# Patient Record
Sex: Female | Born: 1950 | Race: White | Hispanic: No | Marital: Married | Smoking: Never smoker
Health system: Southern US, Community
[De-identification: ages and names within clinical notes are randomized; demographics above are authoritative.]

## PROBLEM LIST (undated history)

## (undated) DIAGNOSIS — Z9221 Personal history of antineoplastic chemotherapy: Secondary | ICD-10-CM

## (undated) DIAGNOSIS — Z923 Personal history of irradiation: Secondary | ICD-10-CM

## (undated) DIAGNOSIS — Z9289 Personal history of other medical treatment: Secondary | ICD-10-CM

## (undated) DIAGNOSIS — K611 Rectal abscess: Secondary | ICD-10-CM

## (undated) DIAGNOSIS — H269 Unspecified cataract: Secondary | ICD-10-CM

## (undated) DIAGNOSIS — E119 Type 2 diabetes mellitus without complications: Secondary | ICD-10-CM

## (undated) DIAGNOSIS — R2689 Other abnormalities of gait and mobility: Secondary | ICD-10-CM

## (undated) DIAGNOSIS — I219 Acute myocardial infarction, unspecified: Secondary | ICD-10-CM

## (undated) DIAGNOSIS — R55 Syncope and collapse: Secondary | ICD-10-CM

## (undated) DIAGNOSIS — J90 Pleural effusion, not elsewhere classified: Secondary | ICD-10-CM

## (undated) DIAGNOSIS — G250 Essential tremor: Secondary | ICD-10-CM

## (undated) DIAGNOSIS — M1990 Unspecified osteoarthritis, unspecified site: Secondary | ICD-10-CM

## (undated) DIAGNOSIS — N390 Urinary tract infection, site not specified: Secondary | ICD-10-CM

## (undated) DIAGNOSIS — E7800 Pure hypercholesterolemia, unspecified: Secondary | ICD-10-CM

## (undated) DIAGNOSIS — I10 Essential (primary) hypertension: Secondary | ICD-10-CM

## (undated) DIAGNOSIS — H53459 Other localized visual field defect, unspecified eye: Secondary | ICD-10-CM

## (undated) DIAGNOSIS — J189 Pneumonia, unspecified organism: Secondary | ICD-10-CM

## (undated) DIAGNOSIS — N2889 Other specified disorders of kidney and ureter: Secondary | ICD-10-CM

## (undated) DIAGNOSIS — C50919 Malignant neoplasm of unspecified site of unspecified female breast: Secondary | ICD-10-CM

## (undated) DIAGNOSIS — G4700 Insomnia, unspecified: Secondary | ICD-10-CM

## (undated) DIAGNOSIS — I519 Heart disease, unspecified: Secondary | ICD-10-CM

## (undated) DIAGNOSIS — I2510 Atherosclerotic heart disease of native coronary artery without angina pectoris: Secondary | ICD-10-CM

## (undated) DIAGNOSIS — I701 Atherosclerosis of renal artery: Secondary | ICD-10-CM

## (undated) DIAGNOSIS — N870 Mild cervical dysplasia: Secondary | ICD-10-CM

## (undated) DIAGNOSIS — K219 Gastro-esophageal reflux disease without esophagitis: Secondary | ICD-10-CM

## (undated) DIAGNOSIS — I639 Cerebral infarction, unspecified: Secondary | ICD-10-CM

## (undated) DIAGNOSIS — I509 Heart failure, unspecified: Secondary | ICD-10-CM

## (undated) HISTORY — DX: Other specified disorders of kidney and ureter: N28.89

## (undated) HISTORY — DX: Atherosclerosis of renal artery: I70.1

## (undated) HISTORY — PX: CERVICAL BIOPSY  W/ LOOP ELECTRODE EXCISION: SUR135

## (undated) HISTORY — DX: Essential tremor: G25.0

## (undated) HISTORY — PX: CATARACT EXTRACTION: SUR2

## (undated) HISTORY — PX: BACK SURGERY: SHX140

## (undated) HISTORY — DX: Heart disease, unspecified: I51.9

## (undated) HISTORY — DX: Malignant neoplasm of unspecified site of unspecified female breast: C50.919

## (undated) HISTORY — PX: EYE SURGERY: SHX253

## (undated) HISTORY — DX: Acute myocardial infarction, unspecified: I21.9

## (undated) HISTORY — DX: Pleural effusion, not elsewhere classified: J90

## (undated) HISTORY — DX: Syncope and collapse: R55

## (undated) HISTORY — DX: Urinary tract infection, site not specified: N39.0

## (undated) HISTORY — DX: Personal history of other medical treatment: Z92.89

## (undated) HISTORY — DX: Rectal abscess: K61.1

## (undated) HISTORY — DX: Unspecified cataract: H26.9

## (undated) HISTORY — DX: Pneumonia, unspecified organism: J18.9

## (undated) HISTORY — DX: Personal history of irradiation: Z92.3

## (undated) HISTORY — PX: CORONARY ANGIOPLASTY WITH STENT PLACEMENT: SHX49

## (undated) HISTORY — PX: THORACENTESIS: SHX235

## (undated) HISTORY — DX: Insomnia, unspecified: G47.00

## (undated) HISTORY — DX: Mild cervical dysplasia: N87.0

---

## 1998-10-11 ENCOUNTER — Ambulatory Visit (HOSPITAL_COMMUNITY): Admission: RE | Admit: 1998-10-11 | Discharge: 1998-10-11 | Payer: Self-pay | Admitting: Obstetrics and Gynecology

## 1999-03-09 ENCOUNTER — Encounter: Payer: Self-pay | Admitting: Orthopedic Surgery

## 1999-03-14 ENCOUNTER — Observation Stay (HOSPITAL_COMMUNITY): Admission: RE | Admit: 1999-03-14 | Discharge: 1999-03-15 | Payer: Self-pay | Admitting: Orthopedic Surgery

## 1999-03-14 ENCOUNTER — Encounter: Payer: Self-pay | Admitting: Orthopedic Surgery

## 2000-05-30 ENCOUNTER — Encounter: Payer: Self-pay | Admitting: Family Medicine

## 2000-05-30 ENCOUNTER — Encounter: Admission: RE | Admit: 2000-05-30 | Discharge: 2000-05-30 | Payer: Self-pay | Admitting: Family Medicine

## 2001-01-30 ENCOUNTER — Other Ambulatory Visit: Admission: RE | Admit: 2001-01-30 | Discharge: 2001-01-30 | Payer: Self-pay | Admitting: Obstetrics and Gynecology

## 2001-01-30 ENCOUNTER — Encounter (INDEPENDENT_AMBULATORY_CARE_PROVIDER_SITE_OTHER): Payer: Self-pay | Admitting: Specialist

## 2001-02-28 ENCOUNTER — Encounter: Payer: Self-pay | Admitting: Obstetrics and Gynecology

## 2001-03-07 ENCOUNTER — Ambulatory Visit (HOSPITAL_COMMUNITY): Admission: RE | Admit: 2001-03-07 | Discharge: 2001-03-07 | Payer: Self-pay | Admitting: Obstetrics and Gynecology

## 2001-03-07 ENCOUNTER — Encounter (INDEPENDENT_AMBULATORY_CARE_PROVIDER_SITE_OTHER): Payer: Self-pay | Admitting: Specialist

## 2001-07-18 ENCOUNTER — Emergency Department (HOSPITAL_COMMUNITY): Admission: EM | Admit: 2001-07-18 | Discharge: 2001-07-19 | Payer: Self-pay | Admitting: Emergency Medicine

## 2001-08-07 ENCOUNTER — Encounter: Payer: Self-pay | Admitting: Family Medicine

## 2001-08-07 ENCOUNTER — Encounter: Admission: RE | Admit: 2001-08-07 | Discharge: 2001-08-07 | Payer: Self-pay | Admitting: Family Medicine

## 2001-12-11 ENCOUNTER — Other Ambulatory Visit: Admission: RE | Admit: 2001-12-11 | Discharge: 2001-12-11 | Payer: Self-pay | Admitting: Family Medicine

## 2002-08-14 ENCOUNTER — Encounter: Admission: RE | Admit: 2002-08-14 | Discharge: 2002-08-14 | Payer: Self-pay | Admitting: Family Medicine

## 2002-08-14 ENCOUNTER — Encounter: Payer: Self-pay | Admitting: Family Medicine

## 2002-08-25 ENCOUNTER — Other Ambulatory Visit: Admission: RE | Admit: 2002-08-25 | Discharge: 2002-08-25 | Payer: Self-pay | Admitting: Obstetrics and Gynecology

## 2003-08-07 ENCOUNTER — Encounter: Admission: RE | Admit: 2003-08-07 | Discharge: 2003-08-20 | Payer: Self-pay | Admitting: Neurosurgery

## 2003-08-25 ENCOUNTER — Encounter: Admission: RE | Admit: 2003-08-25 | Discharge: 2003-08-25 | Payer: Self-pay | Admitting: Neurosurgery

## 2004-08-30 ENCOUNTER — Encounter: Admission: RE | Admit: 2004-08-30 | Discharge: 2004-08-30 | Payer: Self-pay | Admitting: Family Medicine

## 2004-09-20 ENCOUNTER — Other Ambulatory Visit: Admission: RE | Admit: 2004-09-20 | Discharge: 2004-09-20 | Payer: Self-pay | Admitting: Family Medicine

## 2007-04-03 ENCOUNTER — Encounter: Admission: RE | Admit: 2007-04-03 | Discharge: 2007-04-03 | Payer: Self-pay | Admitting: Family Medicine

## 2007-06-25 ENCOUNTER — Other Ambulatory Visit: Admission: RE | Admit: 2007-06-25 | Discharge: 2007-06-25 | Payer: Self-pay | Admitting: Family Medicine

## 2009-10-25 ENCOUNTER — Emergency Department (HOSPITAL_COMMUNITY): Admission: EM | Admit: 2009-10-25 | Discharge: 2009-10-25 | Payer: Self-pay | Admitting: Emergency Medicine

## 2011-01-30 LAB — CBC
HCT: 47.5 % — ABNORMAL HIGH (ref 36.0–46.0)
Hemoglobin: 16.4 g/dL — ABNORMAL HIGH (ref 12.0–15.0)
MCHC: 34.6 g/dL (ref 30.0–36.0)
MCV: 85.7 fL (ref 78.0–100.0)
Platelets: 188 10*3/uL (ref 150–400)
RBC: 5.54 MIL/uL — ABNORMAL HIGH (ref 3.87–5.11)
RDW: 12.1 % (ref 11.5–15.5)
WBC: 6 10*3/uL (ref 4.0–10.5)

## 2011-01-30 LAB — URINALYSIS, ROUTINE W REFLEX MICROSCOPIC
Bilirubin Urine: NEGATIVE
Glucose, UA: >1000 mg/dL — AB
Hgb urine dipstick: NEGATIVE
Ketones, ur: >80 mg/dL — AB
Leukocytes, UA: NEGATIVE
Nitrite: POSITIVE — AB
Protein, ur: 100 mg/dL — AB
Specific Gravity, Urine: 1.044 — ABNORMAL HIGH (ref 1.005–1.030)
Urobilinogen, UA: 0.2 mg/dL (ref 0.0–1.0)
pH: 6 (ref 5.0–8.0)

## 2011-01-30 LAB — BASIC METABOLIC PANEL
BUN: 9 mg/dL (ref 6–23)
CO2: 23 mEq/L (ref 19–32)
Calcium: 8.9 mg/dL (ref 8.4–10.5)
Chloride: 103 mEq/L (ref 96–112)
Creatinine, Ser: 0.5 mg/dL (ref 0.4–1.2)
GFR calc Af Amer: >60 mL/min (ref >60)
GFR calc non Af Amer: >60 mL/min (ref >60)
Glucose, Bld: 335 mg/dL — ABNORMAL HIGH (ref 70–99)
Potassium: 3.6 mEq/L (ref 3.5–5.1)
Sodium: 136 mEq/L (ref 135–145)

## 2011-01-30 LAB — URINE MICROSCOPIC-ADD ON

## 2011-03-17 NOTE — Op Note (Signed)
[facility]  Patient:    Marisa Forbes, Marisa Forbes                     MRN: [id number] Proc. Date: 03/07/01 Adm. Date:  [id number] Attending:  Sharon Mt                           Operative Report  PREOPERATIVE DIAGNOSES:  Cervical intraepithelial neoplasia and abdominal mass.  POSTOPERATIVE DIAGNOSES:  Cervical intraepithelial neoplasia and abdominal mass.  OPERATION:  Excision of abdominal mass, LEEP procedure.  SURGEON:  Daniel L. Eda Paschal, M.D.  ANESTHESIA:  General.  FINDINGS:  The patient had a 3 cm abdominal mass.  When it was encountered at the time of laparotomy, it appeared to be most consistent with a lipoma.  It was superfascial, and the fascia did not have to be entered in order to remove it.  It appeared to be nicely encapsulated.  At the time of LEEP, the cervix was visualized through the colposcope and looked like it did in the office. The squamocolumnar junction could be seen.  There was white epithelium both on the anterior and posterior lip.  It did not extend deep into the canal.  It did extend slightly into the canal, but the top of the lesion could be seen. The entire transformation center could be well visualized.  DESCRIPTION OF PROCEDURE:  After adequate general endotracheal anesthesia, the patient was placed in the dorsal lithotomy position and prepped and draped in the usual sterile manner.  A transverse incision was made over the abdominal mass for about 3-4 cm and by putting pressure on the mass and dissecting around the mass, the mass could be separated from the subcutaneous tissue and was removed intact without spilling any of the mass.  It appeared to be most consistent with a lipoma, although it was not opened or examined in the operating room.  Copious irrigation was done with Ringers Lactate.  Several bleeders were identified and coagulated with the Bovie.  The subcutaneous tissue was approximated with  interrupteds of 3-0 plain, and then the skin was closed with staples.  The surgeon regloved; the patient was repositioned, and then colposcopy was done with 4% acetic acid.  The area where the LEEP needed to be done could be easily identified.  Epinephrine 1:200,000 and 2% Xylocaine was injected in all four quadrants, total of 8 cc was utilized, and then a LEEP was done with a 20 x 12 loop.  The depth was 7 mm.  An excellent specimen was obtained.  It was felt that the lesion might extend a little bit more inferiorly, and therefore a second specimen was obtained at 6 oclock.  Both were identified, pinned, and sent to pathology for tissue diagnosis.  The base was treated with the ball-tip cautery.  Settings for the cautery, both for the cutting part of the procedure and cauterization prior to procedure was 60 coag, 60 cut, blend 1.  At the termination of the procedure, there was no bleeding noted.  Monsels was added just to be sure.  The patient left the operating room in satisfactory condition. DD:  03/07/01 TD:  03/07/01 Job: [id number]

## 2011-12-22 ENCOUNTER — Other Ambulatory Visit: Payer: Self-pay | Admitting: Family Medicine

## 2012-06-12 LAB — HM DIABETES EYE EXAM

## 2012-09-07 ENCOUNTER — Inpatient Hospital Stay (HOSPITAL_COMMUNITY)
Admission: EM | Admit: 2012-09-07 | Discharge: 2012-09-13 | DRG: 246 | Disposition: A | Discharge status: Home / Self Care | Payer: No Typology Code available for payment source | Source: Other Acute Inpatient Hospital | Attending: Cardiovascular Disease | Admitting: Cardiovascular Disease

## 2012-09-07 ENCOUNTER — Encounter (HOSPITAL_COMMUNITY)
Admission: EM | Disposition: A | Discharge status: Home / Self Care | Payer: Self-pay | Source: Other Acute Inpatient Hospital | Attending: Cardiovascular Disease

## 2012-09-07 ENCOUNTER — Encounter (HOSPITAL_COMMUNITY): Payer: Self-pay | Admitting: Physician Assistant

## 2012-09-07 ENCOUNTER — Other Ambulatory Visit: Payer: Self-pay

## 2012-09-07 DIAGNOSIS — G250 Essential tremor: Secondary | ICD-10-CM | POA: Diagnosis present

## 2012-09-07 DIAGNOSIS — Z79899 Other long term (current) drug therapy: Secondary | ICD-10-CM

## 2012-09-07 DIAGNOSIS — I472 Ventricular tachycardia, unspecified: Secondary | ICD-10-CM | POA: Diagnosis not present

## 2012-09-07 DIAGNOSIS — E876 Hypokalemia: Secondary | ICD-10-CM | POA: Diagnosis not present

## 2012-09-07 DIAGNOSIS — G252 Other specified forms of tremor: Secondary | ICD-10-CM

## 2012-09-07 DIAGNOSIS — E119 Type 2 diabetes mellitus without complications: Secondary | ICD-10-CM | POA: Diagnosis present

## 2012-09-07 DIAGNOSIS — I213 ST elevation (STEMI) myocardial infarction of unspecified site: Secondary | ICD-10-CM

## 2012-09-07 DIAGNOSIS — I639 Cerebral infarction, unspecified: Secondary | ICD-10-CM

## 2012-09-07 DIAGNOSIS — I110 Hypertensive heart disease with heart failure: Secondary | ICD-10-CM | POA: Diagnosis present

## 2012-09-07 DIAGNOSIS — IMO0001 Reserved for inherently not codable concepts without codable children: Secondary | ICD-10-CM | POA: Diagnosis present

## 2012-09-07 DIAGNOSIS — I2510 Atherosclerotic heart disease of native coronary artery without angina pectoris: Secondary | ICD-10-CM | POA: Diagnosis present

## 2012-09-07 DIAGNOSIS — I219 Acute myocardial infarction, unspecified: Secondary | ICD-10-CM

## 2012-09-07 DIAGNOSIS — E1165 Type 2 diabetes mellitus with hyperglycemia: Secondary | ICD-10-CM

## 2012-09-07 DIAGNOSIS — Z8673 Personal history of transient ischemic attack (TIA), and cerebral infarction without residual deficits: Secondary | ICD-10-CM

## 2012-09-07 DIAGNOSIS — I10 Essential (primary) hypertension: Secondary | ICD-10-CM | POA: Diagnosis present

## 2012-09-07 DIAGNOSIS — I200 Unstable angina: Secondary | ICD-10-CM | POA: Diagnosis not present

## 2012-09-07 DIAGNOSIS — Z9114 Patient's other noncompliance with medication regimen: Secondary | ICD-10-CM

## 2012-09-07 DIAGNOSIS — Z9119 Patient's noncompliance with other medical treatment and regimen: Secondary | ICD-10-CM

## 2012-09-07 DIAGNOSIS — I6340 Cerebral infarction due to embolism of unspecified cerebral artery: Secondary | ICD-10-CM | POA: Diagnosis not present

## 2012-09-07 DIAGNOSIS — I4729 Other ventricular tachycardia: Secondary | ICD-10-CM | POA: Diagnosis not present

## 2012-09-07 DIAGNOSIS — I2109 ST elevation (STEMI) myocardial infarction involving other coronary artery of anterior wall: Principal | ICD-10-CM | POA: Diagnosis present

## 2012-09-07 DIAGNOSIS — Z91199 Patient's noncompliance with other medical treatment and regimen due to unspecified reason: Secondary | ICD-10-CM

## 2012-09-07 DIAGNOSIS — E785 Hyperlipidemia, unspecified: Secondary | ICD-10-CM | POA: Diagnosis present

## 2012-09-07 HISTORY — DX: Cerebral infarction, unspecified: I63.9

## 2012-09-07 HISTORY — PX: PERCUTANEOUS CORONARY STENT INTERVENTION (PCI-S): SHX5485

## 2012-09-07 HISTORY — DX: Type 2 diabetes mellitus without complications: E11.9

## 2012-09-07 HISTORY — DX: Acute myocardial infarction, unspecified: I21.9

## 2012-09-07 HISTORY — DX: Essential (primary) hypertension: I10

## 2012-09-07 HISTORY — PX: LEFT HEART CATHETERIZATION WITH CORONARY ANGIOGRAM: SHX5451

## 2012-09-07 LAB — GLUCOSE, CAPILLARY: Glucose-Capillary: 277 mg/dL — ABNORMAL HIGH (ref 70–99)

## 2012-09-07 LAB — MAGNESIUM: Magnesium: 1.9 mg/dL (ref 1.5–2.5)

## 2012-09-07 LAB — MRSA PCR SCREENING: MRSA by PCR: NEGATIVE

## 2012-09-07 SURGERY — LEFT HEART CATHETERIZATION WITH CORONARY ANGIOGRAM
Anesthesia: LOCAL

## 2012-09-07 MED ORDER — NITROGLYCERIN 0.2 MG/ML ON CALL CATH LAB
INTRAVENOUS | Status: AC
  Filled 2012-09-07: qty 1

## 2012-09-07 MED ORDER — ATORVASTATIN CALCIUM 80 MG PO TABS
80.0000 mg | ORAL_TABLET | Freq: Every day | ORAL | Status: DC
  Administered 2012-09-07 – 2012-09-13 (×7): 80 mg via ORAL
  Filled 2012-09-07 (×7): qty 1

## 2012-09-07 MED ORDER — NITROGLYCERIN 0.4 MG SL SUBL: 0.4000 mg | SUBLINGUAL_TABLET | SUBLINGUAL | Status: DC | PRN

## 2012-09-07 MED ORDER — PRASUGREL HCL 10 MG PO TABS
10.0000 mg | ORAL_TABLET | Freq: Every day | ORAL | Status: DC
  Administered 2012-09-08 – 2012-09-13 (×6): 10 mg via ORAL
  Filled 2012-09-07 (×6): qty 1

## 2012-09-07 MED ORDER — ASPIRIN EC 81 MG PO TBEC
81.0000 mg | DELAYED_RELEASE_TABLET | Freq: Every day | ORAL | Status: DC
  Administered 2012-09-07 – 2012-09-13 (×7): 81 mg via ORAL
  Filled 2012-09-07 (×7): qty 1

## 2012-09-07 MED ORDER — METOPROLOL TARTRATE 25 MG PO TABS
25.0000 mg | ORAL_TABLET | Freq: Four times a day (QID) | ORAL | Status: DC
  Administered 2012-09-07 – 2012-09-10 (×11): 25 mg via ORAL
  Filled 2012-09-07 (×16): qty 1

## 2012-09-07 MED ORDER — PRASUGREL HCL 10 MG PO TABS
ORAL_TABLET | ORAL | Status: AC
  Filled 2012-09-07: qty 6

## 2012-09-07 MED ORDER — LIDOCAINE HCL (PF) 1 % IJ SOLN
INTRAMUSCULAR | Status: AC
  Filled 2012-09-07: qty 30

## 2012-09-07 MED ORDER — ONDANSETRON HCL 4 MG/2ML IJ SOLN: 4.0000 mg | Freq: Four times a day (QID) | INTRAMUSCULAR | Status: DC | PRN

## 2012-09-07 MED ORDER — INSULIN ASPART 100 UNIT/ML ~~LOC~~ SOLN
0.0000 [IU] | Freq: Three times a day (TID) | SUBCUTANEOUS | Status: DC
  Administered 2012-09-08: 9 [IU] via SUBCUTANEOUS
  Administered 2012-09-08: 7 [IU] via SUBCUTANEOUS
  Administered 2012-09-08: 9 [IU] via SUBCUTANEOUS
  Administered 2012-09-08: 5 [IU] via SUBCUTANEOUS
  Administered 2012-09-09: 3 [IU] via SUBCUTANEOUS
  Administered 2012-09-09 (×2): 5 [IU] via SUBCUTANEOUS
  Administered 2012-09-10: 3 [IU] via SUBCUTANEOUS
  Administered 2012-09-10: 5 [IU] via SUBCUTANEOUS
  Administered 2012-09-10 – 2012-09-11 (×2): 3 [IU] via SUBCUTANEOUS
  Administered 2012-09-11: 2 [IU] via SUBCUTANEOUS
  Administered 2012-09-12 – 2012-09-13 (×6): 3 [IU] via SUBCUTANEOUS
  Filled 2012-09-07: qty 0.09
  Filled 2012-09-07: qty 3

## 2012-09-07 MED ORDER — FENTANYL CITRATE 0.05 MG/ML IJ SOLN
INTRAMUSCULAR | Status: AC
  Filled 2012-09-07: qty 2

## 2012-09-07 MED ORDER — ACETAMINOPHEN 325 MG PO TABS: 650.0000 mg | ORAL_TABLET | ORAL | Status: DC | PRN

## 2012-09-07 MED ORDER — SODIUM CHLORIDE 0.9 % IV SOLN
INTRAVENOUS | Status: DC
  Administered 2012-09-07 – 2012-09-08 (×3): via INTRAVENOUS

## 2012-09-07 MED ORDER — INFLUENZA VIRUS VACC SPLIT PF IM SUSP
0.5000 mL | INTRAMUSCULAR | Status: AC
  Administered 2012-09-08: 0.5 mL via INTRAMUSCULAR
  Filled 2012-09-07: qty 0.5

## 2012-09-07 MED ORDER — BIOTENE DRY MOUTH MT LIQD
15.0000 mL | Freq: Two times a day (BID) | OROMUCOSAL | Status: DC
  Administered 2012-09-07 – 2012-09-13 (×9): 15 mL via OROMUCOSAL

## 2012-09-07 MED ORDER — BIVALIRUDIN 250 MG IV SOLR
INTRAVENOUS | Status: AC
  Filled 2012-09-07: qty 250

## 2012-09-07 MED ORDER — SODIUM CHLORIDE 0.9 % IV SOLN
0.2500 mg/kg/h | INTRAVENOUS | Status: AC
  Filled 2012-09-07: qty 250

## 2012-09-07 MED ORDER — MIDAZOLAM HCL 2 MG/2ML IJ SOLN
INTRAMUSCULAR | Status: AC
  Filled 2012-09-07: qty 2

## 2012-09-07 MED ORDER — METOPROLOL TARTRATE 12.5 MG HALF TABLET: 12.5000 mg | ORAL_TABLET | Freq: Two times a day (BID) | ORAL | Status: DC

## 2012-09-07 MED ORDER — MORPHINE SULFATE 2 MG/ML IJ SOLN
2.0000 mg | INTRAMUSCULAR | Status: DC | PRN
  Administered 2012-09-09 (×3): 2 mg via INTRAVENOUS
  Filled 2012-09-07 (×3): qty 1

## 2012-09-07 MED ORDER — PRASUGREL HCL 10 MG PO TABS: 10.0000 mg | ORAL_TABLET | Freq: Every day | ORAL | Status: DC

## 2012-09-07 MED ORDER — POTASSIUM CHLORIDE CRYS ER 20 MEQ PO TBCR
40.0000 meq | EXTENDED_RELEASE_TABLET | Freq: Once | ORAL | Status: AC
  Administered 2012-09-07: 40 meq via ORAL
  Filled 2012-09-07: qty 2

## 2012-09-07 MED ORDER — NITROGLYCERIN IN D5W 200-5 MCG/ML-% IV SOLN
INTRAVENOUS | Status: AC
  Filled 2012-09-07: qty 250

## 2012-09-07 MED ORDER — NITROGLYCERIN IN D5W 200-5 MCG/ML-% IV SOLN
2.0000 ug/min | INTRAVENOUS | Status: DC
  Administered 2012-09-08: 50 ug/min via INTRAVENOUS
  Administered 2012-09-09: 30 ug/min via INTRAVENOUS
  Filled 2012-09-07 (×2): qty 250

## 2012-09-07 NOTE — CV Procedure (Signed)
Emergent Cardiac Catheterization/PCI of LAD in setting of Anterior STEMI  Marisa Forbes, 61 y.o., female  DICTATION # [phone] , [id number]  Ao: 162/92 LV: 162/11  LM: nl LAD: 100% occlusion after Dx1 and Septal1 with Timi 0 flow; 80% ostial and Mid Dx; once LAD opened diffuse 80 - 90% mid LAD stenosis in a small vessel LCX:  nl RCA: 50% mid, 80% distal before PDA takeoff  PTCA/Stent LAD at occluded site with 2.25 x 15 Xience DES stent to 2.30 mm; PTCA to diffusey diseased mid LAD with long infations up to 2.14 maximally, not stented with TIMI 3 flow returned.  EF: 45 - 50% with mild anterolateral hypokinesis. 20% L renal narrowing  DTB: 27 minutes from arrival to [facility] from [facility] well; continue angiomax at reduced dose, effient 60 mg given, IV and IC NTG.  Will need staged PCI.  Lennette Bihari, MD, [facility] 09/07/2012 5:57 PM

## 2012-09-07 NOTE — H&P (Signed)
Marisa Forbes is an 61 y.o. female.   Chief Complaint:  Chest Pain HPI:   The patient is a 61 yo female with a history of HTN, DM. She presented to [facility] medical center with CP that began at 0900hrs today.   She reported nausea but no vomiting.  EKG showed 5mm of ST elevation in anterior leads.  The patient requested to come to [facility].      No past medical history on file.  No past surgical history on file.  No family history on file. Social History:  does not have a smoking history on file. She does not have any smokeless tobacco history on file. Her alcohol and drug histories not on file.  Allergies: Allergies not on file  Medications Prior to Admission  Medication Sig Dispense Refill  . lisinopril-hydrochlorothiazide (PRINZIDE,ZESTORETIC) 20-25 MG per tablet TAKE 1 TABLET BY MOUTH DAILY  30 tablet  0  . metFORMIN (GLUCOPHAGE) 500 MG tablet TAKE 1 TABLET BY MOUTH TWICE DAILY  60 tablet  0  . pravastatin (PRAVACHOL) 40 MG tablet TAKE 1 TABLET DAILY  30 tablet  0    No results found for this or any previous visit (from the past 48 hour(s)). No results found.  Review of Systems  Cardiovascular: Positive for chest pain.  Gastrointestinal: Positive for nausea. Negative for vomiting.    There were no vitals taken for this visit. Physical Exam  Cardiovascular: Normal rate and regular rhythm.   No murmur heard. Pulses:      Dorsalis pedis pulses are 2+ on the right side, and 2+ on the left side.     Assessment/Plan Patient Active Hospital Problem List: STEMI (ST elevation myocardial infarction) (09/07/2012) HTN (hypertension) (09/07/2012)  Plan:  The patient was taken emergently to the cath lab.   HAGER, BRYAN 09/07/2012, 3:32 PM    Patient seen and examined. Agree with assessment and plan. 61 yo WF diabetic with 20 yr history of HTN admitted in transfer from [facility] with Anterior STEMI with up to 5 mm ST elevation in V1 and V2 and Q waves V1-4  as well inferior Q waves 3, avF.  Plan emergent cath.   Lennette Bihari, MD, [facility] 09/07/2012 5:29 PM

## 2012-09-08 ENCOUNTER — Other Ambulatory Visit: Payer: Self-pay

## 2012-09-08 LAB — HEMOGLOBIN A1C
Hgb A1c MFr Bld: 13.6 % — ABNORMAL HIGH (ref <5.7)
Mean Plasma Glucose: 344 mg/dL — ABNORMAL HIGH (ref <117)

## 2012-09-08 LAB — CBC
HCT: 43.5 % (ref 36.0–46.0)
Hemoglobin: 15.3 g/dL — ABNORMAL HIGH (ref 12.0–15.0)
MCH: 29.3 pg (ref 26.0–34.0)
MCHC: 35.2 g/dL (ref 30.0–36.0)
MCV: 83.3 fL (ref 78.0–100.0)
Platelets: 253 10*3/uL (ref 150–400)
RBC: 5.22 MIL/uL — ABNORMAL HIGH (ref 3.87–5.11)
RDW: 12.6 % (ref 11.5–15.5)
WBC: 15.1 10*3/uL — ABNORMAL HIGH (ref 4.0–10.5)

## 2012-09-08 LAB — BASIC METABOLIC PANEL
BUN: 17 mg/dL (ref 6–23)
CO2: 14 mEq/L — ABNORMAL LOW (ref 19–32)
Calcium: 8.9 mg/dL (ref 8.4–10.5)
Chloride: 107 mEq/L (ref 96–112)
Creatinine, Ser: 0.44 mg/dL — ABNORMAL LOW (ref 0.50–1.10)
GFR calc Af Amer: >90 mL/min (ref >90)
GFR calc non Af Amer: >90 mL/min (ref >90)
Glucose, Bld: 309 mg/dL — ABNORMAL HIGH (ref 70–99)
Potassium: 3.7 mEq/L (ref 3.5–5.1)
Sodium: 139 mEq/L (ref 135–145)

## 2012-09-08 LAB — POCT ACTIVATED CLOTTING TIME: Activated Clotting Time: 434 seconds

## 2012-09-08 LAB — LIPID PANEL
Cholesterol: 216 mg/dL — ABNORMAL HIGH (ref 0–200)
HDL: 60 mg/dL (ref >39)
LDL Cholesterol: 142 mg/dL — ABNORMAL HIGH (ref 0–99)
Total CHOL/HDL Ratio: 3.6 RATIO
Triglycerides: 68 mg/dL (ref <150)
VLDL: 14 mg/dL (ref 0–40)

## 2012-09-08 LAB — HEPARIN LEVEL (UNFRACTIONATED): Heparin Unfractionated: 0.18 IU/mL — ABNORMAL LOW (ref 0.30–0.70)

## 2012-09-08 LAB — CK TOTAL AND CKMB (NOT AT ARMC)
CK, MB: 122.4 ng/mL (ref 0.3–4.0)
Relative Index: 17.1 — ABNORMAL HIGH (ref 0.0–2.5)
Total CK: 717 U/L — ABNORMAL HIGH (ref 7–177)

## 2012-09-08 LAB — GLUCOSE, CAPILLARY
Glucose-Capillary: 272 mg/dL — ABNORMAL HIGH (ref 70–99)
Glucose-Capillary: 253 mg/dL — ABNORMAL HIGH (ref 70–99)
Glucose-Capillary: 336 mg/dL — ABNORMAL HIGH (ref 70–99)
Glucose-Capillary: 357 mg/dL — ABNORMAL HIGH (ref 70–99)
Glucose-Capillary: 353 mg/dL — ABNORMAL HIGH (ref 70–99)

## 2012-09-08 LAB — TSH: TSH: 0.369 u[IU]/mL (ref 0.350–4.500)

## 2012-09-08 LAB — TROPONIN I
Troponin I: >20 ng/mL (ref <0.30)
Troponin I: >20 ng/mL (ref <0.30)
Troponin I: >20 ng/mL (ref <0.30)

## 2012-09-08 MED ORDER — HEPARIN (PORCINE) IN NACL 100-0.45 UNIT/ML-% IJ SOLN
950.0000 [IU]/h | INTRAMUSCULAR | Status: DC
  Filled 2012-09-08 (×2): qty 250

## 2012-09-08 MED ORDER — HEPARIN (PORCINE) IN NACL 100-0.45 UNIT/ML-% IJ SOLN
750.0000 [IU]/h | INTRAMUSCULAR | Status: DC
  Administered 2012-09-08: 750 [IU]/h via INTRAVENOUS
  Filled 2012-09-08: qty 250

## 2012-09-08 MED ORDER — ATROPINE SULFATE 1 MG/ML IJ SOLN
INTRAMUSCULAR | Status: AC
  Filled 2012-09-08: qty 1

## 2012-09-08 MED ORDER — ZOLPIDEM TARTRATE 5 MG PO TABS: 10.0000 mg | ORAL_TABLET | Freq: Every evening | ORAL | Status: DC | PRN

## 2012-09-08 MED ORDER — LISINOPRIL 2.5 MG PO TABS
2.5000 mg | ORAL_TABLET | Freq: Every day | ORAL | Status: DC
  Administered 2012-09-08 – 2012-09-09 (×2): 2.5 mg via ORAL
  Filled 2012-09-08 (×3): qty 1

## 2012-09-08 MED ORDER — ALPRAZOLAM 0.25 MG PO TABS
0.2500 mg | ORAL_TABLET | Freq: Three times a day (TID) | ORAL | Status: DC | PRN
  Administered 2012-09-09: 0.25 mg via ORAL
  Filled 2012-09-08: qty 1

## 2012-09-08 MED ORDER — ISOSORBIDE MONONITRATE ER 60 MG PO TB24
60.0000 mg | ORAL_TABLET | Freq: Every day | ORAL | Status: DC
  Administered 2012-09-08 – 2012-09-13 (×6): 60 mg via ORAL
  Filled 2012-09-08 (×6): qty 1

## 2012-09-08 MED ORDER — TRAMADOL HCL 50 MG PO TABS
50.0000 mg | ORAL_TABLET | Freq: Four times a day (QID) | ORAL | Status: DC | PRN
  Administered 2012-09-10 – 2012-09-11 (×2): 50 mg via ORAL
  Filled 2012-09-08 (×3): qty 1

## 2012-09-08 NOTE — Cardiovascular Report (Signed)
NAMECERRIA, RONCHETTI NO.:  [id number]  MEDICAL RECORD NO.:  [id number]  LOCATION:  2911                         FACILITY:  [facility]  PHYSICIAN:  Nicki Guadalajara, M.D.     DATE OF BIRTH:  Feb 07, 1951  DATE OF PROCEDURE:  09/07/2012 DATE OF DISCHARGE:                           CARDIAC CATHETERIZATION   This is an emergent cardiac catheterization and percutaneous coronary intervention note.  Ms. Marisa Forbes is a 61 year old female, who has a history of diabetes mellitus and at least a 20-year history of hypertension.  She apparently presented to [facility] this afternoon after she had experienced a lower sternal, upper epigastric pain earlier this morning, which progressed to chest pain.  Upon arrival to [facility] she was noted to have ST-segment elevation, anterior wall myocardial infarction with up to 5 mm ST-segment elevation in leads V2, V3.  Of note, EKG also did show Q-waves in V1 through V4, as well as in lead III and F.  The patient requested transfer to [facility] rather than [place], and immediately upon arrival and transfer, was taken to the catheterization laboratory.  While in [place], she did receive 4000 units of IV heparin, 5 mg IV Lopressor in addition to 4 baby aspirin, was started on heparin drip and given nitroglycerin.  She now presents for acute catheterization.  PROCEDURE:  Upon arrival to [facility], she immediately was brought up to the catheterization laboratory without going through the emergency room. Right femoral artery was punctured anteriorly and a 6-French sheath was inserted without difficulty.  Diagnostic catheterization was done utilizing 6-French Judkins 4 left and right coronary catheters.  With a demonstration of total occlusion of the LAD proximally after the first diagonal and septal perforating artery with TIMI 0 flow, the decision was made to attempt acute intervention.  The patient was  started on Angiomax bolus plus infusion.  ACT was therapeutic.  She received 60 mg of oral Effient particularly since she is diabetic.  There was no history of TIA or prior stroke.  A 6-French XB 3.5 guide was used.  A Prowater wire was able to cross the total occlusion.  A 2.0 x 12 mm TREK balloon was initially used.  Once the total occlusion was opened, the LAD beyond it was diffusely diseased and very small caliber and had a typical diabetic small vessel look.  Additional inflations were made beyond the initial total occlusion with the 2.0 x 12 mm balloon, extending into the midportion of the vessel.  The balloon was then removed and exchanged for a 2.25 x 20 mm balloon with dilatation up to 2.1 mm in diffusely narrowed mid segment.  Dilatations were made up to 2- 2-1/2 minutes at this site.  It was felt that with the re-establishment of TIMI-3 flow, the this site should not be stented with a very long non- DES stent.  Consequently, the decision was made to place a 2.25 x 15 mm Xience Xpedition DES stent at the site of total occlusion, where the vessel was larger and not severely diffusely diseased and very small caliber.  This was dilated x2 up to 14 atmospheres.  A 2.5 x 12 mm noncompliant Sprinter  balloon was used for post stent dilatation up to 2.30 mm at the site.  With the demonstration of brisk TIMI-3 flow distally, even though the mid distal LAD was diffusely diseased.  The decision was made not to perform intervention on this today since the patient was pain-free and had stable hemodynamics.  The patient does have concomitant coronary artery disease involving her RCA as well as involving her diagonal vessel and will require a relook procedure with intervention to the RCA and probable diagonal, at which time, the mid distal LAD can be re-looked at, allowing time for stabilization.  A 6- French pigtail catheter was then inserted for left ventriculography. With the patient's  longstanding hypertensive history, distal aortography was also performed to assess her renal arteries.  During the procedure, the patient received numerous doses of intracoronary nitroglycerin down her LAD system.  Her IV nitroglycerin was titrated up to 50 mcg.  She was hypertensive at the beginning of the procedure, and this did improve.  The arterial sheath was sutured in place.  The plan is to continue the Angiomax infusion at reduced dose for several hours following the procedure.  She left the catheterization laboratory with stable hemodynamics and was pain free, and was transported to the coronary care unit for further observation and treatment.  HEMODYNAMIC DATA:  Following IC nitroglycerin, the central aortic pressure was 162/92.  Left ventricle pressure 162/11.  During the procedure, her blood pressure had risen to 190/105.  ANGIOGRAPHIC DATA:  Left main coronary artery was angiographically normal and bifurcated into an LAD and left circumflex system.  The LAD gave rise to a proximal diagonal vessel that had 80% ostial proximal narrowing and then also had another area of 80% narrowing in its mid segment.  Just after the diagonal arose from the LAD, the LAD gave rise to a moderate-sized septal perforating artery and after the septal perforating artery, the LAD was totally occluded with TIMI 0 flow.  The circumflex vessel was angiographically normal and gave rise to marginal vessels.  The right coronary artery was moderate-sized vessel that had 10-20% proximal area of narrowing, 50% mid lesion, and then probably 70-80% distal stenosis before the PDA, and the vessel gave rise to several small inferolateral and posterolateral branches.  Se did not have collateralization to the LAD system.  Following successful intervention to the LAD, the 100% occlusion was ultimately reduced to 0% with ultimate PTCA and insertion of a 2.25 x 15 mm Xience Xpedition DES stent, post dilated to  2.3 mm.  Initially once the total occlusion was opened, the LAD was diffusely diseased with narrowings of 80-90%.  This was dilated with long inflations with a 2.25 x 20-mm balloon up to approximately 2.14 mm with improvement in the stenosis from 80-90% diffusely to approximately 50%.  This was not stented today.  There was TIMI-3 flow down the LAD which was small caliber distally.  There was no change in the diagonal stenosis which was not intervened upon today.  Door-to-balloon time 27 minutes from arrival to [facility].  RAO ventriculography revealed an ejection fraction of approximately 45- 50%.  There was mild hypocontractility in the mid anterolateral wall.  Distal aortography revealed mild, smooth 20% narrowing in the left renal artery, but otherwise was free of significant aortoiliac disease.  IMPRESSION: 1. Acute ST-segment elevation anterior wall myocardial infarction with     the patient having 5 mm of initial ST-segment elevation     precordially secondary to total occlusion of the left  anterior     descending after the septal and diagonal vessel proximally on the     proximal to mid segment. 2. Normal circumflex system. 3. Moderate size right coronary artery with 50% mid and 70-80% distal     stenosis. 4. Successful acute PCI/stenting at the site of acute occlusion with a     100% stenosis being reduced to 0% with ultimate insertion of a 2.25     x 15 mm Xience V DES stent, post dilated to 2.3 mm, and diffuse     percutaneous transluminal coronary angioplasty of severely diseased     small caliber mid left anterior descending with narrowings of 80-     90% being reduced to approximately 50%, with resultant TIMI-3 flow     and evidence for 80% ostial and mid diagonal stenoses, not     intervened upon today.  Door-to-balloon time 27 minutes from     arrival to     [facility]. 5. Bivalirudin/Effient 60 mg/IC IV nitroglycerin in this patient who     had been  pretreated in [place] with aspirin and 4000 units of     IV heparin in addition to IV beta-blocker and nitrate treatment.          ______________________________ Nicki Guadalajara, M.D.     TK/MEDQ  D:  09/07/2012  T:  09/08/2012  Job:  [id number]

## 2012-09-08 NOTE — Progress Notes (Signed)
ANTICOAGULATION CONSULT NOTE - Initial Consult  Pharmacy Consult for heparin Indication: CAD  No Known Allergies  Patient Measurements: Height: 5\' 5"  (165.1 cm) Weight: 135 lb 9.3 oz (61.5 kg) IBW/kg (Calculated) : 57   Vital Signs: Temp: 98.6 F (37 C) (11/10 0000) Temp src: Oral (11/10 0000) BP: 134/72 mmHg (11/10 0400) Pulse Rate: 81  (11/10 0400)  Labs:  Basename 09/08/12 0418  HGB 15.3*  HCT 43.5  PLT 253  APTT --  LABPROT --  INR --  HEPARINUNFRC --  CREATININE 0.44*  CKTOTAL --  CKMB --  TROPONINI --    Estimated Creatinine Clearance: 66.5 ml/min (by C-G formula based on Cr of 0.44).   Medical History: Past Medical History  Diagnosis Date  . HTN (hypertension)   . Diabetes mellitus without complication     Medications:  Prescriptions prior to admission  Medication Sig Dispense Refill  . ibuprofen (ADVIL,MOTRIN) 200 MG tablet Take 200 mg by mouth every 6 (six) hours as needed. For pain       Scheduled:    . antiseptic oral rinse  15 mL Mouth Rinse BID  . aspirin EC  81 mg Oral Daily  . atorvastatin  80 mg Oral q1800  . [COMPLETED] bivalirudin      . [COMPLETED] fentaNYL      . influenza  inactive virus vaccine  0.5 mL Intramuscular Tomorrow-1000  . insulin aspart  0-9 Units Subcutaneous TID WC  . [COMPLETED] lidocaine      . metoprolol tartrate  25 mg Oral QID  . [COMPLETED] midazolam      . [COMPLETED] nitroGLYCERIN      . [COMPLETED] nitroGLYCERIN      . [COMPLETED] potassium chloride  40 mEq Oral Once  . [COMPLETED] prasugrel      . prasugrel  10 mg Oral Daily  . [DISCONTINUED] metoprolol tartrate  12.5 mg Oral BID  . [DISCONTINUED] prasugrel  10 mg Oral Daily   Infusions:    . sodium chloride 10 mL/hr at 09/08/12 0300  . [EXPIRED] bivalirudin (ANGIOMAX) infusion 5 mg/mL (Cath Lab,ACS,PCI indication) Stopped (09/08/12 0200)  . nitroGLYCERIN 55 mcg/min (09/07/12 2300)    Assessment: 61yo female s/p cath for STEMI, now awaiting  staged PCI, to begin heparin 6hr after sheath pulled (~0500); Angiomax was stopped at 0200.  Goal of Therapy:  Heparin level 0.3-0.7 units/ml Monitor platelets by anticoagulation protocol: Yes   Plan:  Will begin heparin gtt at 750 units/hr at 1100 this am and monitor heparin levels and CBC.  Colleen Can PharmD BCPS 09/08/2012,5:00 AM

## 2012-09-08 NOTE — Progress Notes (Signed)
[facility]  Subjective: CP resolved  Objective: Vital signs in last 24 hours: Temp:  [97.5 F (36.4 C)-98.6 F (37 C)] 98.3 F (36.8 C) (11/10 0730) Pulse Rate:  [68-90] 74  (11/10 0730) Resp:  [15-21] 19  (11/10 0730) BP: (122-155)/(62-90) 128/70 mmHg (11/10 0730) SpO2:  [94 %-100 %] 97 % (11/10 0730) Arterial Line BP: (131-188)/(67-103) 188/103 mmHg (11/10 0505) Weight:  [61.5 kg (135 lb 9.3 oz)-68 kg (149 lb 14.6 oz)] 61.9 kg (136 lb 7.4 oz) (11/10 0500) Last BM Date: 09/06/12  Intake/Output from previous day: 11/09 0701 - 11/10 0700 In: 1729.3 [P.O.:60; I.V.:1669.3] Out: 2250 [Urine:2250] Intake/Output this shift:    Medications Current Facility-Administered Medications  Medication Dose Route Frequency Provider Last Rate Last Dose  . 0.9 %  sodium chloride infusion   Intravenous Continuous Lennette Bihari, MD 125 mL/hr at 09/08/12 [phone]    . acetaminophen (TYLENOL) tablet 650 mg  650 mg Oral Q4H PRN Lennette Bihari, MD      . antiseptic oral rinse (BIOTENE) solution 15 mL  15 mL Mouth Rinse BID Lennette Bihari, MD   15 mL at 09/08/12 0759  . aspirin EC tablet 81 mg  81 mg Oral Daily Lennette Bihari, MD   81 mg at 09/07/12 1941  . atorvastatin (LIPITOR) tablet 80 mg  80 mg Oral q1800 [name], PA   80 mg at 09/07/12 1941  . [COMPLETED] bivalirudin (ANGIOMAX) 250 MG injection           . [EXPIRED] bivalirudin (ANGIOMAX) 5 mg/mL in sodium chloride 0.9 % 50 mL infusion  0.25 mg/kg/hr Intravenous Continuous Severiano Gilbert, PHARMD   0.25 mg/kg/hr at 09/07/12 2000  . [COMPLETED] fentaNYL (SUBLIMAZE) 0.05 MG/ML injection           . heparin ADULT infusion 100 units/mL (25000 units/250 mL)  750 Units/hr Intravenous Continuous Colleen Can, PHARMD      . influenza  inactive virus vaccine (FLUZONE/FLUARIX) injection 0.5 mL  0.5 mL Intramuscular Tomorrow-1000 Lennette Bihari, MD      . insulin aspart (novoLOG) injection 0-9 Units  0-9 Units  Subcutaneous TID WC [name], PA   5 Units at 09/08/12 0759  . [COMPLETED] lidocaine (XYLOCAINE) 1 % injection           . metoprolol tartrate (LOPRESSOR) tablet 25 mg  25 mg Oral QID Lennette Bihari, MD   25 mg at 09/08/12 0603  . [COMPLETED] midazolam (VERSED) 2 MG/2ML injection           . morphine 2 MG/ML injection 2 mg  2 mg Intravenous Q2H PRN Lennette Bihari, MD      . nitroGLYCERIN (NITROSTAT) SL tablet 0.4 mg  0.4 mg Sublingual Q5 Min x 3 PRN [name], PA      . [COMPLETED] nitroGLYCERIN (NTG ON-CALL) 0.2 mg/mL injection           . [COMPLETED] nitroGLYCERIN 0.2 mg/mL in dextrose 5 % infusion           . nitroGLYCERIN 0.2 mg/mL in dextrose 5 % infusion  2-200 mcg/min Intravenous Continuous Lennette Bihari, MD 15 mL/hr at 09/08/12 0621 50 mcg/min at 09/08/12 0621  . ondansetron (ZOFRAN) injection 4 mg  4 mg Intravenous Q6H PRN Lennette Bihari, MD      . [address] potassium chloride SA (K-DUR,KLOR-CON) CR tablet 40 mEq  40 mEq Oral Once [name], PA   40 mEq  at 09/07/12 1942  . [COMPLETED] prasugrel (EFFIENT) 10 MG tablet           . prasugrel (EFFIENT) tablet 10 mg  10 mg Oral Daily Lennette Bihari, MD      . [DISCONTINUED] acetaminophen (TYLENOL) tablet 650 mg  650 mg Oral Q4H PRN [name], PA      . [DISCONTINUED] atropine 1 MG/ML injection           . [DISCONTINUED] metoprolol tartrate (LOPRESSOR) tablet 12.5 mg  12.5 mg Oral BID [name], PA      . [DISCONTINUED] ondansetron [facility]) injection 4 mg  4 mg Intravenous Q6H PRN [name], PA      . [DISCONTINUED] prasugrel (EFFIENT) tablet 10 mg  10 mg Oral Daily Lennette Bihari, MD        PE: General appearance: alert, cooperative and no distress Lungs: clear to auscultation bilaterally Heart: regular rate and rhythm and 1/6 sys MM Abdomen: +BS, sonft nontender Extremities: No LEE Pulses: 2+ and symmetric Skin: Right groin:  Nontender.  No hemaotoma Neurologic: Grossly normal  Lab Results:   Basename 09/08/12  0418  WBC 15.1*  HGB 15.3*  HCT 43.5  PLT 253   BMET  Basename 09/08/12 0418  NA 139  K 3.7  CL 107  CO2 14*  GLUCOSE 309*  BUN 17  CREATININE 0.44*  CALCIUM 8.9   PT/INR No results found for this basename: LABPROT:3,INR:3 in the last 72 hours Cholesterol  Basename 09/08/12 0418  CHOL 216*   Lipid Panel     Component Value Date/Time   CHOL 216* 09/08/2012 0418   TRIG 68 09/08/2012 0418   HDL 60 09/08/2012 0418   CHOLHDL 3.6 09/08/2012 0418   VLDL 14 09/08/2012 0418   LDLCALC 142* 09/08/2012 0418   Cardiac Panel (last 3 results) No results found for this basename: CKTOTAL:3,CKMB:3,TROPONINI:3,RELINDX:3 in the last 72 hours  Studies/Results: Emergent Cardiac Catheterization/PCI of LAD in setting of Anterior STEMI  Marisa Forbes, 61 y.o., female  DICTATION # [phone] , [id number]  Ao: 162/92  LV: 162/11  LM: nl  LAD: 100% occlusion after Dx1 and Septal1 with Timi 0 flow; 80% ostial and Mid Dx; once LAD opened diffuse 80 - 90% mid LAD stenosis in a small vessel  LCX: nl  RCA: 50% mid, 80% distal before PDA takeoff  PTCA/Stent LAD at occluded site with 2.25 x 15 Xience DES stent to 2.30 mm;  PTCA to diffusely diseased mid LAD with long inflations up to 2.14 maximally, not stented with TIMI 3 flow returned.  EF: 45 - 50% with mild anterolateral hypokinesis.  20% L renal narrowing  DTB: 27 minutes from arrival to [facility] from [facility] well; continue angiomax at reduced dose, effient 60 mg given, IV and IC NTG.  Will need staged PCI.  Lennette Bihari, MD, [facility]   Assessment/Plan  Principal Problem:  *STEMI (ST elevation myocardial infarction) Active Problems:  HTN (hypertension)  Plan:  S/P emergent LHC resulting in PTCA and DES stent to LAD.  ASA, lipitor, Effient lopressor.  Will consider adding low dose ACE soon.   BP and HR stable.   EF 45-50%.  Will add 2D echo.  BP and HR stable.  Cycling troponin.   Will need staged PCI.   LOS: 1  day    HAGER, BRYAN 09/08/2012 8:25 AM    Patient seen and examined. Agree with assessment and plan. Feels better; no recurrent chest  Pain.  Successful stent placed to site of total LAD occlusion but with diffuse disease in small LAD beyond treated with initial PTCA.. Will need PCI to RCA later with probable PCI to Diagonal and relook. of mid distal LAD at that time. Hb A1c 13.6 needs aggressive diabetic management. Getting sliding scale insulin.   Will add low dose ACE-I. Start Imdur and begin IV NTG wean.   Lennette Bihari, MD, [facility] 09/08/2012 9:27 AM

## 2012-09-08 NOTE — Progress Notes (Deleted)
  Patient: Marisa Forbes / Admit Date: 09/07/2012 / Date of Encounter: 09/08/2012, 7:54 AM   Subjective  The patient states her chest pain is resolved at this time and has not been present since catheterization. She was explained multiple staging PCI to open more vessels. No questions. No nausea or vomiting at this time. No SOB or coughing.    Objective   Telemetry: NSR Physical Exam: Filed Vitals:   09/08/12 0730  BP: 128/70  Pulse: 74  Temp: 98.3 F (36.8 C)  Resp: 19   General: Well developed, well nourished, in no acute distress. Head: Normocephalic, atraumatic, sclera non-icteric, no xanthomas, nares are without discharge. Neck: Negative for carotid bruits. JVD not elevated. Lungs: Clear bilaterally to auscultation without wheezes, rales, or rhonchi. Breathing is unlabored.   Heart: RRR S1 S2 without murmurs, rubs, or gallops.  Abdomen: Soft, non-tender, non-distended with normoactive bowel sounds. No hepatomegaly. No rebound/guarding. No obvious abdominal masses. Msk:  Strength and tone appear normal for age. Extremities: No clubbing or cyanosis. No edema.  Distal pedal pulses are 2+ and equal bilaterally. Neuro: Alert and oriented X 3. Moves all extremities spontaneously. Psych:  Responds to questions appropriately with a normal affect.    Intake/Output Summary (Last 24 hours) at 09/08/12 0754 Last data filed at 09/08/12 0700  Gross per 24 hour  Intake 1729.25 ml  Output   2250 ml  Net -520.75 ml    Inpatient Medications:    . antiseptic oral rinse  15 mL Mouth Rinse BID  . aspirin EC  81 mg Oral Daily  . atorvastatin  80 mg Oral q1800  . [COMPLETED] bivalirudin      . [COMPLETED] fentaNYL      . influenza  inactive virus vaccine  0.5 mL Intramuscular Tomorrow-1000  . insulin aspart  0-9 Units Subcutaneous TID WC  . [COMPLETED] lidocaine      . metoprolol tartrate  25 mg Oral QID  . [COMPLETED] midazolam      . [COMPLETED] nitroGLYCERIN      . [COMPLETED]  nitroGLYCERIN      . [COMPLETED] potassium chloride  40 mEq Oral Once  . [COMPLETED] prasugrel      . prasugrel  10 mg Oral Daily  . [DISCONTINUED] metoprolol tartrate  12.5 mg Oral BID  . [DISCONTINUED] prasugrel  10 mg Oral Daily    Labs:  [facility] 09/08/12 0418 09/07/12 1840  NA 139 --  K 3.7 --  CL 107 --  CO2 14* --  GLUCOSE 309* --  BUN 17 --  CREATININE 0.44* --  CALCIUM 8.9 --  MG -- 1.9  PHOS -- --   Basename 09/08/12 0418  WBC 15.1*  NEUTROABS --  HGB 15.3*  HCT 43.5  MCV 83.3  PLT 253    Basename 09/07/12 1840  HGBA1C 13.6*    Basename 09/08/12 0418  CHOL 216*  HDL 60  LDLCALC 142*  TRIG 68  CHOLHDL 3.6    Radiology/Studies:  No results found.   Assessment and Plan  STEMI - S/P cath 11/9, stent to LAD with plan for staging PCI to other vessels later this week. Getting effient and angiomax with IV NTG. Will need statin and beta blocker prior to discharge. LDL 142  HTN - BP here 128/70 although will have to monitor.   DM type unknown - HGA1c 13.7 and will need tight control. DM educator consult. On SSI at this time.   Thelma Comp, ELIZABETH 09/08/2012, 8:03 AM PGY-2

## 2012-09-08 NOTE — Progress Notes (Signed)
Sheath pull after verifying that creatinine clearance >30.  R. Femoral site level Zero at beginning of pull.  6 FR Sheath dc'd intact from R. Groin with 2 RNs present-atropine at bedside.  Direct manual pressure held at site for 20 minutes until hemostatis obtained.  Pressure dressing applied.  R. Groin level 0.  Patient instructed to keep R. Leg straight and not to lift head, to call if she feels in pain or bleeding R. Groin.  Teach back per patient.  NTG increased to keep SBP <140.  No c/o CP/Sob.

## 2012-09-08 NOTE — Progress Notes (Signed)
ANTICOAGULATION CONSULT NOTE - Initial Consult  Pharmacy Consult for heparin Indication: CAD  No Known Allergies  Patient Measurements: Height: 5\' 5"  (165.1 cm) Weight: 136 lb 7.4 oz (61.9 kg) IBW/kg (Calculated) : 57   Vital Signs: Temp: 98.4 F (36.9 C) (11/10 1600) Temp src: Oral (11/10 1600) BP: 130/72 mmHg (11/10 1600) Pulse Rate: 81  (11/10 1600)  Labs:  Basename 09/08/12 1936 09/08/12 1454 09/08/12 0950 09/08/12 0847 09/08/12 0418  HGB -- -- -- -- 15.3*  HCT -- -- -- -- 43.5  PLT -- -- -- -- 253  APTT -- -- -- -- --  LABPROT -- -- -- -- --  INR -- -- -- -- --  HEPARINUNFRC 0.18* -- -- -- --  CREATININE -- -- -- -- 0.44*  CKTOTAL -- -- 717* -- --  CKMB -- -- 122.4* -- --  TROPONINI -- >20.00* -- >20.00* --    Estimated Creatinine Clearance: 66.5 ml/min (by C-G formula based on Cr of 0.44).   Medical History: Past Medical History  Diagnosis Date  . HTN (hypertension)   . Diabetes mellitus without complication    Assessment: 61yo female s/p cath for STEMI, now awaiting staged PCI, to begin heparin 6hr after sheath pulled (~0500); Angiomax was stopped at 0200. Initial heparin level was low at 0.18, no bleeding issues noted, no iv issues noted. Will increase heparin rate.  Goal of Therapy:  Heparin level 0.3-0.7 units/ml Monitor platelets by anticoagulation protocol: Yes   Plan:  Increase heparin gtt to 950 units/hr  Daily CBC/HL  Severiano Gilbert PharmD BCPS 09/08/2012,9:11 PM

## 2012-09-09 ENCOUNTER — Other Ambulatory Visit: Payer: Self-pay

## 2012-09-09 ENCOUNTER — Encounter (HOSPITAL_COMMUNITY)
Admission: EM | Disposition: A | Discharge status: Home / Self Care | Payer: Self-pay | Source: Other Acute Inpatient Hospital | Attending: Cardiovascular Disease

## 2012-09-09 DIAGNOSIS — Z9114 Patient's other noncompliance with medication regimen: Secondary | ICD-10-CM

## 2012-09-09 DIAGNOSIS — I2510 Atherosclerotic heart disease of native coronary artery without angina pectoris: Secondary | ICD-10-CM | POA: Diagnosis present

## 2012-09-09 DIAGNOSIS — E119 Type 2 diabetes mellitus without complications: Secondary | ICD-10-CM | POA: Diagnosis present

## 2012-09-09 DIAGNOSIS — I200 Unstable angina: Secondary | ICD-10-CM | POA: Diagnosis present

## 2012-09-09 DIAGNOSIS — I472 Ventricular tachycardia: Secondary | ICD-10-CM | POA: Diagnosis not present

## 2012-09-09 DIAGNOSIS — E785 Hyperlipidemia, unspecified: Secondary | ICD-10-CM | POA: Diagnosis present

## 2012-09-09 HISTORY — PX: LEFT HEART CATHETERIZATION WITH CORONARY ANGIOGRAM: SHX5451

## 2012-09-09 LAB — CBC
HCT: 39.2 % (ref 36.0–46.0)
Hemoglobin: 13.5 g/dL (ref 12.0–15.0)
MCH: 28.9 pg (ref 26.0–34.0)
MCHC: 34.4 g/dL (ref 30.0–36.0)
MCV: 83.9 fL (ref 78.0–100.0)
Platelets: 203 10*3/uL (ref 150–400)
RBC: 4.67 MIL/uL (ref 3.87–5.11)
RDW: 13 % (ref 11.5–15.5)
WBC: 10.8 10*3/uL — ABNORMAL HIGH (ref 4.0–10.5)

## 2012-09-09 LAB — POCT I-STAT, CHEM 8
BUN: 16 mg/dL (ref 6–23)
Calcium, Ion: 1.16 mmol/L (ref 1.13–1.30)
Chloride: 92 mEq/L — ABNORMAL LOW (ref 96–112)
Creatinine, Ser: 0.4 mg/dL — ABNORMAL LOW (ref 0.50–1.10)
Glucose, Bld: 258 mg/dL — ABNORMAL HIGH (ref 70–99)
HCT: 44 % (ref 36.0–46.0)
Hemoglobin: 15 g/dL (ref 12.0–15.0)
Potassium: 3 mEq/L — ABNORMAL LOW (ref 3.5–5.1)
Sodium: 124 mEq/L — ABNORMAL LOW (ref 135–145)
TCO2: 17 mmol/L (ref 0–100)

## 2012-09-09 LAB — GLUCOSE, CAPILLARY
Glucose-Capillary: 253 mg/dL — ABNORMAL HIGH (ref 70–99)
Glucose-Capillary: 232 mg/dL — ABNORMAL HIGH (ref 70–99)
Glucose-Capillary: 207 mg/dL — ABNORMAL HIGH (ref 70–99)
Glucose-Capillary: 254 mg/dL — ABNORMAL HIGH (ref 70–99)
Glucose-Capillary: 267 mg/dL — ABNORMAL HIGH (ref 70–99)

## 2012-09-09 LAB — BASIC METABOLIC PANEL
BUN: 11 mg/dL (ref 6–23)
CO2: 17 mEq/L — ABNORMAL LOW (ref 19–32)
Calcium: 8.5 mg/dL (ref 8.4–10.5)
Chloride: 106 mEq/L (ref 96–112)
Creatinine, Ser: 0.36 mg/dL — ABNORMAL LOW (ref 0.50–1.10)
GFR calc Af Amer: >90 mL/min (ref >90)
GFR calc non Af Amer: >90 mL/min (ref >90)
Glucose, Bld: 272 mg/dL — ABNORMAL HIGH (ref 70–99)
Potassium: 3.4 mEq/L — ABNORMAL LOW (ref 3.5–5.1)
Sodium: 137 mEq/L (ref 135–145)

## 2012-09-09 LAB — TROPONIN I
Troponin I: 12.86 ng/mL (ref <0.30)
Troponin I: 8.49 ng/mL (ref <0.30)
Troponin I: 4.42 ng/mL (ref <0.30)

## 2012-09-09 LAB — PROTIME-INR
INR: 1.09 (ref 0.00–1.49)
Prothrombin Time: 14 seconds (ref 11.6–15.2)

## 2012-09-09 LAB — HEPARIN LEVEL (UNFRACTIONATED): Heparin Unfractionated: 0.22 IU/mL — ABNORMAL LOW (ref 0.30–0.70)

## 2012-09-09 SURGERY — LEFT HEART CATHETERIZATION WITH CORONARY ANGIOGRAM

## 2012-09-09 MED ORDER — POTASSIUM CHLORIDE CRYS ER 20 MEQ PO TBCR
40.0000 meq | EXTENDED_RELEASE_TABLET | Freq: Once | ORAL | Status: AC
  Administered 2012-09-09: 40 meq via ORAL
  Filled 2012-09-09: qty 2

## 2012-09-09 MED ORDER — SODIUM CHLORIDE 0.9 % IJ SOLN
3.0000 mL | Freq: Two times a day (BID) | INTRAMUSCULAR | Status: DC
  Administered 2012-09-09 – 2012-09-13 (×6): 3 mL via INTRAVENOUS

## 2012-09-09 MED ORDER — SODIUM CHLORIDE 0.9 % IV SOLN: 1.0000 mL/kg/h | INTRAVENOUS | Status: AC

## 2012-09-09 MED ORDER — HEPARIN (PORCINE) IN NACL 2-0.9 UNIT/ML-% IJ SOLN
INTRAMUSCULAR | Status: AC
  Filled 2012-09-09: qty 1000

## 2012-09-09 MED ORDER — SODIUM CHLORIDE 0.9 % IJ SOLN: 3.0000 mL | INTRAMUSCULAR | Status: DC | PRN

## 2012-09-09 MED ORDER — DIAZEPAM 5 MG PO TABS
5.0000 mg | ORAL_TABLET | ORAL | Status: AC
  Administered 2012-09-09: 5 mg via ORAL
  Filled 2012-09-09: qty 1

## 2012-09-09 MED ORDER — NITROGLYCERIN 0.2 MG/ML ON CALL CATH LAB
INTRAVENOUS | Status: AC
  Filled 2012-09-09: qty 1

## 2012-09-09 MED ORDER — SODIUM CHLORIDE 0.9 % IV SOLN: 250.0000 mL | INTRAVENOUS | Status: DC

## 2012-09-09 MED ORDER — VERAPAMIL HCL 2.5 MG/ML IV SOLN
INTRAVENOUS | Status: AC
  Filled 2012-09-09: qty 2

## 2012-09-09 MED ORDER — LIDOCAINE HCL (PF) 1 % IJ SOLN
INTRAMUSCULAR | Status: AC
  Filled 2012-09-09: qty 30

## 2012-09-09 MED ORDER — HEPARIN (PORCINE) IN NACL 100-0.45 UNIT/ML-% IJ SOLN
1400.0000 [IU]/h | INTRAMUSCULAR | Status: DC
  Administered 2012-09-09: 1050 [IU]/h via INTRAVENOUS
  Administered 2012-09-10: 1400 [IU]/h via INTRAVENOUS
  Filled 2012-09-09 (×3): qty 250

## 2012-09-09 MED ORDER — RANOLAZINE ER 500 MG PO TB12
500.0000 mg | ORAL_TABLET | Freq: Two times a day (BID) | ORAL | Status: DC
  Administered 2012-09-09 – 2012-09-13 (×9): 500 mg via ORAL
  Filled 2012-09-09 (×10): qty 1

## 2012-09-09 MED ORDER — SODIUM CHLORIDE 0.9 % IV SOLN
INTRAVENOUS | Status: DC
  Administered 2012-09-09: 09:00:00 via INTRAVENOUS

## 2012-09-09 MED ORDER — FENTANYL CITRATE 0.05 MG/ML IJ SOLN
INTRAMUSCULAR | Status: AC
  Filled 2012-09-09: qty 2

## 2012-09-09 MED FILL — Insulin Aspart Inj 100 Unit/ML: SUBCUTANEOUS | Qty: 0.09 | Status: AC

## 2012-09-09 MED FILL — Dextrose Inj 5%: INTRAVENOUS | Qty: 50 | Status: AC

## 2012-09-09 MED FILL — Insulin Aspart Inj 100 Unit/ML: SUBCUTANEOUS | Qty: 0.07 | Status: AC

## 2012-09-09 MED FILL — Insulin Aspart Inj 100 Unit/ML: SUBCUTANEOUS | Qty: 0.05 | Status: AC

## 2012-09-09 MED FILL — Insulin Aspart Inj 100 Unit/ML: SUBCUTANEOUS | Qty: 0.03 | Status: AC

## 2012-09-09 NOTE — Progress Notes (Signed)
ANTICOAGULATION CONSULT NOTE - Follow Up Consult  Pharmacy Consult for Heparin Indication: chest pain/ACS  No Known Allergies  Patient Measurements: Height: 5\' 5"  (165.1 cm) Weight: 136 lb 7.4 oz (61.9 kg) IBW/kg (Calculated) : 57   Vital Signs: Temp: 98.1 F (36.7 C) (11/11 1120) Temp src: Oral (11/11 1120) BP: 133/65 mmHg (11/11 1200) Pulse Rate: 80  (11/11 1220)  Labs:  Basename 09/09/12 0901 09/09/12 0627 09/09/12 0545 09/08/12 2025 09/08/12 1936 09/08/12 1454 09/08/12 0950 09/08/12 0418  HGB -- 13.5 -- -- -- -- -- 15.3*  HCT -- 39.2 -- -- -- -- -- 43.5  PLT -- 203 -- -- -- -- -- 253  APTT -- -- -- -- -- -- -- --  LABPROT 14.0 -- -- -- -- -- -- --  INR 1.09 -- -- -- -- -- -- --  HEPARINUNFRC -- 0.22* -- -- 0.18* -- -- --  CREATININE -- 0.36* -- -- -- -- -- 0.44*  CKTOTAL -- -- -- -- -- -- 717* --  CKMB -- -- -- -- -- -- 122.4* --  TROPONINI -- -- 12.86* >20.00* -- >20.00* -- --    Estimated Creatinine Clearance: 66.5 ml/min (by C-G formula based on Cr of 0.36).   Medications:  Scheduled:    . antiseptic oral rinse  15 mL Mouth Rinse BID  . aspirin EC  81 mg Oral Daily  . atorvastatin  80 mg Oral q1800  . [COMPLETED] diazepam  5 mg Oral On Call  . [COMPLETED] fentaNYL      . [COMPLETED] heparin      . insulin aspart  0-9 Units Subcutaneous TID WC  . isosorbide mononitrate  60 mg Oral Daily  . [COMPLETED] lidocaine      . lisinopril  2.5 mg Oral Daily  . metoprolol tartrate  25 mg Oral QID  . [COMPLETED] nitroGLYCERIN      . [COMPLETED] potassium chloride  40 mEq Oral Once  . prasugrel  10 mg Oral Daily  . ranolazine  500 mg Oral BID  . sodium chloride  3 mL Intravenous Q12H  . [COMPLETED] verapamil        Assessment: Pt is a 61 y/o F s/p PCI with DES to LAD on 11/9. This am she developed BL arm pain and underwent another cardiac catheterization which showed stable post PCI anatomy. Pt was transferred back to CCU to continue current therapy. Pharmacy to  start UFH drip back 8 hours post sheath pull which was 11 am per RN. H/H/Plts stable. No evidence of bleeding reported. Renal function stable.   Goal of Therapy:  Heparin level 0.3-0.7 units/ml Monitor platelets by anticoagulation protocol: Yes   Plan:  - Restart heparin drip at 1050 units/hr at 1900 this evening - 6 hour heparin level at 0100 on 11/12 - Daily HL/CBC - Monitor for bleeding from cath site  Abran Duke, PharmD Clinical Pharmacist Phone: [phone] Pager: [phone] 09/09/2012 1:12 PM

## 2012-09-09 NOTE — Progress Notes (Signed)
Patient c/o pain in both arms 8/10.  No change in physical assessment or VS. Stat EKG done.  NTG drip increased to 62mcg/min.  Placed on O2 at 2l/m.  Patient anxious, given PRN 0.25mg  Xanax PO prn anxiety.  No change in symptoms.  Hinda Glatter, PAC notified of all parameters.  Orders rec'd.

## 2012-09-09 NOTE — Care Management Note (Addendum)
    Page 1 of 1   09/13/2012     4:38:58 PM   CARE MANAGEMENT NOTE 09/13/2012  Patient:  Marisa Forbes, Marisa Forbes   Account Number:  [id number]  Date Initiated:  09/09/2012  Documentation initiated by:  Junius Creamer  Subjective/Objective Assessment:   adm w mi     Action/Plan:   lives w fam   Anticipated DC Date:  09/14/2012   Anticipated DC Plan:  HOME/SELF CARE      DC Planning Services  CM consult      Choice offered to / List presented to:             Status of service:  Completed, signed off Medicare Important Message given?   (If response is "NO", the following Medicare IM given date fields will be blank) Date Medicare IM given:   Date Additional Medicare IM given:    Discharge Disposition:    Per UR Regulation:  Reviewed for med. necessity/level of care/duration of stay  If discussed at Long Length of Stay Meetings, dates discussed:    Comments:  09/13/12 [name],RN,BSN [phone] PT INQUIRED ABOUT COPAYS FOR INSULINS AT DC.  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  PT GIVEN COMMUNITY RX DISCOUNT CARD.  WILL CHECK FOR COUPONS AVAILABLE.  PT NOT ELIGIBLE FOR MEDICATION ASSISTANCE PROGRAMS, AS THESE NORMALLY RESERVED FOR THOSE WITH NO INSURANCE.  I EXPLAINED THIS TO PT AND HUSBAND, AND THEY VERB UNDERSTANDING.  11/11 11:50a debbie dowell rn,bsn [phone] gave pt effient 30day free card and copay assist card. pt has [employer] ins.pt has 50.00copay for effient.

## 2012-09-09 NOTE — Progress Notes (Signed)
Subjective:  Bilat  forearm  pain started last night. Initially relieved with MSO4 but recurred this am "6/10". EKG shows no acute change compared with admission EKG.  Objective:  Vital Signs in the last 24 hours: Temp:  [98.1 F (36.7 C)-98.5 F (36.9 C)] 98.5 F (36.9 C) (11/11 0730) Pulse Rate:  [70-93] 84  (11/11 0730) Resp:  [14-22] 18  (11/11 0730) BP: (110-156)/(55-89) 134/74 mmHg (11/11 0730) SpO2:  [95 %-99 %] 98 % (11/11 0730)  Intake/Output from previous day:  Intake/Output Summary (Last 24 hours) at 09/09/12 0805 Last data filed at 09/09/12 0400  Gross per 24 hour  Intake 1049.13 ml  Output   1800 ml  Net -750.87 ml    Physical Exam: General appearance: alert, cooperative and mild distress Lungs: clear to auscultation bilaterally Heart: regular rate and rhythm Rt groin without hematoma   Rate: 78  Rhythm: normal sinus rhythm  Lab Results:  Basename 09/09/12 0627 09/08/12 0418  WBC 10.8* 15.1*  HGB 13.5 15.3*  PLT 203 253    Basename 09/09/12 0627 09/08/12 0418  NA 137 139  K 3.4* 3.7  CL 106 107  CO2 17* 14*  GLUCOSE 272* 309*  BUN 11 17  CREATININE 0.36* 0.44*    Basename 09/08/12 2025 09/08/12 1454  TROPONINI >20.00* >20.00*   Hepatic Function Panel No results found for this basename: PROT,ALBUMIN,AST,ALT,ALKPHOS,BILITOT,BILIDIR,IBILI in the last 72 hours  Basename 09/08/12 0418  CHOL 216*   No results found for this basename: INR in the last 72 hours  Imaging: No results found.  Cardiac Studies:  Assessment/Plan:   Principal Problem:  *Unstable angina, recurrant arm pain 09/09/12 am Active Problems:  STEMI (ST elevation myocardial infarction)  CAD, urgent LAD DES 09/07/12, residual RCA and Dx disease, EF 45-50%  HTN (hypertension)  Diabetes type 2, uncontrolled  Dyslipidemia  Noncompliance with medications, on no medications "for some time"  NSVT, 5 beats 11/10  Plan- Will keep her NPO and review with MD, she may need to  go back to the lab today. Check Troponin.  Corine Shelter PA-C 09/09/2012, 8:05 AM  I seen and evaluated the patient this morning along with the PA/NP. I agree with their findings, examination as well as impression recommendations.  She notes Bilateral Arm pain that is not exactly similar to her MI Angina, however due to the relatively unstable coronary anatomy, Dr. Tresa Endo felt as though we should return to the Cath Lab to re-study her & potentially proceed with PCI of at least the Diagonal disease if the LAD is stable.  The plan had bee for staged PCI.  The procedure with Risks/Benefits/Alternatives and Indications was reviewed with the patient.  All questions were answered.    Risks / Complications include, but not limited to: Death, MI, CVA/TIA, VF/VT (with defibrillation), Bradycardia (need for temporary pacer placement), contrast induced nephropathy, bleeding / bruising / hematoma / pseudoaneurysm, vascular or coronary injury (with possible emergent CT or Vascular Surgery), adverse medication reactions, infection.    The patient voiced understanding and agree to proceed.   I have signed the consent form and placed it on the chart for patient signature and RN witness.     Marykay Lex, M.D., M.S. [facility] [address] [place], [place]  [address]  [phone]  09/09/2012 9:06 AM

## 2012-09-09 NOTE — CV Procedure (Signed)
[facility] PERCUTANEOUS CORONARY INTERVENTION REPORT  NAME:  LINZEE Forbes   MRN: [id number] DOB:  1951/06/14   ADMIT DATE: 09/07/2012  INTERVENTIONAL CARDIOLOGIST: Marykay Lex, M.D., MS PRIMARY CARE PROVIDER: No primary provider on file. PRIMARY CARDIOLOGIST: Lennette Bihari., MD  PATIENT:  Marisa Forbes is a 61 y.o. female who presented with Anterior STEMI on 09/07/12 when she was found to have a 100% occluded mid LAD with diffuse disease downstream.  PCI with 2.91mm DES stent to occlusion site & PTCA to downstream LAD.  Also noted to have Diag1 & RCA lesions.   Had been stable until this AM, when she developed severe bilateral arm pain.  Due to her existing disease & unstable nature of the LAD, Dr. Tresa Endo felt that she should undergo re-look cardiac catheterization.  PRE-OPERATIVE DIAGNOSIS:    Bilateral Arm pain - concern for post Infarct Angina  S/p PCI to LAD for Anterior STEMI 09/07/12  PROCEDURES PERFORMED:    Left Heart Catheterization with Native Coronary Angiography  Intracoronary Nitroglycerin Injection 200 mcg  x 2   PROCEDURE: Consent:Risks of procedure as well as the alternatives and risks of each were explained to the (patient/caregiver).  Consent for procedure obtained. Consent for signed by MD and patient with RN witness -- placed on chart.  PROCEDURE: The patient was brought to the 2nd Floor [facility] in the fasting state and prepped and draped in the usual sterile fashion for Right groin access. Sterile technique was used including antiseptics, cap, gloves, gown, hand hygiene, mask and sheet.  Skin prep: Chlorhexidine.  Time Out: Verified patient identification, verified procedure, site/side was marked, verified correct patient position, special equipment/implants available, medications/allergies/relevent history reviewed, required imaging and test results available.  Performed  Access: Right Common Femoral  Artery; 5 Fr Sheath -- fluoroscopically guided, Modified Seldinger technique Diagnostic:  Wires advanced & exchanged over J wire.  Right Coronary Artery Angiography:  Left Coronary Artery Angiography:  LV Hemodynamics (LV Gram):  Hemodynamics:  Central Aortic / Mean Pressures: 120/70 mmHg; 92 mmHg  Left Ventricular Pressures / EDP: 118/5 mmHg; 13 mmHg  Left Ventriculography: Not done  Coronary Anatomy: See drawing by Dr. Tresa Endo post PCI, scanned.  Left Main: Moderate caliber -> bifrucates to LAD & Circumflex LAD: Moderate caliber vessel with proximal Diag followed by patent stent.  The LAD beyond the stent tapers to a <22mm vessel with diffuse lesions 40-70% throughout, most notable at tiny D2 takeoff where there appears to be a small "shelf-like" lesion with what may be a small dissection flap.  TIMI 3 flow.  D1: Small caliber vessel with proximal & mid ~70-80% lesions in a ~1.5-2.0 mm vessel  D2: tiny vessel with diffuse disease Left Circumflex: Moderate to large caliber vessel that terminates as a bifurcating lateral OM, but has several small OMBs in the mid vessel. Only minimal luminal irregularities  RCA: Similar in appearance to STEMI angiography.  ~50% mid & ~70% distal pre PDA.  PDA & extensive RPL system with minimal luminal irregularites   ANESTHESIA:   Local Lidocaine 18 ml SEDATION:  25 mcg IV fentanyl  MEDICATIONS: Omnipaque contrast 40ml  Intracoronary NTG injection 200 mcg x 2  EBL:   < 5 ml  PATIENT DISPOSITION:    The patient was transferred to the PACU holding area in a hemodynamicaly stable, chest pain free condition.  Sheath to be removed in PACU/CCU.  The patient tolerated the procedure well, and there were  no complications.  The patient was stable before, during, and after the procedure.  POST-OPERATIVE DIAGNOSIS:    Stable post PCI anatomy with diffuse distal LAD disease & a suggestion of small dissection distal to tiny D2. Patent LAD stent with  TIMI 3 flow in all vessels.  Stable D2 lesions - in a 1.5-~2.0 mm vessel, unlikely cause of resting angina -- would defer to Dr. Tresa Endo, but not very good PCI targets.  Stable RCA lesion - non-flow limiting.  PLAN OF CARE:  Back to CCU & continue current Rx - wean NTG o/n to PO nitrate  Add Ranexa  Would strongly consider Optimization of Med Rx as opposed to staged PCI as least for now.  Would hope that the distal LAD flow will improve, but both the distal LAD & D1 lesions are borderline PCI targets & the RCA lesion is moderate to severe & not flow limiting.    Only consider PCI if true Angina occurs on Med Rx.   Marykay Lex, M.D., M.S. [facility] [address] [place], [place]  [address]  [phone]  09/09/2012 10:31 AM

## 2012-09-09 NOTE — Progress Notes (Signed)
Inpatient Diabetes Program Recommendations  AACE/ADA: New Consensus Statement on Inpatient Glycemic Control (2013)  Target Ranges:  Prepandial:   less than 140 mg/dL      Peak postprandial:   less than 180 mg/dL (1-2 hours)      Critically ill patients:  140 - 180 mg/dL   Reason for Visit: Hyperglycemia, Elevated HgbA1C  61yo female with a history of HTN, DM. She presented to [facility] with CP and transferred to [facility]. Home diabetes meds:  Metformin 500 bid    Results for NAOMI, CASTROGIOVANNI (MRN [id number]) as of 09/09/2012 15:24  Ref. Range 09/08/2012 21:36 09/09/2012 07:30 09/09/2012 10:38 09/09/2012 11:45  Glucose-Capillary Latest Range: 70-99 mg/dL 086 (H) 578 (H) 469 (H) 207 (H)     Inpatient Diabetes Program Recommendations Insulin - Basal: Consider adding basal insulin - Lantus 10 units QHS. Oral Agents: Consider tradjenta 5 mg QD Outpatient Referral: OP Diabetes Education consult for uncontrolled DM - HgbA1C - 13.6%  Note: Will continue to follow.

## 2012-09-10 DIAGNOSIS — Z9119 Patient's noncompliance with other medical treatment and regimen: Secondary | ICD-10-CM

## 2012-09-10 LAB — CBC
HCT: 41 % (ref 36.0–46.0)
Hemoglobin: 14.1 g/dL (ref 12.0–15.0)
MCH: 28.6 pg (ref 26.0–34.0)
MCHC: 34.4 g/dL (ref 30.0–36.0)
MCV: 83.2 fL (ref 78.0–100.0)
Platelets: 190 10*3/uL (ref 150–400)
RBC: 4.93 MIL/uL (ref 3.87–5.11)
RDW: 12.7 % (ref 11.5–15.5)
WBC: 10.2 10*3/uL (ref 4.0–10.5)

## 2012-09-10 LAB — TROPONIN I: Troponin I: 8.07 ng/mL (ref <0.30)

## 2012-09-10 LAB — BASIC METABOLIC PANEL
BUN: 9 mg/dL (ref 6–23)
CO2: 21 mEq/L (ref 19–32)
Calcium: 9.1 mg/dL (ref 8.4–10.5)
Chloride: 103 mEq/L (ref 96–112)
Creatinine, Ser: 0.41 mg/dL — ABNORMAL LOW (ref 0.50–1.10)
GFR calc Af Amer: >90 mL/min (ref >90)
GFR calc non Af Amer: >90 mL/min (ref >90)
Glucose, Bld: 307 mg/dL — ABNORMAL HIGH (ref 70–99)
Potassium: 3.6 mEq/L (ref 3.5–5.1)
Sodium: 138 mEq/L (ref 135–145)

## 2012-09-10 LAB — GLUCOSE, CAPILLARY
Glucose-Capillary: 224 mg/dL — ABNORMAL HIGH (ref 70–99)
Glucose-Capillary: 265 mg/dL — ABNORMAL HIGH (ref 70–99)
Glucose-Capillary: 245 mg/dL — ABNORMAL HIGH (ref 70–99)
Glucose-Capillary: 285 mg/dL — ABNORMAL HIGH (ref 70–99)

## 2012-09-10 LAB — HEPARIN LEVEL (UNFRACTIONATED)
Heparin Unfractionated: 0.21 IU/mL — ABNORMAL LOW (ref 0.30–0.70)
Heparin Unfractionated: 0.19 IU/mL — ABNORMAL LOW (ref 0.30–0.70)
Heparin Unfractionated: 0.4 IU/mL (ref 0.30–0.70)

## 2012-09-10 MED ORDER — INSULIN GLARGINE 100 UNIT/ML ~~LOC~~ SOLN
10.0000 [IU] | Freq: Every day | SUBCUTANEOUS | Status: DC
  Administered 2012-09-10: 10 [IU] via SUBCUTANEOUS

## 2012-09-10 MED ORDER — INSULIN ASPART 100 UNIT/ML ~~LOC~~ SOLN
4.0000 [IU] | Freq: Three times a day (TID) | SUBCUTANEOUS | Status: DC
  Administered 2012-09-11 (×2): 4 [IU] via SUBCUTANEOUS

## 2012-09-10 MED ORDER — LISINOPRIL 5 MG PO TABS
5.0000 mg | ORAL_TABLET | Freq: Every day | ORAL | Status: DC
  Administered 2012-09-10: 5 mg via ORAL
  Filled 2012-09-10 (×2): qty 1

## 2012-09-10 MED ORDER — METOPROLOL TARTRATE 50 MG PO TABS
50.0000 mg | ORAL_TABLET | Freq: Once | ORAL | Status: AC
  Administered 2012-09-10: 50 mg via ORAL
  Filled 2012-09-10: qty 1

## 2012-09-10 MED ORDER — INSULIN GLARGINE 100 UNIT/ML ~~LOC~~ SOLN
15.0000 [IU] | Freq: Every day | SUBCUTANEOUS | Status: DC
  Administered 2012-09-10: 15 [IU] via SUBCUTANEOUS

## 2012-09-10 MED ORDER — METOPROLOL TARTRATE 50 MG PO TABS
75.0000 mg | ORAL_TABLET | Freq: Two times a day (BID) | ORAL | Status: DC
  Administered 2012-09-10: 75 mg via ORAL
  Filled 2012-09-10 (×3): qty 1

## 2012-09-10 NOTE — Progress Notes (Signed)
1055 Cardiac Rehab Noted that trop is more elevated today along with HR and BP. Please advise when to begin ambulation.

## 2012-09-10 NOTE — Progress Notes (Signed)
Inpatient Diabetes Program Recommendations  AACE/ADA: New Consensus Statement on Inpatient Glycemic Control (2013)  Target Ranges:  Prepandial:   less than 140 mg/dL      Peak postprandial:   less than 180 mg/dL (1-2 hours)      Critically ill patients:  140 - 180 mg/dL   Reason for Visit: Hyperglycemia  Lantus started this am.  Results for Marisa Forbes, Marisa Forbes (MRN [id number]) as of 09/10/2012 17:22  Ref. Range 09/10/2012 07:55 09/10/2012 12:13 09/10/2012 17:12  Glucose-Capillary Latest Range: 70-99 mg/dL 621 (H) 308 (H) 657 (H)   Recommendation:  Consider adding meal coverage insulin - Novolog 3 units tidwc if pt eats >50% meal. Will need adjustment in diabetes meds prior to discharge. Would benefit from Outpatient Diabetes Education consult with HgbA1C of 13.6%.  Will continue to follow.

## 2012-09-10 NOTE — Progress Notes (Signed)
ANTICOAGULATION CONSULT NOTE - Follow Up Consult  Pharmacy Consult for Heparin Indication: chest pain/ACS  No Known Allergies  Patient Measurements: Height: 5\' 5"  (165.1 cm) Weight: 136 lb 7.4 oz (61.9 kg) IBW/kg (Calculated) : 57   Vital Signs: Temp: 98.4 F (36.9 C) (11/12 0800) Temp src: Oral (11/12 0800) BP: 149/76 mmHg (11/12 0900) Pulse Rate: 104  (11/12 0900)  Labs:  Basename 09/10/12 0840 09/10/12 0035 09/10/12 0034 09/09/12 1738 09/09/12 1217 09/09/12 0901 09/09/12 0627 09/08/12 0950 09/08/12 0418 09/07/12 1605  HGB 14.1 -- -- -- -- -- 13.5 -- -- --  HCT 41.0 -- -- -- -- -- 39.2 -- 43.5 --  PLT 190 -- -- -- -- -- 203 -- 253 --  APTT -- -- -- -- -- -- -- -- -- --  LABPROT -- -- -- -- -- 14.0 -- -- -- --  INR -- -- -- -- -- 1.09 -- -- -- --  HEPARINUNFRC 0.19* -- 0.21* -- -- -- 0.22* -- -- --  CREATININE -- -- -- -- -- -- 0.36* -- 0.44* 0.40*  CKTOTAL -- -- -- -- -- -- -- 717* -- --  CKMB -- -- -- -- -- -- -- 122.4* -- --  TROPONINI -- 8.07* -- 4.42* 8.49* -- -- -- -- --    Estimated Creatinine Clearance: 66.5 ml/min (by C-G formula based on Cr of 0.36).   Medications:  Scheduled:     . antiseptic oral rinse  15 mL Mouth Rinse BID  . aspirin EC  81 mg Oral Daily  . atorvastatin  80 mg Oral q1800  . insulin aspart  0-9 Units Subcutaneous TID WC  . insulin glargine  10 Units Subcutaneous Daily  . isosorbide mononitrate  60 mg Oral Daily  . lisinopril  5 mg Oral Daily  . [COMPLETED] metoprolol tartrate  50 mg Oral Once  . metoprolol tartrate  75 mg Oral BID  . prasugrel  10 mg Oral Daily  . ranolazine  500 mg Oral BID  . sodium chloride  3 mL Intravenous Q12H  . [DISCONTINUED] lisinopril  2.5 mg Oral Daily  . [DISCONTINUED] metoprolol tartrate  25 mg Oral QID    Assessment: Pt is a 61 y/o F s/p PCI with DES to LAD on 11/9. On 11/11 developed BL arm pain and underwent another cardiac catheterization which showed stable post PCI anatomy. Pt was transferred  back to CCU to continue current therapy. Pharmacy  started UFH drip back 8 hours post sheath pull. H/H/Plts stable. No evidence of bleeding reported. Renal function stable. 8 hours HL this am was 0.19 on 1200 units/hr.   Goal of Therapy:  Heparin level 0.3-0.7 units/ml Monitor platelets by anticoagulation protocol: Yes   Plan:  - Increase heparin drip to 1400 units/hr - 6 hour heparin level at 1800 today - Daily HL/CBC - Monitor for bleeding from cath site  Abran Duke, PharmD Clinical Pharmacist Phone: [phone] Pager: [phone] 09/10/2012 11:15 AM

## 2012-09-10 NOTE — Progress Notes (Signed)
ANTICOAGULATION CONSULT NOTE - Follow Up Consult  Pharmacy Consult for Heparin Indication: chest pain/ACS  No Known Allergies  Patient Measurements: Height: 5\' 5"  (165.1 cm) Weight: 136 lb 7.4 oz (61.9 kg) IBW/kg (Calculated) : 57   Vital Signs: Temp: 99.1 F (37.3 C) (11/12 1933) Temp src: Oral (11/12 1933) BP: 143/79 mmHg (11/12 1933) Pulse Rate: 104  (11/12 0900)  Labs:  Basename 09/10/12 1906 09/10/12 0840 09/10/12 0035 09/10/12 0034 09/09/12 1738 09/09/12 1217 09/09/12 0901 09/09/12 0627 09/08/12 0950 09/08/12 0418  HGB -- 14.1 -- -- -- -- -- 13.5 -- --  HCT -- 41.0 -- -- -- -- -- 39.2 -- 43.5  PLT -- 190 -- -- -- -- -- 203 -- 253  APTT -- -- -- -- -- -- -- -- -- --  LABPROT -- -- -- -- -- -- 14.0 -- -- --  INR -- -- -- -- -- -- 1.09 -- -- --  HEPARINUNFRC 0.40 0.19* -- 0.21* -- -- -- -- -- --  CREATININE -- 0.41* -- -- -- -- -- 0.36* -- 0.44*  CKTOTAL -- -- -- -- -- -- -- -- 717* --  CKMB -- -- -- -- -- -- -- -- 122.4* --  TROPONINI -- -- 8.07* -- 4.42* 8.49* -- -- -- --    Estimated Creatinine Clearance: 66.5 ml/min (by C-G formula based on Cr of 0.41).   Medications:  Scheduled:     . antiseptic oral rinse  15 mL Mouth Rinse BID  . aspirin EC  81 mg Oral Daily  . atorvastatin  80 mg Oral q1800  . insulin aspart  0-9 Units Subcutaneous TID WC  . insulin aspart  4 Units Subcutaneous TID WC  . insulin glargine  15 Units Subcutaneous QHS  . isosorbide mononitrate  60 mg Oral Daily  . lisinopril  5 mg Oral Daily  . [COMPLETED] metoprolol tartrate  50 mg Oral Once  . metoprolol tartrate  75 mg Oral BID  . prasugrel  10 mg Oral Daily  . ranolazine  500 mg Oral BID  . sodium chloride  3 mL Intravenous Q12H  . [DISCONTINUED] insulin glargine  10 Units Subcutaneous Daily  . [DISCONTINUED] lisinopril  2.5 mg Oral Daily  . [DISCONTINUED] metoprolol tartrate  25 mg Oral QID    Assessment: Pt is a 61 y/o F s/p PCI with DES to LAD on 11/9. On 11/11 developed BL  arm pain and underwent another cardiac catheterization which showed stable post PCI anatomy. Pt was transferred back to CCU to continue current therapy. Pharmacy  started UFH drip back 8 hours post sheath pull. H/H/Plts stable. No evidence of bleeding reported. Renal function stable.  Heparin level now 0.4 after rate increase to 1400 units/hr.    Goal of Therapy:  Heparin level 0.3-0.7 units/ml Monitor platelets by anticoagulation protocol: Yes   Plan:  - Continue heparin drip at 1400 units/hr - Daily HL/CBC - Monitor for bleeding from cath site  Nadara Mustard, PharmD., MS Clinical Pharmacist Pager:  [phone] Thank you for allowing pharmacy to be part of this patients care team. 09/10/2012 8:06 PM

## 2012-09-10 NOTE — Progress Notes (Signed)
CARDIAC REHAB PHASE I   PRE:  Rate/Rhythm: 87 SR  BP:  Supine: 145/86  Sitting:   Standing:    SaO2:   MODE:  Ambulation: 350 ft   POST:  Rate/Rhythem: 96 SR  BP:  Supine:   Sitting: 156/88  Standing:    SaO2:  1435-1515 Assisted X 1 and used walker to ambulate. Pt c/o of weakness and legs feeling wobbly. Gait steady with walker, slow pace. VS stable. Pt able to walk 350 feet without /o of cp or SOB.Pt back to bed after walk with call light in reach.  Beatrix Fetters

## 2012-09-10 NOTE — H&P (Signed)
Requesting physician: [facility]  Primary Care Physician: No primary provider on file.  Reason for consultation: Uncontrolled DM   History of Present Illness: Marisa Forbes is a 61/F with PMH of DM, HTN was admitted 11/9 with STEMI, s/p DES to LAD, her blood sugars have been uncontrolled for a while and she admits being on diabetic pills in past, Glucophage/glucotrol, but poor complaince with meds and was getting CBG and medical care very infrequently. Her husband reports that she has lost over 60lbs and they have been trying to eat healthier. During hospitalization found to have Hbaic of 13.6 and CBGs in 250-350 range  Allergies:  No Known Allergies    Past Medical History  Diagnosis Date  . HTN (hypertension)   . Diabetes mellitus without complication     History reviewed. No pertinent past surgical history.  Scheduled Meds:   . antiseptic oral rinse  15 mL Mouth Rinse BID  . aspirin EC  81 mg Oral Daily  . atorvastatin  80 mg Oral q1800  . insulin aspart  0-9 Units Subcutaneous TID WC  . insulin aspart  4 Units Subcutaneous TID WC  . insulin glargine  15 Units Subcutaneous QHS  . isosorbide mononitrate  60 mg Oral Daily  . lisinopril  5 mg Oral Daily  . [COMPLETED] metoprolol tartrate  50 mg Oral Once  . metoprolol tartrate  75 mg Oral BID  . prasugrel  10 mg Oral Daily  . ranolazine  500 mg Oral BID  . sodium chloride  3 mL Intravenous Q12H  . [DISCONTINUED] insulin glargine  10 Units Subcutaneous Daily  . [DISCONTINUED] lisinopril  2.5 mg Oral Daily  . [DISCONTINUED] metoprolol tartrate  25 mg Oral QID   Continuous Infusions:   . sodium chloride    . heparin 1,400 Units/hr (09/10/12 1527)  . nitroGLYCERIN Stopped (09/10/12 1200)  . [DISCONTINUED] heparin 1,200 Units/hr (09/10/12 0211)   PRN Meds:.acetaminophen, ALPRAZolam, morphine injection, nitroGLYCERIN, ondansetron (ZOFRAN) IV, sodium chloride, traMADol, zolpidem  Social History:  reports that she has never  smoked. She does not have any smokeless tobacco history on file. Her alcohol and drug histories not on file.  History reviewed. No pertinent family history.  Review of Systems:  Constitutional: Denies fever, chills, diaphoresis, appetite change and fatigue.  HEENT: Denies photophobia, eye pain, redness, hearing loss, ear pain, congestion, sore throat, rhinorrhea, sneezing, mouth sores, trouble swallowing, neck pain, neck stiffness and tinnitus.   Respiratory: Denies SOB, DOE, cough, chest tightness,  and wheezing.   Cardiovascular: Denies chest pain, palpitations and leg swelling.  Gastrointestinal: Denies nausea, vomiting, abdominal pain, diarrhea, constipation, blood in stool and abdominal distention.  Genitourinary: Denies dysuria, urgency, frequency, hematuria, flank pain and difficulty urinating.  Musculoskeletal: Denies myalgias, back pain, joint swelling, arthralgias and gait problem.  Skin: Denies pallor, rash and wound.  Neurological: Denies dizziness, seizures, syncope, weakness, light-headedness, numbness and headaches.  Hematological: Denies adenopathy. Easy bruising, personal or family bleeding history  Psychiatric/Behavioral: Denies suicidal ideation, mood changes, confusion, nervousness, sleep disturbance and agitation   Physical Exam: Blood pressure 156/88, pulse 104, temperature 99.2 F (37.3 C), temperature source Oral, resp. rate 16, height 5\' 5"  (1.651 m), weight 61.9 kg (136 lb 7.4 oz), SpO2 94.00%. General appearance: alert, cooperative and no distress  Lungs: clear to auscultation bilaterally  Heart: tachycardic, regular rhythm. No murmur  Extremities: no edema c/c Pulses: 2+ and symmetric  Skin: warm and dry. Right groin, no ecchymosis, no erythema, no bruits, non tender  Neurologic:  Grossly normal, twitching of head noted  Labs on Admission:  Results for orders placed during the hospital encounter of 09/07/12 (from the past 48 hour(s))  GLUCOSE, CAPILLARY      Status: Abnormal   Collection Time   09/08/12  5:18 PM      Component Value Range Comment   Glucose-Capillary 357 (*) 70 - 99 mg/dL   HEPARIN LEVEL (UNFRACTIONATED)     Status: Abnormal   Collection Time   09/08/12  7:36 PM      Component Value Range Comment   Heparin Unfractionated 0.18 (*) 0.30 - 0.70 IU/mL   TROPONIN I     Status: Abnormal   Collection Time   09/08/12  8:25 PM      Component Value Range Comment   Troponin I >20.00 (*) <0.30 ng/mL   GLUCOSE, CAPILLARY     Status: Abnormal   Collection Time   09/08/12  9:36 PM      Component Value Range Comment   Glucose-Capillary 353 (*) 70 - 99 mg/dL   TROPONIN I     Status: Abnormal   Collection Time   09/09/12  5:45 AM      Component Value Range Comment   Troponin I 12.86 (*) <0.30 ng/mL   HEPARIN LEVEL (UNFRACTIONATED)     Status: Abnormal   Collection Time   09/09/12  6:27 AM      Component Value Range Comment   Heparin Unfractionated 0.22 (*) 0.30 - 0.70 IU/mL   CBC     Status: Abnormal   Collection Time   09/09/12  6:27 AM      Component Value Range Comment   WBC 10.8 (*) 4.0 - 10.5 K/uL    RBC 4.67  3.87 - 5.11 MIL/uL    Hemoglobin 13.5  12.0 - 15.0 g/dL    HCT 16.1  09.6 - 04.5 %    MCV 83.9  78.0 - 100.0 fL    MCH 28.9  26.0 - 34.0 pg    MCHC 34.4  30.0 - 36.0 g/dL    RDW 40.9  81.1 - 91.4 %    Platelets 203  150 - 400 K/uL   BASIC METABOLIC PANEL     Status: Abnormal   Collection Time   09/09/12  6:27 AM      Component Value Range Comment   Sodium 137  135 - 145 mEq/L    Potassium 3.4 (*) 3.5 - 5.1 mEq/L    Chloride 106  96 - 112 mEq/L    CO2 17 (*) 19 - 32 mEq/L    Glucose, Bld 272 (*) 70 - 99 mg/dL    BUN 11  6 - 23 mg/dL    Creatinine, Ser 7.82 (*) 0.50 - 1.10 mg/dL    Calcium 8.5  8.4 - 95.6 mg/dL    GFR calc non Af Amer >90  >90 mL/min    GFR calc Af Amer >90  >90 mL/min   GLUCOSE, CAPILLARY     Status: Abnormal   Collection Time   09/09/12  7:30 AM      Component Value Range Comment    Glucose-Capillary 253 (*) 70 - 99 mg/dL   PROTIME-INR     Status: Normal   Collection Time   09/09/12  9:01 AM      Component Value Range Comment   Prothrombin Time 14.0  11.6 - 15.2 seconds    INR 1.09  0.00 - 1.49   GLUCOSE, CAPILLARY  Status: Abnormal   Collection Time   09/09/12 10:38 AM      Component Value Range Comment   Glucose-Capillary 232 (*) 70 - 99 mg/dL   GLUCOSE, CAPILLARY     Status: Abnormal   Collection Time   09/09/12 11:45 AM      Component Value Range Comment   Glucose-Capillary 207 (*) 70 - 99 mg/dL   TROPONIN I     Status: Abnormal   Collection Time   09/09/12 12:17 PM      Component Value Range Comment   Troponin I 8.49 (*) <0.30 ng/mL   GLUCOSE, CAPILLARY     Status: Abnormal   Collection Time   09/09/12  4:17 PM      Component Value Range Comment   Glucose-Capillary 254 (*) 70 - 99 mg/dL   TROPONIN I     Status: Abnormal   Collection Time   09/09/12  5:38 PM      Component Value Range Comment   Troponin I 4.42 (*) <0.30 ng/mL   GLUCOSE, CAPILLARY     Status: Abnormal   Collection Time   09/09/12 10:53 PM      Component Value Range Comment   Glucose-Capillary 267 (*) 70 - 99 mg/dL   HEPARIN LEVEL (UNFRACTIONATED)     Status: Abnormal   Collection Time   09/10/12 12:34 AM      Component Value Range Comment   Heparin Unfractionated 0.21 (*) 0.30 - 0.70 IU/mL   TROPONIN I     Status: Abnormal   Collection Time   09/10/12 12:35 AM      Component Value Range Comment   Troponin I 8.07 (*) <0.30 ng/mL   GLUCOSE, CAPILLARY     Status: Abnormal   Collection Time   09/10/12  7:55 AM      Component Value Range Comment   Glucose-Capillary 224 (*) 70 - 99 mg/dL   HEPARIN LEVEL (UNFRACTIONATED)     Status: Abnormal   Collection Time   09/10/12  8:40 AM      Component Value Range Comment   Heparin Unfractionated 0.19 (*) 0.30 - 0.70 IU/mL   BASIC METABOLIC PANEL     Status: Abnormal   Collection Time   09/10/12  8:40 AM      Component Value  Range Comment   Sodium 138  135 - 145 mEq/L    Potassium 3.6  3.5 - 5.1 mEq/L    Chloride 103  96 - 112 mEq/L    CO2 21  19 - 32 mEq/L    Glucose, Bld 307 (*) 70 - 99 mg/dL    BUN 9  6 - 23 mg/dL    Creatinine, Ser 1.61 (*) 0.50 - 1.10 mg/dL    Calcium 9.1  8.4 - 09.6 mg/dL    GFR calc non Af Amer >90  >90 mL/min    GFR calc Af Amer >90  >90 mL/min   CBC     Status: Normal   Collection Time   09/10/12  8:40 AM      Component Value Range Comment   WBC 10.2  4.0 - 10.5 K/uL    RBC 4.93  3.87 - 5.11 MIL/uL    Hemoglobin 14.1  12.0 - 15.0 g/dL    HCT 04.5  40.9 - 81.1 %    MCV 83.2  78.0 - 100.0 fL    MCH 28.6  26.0 - 34.0 pg    MCHC 34.4  30.0 - 36.0 g/dL  RDW 12.7  11.5 - 15.5 %    Platelets 190  150 - 400 K/uL   GLUCOSE, CAPILLARY     Status: Abnormal   Collection Time   09/10/12 12:13 PM      Component Value Range Comment   Glucose-Capillary 265 (*) 70 - 99 mg/dL     Radiological Exams on Admission: No results found.  Assessment/Plan  1. Uncontrolled DM: Hbaic 13.6 received lantus this am, will add lantus 15units tonight and add novolog 4Units TID with meals, will need further titration Insulin teaching per nursing Consult to Dietician Pt agreeable to taking Insulin at DC Lifestyle modification Add metformin 500mg  BID at DC  2. STEMI/CAD per cards  Thank you for the consult, we will follow up   Time Spent on Consultation:  Alexsandria Kivett [facility]  Pager:[phone] 09/10/2012, 5:08 PM

## 2012-09-10 NOTE — Progress Notes (Signed)
ANTICOAGULATION CONSULT NOTE - Follow Up Consult  Pharmacy Consult for heparin Indication: CAD  Labs:  Basename 09/10/12 0035 09/10/12 0034 09/09/12 1738 09/09/12 1217 09/09/12 0901 09/09/12 1610 09/08/12 1936 09/08/12 0950 09/08/12 0418 09/07/12 1605  HGB -- -- -- -- -- 13.5 -- -- 15.3* --  HCT -- -- -- -- -- 39.2 -- -- 43.5 44.0  PLT -- -- -- -- -- 203 -- -- 253 --  APTT -- -- -- -- -- -- -- -- -- --  LABPROT -- -- -- -- 14.0 -- -- -- -- --  INR -- -- -- -- 1.09 -- -- -- -- --  HEPARINUNFRC -- 0.21* -- -- -- 0.22* 0.18* -- -- --  CREATININE -- -- -- -- -- 0.36* -- -- 0.44* 0.40*  CKTOTAL -- -- -- -- -- -- -- 717* -- --  CKMB -- -- -- -- -- -- -- 122.4* -- --  TROPONINI 8.07* -- 4.42* 8.49* -- -- -- -- -- --    Assessment: 61yo female subtherapeutic on heparin after resumed post-cath at higher rate; of note staged PCI not planned unless optimized med therapy fails.  Goal of Therapy:  Heparin level 0.3-0.7 units/ml   Plan:  Will increase heparin gtt by ~2 units/kg/hr to 1200 units/hr and check level in 6hr.  Colleen Can PharmD BCPS 09/10/2012,2:05 AM

## 2012-09-10 NOTE — Progress Notes (Signed)
[facility]  Subjective: No complaints. Pt. Denies chest pain, SOB, orthopnea. Pt. Noted concern about head twitching which began 2 months ago. Feels tired -- Husband concerned about fatigue & her going back to work.  Objective: Vital signs in last 24 hours: Temp:  [97.9 F (36.6 C)-99.2 F (37.3 C)] 98.4 F (36.9 C) (11/12 0800) Pulse Rate:  [76-103] 94  (11/12 0800) Resp:  [16-17] 16  (11/12 0800) BP: (126-165)/(65-93) 158/93 mmHg (11/12 0800) SpO2:  [95 %-99 %] 98 % (11/12 0800) Last BM Date: 09/06/12  Intake/Output from previous day: 11/11 0701 - 11/12 0700 In: 1452.1 [P.O.:720; I.V.:732.1] Out: 2825 [Urine:2825] Intake/Output this shift: Total I/O In: 25 [I.V.:25] Out: -   Medications Current Facility-Administered Medications  Medication Dose Route Frequency Provider Last Rate Last Dose  . [EXPIRED] 0.9 %  sodium chloride infusion  1 mL/kg/hr Intravenous Continuous Marykay Lex, MD 61.9 mL/hr at 09/09/12 1130 1 mL/kg/hr at 09/09/12 1130  . 0.9 %  sodium chloride infusion  250 mL Intravenous Continuous Marykay Lex, MD      . acetaminophen (TYLENOL) tablet 650 mg  650 mg Oral Q4H PRN Lennette Bihari, MD      . ALPRAZolam Prudy Feeler) tablet 0.25 mg  0.25 mg Oral TID PRN Abelino Derrick, PA   0.25 mg at 09/09/12 0640  . antiseptic oral rinse (BIOTENE) solution 15 mL  15 mL Mouth Rinse BID Lennette Bihari, MD   15 mL at 09/09/12 2000  . aspirin EC tablet 81 mg  81 mg Oral Daily Lennette Bihari, MD   81 mg at 09/09/12 0909  . atorvastatin (LIPITOR) tablet 80 mg  80 mg Oral q1800 [name], PA   80 mg at 09/09/12 1746  . [COMPLETED] diazepam (VALIUM) tablet 5 mg  5 mg Oral On Call Abelino Derrick, PA   5 mg at 09/09/12 0909  . [COMPLETED] fentaNYL (SUBLIMAZE) 0.05 MG/ML injection           . [COMPLETED] heparin 2-0.9 UNIT/ML-% infusion           . heparin ADULT infusion 100 units/mL (25000 units/250 mL)  1,200 Units/hr Intravenous Continuous  Colleen Can, PHARMD 12 mL/hr at 09/10/12 0600 1,200 Units/hr at 09/10/12 0600  . insulin aspart (novoLOG) injection 0-9 Units  0-9 Units Subcutaneous TID WC [name], PA   3 Units at 09/10/12 0802  . isosorbide mononitrate (IMDUR) 24 hr tablet 60 mg  60 mg Oral Daily Lennette Bihari, MD   60 mg at 09/09/12 0908  . [COMPLETED] lidocaine (XYLOCAINE) 1 % injection           . lisinopril (PRINIVIL,ZESTRIL) tablet 2.5 mg  2.5 mg Oral Daily Lennette Bihari, MD   2.5 mg at 09/09/12 0909  . metoprolol tartrate (LOPRESSOR) tablet 25 mg  25 mg Oral QID Lennette Bihari, MD   25 mg at 09/10/12 0534  . morphine 2 MG/ML injection 2 mg  2 mg Intravenous Q2H PRN Lennette Bihari, MD   2 mg at 09/09/12 2331  . nitroGLYCERIN (NITROSTAT) SL tablet 0.4 mg  0.4 mg Sublingual Q5 Min x 3 PRN [name], PA      . [COMPLETED] nitroGLYCERIN (NTG ON-CALL) 0.2 mg/mL injection           . nitroGLYCERIN 0.2 mg/mL in dextrose 5 % infusion  2-200 mcg/min Intravenous Continuous Lennette Bihari, MD 3 mL/hr at 09/09/12 1800 10 mcg/min  at 09/09/12 1800  . ondansetron (ZOFRAN) injection 4 mg  4 mg Intravenous Q6H PRN Lennette Bihari, MD      . [COMPLETED] potassium chloride SA (K-DUR,KLOR-CON) CR tablet 40 mEq  40 mEq Oral Once Abelino Derrick, [place]   40 mEq at 09/09/12 0909  . prasugrel (EFFIENT) tablet 10 mg  10 mg Oral Daily Lennette Bihari, MD   10 mg at 09/09/12 0908  . ranolazine (RANEXA) 12 hr tablet 500 mg  500 mg Oral BID Marykay Lex, MD   500 mg at 09/09/12 2259  . sodium chloride 0.9 % injection 3 mL  3 mL Intravenous Q12H Marykay Lex, MD   3 mL at 09/09/12 2300  . sodium chloride 0.9 % injection 3 mL  3 mL Intravenous PRN Marykay Lex, MD      . traMADol Janean Sark) tablet 50 mg  50 mg Oral Q6H PRN Abelino Derrick, PA      . [COMPLETED] verapamil (ISOPTIN) 2.5 MG/ML injection           . zolpidem (AMBIEN) tablet 10 mg  10 mg Oral QHS PRN Abelino Derrick, PA      . [DISCONTINUED] 0.9 %  sodium chloride infusion    Intravenous Continuous Lennette Bihari, MD 125 mL/hr at 09/08/12 2334    . [DISCONTINUED] 0.9 %  sodium chloride infusion   Intravenous Continuous Eda Paschal Louann, [place] 75 mL/hr at 09/09/12 0848    . [DISCONTINUED] heparin ADULT infusion 100 units/mL (25000 units/250 mL)  950 Units/hr Intravenous Continuous Severiano Gilbert, PHARMD 12 mL/hr at 09/10/12 0211 1,200 Units/hr at 09/10/12 0211    PE: General appearance: alert, cooperative and no distress Lungs: clear to auscultation bilaterally Heart: fast rate. regular rhythm. No murmur Extremities: no LEE Pulses: 2+ and symmetric Skin: warm and dry. Right groin, no ecchymosis, no erythema, no bruits, non tender Neurologic: Grossly normal. Notable rotational head twitch  Lab Results:   Basename 09/09/12 0627 09/08/12 0418 09/07/12 1605  WBC 10.8* 15.1* --  HGB 13.5 15.3* 15.0  HCT 39.2 43.5 44.0  PLT 203 253 --   BMET  Basename 09/09/12 0627 09/08/12 0418 09/07/12 1605  NA 137 139 124*  K 3.4* 3.7 3.0*  CL 106 107 92*  CO2 17* 14* --  GLUCOSE 272* 309* 258*  BUN 11 17 16   CREATININE 0.36* 0.44* 0.40*  CALCIUM 8.5 8.9 --   PT/INR  Basename 09/09/12 0901  LABPROT 14.0  INR 1.09   Cholesterol  Basename 09/08/12 0418  CHOL 216*   Cardiac Enzymes No components found with this basename: TROPONIN:3, CKMB:3  Studies/Results: PATIENT DISPOSITION:  The patient was transferred to the PACU holding area in a hemodynamicaly stable, chest pain free condition.  Sheath to be removed in PACU/CCU.  The patient tolerated the procedure well, and there were no complications.  The patient was stable before, during, and after the procedure. POST-OPERATIVE DIAGNOSIS:  Stable post PCI anatomy with diffuse distal LAD disease & a suggestion of small dissection distal to tiny D2. Patent LAD stent with TIMI 3 flow in all vessels.  Stable D2 lesions - in a 1.5-~2.0 mm vessel, unlikely cause of resting angina -- would defer to Dr. Tresa Endo, but not very  good PCI targets.  Stable RCA lesion - non-flow limiting. PLAN OF CARE:  Back to CCU & continue current Rx - wean NTG o/n to PO nitrate  Add Ranexa  Would strongly consider Optimization of Med  Rx as opposed to staged PCI as least for now. Would hope that the distal LAD flow will improve, but both the distal LAD & D1 lesions are borderline PCI targets & the RCA lesion is moderate to severe & not flow limiting.  Only consider PCI if true Angina occurs on Med Rx. Marykay Lex, M.D., M.S.  @RISRSLT2 @  Assessment/Plan  Principal Problem:  *Unstable angina, recurrant arm pain 09/09/12 am Active Problems:  STEMI 09/07/12  HTN (hypertension)  Diabetes type 2, uncontrolled  Dyslipidemia  CAD, urgent LAD DES 09/07/12, residual RCA and Dx disease, EF 45-50%  Noncompliance with medications, on no medications "for some time"  NSVT, 5 beats 11/10  Plan:S/P relook cath yesterday. Plan for optimized medical management. Troponin increased from 4.42 to 8.07 today. Will continue to cycle.  BP poorly controlled. HR remains elevated in the low 100's. Will increase metoprolol to 75 mg BID and increase lisinopril to 5 mg PO daily.  Blood glucose levels elevated w/ HgbA1C 13.6.  Will consult general med for diabetes management. Will start Lantus 10 units QHS per diabetes coordinator. CBC and BMET pending.     LOS: 3 days    HAGER, BRYAN 09/10/2012 8:43 AM  I seen and evaluated the patient this morning along with [name], PA. I agree with his findings, examination as well as impression recommendations.  No further SSx of CP/arm pain.  Has yet to ambulate.    Agree with converting to PO Nitrate.  DM counseling -- horrible control & coronary arteries are diffusely diseased DM arteries.   Continue to titrate up ACE-I & BB dose for severe CAD & ICM with BP still elevated. (converted from 25 qid to 75mg  bid Metoprolol)  Will try to ambulate today to determine if she has any further angina.  Will  need to base further attempts of revascularization on her symptoms.  Will review films with Dr. Allyson Sabal to determine if we consider PCI on RCA +/- small D1.  My inclination is to ambulate & optimize meds -- PCI if angina while in-pateint.  If no angina -- OP Myoview to evaluate for angina & guide PCI based on evidence of ischemia.  If no angina with ambulation -- transfer to TCU.  Marykay Lex, M.D., M.S. [facility] [address] [place], [place]  [address]  [phone] Pager # [phone] 09/10/2012 11:17 AM

## 2012-09-11 ENCOUNTER — Encounter (HOSPITAL_COMMUNITY): Payer: Self-pay

## 2012-09-11 ENCOUNTER — Inpatient Hospital Stay (HOSPITAL_COMMUNITY): Payer: No Typology Code available for payment source

## 2012-09-11 DIAGNOSIS — G252 Other specified forms of tremor: Secondary | ICD-10-CM | POA: Diagnosis present

## 2012-09-11 DIAGNOSIS — I10 Essential (primary) hypertension: Secondary | ICD-10-CM

## 2012-09-11 DIAGNOSIS — R259 Unspecified abnormal involuntary movements: Secondary | ICD-10-CM

## 2012-09-11 LAB — GLUCOSE, CAPILLARY
Glucose-Capillary: 177 mg/dL — ABNORMAL HIGH (ref 70–99)
Glucose-Capillary: 237 mg/dL — ABNORMAL HIGH (ref 70–99)
Glucose-Capillary: 271 mg/dL — ABNORMAL HIGH (ref 70–99)
Glucose-Capillary: 201 mg/dL — ABNORMAL HIGH (ref 70–99)

## 2012-09-11 LAB — BASIC METABOLIC PANEL
BUN: 9 mg/dL (ref 6–23)
CO2: 22 mEq/L (ref 19–32)
Calcium: 8.5 mg/dL (ref 8.4–10.5)
Chloride: 102 mEq/L (ref 96–112)
Creatinine, Ser: 0.32 mg/dL — ABNORMAL LOW (ref 0.50–1.10)
GFR calc Af Amer: >90 mL/min (ref >90)
GFR calc non Af Amer: >90 mL/min (ref >90)
Glucose, Bld: 207 mg/dL — ABNORMAL HIGH (ref 70–99)
Potassium: 3.2 mEq/L — ABNORMAL LOW (ref 3.5–5.1)
Sodium: 136 mEq/L (ref 135–145)

## 2012-09-11 LAB — CBC
HCT: 38.8 % (ref 36.0–46.0)
Hemoglobin: 13.3 g/dL (ref 12.0–15.0)
MCH: 28.5 pg (ref 26.0–34.0)
MCHC: 34.3 g/dL (ref 30.0–36.0)
MCV: 83.3 fL (ref 78.0–100.0)
Platelets: 181 10*3/uL (ref 150–400)
RBC: 4.66 MIL/uL (ref 3.87–5.11)
RDW: 12.7 % (ref 11.5–15.5)
WBC: 8.9 10*3/uL (ref 4.0–10.5)

## 2012-09-11 LAB — HEPARIN LEVEL (UNFRACTIONATED): Heparin Unfractionated: 0.42 IU/mL (ref 0.30–0.70)

## 2012-09-11 LAB — VITAMIN B12: Vitamin B-12: 819 pg/mL (ref 211–911)

## 2012-09-11 MED ORDER — INSULIN ASPART 100 UNIT/ML ~~LOC~~ SOLN
6.0000 [IU] | Freq: Three times a day (TID) | SUBCUTANEOUS | Status: DC
  Administered 2012-09-11 – 2012-09-12 (×3): 6 [IU] via SUBCUTANEOUS

## 2012-09-11 MED ORDER — POTASSIUM CHLORIDE CRYS ER 20 MEQ PO TBCR
40.0000 meq | EXTENDED_RELEASE_TABLET | Freq: Once | ORAL | Status: AC
  Administered 2012-09-11: 40 meq via ORAL
  Filled 2012-09-11: qty 2

## 2012-09-11 MED ORDER — LISINOPRIL 10 MG PO TABS
10.0000 mg | ORAL_TABLET | Freq: Every day | ORAL | Status: DC
  Administered 2012-09-11 – 2012-09-13 (×3): 10 mg via ORAL
  Filled 2012-09-11 (×3): qty 1

## 2012-09-11 MED ORDER — METOPROLOL TARTRATE 100 MG PO TABS
100.0000 mg | ORAL_TABLET | Freq: Two times a day (BID) | ORAL | Status: DC
  Administered 2012-09-11 – 2012-09-13 (×5): 100 mg via ORAL
  Filled 2012-09-11 (×6): qty 1

## 2012-09-11 MED ORDER — INSULIN GLARGINE 100 UNIT/ML ~~LOC~~ SOLN
20.0000 [IU] | Freq: Every day | SUBCUTANEOUS | Status: DC
  Administered 2012-09-11: 20 [IU] via SUBCUTANEOUS

## 2012-09-11 MED ORDER — LIVING WELL WITH DIABETES BOOK
Freq: Once | Status: AC
  Administered 2012-09-12: 16:00:00
  Filled 2012-09-11 (×2): qty 1

## 2012-09-11 MED ORDER — INSULIN PEN STARTER KIT
1.0000 | Freq: Once | Status: AC
  Administered 2012-09-13: 1
  Filled 2012-09-11 (×2): qty 1

## 2012-09-11 NOTE — Progress Notes (Signed)
Inpatient Diabetes Program Recommendations  AACE/ADA: New Consensus Statement on Inpatient Glycemic Control (2013)  Target Ranges:  Prepandial:   less than 140 mg/dL      Peak postprandial:   less than 180 mg/dL (1-2 hours)      Critically ill patients:  140 - 180 mg/dL   Reason for Visit: Hyperglycemia  Pt eating well.  States she recently lost approx 60 pounds by eliminating sweet tea and soda, and making healthier food choices.  Previously been on metformin, but hasn't taken it for awhile.  States she threw away meter because she didn't use it. Discussed HgbA1C results, insulin administration, diet and importance of maintaining good glycemic control to prevent long-term complications. Pt has not attended any diabetes classes in the past.  Results for Marisa Forbes, Marisa Forbes (MRN [id number]) as of 09/11/2012 13:18  Ref. Range 09/10/2012 07:55 09/10/2012 12:13 09/10/2012 17:12 09/10/2012 21:37 09/11/2012 08:31 09/11/2012 12:33  Glucose-Capillary Latest Range: 70-99 mg/dL 409 (H) 811 (H) 914 (H) 285 (H) 177 (H) 237 (H)  Results for Marisa Forbes, Marisa Forbes (MRN [id number]) as of 09/11/2012 13:18  Ref. Range 09/11/2012 04:20  Sodium Latest Range: 135-145 mEq/L 136  Potassium Latest Range: 3.5-5.1 mEq/L 3.2 (L)  Chloride Latest Range: 96-112 mEq/L 102  CO2 Latest Range: 19-32 mEq/L 22  BUN Latest Range: 6-23 mg/dL 9  Creatinine Latest Range: 0.50-1.10 mg/dL 0.86 (L)  Calcium Latest Range: 8.4-10.5 mg/dL 8.5  GFR calc non Af Amer Latest Range: >90 mL/min >90  GFR calc Af Amer Latest Range: >90 mL/min >90  Glucose Latest Range: 70-99 mg/dL 578 (H)   Living Well with Diabetes book ordered, along with Insulin Starter Kit for RN to begin teaching insulin adminstration.  Husband had questions and seems to be very supportive.  Recommendation:  Increase meal coverage insulin to Novolog 6 units tidwc. Increase basal insulin to Lantus 18 units QHS. Will need f/u with PCP to manage DM. Metformin 500 bid to  begin at discharge. Pt to view diabetes videos on pt ed channel.  Will followup tomorrow am for questions.

## 2012-09-11 NOTE — Progress Notes (Signed)
CARDIAC REHAB PHASE I   PRE:  Rate/Rhythm: 79 SR  BP:  Supine:   Sitting: 134/66  Standing:    SaO2:   MODE:  Ambulation: 620 ft   POST:  Rate/Rhythem: 87 SR  BP:  Supine:   Sitting:   Standing:    SaO2:  1415-1445 Assisted X 1 and used walker to ambulate. Pt c./o of legs feeling like  Rubber and weakness. Pt has trouble getting to standing position and needed help to get standing. Once up able to walk 620 feet, very slow pace and takes small steps. Pt states that she normally walks faster. Pt to side of bed after walk, for transfer to new room. Encouraged pt to stay OOB and in recliner.  Marisa Forbes

## 2012-09-11 NOTE — Consult Note (Addendum)
[facility] [place] TEAM 1 - Stepdown/ICU TEAM Consult F/U Note  Assessment/Plan  Uncontrolled DM: Hbaic 13.6 CBGs not at goal - will adjust lantus insulin and meal coverage - initiate Glucophage at time of d/c - will ask CSW to investigate cost of lantus vs/ 70/30 at home - insulin teaching per nursing - consult to Dietician - lifestyle modification - outpt DM education - ACE inhibitor - ASA - counseled on neuropathy and diligent foot care - immunizations to be given   STEMI/CAD Per primary service  HTN Per primary service  Tremor of head/neck ? benign essential tremor - will assess B12, folic acid, and TSH - given risk factor profile will obtain MRI of head to r/o cerebellar or midbrain CVA - pt has a family hx of Parkinson's (Marisa Forbes) - keep Wilson's disease in the differential, though unlikely ____________________________________________________________________________  Requesting physician: [facility]  Primary Care Physician: Does not have a PMD - has avoided MDs due to insurance issues  Reason for consultation: Uncontrolled DM   History of Present Illness: 61/F with PMH of DM, HTN admitted 11/9 with STEMI, s/p DES to LAD, Marisa Forbes blood sugars have been uncontrolled for a while and she admits being on diabetic pills in past (Glucophage/glucotrol) but poor complaince with meds and was getting CBG and medical care very infrequently.  Marisa Forbes husband reported that she has lost over 60lbs and they have been trying to eat healthier.  During hospitalization found to have A1c of 13.6 and CBGs in 250-350 range.  Allergies:  No Known Allergies    Past Medical History  Diagnosis Date  . HTN (hypertension)   . Diabetes mellitus without complication    Scheduled Meds: Reviewed by MD  Social History: Non-smoker  HPI: Resting comfortably.  No cp, n/v, f/c, or abdom pain. Is very concerned about Marisa Forbes tremor of the head.  Physical Exam: Blood pressure 126/70, pulse 84, temperature  97.8 F (36.6 C), temperature source Oral, resp. rate 18, height 5\' 5"  (1.651 m), weight 64.5 kg (142 lb 3.2 oz), SpO2 97.00%.  General appearance: alert, cooperative and no distress  Lungs: clear to auscultation bilaterally  Heart: RRR w/o gallup or rub  Extremities: no edema c/c Neurologic: Grossly normal, twitching of head noted  Labs:  Reviewed by MD  Radiological Exams: Dg Chest 2 View  09/11/2012  *RADIOLOGY REPORT*  Clinical Data: Recent Myocardial infarct  CHEST - 2 VIEW  Comparison: 10/25/2009  Findings: The heart and pulmonary vascularity are within normal limits.  The lungs are clear bilaterally.  No acute bony abnormality is seen.  IMPRESSION: No acute abnormality noted.   Original Report Authenticated By: Alcide Clever, M.D.    Lonia Blood, MD [facility] Office  [phone] Pager [phone]  On-Call/Text Page:      [email or web address]      password [facility]  09/11/2012, 3:59 PM

## 2012-09-11 NOTE — Progress Notes (Signed)
Subjective:  No further arm pain.  Objective:  Vital Signs in the last 24 hours: Temp:  [98.9 F (37.2 C)-99.6 F (37.6 C)] 99.6 F (37.6 C) (11/13 0322) Pulse Rate:  [82-104] 82  (11/13 0322) Resp:  [16-23] 21  (11/13 0325) BP: (143-166)/(76-89) 144/76 mmHg (11/13 0325) SpO2:  [94 %-96 %] 96 % (11/13 0322) Weight:  [64.5 kg (142 lb 3.2 oz)] 64.5 kg (142 lb 3.2 oz) (11/13 0500)  Intake/Output from previous day:  Intake/Output Summary (Last 24 hours) at 09/11/12 0829 Last data filed at 09/11/12 0800  Gross per 24 hour  Intake   1541 ml  Output   1250 ml  Net    291 ml    Physical Exam: General appearance: alert, cooperative and no distress Lungs: clear to auscultation bilaterally Heart: regular rate and rhythm   Rate: 88  Rhythm: normal sinus rhythm  Lab Results:  Basename 09/11/12 0420 09/10/12 0840  WBC 8.9 10.2  HGB 13.3 14.1  PLT 181 190    Basename 09/11/12 0420 09/10/12 0840  NA 136 138  K 3.2* 3.6  CL 102 103  CO2 22 21  GLUCOSE 207* 307*  BUN 9 9  CREATININE 0.32* 0.41*    Basename 09/10/12 0035 09/09/12 1738  TROPONINI 8.07* 4.42*   Hepatic Function Panel No results found for this basename: PROT,ALBUMIN,AST,ALT,ALKPHOS,BILITOT,BILIDIR,IBILI in the last 72 hours No results found for this basename: CHOL in the last 72 hours  Basename 09/09/12 0901  INR 1.09    Imaging: No results found.  Cardiac Studies:  Assessment/Plan:   Principal Problem:  *Unstable angina, recurrent arm pain 09/09/12 am, relook showed patent stents, distal disease Active Problems:  STEMI 09/07/12  CAD, urgent LAD DES 09/07/12, residual RCA and Dx disease, EF 45-50%  HTN (hypertension)  Diabetes type 2, uncontrolled  Dyslipidemia  Noncompliance with medications, on no medications "for some time"  NSVT, 5 beats 11/10  Plan- Consider stopping Heparin. She needs CXR. Possibly transfer to telemetry later if pain free. Increase ACE, replace K+.    Corine Shelter  PA-C 09/11/2012, 8:29 AM

## 2012-09-11 NOTE — Plan of Care (Signed)
Problem: Food- and Nutrition-Related Knowledge Deficit (NB-1.1) Goal: Nutrition education Formal process to instruct or train a patient/client in a skill or to impart knowledge to help patients/clients voluntarily manage or modify food choices and eating behavior to maintain or improve health.  Outcome: Completed/Met Date Met:  09/11/12  RD consulted for nutrition education regarding diabetes.     Lab Results  Component Value Date    HGBA1C 13.6* 09/07/2012    RD provided "Carbohydrate Counting for People with Diabetes" handout from the Academy of Nutrition and Dietetics. Discussed different food groups and their effects on blood sugar, emphasizing carbohydrate-containing foods. Provided list of carbohydrates and recommended serving sizes of common foods.  Discussed importance of controlled and consistent carbohydrate intake throughout the day. Provided examples of ways to balance meals/snacks and encouraged intake of high-fiber, whole grain complex carbohydrates.  At end of education pt was able to recall number of gram os carbohydrate per serving and number of servings to eat per meal. Pt agreeable to 3 meals per day. Pt was able to identify items to eat for breakfast that would fit into the meal plan. Rd encouraged pt to f/u at out patient DM classes for additional resources and support.   Expect fair to good compliance.  Body mass index is 23.66 kg/(m^2). Pt meets criteria for WNL based on current BMI.  Current diet order is Carb Mod Medium, 2 gm NA, patient is consuming approximately 75% of meals at this time. Labs and medications reviewed. No further nutrition interventions warranted at this time. RD contact information provided. If additional nutrition issues arise, please re-consult RD.  Clarene Duke RD, LDN Pager [phone] After Hours pager [phone]

## 2012-09-11 NOTE — Progress Notes (Signed)
Pt. Seen and examined. Agree with the NP/PA-C note as written. Will need uptitration of her medications, she remains tachycardic with recent NSTEMI, poorly controlled diabetes and hypertension. Increase ACE-I and b-blocker today. Add salt-restriction to diet (there was bacon on her breakfast tray). Stop heparin. Ok to transfer later today to telemetry if bp control is improved.  Probably home in 1-2 days.  Chrystie Nose, MD, [facility] Attending Cardiologist [facility]

## 2012-09-12 ENCOUNTER — Encounter (HOSPITAL_COMMUNITY): Payer: Self-pay | Admitting: Neurology

## 2012-09-12 ENCOUNTER — Inpatient Hospital Stay (HOSPITAL_COMMUNITY): Payer: No Typology Code available for payment source

## 2012-09-12 DIAGNOSIS — I6340 Cerebral infarction due to embolism of unspecified cerebral artery: Secondary | ICD-10-CM

## 2012-09-12 DIAGNOSIS — Z8673 Personal history of transient ischemic attack (TIA), and cerebral infarction without residual deficits: Secondary | ICD-10-CM

## 2012-09-12 LAB — CBC
HCT: 37.4 % (ref 36.0–46.0)
Hemoglobin: 12.7 g/dL (ref 12.0–15.0)
MCH: 28.4 pg (ref 26.0–34.0)
MCHC: 34 g/dL (ref 30.0–36.0)
MCV: 83.7 fL (ref 78.0–100.0)
Platelets: 188 10*3/uL (ref 150–400)
RBC: 4.47 MIL/uL (ref 3.87–5.11)
RDW: 12.9 % (ref 11.5–15.5)
WBC: 6.6 10*3/uL (ref 4.0–10.5)

## 2012-09-12 LAB — BASIC METABOLIC PANEL
BUN: 9 mg/dL (ref 6–23)
CO2: 25 mEq/L (ref 19–32)
Calcium: 9.2 mg/dL (ref 8.4–10.5)
Chloride: 101 mEq/L (ref 96–112)
Creatinine, Ser: 0.38 mg/dL — ABNORMAL LOW (ref 0.50–1.10)
GFR calc Af Amer: >90 mL/min (ref >90)
GFR calc non Af Amer: >90 mL/min (ref >90)
Glucose, Bld: 228 mg/dL — ABNORMAL HIGH (ref 70–99)
Potassium: 3.4 mEq/L — ABNORMAL LOW (ref 3.5–5.1)
Sodium: 139 mEq/L (ref 135–145)

## 2012-09-12 LAB — GLUCOSE, CAPILLARY
Glucose-Capillary: 208 mg/dL — ABNORMAL HIGH (ref 70–99)
Glucose-Capillary: 241 mg/dL — ABNORMAL HIGH (ref 70–99)
Glucose-Capillary: 207 mg/dL — ABNORMAL HIGH (ref 70–99)
Glucose-Capillary: 166 mg/dL — ABNORMAL HIGH (ref 70–99)

## 2012-09-12 MED ORDER — "BD GETTING STARTED TAKE HOME KIT: 1ML X 30 G SYRINGES, "
1.0000 | Freq: Once | Status: AC
  Administered 2012-09-12: 1
  Filled 2012-09-12: qty 1

## 2012-09-12 MED ORDER — INSULIN GLARGINE 100 UNIT/ML ~~LOC~~ SOLN
26.0000 [IU] | Freq: Every day | SUBCUTANEOUS | Status: DC
  Administered 2012-09-12: 26 [IU] via SUBCUTANEOUS

## 2012-09-12 MED ORDER — GADOBENATE DIMEGLUMINE 529 MG/ML IV SOLN
15.0000 mL | Freq: Once | INTRAVENOUS | Status: AC
  Administered 2012-09-12: 15 mL via INTRAVENOUS

## 2012-09-12 MED ORDER — INSULIN ASPART 100 UNIT/ML ~~LOC~~ SOLN
8.0000 [IU] | Freq: Three times a day (TID) | SUBCUTANEOUS | Status: DC
  Administered 2012-09-12 – 2012-09-13 (×2): 8 [IU] via SUBCUTANEOUS

## 2012-09-12 MED ORDER — POTASSIUM CHLORIDE CRYS ER 20 MEQ PO TBCR
40.0000 meq | EXTENDED_RELEASE_TABLET | Freq: Once | ORAL | Status: AC
  Administered 2012-09-12: 40 meq via ORAL
  Filled 2012-09-12: qty 2

## 2012-09-12 NOTE — Progress Notes (Signed)
Clinical Social Work  CSW received inappropriate referral to assist with medication questions. CSW made CM aware of medication needs. CSW is signing off but available if needed.  [place], [place] [phone] (Coverage for Frederico Hamman)

## 2012-09-12 NOTE — Progress Notes (Signed)
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  As for her diabetes, will increase Lantus to 26 U and mealtime insulin to 8 U as sugars are uncontrolled.  B12 is w/in normal range. F/u Folic acid and TSH levels which have been ordered by Dr Dolphus Jenny to assist in evaluation of head tremor.   Calvert Cantor, MD [phone]

## 2012-09-12 NOTE — Progress Notes (Signed)
[facility]  Subjective: Feels better today.  Objective: Vital signs in last 24 hours: Temp:  [97.3 F (36.3 C)-98.2 F (36.8 C)] 97.9 F (36.6 C) (11/14 0403) Pulse Rate:  [67-84] 79  (11/14 1100) Resp:  [16-19] 16  (11/14 0403) BP: (104-148)/(59-87) 148/87 mmHg (11/14 1100) SpO2:  [96 %-100 %] 100 % (11/14 0403) Last BM Date: 09/11/12  Intake/Output from previous day: 11/13 0701 - 11/14 0700 In: 384 [P.O.:360; I.V.:24] Out: -  Intake/Output this shift:    Medications Current Facility-Administered Medications  Medication Dose Route Frequency Provider Last Rate Last Dose  . 0.9 %  sodium chloride infusion  250 mL Intravenous Continuous Marykay Lex, MD 10 mL/hr at 09/10/12 2200 250 mL at 09/10/12 2200  . acetaminophen (TYLENOL) tablet 650 mg  650 mg Oral Q4H PRN Lennette Bihari, MD      . ALPRAZolam Prudy Feeler) tablet 0.25 mg  0.25 mg Oral TID PRN Abelino Derrick, PA   0.25 mg at 09/09/12 0640  . antiseptic oral rinse (BIOTENE) solution 15 mL  15 mL Mouth Rinse BID Lennette Bihari, MD   15 mL at 09/12/12 0837  . aspirin EC tablet 81 mg  81 mg Oral Daily Lennette Bihari, MD   81 mg at 09/12/12 1053  . atorvastatin (LIPITOR) tablet 80 mg  80 mg Oral q1800 [name], PA   80 mg at 09/11/12 1805  . Flexpen Starter Kit 1 kit  1 kit Other Once Provider Default, MD      . [address] gadobenate dimeglumine (MULTIHANCE) injection 15 mL  15 mL Intravenous Once Medication Radiologist, MD   15 mL at 09/12/12 0745  . insulin aspart (novoLOG) injection 0-9 Units  0-9 Units Subcutaneous TID WC [name], PA   3 Units at 09/12/12 0617  . insulin aspart (novoLOG) injection 6 Units  6 Units Subcutaneous TID WC Lonia Blood, MD   6 Units at 09/12/12 [id number]  . insulin glargine (LANTUS) injection 20 Units  20 Units Subcutaneous QHS Lonia Blood, MD   20 Units at 09/11/12 2137  . isosorbide mononitrate (IMDUR) 24 hr tablet 60 mg  60 mg Oral Daily Lennette Bihari,  MD   60 mg at 09/12/12 1053  . lisinopril (PRINIVIL,ZESTRIL) tablet 10 mg  10 mg Oral Daily Eda Paschal [place], [place]   10 mg at 09/12/12 1053  . living well with diabetes book MISC   Does not apply Once Provider Default, MD      . metoprolol (LOPRESSOR) tablet 100 mg  100 mg Oral BID Abelino Derrick, PA   100 mg at 09/12/12 1053  . morphine 2 MG/ML injection 2 mg  2 mg Intravenous Q2H PRN Lennette Bihari, MD   2 mg at 09/09/12 2331  . nitroGLYCERIN (NITROSTAT) SL tablet 0.4 mg  0.4 mg Sublingual Q5 Min x 3 PRN [name], PA      . ondansetron (ZOFRAN) injection 4 mg  4 mg Intravenous Q6H PRN Lennette Bihari, MD      . prasugrel (EFFIENT) tablet 10 mg  10 mg Oral Daily Lennette Bihari, MD   10 mg at 09/12/12 1053  . ranolazine (RANEXA) 12 hr tablet 500 mg  500 mg Oral BID Marykay Lex, MD   500 mg at 09/12/12 1053  . sodium chloride 0.9 % injection 3 mL  3 mL Intravenous Q12H Marykay Lex, MD   3 mL at 09/12/12  1102  . sodium chloride 0.9 % injection 3 mL  3 mL Intravenous PRN Marykay Lex, MD      . traMADol Janean Sark) tablet 50 mg  50 mg Oral Q6H PRN Abelino Derrick, PA   50 mg at 09/11/12 1010  . zolpidem (AMBIEN) tablet 10 mg  10 mg Oral QHS PRN Abelino Derrick, PA      . [DISCONTINUED] insulin aspart (novoLOG) injection 4 Units  4 Units Subcutaneous TID WC Zannie Cove, MD   4 Units at 09/11/12 1248  . [DISCONTINUED] insulin glargine (LANTUS) injection 15 Units  15 Units Subcutaneous QHS Zannie Cove, MD   15 Units at 09/10/12 2215    PE: General appearance: alert Lungs: clear to auscultation bilaterally Heart: regular rate and rhythm, S1, S2 normal, no murmur, click, rub or gallop Extremities: No LEE Pulses: 2+ and symmetric Neurologic: Grossly normal  Lab Results:   Basename 09/12/12 0426 09/11/12 0420 09/10/12 0840  WBC 6.6 8.9 10.2  HGB 12.7 13.3 14.1  HCT 37.4 38.8 41.0  PLT 188 181 190   BMET  Basename 09/12/12 0426 09/11/12 0420 09/10/12 0840  NA 139 136 138  K 3.4* 3.2*  3.6  CL 101 102 103  CO2 25 22 21   GLUCOSE 228* 207* 307*  BUN 9 9 9   CREATININE 0.38* 0.32* 0.41*  CALCIUM 9.2 8.5 9.1    Assessment/Plan   Principal Problem:  *Unstable angina, recurrent arm pain 09/09/12 am, relook showed patent stents, distal disease Active Problems:  STEMI 09/07/12  HTN (hypertension)  Diabetes type 2, uncontrolled  Dyslipidemia  CAD, urgent LAD DES 09/07/12, residual RCA and Dx disease, EF 45-50%  Noncompliance with medications, on no medications "for some time"  NSVT, 5 beats 11/10  Tremor, coarse  Plan:  S/P relook cath Monday. Plan for optimized medical management.  Possible PCI as OP.   Uncontrolled DM... IM consulted.  Thank You!  BP meds adjusted yesterday.  BP improved.  Hr stable.   Replace potassium today.   I had an extensive discussion about diet today with Pt, daughter and husband. BP and HR controlled.  Possible DC home tomorrow.  Out of work for one month.   LOS: 5 days    HAGER, BRYAN 09/12/2012 12:18 PM   Agree with note written by Jones Skene PAC  No CP. S/P acute intervention in the setting od a STEMI by DR. TK on Sat with re look on Monday by Dr. Dwaine Deter because of recurrent CP with unchanged anatomy. Plan for medical treatment of residual CAD. Prob D/C home tomorrow. ROV with TK 2-3 weeks. He will make disposition regarding staged intervention.   Runell Gess 09/12/2012 5:37 PM

## 2012-09-12 NOTE — Progress Notes (Signed)
Inpatient Diabetes Program Recommendations  AACE/ADA: New Consensus Statement on Inpatient Glycemic Control (2013)  Target Ranges:  Prepandial:   less than 140 mg/dL      Peak postprandial:   less than 180 mg/dL (1-2 hours)      Critically ill patients:  140 - 180 mg/dL   Reason for Visit: Hyperglycemia  Pt feeling much better.  Has not given herself insulin yet.  Has not watched diabetes videos on pt ed channel.  Explained importance of  Education with controlling blood sugars at home.    Results for Marisa, Forbes (MRN [id number]) as of 09/12/2012 11:31  Ref. Range 09/12/2012 04:26  Sodium Latest Range: 135-145 mEq/L 139  Potassium Latest Range: 3.5-5.1 mEq/L 3.4 (L)  Chloride Latest Range: 96-112 mEq/L 101  CO2 Latest Range: 19-32 mEq/L 25  BUN Latest Range: 6-23 mg/dL 9  Creatinine Latest Range: 0.50-1.10 mg/dL 4.09 (L)  Calcium Latest Range: 8.4-10.5 mg/dL 9.2  GFR calc non Af Amer Latest Range: >90 mL/min >90  GFR calc Af Amer Latest Range: >90 mL/min >90  Glucose Latest Range: 70-99 mg/dL 811 (H)   Results for Marisa, Forbes (MRN [id number]) as of 09/12/2012 11:31  Ref. Range 09/11/2012 18:02 09/11/2012 21:07 09/12/2012 06:12 09/12/2012 11:19  Glucose-Capillary Latest Range: 70-99 mg/dL 213 (H) 086 (H) 578 (H) 241 (H)   Discussed with RN to begin teaching insulin administration today.  Will reorder starter kit and Living Well with Diabetes book and Outpatient Education consult.  Will followup with pt in am with any further questions.  Again stressed importance of controlling blood sugars at home and f/u with PCP for insulin adjustments.

## 2012-09-12 NOTE — Consult Note (Signed)
Reason for Consult: Stroke Referring Physician: Dr. Lenord Forbes is an 61 y.o. female.  HPI:  Marisa Forbes is a 61 year old right-handed white female with a history of hypertension, diabetes, and dyslipidemia. The patient has been treated for these entities in the past, but more recently, she has been lost to followup, and she has not been on any medications. The patient presented to the hospital with onset of abdominal pain that was eventually found to be related to a myocardial infarction. The patient was admitted to the hospital on 09/07/2012. The patient underwent coronary artery catheterization and stenting. The patient has done quite well following the procedure. The patient reports a head and neck tremor that has been present for greater than one year. The tremor has gradually worsened over time, only involving the head and neck without focal tremor or tremor involving arms. Due to the tremor, a MRI of the brain was ordered, and surprisingly shows evidence of bihemispheric anterior and posterior circulation infarcts consistent with a cardioembolic events. The patient herself has not reported any numbness, weakness, ataxia, headache, vision change, speech change, swallowing change, or changes in memory. The patient has been completely asymptomatic. NIH stroke scale score is 0. The patient is not a TPA candidate secondary to duration of deficit. Neurology is called for an evaluation.    Past Medical History  Diagnosis Date  . HTN (hypertension)   . Diabetes mellitus without complication   . Stroke, embolic     History reviewed. No pertinent past surgical history.  History reviewed. No pertinent family history.  Social History:  reports that she has never smoked. She does not have any smokeless tobacco history on file. Her alcohol and drug histories not on file.  Allergies: No Known Allergies  Medications:  Prior to Admission:  Prescriptions prior to admission  Medication Sig  Dispense Refill  . ibuprofen (ADVIL,MOTRIN) 200 MG tablet Take 200 mg by mouth every 6 (six) hours as needed. For pain       Scheduled:   . antiseptic oral rinse  15 mL Mouth Rinse BID  . aspirin EC  81 mg Oral Daily  . atorvastatin  80 mg Oral q1800  . [COMPLETED] bd getting started take home kit  1 kit Other Once  . Flexpen Starter Kit  1 kit Other Once  . [COMPLETED] gadobenate dimeglumine  15 mL Intravenous Once  . insulin aspart  0-9 Units Subcutaneous TID WC  . insulin aspart  8 Units Subcutaneous TID WC  . insulin glargine  26 Units Subcutaneous QHS  . isosorbide mononitrate  60 mg Oral Daily  . lisinopril  10 mg Oral Daily  . [COMPLETED] living well with diabetes book   Does not apply Once  . metoprolol tartrate  100 mg Oral BID  . [COMPLETED] potassium chloride  40 mEq Oral Once  . prasugrel  10 mg Oral Daily  . ranolazine  500 mg Oral BID  . sodium chloride  3 mL Intravenous Q12H  . [DISCONTINUED] insulin aspart  6 Units Subcutaneous TID WC  . [DISCONTINUED] insulin glargine  20 Units Subcutaneous QHS   Continuous:   . sodium chloride 250 mL (09/10/12 2200)   GMW:NUUVOZDGUYQIH, ALPRAZolam, morphine injection, nitroGLYCERIN, ondansetron (ZOFRAN) IV, sodium chloride, traMADol, zolpidem  Results for orders placed during the hospital encounter of 09/07/12 (from the past 48 hour(s))  GLUCOSE, CAPILLARY     Status: Abnormal   Collection Time   09/10/12  9:37 PM  Component Value Range Comment   Glucose-Capillary 285 (*) 70 - 99 mg/dL   CBC     Status: Normal   Collection Time   09/11/12  4:20 AM      Component Value Range Comment   WBC 8.9  4.0 - 10.5 K/uL    RBC 4.66  3.87 - 5.11 MIL/uL    Hemoglobin 13.3  12.0 - 15.0 g/dL    HCT 16.1  09.6 - 04.5 %    MCV 83.3  78.0 - 100.0 fL    MCH 28.5  26.0 - 34.0 pg    MCHC 34.3  30.0 - 36.0 g/dL    RDW 40.9  81.1 - 91.4 %    Platelets 181  150 - 400 K/uL   BASIC METABOLIC PANEL     Status: Abnormal   Collection Time     09/11/12  4:20 AM      Component Value Range Comment   Sodium 136  135 - 145 mEq/L    Potassium 3.2 (*) 3.5 - 5.1 mEq/L    Chloride 102  96 - 112 mEq/L    CO2 22  19 - 32 mEq/L    Glucose, Bld 207 (*) 70 - 99 mg/dL    BUN 9  6 - 23 mg/dL    Creatinine, Ser 7.82 (*) 0.50 - 1.10 mg/dL    Calcium 8.5  8.4 - 95.6 mg/dL    GFR calc non Af Amer >90  >90 mL/min    GFR calc Af Amer >90  >90 mL/min   HEPARIN LEVEL (UNFRACTIONATED)     Status: Normal   Collection Time   09/11/12  4:20 AM      Component Value Range Comment   Heparin Unfractionated 0.42  0.30 - 0.70 IU/mL   GLUCOSE, CAPILLARY     Status: Abnormal   Collection Time   09/11/12  8:31 AM      Component Value Range Comment   Glucose-Capillary 177 (*) 70 - 99 mg/dL   GLUCOSE, CAPILLARY     Status: Abnormal   Collection Time   09/11/12 12:33 PM      Component Value Range Comment   Glucose-Capillary 237 (*) 70 - 99 mg/dL   VITAMIN O13     Status: Normal   Collection Time   09/11/12  4:19 PM      Component Value Range Comment   Vitamin B-12 819  211 - 911 pg/mL   GLUCOSE, CAPILLARY     Status: Abnormal   Collection Time   09/11/12  6:02 PM      Component Value Range Comment   Glucose-Capillary 271 (*) 70 - 99 mg/dL    Comment 1 Documented in Chart      Comment 2 Notify RN     GLUCOSE, CAPILLARY     Status: Abnormal   Collection Time   09/11/12  9:07 PM      Component Value Range Comment   Glucose-Capillary 201 (*) 70 - 99 mg/dL    Comment 1 Notify RN      Comment 2 Documented in Chart     CBC     Status: Normal   Collection Time   09/12/12  4:26 AM      Component Value Range Comment   WBC 6.6  4.0 - 10.5 K/uL    RBC 4.47  3.87 - 5.11 MIL/uL    Hemoglobin 12.7  12.0 - 15.0 g/dL    HCT 08.6  57.8 - 46.9 %  MCV 83.7  78.0 - 100.0 fL    MCH 28.4  26.0 - 34.0 pg    MCHC 34.0  30.0 - 36.0 g/dL    RDW 16.1  09.6 - 04.5 %    Platelets 188  150 - 400 K/uL   BASIC METABOLIC PANEL     Status: Abnormal   Collection  Time   09/12/12  4:26 AM      Component Value Range Comment   Sodium 139  135 - 145 mEq/L    Potassium 3.4 (*) 3.5 - 5.1 mEq/L    Chloride 101  96 - 112 mEq/L    CO2 25  19 - 32 mEq/L    Glucose, Bld 228 (*) 70 - 99 mg/dL    BUN 9  6 - 23 mg/dL    Creatinine, Ser 4.09 (*) 0.50 - 1.10 mg/dL    Calcium 9.2  8.4 - 81.1 mg/dL    GFR calc non Af Amer >90  >90 mL/min    GFR calc Af Amer >90  >90 mL/min   GLUCOSE, CAPILLARY     Status: Abnormal   Collection Time   09/12/12  6:12 AM      Component Value Range Comment   Glucose-Capillary 208 (*) 70 - 99 mg/dL   GLUCOSE, CAPILLARY     Status: Abnormal   Collection Time   09/12/12 11:19 AM      Component Value Range Comment   Glucose-Capillary 241 (*) 70 - 99 mg/dL    Comment 1 Notify RN      Comment 2 Documented in Chart     GLUCOSE, CAPILLARY     Status: Abnormal   Collection Time   09/12/12  4:10 PM      Component Value Range Comment   Glucose-Capillary 207 (*) 70 - 99 mg/dL    Comment 1 Documented in Chart      Comment 2 Notify RN       Dg Chest 2 View  09/11/2012  *RADIOLOGY REPORT*  Clinical Data: Recent Myocardial infarct  CHEST - 2 VIEW  Comparison: 10/25/2009  Findings: The heart and pulmonary vascularity are within normal limits.  The lungs are clear bilaterally.  No acute bony abnormality is seen.  IMPRESSION: No acute abnormality noted.   Original Report Authenticated By: Alcide Clever, M.D.    Mr Laqueta Jean BJ Contrast  09/12/2012  *RADIOLOGY REPORT*  Clinical Data: Tremors.  Rule out CVA.  MRI HEAD WITHOUT AND WITH CONTRAST  Technique:  Multiplanar, multiecho pulse sequences of the brain and surrounding structures were obtained according to standard protocol without and with intravenous contrast  Contrast: 15mL MULTIHANCE GADOBENATE DIMEGLUMINE 529 MG/ML IV SOLN  Comparison: none  Findings: Multiple areas of acute infarct are present in various vascular distributions compatible with cerebral emboli.  Small acute infarct in the  left   inferior cerebellum.  Small acute infarcts  in the right occipital lobe, left parietal white matter, left frontal pericallosal white matter, left frontal subcortical white matter, and right parietal cortex over the convexity.  In addition, there is a chronic infarct in the right inferior cerebellar white matter.  There is a chronic infarct in the right thalamus and right occipital lobe and chronic ischemic changes in the cerebral white matter bilaterally.  There is a small area of chronic hemorrhage in the right inferior cerebellum as well as in the right temporal parietal periventricular white matter and left medial temporal lobe.  Negative for mass or edema.  Ventricle size is normal.  No midline shift.  Postcontrast imaging reveals normal enhancement.  No intracranial mass lesion is identified.  Mild mucosal edema in the paranasal sinuses without air-fluid level.  IMPRESSION: Multiple small areas of acute infarct in the various vascular distributions.  The pattern suggests cerebral emboli.  There are multiple areas of chronic ischemia present in the white matter bilaterally in the right cerebellum.  Several areas of chronic micro hemorrhage are present.   Original Report Authenticated By: Janeece Riggers, M.D.     @ROS @  Patient denies any fevers, chills, headache.  The patient denies shortness of breath, chest pains.  The patient presents with abdominal discomfort, no nausea or vomiting.  The patient denies any problems controlling the bowels or the bladder  The patient denies any numbness, weakness, dizziness, loss of consciousness, speech disturbance.  The patient denies any problems with walking.  Blood pressure 137/82, pulse 85, temperature 97.6 F (36.4 C), temperature source Oral, resp. rate 18, height 5\' 5"  (1.651 m), weight 64.5 kg (142 lb 3.2 oz), SpO2 97.00%.  Physical Examination:  General:  The patient is alert and cooperative at the time of the examination. The patient is  oriented x3.  Respiratory:  Lungs fields are clear to auscultation bilaterally.  Cardiovascular:  Examination reveals a regular rate and rhythm, no obvious murmurs or rubs are noted.  Abdominal Exam:  Abdomen is soft and nontender, positive bowel sounds are noted. No organomegaly is noted.  Extremities:  Extremities are without significant edema.    Neurologic Examination  Cranial Nerves:  Facial symmetry is present. Pupils are equal, round, and reactive to light. Visual fields are full. Speech is normal, no aphasia or dysarthria is noted. Pin prick sensation on the face is symmetric and normal.  Motor Examination: Motor testing of all 4 extremities reveals normal strength of the arms and the legs.  Sensory Examination: Sensory examination of the arms and legs shows normal pinprick, soft touch, and vibration sensation throughout.  Cerebellar Examination: The patient has good finger-nose-finger and heel-to-shin bilaterally. Gait was not tested.  Deep Tendon Reflexes: Deep tendon reflexes are symmetric and normal throughout. Toes are downgoing bilaterally.  Interval: Baseline (11/12 0800) Level of Consciousness (1a. ): Alert, keenly responsive (11/12 0800) LOC Questions (1b. ): Answers both questions correctly (11/12 0800) LOC Commands (1c. ): Performs both tasks correctly (11/12 0800) Best Gaze (2. ): Normal (11/12 0800) Visual (3. ): No visual loss (11/12 0800) Facial Palsy (4. ): Normal symmetrical movements (11/12 0800) Motor Arm, Left (5a. ): No drift (11/12 0800) Motor Arm, Right (5b. ): No drift (11/12 0800) Motor Leg, Left (6a. ): No drift (11/12 0800) Motor Leg, Right (6b. ): No drift (11/12 0800) Limb Ataxia (7. ): Absent (11/12 0800) Sensory (8. ): Normal, no sensory loss (11/12 0800) Best Language (9. ): No aphasia (11/12 0800) Dysarthria (10. ): Normal (11/12 0800) Inattention/Extinction: No Abnormality (11/12 0800) Total: 0  (11/12  0800)     Assessment/Plan:  1. Bihemispheric stroke events, cardioembolic  2. Myocardial infarction  3. Untreated hypertension  4. Untreated diabetes  The patient was not on aspirin or any medications for diabetes, hypertension, or dyslipidemia prior to admission. The patient likely has suffered a cardioembolic stroke event secondary to the myocardial infarction. The patient has already undergone a coronary artery catheterization, and I am not sure that a TEE as much to offer over and above this. The patient will be set up for MRA evaluation of the head  and neck. The patient will remain on antiplatelet agents at this time. The patient does not require any rehabilitation. Stroke team will follow.   Marisa Forbes 09/12/2012, 8:39 PM

## 2012-09-12 NOTE — Progress Notes (Signed)
Pt demonstrated insulin administration with minimal cueing, needs followup and practice Marisa Forbes

## 2012-09-12 NOTE — Progress Notes (Signed)
CARDIAC REHAB PHASE I   PRE:  Rate/Rhythm: 72 SR  BP:  Supine:   Sitting: 110/64  Standing:    SaO2: 96 RA  MODE:  Ambulation: 550 ft   POST:  Rate/Rhythem: 79  BP:  Supine:   Sitting: 120/70  Standing:   SaO2: 98 RA 1405-1510 Assisted X 1 and used walker to ambulate. Gait steady with walker.Pt able to get to standing position alone today. She states that she feels much stronger today.Started MI  education with pt, husband and another family member.Pt voices understanding. She agrees to [employer]. CRP in [place], will send referral.Pt's pace fast than yesterday, still takes small steps.Will follow pt in am to complete education.  Marisa Forbes

## 2012-09-13 ENCOUNTER — Inpatient Hospital Stay (HOSPITAL_COMMUNITY): Payer: No Typology Code available for payment source

## 2012-09-13 LAB — BASIC METABOLIC PANEL
BUN: 9 mg/dL (ref 6–23)
CO2: 26 mEq/L (ref 19–32)
Calcium: 9.1 mg/dL (ref 8.4–10.5)
Chloride: 101 mEq/L (ref 96–112)
Creatinine, Ser: 0.44 mg/dL — ABNORMAL LOW (ref 0.50–1.10)
GFR calc Af Amer: >90 mL/min (ref >90)
GFR calc non Af Amer: >90 mL/min (ref >90)
Glucose, Bld: 274 mg/dL — ABNORMAL HIGH (ref 70–99)
Potassium: 3.7 mEq/L (ref 3.5–5.1)
Sodium: 135 mEq/L (ref 135–145)

## 2012-09-13 LAB — CBC
HCT: 36.7 % (ref 36.0–46.0)
Hemoglobin: 12.4 g/dL (ref 12.0–15.0)
MCH: 28.4 pg (ref 26.0–34.0)
MCHC: 33.8 g/dL (ref 30.0–36.0)
MCV: 84.2 fL (ref 78.0–100.0)
Platelets: 220 10*3/uL (ref 150–400)
RBC: 4.36 MIL/uL (ref 3.87–5.11)
RDW: 13 % (ref 11.5–15.5)
WBC: 6.6 10*3/uL (ref 4.0–10.5)

## 2012-09-13 LAB — GLUCOSE, CAPILLARY
Glucose-Capillary: 243 mg/dL — ABNORMAL HIGH (ref 70–99)
Glucose-Capillary: 228 mg/dL — ABNORMAL HIGH (ref 70–99)
Glucose-Capillary: 217 mg/dL — ABNORMAL HIGH (ref 70–99)

## 2012-09-13 MED ORDER — METFORMIN HCL ER 750 MG PO TB24
750.0000 mg | ORAL_TABLET | Freq: Every day | ORAL | Status: DC
  Filled 2012-09-13 (×2): qty 1

## 2012-09-13 MED ORDER — ASPIRIN 81 MG PO TBEC: 81.0000 mg | DELAYED_RELEASE_TABLET | Freq: Every day | ORAL | Status: DC

## 2012-09-13 MED ORDER — PRASUGREL HCL 10 MG PO TABS: 10.0000 mg | ORAL_TABLET | Freq: Every day | ORAL | Status: DC

## 2012-09-13 MED ORDER — METOPROLOL TARTRATE 100 MG PO TABS: 100.0000 mg | ORAL_TABLET | Freq: Two times a day (BID) | ORAL | Status: DC

## 2012-09-13 MED ORDER — METFORMIN HCL ER 750 MG PO TB24: 750.0000 mg | ORAL_TABLET | Freq: Every day | ORAL | Status: DC

## 2012-09-13 MED ORDER — IOHEXOL 350 MG/ML SOLN
80.0000 mL | Freq: Once | INTRAVENOUS | Status: AC | PRN
  Administered 2012-09-13: 80 mL via INTRAVENOUS

## 2012-09-13 MED ORDER — RANOLAZINE ER 500 MG PO TB12: 500.0000 mg | ORAL_TABLET | Freq: Two times a day (BID) | ORAL | Status: DC

## 2012-09-13 MED ORDER — ATORVASTATIN CALCIUM 80 MG PO TABS: 80.0000 mg | ORAL_TABLET | Freq: Every day | ORAL | Status: DC

## 2012-09-13 MED ORDER — INSULIN ASPART 100 UNIT/ML ~~LOC~~ SOLN: 10.0000 [IU] | Freq: Three times a day (TID) | SUBCUTANEOUS | Status: DC

## 2012-09-13 MED ORDER — INSULIN GLARGINE 100 UNIT/ML ~~LOC~~ SOLN: 31.0000 [IU] | Freq: Every day | SUBCUTANEOUS | Status: DC

## 2012-09-13 MED ORDER — LISINOPRIL 10 MG PO TABS: 10.0000 mg | ORAL_TABLET | Freq: Every day | ORAL | Status: DC

## 2012-09-13 MED ORDER — NITROGLYCERIN 0.4 MG SL SUBL: 0.4000 mg | SUBLINGUAL_TABLET | SUBLINGUAL | Status: DC | PRN

## 2012-09-13 MED ORDER — ISOSORBIDE MONONITRATE ER 60 MG PO TB24: 60.0000 mg | ORAL_TABLET | Freq: Every day | ORAL | Status: DC

## 2012-09-13 MED ORDER — INSULIN ASPART 100 UNIT/ML ~~LOC~~ SOLN
10.0000 [IU] | Freq: Three times a day (TID) | SUBCUTANEOUS | Status: DC
  Administered 2012-09-13 (×2): 10 [IU] via SUBCUTANEOUS

## 2012-09-13 NOTE — Progress Notes (Signed)
I seen and evaluated the patient this morning along with [name], PA. I agree with his findings, examination as well as impression recommendations.  Relatively stable from a cardiac standpoint -- no further Angina on DAPT, Nitrate, ACE-I & BB along with Ranexa (for small vessel CAD) & statin.  I am in favor of medical therapy of existing residual CAD with plan to consider OP Myoview to assess for additional areas of ischemia prior to considering PTCA/PCI of her small vessel CAD.  ROV with Dr. Wonda Cheng in ~2-3 weeks.  DM - Appreciate TRH input / assistance with glycemic control regimen.  DM counseling is vital. Still with CBGs from 200s to 160.  Given her A1c - I suspect that she would end up being on insul  Neuro - likely had shower of emboli, ? If associated with anterior STEMI & possible apical thrombus.  No residual thrombus noted on Echo.  She is on DAPT.  MRA head & neck pending.  Neurology consultation recommendations pending.    BP & HR stable - no arrythmia & no CHF.  From a Cardiology standpoint, she is essentially ready for d/c -- will wait for word from [facility] consultants for additional recommendations prior to discharge.  Marykay Lex, M.D., M.S. [facility] [address] [place], [place]  [address]  [phone] Pager # [phone] 09/13/2012 9:58 AM

## 2012-09-13 NOTE — Progress Notes (Signed)
[facility]  Notes reviewed- Discussed case personally c Mr Marisa Forbes for [facility]    A1c was 13.6 this admit  Recommendations for insulin:-  She needed 62 total units of insulin yestersday Recommend uptitrating to Lantus 31 units with 10 units tid ac-[this would be 50% long acting, 50% short acting Would recommend also reimplementation of metformin 750 XR if no other contraindication   These changes have been made in [facility]   Call me c questions, thanks for consult   Pleas Koch, MD [facility] [phone]

## 2012-09-13 NOTE — Progress Notes (Signed)
[id number] Cardiac Rehab Completed discharge education with pt. She voices understanding pt denies any questions related to education provided.

## 2012-09-13 NOTE — Progress Notes (Signed)
RN bedside swallow screen competed. Pt had no difficulty with swallowing water from cup or using straw. Pt swallowed cracker without difficulty. Breath sounds remained clear. Pt educated on reason for swallow screen and instructed to report any difficulty or changes in ability to swallow food.

## 2012-09-13 NOTE — Progress Notes (Signed)
Marisa Forbes [facility] made aware of CT scan resulst and stated ok to DC, pt reviewed discharge instruction and prescriptions reviewed with patient. Pt has had some diabetes education prior to discharge pt will discharge home with family. [name], Randall An rN

## 2012-09-13 NOTE — Progress Notes (Signed)
Chaplain made a follow-up visit at the request of patient. Patient was awake, alert and responsive during Chaplain's visit. Two family members were also present visiting with patient. Patient expressed strong faith and hope in God. Patient was looking forward to be discharged later in the day. Chaplain gave a prayer shawl as present from the spiritual care department to patient. Chaplain shared words of hope and encouragement with patient and family. Chaplain also provided ministry of presence and empathic listening to both patient and family members. Both patient and family members expressed their appreciation for the gift, Chaplain's visit and spiritual support.

## 2012-09-13 NOTE — Progress Notes (Signed)
Chaplain visited patient during his routine rounds. Patient was awake, alert and responsive. Patient expressed her excitement about progress made in her recovery and looked forward to be discharged soon. Patient expressed strong hope and faith in God. Chaplain provided ministry of presence and compassionate empathic listening. Chaplain also shared words of encouragement and hope with patient and will continue to provide spiritual care to patient as needed. Patient expressed her appreciation for Chaplain's support and visit.

## 2012-09-13 NOTE — Progress Notes (Signed)
Inpatient Diabetes Program Recommendations  AACE/ADA: New Consensus Statement on Inpatient Glycemic Control (2013)  Target Ranges:  Prepandial:   less than 140 mg/dL      Peak postprandial:   less than 180 mg/dL (1-2 hours)      Critically ill patients:  140 - 180 mg/dL   Reason for Visit: Hyperglycemia  LATE ENTRY  Pt gave herself her own insulin without any problems.  States she feels confident in giving insulin at home.  Discussed hypoglycemia risk factors and treatment and she verbalized understanding. Will purchase meter and supplies after discharge and willing to make dietary changes to improve glycemic control.  Continues to have hyperglycemia - metformin added and Lantus and Novolog has been increased.  OP Diabetes Education consult has been ordered and pt again reminded to view diabetes videos on pt ed channel.  CBGs 243 and 228 today.  Lab glucose 274 mg/dL.   Lantus increased to 31 units QHS. Added metformin 750 mg QD. Meal coverage insulin increased to 10 units tidwc. Stressed importance of f/u with PCP.

## 2012-09-13 NOTE — Progress Notes (Signed)
Stroke Team Progress Note  HISTORY   Marisa Forbes is a 61 year old right-handed white female with a history of hypertension, diabetes, and dyslipidemia. The patient has been treated for these entities in the past, but more recently, she has been lost to followup, and she has not been on any medications. The patient presented to the hospital with onset of abdominal pain that was eventually found to be related to a myocardial infarction. The patient was admitted to the hospital on 09/07/2012. The patient underwent coronary artery catheterization and stenting. The patient has done quite well following the procedure. The patient reports a head and neck tremor that has been present for greater than one year. The tremor has gradually worsened over time, only involving the head and neck without focal tremor or tremor involving arms. Due to the tremor, a MRI of the brain was ordered, and surprisingly shows evidence of bihemispheric anterior and posterior circulation infarcts consistent with a cardioembolic events. The patient herself has not reported any numbness, weakness, ataxia, headache, vision change, speech change, swallowing change, or changes in memory. The patient has been completely asymptomatic. NIH stroke scale score is 0. The patient is not a TPA candidate secondary to duration of deficit. Neurology is called for an evaluation.   SUBJECTIVE Her family is at the bedside.  Overall she feels her condition is unchanged. She has tremor with intentional movement.  OBJECTIVE Most recent Vital Signs: Filed Vitals:   09/12/12 1351 09/12/12 1954 09/13/12 0452 09/13/12 0822  BP: 116/63 137/82 136/79 128/76  Pulse: 71 85 73 78  Temp: 98.1 F (36.7 C) 97.6 F (36.4 C) 97.6 F (36.4 C) 97.7 F (36.5 C)  TempSrc: Oral Oral Oral Oral  Resp: 18 18 17 18   Height:      Weight:   63.8 kg (140 lb 10.5 oz)   SpO2: 100% 97% 98% 97%   CBG (last 3)   Basename 09/13/12 0642 09/12/12 2137 09/12/12 1610  GLUCAP  243* 166* 207*    IV Fluid Intake:     . sodium chloride 250 mL (09/10/12 2200)    MEDICATIONS    . antiseptic oral rinse  15 mL Mouth Rinse BID  . aspirin EC  81 mg Oral Daily  . atorvastatin  80 mg Oral q1800  . [COMPLETED] bd getting started take home kit  1 kit Other Once  . Flexpen Starter Kit  1 kit Other Once  . insulin aspart  0-9 Units Subcutaneous TID WC  . insulin aspart  8 Units Subcutaneous TID WC  . insulin glargine  26 Units Subcutaneous QHS  . isosorbide mononitrate  60 mg Oral Daily  . lisinopril  10 mg Oral Daily  . [COMPLETED] living well with diabetes book   Does not apply Once  . metoprolol tartrate  100 mg Oral BID  . [COMPLETED] potassium chloride  40 mEq Oral Once  . prasugrel  10 mg Oral Daily  . ranolazine  500 mg Oral BID  . sodium chloride  3 mL Intravenous Q12H  . [DISCONTINUED] insulin aspart  6 Units Subcutaneous TID WC  . [DISCONTINUED] insulin glargine  20 Units Subcutaneous QHS   PRN:  acetaminophen, ALPRAZolam, morphine injection, nitroGLYCERIN, ondansetron (ZOFRAN) IV, sodium chloride, traMADol, zolpidem  Diet:  Carb Control thin liquids Activity:  Progressive to ambulate in hall if groin stable from cath DVT Prophylaxis:  SCD  CLINICALLY SIGNIFICANT STUDIES Basic Metabolic Panel:  Lab 09/13/12 1914 09/12/12 0426 09/07/12 1840  NA 135 139 --  K 3.7 3.4* --  CL 101 101 --  CO2 26 25 --  GLUCOSE 274* 228* --  BUN 9 9 --  CREATININE 0.44* 0.38* --  CALCIUM 9.1 9.2 --  MG -- -- 1.9  PHOS -- -- --   Liver Function Tests: No results found for this basename: AST:2,ALT:2,ALKPHOS:2,BILITOT:2,PROT:2,ALBUMIN:2 in the last 168 hours CBC:  Lab 09/13/12 0505 09/12/12 0426  WBC 6.6 6.6  NEUTROABS -- --  HGB 12.4 12.7  HCT 36.7 37.4  MCV 84.2 83.7  PLT 220 188   Coagulation:  Lab 09/09/12 0901  LABPROT 14.0  INR 1.09   Cardiac Enzymes:  Lab 09/10/12 0035 09/09/12 1738 09/09/12 1217 09/08/12 0950  CKTOTAL -- -- -- 717*  CKMB -- --  -- 122.4*  CKMBINDEX -- -- -- --  TROPONINI 8.07* 4.42* 8.49* --    Lipid Panel    Component Value Date/Time   CHOL 216* 09/08/2012 0418   TRIG 68 09/08/2012 0418   HDL 60 09/08/2012 0418   CHOLHDL 3.6 09/08/2012 0418   VLDL 14 09/08/2012 0418   LDLCALC 142* 09/08/2012 0418   HgbA1C  Lab Results  Component Value Date   HGBA1C 13.6* 09/07/2012   Mr Brain W Wo Contrast 09/12/2012   Multiple small areas of acute infarct in the various vascular distributions.  The pattern suggests cerebral emboli.  There are multiple areas of chronic ischemia present in the white matter bilaterally in the right cerebellum.  Several areas of chronic micro hemorrhage are present.   2D Echocardiogram EF 40-45%, grade 1 diastolic dysfxns,  Moderate hypokinesis of the mid anteroseptal myocardium; mild hypokinesis of the apical anterior and apical myocardium; possible mild hypokinesis of the mid anterior and apical lateral myocardium.  CXR  No acute abnormality noted.  EKG  normal sinus rhythm.   Therapy Recommendations Working with cardiac rehab.  Physical Exam  Pleasant middle aged  Caucasian lady not in distress.Awake alert. Afebrile. Head is nontraumatic. Neck is supple without bruit. Hearing is normal. Cardiac exam no murmur or gallop. Lungs are clear to auscultation. Distal pulses are well felt.   Neurological Exam : Awake  Alert oriented x 3. Normal speech and language.eye movements full without nystagmus. Face symmetric. Tongue midline.mild resting head tremor. No hand lip or voice tremor noted. No cogwheel rigidity, bradykinesia. Gait is steady without festination or retropulsion. No diminished arm swing noted. Normal strength, tone, reflexes and coordination. Normal sensation. Gait deferred.    ASSESSMENT Marisa Forbes is a 61 y.o. female presenting with history of head and neck tremor for over 1 year. Imaging confirms multiple small areas of acute infarct in the various vascular  distributions. Infarct felt to be embolic secondary to cardiac catheterization but are clinically silent.. On no anticoagulants prior to admission. Now on aspirin 81 mg orally every day for secondary stroke prevention. Patient with longstanding head tremors, essential.   Hypertension  Diabetes mellitus, type 2   Coronary heart disease  Long term medication use  Hospital day # 6  TREATMENT/PLAN  Continue aspirin 81 mg orally every day for secondary stroke prevention.  Risk factor modification Stroke Service will sign off. Follow up with Dr. Pearlean Brownie in 2 months.   Guy Franco, [facility],  MBA, MHA [facility] Pager: [phone] 09/13/2012 10:56 AM  Scribe for Dr. Delia Heady, Stroke Center Medical Director. He has personally reviewed chart, pertinent data, examined the patient and developed the plan of care. Pager:  [phone]

## 2012-09-13 NOTE — Progress Notes (Signed)
[facility]  Subjective: No complaints.  Objective: Vital signs in last 24 hours: Temp:  [97.6 F (36.4 C)-98.1 F (36.7 C)] 97.6 F (36.4 C) (11/15 0452) Pulse Rate:  [71-85] 73  (11/15 0452) Resp:  [17-18] 17  (11/15 0452) BP: (116-148)/(63-87) 136/79 mmHg (11/15 0452) SpO2:  [97 %-100 %] 98 % (11/15 0452) Weight:  [63.8 kg (140 lb 10.5 oz)] 63.8 kg (140 lb 10.5 oz) (11/15 0452) Last BM Date: 09/13/12  Intake/Output from previous day:   Intake/Output this shift:    Medications Current Facility-Administered Medications  Medication Dose Route Frequency Provider Last Rate Last Dose  . 0.9 %  sodium chloride infusion  250 mL Intravenous Continuous Marykay Lex, MD 10 mL/hr at 09/10/12 2200 250 mL at 09/10/12 2200  . acetaminophen (TYLENOL) tablet 650 mg  650 mg Oral Q4H PRN Lennette Bihari, MD      . ALPRAZolam Prudy Feeler) tablet 0.25 mg  0.25 mg Oral TID PRN Abelino Derrick, PA   0.25 mg at 09/09/12 0640  . antiseptic oral rinse (BIOTENE) solution 15 mL  15 mL Mouth Rinse BID Lennette Bihari, MD   15 mL at 09/12/12 2233  . aspirin EC tablet 81 mg  81 mg Oral Daily Lennette Bihari, MD   81 mg at 09/12/12 1053  . atorvastatin (LIPITOR) tablet 80 mg  80 mg Oral q1800 [name], PA   80 mg at 09/12/12 1713  . [COMPLETED] bd getting started take home kit 1 ml X 30g syringes 1 kit  1 kit Other Once Lennette Bihari, MD   1 kit at 09/12/12 1629  . Flexpen Starter Kit 1 kit  1 kit Other Once Provider Default, MD      . insulin aspart (novoLOG) injection 0-9 Units  0-9 Units Subcutaneous TID WC [name], PA   3 Units at 09/13/12 [phone]  . insulin aspart (novoLOG) injection 8 Units  8 Units Subcutaneous TID WC Calvert Cantor, MD   8 Units at 09/13/12 0647  . insulin glargine (LANTUS) injection 26 Units  26 Units Subcutaneous QHS Calvert Cantor, MD   26 Units at 09/12/12 2233  . isosorbide mononitrate (IMDUR) 24 hr tablet 60 mg  60 mg Oral Daily Lennette Bihari, MD   60  mg at 09/12/12 1053  . lisinopril (PRINIVIL,ZESTRIL) tablet 10 mg  10 mg Oral Daily Eda Paschal [place], [place]   10 mg at 09/12/12 1053  . [COMPLETED] living well with diabetes book MISC   Does not apply Once Provider Default, MD      . metoprolol (LOPRESSOR) tablet 100 mg  100 mg Oral BID Eda Paschal [place], PA   100 mg at 09/12/12 2232  . morphine 2 MG/ML injection 2 mg  2 mg Intravenous Q2H PRN Lennette Bihari, MD   2 mg at 09/09/12 2331  . nitroGLYCERIN (NITROSTAT) SL tablet 0.4 mg  0.4 mg Sublingual Q5 Min x 3 PRN [name], PA      . ondansetron (ZOFRAN) injection 4 mg  4 mg Intravenous Q6H PRN Lennette Bihari, MD      . [address] potassium chloride SA (K-DUR,KLOR-CON) CR tablet 40 mEq  40 mEq Oral Once [name], PA   40 mEq at 09/12/12 1713  . prasugrel (EFFIENT) tablet 10 mg  10 mg Oral Daily Lennette Bihari, MD   10 mg at 09/12/12 1053  . ranolazine (RANEXA) 12 hr tablet 500 mg  500  mg Oral BID Marykay Lex, MD   500 mg at 09/12/12 2232  . sodium chloride 0.9 % injection 3 mL  3 mL Intravenous Q12H Marykay Lex, MD   3 mL at 09/12/12 2233  . sodium chloride 0.9 % injection 3 mL  3 mL Intravenous PRN Marykay Lex, MD      . traMADol Janean Sark) tablet 50 mg  50 mg Oral Q6H PRN Abelino Derrick, PA   50 mg at 09/11/12 1010  . zolpidem (AMBIEN) tablet 10 mg  10 mg Oral QHS PRN Abelino Derrick, PA      . [DISCONTINUED] insulin aspart (novoLOG) injection 6 Units  6 Units Subcutaneous TID WC Lonia Blood, MD   6 Units at 09/12/12 1254  . [DISCONTINUED] insulin glargine (LANTUS) injection 20 Units  20 Units Subcutaneous QHS Lonia Blood, MD   20 Units at 09/11/12 2137    PE: General appearance: alert, cooperative and no distress Lungs: clear to auscultation bilaterally Heart: regular rate and rhythm, S1, S2 normal, no murmur, click, rub or gallop Extremities: No LEE Pulses: 2+ and symmetric Neurologic: Grossly normal.  Strength symmetric-4/5  Lab Results:   Basename 09/13/12 0505  09/12/12 0426 09/11/12 0420  WBC 6.6 6.6 8.9  HGB 12.4 12.7 13.3  HCT 36.7 37.4 38.8  PLT 220 188 181   BMET  Basename 09/13/12 0505 09/12/12 0426 09/11/12 0420  NA 135 139 136  K 3.7 3.4* 3.2*  CL 101 101 102  CO2 26 25 22   GLUCOSE 274* 228* 207*  BUN 9 9 9   CREATININE 0.44* 0.38* 0.32*  CALCIUM 9.1 9.2 8.5   MRI HEAD WITHOUT AND WITH CONTRAST  Technique: Multiplanar, multiecho pulse sequences of the brain and  surrounding structures were obtained according to standard protocol  without and with intravenous contrast  Contrast: 15mL MULTIHANCE GADOBENATE DIMEGLUMINE 529 MG/ML IV SOLN  Comparison: none  Findings: Multiple areas of acute infarct are present in various  vascular distributions compatible with cerebral emboli. Small  acute infarct in the left inferior cerebellum. Small acute  infarcts in the right occipital lobe, left parietal white matter,  left frontal pericallosal white matter, left frontal subcortical  white matter, and right parietal cortex over the convexity. In  addition, there is a chronic infarct in the right inferior  cerebellar white matter. There is a chronic infarct in the right  thalamus and right occipital lobe and chronic ischemic changes in  the cerebral white matter bilaterally. There is a small area of  chronic hemorrhage in the right inferior cerebellum as well as in  the right temporal parietal periventricular white matter and left  medial temporal lobe.  Negative for mass or edema. Ventricle size is normal. No midline  shift.  Postcontrast imaging reveals normal enhancement. No intracranial  mass lesion is identified.  Mild mucosal edema in the paranasal sinuses without air-fluid  level.  IMPRESSION:  Multiple small areas of acute infarct in the various vascular  distributions. The pattern suggests cerebral emboli.  There are multiple areas of chronic ischemia present in the white  matter bilaterally in the right cerebellum. Several  areas of  chronic micro hemorrhage are present.  Assessment/Plan  Principal Problem:  *Unstable angina, recurrent arm pain 09/09/12 am, relook showed patent stents, distal disease Active Problems:  STEMI 09/07/12  HTN (hypertension)  Diabetes type 2, uncontrolled  Dyslipidemia  CAD, urgent LAD DES 09/07/12, residual RCA and Dx disease, EF 45-50%  Noncompliance with  medications, on no medications "for some time"  NSVT, 5 beats 11/10  Tremor, coarse  Stroke, embolic  Plan:   1.  S/P STEMI last Saturday, PTCA/Stent LAD at occluded site with 2.25 x 15 Xience DES stent to 2.30 mm.  PTCA to diffusey diseased mid LAD with long infations up to 2.14 maximally, not stented with TIMI 3 flow returned.  RCA: 50% mid, 80% distal before PDA takeoff.  EF 45-50%. Relook cath this past Monday.   Plan for optimized medical management.  ASA, Effient, Lipitor, Imdur, lopressor, lisinopril, ranexa.  FU with Dr. Tresa Endo.  BOP and HR stable and controlled.  2. Multiple small areas of acute infarct in the various vascular distributions.  MRA head and neck pending.  3. Diabetes therapy per [facility].  Thanks!  4. Disposition:  Will await neurologies opinion.  Thank You!     LOS: 6 days    Marisa Forbes 09/13/2012 8:10 AM

## 2012-09-14 LAB — FOLATE: Folate: 18.8 ng/mL (ref >5.4)

## 2012-09-23 NOTE — Discharge Summary (Signed)
Physician Discharge Summary  Patient ID: Marisa Forbes MRN: [id number] DOB/AGE: May 30, 1951 61 y.o.  Admit date: 09/07/2012 Discharge date: 09/23/2012  Admission Diagnoses:  Stemi  Discharge Diagnoses:  Principal Problem:  *Unstable angina, recurrent arm pain 09/09/12 am, relook showed patent stents, distal disease Active Problems:  STEMI 09/07/12  HTN (hypertension)  Diabetes type 2, uncontrolled  Dyslipidemia  CAD, urgent LAD DES 09/07/12, residual RCA and Dx disease, EF 45-50%  Noncompliance with medications, on no medications "for some time"  NSVT, 5 beats 11/10  Tremor, coarse  Stroke, embolic   Discharged Condition: stable  Hospital Course:   The patient is a 61 yo female with a history of HTN, DM. She presented to [facility] with CP that began at 0900hrs today. She reported nausea but no vomiting. EKG showed 5mm of ST elevation in anterior leads. The patient requested to be transported to [facility].  She was taken emergently to the Cath Lab.  Left heart cath revealed 100% occlusion in the LAD after Dx1 and Septal1 with Timi 0 flow; 80% ostial and Mid Dx; once LAD opened diffuse 80 - 90% mid LAD stenosis in a small vessel.   50% mid, 80% distal before PDA takeoff.  A Xience DES was inserted in the LAD and PTCA to the diffusely diseased MID LAD.  EF 45-50% by cath and 40-45% by Echo. with mid anterolateral hypokinesis.  20% left renal artery narrowing. She was started on ASA and Effient.  Internal medicine was consulted for diabetes management.  The patient's A1C is 13.6.  She was started on an ACE-I and Imdur.  She was taken for repeat LHC to assess stability and determine potential for addition PCI.  Recommendation from Dr. Herbie Baltimore was optimized medical management.  Ranexa was added.  Lopressor and Lisinopril were titrated up.  She was started on Lantus 10 units at bedtime then titrated by IM with the addition of Novolog 3x daily.  Cardiac rehab and the diabetes  coordinator met and worked with the patient.   Will consider OP myoview to evaluate for angina & guide PCI based on evidence of ischemia.  Salt restriction was discussed.  Neurology was consulted for CVA.   MRI heaad revealed multiple small areas of acute infarct in the various vascular distributions.  Neurology recommend ASA.  FU with Neuro.  She was discharged in stable condition after being seen by Dr. Herbie Baltimore.  Consults: Internal Medicine, Nutrition, Neurology  Significant Diagnostic Studies:  2D Echo: Study Conclusions  - Left ventricle: The cavity size was at the upper limits of normal. Wall thickness was normal. Systolic function was mildly to moderately reduced. The estimated ejection fraction was in the range of 40% to 45%. Doppler parameters are consistent with abnormal left ventricular relaxation (grade 1 diastolic dysfunction). - Regional wall motion abnormality: Moderate hypokinesis of the mid anteroseptal myocardium; mild hypokinesis of the apical anterior and apical myocardium; possible mild hypokinesis of the mid anterior and apical lateral myocardium. - Aortic valve: Mild focal calcification and sclerosis without stenosis involving the noncoronary cusp, consistent with sclerosis.  STEMI LHC DICTATION # [id number] , [id number]  Ao: 162/92  LV: 162/11  LM: nl  LAD: 100% occlusion after Dx1 and Septal1 with Timi 0 flow; 80% ostial and Mid Dx; once LAD opened diffuse 80 - 90% mid LAD stenosis in a small vessel  LCX: nl  RCA: 50% mid, 80% distal before PDA takeoff  PTCA/Stent LAD at occluded site with 2.25 x 15 Xience DES  stent to 2.30 mm;  PTCA to diffusey diseased mid LAD with long infations up to 2.14 maximally, not stented with TIMI 3 flow returned.  EF: 45 - 50% with mild anterolateral hypokinesis.  20% L renal narrowing  DTB: 27 minutes from arrival to [facility] from [facility]; continue angiomax at reduced dose, effient 60 mg given, IV and IC  NTG.  Will need staged PCI.  Lennette Bihari, MD, [facility]  09/07/2012  5:57 PM   Hemodynamics:  Central Aortic / Mean Pressures: 120/70 mmHg; 92 mmHg  Left Ventricular Pressures / EDP: 118/5 mmHg; 13 mmHg Left Ventriculography: Not done  Coronary Anatomy: See drawing by Dr. Tresa Endo post PCI, scanned.  Left Main: Moderate caliber -> bifrucates to LAD & Circumflex LAD: Moderate caliber vessel with proximal Diag followed by patent stent. The LAD beyond the stent tapers to a <46mm vessel with diffuse lesions 40-70% throughout, most notable at tiny D2 takeoff where there appears to be a small "shelf-like" lesion with what may be a small dissection flap. TIMI 3 flow.  D1: Small caliber vessel with proximal & mid ~70-80% lesions in a ~1.5-2.0 mm vessel  D2: tiny vessel with diffuse disease Left Circumflex: Moderate to large caliber vessel that terminates as a bifurcating lateral OM, but has several small OMBs in the mid vessel. Only minimal luminal irregularities  RCA: Similar in appearance to STEMI angiography. ~50% mid & ~70% distal pre PDA. PDA & extensive RPL system with minimal luminal irregularites  ANESTHESIA: Local Lidocaine 18 ml  SEDATION: 25 mcg IV fentanyl  MEDICATIONS: Omnipaque contrast 40ml  Intracoronary NTG injection 200 mcg x 2  EBL: < 5 ml  PATIENT DISPOSITION:  The patient was transferred to the PACU holding area in a hemodynamicaly stable, chest pain free condition.  Sheath to be removed in PACU/CCU.  The patient tolerated the procedure well, and there were no complications.  The patient was stable before, during, and after the procedure. POST-OPERATIVE DIAGNOSIS:  Stable post PCI anatomy with diffuse distal LAD disease & a suggestion of small dissection distal to tiny D2. Patent LAD stent with TIMI 3 flow in all vessels.  Stable D2 lesions - in a 1.5-~2.0 mm vessel, unlikely cause of resting angina -- would defer to Dr. Tresa Endo, but not very good PCI targets.  Stable RCA lesion -  non-flow limiting. PLAN OF CARE:  Back to CCU & continue current Rx - wean NTG o/n to PO nitrate  Add Ranexa  Would strongly consider Optimization of Med Rx as opposed to staged PCI as least for now. Would hope that the distal LAD flow will improve, but both the distal LAD & D1 lesions are borderline PCI targets & the RCA lesion is moderate to severe & not flow limiting.  Only consider PCI if true Angina occurs on Med Rx. Marykay Lex, M.D., M.S.  [facility]  [address]  [place], [place] [address]  [phone]  09/09/2012  CT ANGIOGRAPHY HEAD AND NECK  Technique: Multidetector CT imaging of the head and neck was  performed using the standard protocol during bolus administration  of intravenous contrast. Multiplanar CT image reconstructions  including MIPs were obtained to evaluate the vascular anatomy.  Carotid stenosis measurements (when applicable) are obtained  utilizing NASCET criteria, using the distal internal carotid  diameter as the denominator.  Contrast: 80mL OMNIPAQUE IOHEXOL 350 MG/ML SOLN  Comparison: 09/12/2012 MR.  CTA NECK  Findings: Heterogeneous thyroid gland with  1 cm of left lobe  nodule can be followed with thyroid ultrasound.  Visualized lung apices clear.  Normal configuration of the origin of the great vessels from the  aortic arch.  Prominent ectasia left common carotid artery. No hemodynamically  significant stenosis left carotid bifurcation. Prominent ectasia  left internal carotid artery proximal to the skull base without  findings of fibromuscular dysplasia.  Prominent ectasia proximal right common carotid artery. No  significant stenosis of the right carotid bifurcation.  No significant stenosis of either subclavian artery.  Right vertebral artery is dominant. No significant stenosis of the  right vertebral artery. Mild to slightly moderate narrowing with  ectasia proximal left vertebral artery.    Cervical spondylotic changes.  Review of the MIP images confirms the above findings.  IMPRESSION:  No evidence hemodynamically significant stenosis or evidence or  dissection involving either carotid artery. Prominent ectasia of  the common carotid artery bilaterally and distal left internal  carotid artery.  Mild to slightly moderate narrowing with ectasia proximal left  vertebral artery (which is the nondominant vertebral artery). No  evidence of vertebral artery dissection. No significant narrowing  of the right vertebral artery.  Heterogeneous thyroid gland with 1 cm of left lobe nodule can be  followed with thyroid ultrasound.  CTA HEAD  Findings: Acute and chronic infarcts as noted on the recent MR  without evidence of intracranial hemorrhage.  No intracranial mass lesion or abnormal enhancement.  Right vertebral artery is dominant. Mild to slightly moderate  irregularity of the right vertebral artery. Moderate irregularity  and narrowing involving portions of the left vertebral artery.  Moderate to slightly marked focal stenosis of the distal basilar  artery just proximal to the takeoff of the superior cerebellar  arteries. Mild to moderate narrowing of the basilar artery  proximal to this level.  Mild to moderate irregularity of the PICAs. Poor delineation of  the AICAs.  Mild irregularity of the superior cerebellar arteries bilaterally.  Mild to slightly moderate narrowing posterior cerebral arteries  more notable on the right.  Mild narrowing cavernous segment of the internal carotid artery  bilaterally. Mild to moderate narrowing supraclinoid segment left  internal carotid artery with mild narrowing supraclinoid segment  right internal carotid artery.  Moderate to marked narrowing proximal A1 segment left anterior  cerebral artery. Mild narrowing A1 segment right anterior cerebral  artery.  Mild narrowing M1 segment middle cerebral artery bilaterally.  Middle  cerebral artery mild to moderate branch vessel irregularity  bilaterally.  No aneurysm or vascular malformation noted.  Nonspecific scalp lesion.  Review of the MIP images confirms the above findings.  IMPRESSION:  Intracranial atherosclerotic type changes most notable involving  the basilar artery, left vertebral artery, supraclinoid segment of  the left internal carotid artery and A1 segment left anterior  cerebral artery as detailed above.  MRI HEAD WITHOUT AND WITH CONTRAST  Technique: Multiplanar, multiecho pulse sequences of the brain and  surrounding structures were obtained according to standard protocol  without and with intravenous contrast  Contrast: 15mL MULTIHANCE GADOBENATE DIMEGLUMINE 529 MG/ML IV SOLN  Comparison: none  Findings: Multiple areas of acute infarct are present in various  vascular distributions compatible with cerebral emboli. Small  acute infarct in the left inferior cerebellum. Small acute  infarcts in the right occipital lobe, left parietal white matter,  left frontal pericallosal white matter, left frontal subcortical  white matter, and right parietal cortex over the convexity. In  addition, there is a chronic infarct  in the right inferior  cerebellar white matter. There is a chronic infarct in the right  thalamus and right occipital lobe and chronic ischemic changes in  the cerebral white matter bilaterally. There is a small area of  chronic hemorrhage in the right inferior cerebellum as well as in  the right temporal parietal periventricular white matter and left  medial temporal lobe.  Negative for mass or edema. Ventricle size is normal. No midline  shift.  Postcontrast imaging reveals normal enhancement. No intracranial  mass lesion is identified.  Mild mucosal edema in the paranasal sinuses without air-fluid  level.  IMPRESSION:  Multiple small areas of acute infarct in the various vascular  distributions. The pattern suggests cerebral  emboli.  There are multiple areas of chronic ischemia present in the white  matter bilaterally in the right cerebellum. Several areas of  chronic micro hemorrhage are present.  CBC    Component Value Date/Time   WBC 6.6 09/13/2012 0505   RBC 4.36 09/13/2012 0505   HGB 12.4 09/13/2012 0505   HCT 36.7 09/13/2012 0505   PLT 220 09/13/2012 0505   MCV 84.2 09/13/2012 0505   MCH 28.4 09/13/2012 0505   MCHC 33.8 09/13/2012 0505   RDW 13.0 09/13/2012 0505    BMET    Component Value Date/Time   NA 135 09/13/2012 0505   K 3.7 09/13/2012 0505   CL 101 09/13/2012 0505   CO2 26 09/13/2012 0505   GLUCOSE 274* 09/13/2012 0505   BUN 9 09/13/2012 0505   CREATININE 0.44* 09/13/2012 0505   CALCIUM 9.1 09/13/2012 0505   GFRNONAA >90 09/13/2012 0505   GFRAA >90 09/13/2012 0505    Lipid Panel     Component Value Date/Time   CHOL 216* 09/08/2012 0418   TRIG 68 09/08/2012 0418   HDL 60 09/08/2012 0418   CHOLHDL 3.6 09/08/2012 0418   VLDL 14 09/08/2012 0418   LDLCALC 142* 09/08/2012 0418   A1C:  13.6  Treatments: See above and med list below.  Discharge Exam: Blood pressure 121/77, pulse 76, temperature 98.3 F (36.8 C), temperature source Oral, resp. rate 18, height 5\' 5"  (1.651 m), weight 63.8 kg (140 lb 10.5 oz), SpO2 97.00%.   Disposition: 01-Home or Self Care  Discharge Orders    Future Orders Please Complete By Expires   Amb Referral to Cardiac Rehabilitation      Diet - low sodium heart healthy      Increase activity slowly          Medication List     As of 09/23/2012  8:57 AM    TAKE these medications         aspirin 81 MG EC tablet   Take 1 tablet (81 mg total) by mouth daily.      atorvastatin 80 MG tablet   Commonly known as: LIPITOR   Take 1 tablet (80 mg total) by mouth daily at 6 PM.      ibuprofen 200 MG tablet   Commonly known as: ADVIL,MOTRIN   Take 200 mg by mouth every 6 (six) hours as needed. For pain      insulin aspart 100 UNIT/ML  injection   Commonly known as: novoLOG   Inject 10 Units into the skin 3 (three) times daily with meals.      insulin glargine 100 UNIT/ML injection   Commonly known as: LANTUS   Inject 31 Units into the skin at bedtime.      isosorbide mononitrate 60 MG  24 hr tablet   Commonly known as: IMDUR   Take 1 tablet (60 mg total) by mouth daily.      lisinopril 10 MG tablet   Commonly known as: PRINIVIL,ZESTRIL   Take 1 tablet (10 mg total) by mouth daily.      metFORMIN 750 MG 24 hr tablet   Commonly known as: GLUCOPHAGE-XR   Take 1 tablet (750 mg total) by mouth daily with breakfast.      metoprolol 100 MG tablet   Commonly known as: LOPRESSOR   Take 1 tablet (100 mg total) by mouth 2 (two) times daily.      nitroGLYCERIN 0.4 MG SL tablet   Commonly known as: NITROSTAT   Place 1 tablet (0.4 mg total) under the tongue every 5 (five) minutes x 3 doses as needed for chest pain.      prasugrel 10 MG Tabs   Commonly known as: EFFIENT   Take 1 tablet (10 mg total) by mouth daily.      ranolazine 500 MG 12 hr tablet   Commonly known as: RANEXA   Take 1 tablet (500 mg total) by mouth 2 (two) times daily.           Follow-up Information    Follow up with Lennette Bihari, MD. (Our office will call you with the appt. date and time)    Contact information:   [address] [place] [address] [phone]          Signed: [name] 09/23/2012, 8:57 AM  I saw and evaluated the patient this morning along with [name], PA.  I agree with his d/c summary as dictated above.  Relatively stable from a cardiac standpoint -- no further Angina on DAPT, Nitrate, ACE-I & BB along with Ranexa (for small vessel CAD) & statin. I am in favor of medical therapy of existing residual CAD with plan to consider OP Myoview to assess for additional areas of ischemia prior to considering PTCA/PCI of her small vessel CAD. ROV with Dr. Wonda Cheng in ~2-3 weeks.  DM - Appreciate TRH input /  assistance with glycemic control regimen. DM counseling is vital. Still with CBGs from 200s to 160. Given her A1c - I suspect that she would end up being on insul  Neuro - likely had shower of emboli, ? If associated with anterior STEMI & possible apical thrombus. No residual thrombus noted on Echo. She is on DAPT. MRA head & neck pending. Neurology consultation recommendations pending.  BP & HR stable - no arrythmia & no CHF.  From a Cardiology standpoint, she is essentially ready for d/c -- pending word from Neurology & [facility] consultants for additional recommendations prior to discharge.   Marykay Lex, M.D., M.S. [facility] [address] [place], [place]  [address]  [phone] Pager # [phone] 09/23/2012 1:20 PM

## 2012-10-01 ENCOUNTER — Other Ambulatory Visit (HOSPITAL_COMMUNITY): Payer: Self-pay | Admitting: Cardiovascular Disease

## 2012-10-01 DIAGNOSIS — I2510 Atherosclerotic heart disease of native coronary artery without angina pectoris: Secondary | ICD-10-CM

## 2012-10-03 ENCOUNTER — Encounter: Payer: Self-pay | Admitting: Internal Medicine

## 2012-10-03 ENCOUNTER — Ambulatory Visit (INDEPENDENT_AMBULATORY_CARE_PROVIDER_SITE_OTHER): Payer: No Typology Code available for payment source | Admitting: Internal Medicine

## 2012-10-03 VITALS — BP 130/82 | HR 79 | Temp 98.3°F | Resp 16 | Wt 137.8 lb

## 2012-10-03 DIAGNOSIS — Z1231 Encounter for screening mammogram for malignant neoplasm of breast: Secondary | ICD-10-CM | POA: Insufficient documentation

## 2012-10-03 DIAGNOSIS — IMO0001 Reserved for inherently not codable concepts without codable children: Secondary | ICD-10-CM

## 2012-10-03 DIAGNOSIS — Z0000 Encounter for general adult medical examination without abnormal findings: Secondary | ICD-10-CM | POA: Insufficient documentation

## 2012-10-03 DIAGNOSIS — Z23 Encounter for immunization: Secondary | ICD-10-CM

## 2012-10-03 DIAGNOSIS — I10 Essential (primary) hypertension: Secondary | ICD-10-CM

## 2012-10-03 DIAGNOSIS — G252 Other specified forms of tremor: Secondary | ICD-10-CM

## 2012-10-03 DIAGNOSIS — E1165 Type 2 diabetes mellitus with hyperglycemia: Secondary | ICD-10-CM

## 2012-10-03 DIAGNOSIS — G250 Essential tremor: Secondary | ICD-10-CM

## 2012-10-03 LAB — GLUCOSE, POCT (MANUAL RESULT ENTRY): POC Glucose: 197 mg/dl — AB (ref 70–99)

## 2012-10-03 LAB — HM DIABETES FOOT EXAM: HM Diabetic Foot Exam: NORMAL

## 2012-10-03 MED ORDER — GLUCOSE BLOOD VI STRP: ORAL_STRIP | Status: DC

## 2012-10-03 MED ORDER — INSULIN GLARGINE 100 UNIT/ML ~~LOC~~ SOLN: 31.0000 [IU] | Freq: Every day | SUBCUTANEOUS | Status: DC

## 2012-10-03 NOTE — Assessment & Plan Note (Signed)
Her BP is well controlled today 

## 2012-10-03 NOTE — Patient Instructions (Signed)

## 2012-10-03 NOTE — Assessment & Plan Note (Signed)
Neuro referral, she wants to know if there is a med that will help her

## 2012-10-03 NOTE — Assessment & Plan Note (Signed)
Her blood sugars have improved with the current treatment It is too early to recheck her A1C I have asked her to see diabetic education

## 2012-10-03 NOTE — Progress Notes (Signed)
Subjective:    Patient ID: Marisa Forbes, female    DOB: 03/29/1951, 61 y.o.   MRN: [id number]  Diabetes She presents for her initial diabetic visit. She has type 2 diabetes mellitus. The initial diagnosis of diabetes was made 1 month ago. Her disease course has been improving. Hypoglycemia symptoms include tremors. Pertinent negatives for hypoglycemia include no dizziness, headaches, pallor, seizures or speech difficulty. Associated symptoms include polydipsia, polyphagia and polyuria. Pertinent negatives for diabetes include no blurred vision, no chest pain, no fatigue, no foot paresthesias, no foot ulcerations, no visual change, no weakness and no weight loss. There are no hypoglycemic complications. Diabetic complications include a CVA and heart disease. Current diabetic treatment includes intensive insulin program, insulin injections and oral agent (dual therapy). She is compliant with treatment all of the time. Her weight is stable. She is following a generally healthy diet. Meal planning includes avoidance of concentrated sweets. She has not had a previous visit with a dietician. She never participates in exercise. Her home blood glucose trend is decreasing steadily. Her breakfast blood glucose range is generally 110-130 mg/dl. Her lunch blood glucose range is generally 130-140 mg/dl. Her dinner blood glucose range is generally 130-140 mg/dl. Her highest blood glucose is 140-180 mg/dl. Her overall blood glucose range is 130-140 mg/dl. An ACE inhibitor/angiotensin II receptor blocker is being taken. She does not see a podiatrist.Eye exam is current.      Review of Systems  Constitutional: Negative.  Negative for weight loss and fatigue.  HENT: Negative.   Eyes: Negative.  Negative for blurred vision.  Respiratory: Negative for cough, chest tightness, shortness of breath, wheezing and stridor.   Cardiovascular: Negative for chest pain, palpitations and leg swelling.  Gastrointestinal: Negative  for nausea, vomiting, abdominal pain, diarrhea, constipation and blood in stool.  Genitourinary: Positive for polyuria.  Musculoskeletal: Negative.  Negative for myalgias and arthralgias.  Skin: Negative for color change, pallor, rash and wound.  Neurological: Positive for tremors. Negative for dizziness, seizures, syncope, facial asymmetry, speech difficulty, weakness, light-headedness, numbness and headaches.  Hematological: Positive for polydipsia and polyphagia. Negative for adenopathy. Does not bruise/bleed easily.  Psychiatric/Behavioral: Negative.        Objective:   Physical Exam  Vitals reviewed. Constitutional: She is oriented to person, place, and time. She appears well-developed and well-nourished. No distress.  HENT:  Head: Normocephalic and atraumatic.  Mouth/Throat: Oropharynx is clear and moist. No oropharyngeal exudate.  Eyes: Conjunctivae normal are normal. Right eye exhibits no discharge. Left eye exhibits no discharge. No scleral icterus.  Neck: Normal range of motion. Neck supple. No JVD present. No tracheal deviation present. No thyromegaly present.  Cardiovascular: Normal rate, regular rhythm, normal heart sounds and intact distal pulses.  Exam reveals no gallop and no friction rub.   No murmur heard. Pulmonary/Chest: Effort normal and breath sounds normal. No stridor. No respiratory distress. She has no wheezes. She has no rales. She exhibits no tenderness.  Abdominal: Soft. Bowel sounds are normal. She exhibits no distension and no mass. There is no tenderness. There is no rebound and no guarding.  Musculoskeletal: Normal range of motion. She exhibits no edema and no tenderness.  Lymphadenopathy:    She has no cervical adenopathy.  Neurological: She is alert and oriented to person, place, and time. She has normal reflexes. She displays tremor (head tremor at rest). She displays no atrophy and normal reflexes. No cranial nerve deficit or sensory deficit. She exhibits  normal muscle tone. She displays a  negative Romberg sign. She displays no seizure activity. Coordination and gait normal.  Skin: Skin is warm and dry. No rash noted. She is not diaphoretic. No erythema. No pallor.  Psychiatric: She has a normal mood and affect. Her behavior is normal. Judgment and thought content normal.      Lab Results  Component Value Date   WBC 6.6 09/13/2012   HGB 12.4 09/13/2012   HCT 36.7 09/13/2012   PLT 220 09/13/2012   GLUCOSE 274* 09/13/2012   CHOL 216* 09/08/2012   TRIG 68 09/08/2012   HDL 60 09/08/2012   LDLCALC 142* 09/08/2012   NA 135 09/13/2012   K 3.7 09/13/2012   CL 101 09/13/2012   CREATININE 0.44* 09/13/2012   BUN 9 09/13/2012   CO2 26 09/13/2012   TSH 0.369 09/07/2012   INR 1.09 09/09/2012   HGBA1C 13.6* 09/07/2012      Assessment & Plan:

## 2012-10-04 ENCOUNTER — Telehealth: Payer: Self-pay | Admitting: Internal Medicine

## 2012-10-10 ENCOUNTER — Encounter: Payer: No Typology Code available for payment source | Attending: Internal Medicine | Admitting: *Deleted

## 2012-10-10 ENCOUNTER — Ambulatory Visit: Payer: No Typology Code available for payment source | Admitting: *Deleted

## 2012-10-10 ENCOUNTER — Encounter: Payer: Self-pay | Admitting: *Deleted

## 2012-10-10 VITALS — Ht 65.0 in | Wt 138.5 lb

## 2012-10-10 DIAGNOSIS — Z713 Dietary counseling and surveillance: Secondary | ICD-10-CM | POA: Insufficient documentation

## 2012-10-10 DIAGNOSIS — E119 Type 2 diabetes mellitus without complications: Secondary | ICD-10-CM | POA: Insufficient documentation

## 2012-10-10 DIAGNOSIS — E1165 Type 2 diabetes mellitus with hyperglycemia: Secondary | ICD-10-CM

## 2012-10-10 NOTE — Patient Instructions (Addendum)
Goals:  Follow Diabetes Meal Plan as instructed (yellow meal planning card)  Eat 3 meals and 2 snacks, every 3-5 hrs  Limit carbohydrate intake to 30 grams carbohydrate/meal and 15 grams carbohydrate/snack  Add lean protein foods to all meals/snacks  Aim for 30 mins of physical activity daily as approved by MD  Bring food record and glucose log to your next nutrition visit  Aim for (daily):   25-35 grams of fiber  200 mg or less of dietary cholesterol  2000 mg or less of salt/sodium  1200 calories  135 g of carbs  80 g protein   35-40 g fat (10 g or less as saturated fat)

## 2012-10-10 NOTE — Progress Notes (Addendum)
  Medical Nutrition Therapy:  Appt start time: 1145  End time:  1350.  Assessment:   T2DM, uncontrolled (250.02).  "Marisa Forbes" comes today with her husband for T2DM education and MNT.  She is 1 mo s/p heart attack w/ multiple strokes (09/07/12) and was subsequently dx with T2DM d/t A1c of 13.6%.  Reports losing ~80 lbs in the last 1.5 yrs by eating healthier (i.e. no fried foods, etc), watching portion sizes, and drinking mainly water.  Recent FBGs range from 100-105 mg and reports some recent hypoglycemic episodes. Verbalizes understanding of how to correct.  Advised to call PCP immediately to alert. Unable to obtain full dietary recall d/t husband's multiple questions regarding serving sizes and requests for meal suggestions. Overall diet WNL.   MEDICATIONS: See medication list; reconciled with patient   DIETARY INTAKE:  Usual eating pattern includes 3 meals and 0-2 snacks per day.  Partial 24-hr recall:  B ( AM): 1 cup oatmeal w/ 1 cup skim milk  Snk ( AM): nuts  L ( PM):  unknown Snk ( PM):  unknown D ( PM): Wheat sandwich thin w/ 3-4 oz broiled or sauteed fish  Snk ( PM): None or bowl of cereal Beverages: Mostly water  Usual physical activity: None; Starting Cardiac PT next week  Estimated energy needs: 1200 calories (to maintain weight - per pt request) 135 g carbohydrates (30 g per meal/15g per snack) 75-80 g protein 35-40 g fat (<10g as saturated fat) <2000 mg sodium 25-35 g fiber <200 mg dietary cholesterol  Progress Towards Goal(s):  In progress.   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  As evidenced by recent A1c of 13% and diagnosis of T2DM.    Intervention:  Nutrition education. Discussed CHO counting, CHO metabolism, and meal planning. Also discussed heart healthy diet, portion sizes, and cooking methods. See patient goals.   Handouts given during visit include:  Living Well with Diabetes - [employer]  Meal Planning  Card  Carb Counting & Meal Planning Guide - Novo Nordisk  Target Blood Glucose Levels  Low CHO Snack List  Samples given during visit include:   Boost Glucose Control: 2 bottles Lot # [id number]; Exp: 02/15/13  Premier Protein: 1 bottle Lot # [id number]; Exp: 08/17/12  Carnation Breakfast Essentials: 3 pkts Lot # [id number]; Exp: 03/09/13  Monitoring/Evaluation:  Dietary intake, exercise, A1c, BG trends, and body weight prn per pt. Cannot afford to come back d/t high insurance deductible.  Verbalizes understanding to call or email me or her PCP with ANY questions.

## 2012-10-12 ENCOUNTER — Telehealth: Payer: Self-pay | Admitting: *Deleted

## 2012-10-12 NOTE — Telephone Encounter (Signed)
Pt called stating solostar pens were given to her at last OV but she was not told to refrigerate them and was concerned that the pen was no longer good. I advised Pt that pens do not have to be refrigerated and as long as kept at room temp they are good for about 28 days.

## 2012-10-14 ENCOUNTER — Encounter: Payer: Self-pay | Admitting: *Deleted

## 2012-10-17 ENCOUNTER — Ambulatory Visit (HOSPITAL_COMMUNITY): Payer: No Typology Code available for payment source

## 2012-10-18 ENCOUNTER — Encounter: Payer: Self-pay | Admitting: Neurology

## 2012-10-18 ENCOUNTER — Ambulatory Visit (INDEPENDENT_AMBULATORY_CARE_PROVIDER_SITE_OTHER): Payer: No Typology Code available for payment source | Admitting: Neurology

## 2012-10-18 ENCOUNTER — Ambulatory Visit (HOSPITAL_COMMUNITY)
Admission: RE | Admit: 2012-10-18 | Discharge: 2012-10-18 | Disposition: A | Discharge status: Home / Self Care | Payer: No Typology Code available for payment source | Source: Ambulatory Visit | Attending: Cardiovascular Disease | Admitting: Cardiovascular Disease

## 2012-10-18 VITALS — BP 166/90 | HR 80 | Temp 97.8°F | Resp 16 | Ht 65.0 in | Wt 137.0 lb

## 2012-10-18 DIAGNOSIS — E119 Type 2 diabetes mellitus without complications: Secondary | ICD-10-CM | POA: Insufficient documentation

## 2012-10-18 DIAGNOSIS — I669 Occlusion and stenosis of unspecified cerebral artery: Secondary | ICD-10-CM

## 2012-10-18 DIAGNOSIS — G629 Polyneuropathy, unspecified: Secondary | ICD-10-CM | POA: Insufficient documentation

## 2012-10-18 DIAGNOSIS — G243 Spasmodic torticollis: Secondary | ICD-10-CM

## 2012-10-18 DIAGNOSIS — E1165 Type 2 diabetes mellitus with hyperglycemia: Secondary | ICD-10-CM

## 2012-10-18 DIAGNOSIS — I10 Essential (primary) hypertension: Secondary | ICD-10-CM | POA: Insufficient documentation

## 2012-10-18 DIAGNOSIS — I2510 Atherosclerotic heart disease of native coronary artery without angina pectoris: Secondary | ICD-10-CM | POA: Insufficient documentation

## 2012-10-18 DIAGNOSIS — G609 Hereditary and idiopathic neuropathy, unspecified: Secondary | ICD-10-CM

## 2012-10-18 MED ORDER — TECHNETIUM TC 99M SESTAMIBI GENERIC - CARDIOLITE
10.2000 | Freq: Once | INTRAVENOUS | Status: AC | PRN
  Administered 2012-10-18: 10 via INTRAVENOUS

## 2012-10-18 MED ORDER — TECHNETIUM TC 99M SESTAMIBI GENERIC - CARDIOLITE
30.7000 | Freq: Once | INTRAVENOUS | Status: AC | PRN
  Administered 2012-10-18: 30.7 via INTRAVENOUS

## 2012-10-18 MED ORDER — REGADENOSON 0.4 MG/5ML IV SOLN
0.4000 mg | Freq: Once | INTRAVENOUS | Status: AC
  Administered 2012-10-18: 0.4 mg via INTRAVENOUS

## 2012-10-18 NOTE — Procedures (Addendum)
[facility] [address] [place] [address] [phone]  Cardiology Nuclear Med Study  [name] is a 61 y.o. female     MRN : [id number]     DOB: [birth year]  Procedure Date: 10/18/2012  Nuclear Med Background Indication for Stress Test:  Evaluation for Ischemia,PTCA/Stent Patency, and Abnormal EKG History:  09-07-12 Anterior STEMI> PTCA/Stent LAD Cardiac Risk Factors: Hypertension and NIDDM  Symptoms: No symptoms   Nuclear Pre-Procedure Caffeine/Decaff Intake:  10:00pm NPO After: 8:00am   IV Site: R Antecubital x 1, tolerated well IV 0.9% NS with Angio Cath:  22g  Chest Size (in):  38 IV Started by: Irean Hong, RN  Height: 5\' 5"  (1.651 m)  Cup Size: D  BMI:  Body mass index is 22.96 kg/(m^2). Weight:  138 lb (62.596 kg)   Tech Comments:  Held Lopressor and Isosorbide    Nuclear Med Study 1 or 2 day study: 1 day  Stress Test Type:  Pension scheme manager  Order Authorizing Provider:  Nicki Guadalajara, MD   Resting Radionuclide: Technetium 14m Sestamibi  Resting Radionuclide Dose: 10.2 mCi   Stress Radionuclide:  Technetium 84m Sestamibi  Stress Radionuclide Dose: 30.7 mCi           Stress Protocol Rest HR: 87 Stress HR: 123  Rest BP: 196/113 Stress BP: 209/110  Exercise Time (min): n/a METS: n/a   Predicted Max HR: 159 bpm % Max HR: 77.36 bpm Rate Pressure Product: 32440   Dose of Adenosine (mg):  n/a Dose of Lexiscan: 0.4 mg  Dose of Atropine (mg): n/a Dose of Dobutamine: n/a mcg/kg/min (at max HR)  Stress Test Technologist: Esperanza Sheets, CCT Nuclear Technologist: Gonzella Lex, CNMT   Rest Procedure:  Myocardial perfusion imaging was performed at rest 45 minutes following the intravenous administration of Technetium 67m Sestamibi. Stress Procedure:  The patient received IV Lexiscan 0.4 mg over 15-seconds.  Technetium 64m Sestamibi injected at 30-seconds.  There were no significant changes with Lexiscan.   Quantitative spect images were obtained after a 45 minute delay.  Transient Ischemic Dilatation (Normal <1.22):  1.01 Lung/Heart Ratio (Normal <0.45):  0.31 QGS EDV:  96 ml QGS ESV:  56 ml LV Ejection Fraction: 42%    Rest ECG: NSR with QS V1-4 c/w anterior MI  Stress ECG: No significant change from baseline ECG and There are scattered PVCs.  QPS Raw Data Images:  Normal; no motion artifact; normal heart/lung ratio. Stress Images:  Moderate is size severe in intensity defect at apex and mid-distal anterior and distal septal wall; Extent 26% Rest Images:  Comparison with the stress images reveals no significant change. Subtraction (SDS):  No evidence of ischemia.  Impression Exercise Capacity:  Lexiscan with no exercise. BP Response:  Hypertensive blood pressure response. Clinical Symptoms:  Mild shortness of breath. ECG Impression:  No significant ST segment change suggestive of ischemia. Comparison with Prior Nuclear Study: No previous nuclear study performed  Overall Impression:  Intermediate stress nuclear study demonstrating scar in the mid distal and apical LD territory without significant ischemia.  LV Wall Motion:  Severe hypokinesis of apical and distal septal walls, EF 42%   KELLY,THOMAS A, MD  10/18/2012 3:27 PM

## 2012-10-18 NOTE — Progress Notes (Signed)
Subjective:   Marisa Forbes was seen in consultation in the movement disorder clinic at the request of [name], MD.  The evaluation is for tremor.  This patient is accompanied in the office by her spouse who supplements the history.  The tremor is present in the head and has been going on for about 2 years.  It does not bother the patient.  She has no neck pain with it.  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  Stress may make tremor worse.  It tends to shake in the "no" direction.  Her father had PD and had tremor of the hands.  She has no hand tremor.  She has never been on any medication for head tremor.    I did have the opportunity to review the patients recent hospital records.  The patient presented to the emergency room on November 9 with chest pain.  She was diagnosed with acute myocardial infarction, and subsequent catheterization.  She also had multiple cerebral infarctions involving various vascular territories that were determined to be embolic.  She saw both Dr. Anne Hahn and Dr. Lance Muss from [facility] and was placed on aspirin.  She is to followup with them in January.  Prior to this hospitalization, she was noncompliant with her medications.  She was off of her blood pressure and cholesterol medication.  Upon admission to the hospital her hemoglobin A1c was 13.6.  This was a new diagnosis of diabetes for her.  She was told years ago that she had borderline diabetes but if she lost weight it would go away.  She did lose 80 pounds over the last year or year and half.  Current/Previously tried tremor medications: None  Current medications that may exacerbate tremor:  None  Outside reports reviewed: hospital records.  No Known Allergies  Current Outpatient Prescriptions on File Prior to Visit  Medication Sig Dispense Refill  . aspirin EC 81 MG EC tablet Take 1 tablet (81 mg total) by mouth daily.      Marland Kitchen atorvastatin  (LIPITOR) 80 MG tablet Take 1 tablet (80 mg total) by mouth daily at 6 PM.  30 tablet  5  . glucose blood (ONETOUCH VERIO) test strip Use TID  100 each  12  . ibuprofen (ADVIL,MOTRIN) 200 MG tablet Take 200 mg by mouth every 6 (six) hours as needed. For pain      . insulin aspart (NOVOLOG) 100 UNIT/ML injection Inject 10 Units into the skin 3 (three) times daily with meals.  1 vial  11  . insulin glargine (LANTUS SOLOSTAR) 100 UNIT/ML injection Inject 31 Units into the skin at bedtime.  9 mL  12  . isosorbide mononitrate (IMDUR) 60 MG 24 hr tablet Take 1 tablet (60 mg total) by mouth daily.  30 tablet  5  . lisinopril (PRINIVIL,ZESTRIL) 10 MG tablet Take 20 mg by mouth daily.      . metFORMIN (GLUCOPHAGE-XR) 750 MG 24 hr tablet Take 1 tablet (750 mg total) by mouth daily with breakfast.  30 tablet  5  . metoprolol (LOPRESSOR) 100 MG tablet Take 1 tablet (100 mg total) by mouth 2 (two) times daily.  60 tablet  5  . nitroGLYCERIN (NITROSTAT) 0.4 MG SL tablet Place 1 tablet (0.4 mg total) under the tongue every 5 (five) minutes x 3 doses as needed for chest pain.  25 tablet  5  . ranolazine (RANEXA)  500 MG 12 hr tablet Take 1 tablet (500 mg total) by mouth 2 (two) times daily.  60 tablet  5  . Ticagrelor (BRILINTA) 90 MG TABS tablet Take 1 tablet (90 mg total) by mouth 2 (two) times daily.  60 tablet  11    Past Medical History  Diagnosis Date  . HTN (hypertension)   . Diabetes mellitus without complication   . Stroke, embolic   . Myocardial infarction 09/07/12    History reviewed. No pertinent past surgical history.  History   Social History  . Marital Status: Divorced    Spouse Name: N/A    Number of Children: N/A  . Years of Education: N/A   Occupational History  . Not on file.   Social History Main Topics  . Smoking status: Never Smoker   . Smokeless tobacco: Never Used  . Alcohol Use: No  . Drug Use: No  . Sexually Active: Yes   Other Topics Concern  . Not on file    Social History Narrative  . No narrative on file    Family Status  Relation Status Death Age  . Mother Deceased     Emphysema  . Father Deceased     Brain aneurysm  . Brother Alive   . Brother Alive     Mental retardation    Review of Systems Lost 80 lbs over the last year in attempt to change fact that she was dx with borderline DM, but still with significant DM.  A complete 10 system ROS was obtained and was negative apart from what is mentioned.   Objective:   VITALS:   Filed Vitals:   10/18/12 0858  BP: 166/90  Pulse: 80  Temp: 97.8 F (36.6 C)  Resp: 16  Height: 5\' 5"  (1.651 m)  Weight: 137 lb (62.143 kg)   Gen:  Appears stated age and in NAD. HEENT:  Normocephalic, atraumatic. The mucous membranes are moist. The superficial temporal arteries are without ropiness or tenderness. Cardiovascular: Regular rate and rhythm. Lungs: Clear to auscultation bilaterally. Neck: There are no carotid bruits noted bilaterally.  NEUROLOGICAL:  Orientation:  The patient is alert and oriented x 3.  Recent and remote memory are intact.  Attention span and concentration are normal.  Able to name objects and repeat without trouble.  Fund of knowledge is appropriate Cranial nerves: There is good facial symmetry. The pupils are equal round and reactive to light bilaterally. Fundoscopic exam reveals clear disc margins bilaterally. Extraocular muscles are intact.  She has a left homonymous hemianopsia.  Interestingly, the patient reports that she has had this since long before her stroke and related to her cataract? Speech is fluent and clear. Soft palate rises symmetrically and there is no tongue deviation. Hearing is intact to conversational tone. Tone: Tone is good throughout. Sensation: Sensation is intact to light touch and pinprick throughout (facial, trunk, extremities).  However, pinprick is decreased in a stocking distribution.  Vibration is decreased in the distal fashion.  There  is no extinction with double simultaneous stimulation.  There is no sensory dermatomal level identified.  Coordination:  The patient has no dysdiadichokinesia or dysmetria. Motor: Strength is 5/5 in the bilateral upper and lower extremities.  Shoulder shrug is equal bilaterally.  There is no pronator drift.  There are no fasciculations noted. DTR's: Deep tendon reflexes are 2/4 at the bilateral biceps, triceps, brachioradialis, patella and 1/4 at the bilateral achilles.  Plantar responses are downgoing bilaterally. Gait and Station: The patient  is able to ambulate without difficulty.  However, she does have difficulty in relating a tandem fashion.  She is able to stand in the Romberg position.   MOVEMENT EXAM: Tremor:  There is no tremor in the UE.  She has a nonrhythmic head tremor in the "no" direction.  The head is turned slightly to the left.   IMP/PLAN:  1.  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  -I talked to the patient about the diagnosis.  We discussed the diagnosis as well as pathophysiology of the disease.  We discussed treatment options as well as prognostic indicators.  Patient education was provided.  -I discussed with her that we could use Botox for the cervical dystonia and resultant tremor.  Risks, benefits, side effects and alternative therapies were discussed.  The opportunity to ask questions was given and they were answered to the best of my ability.  The patient stated that she would like to think about this.  I think this is reasonable, especially since this is not particularly bothersome for her.  I told her it may slowly get worse over the next few years, and she may want to be reevaluated. 2.  peripheral neuropathy, most likely from uncontrolled diabetes mellitus.  -We talked about safety and etiologies.  We talked about the fact that this may contribute to gait instability.  I talked about the importance of strict glucose control.  I dont think  that we need to do a further workup right now. 3.  Recent  cerebral embolism, resulting in multi-vascular territory infarctions.  Fortunately, she was asymptomatic.  Nonetheless, I talked to her about her vascular risk factors.  I asked her to followup with Dr. Lance Muss.  -I saw a L homonymous hemianopsia and I am concerned that this is from the occipital lobe infarct and not from a cataract.  I worry about her driving with this and she is to followup in the eye doctor soon. 4.  she will follow up with me on an as-needed basis. 5.  Time in room with pt was 65 min with high complexity.

## 2012-10-24 ENCOUNTER — Ambulatory Visit: Payer: No Typology Code available for payment source | Admitting: *Deleted

## 2012-11-12 ENCOUNTER — Ambulatory Visit (HOSPITAL_COMMUNITY)
Admission: RE | Admit: 2012-11-12 | Discharge: 2012-11-12 | Disposition: A | Discharge status: Home / Self Care | Payer: BC Managed Care – PPO | Source: Ambulatory Visit | Attending: Internal Medicine | Admitting: Internal Medicine

## 2012-11-12 DIAGNOSIS — Z1231 Encounter for screening mammogram for malignant neoplasm of breast: Secondary | ICD-10-CM | POA: Insufficient documentation

## 2012-11-13 LAB — HM MAMMOGRAPHY

## 2012-11-15 ENCOUNTER — Other Ambulatory Visit: Payer: Self-pay | Admitting: Internal Medicine

## 2012-11-15 DIAGNOSIS — R928 Other abnormal and inconclusive findings on diagnostic imaging of breast: Secondary | ICD-10-CM

## 2012-11-26 ENCOUNTER — Other Ambulatory Visit: Payer: Self-pay | Admitting: Internal Medicine

## 2012-11-26 ENCOUNTER — Ambulatory Visit
Admission: RE | Admit: 2012-11-26 | Discharge: 2012-11-26 | Disposition: A | Discharge status: Home / Self Care | Payer: BC Managed Care – PPO | Source: Ambulatory Visit | Attending: Internal Medicine | Admitting: Internal Medicine

## 2012-11-26 DIAGNOSIS — N6320 Unspecified lump in the left breast, unspecified quadrant: Secondary | ICD-10-CM

## 2012-11-26 DIAGNOSIS — R928 Other abnormal and inconclusive findings on diagnostic imaging of breast: Secondary | ICD-10-CM

## 2012-11-26 LAB — HM MAMMOGRAPHY: HM Mammogram: ABNORMAL

## 2012-12-03 ENCOUNTER — Ambulatory Visit
Admission: RE | Admit: 2012-12-03 | Discharge: 2012-12-03 | Disposition: A | Discharge status: Home / Self Care | Payer: BC Managed Care – PPO | Source: Ambulatory Visit | Attending: Internal Medicine | Admitting: Internal Medicine

## 2012-12-03 ENCOUNTER — Other Ambulatory Visit: Payer: Self-pay | Admitting: Internal Medicine

## 2012-12-03 DIAGNOSIS — N6320 Unspecified lump in the left breast, unspecified quadrant: Secondary | ICD-10-CM

## 2012-12-03 DIAGNOSIS — C50919 Malignant neoplasm of unspecified site of unspecified female breast: Secondary | ICD-10-CM | POA: Insufficient documentation

## 2012-12-03 HISTORY — DX: Malignant neoplasm of unspecified site of unspecified female breast: C50.919

## 2012-12-03 HISTORY — PX: BREAST BIOPSY: SHX20

## 2012-12-05 ENCOUNTER — Ambulatory Visit (INDEPENDENT_AMBULATORY_CARE_PROVIDER_SITE_OTHER): Payer: BC Managed Care – PPO | Admitting: Internal Medicine

## 2012-12-05 ENCOUNTER — Encounter: Payer: Self-pay | Admitting: Internal Medicine

## 2012-12-05 ENCOUNTER — Ambulatory Visit
Admission: RE | Admit: 2012-12-05 | Discharge: 2012-12-05 | Disposition: A | Discharge status: Home / Self Care | Payer: BC Managed Care – PPO | Source: Ambulatory Visit | Attending: Internal Medicine | Admitting: Internal Medicine

## 2012-12-05 ENCOUNTER — Other Ambulatory Visit: Payer: Self-pay | Admitting: Internal Medicine

## 2012-12-05 VITALS — BP 122/80 | HR 76 | Temp 97.9°F | Resp 16 | Wt 143.0 lb

## 2012-12-05 DIAGNOSIS — C50912 Malignant neoplasm of unspecified site of left female breast: Secondary | ICD-10-CM

## 2012-12-05 DIAGNOSIS — I10 Essential (primary) hypertension: Secondary | ICD-10-CM

## 2012-12-05 DIAGNOSIS — N6320 Unspecified lump in the left breast, unspecified quadrant: Secondary | ICD-10-CM

## 2012-12-05 DIAGNOSIS — F4321 Adjustment disorder with depressed mood: Secondary | ICD-10-CM

## 2012-12-05 MED ORDER — DIAZEPAM 5 MG PO TABS: 5.0000 mg | ORAL_TABLET | Freq: Four times a day (QID) | ORAL | Status: DC | PRN

## 2012-12-05 NOTE — Progress Notes (Signed)
Subjective:    Patient ID: Marisa Forbes, female    DOB: 11/30/1950, 62 y.o.   MRN: [id number]  Hypertension This is a chronic problem. The current episode started more than 1 year ago. The problem has been gradually worsening since onset. The problem is controlled. Associated symptoms include anxiety. Pertinent negatives include no blurred vision, chest pain, headaches, malaise/fatigue, neck pain, orthopnea, palpitations, peripheral edema, PND, shortness of breath or sweats. Past treatments include beta blockers and ACE inhibitors. The current treatment provides moderate improvement. Compliance problems include exercise and diet.  Hypertensive end-organ damage includes CAD/MI.      Review of Systems  Constitutional: Positive for fatigue. Negative for fever, chills, malaise/fatigue, diaphoresis, activity change, appetite change and unexpected weight change.  HENT: Negative.  Negative for neck pain.   Eyes: Negative.  Negative for blurred vision.  Respiratory: Negative for cough, chest tightness, shortness of breath, wheezing and stridor.   Cardiovascular: Negative for chest pain, palpitations, orthopnea, leg swelling and PND.  Gastrointestinal: Negative for nausea, vomiting, abdominal pain, diarrhea and constipation.  Genitourinary: Negative.   Musculoskeletal: Negative for myalgias, back pain, joint swelling, arthralgias and gait problem.  Skin: Negative.   Neurological: Negative for dizziness, tremors, seizures, syncope, facial asymmetry, speech difficulty, weakness, light-headedness, numbness and headaches.  Hematological: Negative for adenopathy. Does not bruise/bleed easily.  Psychiatric/Behavioral: Negative for suicidal ideas, hallucinations, behavioral problems, confusion, sleep disturbance, self-injury, dysphoric mood, decreased concentration and agitation. The patient is nervous/anxious (she has been diagnosed with breast cancer). The patient is not hyperactive.        Objective:    Physical Exam  Vitals reviewed. Constitutional: She is oriented to person, place, and time. She appears well-developed and well-nourished. No distress.  HENT:  Head: Normocephalic and atraumatic.  Mouth/Throat: Oropharynx is clear and moist. No oropharyngeal exudate.  Eyes: Conjunctivae normal are normal. Right eye exhibits no discharge. Left eye exhibits no discharge. No scleral icterus.  Neck: Normal range of motion. Neck supple. No JVD present. No tracheal deviation present. No thyromegaly present.  Cardiovascular: Normal rate, regular rhythm, normal heart sounds and intact distal pulses.  Exam reveals no gallop and no friction rub.   No murmur heard. Pulmonary/Chest: Effort normal and breath sounds normal. No stridor. No respiratory distress. She has no wheezes. She has no rales. She exhibits no tenderness.  Abdominal: Soft. Bowel sounds are normal. She exhibits no distension and no mass. There is no tenderness. There is no rebound and no guarding.  Musculoskeletal: Normal range of motion. She exhibits no edema and no tenderness.  Lymphadenopathy:    She has no cervical adenopathy.  Neurological: She is oriented to person, place, and time.  Skin: Skin is warm and dry. No rash noted. She is not diaphoretic. No erythema. No pallor.  Psychiatric: Her behavior is normal. Judgment and thought content normal. Her mood appears anxious. Her affect is not angry, not blunt, not labile and not inappropriate. Her speech is not rapid and/or pressured and not slurred. Cognition and memory are normal. She exhibits a depressed mood (tearful).     Lab Results  Component Value Date   WBC 6.6 09/13/2012   HGB 12.4 09/13/2012   HCT 36.7 09/13/2012   PLT 220 09/13/2012   GLUCOSE 274* 09/13/2012   CHOL 216* 09/08/2012   TRIG 68 09/08/2012   HDL 60 09/08/2012   LDLCALC 142* 09/08/2012   NA 135 09/13/2012   K 3.7 09/13/2012   CL 101 09/13/2012   CREATININE 0.44* 09/13/2012  BUN 9 09/13/2012    CO2 26 09/13/2012   TSH 0.369 09/07/2012   INR 1.09 09/09/2012   HGBA1C 13.6* 09/07/2012       Assessment & Plan:

## 2012-12-05 NOTE — Patient Instructions (Signed)

## 2012-12-06 ENCOUNTER — Telehealth: Payer: Self-pay | Admitting: *Deleted

## 2012-12-06 ENCOUNTER — Encounter: Payer: Self-pay | Admitting: Internal Medicine

## 2012-12-06 DIAGNOSIS — C50219 Malignant neoplasm of upper-inner quadrant of unspecified female breast: Secondary | ICD-10-CM | POA: Insufficient documentation

## 2012-12-06 NOTE — Assessment & Plan Note (Signed)
She has been diagnosed with breast cancer and she wants something to take the edge off so I gave her an Rx for valium She does not to start an antidepressant

## 2012-12-06 NOTE — Telephone Encounter (Signed)
Confirmed [facility] for 12/11/12 at 1230 .  Instructions and contact information given.

## 2012-12-06 NOTE — Assessment & Plan Note (Signed)
Her BP is well controlled 

## 2012-12-09 ENCOUNTER — Other Ambulatory Visit: Payer: BC Managed Care – PPO

## 2012-12-09 ENCOUNTER — Ambulatory Visit
Admission: RE | Admit: 2012-12-09 | Discharge: 2012-12-09 | Disposition: A | Discharge status: Home / Self Care | Payer: BC Managed Care – PPO | Source: Ambulatory Visit | Attending: Internal Medicine | Admitting: Internal Medicine

## 2012-12-09 DIAGNOSIS — C50912 Malignant neoplasm of unspecified site of left female breast: Secondary | ICD-10-CM

## 2012-12-09 MED ORDER — GADOBENATE DIMEGLUMINE 529 MG/ML IV SOLN
12.0000 mL | Freq: Once | INTRAVENOUS | Status: AC | PRN
  Administered 2012-12-09: 12 mL via INTRAVENOUS

## 2012-12-11 ENCOUNTER — Encounter: Payer: Self-pay | Admitting: Radiation Oncology

## 2012-12-11 ENCOUNTER — Ambulatory Visit: Payer: BC Managed Care – PPO

## 2012-12-11 ENCOUNTER — Ambulatory Visit (HOSPITAL_BASED_OUTPATIENT_CLINIC_OR_DEPARTMENT_OTHER): Payer: BC Managed Care – PPO | Admitting: Oncology

## 2012-12-11 ENCOUNTER — Ambulatory Visit (HOSPITAL_BASED_OUTPATIENT_CLINIC_OR_DEPARTMENT_OTHER): Payer: BC Managed Care – PPO | Admitting: General Surgery

## 2012-12-11 ENCOUNTER — Encounter: Payer: Self-pay | Admitting: *Deleted

## 2012-12-11 ENCOUNTER — Ambulatory Visit: Payer: BC Managed Care – PPO | Attending: General Surgery | Admitting: Physical Therapy

## 2012-12-11 ENCOUNTER — Encounter (INDEPENDENT_AMBULATORY_CARE_PROVIDER_SITE_OTHER): Payer: Self-pay | Admitting: General Surgery

## 2012-12-11 ENCOUNTER — Ambulatory Visit
Admission: RE | Admit: 2012-12-11 | Discharge: 2012-12-11 | Disposition: A | Discharge status: Home / Self Care | Payer: BC Managed Care – PPO | Source: Ambulatory Visit | Attending: Radiation Oncology | Admitting: Radiation Oncology

## 2012-12-11 ENCOUNTER — Other Ambulatory Visit (HOSPITAL_BASED_OUTPATIENT_CLINIC_OR_DEPARTMENT_OTHER): Payer: BC Managed Care – PPO | Admitting: Lab

## 2012-12-11 VITALS — BP 163/93 | HR 71 | Temp 98.7°F | Resp 20 | Ht 65.0 in | Wt 146.0 lb

## 2012-12-11 DIAGNOSIS — IMO0001 Reserved for inherently not codable concepts without codable children: Secondary | ICD-10-CM | POA: Insufficient documentation

## 2012-12-11 DIAGNOSIS — C50219 Malignant neoplasm of upper-inner quadrant of unspecified female breast: Secondary | ICD-10-CM

## 2012-12-11 DIAGNOSIS — C50419 Malignant neoplasm of upper-outer quadrant of unspecified female breast: Secondary | ICD-10-CM

## 2012-12-11 DIAGNOSIS — R293 Abnormal posture: Secondary | ICD-10-CM | POA: Insufficient documentation

## 2012-12-11 DIAGNOSIS — C50212 Malignant neoplasm of upper-inner quadrant of left female breast: Secondary | ICD-10-CM

## 2012-12-11 DIAGNOSIS — Z171 Estrogen receptor negative status [ER-]: Secondary | ICD-10-CM

## 2012-12-11 DIAGNOSIS — M25619 Stiffness of unspecified shoulder, not elsewhere classified: Secondary | ICD-10-CM | POA: Insufficient documentation

## 2012-12-11 DIAGNOSIS — C50919 Malignant neoplasm of unspecified site of unspecified female breast: Secondary | ICD-10-CM | POA: Insufficient documentation

## 2012-12-11 LAB — CBC WITH DIFFERENTIAL/PLATELET
BASO%: 0.3 % (ref 0.0–2.0)
Basophils Absolute: 0 10*3/uL (ref 0.0–0.1)
EOS%: 0.5 % (ref 0.0–7.0)
Eosinophils Absolute: 0 10*3/uL (ref 0.0–0.5)
HCT: 44.2 % (ref 34.8–46.6)
HGB: 14.9 g/dL (ref 11.6–15.9)
LYMPH%: 19.4 % (ref 14.0–49.7)
MCH: 29.6 pg (ref 25.1–34.0)
MCHC: 33.8 g/dL (ref 31.5–36.0)
MCV: 87.6 fL (ref 79.5–101.0)
MONO#: 0.6 10*3/uL (ref 0.1–0.9)
MONO%: 6.4 % (ref 0.0–14.0)
NEUT#: 6.5 10*3/uL (ref 1.5–6.5)
NEUT%: 73.4 % (ref 38.4–76.8)
Platelets: 231 10*3/uL (ref 145–400)
RBC: 5.05 10*6/uL (ref 3.70–5.45)
RDW: 14.5 % (ref 11.2–14.5)
WBC: 8.8 10*3/uL (ref 3.9–10.3)
lymph#: 1.7 10*3/uL (ref 0.9–3.3)

## 2012-12-11 LAB — COMPREHENSIVE METABOLIC PANEL (CC13)
ALT: 26 U/L (ref 0–55)
AST: 17 U/L (ref 5–34)
Albumin: 3.7 g/dL (ref 3.5–5.0)
Alkaline Phosphatase: 65 U/L (ref 40–150)
BUN: 12.7 mg/dL (ref 7.0–26.0)
CO2: 28 mEq/L (ref 22–29)
Calcium: 9.9 mg/dL (ref 8.4–10.4)
Chloride: 106 mEq/L (ref 98–107)
Creatinine: 0.7 mg/dL (ref 0.6–1.1)
Glucose: 176 mg/dl — ABNORMAL HIGH (ref 70–99)
Potassium: 4.3 mEq/L (ref 3.5–5.1)
Sodium: 142 mEq/L (ref 136–145)
Total Bilirubin: 0.56 mg/dL (ref 0.20–1.20)
Total Protein: 6.8 g/dL (ref 6.4–8.3)

## 2012-12-11 NOTE — Patient Instructions (Addendum)
Proceed with lumpectomy  I will see you back in 1 month 

## 2012-12-11 NOTE — Progress Notes (Signed)
Patient ID: Marisa Forbes, female   DOB: 1951/10/30, 62 y.o.   MRN: [id number]  Chief Complaint  Patient presents with  . Other    breast cancer    HPI Marisa Forbes is a 62 y.o. female.  Referred by Dr. Jean Rosenthal HPI 56 yof with no breast complaints who presented for screening mammogram showing possible mass in both breasts.  She underwent further mmg and u/s that showed a suspicious about 1.8 cm mass in outer left breast with no left axillary lad.  The right side was benign.  She also underwent mri that showed a 1.6 cm mass in left breast with no additional findings. 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  This is triple negative and Ki67 is 40%.  She comes in to discuss her options today.  Past Medical History  Diagnosis Date  . HTN (hypertension)   . Diabetes mellitus without complication   . Stroke, embolic   . Myocardial infarction 09/07/12  . Breast cancer     Past Surgical History  Procedure Laterality Date  . Back surgery    . Coronary angioplasty with stent placement      Family History  Problem Relation Age of Onset  . Cancer Neg Hx   . Diabetes Neg Hx   . Hyperlipidemia Neg Hx   . Hypertension Neg Hx   . Kidney disease Neg Hx   . Stroke Neg Hx     Social History History  Substance Use Topics  . Smoking status: Never Smoker   . Smokeless tobacco: Never Used  . Alcohol Use: No     Comment: none    No Known Allergies  Current Outpatient Prescriptions  Medication Sig Dispense Refill  . aspirin EC 81 MG EC tablet Take 1 tablet (81 mg total) by mouth daily.      Marland Kitchen atorvastatin (LIPITOR) 80 MG tablet Take 1 tablet (80 mg total) by mouth daily at 6 PM.  30 tablet  5  . diazepam (VALIUM) 5 MG tablet Take 1 tablet (5 mg total) by mouth every 6 (six) hours as needed for anxiety.  60 tablet  1  . glucose blood (ONETOUCH VERIO) test strip Use TID  100 each  12  . ibuprofen (ADVIL,MOTRIN) 200  MG tablet Take 200 mg by mouth every 6 (six) hours as needed. For pain      . insulin aspart (NOVOLOG) 100 UNIT/ML injection Inject 10 Units into the skin 3 (three) times daily with meals.  1 vial  11  . insulin glargine (LANTUS SOLOSTAR) 100 UNIT/ML injection Inject 31 Units into the skin at bedtime.  9 mL  12  . isosorbide mononitrate (IMDUR) 60 MG 24 hr tablet Take 1 tablet (60 mg total) by mouth daily.  30 tablet  5  . lisinopril (PRINIVIL,ZESTRIL) 10 MG tablet Take 20 mg by mouth daily.      . metFORMIN (GLUCOPHAGE-XR) 750 MG 24 hr tablet Take 1 tablet (750 mg total) by mouth daily with breakfast.  30 tablet  5  . metoprolol (LOPRESSOR) 100 MG tablet Take 1 tablet (100 mg total) by mouth 2 (two) times daily.  60 tablet  5  . nitroGLYCERIN (NITROSTAT) 0.4 MG SL tablet Place 1 tablet (0.4 mg total) under the tongue every 5 (five) minutes x 3 doses as needed for chest pain.  25 tablet  5  . ranolazine (RANEXA) 500 MG 12 hr tablet Take 1 tablet (  500 mg total) by mouth 2 (two) times daily.  60 tablet  5  . Ticagrelor (BRILINTA) 90 MG TABS tablet Take 1 tablet (90 mg total) by mouth 2 (two) times daily.  60 tablet  11   No current facility-administered medications for this visit.    Review of Systems Review of Systems  Constitutional: Negative for fever, chills and unexpected weight change.  HENT: Negative for hearing loss, congestion, sore throat, trouble swallowing and voice change.   Eyes: Negative for visual disturbance.  Respiratory: Negative for cough and wheezing.   Cardiovascular: Negative for chest pain, palpitations and leg swelling.  Gastrointestinal: Negative for nausea, vomiting, abdominal pain, diarrhea, constipation, blood in stool, abdominal distention and anal bleeding.  Genitourinary: Negative for hematuria, vaginal bleeding and difficulty urinating.  Musculoskeletal: Negative for arthralgias.  Skin: Negative for rash and wound.  Neurological: Negative for seizures, syncope  and headaches.  Hematological: Negative for adenopathy. Does not bruise/bleed easily.  Psychiatric/Behavioral: Negative for confusion.    There were no vitals taken for this visit.  Physical Exam Physical Exam  Vitals reviewed. Constitutional: She appears well-developed and well-nourished.  Eyes: No scleral icterus.  Neck: Neck supple.  Cardiovascular: Normal rate, regular rhythm and normal heart sounds.   Pulmonary/Chest: Effort normal and breath sounds normal. She has no wheezes. She has no rales. Right breast exhibits no inverted nipple, no mass, no nipple discharge, no skin change and no tenderness. Left breast exhibits no inverted nipple, no mass, no nipple discharge, no skin change and no tenderness. Breasts are symmetrical.  Lymphadenopathy:    She has no cervical adenopathy.    She has no axillary adenopathy.       Right: No supraclavicular adenopathy present.       Left: No supraclavicular adenopathy present.    Data Reviewed BILATERAL BREAST MRI WITH AND WITHOUT CONTRAST  Technique: Multiplanar, multisequence MR images of both breasts  were obtained prior to and following the intravenous administration  of 12 ml of Multihance. Three dimensional images were evaluated at  the independent DynaCad workstation.  Comparison: 11/12/2012 mammogram and 12/03/2012 breast ultrasound.  Findings: There is minimal bilateral parenchymal background  enhancement. There is an irregular enhancing mass with central  clip artifact located laterally within the left breast (posterior  one third) at the 9 o'clock position (left upper-outer quadrant).  This is associated with a mixture of plateau and wash in/washout  enhancement kinetics. The mass measures 1.6 x 1.5 x 1.5 cm in size  and there is mild adjacent post biopsy change present laterally  located. There are no other worrisome enhancing foci within either  breast. There is no evidence for axillary or internal mammary  adenopathy and  there are no additional findings.  IMPRESSION:  1.6 cm irregular enhancing mass located within the left breast at  the 9 o'clock position corresponding to the recently diagnosed left  breast invasive ductal carcinoma. No additional findings.   Assessment    Clinical stage I left breast cancer     Plan    Cardiology perioperative recs, left breast wire guided lumpectomy/snbx We discussed the staging and pathophysiology of breast cancer. We discussed all of the different options for treatment for breast cancer including surgery, chemotherapy, radiation therapy, Herceptin, and antiestrogen therapy.   We discussed a sentinel lymph node biopsy as she does not appear to having lymph node involvement right now. We discussed the performance of that with injection of radioactive tracer and blue dye. We discussed that she  would have an incision underneath her axillary hairline. We discussed that there is a bout a 10-20% chance of having a positive node with a sentinel lymph node biopsy and we will await the permanent pathology to make any other first further decisions in terms of her treatment. One of these options might be to return to the operating room to perform an axillary lymph node dissection. We discussed about a 1-2% risk lifetime of chronic shoulder pain as well as lymphedema associated with a sentinel lymph node biopsy.  We discussed the options for treatment of the breast cancer which included lumpectomy versus a mastectomy. We discussed the performance of the lumpectomy with a wire placement. We discussed a 10-20% chance of a positive margin requiring reexcision in the operating room. We also discussed that she may need radiation therapy or antiestrogen therapy or both if she undergoes lumpectomy. We discussed the mastectomy and the postoperative care for that as well. We discussed that there is no difference in her survival whether she undergoes lumpectomy with radiation therapy or antiestrogen  therapy versus a mastectomy. There is a slight difference in the local recurrence rate being 3-5% with lumpectomy and about 1% with a mastectomy. We discussed the risks of operation including bleeding, infection, possible reoperation. She understands her further therapy will be based on what her stages at the time of her operation.         Zoiee Wimmer 12/11/2012, 3:26 PM

## 2012-12-11 NOTE — Progress Notes (Signed)
Radiation Oncology         ([phone]) [phone] ________________________________  Initial outpatient Consultation  Name: Marisa Forbes MRN: [id number]  Date: 12/11/2012  DOB: October 10, 1951  [id number] [name], MD  Emelia Loron, MD   REFERRING PHYSICIAN: Emelia Loron, MD  DIAGNOSIS: The encounter diagnosis was Cancer of upper-inner quadrant of female breast.  HISTORY OF PRESENT ILLNESS::Marisa Forbes is a 62 y.o. female who is seen as part of the multidisciplinary breast clinicout of  the courtesy of Dr. Emelia Loron. Last month patient was found to have a suspicious area on screening mammography in the upper outer quadrant of the left breast. She ultimately underwent biopsy of this area which revealed invasive ductal carcinoma.   the tumor was estrogen and progesterone receptor negative as well as HER-2/neu negative MRI revealed a solitary mass measuring 1.6 x 1.5 x 1.5 cm in size. With this information the patient is seen as part of the breast clinic.  PREVIOUS RADIATION THERAPY: No  PAST MEDICAL HISTORY:  has a past medical history of HTN (hypertension); Diabetes mellitus without complication; Stroke, embolic; Myocardial infarction (09/07/12); and Breast cancer.    PAST SURGICAL HISTORY: Past Surgical History  Procedure Laterality Date  . Back surgery    . Coronary angioplasty with stent placement      FAMILY HISTORY: family history is negative for Cancer, and Diabetes, and Hyperlipidemia, and Hypertension, and Kidney disease, and Stroke, .  SOCIAL HISTORY:  reports that she has never smoked. She has never used smokeless tobacco. She reports that she does not drink alcohol or use illicit drugs.  ALLERGIES: Review of patient's allergies indicates no known allergies.  MEDICATIONS:  Current Outpatient Prescriptions  Medication Sig Dispense Refill  . aspirin EC 81 MG EC tablet Take 1 tablet (81 mg total) by mouth daily.      Marland Kitchen atorvastatin (LIPITOR) 80 MG tablet Take 1  tablet (80 mg total) by mouth daily at 6 PM.  30 tablet  5  . diazepam (VALIUM) 5 MG tablet Take 1 tablet (5 mg total) by mouth every 6 (six) hours as needed for anxiety.  60 tablet  1  . glucose blood (ONETOUCH VERIO) test strip Use TID  100 each  12  . ibuprofen (ADVIL,MOTRIN) 200 MG tablet Take 200 mg by mouth every 6 (six) hours as needed. For pain      . insulin aspart (NOVOLOG) 100 UNIT/ML injection Inject 10 Units into the skin 3 (three) times daily with meals.  1 vial  11  . insulin glargine (LANTUS SOLOSTAR) 100 UNIT/ML injection Inject 31 Units into the skin at bedtime.  9 mL  12  . isosorbide mononitrate (IMDUR) 60 MG 24 hr tablet Take 1 tablet (60 mg total) by mouth daily.  30 tablet  5  . lisinopril (PRINIVIL,ZESTRIL) 10 MG tablet Take 20 mg by mouth daily.      . metFORMIN (GLUCOPHAGE-XR) 750 MG 24 hr tablet Take 1 tablet (750 mg total) by mouth daily with breakfast.  30 tablet  5  . metoprolol (LOPRESSOR) 100 MG tablet Take 1 tablet (100 mg total) by mouth 2 (two) times daily.  60 tablet  5  . nitroGLYCERIN (NITROSTAT) 0.4 MG SL tablet Place 1 tablet (0.4 mg total) under the tongue every 5 (five) minutes x 3 doses as needed for chest pain.  25 tablet  5  . ranolazine (RANEXA) 500 MG 12 hr tablet Take 1 tablet (500 mg total) by mouth 2 (two) times daily.  60 tablet  5  . Ticagrelor (BRILINTA) 90 MG TABS tablet Take 1 tablet (90 mg total) by mouth 2 (two) times daily.  60 tablet  11   No current facility-administered medications for this encounter.    REVIEW OF SYSTEMS:  A 15 point review of systems is documented in the electronic medical record. This was obtained by the nursing staff. However, I reviewed this with the patient to discuss relevant findings and make appropriate changes.  Patient denies any pain in the breast area nipple discharge or bleeding prior to biopsy. After the biopsies she has some discomfort in the upper-outer aspect of the left breast.   PHYSICAL EXAM: This is  a very pleasant 62 year old female in no acute distress. She is accompanied by her husband and daughter on evaluation today. She does have a resting tremor present. Examination of the neck and supraclavicular region reveals no evidence of adenopathy. The axillary areas are free of adenopathy. Examination of the lungs reveals them to be clear. The heart has a regular rhythm and rate.  Examination of the right breast reveals it to be pendulous without mass or nipple discharge. Examination of the left breast reveals some bruising in the upper-outer aspect of the breast. There is no dominant mass appreciated in the breast nipple discharge or bleeding.   LABORATORY DATA:  Lab Results  Component Value Date   WBC 8.8 12/11/2012   HGB 14.9 12/11/2012   HCT 44.2 12/11/2012   MCV 87.6 12/11/2012   PLT 231 12/11/2012   Lab Results  Component Value Date   NA 142 12/11/2012   K 4.3 12/11/2012   CL 106 12/11/2012   CO2 28 12/11/2012   Lab Results  Component Value Date   ALT 26 12/11/2012   AST 17 12/11/2012   ALKPHOS 65 12/11/2012   BILITOT 0.56 12/11/2012     RADIOGRAPHY: US Breast Bilateral  11/26/2012  *RADIOLOGY REPORT*  Clinical Data:  Called back from screening mammography, possible bilateral breast masses.  DIGITAL DIAGNOSTIC BILATERAL MAMMOGRAM WITH CAD AND BILATERAL BREAST ULTRASOUND:  Comparison:  Mammography 11/12/2012, 04/03/2007.  No prior ultrasound.  Findings:  ACR Breast Density Category 2: There is a scattered fibroglandular pattern.  Spot compression CC view and a true lateral view of the right breast were obtained.  These confirm an oval shaped circumscribed density in the far inner right breast, the edge of which is just barely imaged on the true lateral view.  According to the mammographer, there are prominent superficial veins in this region, and this finding could certainly represent a vein.  Several benign calcified oil cysts are present in the upper inner right breast.  Spot compression CC  and MLO views of the area of concern in the outer left breast, middle to posterior third, were obtained. These confirm a vague mass without associated architectural distortion or suspicious calcifications.  The true lateral view of the right breast was processed with CAD.  On physical exam, there is a firm approximate 2 cm mass in the outer left breast.  No mass is palpated in the inner right breast.  Ultrasound is performed, showing an irregular hypoechoic mass at the 9 o'clock position of the left breast, approximately 12 cm from the nipple, measuring approximately 1.8 x 1.0 x 1.2 cm.  There is no associated acoustic shadowing.  Sonographic evaluation of the left axilla showed no lymphadenopathy.  Adjacent calcified oil cysts are identified in the right o'clock position of the right breast, eight and 12  cm from the nipple, corresponding to the mammographic abnormality.  In the far inner left breast there is a superficial vein which may account for the mammographic abnormality.  No suspicious solid mass or abnormal acoustic shadowing was identified in the inner right breast.  IMPRESSION:  1.  Suspicious approximate 1.8 cm mass in the outer left breast. No left axillary lymphadenopathy. 2.  No mammographic or sonographic evidence of malignancy, right breast.  Benign calcified oil cysts in the upper outer right breast.  Superficial vein in the far inner right breast likely accounts for the area of concern on screening mammography.  RECOMMENDATION: Ultrasound-guided core needle biopsy of the suspicious mass in the outer left breast was discussed with the patient.  She agrees to proceed and this has been scheduled for Tuesday, February 4, at 4 o'clock p.m.  I have discussed the findings and recommendations with the patient. Results were also provided in writing at the conclusion of the visit.  BI-RADS CATEGORY 4:  Suspicious abnormality - biopsy should be considered.   Original Report Authenticated By: Hulan Saas,  M.D.    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  Preoperative evaluation.  BILATERAL BREAST MRI WITH AND WITHOUT CONTRAST  Technique: Multiplanar, multisequence MR images of both breasts were obtained prior to and following the intravenous administration of 12 ml of Multihance.  Three dimensional images were evaluated at the independent DynaCad workstation.  Comparison:  11/12/2012 mammogram and 12/03/2012 breast ultrasound.  Findings: There is minimal bilateral parenchymal background enhancement.  There is an irregular enhancing mass with central clip artifact located laterally within the left breast (posterior one third) at the 9 o'clock position (left upper-outer quadrant). This is associated with a mixture of plateau and wash in/washout enhancement kinetics.  The mass measures 1.6 x 1.5 x 1.5 cm in size and there is mild adjacent post biopsy change present laterally located.  There are no other worrisome enhancing foci within either breast.  There is no evidence for axillary or internal mammary adenopathy and there are no additional findings.  IMPRESSION: 1.6 cm irregular enhancing mass located within the left breast at the 9 o'clock position corresponding to the recently diagnosed left breast invasive ductal carcinoma.  No additional findings.  RECOMMENDATION: Treatment plan  THREE-DIMENSIONAL MR IMAGE RENDERING ON INDEPENDENT WORKSTATION:  Three-dimensional MR images were rendered by post-processing of the original MR data on an independent workstation.  The three- dimensional MR images were interpreted, and findings were reported in the accompanying complete MRI report for this study.   BI-RADS CATEGORY 6:  Known biopsy-proven malignancy - appropriate action should be taken.   Original Report Authenticated By: Rolla Plate, M.D.    [facility]  11/26/2012   *RADIOLOGY REPORT*  Clinical Data:  Called back from screening mammography, possible bilateral breast masses.  DIGITAL DIAGNOSTIC BILATERAL MAMMOGRAM WITH CAD AND BILATERAL BREAST ULTRASOUND:  Comparison:  Mammography 11/12/2012, 04/03/2007.  No prior ultrasound.  Findings:  ACR Breast Density Category 2: There is a scattered fibroglandular pattern.  Spot compression CC view and a true lateral view of the right breast were obtained.  These confirm an oval shaped circumscribed density in the far inner right breast, the edge of which is just barely imaged on the true lateral view.  According to the mammographer, there are prominent superficial veins in this region, and this finding could certainly represent a vein.  Several benign calcified oil cysts are present in the upper inner right breast.  Spot compression CC and MLO views of the area of concern in the outer left breast, middle to posterior third, were obtained. These confirm a vague mass without associated architectural distortion or suspicious calcifications.  The true lateral view of the right breast was processed with CAD.  On physical exam, there is a firm approximate 2 cm mass in the outer left breast.  No mass is palpated in the inner right breast.  Ultrasound is performed, showing an irregular hypoechoic mass at the 9 o'clock position of the left breast, approximately 12 cm from the nipple, measuring approximately 1.8 x 1.0 x 1.2 cm.  There is no associated acoustic shadowing.  Sonographic evaluation of the left axilla showed no lymphadenopathy.  Adjacent calcified oil cysts are identified in the right o'clock position of the right breast, eight and 12 cm from the nipple, corresponding to the mammographic abnormality.  In the far inner left breast there is a superficial vein which may account for the mammographic abnormality.  No suspicious solid mass or abnormal acoustic shadowing was identified in the inner right breast.  IMPRESSION:  1.  Suspicious  approximate 1.8 cm mass in the outer left breast. No left axillary lymphadenopathy. 2.  No mammographic or sonographic evidence of malignancy, right breast.  Benign calcified oil cysts in the upper outer right breast.  Superficial vein in the far inner right breast likely accounts for the area of concern on screening mammography.  RECOMMENDATION: Ultrasound-guided core needle biopsy of the suspicious mass in the outer left breast was discussed with the patient.  She agrees to proceed and this has been scheduled for Tuesday, February 4, at 4 o'clock p.m.  I have discussed the findings and recommendations with the patient. Results were also provided in writing at the conclusion of the visit.  BI-RADS CATEGORY 4:  Suspicious abnormality - biopsy should be considered.   Original Report Authenticated By: Hulan Saas, M.D.    [facility]  12/03/2012  *RADIOLOGY REPORT*  Clinical Data:  Post left breast ultrasound guided core biopsy.  DIGITAL DIAGNOSTIC LEFT BREAST MAMMOGRAM  Comparison:  Previous exams.  Findings:  Films are performed following ultrasound guided biopsy of the mass located laterally within the left breast at the 9 o'clock position.  The ribbon shaped clip is in appropriate position.  IMPRESSION: Appropriate positioning of clip following left breast ultrasound guided core biopsy.   Original Report Authenticated By: Rolla Plate, M.D.    [facility] Scre[facility]  11/13/2012  *RADIOLOGY REPORT*  Clinical Data: Screening.  DIGITAL BILATERAL SCREENING MAMMOGRAM WITH CAD  Comparison:   Previous exams.  FINDINGS:  ACR Breast Density Category 2: There is a scattered fibroglandular pattern.  In both breasts, a possible mass warrants further imaging evaluation with spot compression views and possibly ultrasound.  Images were processed with CAD.  IMPRESSION:  Further imaging evaluation is suggested for a possible mass in each breast.  RECOMMENDATION: Diagnostic mammogram and possibly  ultrasoundof both breasts. (Code:FI-B-2M)  The patient will be contacted regarding the findings, and additional imaging will be scheduled.  BI-RADS CATEGORY 0:  Incomplete.  Need additional imaging evaluation and/or prior mammograms for comparison.   Original Report Authenticated By: Harmon Pier, M.D.    [facility]  12/05/2012  *RADIOLOGY REPORT*  ESTABLISHED PATIENT OFFICE VISIT - LEVEL II [phone])  Chief Complaint:  The patient presents with her husband to discuss pathology  results after biopsy.  History:  The patient underwent biopsy for a left breast mass in the 9 o'clock location after evaluation for a questioned palpable finding.  Exam:  At physical exam, there is mild ecchymosis at the biopsy site.  The bandage is clean and dry.  Pathology: Pathology demonstrates invasive ductal carcinoma with high-grade DCIS, which is concordant with the imaging appearance.  Assessment and Plan:  Multidisciplinary cancer clinic appointment has been arranged 12/01/12.  Breast MRI has been arranged 12/09/2012 at 8:30 a.m.  The patient reports no problems at the biopsy site. All questions were answered.   Original Report Authenticated By: Christiana Pellant, M.D.    [place] Lt Breast Bx W Loc Dev 1st Lesion Img Bx Spec US Guide  12/03/2012  *RADIOLOGY REPORT*  Clinical Data:  Left breast mass.  ULTRASOUND GUIDED CORE BIOPSY OF THE left BREAST  Comparison: Previous exams.  I met with the patient and we discussed the procedure of ultrasound- guided biopsy, including benefits and alternatives.  We discussed the high likelihood of a successful procedure. We discussed the risks of the procedure, including infection, bleeding, tissue injury, clip migration, and inadequate sampling.  Informed written consent was given.  Using sterile technique 2% lidocaine, ultrasound guidance and a 14 gauge automated biopsy device, biopsy was performed of the mass located laterally within the left breast at the 9 o'clock position using a  lateral approach.  At the conclusion of the procedure a ribbon shaped tissue marker clip was deployed into the biopsy cavity.  Follow up 2 view mammogram was performed and dictated separately.  IMPRESSION: Ultrasound guided biopsy of the left breast mass located at the 9 o'clock position as discussed above.  No apparent complications.   Original Report Authenticated By: Rolla Plate, M.D.       IMPRESSION: Clinical stage I "triple negative" invasive ductal carcinoma of the left breast. Patient would be a good candidate for breast conserving therapy with partial mastectomy and sentinel node procedure. Depending on the extent of invasive tumor present in the lumpectomy specimen she may be a candidate for adjuvant chemotherapy per Dr. Milta Deiters evaluation.  She will proceed with surgery under the direction of Dr. Dwain Sarna. Final treatment recommendations will depend on results of her surgery.                                                                                                                                   ------------------------------------------------  Billie Lade, PhD, MD

## 2012-12-12 ENCOUNTER — Telehealth: Payer: Self-pay | Admitting: Oncology

## 2012-12-12 LAB — CANCER ANTIGEN 27.29: CA 27.29: 22 U/mL (ref 0–39)

## 2012-12-12 NOTE — Telephone Encounter (Signed)
lmonvm for pt re appt for 3/11. Mailed schedule.

## 2012-12-16 ENCOUNTER — Telehealth: Payer: Self-pay | Admitting: Internal Medicine

## 2012-12-16 ENCOUNTER — Encounter (INDEPENDENT_AMBULATORY_CARE_PROVIDER_SITE_OTHER): Payer: Self-pay

## 2012-12-16 DIAGNOSIS — I2510 Atherosclerotic heart disease of native coronary artery without angina pectoris: Secondary | ICD-10-CM

## 2012-12-16 DIAGNOSIS — I472 Ventricular tachycardia: Secondary | ICD-10-CM

## 2012-12-16 NOTE — Telephone Encounter (Signed)
PT wants to switch cardiologists and be referred to one in [place].  Aware that Dr. [name] is out until next week.  Needs an appt by April.

## 2012-12-18 ENCOUNTER — Other Ambulatory Visit (INDEPENDENT_AMBULATORY_CARE_PROVIDER_SITE_OTHER): Payer: Self-pay | Admitting: General Surgery

## 2012-12-18 ENCOUNTER — Telehealth (INDEPENDENT_AMBULATORY_CARE_PROVIDER_SITE_OTHER): Payer: Self-pay

## 2012-12-18 DIAGNOSIS — C50219 Malignant neoplasm of upper-inner quadrant of unspecified female breast: Secondary | ICD-10-CM

## 2012-12-18 NOTE — Telephone Encounter (Signed)
Called pt to let her know that Dr Dwain Sarna has spoke with Dr Tresa Endo about the cardiac clearance. I advised pt that Dr Landry Dyke office is going to be calling her to set up an appt for her to come in the next week. The pt has her appt already with Dr Tresa Endo for 12/23/12. I advised pt that Dr Dwain Sarna will go ahead and work on getting pt scheduled for surgery for the first week in March. I advised pt that she will be admitted to the hospital 2 days before her surgery date to get some IV medication for the antiplaletes b/c of her medication that she takes Dr Tresa Endo said she could not stop it for 71yr from the heart attack she had in November 2013. I advised pt that we would work on getting her scheduled and call her back.

## 2012-12-20 ENCOUNTER — Telehealth: Payer: Self-pay | Admitting: *Deleted

## 2012-12-20 NOTE — Telephone Encounter (Signed)
Left vm for pt to return call regarding [facility] from 12/11/12. 

## 2012-12-23 NOTE — Telephone Encounter (Signed)
done

## 2012-12-23 NOTE — Telephone Encounter (Signed)
Burna Mortimer called from Dr Landry Dyke office and wanted to know the patient's date of surgery.  I told her March 12th.  She said she may need to get admitted the day before due to the blood thinners but they will work it out.

## 2012-12-24 ENCOUNTER — Encounter: Payer: Self-pay | Admitting: *Deleted

## 2012-12-24 NOTE — Telephone Encounter (Signed)
LMOM for pt to call me b/c I need to ask her a question about being admitted to the hospital before her surgery date and give her sx date info.

## 2012-12-24 NOTE — Telephone Encounter (Signed)
Pt is aware [facility] will call when the appt is set up.

## 2012-12-25 ENCOUNTER — Telehealth (INDEPENDENT_AMBULATORY_CARE_PROVIDER_SITE_OTHER): Payer: Self-pay

## 2012-12-25 ENCOUNTER — Telehealth: Payer: Self-pay | Admitting: Oncology

## 2012-12-25 NOTE — Telephone Encounter (Signed)
s.w. pt and advised on d/t change of apt...pt ok and aware

## 2012-12-25 NOTE — Telephone Encounter (Signed)
Called Marisa Forbes and we spoke about the surgery date. I asked the Marisa Forbes about her being ok with her husband taking her to the [facility] the day of surgery to get the needle loc placed and her husband drive her back to the hospital at [facility]. The Marisa Forbes is fine with this plan. I advised Marisa Forbes that we would be back in touch with her to give her more details about being admitted two days before her surgery date on 01/08/13. I told her that Dr Dwain Sarna would have to be the one to admit her two days before surgery but Dr Landry Dyke office would be the one to take of the anticoagulation part. The Marisa Forbes understands.

## 2012-12-26 NOTE — Progress Notes (Signed)
Marisa Forbes [id number] 08/02/1951 62 y.o. 12/26/2012 11:48 PM  CC  [name], MD [address] [facility] [address] [place], [address] [place] [address] Dr. Emelia Loron Dr. 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 Patient was seen in the Multidisciplinary Breast Clinic for discussion of her treatment options.  STAGE:   Cancer of upper-inner quadrant of female breast   Primary site: Breast (Left)   Staging method: AJCC 7th Edition   Clinical: Stage IA (T1c, N0, cM0)   Summary: Stage IA (T1c, N0, cM0)  REFERRING PHYSICIAN: Dr. Emelia Loron  HISTORY OF PRESENT ILLNESS:  Marisa Forbes is a 62 y.o. female.  Who Last month patient was found to have a suspicious area on screening mammography in the upper outer quadrant of the left breast. She ultimately underwent biopsy of this area which revealed invasive ductal carcinoma. the tumor was estrogen and progesterone receptor negative as well as HER-2/neu negative MRI revealed a solitary mass measuring 1.6 x 1.5 x 1.5 cm in size.visions case was discussed at the multidisciplinary breast conference. Her pathology and radiology were discussed. She is without any complaints.   Past Medical History: Past Medical History  Diagnosis Date  . HTN (hypertension)   . Diabetes mellitus without complication   . Stroke, embolic   . Myocardial infarction 09/07/12  . Breast cancer     Past Surgical History: Past Surgical History  Procedure Laterality Date  . Back surgery    . Coronary angioplasty with stent placement      Family History: Family History  Problem Relation Age of Onset  . Cancer Neg Hx   . Diabetes Neg Hx   . Hyperlipidemia Neg Hx   . Hypertension Neg Hx   . Kidney disease Neg Hx   . Stroke Neg Hx     Social History History  Substance Use Topics  . Smoking status: Never  Smoker   . Smokeless tobacco: Never Used  . Alcohol Use: No     Comment: none    Allergies: No Known Allergies  Current Medications: Current Outpatient Prescriptions  Medication Sig Dispense Refill  . aspirin EC 81 MG EC tablet Take 1 tablet (81 mg total) by mouth daily.      Marland Kitchen atorvastatin (LIPITOR) 80 MG tablet Take 1 tablet (80 mg total) by mouth daily at 6 PM.  30 tablet  5  . diazepam (VALIUM) 5 MG tablet Take 1 tablet (5 mg total) by mouth every 6 (six) hours as needed for anxiety.  60 tablet  1  . glucose blood (ONETOUCH VERIO) test strip Use TID  100 each  12  . ibuprofen (ADVIL,MOTRIN) 200 MG tablet Take 200 mg by mouth every 6 (six) hours as needed. For pain      . insulin aspart (NOVOLOG) 100 UNIT/ML injection Inject 10 Units into the skin 3 (three) times daily with meals.  1 vial  11  . insulin glargine (LANTUS SOLOSTAR) 100 UNIT/ML injection Inject 31 Units into the skin at bedtime.  9 mL  12  . isosorbide mononitrate (IMDUR) 60 MG 24 hr tablet Take 1 tablet (60 mg total) by mouth daily.  30 tablet  5  . lisinopril (PRINIVIL,ZESTRIL) 10 MG tablet Take 20 mg by mouth daily.      . metFORMIN (GLUCOPHAGE-XR) 750 MG 24 hr tablet Take 1 tablet (750 mg total)  by mouth daily with breakfast.  30 tablet  5  . metoprolol (LOPRESSOR) 100 MG tablet Take 1 tablet (100 mg total) by mouth 2 (two) times daily.  60 tablet  5  . ranolazine (RANEXA) 500 MG 12 hr tablet Take 1 tablet (500 mg total) by mouth 2 (two) times daily.  60 tablet  5  . Ticagrelor (BRILINTA) 90 MG TABS tablet Take 1 tablet (90 mg total) by mouth 2 (two) times daily.  60 tablet  11  . nitroGLYCERIN (NITROSTAT) 0.4 MG SL tablet Place 1 tablet (0.4 mg total) under the tongue every 5 (five) minutes x 3 doses as needed for chest pain.  25 tablet  5   No current facility-administered medications for this visit.    OB/GYN History:menarche at age 70 she is postmenopausal she has never used hormone replacement therapy. She has  carried to term births first live birth was at 15.  Fertility Discussion:not applicable Prior History of Cancer:no prior history of malignancy  Health Maintenance:  Colonoscopyno Bone Densityno Last PAP smear2011  ECOG PERFORMANCE STATUS: 0 - Asymptomatic  Genetic Counseling/testing:deferred  REVIEW OF SYSTEMS:  A comprehensive review of systems was negative.  PHYSICAL EXAMINATION: Blood pressure 163/93, pulse 71, temperature 98.7 F (37.1 C), temperature source Oral, resp. rate 20, height 5\' 5"  (1.651 m), weight 146 lb (66.225 kg).  ZOX:WRUEA, healthy, no distress, well nourished and well developed SKIN: skin color, texture, turgor are normal HEAD: Normocephalic EYES: PERRLA, EOMI EARS: External ears normal OROPHARYNX:no exudate and no erythema  NECK: supple, no adenopathy LYMPH:  no palpable lymphadenopathy, no hepatosplenomegaly BREAST:right breast normal without mass, skin or nipple changes or axillary nodes, abnormal mass palpable in the left breast likely hematoma no nipple discharge. LUNGS: clear to auscultation  HEART: regular rate & rhythm ABDOMEN:abdomen soft, non-tender, normal bowel sounds and no masses or organomegaly BACK: No CVA tenderness EXTREMITIES:no edema, no clubbing, no cyanosis  NEURO: alert & oriented x 3 with fluent speech, no focal motor/sensory deficits, gait normal     STUDIES/RESULTS: Mr Breast Bilateral W Wo Contrast  12/09/2012  *RADIOLOGY REPORT*  Clinical Data: Recently diagnosed left breast invasive ductal carcinoma with high-grade DCIS.  Preoperative evaluation.  BILATERAL BREAST MRI WITH AND WITHOUT CONTRAST  Technique: Multiplanar, multisequence MR images of both breasts were obtained prior to and following the intravenous administration of 12 ml of Multihance.  Three dimensional images were evaluated at the independent DynaCad workstation.  Comparison:  11/12/2012 mammogram and 12/03/2012 breast ultrasound.  Findings: There is minimal  bilateral parenchymal background enhancement.  There is an irregular enhancing mass with central clip artifact located laterally within the left breast (posterior one third) at the 9 o'clock position (left upper-outer quadrant). This is associated with a mixture of plateau and wash in/washout enhancement kinetics.  The mass measures 1.6 x 1.5 x 1.5 cm in size and there is mild adjacent post biopsy change present laterally located.  There are no other worrisome enhancing foci within either breast.  There is no evidence for axillary or internal mammary adenopathy and there are no additional findings.  IMPRESSION: 1.6 cm irregular enhancing mass located within the left breast at the 9 o'clock position corresponding to the recently diagnosed left breast invasive ductal carcinoma.  No additional findings.  RECOMMENDATION: Treatment plan  THREE-DIMENSIONAL MR IMAGE RENDERING ON INDEPENDENT WORKSTATION:  Three-dimensional MR images were rendered by post-processing of the original MR data on an independent workstation.  The three- dimensional MR images were interpreted, and  findings were reported in the accompanying complete MRI report for this study.   BI-RADS CATEGORY 6:  Known biopsy-proven malignancy - appropriate action should be taken.   Original Report Authenticated By: Rolla Plate, M.D.    Mm Digital Diagnostic Unilat L  12/03/2012  *RADIOLOGY REPORT*  Clinical Data:  Post left breast ultrasound guided core biopsy.  DIGITAL DIAGNOSTIC LEFT BREAST MAMMOGRAM  Comparison:  Previous exams.  Findings:  Films are performed following ultrasound guided biopsy of the mass located laterally within the left breast at the 9 o'clock position.  The ribbon shaped clip is in appropriate position.  IMPRESSION: Appropriate positioning of clip following left breast ultrasound guided core biopsy.   Original Report Authenticated By: Rolla Plate, M.D.    Mm Radiologist Eval And Mgmt  12/05/2012  *RADIOLOGY REPORT*   ESTABLISHED PATIENT OFFICE VISIT - LEVEL II [phone])  Chief Complaint:  The patient presents with her husband to discuss pathology results after biopsy.  History:  The patient underwent biopsy for a left breast mass in the 9 o'clock location after evaluation for a questioned palpable finding.  Exam:  At physical exam, there is mild ecchymosis at the biopsy site.  The bandage is clean and dry.  Pathology: Pathology demonstrates invasive ductal carcinoma with high-grade DCIS, which is concordant with the imaging appearance.  Assessment and Plan:  Multidisciplinary cancer clinic appointment has been arranged 12/01/12.  Breast MRI has been arranged 12/09/2012 at 8:30 a.m.  The patient reports no problems at the biopsy site. All questions were answered.   Original Report Authenticated By: Christiana Pellant, M.D.    [place] Lt Breast Bx W Loc Dev 1st Lesion Img Bx Spec US Guide  12/03/2012  *RADIOLOGY REPORT*  Clinical Data:  Left breast mass.  ULTRASOUND GUIDED CORE BIOPSY OF THE left BREAST  Comparison: Previous exams.  I met with the patient and we discussed the procedure of ultrasound- guided biopsy, including benefits and alternatives.  We discussed the high likelihood of a successful procedure. We discussed the risks of the procedure, including infection, bleeding, tissue injury, clip migration, and inadequate sampling.  Informed written consent was given.  Using sterile technique 2% lidocaine, ultrasound guidance and a 14 gauge automated biopsy device, biopsy was performed of the mass located laterally within the left breast at the 9 o'clock position using a lateral approach.  At the conclusion of the procedure a ribbon shaped tissue marker clip was deployed into the biopsy cavity.  Follow up 2 view mammogram was performed and dictated separately.  IMPRESSION: Ultrasound guided biopsy of the left breast mass located at the 9 o'clock position as discussed above.  No apparent complications.   Original Report Authenticated By:  Rolla Plate, M.D.      LABS:    Chemistry      Component Value Date/Time   NA 142 12/11/2012 1229   NA 135 09/13/2012 0505   K 4.3 12/11/2012 1229   K 3.7 09/13/2012 0505   CL 106 12/11/2012 1229   CL 101 09/13/2012 0505   CO2 28 12/11/2012 1229   CO2 26 09/13/2012 0505   BUN 12.7 12/11/2012 1229   BUN 9 09/13/2012 0505   CREATININE 0.7 12/11/2012 1229   CREATININE 0.44* 09/13/2012 0505      Component Value Date/Time   CALCIUM 9.9 12/11/2012 1229   CALCIUM 9.1 09/13/2012 0505   ALKPHOS 65 12/11/2012 1229   AST 17 12/11/2012 1229   ALT 26 12/11/2012 1229   BILITOT 0.56 12/11/2012 1229  Lab Results  Component Value Date   WBC 8.8 12/11/2012   HGB 14.9 12/11/2012   HCT 44.2 12/11/2012   MCV 87.6 12/11/2012   PLT 231 12/11/2012   PATHOLOGY: ADDITIONAL INFORMATION: PROGNOSTIC INDICATORS - ACIS Results IMMUNOHISTOCHEMICAL AND MORPHOMETRIC ANALYSIS BY THE AUTOMATED CELLULAR IMAGING SYSTEM (ACIS) Estrogen Receptor (Negative, <1%): 0%, NEGATIVE Progesterone Receptor (Negative, <1%): 0%, NEGATIVE Proliferation Marker Ki67 by M IB-1 (Low<20%): 54% COMMENT: The negative hormone receptor study(ies) in this case have an internal positive control. All controls stained appropriately Pecola Leisure MD Pathologist, Electronic Signature ( Signed 12/11/2012) CHROMOGENIC IN-SITU HYBRIDIZATION Interpretation HER-2/NEU BY CISH - NO AMPLIFICATION OF HER-2 DETECTED. THE RATIO OF HER-2: CEP 17 SIGNALS WAS 1.44. Reference range: Ratio: HER2:CEP17 < 1.8 - gene amplification not observed Ratio: HER2:CEP 17 1.8-2.2 - equivocal result Ratio: HER2:CEP17 > 2.2 - gene amplification observed Pecola Leisure MD Pathologist, Electronic Signature ( Signed 12/11/2012) 1 of 3 FINAL for Forse, Cathryn N ([id number]) FINAL DIAGNOSIS Diagnosis Breast, left, needle core biopsy, 9 o'clock - INVASIVE DUCTAL CARCINOMA. - ASSOCIATED HIGH GRADE DUCTAL CARCINOMA IN SITU WITH NECROSIS. - SEE  COMMENT. Microscopic Comment Immunohistochemical stains are performed to confirm the presence of invasive ductal carcinoma. An E-cadherin stain is positive in the invasive tumor. The tumor shows loss of p63. Smooth muscle myosin and calponin show staining in the surrounding stroma but are lost in the invasive tumor nests. The immunohistochemical stains coupled with the morphology is consistent with the above diagnosis although definitive grading of breast carcinoma is best done on excision, the features of the tumor from the left 9 o'clock breast biopsy are compatible with a grade II to III breast carcinoma. Breast prognostic markers will be performed and reported in an addendum. The findings are called to [facility] on 12/05/12. Dr. Dierdre Searles has seen this case in consultation with agreement. (RAH:gt, 12/05/12) Zandra Abts MD Pathologist, Electronic Signature (Case signed 12/05/2012)  ASSESSMENT    62 year old female with  #1 new diagnosis of invasive ductal carcinoma of the left breast at the 9:00 position. Patient is status post needle core biopsy that showed invasive ductal carcinoma grade 2-3 ER negative PR negative HER-2/neu negative with a Ki-67 54%. Patient is seen in the multidisciplinary clinic for discussion of treatment options. She is clinical stage I.  #2 patient is a good candidate for breast conservation surgery so therefore she will get a lumpectomy with a sentinel lymph node biopsy followed by radiation.  #3 because patient's tumor is triple negative she would most likely received chemotherapy. But we will make a final decision on systemic treatment based on the final pathology at the time of her lumpectomy.  Clinical Trial Eligibility:no Multidisciplinary conference discussionyes     PLAN:    #1 patient will proceed with lumpectomy with sentinel lymph node biopsy with Dr. Emelia Loron.  #2 she will receive adjuvant chemotherapy most likely since her  tumor is triple negative but I would like to see the final size of the tumor and other tumor characteristics. We will hold getting a port on her up front. The final decision about her port will be made later.  #3 patient will need radiation therapy post lumpectomy.        Discussion: Patient is being treated per NCCN breast cancer care guidelines appropriate for stage.stage I   Thank you so much for allowing me to participate in the care of [name]. I will continue to follow up the patient with you and  assist in her care.  All questions were answered. The patient knows to call the clinic with any problems, questions or concerns. We can certainly see the patient much sooner if necessary.  I spent 60 minutes counseling the patient face to face. The total time spent in the appointment was 60 minutes.  Drue Second, MD Medical/Oncology [facility] [phone] (beeper) [phone] (Office)

## 2012-12-27 NOTE — Telephone Encounter (Signed)
error 

## 2012-12-30 ENCOUNTER — Ambulatory Visit: Payer: BC Managed Care – PPO | Admitting: Internal Medicine

## 2012-12-31 ENCOUNTER — Encounter (HOSPITAL_COMMUNITY): Payer: Self-pay | Admitting: Respiratory Therapy

## 2013-01-06 ENCOUNTER — Inpatient Hospital Stay (HOSPITAL_COMMUNITY)
Admission: RE | Admit: 2013-01-06 | Discharge: 2013-01-09 | DRG: 260 | Disposition: A | Discharge status: Home / Self Care | Payer: BC Managed Care – PPO | Source: Ambulatory Visit | Attending: General Surgery | Admitting: General Surgery

## 2013-01-06 ENCOUNTER — Encounter (HOSPITAL_COMMUNITY): Payer: Self-pay | Admitting: *Deleted

## 2013-01-06 DIAGNOSIS — I739 Peripheral vascular disease, unspecified: Secondary | ICD-10-CM | POA: Diagnosis present

## 2013-01-06 DIAGNOSIS — Z7982 Long term (current) use of aspirin: Secondary | ICD-10-CM

## 2013-01-06 DIAGNOSIS — I252 Old myocardial infarction: Secondary | ICD-10-CM

## 2013-01-06 DIAGNOSIS — E785 Hyperlipidemia, unspecified: Secondary | ICD-10-CM | POA: Diagnosis present

## 2013-01-06 DIAGNOSIS — C50219 Malignant neoplasm of upper-inner quadrant of unspecified female breast: Principal | ICD-10-CM | POA: Diagnosis present

## 2013-01-06 DIAGNOSIS — I10 Essential (primary) hypertension: Secondary | ICD-10-CM | POA: Diagnosis present

## 2013-01-06 DIAGNOSIS — Z794 Long term (current) use of insulin: Secondary | ICD-10-CM

## 2013-01-06 DIAGNOSIS — I110 Hypertensive heart disease with heart failure: Secondary | ICD-10-CM | POA: Diagnosis present

## 2013-01-06 DIAGNOSIS — I2510 Atherosclerotic heart disease of native coronary artery without angina pectoris: Secondary | ICD-10-CM | POA: Diagnosis present

## 2013-01-06 DIAGNOSIS — Z8673 Personal history of transient ischemic attack (TIA), and cerebral infarction without residual deficits: Secondary | ICD-10-CM

## 2013-01-06 DIAGNOSIS — E119 Type 2 diabetes mellitus without complications: Secondary | ICD-10-CM | POA: Diagnosis present

## 2013-01-06 DIAGNOSIS — Z79899 Other long term (current) drug therapy: Secondary | ICD-10-CM

## 2013-01-06 DIAGNOSIS — Z9861 Coronary angioplasty status: Secondary | ICD-10-CM

## 2013-01-06 LAB — CBC WITH DIFFERENTIAL/PLATELET
Basophils Absolute: 0 10*3/uL (ref 0.0–0.1)
Basophils Absolute: 0 10*3/uL (ref 0.0–0.1)
Basophils Relative: 0 % (ref 0–1)
Basophils Relative: 0 % (ref 0–1)
Eosinophils Absolute: 0.1 10*3/uL (ref 0.0–0.7)
Eosinophils Absolute: 0.2 10*3/uL (ref 0.0–0.7)
Eosinophils Relative: 2 % (ref 0–5)
Eosinophils Relative: 3 % (ref 0–5)
HCT: 38.8 % (ref 36.0–46.0)
HCT: 39.6 % (ref 36.0–46.0)
Hemoglobin: 13.5 g/dL (ref 12.0–15.0)
Hemoglobin: 13.8 g/dL (ref 12.0–15.0)
Lymphocytes Relative: 39 % (ref 12–46)
Lymphocytes Relative: 46 % (ref 12–46)
Lymphs Abs: 2.6 10*3/uL (ref 0.7–4.0)
Lymphs Abs: 2.8 10*3/uL (ref 0.7–4.0)
MCH: 29.9 pg (ref 26.0–34.0)
MCH: 30 pg (ref 26.0–34.0)
MCHC: 34.8 g/dL (ref 30.0–36.0)
MCHC: 34.8 g/dL (ref 30.0–36.0)
MCV: 85.8 fL (ref 78.0–100.0)
MCV: 86.1 fL (ref 78.0–100.0)
Monocytes Absolute: 0.4 10*3/uL (ref 0.1–1.0)
Monocytes Absolute: 0.4 10*3/uL (ref 0.1–1.0)
Monocytes Relative: 7 % (ref 3–12)
Monocytes Relative: 7 % (ref 3–12)
Neutro Abs: 3.5 10*3/uL (ref 1.7–7.7)
Neutro Abs: 2.7 10*3/uL (ref 1.7–7.7)
Neutrophils Relative %: 52 % (ref 43–77)
Neutrophils Relative %: 44 % (ref 43–77)
Platelets: 227 10*3/uL (ref 150–400)
Platelets: 216 10*3/uL (ref 150–400)
RBC: 4.52 MIL/uL (ref 3.87–5.11)
RBC: 4.6 MIL/uL (ref 3.87–5.11)
RDW: 13 % (ref 11.5–15.5)
RDW: 13 % (ref 11.5–15.5)
WBC: 6.6 10*3/uL (ref 4.0–10.5)
WBC: 6.2 10*3/uL (ref 4.0–10.5)

## 2013-01-06 LAB — BASIC METABOLIC PANEL
BUN: 15 mg/dL (ref 6–23)
CO2: 25 mEq/L (ref 19–32)
Calcium: 9.8 mg/dL (ref 8.4–10.5)
Chloride: 108 mEq/L (ref 96–112)
Creatinine, Ser: 0.55 mg/dL (ref 0.50–1.10)
GFR calc Af Amer: >90 mL/min (ref >90)
GFR calc non Af Amer: >90 mL/min (ref >90)
Glucose, Bld: 149 mg/dL — ABNORMAL HIGH (ref 70–99)
Potassium: 3.6 mEq/L (ref 3.5–5.1)
Sodium: 141 mEq/L (ref 135–145)

## 2013-01-06 LAB — GLUCOSE, CAPILLARY
Glucose-Capillary: 143 mg/dL — ABNORMAL HIGH (ref 70–99)
Glucose-Capillary: 159 mg/dL — ABNORMAL HIGH (ref 70–99)

## 2013-01-06 LAB — COMPREHENSIVE METABOLIC PANEL
ALT: 17 U/L (ref 0–35)
AST: 16 U/L (ref 0–37)
Albumin: 3.6 g/dL (ref 3.5–5.2)
Alkaline Phosphatase: 67 U/L (ref 39–117)
BUN: 14 mg/dL (ref 6–23)
CO2: 24 mEq/L (ref 19–32)
Calcium: 9.8 mg/dL (ref 8.4–10.5)
Chloride: 105 mEq/L (ref 96–112)
Creatinine, Ser: 0.6 mg/dL (ref 0.50–1.10)
GFR calc Af Amer: >90 mL/min (ref >90)
GFR calc non Af Amer: >90 mL/min (ref >90)
Glucose, Bld: 184 mg/dL — ABNORMAL HIGH (ref 70–99)
Potassium: 3.4 mEq/L — ABNORMAL LOW (ref 3.5–5.1)
Sodium: 139 mEq/L (ref 135–145)
Total Bilirubin: 0.2 mg/dL — ABNORMAL LOW (ref 0.3–1.2)
Total Protein: 6.7 g/dL (ref 6.0–8.3)

## 2013-01-06 LAB — PROTIME-INR
INR: 1.01 (ref 0.00–1.49)
Prothrombin Time: 13.2 seconds (ref 11.6–15.2)

## 2013-01-06 MED ORDER — ZOLPIDEM TARTRATE 5 MG PO TABS: 5.0000 mg | ORAL_TABLET | Freq: Every evening | ORAL | Status: DC | PRN

## 2013-01-06 MED ORDER — INSULIN ASPART 100 UNIT/ML ~~LOC~~ SOLN
10.0000 [IU] | Freq: Three times a day (TID) | SUBCUTANEOUS | Status: DC
  Administered 2013-01-07 – 2013-01-09 (×2): 10 [IU] via SUBCUTANEOUS

## 2013-01-06 MED ORDER — INSULIN GLARGINE 100 UNIT/ML ~~LOC~~ SOLN: 15.0000 [IU] | Freq: Every day | SUBCUTANEOUS | Status: DC

## 2013-01-06 MED ORDER — SODIUM CHLORIDE 0.9 % IV SOLN
INTRAVENOUS | Status: DC
  Administered 2013-01-06: 21:00:00 via INTRAVENOUS

## 2013-01-06 MED ORDER — RANOLAZINE ER 500 MG PO TB12
500.0000 mg | ORAL_TABLET | Freq: Two times a day (BID) | ORAL | Status: DC
  Administered 2013-01-06 – 2013-01-09 (×6): 500 mg via ORAL
  Filled 2013-01-06 (×7): qty 1

## 2013-01-06 MED ORDER — ASPIRIN 81 MG PO CHEW
81.0000 mg | CHEWABLE_TABLET | Freq: Every day | ORAL | Status: DC
  Administered 2013-01-07 – 2013-01-09 (×3): 81 mg via ORAL
  Filled 2013-01-06 (×3): qty 1

## 2013-01-06 MED ORDER — ALPRAZOLAM 0.5 MG PO TABS
0.5000 mg | ORAL_TABLET | Freq: Two times a day (BID) | ORAL | Status: DC | PRN
  Administered 2013-01-07 (×2): 0.5 mg via ORAL
  Filled 2013-01-06 (×2): qty 1

## 2013-01-06 MED ORDER — INSULIN GLARGINE 100 UNIT/ML ~~LOC~~ SOLN
31.0000 [IU] | Freq: Every day | SUBCUTANEOUS | Status: DC
  Administered 2013-01-06 – 2013-01-08 (×3): 31 [IU] via SUBCUTANEOUS

## 2013-01-06 MED ORDER — ATORVASTATIN CALCIUM 80 MG PO TABS
80.0000 mg | ORAL_TABLET | Freq: Every day | ORAL | Status: DC
  Administered 2013-01-07 – 2013-01-08 (×2): 80 mg via ORAL
  Filled 2013-01-06 (×3): qty 1

## 2013-01-06 MED ORDER — EPTIFIBATIDE BOLUS VIA INFUSION
180.0000 ug/kg | Freq: Once | INTRAVENOUS | Status: AC
  Administered 2013-01-06: 11900 ug via INTRAVENOUS
  Filled 2013-01-06: qty 16

## 2013-01-06 MED ORDER — LISINOPRIL 40 MG PO TABS
40.0000 mg | ORAL_TABLET | Freq: Every day | ORAL | Status: DC
  Administered 2013-01-07 – 2013-01-09 (×3): 40 mg via ORAL
  Filled 2013-01-06 (×4): qty 1

## 2013-01-06 MED ORDER — ISOSORBIDE MONONITRATE ER 60 MG PO TB24
60.0000 mg | ORAL_TABLET | Freq: Every day | ORAL | Status: DC
  Administered 2013-01-07 – 2013-01-09 (×3): 60 mg via ORAL
  Filled 2013-01-06 (×4): qty 1

## 2013-01-06 MED ORDER — INSULIN ASPART 100 UNIT/ML ~~LOC~~ SOLN: 0.0000 [IU] | Freq: Three times a day (TID) | SUBCUTANEOUS | Status: DC

## 2013-01-06 MED ORDER — CEFAZOLIN SODIUM-DEXTROSE 2-3 GM-% IV SOLR
2.0000 g | INTRAVENOUS | Status: DC
Start: 2013-01-07 — End: 2013-01-08
  Filled 2013-01-06 (×2): qty 50

## 2013-01-06 MED ORDER — EPTIFIBATIDE 75 MG/100ML IV SOLN
2.0000 ug/kg/min | INTRAVENOUS | Status: AC
  Administered 2013-01-06 – 2013-01-07 (×3): 2 ug/kg/min via INTRAVENOUS
  Filled 2013-01-06 (×6): qty 100

## 2013-01-06 MED ORDER — METOPROLOL TARTRATE 100 MG PO TABS
100.0000 mg | ORAL_TABLET | Freq: Two times a day (BID) | ORAL | Status: DC
  Administered 2013-01-06 – 2013-01-09 (×5): 100 mg via ORAL
  Filled 2013-01-06 (×7): qty 1

## 2013-01-06 NOTE — Progress Notes (Signed)
ANTICOAGULATION CONSULT NOTE - Initial Consult  Pharmacy Consult for Integrilin Indication: Bridge for Brilinta  No Known Allergies  Patient Measurements: Height: 5\' 2"  (157.5 cm) Weight: 146 lb (66.225 kg) IBW/kg (Calculated) : 50.1   Vital Signs: Temp: 97.9 F (36.6 C) (03/10 1553) Temp src: Oral (03/10 1553) BP: 162/87 mmHg (03/10 1553) Pulse Rate: 85 (03/10 1553)  Labs:  Recent Labs  01/06/13 1658  HGB 13.5  HCT 38.8  PLT 227  CREATININE 0.55    Estimated Creatinine Clearance: 65 ml/min (by C-G formula based on Cr of 0.55).   Medical History: Past Medical History  Diagnosis Date  . HTN (hypertension)   . Diabetes mellitus without complication   . Stroke, embolic   . Myocardial infarction 09/07/12  . Breast cancer      Assessment: 27 YOF with hx of STEMI (11/13), s/p DES placement, and has been on Brilinta. She is scheduled for Lumpectomy (3/12) and Brilinta was stopped on 3/7. Pharmacy is consulted to start Integrilin for antiplatelet bridge therapy.  H/H = 13.5/38.8, plts 227K, scr 0.55, est. crcl 65 ml/min. Wt = 66kg  Goal of Therapy:  Appropriate Integrilin dosing Monitor platelets by anticoagulation protocol: Yes   Plan:  - Integrilin Bolus 11.9mg  IV x 1 - Integrilin Infusion 63mcg/kg/min = 132.76mcg/min = 10.54ml/hr - Will f/u AM CBC and signs and symptoms of bleeding   Bayard Hugger, PharmD, BCPS  Clinical Pharmacist  Pager: [phone]   01/06/2013,6:55 PM

## 2013-01-06 NOTE — Progress Notes (Signed)
Pt admitted from home, VSS, pt resting in bed with husband at bedside. PA called for orders. Will continue to monitor.

## 2013-01-06 NOTE — Progress Notes (Signed)
[facility]  Subjective: No N, V, fever, CP, SOB, orthopnea, PND, ABD pain, cough, congestion.  Objective: Vital signs in last 24 hours: Temp:  [97.9 F (36.6 C)] 97.9 F (36.6 C) (03/10 1553) Pulse Rate:  [85] 85 (03/10 1553) Resp:  [20] 20 (03/10 1553) BP: (162)/(87) 162/87 mmHg (03/10 1553) SpO2:  [99 %] 99 % (03/10 1553) Last BM Date: 01/05/13  Intake/Output from previous day:   Intake/Output this shift:    Medications Current Facility-Administered Medications  Medication Dose Route Frequency Provider Last Rate Last Dose  . 0.9 %  sodium chloride infusion   Intravenous Continuous Nada Boozer, NP      . ALPRAZolam Prudy Feeler) tablet 0.5 mg  0.5 mg Oral BID PRN Nada Boozer, NP      . aspirin chewable tablet 81 mg  81 mg Oral Daily Nada Boozer, NP      . Melene Muller ON 01/07/2013] atorvastatin (LIPITOR) tablet 80 mg  80 mg Oral q1800 Nada Boozer, NP      . Melene Muller ON 01/07/2013] ceFAZolin (ANCEF) IVPB 2 g/50 mL premix  2 g Intravenous On Call to OR Emelia Loron, MD      . Melene Muller ON 01/07/2013] insulin aspart (novoLOG) injection 0-9 Units  0-9 Units Subcutaneous TID WC Nada Boozer, NP      . Melene Muller ON 01/07/2013] insulin aspart (novoLOG) injection 10 Units  10 Units Subcutaneous TID WC Nada Boozer, NP      . insulin glargine (LANTUS) injection 15 Units  15 Units Subcutaneous QHS Nada Boozer, NP      . isosorbide mononitrate (IMDUR) 24 hr tablet 60 mg  60 mg Oral Daily Nada Boozer, NP      . lisinopril (PRINIVIL,ZESTRIL) tablet 40 mg  40 mg Oral Daily Nada Boozer, NP      . metoprolol (LOPRESSOR) tablet 100 mg  100 mg Oral BID Nada Boozer, NP      . ranolazine (RANEXA) 12 hr tablet 500 mg  500 mg Oral BID Nada Boozer, NP      . zolpidem (AMBIEN) tablet 5 mg  5 mg Oral QHS PRN Nada Boozer, NP        PE: General appearance: alert, cooperative and no distress Lungs: clear to auscultation bilaterally Heart: regular rate and rhythm, S1, S2  normal, no murmur, click, rub or gallop Abdomen: Nontender, +BS all quads, no distention Extremities: No LEE Pulses: 2+ and symmetric Skin: Warm and dry Neurologic: Grossly normal  Lab Results:   Recent Labs  01/06/13 1658  WBC 6.6  HGB 13.5  HCT 38.8  PLT 227   BMET  Recent Labs  01/06/13 1658  NA 141  K 3.6  CL 108  CO2 25  GLUCOSE 149*  BUN 15  CREATININE 0.55  CALCIUM 9.8     Assessment/Plan    Active Problems:   Essential hypertension, benign   Diabetes type 2, uncontrolled   Dyslipidemia   CAD, urgent LAD DES 09/07/12, residual RCA and Dx disease, EF 45-50%   Cancer of upper-inner quadrant of female breast  Plan:   The patient is a 62 yo female with a history of anterior wall STEMI in November of last year, breast cancer, HTN, dyslipidemia.  She has been taking Brilinta since November and stopped it last Friday per Dr. Landry Dyke instructions.  She will be started on integrilin tonight and it will be stopped 12 hours prior to the scheduled breast surgery on 01/08/13.  Labs pending.  See  Dr. Landry Dyke note from 11/2412 for full H&P.   LOS: 0 days    Marisa Forbes 01/06/2013 6:37 PM

## 2013-01-07 ENCOUNTER — Ambulatory Visit: Payer: BC Managed Care – PPO | Admitting: Oncology

## 2013-01-07 LAB — CBC
HCT: 41.3 % (ref 36.0–46.0)
Hemoglobin: 14.3 g/dL (ref 12.0–15.0)
MCH: 29.7 pg (ref 26.0–34.0)
MCHC: 34.6 g/dL (ref 30.0–36.0)
MCV: 85.9 fL (ref 78.0–100.0)
Platelets: 230 10*3/uL (ref 150–400)
RBC: 4.81 MIL/uL (ref 3.87–5.11)
RDW: 12.9 % (ref 11.5–15.5)
WBC: 4.9 10*3/uL (ref 4.0–10.5)

## 2013-01-07 LAB — GLUCOSE, CAPILLARY
Glucose-Capillary: 102 mg/dL — ABNORMAL HIGH (ref 70–99)
Glucose-Capillary: 112 mg/dL — ABNORMAL HIGH (ref 70–99)
Glucose-Capillary: 66 mg/dL — ABNORMAL LOW (ref 70–99)

## 2013-01-07 LAB — TSH: TSH: 1.315 u[IU]/mL (ref 0.350–4.500)

## 2013-01-07 MED ORDER — CEFAZOLIN SODIUM-DEXTROSE 2-3 GM-% IV SOLR: 2.0000 g | Freq: Once | INTRAVENOUS | Status: DC

## 2013-01-07 NOTE — Progress Notes (Signed)
Patient ID: Marisa Forbes, female   DOB: 1951-10-17, 62 y.o.   MRN: [id number] Patient doing well, will have wire placed at 10 am tomorrow for surgery.  Will stop integrilin per cards note at 10 pm 12 hours prior to procedure.  Discussed surgery again with patient and husband.

## 2013-01-07 NOTE — Progress Notes (Signed)
[facility]  Subjective: No chest pain.   Objective: Vital signs in last 24 hours: Temp:  [97.5 F (36.4 C)-98 F (36.7 C)] 98 F (36.7 C) (03/11 0716) Pulse Rate:  [76-90] 76 (03/11 0716) Resp:  [18-20] 18 (03/11 0716) BP: (162-166)/(86-88) 162/86 mmHg (03/11 0716) SpO2:  [97 %-99 %] 99 % (03/11 0716) Weight:  [146 lb (66.225 kg)] 146 lb (66.225 kg) (03/10 1553) Last BM Date: 01/05/13  Intake/Output from previous day:   Intake/Output this shift: Total I/O In: 360 [P.O.:360] Out: -   Medications Current Facility-Administered Medications  Medication Dose Route Frequency [place] Last Rate Last Dose  . 0.9 %  sodium chloride infusion   Intravenous Continuous Nada Boozer, NP 20 mL/hr at 01/06/13 2121    . ALPRAZolam Prudy Feeler) tablet 0.5 mg  0.5 mg Oral BID PRN Nada Boozer, NP      . aspirin chewable tablet 81 mg  81 mg Oral Daily Nada Boozer, NP      . atorvastatin (LIPITOR) tablet 80 mg  80 mg Oral q1800 Nada Boozer, NP      . ceFAZolin (ANCEF) IVPB 2 g/50 mL premix  2 g Intravenous On Call to OR Emelia Loron, MD      . eptifibatide (INTEGRILIN) 75 mg / 100 mL infusion  2 mcg/kg/min Intravenous Continuous Emelia Loron, MD 10.6 mL/hr at 01/06/13 2123 2 mcg/kg/min at 01/06/13 2123  . insulin aspart (novoLOG) injection 0-9 Units  0-9 Units Subcutaneous TID WC Nada Boozer, NP      . insulin aspart (novoLOG) injection 10 Units  10 Units Subcutaneous TID WC Nada Boozer, NP      . insulin glargine (LANTUS) injection 31 Units  31 Units Subcutaneous QHS [name], PA-C   31 Units at 01/06/13 2152  . isosorbide mononitrate (IMDUR) 24 hr tablet 60 mg  60 mg Oral Daily Nada Boozer, NP      . lisinopril (PRINIVIL,ZESTRIL) tablet 40 mg  40 mg Oral Daily Nada Boozer, NP      . metoprolol (LOPRESSOR) tablet 100 mg  100 mg Oral BID Nada Boozer, NP   100 mg at 01/06/13 2108  . ranolazine (RANEXA) 12 hr tablet 500 mg  500 mg Oral BID Nada Boozer,  NP   500 mg at 01/06/13 2108  . zolpidem (AMBIEN) tablet 5 mg  5 mg Oral QHS PRN Nada Boozer, NP        PE: General appearance: alert, cooperative and no distress Lungs: clear to auscultation bilaterally Heart: regular rate and rhythm Extremities: no LEE Pulses: 2+ and symmetric Skin: warm and dry Neurologic: Grossly normal  Lab Results:   Recent Labs  01/06/13 1658 01/06/13 2205 01/07/13 0555  WBC 6.6 6.2 4.9  HGB 13.5 13.8 14.3  HCT 38.8 39.6 41.3  PLT 227 216 230   BMET  Recent Labs  01/06/13 1658 01/06/13 2205  NA 141 139  K 3.6 3.4*  CL 108 105  CO2 25 24  GLUCOSE 149* 184*  BUN 15 14  CREATININE 0.55 0.60  CALCIUM 9.8 9.8   PT/INR  Recent Labs  01/06/13 2205  LABPROT 13.2  INR 1.01    Assessment/Plan  Active Problems:   Essential hypertension, benign   Diabetes type 2, uncontrolled   Dyslipidemia   CAD, urgent LAD DES 09/07/12, residual RCA and Dx disease, EF 45-50%   Cancer of upper-inner quadrant of female breast  Plan: Pt is S/P PCI + stenting to LAD w/ DES  on 09/07/12. On dual antiplatelet therapy w/ ASA + Brilinta. She has breast cancer and is scheduled to undergo left breast wire guided lumpectomy and left axillary sentinel node bx on 3/12. Brilinta is temporarily being held and she is now on integrilin. She denies CP. NSR on telemetry. HR is stable. Keep on integrilin and stop 12 hours prior to scheduled procedure.    LOS: 1 day    Brittainy M. Sharol Harness, PA-C 01/07/2013 11:12 AM

## 2013-01-07 NOTE — Progress Notes (Signed)
ANTICOAGULATION CONSULT NOTE - F/U Consult  Pharmacy Consult for Integrilin Indication: Bridge for Brilinta  No Known Allergies  Patient Measurements: Height: 5\' 2"  (157.5 cm) Weight: 146 lb (66.225 kg) IBW/kg (Calculated) : 50.1   Vital Signs: Temp: 98 F (36.7 C) (03/11 0716) Temp src: Oral (03/11 0716) BP: 162/86 mmHg (03/11 0716) Pulse Rate: 76 (03/11 0716)  Labs:  Recent Labs  01/06/13 1658 01/06/13 2205 01/07/13 0555  HGB 13.5 13.8 14.3  HCT 38.8 39.6 41.3  PLT 227 216 230  LABPROT  --  13.2  --   INR  --  1.01  --   CREATININE 0.55 0.60  --     Estimated Creatinine Clearance: 65 ml/min (by C-G formula based on Cr of 0.6).   Medical History: Past Medical History  Diagnosis Date  . HTN (hypertension)   . Diabetes mellitus without complication   . Stroke, embolic   . Myocardial infarction 09/07/12  . Breast cancer      Assessment: 35 YOF with hx of STEMI (11/13), s/p DES placement on Brilinta. She is scheduled for Lumpectomy (3/12) and Brilinta was stopped on 3/7. Pharmacy is consulted to start Integrilin for antiplatelet bridge therapy.   Anticoag: CBC and platelets stable today on Integrelin at 2mg /kg/min. CrCl stable at 65. No bleeding noted.  Cards: HTN, MI: 162/86, HR 76 on ASA 81mg , Lipitor, Imdur, lisinopril, metoprolol, Ranexa  Endo: DM: 102-159 on SSI and lantus  Neuro: h/o CVA  Heme/Onc: New high grade ductal carcinoma in situ with necrosis  Goal of Therapy:  Appropriate Integrilin dosing adjusted for renal function Monitor platelets by anticoagulation protocol: Yes   Plan:  - Integrilin Infusion 88mcg/kg/min = 132.23mcg/min = 10.41ml/hr - Will f/u AM CBC and signs and symptoms of bleeding - MD advised Integrelin off 3/12 at 1000 for surgery at 1200   Crystal S. Merilynn Finland, PharmD, BCPS Clinical Staff Pharmacist Pager [phone]   01/07/2013,8:50 AM

## 2013-01-07 NOTE — Progress Notes (Addendum)
Patient ID: Marisa Forbes, female   DOB: 1951-01-31, 62 y.o.   MRN: [id number] Attempted to see patient this am but she and husband both asleep .  Will try to stop by later again. Appreciate Dr Tresa Endo and his team's assistance.  integrilin will just need to be off for wire placement tomorrow at 10 and surgery at noon.

## 2013-01-07 NOTE — Progress Notes (Signed)
Pt. Seen and examined. Agree with the NP/PA-C note as written.  No active cardiac issues - continue integrillin with interruption around the time of biopsy as per Dr. Doreen Salvage directions.  We are available as needed.  Chrystie Nose, MD, [facility] Attending Cardiologist [facility]

## 2013-01-07 NOTE — H&P (Signed)
31 yof with no breast complaints who presented for screening mammogram showing possible mass in both breasts. She underwent further mmg and u/s that showed a suspicious about 1.8 cm mass in outer left breast with no left axillary lad. The right side was benign. She also underwent mri that showed a 1.6 cm mass in left breast with no additional findings. 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 This is triple negative and Ki67 is 40%. We planned on lump/sn and hold on port for now.  I have discussed her with Dr. Tresa Endo who will manage antiplatelet therapy around surgery.   Past Medical History   Diagnosis  Date   .  HTN (hypertension)    .  Diabetes mellitus without complication    .  Stroke, embolic    .  Myocardial infarction  09/07/12   .  Breast cancer     Past Surgical History   Procedure  Laterality  Date   .  Back surgery     .  Coronary angioplasty with stent placement      Family History   Problem  Relation  Age of Onset   .  Cancer  Neg Hx    .  Diabetes  Neg Hx    .  Hyperlipidemia  Neg Hx    .  Hypertension  Neg Hx    .  Kidney disease  Neg Hx    .  Stroke  Neg Hx    Social History  History   Substance Use Topics   .  Smoking status:  Never Smoker   .  Smokeless tobacco:  Never Used   .  Alcohol Use:  No      Comment: none   No Known Allergies  Current Outpatient Prescriptions   Medication  Sig  Dispense  Refill   .  aspirin EC 81 MG EC tablet  Take 1 tablet (81 mg total) by mouth daily.     Marland Kitchen  atorvastatin (LIPITOR) 80 MG tablet  Take 1 tablet (80 mg total) by mouth daily at 6 PM.  30 tablet  5   .  diazepam (VALIUM) 5 MG tablet  Take 1 tablet (5 mg total) by mouth every 6 (six) hours as needed for anxiety.  60 tablet  1   .  glucose blood (ONETOUCH VERIO) test strip  Use TID  100 each  12   .  ibuprofen (ADVIL,MOTRIN) 200 MG tablet  Take 200 mg by mouth every 6 (six) hours as needed. For pain      .  insulin aspart (NOVOLOG) 100 UNIT/ML injection  Inject 10 Units into the skin 3 (three) times daily with meals.  1 vial  11   .  insulin glargine (LANTUS SOLOSTAR) 100 UNIT/ML injection  Inject 31 Units into the skin at bedtime.  9 mL  12   .  isosorbide mononitrate (IMDUR) 60 MG 24 hr tablet  Take 1 tablet (60 mg total) by mouth daily.  30 tablet  5   .  lisinopril (PRINIVIL,ZESTRIL) 10 MG tablet  Take 20 mg by mouth daily.     .  metFORMIN (GLUCOPHAGE-XR) 750 MG 24 hr tablet  Take 1 tablet (750 mg total) by mouth daily with breakfast.  30 tablet  5   .  metoprolol (LOPRESSOR) 100 MG tablet  Take 1 tablet (100 mg total) by mouth 2 (two) times daily.  60 tablet  5   .  nitroGLYCERIN (NITROSTAT) 0.4 MG SL tablet  Place 1 tablet (0.4 mg total) under the tongue every 5 (five) minutes x 3 doses as needed for chest pain.  25 tablet  5   .  ranolazine (RANEXA) 500 MG 12 hr tablet  Take 1 tablet (500 mg total) by mouth 2 (two) times daily.  60 tablet  5   .  Ticagrelor (BRILINTA) 90 MG TABS tablet  Take 1 tablet (90 mg total) by mouth 2 (two) times daily.  60 tablet  11    No current facility-administered medications for this visit.   Review of Systems  Review of Systems  Constitutional: Negative for fever, chills and unexpected weight change.  HENT: Negative for hearing loss, congestion, sore throat, trouble swallowing and voice change.  Eyes: Negative for visual disturbance.  Respiratory: Negative for cough and wheezing.  Cardiovascular: Negative for chest pain, palpitations and leg swelling.  Gastrointestinal: Negative for nausea, vomiting, abdominal pain, diarrhea, constipation, blood in stool, abdominal distention and anal bleeding.  Genitourinary: Negative for hematuria, vaginal bleeding and difficulty urinating.  Musculoskeletal: Negative for arthralgias.  Skin: Negative for rash and wound.  Neurological: Negative for seizures, syncope and headaches.  Hematological: Negative for  adenopathy. Does not bruise/bleed easily.  Psychiatric/Behavioral: Negative for confusion.  There were no vitals taken for this visit.  Physical Exam  Physical Exam  Vitals reviewed.  Constitutional: She appears well-developed and well-nourished.  Eyes: No scleral icterus.  Neck: Neck supple.  Cardiovascular: Normal rate, regular rhythm and normal heart sounds.  Pulmonary/Chest: Effort normal and breath sounds normal. She has no wheezes. She has no rales. Right breast exhibits no inverted nipple, no mass, no nipple discharge, no skin change and no tenderness. Left breast exhibits no inverted nipple, no mass, no nipple discharge, no skin change and no tenderness. Breasts are symmetrical.  Lymphadenopathy:  She has no cervical adenopathy.  She has no axillary adenopathy.  Right: No supraclavicular adenopathy present.  Left: No supraclavicular adenopathy present.  Data Reviewed  BILATERAL BREAST MRI WITH AND WITHOUT CONTRAST  Technique: Multiplanar, multisequence MR images of both breasts  were obtained prior to and following the intravenous administration  of 12 ml of Multihance. Three dimensional images were evaluated at  the independent DynaCad workstation.  Comparison: 11/12/2012 mammogram and 12/03/2012 breast ultrasound.  Findings: There is minimal bilateral parenchymal background  enhancement. There is an irregular enhancing mass with central  clip artifact located laterally within the left breast (posterior  one third) at the 9 o'clock position (left upper-outer quadrant).  This is associated with a mixture of plateau and wash in/washout  enhancement kinetics. The mass measures 1.6 x 1.5 x 1.5 cm in size  and there is mild adjacent post biopsy change present laterally  located. There are no other worrisome enhancing foci within either  breast. There is no evidence for axillary or internal mammary  adenopathy and there are no additional findings.  IMPRESSION:  1.6 cm irregular  enhancing mass located within the left breast at  the 9 o'clock position corresponding to the recently diagnosed left  breast invasive ductal carcinoma. No additional findings.  Assessment  Clinical stage I left breast cancer  Plan   left breast wire guided lumpectomy/snbx  We discussed the staging and pathophysiology of breast cancer. We discussed all of the different options for treatment for breast cancer including surgery, chemotherapy, radiation therapy, Herceptin, and antiestrogen therapy.  We discussed a sentinel lymph node biopsy as she does not appear to  having lymph node involvement right now. We discussed the performance of that with injection of radioactive tracer and blue dye. We discussed that she would have an incision underneath her axillary hairline. We discussed that there is a bout a 10-20% chance of having a positive node with a sentinel lymph node biopsy and we will await the permanent pathology to make any other first further decisions in terms of her treatment. One of these options might be to return to the operating room to perform an axillary lymph node dissection. We discussed about a 1-2% risk lifetime of chronic shoulder pain as well as lymphedema associated with a sentinel lymph node biopsy.  We discussed the options for treatment of the breast cancer which included lumpectomy versus a mastectomy. We discussed the performance of the lumpectomy with a wire placement. We discussed a 10-20% chance of a positive margin requiring reexcision in the operating room. We also discussed that she may need radiation therapy or antiestrogen therapy or both if she undergoes lumpectomy. We discussed the mastectomy and the postoperative care for that as well. We discussed that there is no difference in her survival whether she undergoes lumpectomy with radiation therapy or antiestrogen therapy versus a mastectomy. There is a slight difference in the local recurrence rate being 3-5% with  lumpectomy and about 1% with a mastectomy.  We discussed the risks of operation including bleeding, infection, possible reoperation. She understands her further therapy will be based on what her stages at the time of her operation.

## 2013-01-08 ENCOUNTER — Inpatient Hospital Stay (HOSPITAL_COMMUNITY)
Admission: RE | Admit: 2013-01-08 | Discharge: 2013-01-08 | Disposition: A | Discharge status: Home / Self Care | Payer: BC Managed Care – PPO | Source: Ambulatory Visit | Attending: General Surgery | Admitting: General Surgery

## 2013-01-08 ENCOUNTER — Encounter (HOSPITAL_COMMUNITY): Payer: Self-pay | Admitting: Anesthesiology

## 2013-01-08 ENCOUNTER — Inpatient Hospital Stay (HOSPITAL_COMMUNITY): Payer: BC Managed Care – PPO | Admitting: Anesthesiology

## 2013-01-08 ENCOUNTER — Encounter (HOSPITAL_COMMUNITY)
Admission: RE | Disposition: A | Discharge status: Home / Self Care | Payer: Self-pay | Source: Ambulatory Visit | Attending: General Surgery

## 2013-01-08 ENCOUNTER — Inpatient Hospital Stay
Admission: RE | Admit: 2013-01-08 | Discharge: 2013-01-08 | Disposition: A | Discharge status: Home / Self Care | Payer: BC Managed Care – PPO | Source: Ambulatory Visit | Attending: General Surgery | Admitting: General Surgery

## 2013-01-08 ENCOUNTER — Inpatient Hospital Stay
Admit: 2013-01-08 | Discharge: 2013-01-08 | Disposition: A | Discharge status: Home / Self Care | Payer: BC Managed Care – PPO | Attending: General Surgery | Admitting: General Surgery

## 2013-01-08 DIAGNOSIS — C50219 Malignant neoplasm of upper-inner quadrant of unspecified female breast: Secondary | ICD-10-CM

## 2013-01-08 DIAGNOSIS — D0590 Unspecified type of carcinoma in situ of unspecified breast: Secondary | ICD-10-CM

## 2013-01-08 HISTORY — PX: BREAST LUMPECTOMY: SHX2

## 2013-01-08 HISTORY — PX: BREAST LUMPECTOMY WITH NEEDLE LOCALIZATION AND AXILLARY SENTINEL LYMPH NODE BX: SHX5760

## 2013-01-08 LAB — GLUCOSE, CAPILLARY
Glucose-Capillary: 170 mg/dL — ABNORMAL HIGH (ref 70–99)
Glucose-Capillary: 91 mg/dL (ref 70–99)
Glucose-Capillary: 113 mg/dL — ABNORMAL HIGH (ref 70–99)
Glucose-Capillary: 48 mg/dL — ABNORMAL LOW (ref 70–99)
Glucose-Capillary: 51 mg/dL — ABNORMAL LOW (ref 70–99)
Glucose-Capillary: 62 mg/dL — ABNORMAL LOW (ref 70–99)
Glucose-Capillary: 138 mg/dL — ABNORMAL HIGH (ref 70–99)

## 2013-01-08 SURGERY — BREAST LUMPECTOMY WITH NEEDLE LOCALIZATION AND AXILLARY SENTINEL LYMPH NODE BX
Anesthesia: General | Site: Breast | Laterality: Left | Wound class: Clean

## 2013-01-08 MED ORDER — FENTANYL CITRATE 0.05 MG/ML IJ SOLN: 50.0000 ug | INTRAMUSCULAR | Status: DC | PRN

## 2013-01-08 MED ORDER — LACTATED RINGERS IV SOLN
INTRAVENOUS | Status: DC | PRN
  Administered 2013-01-08 (×2): via INTRAVENOUS

## 2013-01-08 MED ORDER — MIDAZOLAM HCL 2 MG/2ML IJ SOLN
INTRAMUSCULAR | Status: AC
  Administered 2013-01-08: 2 mg
  Filled 2013-01-08: qty 2

## 2013-01-08 MED ORDER — TECHNETIUM TC 99M SULFUR COLLOID FILTERED
1.0000 | Freq: Once | INTRAVENOUS | Status: AC | PRN
  Administered 2013-01-08: 1 via INTRADERMAL

## 2013-01-08 MED ORDER — MIDAZOLAM HCL 5 MG/ML IJ SOLN: 2.0000 mg | Freq: Once | INTRAMUSCULAR | Status: DC

## 2013-01-08 MED ORDER — HYDROMORPHONE HCL PF 1 MG/ML IJ SOLN
INTRAMUSCULAR | Status: AC
  Filled 2013-01-08: qty 1

## 2013-01-08 MED ORDER — LIDOCAINE HCL (CARDIAC) 20 MG/ML IV SOLN
INTRAVENOUS | Status: DC | PRN
  Administered 2013-01-08: 50 mg via INTRAVENOUS

## 2013-01-08 MED ORDER — ARTIFICIAL TEARS OP OINT
TOPICAL_OINTMENT | OPHTHALMIC | Status: DC | PRN
  Administered 2013-01-08: 1 via OPHTHALMIC

## 2013-01-08 MED ORDER — MIDAZOLAM HCL 2 MG/2ML IJ SOLN: 1.0000 mg | INTRAMUSCULAR | Status: DC | PRN

## 2013-01-08 MED ORDER — ACETAMINOPHEN 650 MG RE SUPP: 650.0000 mg | Freq: Four times a day (QID) | RECTAL | Status: DC | PRN

## 2013-01-08 MED ORDER — BUPIVACAINE-EPINEPHRINE 0.25% -1:200000 IJ SOLN
INTRAMUSCULAR | Status: DC | PRN
  Administered 2013-01-08: 20 mL

## 2013-01-08 MED ORDER — OXYCODONE HCL 5 MG PO TABS: 5.0000 mg | ORAL_TABLET | Freq: Once | ORAL | Status: DC | PRN

## 2013-01-08 MED ORDER — DEXTROSE 50 % IV SOLN
INTRAVENOUS | Status: AC
  Administered 2013-01-08: 12.5 mL
  Filled 2013-01-08: qty 50

## 2013-01-08 MED ORDER — FENTANYL CITRATE 0.05 MG/ML IJ SOLN
INTRAMUSCULAR | Status: DC | PRN
  Administered 2013-01-08 (×2): 50 ug via INTRAVENOUS

## 2013-01-08 MED ORDER — HYDROMORPHONE HCL PF 1 MG/ML IJ SOLN
0.2500 mg | INTRAMUSCULAR | Status: DC | PRN
  Administered 2013-01-08: 0.5 mg via INTRAVENOUS

## 2013-01-08 MED ORDER — LACTATED RINGERS IV SOLN
INTRAVENOUS | Status: DC
  Administered 2013-01-08: 12:00:00 via INTRAVENOUS

## 2013-01-08 MED ORDER — ONDANSETRON HCL 4 MG/2ML IJ SOLN: 4.0000 mg | Freq: Four times a day (QID) | INTRAMUSCULAR | Status: DC | PRN

## 2013-01-08 MED ORDER — FENTANYL CITRATE 0.05 MG/ML IJ SOLN: 100.0000 ug | Freq: Once | INTRAMUSCULAR | Status: AC

## 2013-01-08 MED ORDER — OXYCODONE HCL 5 MG/5ML PO SOLN: 5.0000 mg | Freq: Once | ORAL | Status: DC | PRN

## 2013-01-08 MED ORDER — EPHEDRINE SULFATE 50 MG/ML IJ SOLN
INTRAMUSCULAR | Status: DC | PRN
  Administered 2013-01-08 (×2): 5 mg via INTRAVENOUS

## 2013-01-08 MED ORDER — SODIUM CHLORIDE 0.9 % IV SOLN
INTRAVENOUS | Status: DC
  Administered 2013-01-08 (×2): via INTRAVENOUS

## 2013-01-08 MED ORDER — ONDANSETRON HCL 4 MG/2ML IJ SOLN
INTRAMUSCULAR | Status: DC | PRN
  Administered 2013-01-08: 4 mg via INTRAVENOUS

## 2013-01-08 MED ORDER — PROPOFOL 10 MG/ML IV BOLUS
INTRAVENOUS | Status: DC | PRN
  Administered 2013-01-08: 150 mg via INTRAVENOUS

## 2013-01-08 MED ORDER — EPTIFIBATIDE 75 MG/100ML IV SOLN
2.0000 ug/kg/min | INTRAVENOUS | Status: DC
  Administered 2013-01-08: 2 ug/kg/min via INTRAVENOUS
  Filled 2013-01-08 (×4): qty 100

## 2013-01-08 MED ORDER — MORPHINE SULFATE 2 MG/ML IJ SOLN: 2.0000 mg | INTRAMUSCULAR | Status: DC | PRN

## 2013-01-08 MED ORDER — CEFAZOLIN SODIUM-DEXTROSE 2-3 GM-% IV SOLR
INTRAVENOUS | Status: DC | PRN
  Administered 2013-01-08: 2 g via INTRAVENOUS

## 2013-01-08 MED ORDER — 0.9 % SODIUM CHLORIDE (POUR BTL) OPTIME
TOPICAL | Status: DC | PRN
  Administered 2013-01-08: 1000 mL

## 2013-01-08 MED ORDER — ACETAMINOPHEN 325 MG PO TABS: 650.0000 mg | ORAL_TABLET | Freq: Four times a day (QID) | ORAL | Status: DC | PRN

## 2013-01-08 MED ORDER — METHYLENE BLUE 1 % INJ SOLN
INTRAMUSCULAR | Status: AC
  Filled 2013-01-08: qty 10

## 2013-01-08 MED ORDER — TICAGRELOR 90 MG PO TABS
90.0000 mg | ORAL_TABLET | Freq: Two times a day (BID) | ORAL | Status: DC
  Administered 2013-01-09: 90 mg via ORAL
  Filled 2013-01-08 (×2): qty 1

## 2013-01-08 MED ORDER — FENTANYL CITRATE 0.05 MG/ML IJ SOLN
INTRAMUSCULAR | Status: AC
  Administered 2013-01-08: 100 ug via INTRAVENOUS
  Filled 2013-01-08: qty 2

## 2013-01-08 MED ORDER — EPTIFIBATIDE 75 MG/100ML IV SOLN
2.0000 ug/kg/min | INTRAVENOUS | Status: DC
  Filled 2013-01-08 (×2): qty 100

## 2013-01-08 MED ORDER — OXYCODONE HCL 5 MG PO TABS: 5.0000 mg | ORAL_TABLET | ORAL | Status: DC | PRN

## 2013-01-08 MED ORDER — BUPIVACAINE-EPINEPHRINE 0.25% -1:200000 IJ SOLN
INTRAMUSCULAR | Status: AC
  Filled 2013-01-08: qty 1

## 2013-01-08 SURGICAL SUPPLY — 59 items
ADH SKN CLS APL DERMABOND .7 (GAUZE/BANDAGES/DRESSINGS) ×1
APL SKNCLS STERI-STRIP NONHPOA (GAUZE/BANDAGES/DRESSINGS)
APPLIER CLIP 9.375 MED OPEN (MISCELLANEOUS) ×2
APR CLP MED 9.3 20 MLT OPN (MISCELLANEOUS) ×1
BENZOIN TINCTURE PRP APPL 2/3 (GAUZE/BANDAGES/DRESSINGS) IMPLANT
BINDER BREAST LRG (GAUZE/BANDAGES/DRESSINGS) ×1 IMPLANT
BINDER BREAST XLRG (GAUZE/BANDAGES/DRESSINGS) IMPLANT
BLADE SURG 10 STRL SS (BLADE) ×2 IMPLANT
BLADE SURG 15 STRL LF DISP TIS (BLADE) ×1 IMPLANT
BLADE SURG 15 STRL SS (BLADE) ×2
CANISTER SUCTION 2500CC (MISCELLANEOUS) ×2 IMPLANT
CHLORAPREP W/TINT 26ML (MISCELLANEOUS) ×2 IMPLANT
CLIP APPLIE 9.375 MED OPEN (MISCELLANEOUS) ×1 IMPLANT
CLOTH BEACON ORANGE TIMEOUT ST (SAFETY) ×2 IMPLANT
CONT SPEC 4OZ CLIKSEAL STRL BL (MISCELLANEOUS) ×3 IMPLANT
CONT SPEC STER OR (MISCELLANEOUS) ×2 IMPLANT
COVER PROBE W GEL 5X96 (DRAPES) ×2 IMPLANT
COVER SURGICAL LIGHT HANDLE (MISCELLANEOUS) ×2 IMPLANT
DECANTER SPIKE VIAL GLASS SM (MISCELLANEOUS) ×2 IMPLANT
DERMABOND ADVANCED (GAUZE/BANDAGES/DRESSINGS) ×1
DERMABOND ADVANCED .7 DNX12 (GAUZE/BANDAGES/DRESSINGS) IMPLANT
DEVICE DUBIN SPECIMEN MAMMOGRA (MISCELLANEOUS) ×2 IMPLANT
DRAPE CHEST BREAST 15X10 FENES (DRAPES) ×2 IMPLANT
ELECT CAUTERY BLADE 6.4 (BLADE) ×2 IMPLANT
ELECT REM PT RETURN 9FT ADLT (ELECTROSURGICAL) ×2
ELECTRODE REM PT RTRN 9FT ADLT (ELECTROSURGICAL) ×1 IMPLANT
GLOVE BIO SURGEON STRL SZ7 (GLOVE) ×2 IMPLANT
GLOVE BIOGEL PI IND STRL 7.5 (GLOVE) ×1 IMPLANT
GLOVE BIOGEL PI INDICATOR 7.5 (GLOVE) ×1
GOWN STRL NON-REIN LRG LVL3 (GOWN DISPOSABLE) ×4 IMPLANT
KIT BASIN OR (CUSTOM PROCEDURE TRAY) ×2 IMPLANT
KIT MARKER MARGIN INK (KITS) ×1 IMPLANT
KIT ROOM TURNOVER OR (KITS) ×2 IMPLANT
NDL 18GX1X1/2 (RX/OR ONLY) (NEEDLE) ×1 IMPLANT
NDL HYPO 25GX1X1/2 BEV (NEEDLE) ×2 IMPLANT
NEEDLE 18GX1X1/2 (RX/OR ONLY) (NEEDLE) IMPLANT
NEEDLE HYPO 25GX1X1/2 BEV (NEEDLE) ×2 IMPLANT
NS IRRIG 1000ML POUR BTL (IV SOLUTION) ×2 IMPLANT
PACK SURGICAL SETUP 50X90 (CUSTOM PROCEDURE TRAY) ×2 IMPLANT
PAD ARMBOARD 7.5X6 YLW CONV (MISCELLANEOUS) ×2 IMPLANT
PENCIL BUTTON HOLSTER BLD 10FT (ELECTRODE) ×2 IMPLANT
SPONGE GAUZE 4X4 12PLY (GAUZE/BANDAGES/DRESSINGS) IMPLANT
SPONGE LAP 18X18 X RAY DECT (DISPOSABLE) ×2 IMPLANT
STAPLER VISISTAT 35W (STAPLE) ×2 IMPLANT
STOCKINETTE IMPERVIOUS 9X36 MD (GAUZE/BANDAGES/DRESSINGS) IMPLANT
STRIP CLOSURE SKIN 1/2X4 (GAUZE/BANDAGES/DRESSINGS) ×1 IMPLANT
SUT MNCRL AB 4-0 PS2 18 (SUTURE) ×3 IMPLANT
SUT SILK 2 0 SH (SUTURE) ×1 IMPLANT
SUT VIC AB 2-0 SH 27 (SUTURE) ×6
SUT VIC AB 2-0 SH 27XBRD (SUTURE) ×2 IMPLANT
SUT VIC AB 3-0 SH 27 (SUTURE) ×8
SUT VIC AB 3-0 SH 27X BRD (SUTURE) IMPLANT
SUT VIC AB 3-0 SH 27XBRD (SUTURE) ×2 IMPLANT
SYR BULB IRRIGATION 50ML (SYRINGE) ×1 IMPLANT
SYR CONTROL 10ML LL (SYRINGE) ×3 IMPLANT
TOWEL OR 17X24 6PK STRL BLUE (TOWEL DISPOSABLE) ×2 IMPLANT
TOWEL OR 17X26 10 PK STRL BLUE (TOWEL DISPOSABLE) ×2 IMPLANT
TUBE CONNECTING 12X1/4 (SUCTIONS) ×2 IMPLANT
YANKAUER SUCT BULB TIP NO VENT (SUCTIONS) ×2 IMPLANT

## 2013-01-08 NOTE — Progress Notes (Signed)
Pt assisted to BR for first time post op; pt c/o generalized weakness; no dizziness or pain complaints at this time; pt assisted back to bed to sit on side of bed and eat at this time; will cont. To monitor; family in room.

## 2013-01-08 NOTE — Care Management Note (Unsigned)
    Page 1 of 1   01/08/2013     3:41:06 PM   CARE MANAGEMENT NOTE 01/08/2013  Patient:  Marisa Forbes, Marisa Forbes   Account Number:  [id number]  Date Initiated:  01/08/2013  Documentation initiated by:  AMERSON,JULIE  Subjective/Objective Assessment:   PT WITH BREAST CA ADM ON 01/06/13 FOR PLAVIX WASHOUT/INTEGRELLIN DRIP PRIOR TO BREAST SURGERY.  PTA, PT INDEPENDENT, LIVES WITH SPOUSE.     Action/Plan:   WILL FOLLOW FOR HOME NEEDS AS PT PROGRESSES.   Anticipated DC Date:  01/09/2013   Anticipated DC Plan:  HOME/SELF CARE      DC Planning Services  CM consult      Choice offered to / List presented to:             Status of service:  In process, will continue to follow Medicare Important Message given?   (If response is "NO", the following Medicare IM given date fields will be blank) Date Medicare IM given:   Date Additional Medicare IM given:    Discharge Disposition:    Per UR Regulation:  Reviewed for med. necessity/level of care/duration of stay  If discussed at Long Length of Stay Meetings, dates discussed:    Comments:

## 2013-01-08 NOTE — Progress Notes (Signed)
Pt to be transported by husband to cancer center for wire lumpectomy; per conversation with pt, pt husband, and night shift RN; RN CNO approved pt to be transported by husband with IV SL in place and to send IV ABX with pt; tele monitor d/c prior to pt's leaving unit; pt escorted to personal vehicle in wheelchair; will cont. To monitor.

## 2013-01-08 NOTE — Anesthesia Preprocedure Evaluation (Addendum)
Anesthesia Evaluation  Patient identified by MRN, date of birth, ID band Patient awake    Reviewed: Allergy & Precautions, H&P , NPO status , Patient's Chart, lab work & pertinent test results  Airway Mallampati: II  Neck ROM: full    Dental  (+) Dental Advisory Given   Pulmonary          Cardiovascular hypertension, Pt. on medications and Pt. on home beta blockers + CAD, + Past MI, + Cardiac Stents and + Peripheral Vascular Disease     Neuro/Psych CVA    GI/Hepatic   Endo/Other  diabetes, Type 2  Renal/GU      Musculoskeletal   Abdominal   Peds  Hematology   Anesthesia Other Findings   Reproductive/Obstetrics                          Anesthesia Physical Anesthesia Plan  ASA: III  Anesthesia Plan: General   Post-op Pain Management:    Induction: Intravenous  Airway Management Planned: LMA  Additional Equipment:   Intra-op Plan:   Post-operative Plan:   Informed Consent: I have reviewed the patients History and Physical, chart, labs and discussed the procedure including the risks, benefits and alternatives for the proposed anesthesia with the patient or authorized representative who has indicated his/her understanding and acceptance.     Plan Discussed with: CRNA and Surgeon  Anesthesia Plan Comments:         Anesthesia Quick Evaluation

## 2013-01-08 NOTE — Progress Notes (Signed)
ANTICOAGULATION CONSULT NOTE - Follow Up Consult  Pharmacy Consult for Integrelin Indication: antiplatelet while off Brilinta  No Known Allergies  Patient Measurements: Height: 5\' 2"  (157.5 cm) Weight: 146 lb (66.225 kg) IBW/kg (Calculated) : 50.1 Heparin Dosing Weight:    Vital Signs: Temp: 96 F (35.6 C) (03/12 1450) Temp src: Oral (03/12 0434) BP: 178/91 mmHg (03/12 1535) Pulse Rate: 63 (03/12 1545)  Labs:  Recent Labs  01/06/13 1658 01/06/13 2205 01/07/13 0555  HGB 13.5 13.8 14.3  HCT 38.8 39.6 Marisa.3  PLT 227 216 230  LABPROT  --  13.2  --   INR  --  1.01  --   CREATININE 0.55 0.60  --     Estimated Creatinine Clearance: 65 ml/min (by C-G formula based on Cr of 0.6).   Assessment: Marisa Forbes with hx of STEMI (11/13), s/p DES placement on Brilinta. Brilinta was stopped on 3/7 for lumpectomy 3/12. Pharmacy is consulted for Integrilin for antiplatelet bridge therapy.   Anticoag: CBC and platelets stable today. Integrelin off at [facility] last night to be resumed 6 hrs post-procedure 3/12.  Cards: HTN, MI: ASA 81mg , Lipitor, Imdur, lisinopril, metoprolol, Ranexa  Endo: DM: 66-170 on SSI and lantus  Neuro: h/o CVA  Heme/Onc: New high grade ductal carcinoma in situ with necrosis  Goal of Therapy:  Appropriate Integrilin dosing adjusted for renal function Monitor platelets by anticoagulation protocol: Yes   Goal of Therapy:  Antiplatelet effect Monitor platelets by anticoagulation protocol: Yes   Plan:  - Integrilin Infusion 24mcg/kg/min = 132.57mcg/min = 10.81ml/hr - Resume Integrelin 6 hrs after procedure approx 2130   Crystal S. Merilynn Finland, PharmD, BCPS Clinical Staff Pharmacist Pager [phone]  Misty Stanley Stillinger 01/08/2013,3:47 PM

## 2013-01-08 NOTE — Progress Notes (Signed)
Pt to OR at this time.

## 2013-01-08 NOTE — Anesthesia Postprocedure Evaluation (Signed)
  Anesthesia Post-op Note  Patient: Marisa Forbes  Procedure(s) Performed: Procedure(s): LEFT BREAST WIRE GUIDED  LUMPECTOMY AND LEFT AXILLARY SENTINEL  NODE BX (Left)  Patient Location: PACU  Anesthesia Type:General  Level of Consciousness: awake, oriented, sedated and patient cooperative  Airway and Oxygen Therapy: Patient Spontanous Breathing  Post-op Pain: mild  Post-op Assessment: Post-op Vital signs reviewed, Patient's Cardiovascular Status Stable, Respiratory Function Stable, Patent Airway, No signs of Nausea or vomiting and Pain level controlled  Post-op Vital Signs: stable  Complications: No apparent anesthesia complications

## 2013-01-08 NOTE — Progress Notes (Signed)
Pt back from OR; BS 62 at this time; pt awake; pt given orange juice to drink; family at bedside; pt denies pain; will cont. To monitor.

## 2013-01-08 NOTE — Anesthesia Procedure Notes (Signed)
Procedure Name: LMA Insertion Date/Time: 01/08/2013 1:31 PM Performed by: Elizbeth Squires R Pre-anesthesia Checklist: Patient identified, Emergency Drugs available, Patient being monitored and Suction available Patient Re-evaluated:Patient Re-evaluated prior to inductionOxygen Delivery Method: Circle system utilized Preoxygenation: Pre-oxygenation with 100% oxygen Ventilation: Mask ventilation without difficulty LMA: LMA inserted LMA Size: 4.0 Number of attempts: 1 Placement Confirmation: positive ETCO2 and breath sounds checked- equal and bilateral Tube secured with: Tape Dental Injury: Teeth and Oropharynx as per pre-operative assessment

## 2013-01-08 NOTE — Discharge Instructions (Signed)
[facility] °Office Phone Number [phone] ° °BREAST BIOPSY/ PARTIAL MASTECTOMY: POST OP INSTRUCTIONS ° °Always review your discharge instruction sheet given to you by the facility where your surgery was performed. ° °IF YOU HAVE DISABILITY OR FAMILY LEAVE FORMS, YOU MUST BRING THEM TO THE OFFICE FOR PROCESSING.  DO NOT GIVE THEM TO YOUR DOCTOR. ° °1. A prescription for pain medication may be given to you upon discharge.  Take your pain medication as prescribed, if needed.  If narcotic pain medicine is not needed, then you may take acetaminophen (Tylenol), naprosyn (Alleve) or ibuprofen (Advil) as needed. °2. Take your usually prescribed medications unless otherwise directed °3. If you need a refill on your pain medication, please contact your pharmacy.  They will contact our office to request authorization.  Prescriptions will not be filled after 5pm or on week-ends. °4. You should eat very light the first 24 hours after surgery, such as soup, crackers, pudding, etc.  Resume your normal diet the day after surgery. °5. Most patients will experience some swelling and bruising in the breast.  Ice packs and a good support bra will help.  Wear the breast binder provided or a sports bra for 72 hours day and night.  After that wear a sports bra during the day until you return to the office. Swelling and bruising can take several days to resolve.  °6. It is common to experience some constipation if taking pain medication after surgery.  Increasing fluid intake and taking a stool softener will usually help or prevent this problem from occurring.  A mild laxative (Milk of Magnesia or Miralax) should be taken according to package directions if there are no bowel movements after 48 hours. °7. Unless discharge instructions indicate otherwise, you may remove your bandages 48 hours after surgery and you may shower at that time.  You may have steri-strips (small skin tapes) in place directly over the incision.   These strips should be left on the skin for 7-10 days and will come off on their own.  If your surgeon used skin glue on the incision, you may shower in 24 hours.  The glue will flake off over the next 2-3 weeks.  Any sutures or staples will be removed at the office during your follow-up visit. °8. ACTIVITIES:  You may resume regular daily activities (gradually increasing) beginning the next day.  Wearing a good support bra or sports bra minimizes pain and swelling.  You may have sexual intercourse when it is comfortable. °a. You may drive when you no longer are taking prescription pain medication, you can comfortably wear a seatbelt, and you can safely maneuver your car and apply brakes. °b. RETURN TO WORK:  ______________________________________________________________________________________ °9. You should see your doctor in the office for a follow-up appointment approximately two weeks after your surgery.  Your doctor’s nurse will typically make your follow-up appointment when she calls you with your pathology report.  Expect your pathology report 3-4 business days after your surgery.  You may call to check if you do not hear from us after three days. °10. OTHER INSTRUCTIONS: _______________________________________________________________________________________________ _____________________________________________________________________________________________________________________________________ °_____________________________________________________________________________________________________________________________________ °_____________________________________________________________________________________________________________________________________ ° °WHEN TO CALL DR Martinez Boxx: °1. Fever over 101.0 °2. Nausea and/or vomiting. °3. Extreme swelling or bruising. °4. Continued bleeding from incision. °5. Increased pain, redness, or drainage from the incision. ° °The clinic staff is available to  answer your questions during regular business hours.  Please don’t hesitate to call and ask to speak to one of the nurses for   clinical concerns.  If you have a medical emergency, go to the nearest emergency room or call 911.  A surgeon from [facility] is always on call at the hospital. ° °For further questions, please visit [email or web address] ° °

## 2013-01-08 NOTE — Progress Notes (Signed)
Oral airway removed. Pt tolerated well.

## 2013-01-08 NOTE — Progress Notes (Signed)
Pt back from cancer center at this time; OR called; pt to be transported to OR for wire lumpectomy at this time.

## 2013-01-08 NOTE — Op Note (Signed)
Preoperative diagnosis: Clinical stage I left breast cancer Postoperative diagnsosis: saa Procedure: Left breast wire guided lumpectomy, left axillary sentinel node biopsy Surgeon: Dr Harden Mo Anesthesia: General Complications: none EBL: minimal Drains: none Specimens: 1. Left breast tissue marked with paint 2. Additional left breast medial margin, short superior, long lateral, double deep 3. Left axillary sentinel node x2, count 5175 and 169 Sponge and needle count correct at end Disposition to recovery stable  Indications: This is a 62 year old female with a clinical stage I left breast cancer. She was seen in the multidisciplinary clinic. Following this we decided to attempt breast conservation therapy with a sentinel lymph node biopsy. She had a recent myocardial infarction in the last several months and has been on antiplatelet therapy. She had a drug-eluting stent placed. In conjunction with cardiology we have decided to discontinue her antiplatelet therapy but we brought her into the hospital prior to surgery and placed her on Integrilin. Surgery then could be done through a window. We discussed the risks and benefits of surgery specifically the need to return to the operating room and the decision for any other adjuvant therapy based on the results of this operation.  Procedure: After informed consent was obtained the patient was first taken at the breast center where she had a wire placed. I had these mammograms available. Her Integrilin had been turned off the night prior to the procedure. She was administered 2 g of intravenous cefazolin. She had sequential compression devices placed on her legs. She was placed under general endotracheal anesthesia without complication. Her left breast and axilla were then prepped and draped in the standard sterile surgical fashion. Surgical timeout was then performed.  I made a curvilinear incision overlying the wire and the lesion in the left  lower outer quadrant. I then used cautery to remove the wire and the surrounding tissue which included the cancer. I passed this off the table as a specimen. Faxitron mammogram confirmed removal of the clip lesion and wire. I did take a little bit of an additional medial margin in an effort not to have to bring her back to the operating room as this  appeared to be the closest on mammography. I then obtained hemostasis. Irrigation was performed. I placed 2 clips deep and one clip in each position in the cavity. I closed the cavity with 2-0 Vicryl. I then closed the dermis with 3-0 Vicryl and skin with 4-0 Monocryl.  I then made a 2 cm incision her left axilla. I carried this through the axillary fascia. I used the neoprobe to identify 2 hot sentinel nodes with the counts as listed as above. These were both excised and the background radioactivity was essentially 0. Hemostasis was then obtained. I then closed the axillary fashion with 2-0 Vicryl. The dermis was closed with 3-0 Vicryl and the skin for Monocryl. I infiltrated Marcaine into both incisions. I then placed Dermabond and Steri-Strips. A breast binder was placed. She tolerated this well was extubated and transferred to recovery stable. I will plan on beginning her Integrilin in 6 hours.

## 2013-01-08 NOTE — Interval H&P Note (Signed)
History and Physical Interval Note:  01/08/2013 1:20 PM  Marisa Forbes  has presented today for surgery, with the diagnosis of left breast cancer  The various methods of treatment have been discussed with the patient and family. After consideration of risks, benefits and other options for treatment, the patient has consented to  Procedure(s): LEFT BREAST WIRE GUIDED  LUMPECTOMY AND LEFT AXILLARY SENTINEL  NODE BX (Left) as a surgical intervention .  The patient's history has been reviewed, patient examined, no change in status, stable for surgery.  I have reviewed the patient's chart and labs.  Questions were answered to the patient's satisfaction.     WAKEFIELD,MATTHEW

## 2013-01-08 NOTE — Transfer of Care (Signed)
Immediate Anesthesia Transfer of Care Note  Patient: Marisa Forbes  Procedure(s) Performed: Procedure(s): LEFT BREAST WIRE GUIDED  LUMPECTOMY AND LEFT AXILLARY SENTINEL  NODE BX (Left)  Patient Location: PACU  Anesthesia Type:General  Level of Consciousness: sedated  Airway & Oxygen Therapy: Patient Spontanous Breathing and Patient connected to nasal cannula oxygen  Post-op Assessment: Report given to PACU RN and Post -op Vital signs reviewed and stable  Post vital signs: Reviewed and stable  Complications: No apparent anesthesia complications

## 2013-01-09 ENCOUNTER — Telehealth (INDEPENDENT_AMBULATORY_CARE_PROVIDER_SITE_OTHER): Payer: Self-pay | Admitting: General Surgery

## 2013-01-09 LAB — BASIC METABOLIC PANEL
BUN: 11 mg/dL (ref 6–23)
CO2: 24 mEq/L (ref 19–32)
Calcium: 9.7 mg/dL (ref 8.4–10.5)
Chloride: 106 mEq/L (ref 96–112)
Creatinine, Ser: 0.55 mg/dL (ref 0.50–1.10)
GFR calc Af Amer: >90 mL/min (ref >90)
GFR calc non Af Amer: >90 mL/min (ref >90)
Glucose, Bld: 56 mg/dL — ABNORMAL LOW (ref 70–99)
Potassium: 3.7 mEq/L (ref 3.5–5.1)
Sodium: 140 mEq/L (ref 135–145)

## 2013-01-09 LAB — CBC
HCT: 42 % (ref 36.0–46.0)
Hemoglobin: 14.5 g/dL (ref 12.0–15.0)
MCH: 29.5 pg (ref 26.0–34.0)
MCHC: 34.5 g/dL (ref 30.0–36.0)
MCV: 85.5 fL (ref 78.0–100.0)
Platelets: 258 10*3/uL (ref 150–400)
RBC: 4.91 MIL/uL (ref 3.87–5.11)
RDW: 12.9 % (ref 11.5–15.5)
WBC: 10.2 10*3/uL (ref 4.0–10.5)

## 2013-01-09 LAB — GLUCOSE, CAPILLARY: Glucose-Capillary: 81 mg/dL (ref 70–99)

## 2013-01-09 MED ORDER — OXYCODONE-ACETAMINOPHEN 5-325 MG PO TABS: 1.0000 | ORAL_TABLET | ORAL | Status: DC | PRN

## 2013-01-09 NOTE — Progress Notes (Signed)
Subjective: No complaints  Objective: Vital signs in last 24 hours: Temp:  [96 F (35.6 C)-98.3 F (36.8 C)] 97.8 F (36.6 C) (03/13 0615) Pulse Rate:  [59-97] 66 (03/13 0615) Resp:  [12-18] 18 (03/13 0615) BP: (120-181)/(78-102) 143/87 mmHg (03/13 0615) SpO2:  [95 %-100 %] 97 % (03/13 0615) Weight change:  Last BM Date: 01/05/13 Intake/Output from previous day: 03/12 0701 - 03/13 0700 In: 1845 [P.O.:410; I.V.:1435] Out: 2 [Urine:2] Intake/Output this shift: Total I/O In: 240 [P.O.:240] Out: -   PE: General:alert and oriented Heart:S1S2 RRR Lungs:clear Abd:+ BS, soft, non tender Ext:no edeam    Lab Results:  Recent Labs  01/07/13 0555 01/09/13 0950  WBC 4.9 10.2  HGB 14.3 14.5  HCT 41.3 42.0  PLT 230 258   BMET  Recent Labs  01/06/13 2205 01/09/13 0950  NA 139 140  K 3.4* 3.7  CL 105 106  CO2 24 24  GLUCOSE 184* 56*  BUN 14 11  CREATININE 0.60 0.55  CALCIUM 9.8 9.7   No results found for this basename: TROPONINI, CK, MB,  in the last 72 hours  Lab Results  Component Value Date   CHOL 216* 09/08/2012   HDL 60 09/08/2012   LDLCALC 142* 09/08/2012   TRIG 68 09/08/2012   CHOLHDL 3.6 09/08/2012   Lab Results  Component Value Date   HGBA1C 13.6* 09/07/2012     Lab Results  Component Value Date   TSH 1.315 01/06/2013    Hepatic Function Panel  Recent Labs  01/06/13 2205  PROT 6.7  ALBUMIN 3.6  AST 16  ALT 17  ALKPHOS 67  BILITOT 0.2*  Studies/Results: Nm Sentinel Node Inj-no Rpt (breast)  01/08/2013  CLINICAL DATA: left axillary sentinel node biopsy   Sulfur colloid was injected intradermally by the nuclear medicine  technologist for breast cancer sentinel node localization.     [employer] [employer] Breast Loc Dev   1st Lesion  Inc US Guide  01/08/2013  *RADIOLOGY REPORT*  Clinical Data:  Known left breast carcinoma.  Preoperative localization.  NEEDLE LOCALIZATION USING ULTRASOUND GUIDANCE AND SPECIMEN RADIOGRAPH  Comparison: Previous exams.   Patient presents for needle localization prior to needle localization of the left breast mass.  I met with the patient and we discussed the procedure of needle localization including benefits and alternatives. We discussed the high likelihood of a successful procedure. We discussed the risks of the procedure, including infection, bleeding, tissue injury, and further surgery. Informed, written consent was given.  Using ultrasound guidance, sterile technique, 2% lidocaine and a 7 cm modified Kopans needle, the mass with clip was localized using a lateral approach.  The films are marked for Dr. Dwain Sarna.  Specimen radiograph is performed at main OR, and confirms the mass, intact wire, and clip to be present in the tissue sample.  The specimen is marked for pathology.  IMPRESSION: Needle localization of the left breast.  No apparent complications.   Original Report Authenticated By: Rolla Plate, M.D.     Medications: I have reviewed her medications. Marland Kitchen aspirin  81 mg Oral Daily  . atorvastatin  80 mg Oral q1800  . insulin aspart  0-9 Units Subcutaneous TID WC  . insulin aspart  10 Units Subcutaneous TID WC  . insulin glargine  31 Units Subcutaneous QHS  . isosorbide mononitrate  60 mg Oral Daily  . lisinopril  40 mg Oral Daily  . metoprolol tartrate  100 mg Oral BID  . midazolam  2 mg Intravenous  Once  . ranolazine  500 mg Oral BID  . Ticagrelor  90 mg Oral BID   Assessment/Plan: Active Problems:   Essential hypertension, benign   Diabetes type 2, uncontrolled   Dyslipidemia   CAD, urgent LAD DES 09/07/12, residual RCA and Dx disease, EF 45-50%   Cancer of upper-inner quadrant of female breast  PLAN: Brilinta resumed.  90 mg BID.  Integrillin d/c'd.  OK for discharge will make appt with Dr. Tresa Endo     LOS: 3 days   Time spent with pt. 15 minutes. INGOLD,LAURA R 01/09/2013, 11:29 AM    I have seen and examined the patient along with [facility] R, NP.  I have reviewed the chart,  notes and new data.  I agree with NP's note.  Key new complaints: no bleeding at surgical sites. No angina. Key examination changes: healthy surgical wounds  PLAN: Ready for DC today. Will arrange f/u with Dr. Tresa Endo.  Thurmon Fair, MD, [facility] [phone] 01/09/2013, 11:52 AM

## 2013-01-09 NOTE — Telephone Encounter (Signed)
Spoke with patient she has appt for 01/23/13 @11 :45

## 2013-01-09 NOTE — Progress Notes (Signed)
1 Day Post-Op  Subjective: Doing well, up and to bathroom, pain controlled  Objective: Vital signs in last 24 hours: Temp:  [96 F (35.6 C)-98.3 F (36.8 C)] 97.8 F (36.6 C) (03/13 0615) Pulse Rate:  [59-97] 66 (03/13 0615) Resp:  [12-18] 18 (03/13 0615) BP: (120-181)/(78-102) 131/90 mmHg (03/12 2100) SpO2:  [95 %-100 %] 97 % (03/13 0615) Last BM Date: 01/05/13  Intake/Output from previous day: 03/12 0701 - 03/13 0700 In: 1845 [P.O.:410; I.V.:1435] Out: 2 [Urine:2] Intake/Output this shift:    No distress Left breast incision clean without infection, no hematoma Left axillary incision without hematoma  Lab Results:   Recent Labs  01/06/13 2205 01/07/13 0555  WBC 6.2 4.9  HGB 13.8 14.3  HCT 39.6 41.3  PLT 216 230   BMET  Recent Labs  01/06/13 1658 01/06/13 2205  NA 141 139  K 3.6 3.4*  CL 108 105  CO2 25 24  GLUCOSE 149* 184*  BUN 15 14  CREATININE 0.55 0.60  CALCIUM 9.8 9.8   PT/INR  Recent Labs  01/06/13 2205  LABPROT 13.2  INR 1.01   ABG No results found for this basename: PHART, PCO2, PO2, HCO3,  in the last 72 hours  Studies/Results: Nm Sentinel Node Inj-no Rpt (breast)  01/08/2013  CLINICAL DATA: left axillary sentinel node biopsy   Sulfur colloid was injected intradermally by the nuclear medicine  technologist for breast cancer sentinel node localization.     [place] [employer] Breast Loc Dev   1st Lesion  Inc US Guide  01/08/2013  *RADIOLOGY REPORT*  Clinical Data:  Known left breast carcinoma.  Preoperative localization.  NEEDLE LOCALIZATION USING ULTRASOUND GUIDANCE AND SPECIMEN RADIOGRAPH  Comparison: Previous exams.  Patient presents for needle localization prior to needle localization of the left breast mass.  I met with the patient and we discussed the procedure of needle localization including benefits and alternatives. We discussed the high likelihood of a successful procedure. We discussed the risks of the procedure, including infection,  bleeding, tissue injury, and further surgery. Informed, written consent was given.  Using ultrasound guidance, sterile technique, 2% lidocaine and a 7 cm modified Kopans needle, the mass with clip was localized using a lateral approach.  The films are marked for Dr. Dwain Sarna.  Specimen radiograph is performed at main OR, and confirms the mass, intact wire, and clip to be present in the tissue sample.  The specimen is marked for pathology.  IMPRESSION: Needle localization of the left breast.  No apparent complications.   Original Report Authenticated By: Rolla Plate, M.D.     Anti-infectives: Anti-infectives   Start     Dose/Rate Route Frequency Ordered Stop   01/08/13 0000  ceFAZolin (ANCEF) IVPB 2 g/50 mL premix  Status:  Discontinued    Comments:  This is preop medication and should be given tomorrow prior to surgery   2 g 100 mL/hr over 30 Minutes Intravenous  Once 01/07/13 0723 01/07/13 0724   01/07/13 0600  ceFAZolin (ANCEF) IVPB 2 g/50 mL premix  Status:  Discontinued     2 g 100 mL/hr over 30 Minutes Intravenous On call to O.R. 01/06/13 1552 01/08/13 1601      Assessment/Plan: Pod 1 lump/sn, left Will change back to po dual antiplatelet therapy this am, dc integrilin, can dc home later this am if ok with cardiology also   [facility] 01/09/2013

## 2013-01-11 ENCOUNTER — Encounter (HOSPITAL_COMMUNITY): Payer: Self-pay | Admitting: General Surgery

## 2013-01-11 ENCOUNTER — Telehealth: Payer: Self-pay | Admitting: Oncology

## 2013-01-11 NOTE — Telephone Encounter (Signed)
3/19 appt moved to AM due to KK PM [facility]. S/w pt she is aware.  °

## 2013-01-13 NOTE — Discharge Summary (Signed)
Physician Discharge Summary  Patient ID: Marisa Forbes MRN: [id number] DOB/AGE: 62-Nov-1952 62 y.o.  Admit date: 01/06/2013 Discharge date: 01/13/2013  Admission Diagnoses: CAD Diabetes mellitus Left breast cancer  Discharge Diagnoses:  Active Problems:   Essential hypertension, benign   Diabetes type 2, uncontrolled   Dyslipidemia   CAD, urgent LAD DES 09/07/12, residual RCA and Dx disease, EF 45-50%   Cancer of upper-inner quadrant of female breast   Discharged Condition: good  Hospital Course: 33 yof who has newly diagnosed left breast cancer that is triple negative.  We discussed left breast lumpectomy, left axillary sentinel node biopsy.  She recently had an MI with drug eluting stent and needs to be on dual antiplatelet therapy.  In discussion with Dr. Tresa Endo, her cardiologist, we decided to come off brilinta and admit two days prior to place on integrilin.  This was done. The day of surgery she presented to breast center and had her wire placed.  She underwent left breast wire guided lumpectomy with sentinel node biopsy without complication.  She was placed back on intergrilin and did well overnight without hematoma. She was discharged home the following day back on antiplatelet therapy.  Consults: cardiology  Significant Diagnostic Studies: none  Treatments:left breast wire guided lumpectomy , left axillary sentinel node biopsy    Disposition: 01-Home or Self Care   Future Appointments Provider Department Dept Phone   01/15/2013 11:30 AM Victorino December, MD [facility] MEDICAL ONCOLOGY [phone]   01/23/2013 11:45 AM Emelia Loron, MD [facility], [place] [phone]   02/21/2013 11:00 AM Pricilla Riffle, MD [facility] Main Office [place]) [phone]       Medication List    TAKE these medications       aspirin 81 MG EC tablet  Take 1 tablet (81 mg total) by mouth daily.     atorvastatin 80 MG tablet  Commonly known as:  LIPITOR   Take 1 tablet (80 mg total) by mouth daily at 6 PM.     BRILINTA 90 MG Tabs tablet  Generic drug:  Ticagrelor  Take 1 tablet (90 mg total) by mouth 2 (two) times daily.     diazepam 5 MG tablet  Commonly known as:  VALIUM  Take 1 tablet (5 mg total) by mouth every 6 (six) hours as needed for anxiety.     glucose blood test strip  Commonly known as:  ONETOUCH VERIO  Use TID     ibuprofen 200 MG tablet  Commonly known as:  ADVIL,MOTRIN  Take 200 mg by mouth every 6 (six) hours as needed. For pain     insulin aspart 100 UNIT/ML injection  Commonly known as:  novoLOG  Inject 10 Units into the skin 3 (three) times daily with meals.     insulin glargine 100 UNIT/ML injection  Commonly known as:  LANTUS SOLOSTAR  Inject 31 Units into the skin at bedtime.     isosorbide mononitrate 60 MG 24 hr tablet  Commonly known as:  IMDUR  Take 1 tablet (60 mg total) by mouth daily.     lisinopril 40 MG tablet  Commonly known as:  PRINIVIL,ZESTRIL  Take 40 mg by mouth daily.     metFORMIN 750 MG 24 hr tablet  Commonly known as:  GLUCOPHAGE-XR  Take 1 tablet (750 mg total) by mouth daily with breakfast.     metoprolol 50 MG tablet  Commonly known as:  LOPRESSOR  Take 125 mg by mouth 2 (  two) times daily.     nitroGLYCERIN 0.4 MG SL tablet  Commonly known as:  NITROSTAT  Place 1 tablet (0.4 mg total) under the tongue every 5 (five) minutes x 3 doses as needed for chest pain.     oxyCODONE-acetaminophen 5-325 MG per tablet  Commonly known as:  ROXICET  Take 1 tablet by mouth every 4 (four) hours as needed for pain.     ranolazine 500 MG 12 hr tablet  Commonly known as:  RANEXA  Take 1 tablet (500 mg total) by mouth 2 (two) times daily.           Follow-up Information   Follow up with [facility], MD In 2 weeks.   Contact information:   [address] [place] [address] [phone]       Follow up with Lennette Bihari, MD On 02/06/2013. (at 0900 am)     Contact information:   [address] [place] [address] [phone]       Signed: Emelia Loron 01/13/2013, 11:32 AM

## 2013-01-15 ENCOUNTER — Ambulatory Visit (HOSPITAL_BASED_OUTPATIENT_CLINIC_OR_DEPARTMENT_OTHER): Payer: BC Managed Care – PPO | Admitting: Oncology

## 2013-01-15 ENCOUNTER — Telehealth: Payer: Self-pay | Admitting: *Deleted

## 2013-01-15 VITALS — BP 173/94 | HR 66 | Temp 97.3°F | Resp 20 | Ht 62.0 in | Wt 143.3 lb

## 2013-01-15 DIAGNOSIS — C50912 Malignant neoplasm of unspecified site of left female breast: Secondary | ICD-10-CM

## 2013-01-15 DIAGNOSIS — Z171 Estrogen receptor negative status [ER-]: Secondary | ICD-10-CM

## 2013-01-15 DIAGNOSIS — C50919 Malignant neoplasm of unspecified site of unspecified female breast: Secondary | ICD-10-CM

## 2013-01-15 NOTE — Telephone Encounter (Signed)
appts made and printed 

## 2013-01-15 NOTE — Patient Instructions (Addendum)
We discussed your pathology today  Discussed role of adjuvant chemotherapy in triple negative breast cancer  We discussed [facility] chemotherapy every 21 days for total of 6 cycles  We need a port in order to give chemo safely intravenously

## 2013-01-16 ENCOUNTER — Telehealth: Payer: Self-pay | Admitting: Oncology

## 2013-01-16 NOTE — Telephone Encounter (Signed)
Added f/u w/KK for 4/8 and adjusted lb time. S/w pt and confirmed appts for 3/25 and 4/8.

## 2013-01-19 NOTE — Progress Notes (Signed)
OFFICE PROGRESS NOTE  CC  Marisa Linger, MD [address] [facility] [address] [place], [address] [place] [address] Dr. Emelia Loron  Dr. 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   STAGE:  Cancer of upper-inner quadrant of female breast  Primary site: Breast (Left)  Staging method: AJCC 7th Edition  Clinical: Stage IA (T1c, N0, cM0)  Summary: Stage IA (T1c, N0, cM0)    PRIOR THERAPY: #1in January 2014  patient was found to have a suspicious area on screening mammography in the upper outer quadrant of the left breast. She ultimately underwent biopsy of this area which revealed invasive ductal carcinoma. the tumor was estrogen and progesterone receptor negative as well as HER-2/neu negative MRI revealed a solitary mass measuring 1.6 x 1.5 x 1.5 cm in size. Patient was seen in the multidisciplinary breast clinic.  #2patient is status post left lumpectomy with sentinel lymph node biopsies. Her final pathology reveals a 1.4 cm invasive ductal carcinoma grade 3, ER negative PR negative HER-2/neu negative with Ki-67 54%. Sentinel lymph nodes were negative for metastatic disease.  #3 patient is recommended adjuvant chemotherapy consisting of CMFto be given every 3 weeks for a total of 6 cycles adjuvantly.I have recommended CMF since patient does have significant cardiac disease and recently had a myocardial infarction. She also has other neurologic disease as well.  CURRENT THERAPY: CMF every 3 weeks once patient has a Port-A-Cath placed.  INTERVAL HISTORY: Marisa Forbes 62 y.o. female returns for followup visit after her lumpectomy. Clinically she seems to be doing well. She still has some JP drains in. She is denying any nausea vomiting fevers chills night sweats headaches shortness of breath chest pains or palpitations. No myalgias and arthralgias.  MEDICAL  HISTORY: Past Medical History  Diagnosis Date  . HTN (hypertension)   . Diabetes mellitus without complication   . Stroke, embolic   . Myocardial infarction 09/07/12  . Breast cancer     ALLERGIES:  has No Known Allergies.  MEDICATIONS:  Current Outpatient Prescriptions  Medication Sig Dispense Refill  . aspirin EC 81 MG EC tablet Take 1 tablet (81 mg total) by mouth daily.      Marland Kitchen atorvastatin (LIPITOR) 80 MG tablet Take 1 tablet (80 mg total) by mouth daily at 6 PM.  30 tablet  5  . diazepam (VALIUM) 5 MG tablet Take 1 tablet (5 mg total) by mouth every 6 (six) hours as needed for anxiety.  60 tablet  1  . glucose blood (ONETOUCH VERIO) test strip Use TID  100 each  12  . ibuprofen (ADVIL,MOTRIN) 200 MG tablet Take 200 mg by mouth every 6 (six) hours as needed. For pain      . insulin aspart (NOVOLOG) 100 UNIT/ML injection Inject 10 Units into the skin 3 (three) times daily with meals.  1 vial  11  . insulin glargine (LANTUS SOLOSTAR) 100 UNIT/ML injection Inject 31 Units into the skin at bedtime.  9 mL  12  . isosorbide mononitrate (IMDUR) 60 MG 24 hr tablet Take 1 tablet (60 mg total) by mouth daily.  30 tablet  5  . lisinopril (PRINIVIL,ZESTRIL) 40 MG tablet Take 40 mg by mouth daily.      . metFORMIN (GLUCOPHAGE-XR) 750 MG 24 hr tablet Take 1 tablet (750 mg total) by mouth daily with breakfast.  30 tablet  5  . oxyCODONE-acetaminophen (  ROXICET) 5-325 MG per tablet Take 1 tablet by mouth every 4 (four) hours as needed for pain.  30 tablet  0  . ranolazine (RANEXA) 500 MG 12 hr tablet Take 1 tablet (500 mg total) by mouth 2 (two) times daily.  60 tablet  5  . Ticagrelor (BRILINTA) 90 MG TABS tablet Take 1 tablet (90 mg total) by mouth 2 (two) times daily.  60 tablet  11  . metoprolol (LOPRESSOR) 50 MG tablet Take 125 mg by mouth 2 (two) times daily.      . nitroGLYCERIN (NITROSTAT) 0.4 MG SL tablet Place 1 tablet (0.4 mg total) under the tongue every 5 (five) minutes x 3 doses as  needed for chest pain.  25 tablet  5   No current facility-administered medications for this visit.    SURGICAL HISTORY:  Past Surgical History  Procedure Laterality Date  . Back surgery    . Coronary angioplasty with stent placement    . Breast lumpectomy with needle localization and axillary sentinel lymph node bx Left 01/08/2013    Procedure: LEFT BREAST WIRE GUIDED  LUMPECTOMY AND LEFT AXILLARY SENTINEL  NODE BX;  Surgeon: Emelia Loron, MD;  Location: [facility] OR;  Service: General;  Laterality: Left;    REVIEW OF SYSTEMS:  Pertinent items are noted in HPI.   HEALTH MAINTENANCE:   PHYSICAL EXAMINATION: Blood pressure 173/94, pulse 66, temperature 97.3 F (36.3 C), temperature source Oral, resp. rate 20, height 5\' 2"  (1.575 m), weight 143 lb 4.8 oz (65 kg). Body mass index is 26.2 kg/(m^2). ECOG PERFORMANCE STATUS: 0 - Asymptomatic   General appearance: alert, cooperative and appears stated age Lymph nodes: Cervical, supraclavicular, and axillary nodes normal. Resp: clear to auscultation bilaterally Cardio: regular rate and rhythm GI: soft, non-tender; bowel sounds normal; no masses,  no organomegaly Extremities: extremities normal, atraumatic, no cyanosis or edema Neurologic: Grossly normal   LABORATORY DATA: Lab Results  Component Value Date   WBC 10.2 01/09/2013   HGB 14.5 01/09/2013   HCT 42.0 01/09/2013   MCV 85.5 01/09/2013   PLT 258 01/09/2013      Chemistry      Component Value Date/Time   NA 140 01/09/2013 0950   NA 142 12/11/2012 1229   K 3.7 01/09/2013 0950   K 4.3 12/11/2012 1229   CL 106 01/09/2013 0950   CL 106 12/11/2012 1229   CO2 24 01/09/2013 0950   CO2 28 12/11/2012 1229   BUN 11 01/09/2013 0950   BUN 12.7 12/11/2012 1229   CREATININE 0.55 01/09/2013 0950   CREATININE 0.7 12/11/2012 1229      Component Value Date/Time   CALCIUM 9.7 01/09/2013 0950   CALCIUM 9.9 12/11/2012 1229   ALKPHOS 67 01/06/2013 2205   ALKPHOS 65 12/11/2012 1229   AST 16 01/06/2013  2205   AST 17 12/11/2012 1229   ALT 17 01/06/2013 2205   ALT 26 12/11/2012 1229   BILITOT 0.2* 01/06/2013 2205   BILITOT 0.56 12/11/2012 1229    REASON FOR ADDENDUM, AMENDMENT OR CORRECTION: [id number]: Add TNM into synoptic template (part 1). ADDITIONAL INFORMATION: 1. PROGNOSTIC INDICATORS - ACIS Results: IMMUNOHISTOCHEMICAL AND MORPHOMETRIC ANALYSIS BY THE AUTOMATED CELLULAR IMAGING SYSTEM (ACIS) Estrogen Receptor: 0%, NEGATIVE Progesterone Receptor: 0%, NEGATIVE COMMENT: The negative hormone receptor study(ies) in this case have an internal positive control. REFERENCE RANGE ESTROGEN RECEPTOR NEGATIVE <1% POSITIVE =>1% PROGESTERONE RECEPTOR NEGATIVE <1% POSITIVE =>1% All controls stained appropriately Pecola Leisure MD Pathologist, Electronic Signature ( Signed 01/15/2013) 1.  CHROMOGENIC IN-SITU HYBRIDIZATION Results: HER-2/NEU BY CISH - NO AMPLIFICATION OF HER-2 DETECTED. 1 of 4 Amended copy Addendum FINAL for Marisa Forbes, Marisa Forbes [phone]) ADDITIONAL INFORMATION:(continued) RESULT RATIO OF HER2: CEP 17 SIGNALS 1.27 AVERAGE HER2 COPY NUMBER PER CELL 1.40 REFERENCE RANGE NEGATIVE HER2/Chr17 Ratio <2.0 and Average HER2 copy number <4.0 EQUIVOCAL HER2/Chr17 Ratio <2.0 and Average HER2 copy number 4.0 and <6.0 POSITIVE HER2/Chr17 Ratio >=2.0 and/or Average HER2 copy number >=6.0 Pecola Leisure MD Pathologist, Electronic Signature ( Signed 01/14/2013) FINAL DIAGNOSIS Diagnosis 1. Breast, lumpectomy, Left - INVASIVE DUCTAL CARCINOMA (1.4 CM), SEE COMMENT. - NO LYMPHOVASCULAR INVASION IDENTIFIED. - INVASIVE TUMOR IS 0.6 CM FROM THE NEAREST MARGIN (INFERIOR). - DUCTAL CARCINOMA IN SITU - SEE TUMOR SYNOPTIC TEMPLATE. 2. Lymph node, sentinel, biopsy, Left #1 - ONE LYMPH NODE, NEGATIVE FOR TUMOR (0/1). 3. Breast, excision, Left - BENIGN BREAST TISSUE, SEE COMMENT. - NEGATIVE FOR ATYPIA OR MALIGNANCY. - MICROCALCIFICATIONS IDENTIFIED. - SURGICAL MARGIN, NEGATIVE FOR  ATYPIA OR MALIGNANCY 4. Lymph node, sentinel, biopsy, Left #2 - ONE LYMPH NODE, NEGATIVE FOR TUMOR (0/1) Microscopic Comment 1. BREAST, INVASIVE TUMOR, WITH LYMPH NODE SAMPLING Specimen, including laterality: Left breast Procedure: Lumpectomy Grade: III of III Tubule formation: 3 Nuclear pleomorphism: 3 Mitotic: 2 Tumor size (gross measurement) 1.4 cm Margins: Invasive, distance to closest margin: 0.6 cm In-situ, distance to closest margin: 0.6 cm (inferior) If margin positive, focally or broadly: Forbes/A Lymphovascular invasion: Absent Ductal carcinoma in situ: Present Grade: II-III of III Extensive intraductal component: Absent 2 of 4 Amended copy Addendum FINAL for Marisa Forbes, Marisa Forbes [id number]) Microscopic Comment(continued) Lobular neoplasia: Absent Tumor focality: Unifocal Treatment effect: None If present, treatment effect in breast tissue, lymph nodes or both: Forbes/A Extent of tumor: Skin: Forbes/A Nipple: Forbes/A Skeletal muscle: Forbes/A Lymph nodes: # examined: 2 (parts 2 and 4) Lymph nodes with metastasis: 0 Breast prognostic profile: Estrogen receptor: Repeated, previous study demonstrated 0% positivity ([id number]) Progesterone receptor: Repeated, previous study demonstrated 0% positivity ([id number]) Her 2 neu: Repeated, previous case demonstrated no amplification ([id number]). Ki-67: Not repeated, previous study demonstrated 54% proliferation rate ([id number]) Non-neoplastic breast: Previous biopsy site change, fibrocystic change, sclerosing adenosis and microcalcifications in benign ducts and lobules. (CR:kh 01-10-13) ADDENDUM: Pathologic Stage: pT1c, pN0, pMX. (CRR:eps 01/15/13) 3. Within the additional tissue, there are benign findings to include usual ductal hyperplasia, sclerosing adenosis and microcalcifications in benign ducts and lobules. Preservation of the myoepithelial layer was demonstrated with smooth muscle myosin heavy chain, calponin and p63 immunostains. There  are no atypical or malignant epithelial or stroma findings identified. The surgical resection margin(s) of the specimen were inked and microscopically evaluated. [place] RUND DO   RADIOGRAPHIC STUDIES:  Nm Sentinel Node Inj-no Rpt (breast)  01/08/2013  CLINICAL DATA: left axillary sentinel node biopsy   Sulfur colloid was injected intradermally by the nuclear medicine  technologist for breast cancer sentinel node localization.     [employer] [employer] Breast Loc Dev   1st Lesion  Inc US Guide  01/08/2013  *RADIOLOGY REPORT*  Clinical Data:  Known left breast carcinoma.  Preoperative localization.  NEEDLE LOCALIZATION USING ULTRASOUND GUIDANCE AND SPECIMEN RADIOGRAPH  Comparison: Previous exams.  Patient presents for needle localization prior to needle localization of the left breast mass.  I met with the patient and we discussed the procedure of needle localization including benefits and alternatives. We discussed the high likelihood of a successful procedure. We discussed the risks of the procedure, including infection, bleeding, tissue injury, and further surgery. Informed, written consent was  given.  Using ultrasound guidance, sterile technique, 2% lidocaine and a 7 cm modified Kopans needle, the mass with clip was localized using a lateral approach.  The films are marked for Dr. Dwain Sarna.  Specimen radiograph is performed at main OR, and confirms the mass, intact wire, and clip to be present in the tissue sample.  The specimen is marked for pathology.  IMPRESSION: Needle localization of the left breast.  No apparent complications.   Original Report Authenticated By: Rolla Plate, M.D.     ASSESSMENT: 62 year old female with  #1 new diagnosis of invasive ductal carcinoma that is triple negative measuring 1.4 cm node-negative. Patient is status post lumpectomy. Postoperatively she is doing well.  #2 patient and I discussed adjuvant therapy. She would be a candidate for adjuvant chemotherapy consisting of  CMF given every 3 weeks for a total of 6 cycles. Risks and benefits of treatment were discussed with her.   PLAN:   #1 patient will need a Port-A-Cath placed.  #2 she will need chemotherapy teaching class.  #3 my plan will be to get her started on chemotherapy in about 3-4 weeks time.   All questions were answered. The patient knows to call the clinic with any problems, questions or concerns. We can certainly see the patient much sooner if necessary.  I spent 25 minutes counseling the patient face to face. The total time spent in the appointment was 30 minutes.    Drue Second, MD Medical/Oncology [facility] [phone] (beeper) [phone] (Office)

## 2013-01-21 ENCOUNTER — Other Ambulatory Visit: Payer: BC Managed Care – PPO

## 2013-01-21 ENCOUNTER — Encounter: Payer: Self-pay | Admitting: *Deleted

## 2013-01-23 ENCOUNTER — Encounter (INDEPENDENT_AMBULATORY_CARE_PROVIDER_SITE_OTHER): Payer: Self-pay | Admitting: General Surgery

## 2013-01-23 ENCOUNTER — Ambulatory Visit (INDEPENDENT_AMBULATORY_CARE_PROVIDER_SITE_OTHER): Payer: BC Managed Care – PPO | Admitting: General Surgery

## 2013-01-23 VITALS — BP 150/82 | HR 64 | Temp 97.2°F | Resp 16 | Ht 65.0 in | Wt 143.0 lb

## 2013-01-23 DIAGNOSIS — Z09 Encounter for follow-up examination after completed treatment for conditions other than malignant neoplasm: Secondary | ICD-10-CM

## 2013-01-23 NOTE — Progress Notes (Signed)
Subjective:     Patient ID: Marisa Forbes, female   DOB: 12/27/1950, 62 y.o.   MRN: [id number]  HPI 62 yof with DES s/p mi in November with newly diagnosed left breast cancer. She was admitted prior to surgery after being off her brilinta. She was on integrilin and then had lumpectomy/snbx through integrilin window.  She was put back on brilinta the next day and sent home.  She has hematoma on the left side but is doing well.  Her path is stage I node negative tnbc.    Review of Systems     Objective:   Physical Exam Left breast hematoma, incisions healing without infection    Assessment:     S/p left breast lumpectomy/snbx     Plan:     She is doing well postop.  Her hematoma will resolve.  We discussed port placement as she will need chemotherapy.  We discussed risks including bleeding, infection, ptx.  I will have to decide what to do with antiplatelet therapy.  I will call and let her know.       

## 2013-01-28 ENCOUNTER — Telehealth: Payer: Self-pay | Admitting: Medical Oncology

## 2013-01-28 NOTE — Telephone Encounter (Signed)
Alisha from Dr Doreen Salvage office called regarding pt needing PAC placement and Dr Doreen Salvage first available isn't till after 04/21 and port may need to be placed by IR. Dr Doreen Salvage office wished to inform Dr Welton Flakes of this situation.  Per MD notes 03/19 plan to get patient started on chemotherapy in about 3-4 weeks time. Mssg forwarded to MD.  Clovis Cao appts lab/MD 04/08

## 2013-01-29 ENCOUNTER — Other Ambulatory Visit: Payer: Self-pay | Admitting: Oncology

## 2013-01-29 ENCOUNTER — Telehealth: Payer: Self-pay | Admitting: Medical Oncology

## 2013-01-29 ENCOUNTER — Telehealth: Payer: Self-pay | Admitting: Internal Medicine

## 2013-01-29 ENCOUNTER — Telehealth (INDEPENDENT_AMBULATORY_CARE_PROVIDER_SITE_OTHER): Payer: Self-pay

## 2013-01-29 DIAGNOSIS — C50212 Malignant neoplasm of upper-inner quadrant of left female breast: Secondary | ICD-10-CM

## 2013-01-29 NOTE — Telephone Encounter (Signed)
She will need to ask her cardiologist about this

## 2013-01-29 NOTE — Telephone Encounter (Signed)
I have put an order in for the IR port palcement.  I sent in a POF to the schdulers  Please let Dr Kerry Dory office know that we will take care of it  Thanks

## 2013-01-29 NOTE — Telephone Encounter (Signed)
As Dr Welton Flakes requested, informed Elease Hashimoto at Dr Kerry Dory office that Dr Welton Flakes sched pt's port placement through IR.

## 2013-01-29 NOTE — Telephone Encounter (Signed)
[facility] notified per MD

## 2013-01-29 NOTE — Telephone Encounter (Signed)
Hale Drone calling to let us know that Dr Welton Flakes put an order into IR for the pt to get her PAC put in through the hospital. I will notify the pt.

## 2013-01-29 NOTE — Telephone Encounter (Signed)
Pt is on Brilinta.  She is going to have a port a cath on April 10.  They need her to be off of Brilinta starting on April 4.  They need an OK for this.

## 2013-01-30 ENCOUNTER — Telehealth (INDEPENDENT_AMBULATORY_CARE_PROVIDER_SITE_OTHER): Payer: Self-pay

## 2013-01-30 ENCOUNTER — Telehealth: Payer: Self-pay | Admitting: Medical Oncology

## 2013-01-30 ENCOUNTER — Encounter: Payer: Self-pay | Admitting: Internal Medicine

## 2013-01-30 NOTE — Telephone Encounter (Signed)
Patient called, sounded very upset and expressed concern with the directions she was given re Brilinta and sched PAC placement. States she was told to hold Brilinta for surgery and according to pt this is conflicting information she had received when she had a lumpectomy back in March.  This nurse called Dr Llana Aliment office to inform them of patient's concerns, triage nurse stated she will inform Elease Hashimoto, Dr Doreen Salvage nurse.

## 2013-01-30 NOTE — Telephone Encounter (Signed)
Marisa Forbes called to report the pt called her close to tears not sure what to do about stopping her Brilinta.  Please call the pt to clarify instructions.

## 2013-01-31 ENCOUNTER — Telehealth (INDEPENDENT_AMBULATORY_CARE_PROVIDER_SITE_OTHER): Payer: Self-pay

## 2013-01-31 ENCOUNTER — Telehealth: Payer: Self-pay | Admitting: Oncology

## 2013-01-31 ENCOUNTER — Other Ambulatory Visit: Payer: Self-pay | Admitting: Radiology

## 2013-01-31 NOTE — Telephone Encounter (Signed)
I called pt to let her know that Dr Milta Deiters office is working on the instructions for stopping the Brilinta and they will call pt with details. The pt understands.

## 2013-01-31 NOTE — Telephone Encounter (Signed)
Message copied by Victorino December on Fri Jan 31, 2013  5:19 PM ------      Message from: Dwain Sarna, MATTHEW      Created: Wed Jan 29, 2013 12:31 PM      Regarding: RE: port a cath       I heard you sent to ir that's fine as I cannot do it until 21st. Out of town a week.  I assume that was too late. Ir needs to know that she is on brilinta and Asa for a stent. She cannot come off both of them. I brought her in and did her surgery through integrilin window. I was just going to do it on dual antiplatelet therapy.  Cards will do pacers etc on them. A little higher incidence of pocket hematoma.  If ir needs to talk to cards it is Daphene Jaeger.       Matt      ----- Message -----         From: Victorino December, MD         Sent: 01/15/2013  12:19 PM           To: Emelia Loron, MD      Subject: port a cath                                              [facility]            I need a port on Ms Runk. At your convenience             Plan to give CMF q 21 days x 6 cycles.            Thank you             KK ------

## 2013-01-31 NOTE — Telephone Encounter (Signed)
Marisa Forbes returned my call. I explained to her the pt was upset yesterday when she received a call from the hospital about her PAC being done 4/10 and she was instructed to stop her Brilenta medication 5 days before surgery. The pt has been told by Dr Tresa Endo not to stop the medication for that long of a period b/c her risk for heart attack would be extremely high. I advised Marisa Forbes that Dr Dwain Sarna sent Dr Welton Flakes a message about the pt's cardiac issues and the cardiologist name which is Dr Tresa Endo. I advised Marisa Forbes that they would need to call Dr Landry Dyke office to see when the pt can stop her medication and then instruct pt along with IR at the hospital. Marisa Forbes understands and I gave Marisa Forbes Dr [facility] office#.

## 2013-01-31 NOTE — Telephone Encounter (Signed)
LMOM for Marisa Forbes to call me to discuss this pt's cardiac medicine.

## 2013-02-03 ENCOUNTER — Other Ambulatory Visit (INDEPENDENT_AMBULATORY_CARE_PROVIDER_SITE_OTHER): Payer: Self-pay | Admitting: General Surgery

## 2013-02-03 ENCOUNTER — Other Ambulatory Visit: Payer: Self-pay | Admitting: Radiology

## 2013-02-04 ENCOUNTER — Other Ambulatory Visit: Payer: BC Managed Care – PPO | Admitting: Lab

## 2013-02-04 ENCOUNTER — Telehealth (INDEPENDENT_AMBULATORY_CARE_PROVIDER_SITE_OTHER): Payer: Self-pay | Admitting: *Deleted

## 2013-02-04 ENCOUNTER — Ambulatory Visit: Payer: BC Managed Care – PPO | Admitting: Oncology

## 2013-02-04 NOTE — Telephone Encounter (Signed)
Patient called and was scheduled for her portacath.  When she was sent to this RN patient states she is on Brilinta due to a previous MI with stent placement.  Patient is seen by [facility], Dr. Tresa Endo.  Patient is concerned because she knows in the past she has had to come off this medication prior to surgery.

## 2013-02-04 NOTE — Telephone Encounter (Signed)
Called Marisa Forbes to let her know that she does not have to stop the Brilinta per Dr Dwain Sarna for the pac insertion. The Marisa Forbes will stay on the Brilinta for the [facility] insertion per Dr Dwain Sarna. The Marisa Forbes understands.

## 2013-02-05 ENCOUNTER — Other Ambulatory Visit: Payer: Self-pay | Admitting: Oncology

## 2013-02-05 ENCOUNTER — Encounter (HOSPITAL_COMMUNITY): Payer: Self-pay | Admitting: Pharmacy Technician

## 2013-02-05 ENCOUNTER — Other Ambulatory Visit: Payer: Self-pay | Admitting: Emergency Medicine

## 2013-02-05 DIAGNOSIS — C50212 Malignant neoplasm of upper-inner quadrant of left female breast: Secondary | ICD-10-CM

## 2013-02-05 MED ORDER — LORAZEPAM 0.5 MG PO TABS: 0.5000 mg | ORAL_TABLET | Freq: Four times a day (QID) | ORAL | Status: DC | PRN

## 2013-02-05 MED ORDER — PROCHLORPERAZINE MALEATE 10 MG PO TABS: 10.0000 mg | ORAL_TABLET | Freq: Four times a day (QID) | ORAL | Status: DC | PRN

## 2013-02-05 MED ORDER — LIDOCAINE-PRILOCAINE 2.5-2.5 % EX CREA: TOPICAL_CREAM | CUTANEOUS | Status: DC | PRN

## 2013-02-05 MED ORDER — ONDANSETRON HCL 8 MG PO TABS: 8.0000 mg | ORAL_TABLET | Freq: Two times a day (BID) | ORAL | Status: DC

## 2013-02-05 MED ORDER — DEXAMETHASONE 4 MG PO TABS: 8.0000 mg | ORAL_TABLET | Freq: Two times a day (BID) | ORAL | Status: DC

## 2013-02-05 MED ORDER — PROCHLORPERAZINE 25 MG RE SUPP: 25.0000 mg | Freq: Two times a day (BID) | RECTAL | Status: DC | PRN

## 2013-02-06 ENCOUNTER — Inpatient Hospital Stay (HOSPITAL_COMMUNITY): Admission: RE | Admit: 2013-02-06 | Payer: BC Managed Care – PPO | Source: Ambulatory Visit

## 2013-02-06 ENCOUNTER — Other Ambulatory Visit: Payer: Self-pay | Admitting: *Deleted

## 2013-02-06 ENCOUNTER — Ambulatory Visit (HOSPITAL_COMMUNITY): Admission: RE | Admit: 2013-02-06 | Payer: BC Managed Care – PPO | Source: Ambulatory Visit

## 2013-02-06 ENCOUNTER — Other Ambulatory Visit: Payer: Self-pay | Admitting: Emergency Medicine

## 2013-02-06 DIAGNOSIS — C50919 Malignant neoplasm of unspecified site of unspecified female breast: Secondary | ICD-10-CM

## 2013-02-06 MED ORDER — PROMETHAZINE HCL 25 MG RE SUPP: 25.0000 mg | Freq: Four times a day (QID) | RECTAL | Status: DC | PRN

## 2013-02-06 MED ORDER — LIDOCAINE-PRILOCAINE 2.5-2.5 % EX CREA: TOPICAL_CREAM | CUTANEOUS | Status: DC | PRN

## 2013-02-06 MED ORDER — DEXAMETHASONE 4 MG PO TABS: ORAL_TABLET | ORAL | Status: DC

## 2013-02-06 MED ORDER — ONDANSETRON HCL 8 MG PO TABS: ORAL_TABLET | ORAL | Status: DC

## 2013-02-06 MED ORDER — PROCHLORPERAZINE MALEATE 10 MG PO TABS: 10.0000 mg | ORAL_TABLET | Freq: Four times a day (QID) | ORAL | Status: DC | PRN

## 2013-02-07 ENCOUNTER — Telehealth: Payer: Self-pay | Admitting: *Deleted

## 2013-02-07 NOTE — Telephone Encounter (Signed)
Per staff message and POF I have scheduled appts.  JMW  

## 2013-02-07 NOTE — Pre-Procedure Instructions (Signed)
Marisa Forbes  02/07/2013   Your procedure is scheduled on:  02-17-2013  Monday   Report to [facility] at 10:30 AM. Take [name] to the 3rd floor   Call this number if you have problems the morning of surgery: [phone]   Remember:   Do not eat food or drink liquids after midnight.   Take these medicines the morning of surgery with A SIP OF WATER: valium as needed,imdur,metoprolol,              No Insulin the morning of surgery   Do not wear jewelry, make-up or nail polish.  Do not wear lotions, powders, or perfumes.   Do not shave 48 hours prior to surgery.   Do not bring valuables to the hospital.  Contacts, dentures or bridgework may not be worn into surgery.       Patients discharged the day of surgery will not be allowed to drive home.   Name and phone number of your driver: ____________________   Special Instructions: Shower using CHG 2 nights before surgery and the night before surgery.  If you shower the day of surgery use CHG.  Use special wash - you have one bottle of CHG for all showers.  You should use approximately 1/3 of the bottle for each shower.    Please read over the following fact sheets that you were given: Pain Booklet and Surgical Site Infection Prevention

## 2013-02-10 ENCOUNTER — Encounter (HOSPITAL_COMMUNITY): Payer: Self-pay

## 2013-02-10 ENCOUNTER — Encounter (HOSPITAL_COMMUNITY)
Admission: RE | Admit: 2013-02-10 | Discharge: 2013-02-10 | Disposition: A | Discharge status: Home / Self Care | Payer: BC Managed Care – PPO | Source: Ambulatory Visit | Attending: General Surgery | Admitting: General Surgery

## 2013-02-10 LAB — PROTIME-INR
INR: 0.98 (ref 0.00–1.49)
Prothrombin Time: 12.9 seconds (ref 11.6–15.2)

## 2013-02-10 LAB — CBC WITH DIFFERENTIAL/PLATELET
Basophils Absolute: 0 10*3/uL (ref 0.0–0.1)
Basophils Relative: 0 % (ref 0–1)
Eosinophils Absolute: 0.2 10*3/uL (ref 0.0–0.7)
Eosinophils Relative: 3 % (ref 0–5)
HCT: 43.5 % (ref 36.0–46.0)
Hemoglobin: 14.9 g/dL (ref 12.0–15.0)
Lymphocytes Relative: 35 % (ref 12–46)
Lymphs Abs: 2.2 10*3/uL (ref 0.7–4.0)
MCH: 28.6 pg (ref 26.0–34.0)
MCHC: 34.3 g/dL (ref 30.0–36.0)
MCV: 83.5 fL (ref 78.0–100.0)
Monocytes Absolute: 0.5 10*3/uL (ref 0.1–1.0)
Monocytes Relative: 9 % (ref 3–12)
Neutro Abs: 3.4 10*3/uL (ref 1.7–7.7)
Neutrophils Relative %: 54 % (ref 43–77)
Platelets: 195 10*3/uL (ref 150–400)
RBC: 5.21 MIL/uL — ABNORMAL HIGH (ref 3.87–5.11)
RDW: 13 % (ref 11.5–15.5)
WBC: 6.3 10*3/uL (ref 4.0–10.5)

## 2013-02-10 LAB — BASIC METABOLIC PANEL
BUN: 10 mg/dL (ref 6–23)
CO2: 27 mEq/L (ref 19–32)
Calcium: 9.7 mg/dL (ref 8.4–10.5)
Chloride: 108 mEq/L (ref 96–112)
Creatinine, Ser: 0.51 mg/dL (ref 0.50–1.10)
GFR calc Af Amer: >90 mL/min (ref >90)
GFR calc non Af Amer: >90 mL/min (ref >90)
Glucose, Bld: 69 mg/dL — ABNORMAL LOW (ref 70–99)
Potassium: 4 mEq/L (ref 3.5–5.1)
Sodium: 144 mEq/L (ref 135–145)

## 2013-02-10 LAB — APTT: aPTT: 26 seconds (ref 24–37)

## 2013-02-10 LAB — SURGICAL PCR SCREEN
MRSA, PCR: NEGATIVE
Staphylococcus aureus: NEGATIVE

## 2013-02-16 MED ORDER — CEFAZOLIN SODIUM-DEXTROSE 2-3 GM-% IV SOLR
2.0000 g | INTRAVENOUS | Status: AC
  Administered 2013-02-17: 2 g via INTRAVENOUS
  Filled 2013-02-16: qty 50

## 2013-02-17 ENCOUNTER — Ambulatory Visit (HOSPITAL_COMMUNITY): Payer: BC Managed Care – PPO | Admitting: Anesthesiology

## 2013-02-17 ENCOUNTER — Encounter (HOSPITAL_COMMUNITY)
Admission: RE | Disposition: A | Discharge status: Home / Self Care | Payer: Self-pay | Source: Ambulatory Visit | Attending: General Surgery

## 2013-02-17 ENCOUNTER — Ambulatory Visit (HOSPITAL_COMMUNITY): Payer: BC Managed Care – PPO

## 2013-02-17 ENCOUNTER — Ambulatory Visit (HOSPITAL_COMMUNITY)
Admission: RE | Admit: 2013-02-17 | Discharge: 2013-02-17 | Disposition: A | Discharge status: Home / Self Care | Payer: BC Managed Care – PPO | Source: Ambulatory Visit | Attending: General Surgery | Admitting: General Surgery

## 2013-02-17 ENCOUNTER — Encounter: Payer: Self-pay | Admitting: *Deleted

## 2013-02-17 ENCOUNTER — Encounter (INDEPENDENT_AMBULATORY_CARE_PROVIDER_SITE_OTHER): Payer: Self-pay | Admitting: General Surgery

## 2013-02-17 ENCOUNTER — Encounter (HOSPITAL_COMMUNITY): Payer: Self-pay | Admitting: *Deleted

## 2013-02-17 ENCOUNTER — Encounter (HOSPITAL_COMMUNITY): Payer: Self-pay | Admitting: Anesthesiology

## 2013-02-17 DIAGNOSIS — I10 Essential (primary) hypertension: Secondary | ICD-10-CM | POA: Insufficient documentation

## 2013-02-17 DIAGNOSIS — I2510 Atherosclerotic heart disease of native coronary artery without angina pectoris: Secondary | ICD-10-CM | POA: Insufficient documentation

## 2013-02-17 DIAGNOSIS — C50919 Malignant neoplasm of unspecified site of unspecified female breast: Secondary | ICD-10-CM | POA: Insufficient documentation

## 2013-02-17 DIAGNOSIS — E119 Type 2 diabetes mellitus without complications: Secondary | ICD-10-CM | POA: Insufficient documentation

## 2013-02-17 DIAGNOSIS — I252 Old myocardial infarction: Secondary | ICD-10-CM | POA: Insufficient documentation

## 2013-02-17 DIAGNOSIS — G709 Myoneural disorder, unspecified: Secondary | ICD-10-CM | POA: Insufficient documentation

## 2013-02-17 DIAGNOSIS — Z9861 Coronary angioplasty status: Secondary | ICD-10-CM | POA: Insufficient documentation

## 2013-02-17 DIAGNOSIS — Z171 Estrogen receptor negative status [ER-]: Secondary | ICD-10-CM | POA: Insufficient documentation

## 2013-02-17 DIAGNOSIS — Z7902 Long term (current) use of antithrombotics/antiplatelets: Secondary | ICD-10-CM | POA: Insufficient documentation

## 2013-02-17 DIAGNOSIS — Z8673 Personal history of transient ischemic attack (TIA), and cerebral infarction without residual deficits: Secondary | ICD-10-CM | POA: Insufficient documentation

## 2013-02-17 DIAGNOSIS — I739 Peripheral vascular disease, unspecified: Secondary | ICD-10-CM | POA: Insufficient documentation

## 2013-02-17 HISTORY — PX: PORTACATH PLACEMENT: SHX2246

## 2013-02-17 LAB — GLUCOSE, CAPILLARY
Glucose-Capillary: 195 mg/dL — ABNORMAL HIGH (ref 70–99)
Glucose-Capillary: 170 mg/dL — ABNORMAL HIGH (ref 70–99)

## 2013-02-17 SURGERY — INSERTION, TUNNELED CENTRAL VENOUS DEVICE, WITH PORT
Anesthesia: General | Site: Chest | Laterality: Right | Wound class: Clean

## 2013-02-17 MED ORDER — OXYCODONE HCL 5 MG/5ML PO SOLN: 5.0000 mg | Freq: Once | ORAL | Status: DC | PRN

## 2013-02-17 MED ORDER — OXYCODONE-ACETAMINOPHEN 5-325 MG PO TABS: 1.0000 | ORAL_TABLET | ORAL | Status: DC | PRN

## 2013-02-17 MED ORDER — PHENYLEPHRINE HCL 10 MG/ML IJ SOLN
INTRAMUSCULAR | Status: DC | PRN
  Administered 2013-02-17: 80 ug via INTRAVENOUS

## 2013-02-17 MED ORDER — MIDAZOLAM HCL 5 MG/5ML IJ SOLN
INTRAMUSCULAR | Status: DC | PRN
  Administered 2013-02-17: 2 mg via INTRAVENOUS

## 2013-02-17 MED ORDER — PROPOFOL 10 MG/ML IV BOLUS
INTRAVENOUS | Status: DC | PRN
  Administered 2013-02-17: 170 mg via INTRAVENOUS

## 2013-02-17 MED ORDER — HEPARIN SOD (PORK) LOCK FLUSH 100 UNIT/ML IV SOLN
INTRAVENOUS | Status: AC
  Filled 2013-02-17: qty 5

## 2013-02-17 MED ORDER — LACTATED RINGERS IV SOLN
INTRAVENOUS | Status: DC | PRN
  Administered 2013-02-17: 11:00:00 via INTRAVENOUS

## 2013-02-17 MED ORDER — ONDANSETRON HCL 4 MG/2ML IJ SOLN
INTRAMUSCULAR | Status: DC | PRN
  Administered 2013-02-17: 4 mg via INTRAVENOUS

## 2013-02-17 MED ORDER — BUPIVACAINE HCL (PF) 0.25 % IJ SOLN
INTRAMUSCULAR | Status: AC
  Filled 2013-02-17: qty 30

## 2013-02-17 MED ORDER — BUPIVACAINE HCL (PF) 0.25 % IJ SOLN
INTRAMUSCULAR | Status: DC | PRN
  Administered 2013-02-17: 10 mL

## 2013-02-17 MED ORDER — FENTANYL CITRATE 0.05 MG/ML IJ SOLN
INTRAMUSCULAR | Status: DC | PRN
  Administered 2013-02-17 (×2): 50 ug via INTRAVENOUS

## 2013-02-17 MED ORDER — FENTANYL CITRATE 0.05 MG/ML IJ SOLN: 25.0000 ug | INTRAMUSCULAR | Status: DC | PRN

## 2013-02-17 MED ORDER — LACTATED RINGERS IV SOLN
INTRAVENOUS | Status: DC
  Administered 2013-02-17: 10:00:00 via INTRAVENOUS

## 2013-02-17 MED ORDER — LIDOCAINE HCL (CARDIAC) 20 MG/ML IV SOLN
INTRAVENOUS | Status: DC | PRN
  Administered 2013-02-17: 75 mg via INTRAVENOUS

## 2013-02-17 MED ORDER — SODIUM CHLORIDE 0.9 % IR SOLN
Status: DC | PRN
  Administered 2013-02-17: 11:00:00

## 2013-02-17 MED ORDER — OXYCODONE HCL 5 MG PO TABS
5.0000 mg | ORAL_TABLET | Freq: Once | ORAL | Status: DC | PRN
Start: 2013-02-17 — End: 2013-02-17

## 2013-02-17 MED ORDER — ONDANSETRON HCL 4 MG/2ML IJ SOLN: 4.0000 mg | Freq: Four times a day (QID) | INTRAMUSCULAR | Status: DC | PRN

## 2013-02-17 SURGICAL SUPPLY — 50 items
ADH SKN CLS APL DERMABOND .7 (GAUZE/BANDAGES/DRESSINGS) ×1
BAG DECANTER FOR FLEXI CONT (MISCELLANEOUS) ×2 IMPLANT
BLADE SURG 11 STRL SS (BLADE) ×2 IMPLANT
BLADE SURG 15 STRL LF DISP TIS (BLADE) ×1 IMPLANT
BLADE SURG 15 STRL SS (BLADE) ×2
CHLORAPREP W/TINT 26ML (MISCELLANEOUS) ×2 IMPLANT
CLOTH BEACON ORANGE TIMEOUT ST (SAFETY) ×2 IMPLANT
COVER SURGICAL LIGHT HANDLE (MISCELLANEOUS) ×2 IMPLANT
CRADLE DONUT ADULT HEAD (MISCELLANEOUS) ×2 IMPLANT
DECANTER SPIKE VIAL GLASS SM (MISCELLANEOUS) ×2 IMPLANT
DERMABOND ADVANCED (GAUZE/BANDAGES/DRESSINGS) ×1
DERMABOND ADVANCED .7 DNX12 (GAUZE/BANDAGES/DRESSINGS) ×1 IMPLANT
DRAPE C-ARM 42X72 X-RAY (DRAPES) ×2 IMPLANT
DRAPE LAPAROSCOPIC ABDOMINAL (DRAPES) ×2 IMPLANT
ELECT CAUTERY BLADE 6.4 (BLADE) ×2 IMPLANT
ELECT REM PT RETURN 9FT ADLT (ELECTROSURGICAL) ×2
ELECTRODE REM PT RTRN 9FT ADLT (ELECTROSURGICAL) ×1 IMPLANT
GAUZE SPONGE 4X4 16PLY XRAY LF (GAUZE/BANDAGES/DRESSINGS) ×2 IMPLANT
GLOVE BIO SURGEON STRL SZ7 (GLOVE) ×2 IMPLANT
GLOVE BIOGEL PI IND STRL 7.5 (GLOVE) ×1 IMPLANT
GLOVE BIOGEL PI INDICATOR 7.5 (GLOVE) ×1
GOWN STRL NON-REIN LRG LVL3 (GOWN DISPOSABLE) ×4 IMPLANT
INTRODUCER COOK 11FR (CATHETERS) IMPLANT
KIT BASIN OR (CUSTOM PROCEDURE TRAY) ×2 IMPLANT
KIT PORT POWER 8FR ISP CVUE (Catheter) ×1 IMPLANT
KIT PORT POWER 9.6FR MRI PREA (Catheter) IMPLANT
KIT PORT POWER ISP 8FR (Catheter) IMPLANT
KIT POWER CATH 8FR (Catheter) IMPLANT
KIT ROOM TURNOVER OR (KITS) ×2 IMPLANT
NDL HYPO 25GX1X1/2 BEV (NEEDLE) ×1 IMPLANT
NEEDLE HYPO 25GX1X1/2 BEV (NEEDLE) ×2 IMPLANT
NS IRRIG 1000ML POUR BTL (IV SOLUTION) ×2 IMPLANT
PACK SURGICAL SETUP 50X90 (CUSTOM PROCEDURE TRAY) ×2 IMPLANT
PAD ARMBOARD 7.5X6 YLW CONV (MISCELLANEOUS) ×4 IMPLANT
PENCIL BUTTON HOLSTER BLD 10FT (ELECTRODE) ×2 IMPLANT
SET INTRODUCER 12FR PACEMAKER (SHEATH) IMPLANT
SET SHEATH INTRODUCER 10FR (MISCELLANEOUS) IMPLANT
SHEATH COOK PEEL AWAY SET 9F (SHEATH) IMPLANT
SUT MNCRL AB 4-0 PS2 18 (SUTURE) ×2 IMPLANT
SUT PROLENE 2 0 SH 30 (SUTURE) ×2 IMPLANT
SUT SILK 2 0 (SUTURE)
SUT SILK 2-0 18XBRD TIE 12 (SUTURE) IMPLANT
SUT VIC AB 3-0 SH 27 (SUTURE) ×2
SUT VIC AB 3-0 SH 27XBRD (SUTURE) ×1 IMPLANT
SYR 20ML ECCENTRIC (SYRINGE) ×4 IMPLANT
SYR 5ML LUER SLIP (SYRINGE) ×2 IMPLANT
SYR CONTROL 10ML LL (SYRINGE) ×1 IMPLANT
TOWEL OR 17X24 6PK STRL BLUE (TOWEL DISPOSABLE) ×2 IMPLANT
TOWEL OR 17X26 10 PK STRL BLUE (TOWEL DISPOSABLE) ×2 IMPLANT
WATER STERILE IRR 1000ML POUR (IV SOLUTION) IMPLANT

## 2013-02-17 NOTE — Interval H&P Note (Signed)
History and Physical Interval Note:  02/17/2013 11:05 AM  Marisa Forbes  has presented today for surgery, with the diagnosis of breast cancer  The various methods of treatment have been discussed with the patient and family. After consideration of risks, benefits and other options for treatment, the patient has consented to  Procedure(s): INSERTION PORT-A-CATH (N/A) as a surgical intervention .  The patient's history has been reviewed, patient examined, no change in status, stable for surgery.  I have reviewed the patient's chart and labs.  Questions were answered to the patient's satisfaction.     [name]

## 2013-02-17 NOTE — Anesthesia Postprocedure Evaluation (Signed)
Anesthesia Post Note  Patient: Marisa Forbes  Procedure(s) Performed: Procedure(s) (LRB): INSERTION PORT-A-CATH (Right)  Anesthesia type: General  Patient location: PACU  Post pain: Pain level controlled and Adequate analgesia  Post assessment: Post-op Vital signs reviewed, Patient's Cardiovascular Status Stable, Respiratory Function Stable, Patent Airway and Pain level controlled  Last Vitals:  Filed Vitals:   02/17/13 1245  BP: 159/82  Pulse:   Temp: 36.3 C  Resp:     Post vital signs: Reviewed and stable  Level of consciousness: awake, alert  and oriented  Complications: No apparent anesthesia complications

## 2013-02-17 NOTE — Op Note (Signed)
Preoperative diagnosis: Left breast cancer Postoperative diagnosis: Same as above Procedure: Right subclavian power port insertion Surgeon: Dr. Harden Mo Anesthesia: Gen. With LMA Estimated blood loss: Minimal Complications: None Specimens: None Drains: None Sponge needle count correct x2 at end of operation Disposition to recovery in stable condition  Indications: This is a 62 year old female who I did a lumpectomy and sentinel lymph node biopsy for triple negative breast cancer. She has been seen by medical oncology and will begin chemotherapy. She is also on dual antiplatelet therapy for a drug-eluting stent after a myocardial infarction in November. We discussed placing a port while on dual antiplatelet therapy with a higher incidence of pocket hematoma. She understands the risks and benefits of proceeding.  Procedure: After informed consent was obtained the patient was taken to the operating room. She was administered 2 g of intravenous cefazolin. Sequential compression devices were placed on her legs. She was then placed under general anesthesia with an LMA. Her arms were tucked and appropriate padded. An axillary roll was placed. She was then prepped and draped in the standard sterile surgical fashion. A surgical timeout was performed.  I infiltrated quarter percent Marcaine throughout her right chest as well as on her clavicle. I then accessed her subclavian vein on the first pass. A wire was placed. This was confirmed to be in the correct position by fluoroscopy. I then made an incision below this and developed a pocket with cautery. I took great care to avoid any bleeding in this area. Once I made a pocket I tunneled the line between both of these areas. I then enlarged my insertion site. I placed the dilator and sheath over the wire. This was all done under fluoroscopy. I then removed the wire assembly. I then began passing the line. This did have some resistance at the bend of the  sheath. I pulled the sheath back and eventually  placed a wire back in the line and passed this over and into the correct position. I then removed the wire. The peel-away sheath was then removed also. The line appeared to be in good position I pulled it back to be in the distal cava. I then attached this to the port. I then secured this with 2-0 Prolene sutures in 2 positions. This flushed easily and aspirated blood. I placed heparin inside the port. I then closed this with 3-0 Vicryl and 4-0 Monocryl. Dermabond was placed over this. There was very little bleeding during the procedure. She was then extubated and transferred to recovery stable.

## 2013-02-17 NOTE — Preoperative (Signed)
Beta Blockers   Reason not to administer Beta Blockers:Not Applicable 

## 2013-02-17 NOTE — Anesthesia Preprocedure Evaluation (Signed)
Anesthesia Evaluation  Patient identified by MRN, date of birth, ID band Patient awake    Reviewed: Allergy & Precautions, H&P , NPO status , Patient's Chart, lab work & pertinent test results  Airway Mallampati: II  Neck ROM: full    Dental   Pulmonary          Cardiovascular hypertension, + CAD, + Past MI, + Cardiac Stents and + Peripheral Vascular Disease     Neuro/Psych  Neuromuscular disease CVA    GI/Hepatic   Endo/Other  diabetes, Type 2  Renal/GU      Musculoskeletal   Abdominal   Peds  Hematology   Anesthesia Other Findings   Reproductive/Obstetrics                           Anesthesia Physical Anesthesia Plan  ASA: III  Anesthesia Plan: General   Post-op Pain Management:    Induction: Intravenous  Airway Management Planned: LMA  Additional Equipment:   Intra-op Plan:   Post-operative Plan:   Informed Consent: I have reviewed the patients History and Physical, chart, labs and discussed the procedure including the risks, benefits and alternatives for the proposed anesthesia with the patient or authorized representative who has indicated his/her understanding and acceptance.     Plan Discussed with: CRNA and Surgeon  Anesthesia Plan Comments:         Anesthesia Quick Evaluation

## 2013-02-17 NOTE — H&P (View-Only) (Signed)
Subjective:     Patient ID: Marisa Forbes, female   DOB: [birth year], 62 y.o.   MRN: [id number]  HPI 68 yof with DES s/p mi in November with newly diagnosed left breast cancer. She was admitted prior to surgery after being off her brilinta. She was on integrilin and then had lumpectomy/snbx through integrilin window.  She was put back on brilinta the next day and sent home.  She has hematoma on the left side but is doing well.  Her path is stage I node negative tnbc.    Review of Systems     Objective:   Physical Exam Left breast hematoma, incisions healing without infection    Assessment:     S/p left breast lumpectomy/snbx     Plan:     She is doing well postop.  Her hematoma will resolve.  We discussed port placement as she will need chemotherapy.  We discussed risks including bleeding, infection, ptx.  I will have to decide what to do with antiplatelet therapy.  I will call and let her know.

## 2013-02-17 NOTE — Transfer of Care (Signed)
Immediate Anesthesia Transfer of Care Note  Patient: Marisa Forbes  Procedure(s) Performed: Procedure(s): INSERTION PORT-A-CATH (Right)  Patient Location: PACU  Anesthesia Type:General  Level of Consciousness: awake, alert , oriented and sedated  Airway & Oxygen Therapy: Patient Spontanous Breathing and Patient connected to nasal cannula oxygen  Post-op Assessment: Report given to PACU RN, Post -op Vital signs reviewed and stable and Patient moving all extremities  Post vital signs: Reviewed and stable  Complications: No apparent anesthesia complications

## 2013-02-17 NOTE — Progress Notes (Signed)
Mailed after appt letter to pt. 

## 2013-02-18 ENCOUNTER — Encounter (HOSPITAL_COMMUNITY): Payer: Self-pay | Admitting: General Surgery

## 2013-02-18 ENCOUNTER — Telehealth (INDEPENDENT_AMBULATORY_CARE_PROVIDER_SITE_OTHER): Payer: Self-pay | Admitting: General Surgery

## 2013-02-18 NOTE — Telephone Encounter (Signed)
Spoke with patient she states that she was not advised to come in for po f/u  . I told her please call if she has any issues  With port

## 2013-02-20 ENCOUNTER — Telehealth: Payer: Self-pay | Admitting: *Deleted

## 2013-02-20 ENCOUNTER — Encounter: Payer: Self-pay | Admitting: Oncology

## 2013-02-20 ENCOUNTER — Ambulatory Visit (HOSPITAL_BASED_OUTPATIENT_CLINIC_OR_DEPARTMENT_OTHER): Payer: BC Managed Care – PPO | Admitting: Adult Health

## 2013-02-20 ENCOUNTER — Ambulatory Visit (HOSPITAL_BASED_OUTPATIENT_CLINIC_OR_DEPARTMENT_OTHER): Payer: BC Managed Care – PPO

## 2013-02-20 ENCOUNTER — Encounter: Payer: Self-pay | Admitting: Adult Health

## 2013-02-20 ENCOUNTER — Other Ambulatory Visit: Payer: Self-pay | Admitting: Emergency Medicine

## 2013-02-20 ENCOUNTER — Other Ambulatory Visit: Payer: Self-pay | Admitting: Medical Oncology

## 2013-02-20 ENCOUNTER — Other Ambulatory Visit (HOSPITAL_BASED_OUTPATIENT_CLINIC_OR_DEPARTMENT_OTHER): Payer: BC Managed Care – PPO | Admitting: Lab

## 2013-02-20 VITALS — BP 171/100 | HR 60 | Temp 97.7°F | Resp 20 | Wt 145.6 lb

## 2013-02-20 DIAGNOSIS — Z171 Estrogen receptor negative status [ER-]: Secondary | ICD-10-CM

## 2013-02-20 DIAGNOSIS — C50212 Malignant neoplasm of upper-inner quadrant of left female breast: Secondary | ICD-10-CM

## 2013-02-20 DIAGNOSIS — C50919 Malignant neoplasm of unspecified site of unspecified female breast: Secondary | ICD-10-CM

## 2013-02-20 DIAGNOSIS — Z5111 Encounter for antineoplastic chemotherapy: Secondary | ICD-10-CM

## 2013-02-20 LAB — COMPREHENSIVE METABOLIC PANEL (CC13)
ALT: 19 U/L (ref 0–55)
AST: 14 U/L (ref 5–34)
Albumin: 3.8 g/dL (ref 3.5–5.0)
Alkaline Phosphatase: 96 U/L (ref 40–150)
BUN: 10 mg/dL (ref 7.0–26.0)
CO2: 26 mEq/L (ref 22–29)
Calcium: 9.4 mg/dL (ref 8.4–10.4)
Chloride: 106 mEq/L (ref 98–107)
Creatinine: 0.7 mg/dL (ref 0.6–1.1)
Glucose: 157 mg/dl — ABNORMAL HIGH (ref 70–99)
Potassium: 4.3 mEq/L (ref 3.5–5.1)
Sodium: 140 mEq/L (ref 136–145)
Total Bilirubin: 0.71 mg/dL (ref 0.20–1.20)
Total Protein: 6.9 g/dL (ref 6.4–8.3)

## 2013-02-20 LAB — CBC WITH DIFFERENTIAL/PLATELET
BASO%: 0.2 % (ref 0.0–2.0)
Basophils Absolute: 0 10*3/uL (ref 0.0–0.1)
EOS%: 2.1 % (ref 0.0–7.0)
Eosinophils Absolute: 0.2 10*3/uL (ref 0.0–0.5)
HCT: 48.4 % — ABNORMAL HIGH (ref 34.8–46.6)
HGB: 16.3 g/dL — ABNORMAL HIGH (ref 11.6–15.9)
LYMPH%: 27.3 % (ref 14.0–49.7)
MCH: 28.7 pg (ref 25.1–34.0)
MCHC: 33.7 g/dL (ref 31.5–36.0)
MCV: 85.2 fL (ref 79.5–101.0)
MONO#: 0.8 10*3/uL (ref 0.1–0.9)
MONO%: 9.8 % (ref 0.0–14.0)
NEUT#: 5.2 10*3/uL (ref 1.5–6.5)
NEUT%: 60.6 % (ref 38.4–76.8)
Platelets: 230 10*3/uL (ref 145–400)
RBC: 5.68 10*6/uL — ABNORMAL HIGH (ref 3.70–5.45)
RDW: 12.9 % (ref 11.2–14.5)
WBC: 8.5 10*3/uL (ref 3.9–10.3)
lymph#: 2.3 10*3/uL (ref 0.9–3.3)
nRBC: 0 % (ref 0–0)

## 2013-02-20 MED ORDER — FLUOROURACIL CHEMO INJECTION 2.5 GM/50ML
600.0000 mg/m2 | Freq: Once | INTRAVENOUS | Status: AC
  Administered 2013-02-20: 1050 mg via INTRAVENOUS
  Filled 2013-02-20: qty 21

## 2013-02-20 MED ORDER — HEPARIN SOD (PORK) LOCK FLUSH 100 UNIT/ML IV SOLN
500.0000 [IU] | Freq: Once | INTRAVENOUS | Status: AC | PRN
  Administered 2013-02-20: 500 [IU]
  Filled 2013-02-20: qty 5

## 2013-02-20 MED ORDER — METHOTREXATE SODIUM CHEMO INJECTION 25 MG/ML
40.0000 mg/m2 | Freq: Once | INTRAMUSCULAR | Status: AC
  Administered 2013-02-20: 70 mg via INTRAVENOUS
  Filled 2013-02-20: qty 2.8

## 2013-02-20 MED ORDER — SODIUM CHLORIDE 0.9 % IV SOLN
Freq: Once | INTRAVENOUS | Status: AC
  Administered 2013-02-20: 14:00:00 via INTRAVENOUS

## 2013-02-20 MED ORDER — ONDANSETRON 8 MG/50ML IVPB (CHCC)
8.0000 mg | Freq: Once | INTRAVENOUS | Status: AC
  Administered 2013-02-20: 8 mg via INTRAVENOUS

## 2013-02-20 MED ORDER — CYCLOPHOSPHAMIDE CHEMO INJECTION 1 GM
600.0000 mg/m2 | Freq: Once | INTRAMUSCULAR | Status: AC
  Administered 2013-02-20: 1040 mg via INTRAVENOUS
  Filled 2013-02-20: qty 52

## 2013-02-20 MED ORDER — DEXAMETHASONE SODIUM PHOSPHATE 10 MG/ML IJ SOLN
10.0000 mg | Freq: Once | INTRAMUSCULAR | Status: AC
  Administered 2013-02-20: 10 mg via INTRAVENOUS

## 2013-02-20 MED ORDER — SODIUM CHLORIDE 0.9 % IJ SOLN
10.0000 mL | INTRAMUSCULAR | Status: DC | PRN
  Administered 2013-02-20: 10 mL
  Filled 2013-02-20: qty 10

## 2013-02-20 MED ORDER — LORAZEPAM 2 MG/ML IJ SOLN
0.5000 mg | Freq: Once | INTRAMUSCULAR | Status: AC
  Administered 2013-02-20: 0.5 mg via INTRAVENOUS

## 2013-02-20 NOTE — Telephone Encounter (Signed)
sw pt gv lab appt for 10:45am today. Pt is aware.Marland Kitchentd

## 2013-02-20 NOTE — Progress Notes (Signed)
Put [employer] disability form on nurse's desk. °

## 2013-02-20 NOTE — Patient Instructions (Addendum)
[facility] Discharge Instructions for Patients Receiving Chemotherapy  Today you received the following chemotherapy agents Cytoxan, Adrucil, and Methotrexate.  To help prevent nausea and vomiting after your treatment, we encourage you to take your nausea medication.   If you develop nausea and vomiting that is not controlled by your nausea medication, call the clinic. If it is after clinic hours your family physician or the after hours number for the clinic or go to the Emergency Department.   BELOW ARE SYMPTOMS THAT SHOULD BE REPORTED IMMEDIATELY:  *FEVER GREATER THAN 100.5 F  *CHILLS WITH OR WITHOUT FEVER  NAUSEA AND VOMITING THAT IS NOT CONTROLLED WITH YOUR NAUSEA MEDICATION  *UNUSUAL SHORTNESS OF BREATH  *UNUSUAL BRUISING OR BLEEDING  TENDERNESS IN MOUTH AND THROAT WITH OR WITHOUT PRESENCE OF ULCERS  *URINARY PROBLEMS  *BOWEL PROBLEMS  UNUSUAL RASH Items with * indicate a potential emergency and should be followed up as soon as possible.  One of the nurses will contact you 24 hours after your treatment. Please let the nurse know about any problems that you may have experienced. Feel free to call the clinic you have any questions or concerns. The clinic phone number is [phone].   I have been informed and understand all the instructions given to me. I know to contact the clinic, my physician, or go to the Emergency Department if any problems should occur. I do not have any questions at this time, but understand that I may call the clinic during office hours   should I have any questions or need assistance in obtaining follow up care.    __________________________________________  _____________  __________ Signature of Patient or Authorized Representative            Date                   Time    __________________________________________ Nurse's Signature

## 2013-02-20 NOTE — Patient Instructions (Signed)
Doing well.  Proceed with chemotherapy.  Please call us if you have any questions or concerns.    

## 2013-02-20 NOTE — Progress Notes (Signed)
OFFICE PROGRESS NOTE  CC**  [name], MD [address] 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   PRIOR THERAPY: #1in January 2014 patient was found to have a suspicious area on screening mammography in the upper outer quadrant of the left breast. She ultimately underwent biopsy of this area which revealed invasive ductal carcinoma. the tumor was estrogen and progesterone receptor negative as well as HER-2/neu negative MRI revealed a solitary mass measuring 1.6 x 1.5 x 1.5 cm in size. Patient was seen in the multidisciplinary breast clinic.  #2patient is status post left lumpectomy with sentinel lymph node biopsies. Her final pathology reveals a 1.4 cm invasive ductal carcinoma grade 3, ER negative PR negative HER-2/neu negative with Ki-67 54%. Sentinel lymph nodes were negative for metastatic disease.  #3 patient is recommended adjuvant chemotherapy consisting of CMFto be given every 3 weeks for a total of 6 cycles adjuvantly.I have recommended CMF since patient does have significant cardiac disease and recently had a myocardial infarction. She also has other neurologic disease as well.   CURRENT THERAPY: CMF cycle 1 day 1   INTERVAL HISTORY: Marisa Forbes 62 y.o. female returns for evaluation of her left breast triple negative invasive ductal carcinoma.  She is doing well today and anxious to start her chemotherapy.  She denies fevers, chills, nausea, vomiting, pain, constipation, diarrhea, or any further concerns.  She does have requests for paper work regarding her treatment plan and diagnosis for disability.    MEDICAL HISTORY: Past Medical History  Diagnosis Date  . HTN (hypertension)   . Diabetes mellitus without complication   . Stroke, embolic   . Myocardial infarction 09/07/12  . Breast cancer     ALLERGIES:  has  No Known Allergies.  MEDICATIONS:  Current Outpatient Prescriptions  Medication Sig Dispense Refill  . aspirin EC 81 MG EC tablet Take 1 tablet (81 mg total) by mouth daily.      Marland Kitchen atorvastatin (LIPITOR) 80 MG tablet Take 1 tablet (80 mg total) by mouth daily at 6 PM.  30 tablet  5  . diazepam (VALIUM) 5 MG tablet Take 1 tablet (5 mg total) by mouth every 6 (six) hours as needed for anxiety.  60 tablet  1  . glucose blood (ONETOUCH VERIO) test strip Use TID  100 each  12  . ibuprofen (ADVIL,MOTRIN) 200 MG tablet Take 200 mg by mouth every 6 (six) hours as needed. For pain      . insulin aspart (NOVOLOG) 100 UNIT/ML injection Inject 10 Units into the skin 3 (three) times daily with meals.  1 vial  11  . insulin glargine (LANTUS SOLOSTAR) 100 UNIT/ML injection Inject 31 Units into the skin at bedtime.  9 mL  12  . isosorbide mononitrate (IMDUR) 60 MG 24 hr tablet Take 1 tablet (60 mg total) by mouth daily.  30 tablet  5  . lidocaine-prilocaine (EMLA) cream Apply topically as needed. Apply to port 1-2 hours before procedure  30 g  1  . lisinopril (PRINIVIL,ZESTRIL) 40 MG tablet Take 40 mg by mouth daily.      . metFORMIN (GLUCOPHAGE-XR) 750 MG 24 hr tablet Take 1 tablet (750 mg total) by mouth daily with breakfast.  30 tablet  5  . metoprolol (LOPRESSOR) 50 MG tablet Take 125 mg by mouth 2 (two) times  daily.      . nitroGLYCERIN (NITROSTAT) 0.4 MG SL tablet Place 0.4 mg under the tongue every 5 (five) minutes as needed for chest pain.      Marland Kitchen ondansetron (ZOFRAN) 8 MG tablet Take 1 tablet by mouth 2 times daily starting the day after chemo for 2 days, then take 2 times a day as needed for N/V  30 tablet  1  . ranolazine (RANEXA) 500 MG 12 hr tablet Take 1 tablet (500 mg total) by mouth 2 (two) times daily.  60 tablet  5  . Ticagrelor (BRILINTA) 90 MG TABS tablet Take 1 tablet (90 mg total) by mouth 2 (two) times daily.  60 tablet  11  . oxyCODONE-acetaminophen (ROXICET) 5-325 MG per tablet Take 1  tablet by mouth every 4 (four) hours as needed for pain.  20 tablet  0   No current facility-administered medications for this visit.    SURGICAL HISTORY:  Past Surgical History  Procedure Laterality Date  . Coronary angioplasty with stent placement    . Breast lumpectomy with needle localization and axillary sentinel lymph node bx Left 01/08/2013    Procedure: LEFT BREAST WIRE GUIDED  LUMPECTOMY AND LEFT AXILLARY SENTINEL  NODE BX;  Surgeon: Emelia Loron, MD;  Location: [facility] OR;  Service: General;  Laterality: Left;  . Back surgery    . Portacath placement Right 02/17/2013    Procedure: INSERTION PORT-A-CATH;  Surgeon: Emelia Loron, MD;  Location: [facility] OR;  Service: General;  Laterality: Right;    REVIEW OF SYSTEMS:  General: fatigue (-), night sweats (-), fever (-), pain (-) Lymph: palpable nodes (-) HEENT: vision changes (-), mucositis (-), gum bleeding (-), epistaxis (-) Cardiovascular: chest pain (-), palpitations (-) Pulmonary: shortness of breath (-), dyspnea on exertion (-), cough (-), hemoptysis (-) GI:  Early satiety (-), melena (-), dysphagia (-), nausea/vomiting (-), diarrhea (-) GU: dysuria (-), hematuria (-), incontinence (-) Musculoskeletal: joint swelling (-), joint pain (-), back pain (-) Neuro: weakness (-), numbness (-), headache (-), confusion (-) Skin: Rash (-), lesions (-), dryness (-) Psych: depression (-), suicidal/homicidal ideation (-), feeling of hopelessness (-)   PHYSICAL EXAMINATION: Blood pressure 184/101, pulse 60, temperature 97.7 F (36.5 C), temperature source Oral, resp. rate 20, weight 145 lb 9.6 oz (66.044 kg). Body mass index is 24.23 kg/(m^2). General: Patient is a well appearing female in no acute distress HEENT: PERRLA, sclerae anicteric no conjunctival pallor, MMM Neck: supple, no palpable adenopathy Lungs: clear to auscultation bilaterally, no wheezes, rhonchi, or rales Cardiovascular: regular rate rhythm, S1, S2, no murmurs, rubs or  gallops Abdomen: Soft, non-tender, non-distended, normoactive bowel sounds, no HSM Extremities: warm and well perfused, no clubbing, cyanosis, or edema Skin: No rashes or lesions Neuro: Non-focal Breasts: left lumpectomy site healing well, does have bruising to the left breast, also, right breast without masses or nodularity.   ECOG PERFORMANCE STATUS: 0 - Asymptomatic  LABORATORY DATA: Lab Results  Component Value Date   WBC 8.5 02/20/2013   HGB 16.3* 02/20/2013   HCT 48.4* 02/20/2013   MCV 85.2 02/20/2013   PLT 230 02/20/2013      Chemistry      Component Value Date/Time   NA 144 02/10/2013 1245   NA 142 12/11/2012 1229   K 4.0 02/10/2013 1245   K 4.3 12/11/2012 1229   CL 108 02/10/2013 1245   CL 106 12/11/2012 1229   CO2 27 02/10/2013 1245   CO2 28 12/11/2012 1229   BUN 10 02/10/2013  1245   BUN 12.7 12/11/2012 1229   CREATININE 0.51 02/10/2013 1245   CREATININE 0.7 12/11/2012 1229      Component Value Date/Time   CALCIUM 9.7 02/10/2013 1245   CALCIUM 9.9 12/11/2012 1229   ALKPHOS 67 01/06/2013 2205   ALKPHOS 65 12/11/2012 1229   AST 16 01/06/2013 2205   AST 17 12/11/2012 1229   ALT 17 01/06/2013 2205   ALT 26 12/11/2012 1229   BILITOT 0.2* 01/06/2013 2205   BILITOT 0.56 12/11/2012 1229       RADIOGRAPHIC STUDIES:  Dg Chest Port 1 View  02/17/2013  *RADIOLOGY REPORT*  Clinical Data: Port placement  PORTABLE CHEST - 1 VIEW  Comparison: 09/11/2012  Findings: Port has been placed on the right, introduced from the subclavian vein.  The tip is in the SVC 4 cm above the right atrium.  No pneumothorax.  Lungs are clear.  IMPRESSION: Port well positioned with its tip in the SVC 4 cm above the right atrium.  No complication evident.   Original Report Authenticated By: Paulina Fusi, M.D.    Dg Fluoro Guide Cv Line-no Report  02/17/2013  CLINICAL DATA: portacath placement   FLOURO GUIDE CV LINE  Fluoroscopy was utilized by the requesting physician.  No radiographic  interpretation.       ASSESSMENT: 62 year old female with   #1 new diagnosis of invasive ductal carcinoma that is triple negative measuring 1.4 cm node-negative. Patient is status post lumpectomy. Postoperatively she is doing well. Diagnosis code 174.2.  #2 patient and I discussed adjuvant therapy. She would be a candidate for adjuvant chemotherapy consisting of CMF given every 3 weeks for a total of 6 cycles. Risks and benefits of treatment were discussed with her. The dates of her chemotherapy include 4/24, 5/15, 6/5, 6/26, 7/17, 8/7.  She will also have appointments 7 days after her chemotherapy for labs and an MD/NP appointment for evaluation of chemotoxicities.    PLAN:   1. Marisa Forbes is doing well today.  She doesn't have any questions about how to take her nausea medication, and will proceed with CMF today. I did discuss common side effects to expect and requested she call us with any question or concern.    2.  I will see her back next week for labs and an appointment for evaluation of chemotoxicities.    All questions were answered. The patient knows to call the clinic with any problems, questions or concerns. We can certainly see the patient much sooner if necessary.  I spent 25 minutes counseling the patient face to face. The total time spent in the appointment was 30 minutes.    Cherie Ouch Lyn Hollingshead, NP Medical Oncology [facility] Phone: [phone] 02/20/2013, 12:38 PM

## 2013-02-21 ENCOUNTER — Telehealth: Payer: Self-pay | Admitting: *Deleted

## 2013-02-21 ENCOUNTER — Ambulatory Visit: Payer: BC Managed Care – PPO | Admitting: Internal Medicine

## 2013-02-21 NOTE — Telephone Encounter (Signed)
No adverse event or questions

## 2013-02-24 ENCOUNTER — Telehealth: Payer: Self-pay | Admitting: Oncology

## 2013-02-24 ENCOUNTER — Encounter: Payer: Self-pay | Admitting: Cardiovascular Disease

## 2013-02-24 NOTE — Telephone Encounter (Signed)
, °

## 2013-02-27 ENCOUNTER — Encounter: Payer: Self-pay | Admitting: Adult Health

## 2013-02-27 ENCOUNTER — Ambulatory Visit (HOSPITAL_BASED_OUTPATIENT_CLINIC_OR_DEPARTMENT_OTHER): Payer: BC Managed Care – PPO | Admitting: Adult Health

## 2013-02-27 ENCOUNTER — Other Ambulatory Visit (HOSPITAL_BASED_OUTPATIENT_CLINIC_OR_DEPARTMENT_OTHER): Payer: BC Managed Care – PPO

## 2013-02-27 ENCOUNTER — Ambulatory Visit (HOSPITAL_COMMUNITY)
Admission: RE | Admit: 2013-02-27 | Discharge: 2013-02-27 | Disposition: A | Discharge status: Home / Self Care | Payer: BC Managed Care – PPO | Source: Ambulatory Visit | Attending: Oncology | Admitting: Oncology

## 2013-02-27 VITALS — BP 159/94 | HR 72 | Temp 98.2°F | Resp 20 | Ht 65.0 in | Wt 145.6 lb

## 2013-02-27 DIAGNOSIS — M25421 Effusion, right elbow: Secondary | ICD-10-CM

## 2013-02-27 DIAGNOSIS — C50219 Malignant neoplasm of upper-inner quadrant of unspecified female breast: Secondary | ICD-10-CM

## 2013-02-27 DIAGNOSIS — R609 Edema, unspecified: Secondary | ICD-10-CM

## 2013-02-27 DIAGNOSIS — M7989 Other specified soft tissue disorders: Secondary | ICD-10-CM

## 2013-02-27 DIAGNOSIS — M79601 Pain in right arm: Secondary | ICD-10-CM

## 2013-02-27 DIAGNOSIS — C50919 Malignant neoplasm of unspecified site of unspecified female breast: Secondary | ICD-10-CM

## 2013-02-27 DIAGNOSIS — C50212 Malignant neoplasm of upper-inner quadrant of left female breast: Secondary | ICD-10-CM

## 2013-02-27 DIAGNOSIS — M79609 Pain in unspecified limb: Secondary | ICD-10-CM | POA: Insufficient documentation

## 2013-02-27 DIAGNOSIS — C50912 Malignant neoplasm of unspecified site of left female breast: Secondary | ICD-10-CM

## 2013-02-27 DIAGNOSIS — M25429 Effusion, unspecified elbow: Secondary | ICD-10-CM

## 2013-02-27 LAB — CBC WITH DIFFERENTIAL/PLATELET
BASO%: 0.8 % (ref 0.0–2.0)
Basophils Absolute: 0 10*3/uL (ref 0.0–0.1)
EOS%: 3.9 % (ref 0.0–7.0)
Eosinophils Absolute: 0.2 10*3/uL (ref 0.0–0.5)
HCT: 44.7 % (ref 34.8–46.6)
HGB: 15 g/dL (ref 11.6–15.9)
LYMPH%: 29.5 % (ref 14.0–49.7)
MCH: 28.7 pg (ref 25.1–34.0)
MCHC: 33.7 g/dL (ref 31.5–36.0)
MCV: 85.1 fL (ref 79.5–101.0)
MONO#: 0.1 10*3/uL (ref 0.1–0.9)
MONO%: 1.8 % (ref 0.0–14.0)
NEUT#: 2.6 10*3/uL (ref 1.5–6.5)
NEUT%: 64 % (ref 38.4–76.8)
Platelets: 183 10*3/uL (ref 145–400)
RBC: 5.24 10*6/uL (ref 3.70–5.45)
RDW: 13.1 % (ref 11.2–14.5)
WBC: 4 10*3/uL (ref 3.9–10.3)
lymph#: 1.2 10*3/uL (ref 0.9–3.3)

## 2013-02-27 LAB — COMPREHENSIVE METABOLIC PANEL (CC13)
ALT: 30 U/L (ref 0–55)
AST: 11 U/L (ref 5–34)
Albumin: 3.5 g/dL (ref 3.5–5.0)
Alkaline Phosphatase: 101 U/L (ref 40–150)
BUN: 11.6 mg/dL (ref 7.0–26.0)
CO2: 24 mEq/L (ref 22–29)
Calcium: 9.4 mg/dL (ref 8.4–10.4)
Chloride: 109 mEq/L — ABNORMAL HIGH (ref 98–107)
Creatinine: 0.7 mg/dL (ref 0.6–1.1)
Glucose: 159 mg/dl — ABNORMAL HIGH (ref 70–99)
Potassium: 3.9 mEq/L (ref 3.5–5.1)
Sodium: 141 mEq/L (ref 136–145)
Total Bilirubin: 0.74 mg/dL (ref 0.20–1.20)
Total Protein: 6.5 g/dL (ref 6.4–8.3)

## 2013-02-27 NOTE — Progress Notes (Signed)
OFFICE PROGRESS NOTE  CC**  [name], MD [address] 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   PRIOR THERAPY: #1in January 2014 patient was found to have a suspicious area on screening mammography in the upper outer quadrant of the left breast. She ultimately underwent biopsy of this area which revealed invasive ductal carcinoma. the tumor was estrogen and progesterone receptor negative as well as HER-2/neu negative MRI revealed a solitary mass measuring 1.6 x 1.5 x 1.5 cm in size. Patient was seen in the multidisciplinary breast clinic.  #2patient is status post left lumpectomy with sentinel lymph node biopsies. Her final pathology reveals a 1.4 cm invasive ductal carcinoma grade 3, ER negative PR negative HER-2/neu negative with Ki-67 54%. Sentinel lymph nodes were negative for metastatic disease.  #3 patient is recommended adjuvant chemotherapy consisting of CMFto be given every 3 weeks for a total of 6 cycles adjuvantly.I have recommended CMF since patient does have significant cardiac disease and recently had a myocardial infarction. She also has other neurologic disease as well.   CURRENT THERAPY: CMF cycle 1 day 8   INTERVAL HISTORY: NORITA Forbes 62 y.o. female returns for evaluation of her left breast triple negative invasive ductal carcinoma.  She received her first cycle of CMF last week.  She is fatigued, and weak, but otherwise denies fevers, chills, nausea, vomiting, constipation, diarrhea, numbness, mucositis, change in appetite.  She has pain in her right arm that started since port placement, she denies swelling to the arm, or any other changes.  She drinking 1/2 gallon of drinks, estimated, the beverages are decaffeinated.  Her blood pressure is elevated.  She says its consistently 150s systolic.  She has an  appointment with a cardiologist, Dr. Tenny Craw at [facility] cardiology.    MEDICAL HISTORY: Past Medical History  Diagnosis Date  . HTN (hypertension)   . Diabetes mellitus without complication   . Stroke, embolic   . Myocardial infarction 09/07/12  . Breast cancer     ALLERGIES:  has No Known Allergies.  MEDICATIONS:  Current Outpatient Prescriptions  Medication Sig Dispense Refill  . aspirin EC 81 MG EC tablet Take 1 tablet (81 mg total) by mouth daily.      Marland Kitchen atorvastatin (LIPITOR) 80 MG tablet Take 1 tablet (80 mg total) by mouth daily at 6 PM.  30 tablet  5  . diazepam (VALIUM) 5 MG tablet Take 1 tablet (5 mg total) by mouth every 6 (six) hours as needed for anxiety.  60 tablet  1  . glucose blood (ONETOUCH VERIO) test strip Use TID  100 each  12  . ibuprofen (ADVIL,MOTRIN) 200 MG tablet Take 200 mg by mouth every 6 (six) hours as needed. For pain      . insulin aspart (NOVOLOG) 100 UNIT/ML injection Inject 10 Units into the skin 3 (three) times daily with meals.  1 vial  11  . insulin glargine (LANTUS SOLOSTAR) 100 UNIT/ML injection Inject 31 Units into the skin at bedtime.  9 mL  12  . isosorbide mononitrate (IMDUR) 60 MG 24 hr tablet Take 1 tablet (60 mg total) by mouth daily.  30 tablet  5  . lidocaine-prilocaine (EMLA) cream Apply topically as needed. Apply to port 1-2 hours before procedure  30 g  1  . lisinopril (PRINIVIL,ZESTRIL) 40 MG tablet Take  40 mg by mouth daily.      . metFORMIN (GLUCOPHAGE-XR) 750 MG 24 hr tablet Take 1 tablet (750 mg total) by mouth daily with breakfast.  30 tablet  5  . metoprolol (LOPRESSOR) 50 MG tablet Take 125 mg by mouth 2 (two) times daily.      . nitroGLYCERIN (NITROSTAT) 0.4 MG SL tablet Place 0.4 mg under the tongue every 5 (five) minutes as needed for chest pain.      Marland Kitchen ondansetron (ZOFRAN) 8 MG tablet Take 1 tablet by mouth 2 times daily starting the day after chemo for 2 days, then take 2 times a day as needed for N/V  30 tablet  1  .  ranolazine (RANEXA) 500 MG 12 hr tablet Take 1 tablet (500 mg total) by mouth 2 (two) times daily.  60 tablet  5  . Ticagrelor (BRILINTA) 90 MG TABS tablet Take 1 tablet (90 mg total) by mouth 2 (two) times daily.  60 tablet  11  . oxyCODONE-acetaminophen (ROXICET) 5-325 MG per tablet Take 1 tablet by mouth every 4 (four) hours as needed for pain.  20 tablet  0   No current facility-administered medications for this visit.    SURGICAL HISTORY:  Past Surgical History  Procedure Laterality Date  . Coronary angioplasty with stent placement    . Breast lumpectomy with needle localization and axillary sentinel lymph node bx Left 01/08/2013    Procedure: LEFT BREAST WIRE GUIDED  LUMPECTOMY AND LEFT AXILLARY SENTINEL  NODE BX;  Surgeon: Emelia Loron, MD;  Location: [facility] OR;  Service: General;  Laterality: Left;  . Back surgery    . Portacath placement Right 02/17/2013    Procedure: INSERTION PORT-A-CATH;  Surgeon: Emelia Loron, MD;  Location: [facility] OR;  Service: General;  Laterality: Right;    REVIEW OF SYSTEMS:  General: fatigue (+), night sweats (-), fever (-), pain (-) Lymph: palpable nodes (-) HEENT: vision changes (-), mucositis (-), gum bleeding (-), epistaxis (-) Cardiovascular: chest pain (-), palpitations (-) Pulmonary: shortness of breath (-), dyspnea on exertion (-), cough (-), hemoptysis (-) GI:  Early satiety (-), melena (-), dysphagia (-), nausea/vomiting (-), diarrhea (-) GU: dysuria (-), hematuria (-), incontinence (-) Musculoskeletal: joint swelling (-), joint pain (-), back pain (-) Neuro: weakness (+), numbness (-), headache (-), confusion (-) Skin: Rash (-), lesions (-), dryness (-) Psych: depression (-), suicidal/homicidal ideation (-), feeling of hopelessness (-)   PHYSICAL EXAMINATION: Blood pressure 159/94, pulse 72, temperature 98.2 F (36.8 C), temperature source Oral, resp. rate 20, height 5\' 5"  (1.651 m), weight 145 lb 9.6 oz (66.044 kg). Body mass index is  24.23 kg/(m^2). General: Patient is a well appearing female in no acute distress HEENT: PERRLA, sclerae anicteric no conjunctival pallor, MMM Neck: supple, no palpable adenopathy Lungs: clear to auscultation bilaterally, no wheezes, rhonchi, or rales Cardiovascular: regular rate rhythm, S1, S2, no murmurs, rubs or gallops Abdomen: Soft, non-tender, non-distended, normoactive bowel sounds, no HSM Extremities: warm and well perfused, no clubbing, cyanosis, mild right arm swelling Skin: No rashes or lesions Neuro: Non-focal Breasts: left lumpectomy site healing well, does have bruising resolved to the left breast, also, right breast without masses or nodularity.  Right port with ecchymosis.   ECOG PERFORMANCE STATUS: 0 - Asymptomatic  LABORATORY DATA: Lab Results  Component Value Date   WBC 4.0 02/27/2013   HGB 15.0 02/27/2013   HCT 44.7 02/27/2013   MCV 85.1 02/27/2013   PLT 183 02/27/2013  Chemistry      Component Value Date/Time   NA 141 02/27/2013 0945   NA 144 02/10/2013 1245   K 3.9 02/27/2013 0945   K 4.0 02/10/2013 1245   CL 109* 02/27/2013 0945   CL 108 02/10/2013 1245   CO2 24 02/27/2013 0945   CO2 27 02/10/2013 1245   BUN 11.6 02/27/2013 0945   BUN 10 02/10/2013 1245   CREATININE 0.7 02/27/2013 0945   CREATININE 0.51 02/10/2013 1245      Component Value Date/Time   CALCIUM 9.4 02/27/2013 0945   CALCIUM 9.7 02/10/2013 1245   ALKPHOS 101 02/27/2013 0945   ALKPHOS 67 01/06/2013 2205   AST 11 02/27/2013 0945   AST 16 01/06/2013 2205   ALT 30 02/27/2013 0945   ALT 17 01/06/2013 2205   BILITOT 0.74 02/27/2013 0945   BILITOT 0.2* 01/06/2013 2205       RADIOGRAPHIC STUDIES:  Dg Chest Port 1 View  02/17/2013  *RADIOLOGY REPORT*  Clinical Data: Port placement  PORTABLE CHEST - 1 VIEW  Comparison: 09/11/2012  Findings: Port has been placed on the right, introduced from the subclavian vein.  The tip is in the SVC 4 cm above the right atrium.  No pneumothorax.  Lungs are clear.  IMPRESSION: Port well  positioned with its tip in the SVC 4 cm above the right atrium.  No complication evident.   Original Report Authenticated By: Paulina Fusi, M.D.    Dg Fluoro Guide Cv Line-no Report  02/17/2013  CLINICAL DATA: portacath placement   FLOURO GUIDE CV LINE  Fluoroscopy was utilized by the requesting physician.  No radiographic  interpretation.      ASSESSMENT: 62 year old female with   #1 new diagnosis of invasive ductal carcinoma that is triple negative measuring 1.4 cm node-negative. Patient is status post lumpectomy. Postoperatively she is doing well. Diagnosis code 174.2.  #2 patient and I discussed adjuvant therapy. She would be a candidate for adjuvant chemotherapy consisting of CMF given every 3 weeks for a total of 6 cycles. Risks and benefits of treatment were discussed with her. The dates of her chemotherapy include 4/24, 5/15, 6/5, 6/26, 7/17, 8/7.  She will also have appointments 7 days after her chemotherapy for labs and an MD/NP appointment for evaluation of chemotoxicities.   #3 right arm pain/swelling  PLAN:   1. Ms. Rys is doing well today.  She is doing well, her labs are stable.  I recommended sunscreen/hat use when outdoors, increased water intake, and short periods of exercise.    2.  I will see her back next week for labs and an appointment for evaluation of chemotoxicities.    #3 Ms. Mckiernan had a doppler of her right arm to r/o dvt.  It was negative.    All questions were answered. The patient knows to call the clinic with any problems, questions or concerns. We can certainly see the patient much sooner if necessary.  I spent 25 minutes counseling the patient face to face. The total time spent in the appointment was 30 minutes.  Cherie Ouch Lyn Hollingshead, NP Medical Oncology [facility] Phone: [phone] 02/27/2013, 10:58 AM

## 2013-02-27 NOTE — Progress Notes (Signed)
*  PRELIMINARY RESULTS* Vascular Ultrasound Right upper extremity venous duplex has been completed.  Preliminary findings: No evidence of deep or superficial thrombosis in the right upper extremity.   Attempted call report to Dr. Milta Deiters RN at [phone]. No answer. Left message with results.   Farrel Demark, RDMS, RVT  02/27/2013, 12:00 PM

## 2013-02-27 NOTE — Patient Instructions (Addendum)
Doing well.  Labs look good.  We will evaluate the right arm pain and swelling with a doppler.  Please call us if you have any questions or concerns.

## 2013-03-03 ENCOUNTER — Ambulatory Visit (INDEPENDENT_AMBULATORY_CARE_PROVIDER_SITE_OTHER): Payer: BC Managed Care – PPO | Admitting: Internal Medicine

## 2013-03-03 VITALS — BP 154/89 | HR 67 | Wt 148.0 lb

## 2013-03-03 DIAGNOSIS — I2510 Atherosclerotic heart disease of native coronary artery without angina pectoris: Secondary | ICD-10-CM

## 2013-03-03 MED ORDER — AMLODIPINE BESYLATE 2.5 MG PO TABS: 2.5000 mg | ORAL_TABLET | Freq: Every day | ORAL | Status: DC

## 2013-03-03 NOTE — Patient Instructions (Addendum)
Your physician has requested that you have an echocardiogram. Echocardiography is a painless test that uses sound waves to create images of your heart. It provides your doctor with information about the size and shape of your heart and how well your heart's chambers and valves are working. This procedure takes approximately one hour. There are no restrictions for this procedure.  Start Amlodipine 2.5mg  daily.  Have additional labs drawn at the cancer center.  Lipid Panel - see order  Your physician wants you to follow-up in: August 2014 with Dr. Tenny Craw.  You will receive a reminder letter in the mail two months in advance. If you don't receive a letter, please call our office to schedule the follow-up appointment.

## 2013-03-03 NOTE — Progress Notes (Signed)
HPI Patient is a 62 yo who presents for continued cardiac care  She was previously followed by T. Tresa Endo ([facility])  The patient has a hsitory of CAD  She presented for the first time to the hospital in November 2013 with RUQ pain.  No CP  No SOB  No sweats.  Was diagnosed with diabetes at that time. Patient with history of CAD RUQ  paind NO SOB  No sweats  No relation to food Went to [facility].  Tx to [facility] with STEMI  Cath showed: .LM: nl  LAD: 100% occlusion after Dx1 and Septal1 with Timi 0 flow; 80% ostial and Mid Dx; once LAD opened diffuse 80 - 90% mid LAD stenosis in a small vessel  LCX: nl  RCA: 50% mid, 80% distal before PDA takeoff  PTCA/Stent LAD at occluded site with 2.25 x 15 Xience DES stent to 2.30 mm;  PTCA to diffusey diseased mid LAD with long infations up to 2.14 maximally, not stented with TIMI 3 flow returned.  EF: 45 - 50% with mild anterolateral hypokinesis.  20% L renal narrowing  Post procedure the patient hand recurrent pain  Went back for second cardiac cath  This showed: Stable post PCI anatomy with diffuse distal LAD disease & a suggestion of small dissection distal to tiny D2. Patent LAD stent with TIMI 3 flow in all vessels.  Stable D2 lesions - in a 1.5-~2.0 mm vessel, unlikely cause of resting angina -- would defer to Dr. Tresa Endo, but not very good PCI targets. Stable RCA lesion - non-flow limiting.  The patient was discharged home on Ranexa  She has never filled due to cost.  Since d/c she has been seen in clinic No further intervention was done [facility] denies CP  No RUQ pain.  Breathing is stable  She is now starting therapy for breast CA  Plan for Chemo and XRT to localized L breast lesion.     No Known Allergies  Current Outpatient Prescriptions  Medication Sig Dispense Refill  . aspirin EC 81 MG EC tablet Take 1 tablet (81 mg total) by mouth daily.      Marland Kitchen atorvastatin (LIPITOR) 80 MG tablet Take 1 tablet (80 mg total) by mouth  daily at 6 PM.  30 tablet  5  . diazepam (VALIUM) 5 MG tablet Take 1 tablet (5 mg total) by mouth every 6 (six) hours as needed for anxiety.  60 tablet  1  . glucose blood (ONETOUCH VERIO) test strip Use TID  100 each  12  . ibuprofen (ADVIL,MOTRIN) 200 MG tablet Take 200 mg by mouth every 6 (six) hours as needed. For pain      . insulin aspart (NOVOLOG) 100 UNIT/ML injection Inject 10 Units into the skin 3 (three) times daily with meals.  1 vial  11  . insulin glargine (LANTUS SOLOSTAR) 100 UNIT/ML injection Inject 31 Units into the skin at bedtime.  9 mL  12  . isosorbide mononitrate (IMDUR) 60 MG 24 hr tablet Take 1 tablet (60 mg total) by mouth daily.  30 tablet  5  . lidocaine-prilocaine (EMLA) cream Apply topically as needed. Apply to port 1-2 hours before procedure  30 g  1  . lisinopril (PRINIVIL,ZESTRIL) 40 MG tablet Take 40 mg by mouth daily.      . metFORMIN (GLUCOPHAGE-XR) 750 MG 24 hr tablet Take 1 tablet (750 mg total) by mouth daily with breakfast.  30 tablet  5  . metoprolol (  LOPRESSOR) 50 MG tablet Take 125 mg by mouth 2 (two) times daily.      . nitroGLYCERIN (NITROSTAT) 0.4 MG SL tablet Place 0.4 mg under the tongue every 5 (five) minutes as needed for chest pain.      Marland Kitchen ondansetron (ZOFRAN) 8 MG tablet Take 1 tablet by mouth 2 times daily starting the day after chemo for 2 days, then take 2 times a day as needed for N/V  30 tablet  1  . oxyCODONE-acetaminophen (ROXICET) 5-325 MG per tablet Take 1 tablet by mouth every 4 (four) hours as needed for pain.  20 tablet  0  . ranolazine (RANEXA) 500 MG 12 hr tablet Take 1 tablet (500 mg total) by mouth 2 (two) times daily.  60 tablet  5  . Ticagrelor (BRILINTA) 90 MG TABS tablet Take 1 tablet (90 mg total) by mouth 2 (two) times daily.  60 tablet  11   No current facility-administered medications for this visit.    Past Medical History  Diagnosis Date  . HTN (hypertension)   . Diabetes mellitus without complication   . Stroke,  embolic   . Myocardial infarction 09/07/12  . Breast cancer     Past Surgical History  Procedure Laterality Date  . Coronary angioplasty with stent placement    . Breast lumpectomy with needle localization and axillary sentinel lymph node bx Left 01/08/2013    Procedure: LEFT BREAST WIRE GUIDED  LUMPECTOMY AND LEFT AXILLARY SENTINEL  NODE BX;  Surgeon: Emelia Loron, MD;  Location: [facility] OR;  Service: General;  Laterality: Left;  . Back surgery    . Portacath placement Right 02/17/2013    Procedure: INSERTION PORT-A-CATH;  Surgeon: Emelia Loron, MD;  Location: [facility];  Service: General;  Laterality: Right;    Family History  Problem Relation Age of Onset  . Cancer Neg Hx   . Diabetes Neg Hx   . Hyperlipidemia Neg Hx   . Hypertension Neg Hx   . Kidney disease Neg Hx   . Stroke Neg Hx     History   Social History  . Marital Status: Married    Spouse Name: N/A    Number of Children: N/A  . Years of Education: N/A   Occupational History  . Not on file.   Social History Main Topics  . Smoking status: Never Smoker   . Smokeless tobacco: Never Used  . Alcohol Use: No     Comment: none  . Drug Use: No  . Sexually Active: Yes   Other Topics Concern  . Not on file   Social History Narrative  . No narrative on file    Review of Systems:  All systems reviewed.  They are negative to the above problem except as previously stated.  Vital Signs: BP 154/89  Pulse 67  Wt 148 lb (67.132 kg)  BMI 24.63 kg/m2  Physical Exam Patient isin NAD HEENT:  Normocephalic, atraumatic. EOMI, PERRLA.  Neck: JVP is normal.  No bruits.  Lungs: clear to auscultation. No rales no wheezes.  Heart: Regular rate and rhythm. Normal S1, S2. No S3.   No significant murmurs. PMI not displaced.  Abdomen:  Supple, nontender. Normal bowel sounds. No masses. No hepatomegaly.  Extremities:   Good distal pulses throughout. No lower extremity edema.  Musculoskeletal :moving all extremities.   Neuro:   alert and oriented x3.  CN II-XII grossly intact.  EKG  SR 67  Anterolateral MI  Inferior MI  Assessment and Plan:  1.  CAD  Patient with first presentation last year.  Diffuse disease  Since intervention things have been quiet I would keep on same regimen  Without angina I would not start Ranexa.  I will review films  I would not plan any invasive Rx now since she is rel asymptomatic Would set up for echo  2.  Breast CA  Patient to undergo Rx   Will need to watch as counts drop to see if she develops angina.  Hopeflully if rescued that should not be for long periods  3.  HTN  BP has been running high  I would add low dose amlodipine. 2.5 mg  She will have this followed  4.  HL  Get fasting lipid when she goes to cancer center next  F/u in clinic in August.

## 2013-03-04 ENCOUNTER — Ambulatory Visit (HOSPITAL_COMMUNITY): Payer: BC Managed Care – PPO | Attending: Internal Medicine

## 2013-03-04 DIAGNOSIS — I2510 Atherosclerotic heart disease of native coronary artery without angina pectoris: Secondary | ICD-10-CM

## 2013-03-04 DIAGNOSIS — I10 Essential (primary) hypertension: Secondary | ICD-10-CM | POA: Insufficient documentation

## 2013-03-04 DIAGNOSIS — Z8673 Personal history of transient ischemic attack (TIA), and cerebral infarction without residual deficits: Secondary | ICD-10-CM | POA: Insufficient documentation

## 2013-03-04 DIAGNOSIS — Z853 Personal history of malignant neoplasm of breast: Secondary | ICD-10-CM | POA: Insufficient documentation

## 2013-03-04 DIAGNOSIS — R072 Precordial pain: Secondary | ICD-10-CM

## 2013-03-04 DIAGNOSIS — R079 Chest pain, unspecified: Secondary | ICD-10-CM | POA: Insufficient documentation

## 2013-03-04 DIAGNOSIS — E119 Type 2 diabetes mellitus without complications: Secondary | ICD-10-CM | POA: Insufficient documentation

## 2013-03-04 DIAGNOSIS — I252 Old myocardial infarction: Secondary | ICD-10-CM | POA: Insufficient documentation

## 2013-03-04 NOTE — Progress Notes (Signed)
Echocardiogram performed.  

## 2013-03-05 ENCOUNTER — Other Ambulatory Visit: Payer: Self-pay | Admitting: *Deleted

## 2013-03-12 ENCOUNTER — Other Ambulatory Visit: Payer: Self-pay | Admitting: Adult Health

## 2013-03-12 DIAGNOSIS — C50219 Malignant neoplasm of upper-inner quadrant of unspecified female breast: Secondary | ICD-10-CM

## 2013-03-13 ENCOUNTER — Other Ambulatory Visit (HOSPITAL_BASED_OUTPATIENT_CLINIC_OR_DEPARTMENT_OTHER): Payer: BC Managed Care – PPO | Admitting: Lab

## 2013-03-13 ENCOUNTER — Ambulatory Visit (HOSPITAL_BASED_OUTPATIENT_CLINIC_OR_DEPARTMENT_OTHER): Payer: BC Managed Care – PPO | Admitting: Adult Health

## 2013-03-13 ENCOUNTER — Telehealth: Payer: Self-pay | Admitting: Oncology

## 2013-03-13 ENCOUNTER — Encounter: Payer: Self-pay | Admitting: Adult Health

## 2013-03-13 ENCOUNTER — Ambulatory Visit (HOSPITAL_BASED_OUTPATIENT_CLINIC_OR_DEPARTMENT_OTHER): Payer: BC Managed Care – PPO

## 2013-03-13 VITALS — BP 166/85 | HR 60 | Temp 98.3°F | Resp 20 | Ht 65.0 in | Wt 146.1 lb

## 2013-03-13 DIAGNOSIS — C50919 Malignant neoplasm of unspecified site of unspecified female breast: Secondary | ICD-10-CM

## 2013-03-13 DIAGNOSIS — Z171 Estrogen receptor negative status [ER-]: Secondary | ICD-10-CM

## 2013-03-13 DIAGNOSIS — C50219 Malignant neoplasm of upper-inner quadrant of unspecified female breast: Secondary | ICD-10-CM

## 2013-03-13 DIAGNOSIS — Z5111 Encounter for antineoplastic chemotherapy: Secondary | ICD-10-CM

## 2013-03-13 DIAGNOSIS — C50212 Malignant neoplasm of upper-inner quadrant of left female breast: Secondary | ICD-10-CM

## 2013-03-13 LAB — COMPREHENSIVE METABOLIC PANEL (CC13)
ALT: 22 U/L (ref 0–55)
AST: 16 U/L (ref 5–34)
Albumin: 3.6 g/dL (ref 3.5–5.0)
Alkaline Phosphatase: 101 U/L (ref 40–150)
BUN: 9.9 mg/dL (ref 7.0–26.0)
CO2: 26 mEq/L (ref 22–29)
Calcium: 9.5 mg/dL (ref 8.4–10.4)
Chloride: 106 mEq/L (ref 98–107)
Creatinine: 0.8 mg/dL (ref 0.6–1.1)
Glucose: 110 mg/dl — ABNORMAL HIGH (ref 70–99)
Potassium: 4.3 mEq/L (ref 3.5–5.1)
Sodium: 141 mEq/L (ref 136–145)
Total Bilirubin: 0.61 mg/dL (ref 0.20–1.20)
Total Protein: 6.9 g/dL (ref 6.4–8.3)

## 2013-03-13 LAB — CBC WITH DIFFERENTIAL/PLATELET
BASO%: 0.6 % (ref 0.0–2.0)
Basophils Absolute: 0 10*3/uL (ref 0.0–0.1)
EOS%: 2.4 % (ref 0.0–7.0)
Eosinophils Absolute: 0.1 10*3/uL (ref 0.0–0.5)
HCT: 44.6 % (ref 34.8–46.6)
HGB: 15.3 g/dL (ref 11.6–15.9)
LYMPH%: 44.9 % (ref 14.0–49.7)
MCH: 29 pg (ref 25.1–34.0)
MCHC: 34.3 g/dL (ref 31.5–36.0)
MCV: 84.6 fL (ref 79.5–101.0)
MONO#: 0.6 10*3/uL (ref 0.1–0.9)
MONO%: 18.4 % — ABNORMAL HIGH (ref 0.0–14.0)
NEUT#: 1.1 10*3/uL — ABNORMAL LOW (ref 1.5–6.5)
NEUT%: 33.7 % — ABNORMAL LOW (ref 38.4–76.8)
Platelets: 265 10*3/uL (ref 145–400)
RBC: 5.27 10*6/uL (ref 3.70–5.45)
RDW: 13.6 % (ref 11.2–14.5)
WBC: 3.3 10*3/uL — ABNORMAL LOW (ref 3.9–10.3)
lymph#: 1.5 10*3/uL (ref 0.9–3.3)
nRBC: 0 % (ref 0–0)

## 2013-03-13 MED ORDER — FLUOROURACIL CHEMO INJECTION 2.5 GM/50ML
600.0000 mg/m2 | Freq: Once | INTRAVENOUS | Status: AC
  Administered 2013-03-13: 1050 mg via INTRAVENOUS
  Filled 2013-03-13: qty 21

## 2013-03-13 MED ORDER — METHOTREXATE SODIUM CHEMO INJECTION 25 MG/ML
40.0000 mg/m2 | Freq: Once | INTRAMUSCULAR | Status: AC
  Administered 2013-03-13: 70 mg via INTRAVENOUS
  Filled 2013-03-13: qty 2.8

## 2013-03-13 MED ORDER — HEPARIN SOD (PORK) LOCK FLUSH 100 UNIT/ML IV SOLN
500.0000 [IU] | Freq: Once | INTRAVENOUS | Status: AC | PRN
  Administered 2013-03-13: 500 [IU]
  Filled 2013-03-13: qty 5

## 2013-03-13 MED ORDER — SODIUM CHLORIDE 0.9 % IV SOLN
600.0000 mg/m2 | Freq: Once | INTRAVENOUS | Status: AC
  Administered 2013-03-13: 1040 mg via INTRAVENOUS
  Filled 2013-03-13: qty 52

## 2013-03-13 MED ORDER — DEXAMETHASONE SODIUM PHOSPHATE 10 MG/ML IJ SOLN
10.0000 mg | Freq: Once | INTRAMUSCULAR | Status: AC
  Administered 2013-03-13: 10 mg via INTRAVENOUS

## 2013-03-13 MED ORDER — LORAZEPAM 2 MG/ML IJ SOLN
0.5000 mg | Freq: Once | INTRAMUSCULAR | Status: AC
  Administered 2013-03-13: 0.5 mg via INTRAVENOUS

## 2013-03-13 MED ORDER — ONDANSETRON 8 MG/50ML IVPB (CHCC)
8.0000 mg | Freq: Once | INTRAVENOUS | Status: AC
  Administered 2013-03-13: 8 mg via INTRAVENOUS

## 2013-03-13 MED ORDER — SODIUM CHLORIDE 0.9 % IJ SOLN
10.0000 mL | INTRAMUSCULAR | Status: DC | PRN
  Administered 2013-03-13: 10 mL
  Filled 2013-03-13: qty 10

## 2013-03-13 MED ORDER — SODIUM CHLORIDE 0.9 % IV SOLN
Freq: Once | INTRAVENOUS | Status: AC
  Administered 2013-03-13: 11:00:00 via INTRAVENOUS

## 2013-03-13 NOTE — Telephone Encounter (Signed)
, °

## 2013-03-13 NOTE — Progress Notes (Signed)
OFFICE PROGRESS NOTE  CC**  [name], MD [address] 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   PRIOR THERAPY: #1in January 2014 patient was found to have a suspicious area on screening mammography in the upper outer quadrant of the left breast. She ultimately underwent biopsy of this area which revealed invasive ductal carcinoma. the tumor was estrogen and progesterone receptor negative as well as HER-2/neu negative MRI revealed a solitary mass measuring 1.6 x 1.5 x 1.5 cm in size. Patient was seen in the multidisciplinary breast clinic.  #2patient is status post left lumpectomy with sentinel lymph node biopsies. Her final pathology reveals a 1.4 cm invasive ductal carcinoma grade 3, ER negative PR negative HER-2/neu negative with Ki-67 54%. Sentinel lymph nodes were negative for metastatic disease.  #3 patient is recommended adjuvant chemotherapy consisting of CMFto be given every 3 weeks for a total of 6 cycles adjuvantly.I have recommended CMF since patient does have significant cardiac disease and recently had a myocardial infarction. She also has other neurologic disease as well.   CURRENT THERAPY: CMF cycle 2 day 1  INTERVAL HISTORY: Marisa Forbes 62 y.o. female returns for evaluation of her left breast triple negative invasive ductal carcinoma.  She is feeling well today.  She denies fevers, chills, nausea, vomiting, constipation, diarrhea, numbness.  She does have fatigue.  Otherwise, a 10 point ROs is neg.   MEDICAL HISTORY: Past Medical History  Diagnosis Date  . HTN (hypertension)   . Diabetes mellitus without complication   . Stroke, embolic   . Myocardial infarction 09/07/12  . Breast cancer     ALLERGIES:  has No Known Allergies.  MEDICATIONS:  Current Outpatient Prescriptions  Medication Sig Dispense  Refill  . amLODipine (NORVASC) 2.5 MG tablet Take 1 tablet (2.5 mg total) by mouth daily.  90 tablet  3  . aspirin EC 81 MG EC tablet Take 1 tablet (81 mg total) by mouth daily.      Marland Kitchen atorvastatin (LIPITOR) 80 MG tablet Take 1 tablet (80 mg total) by mouth daily at 6 PM.  30 tablet  5  . diazepam (VALIUM) 5 MG tablet Take 1 tablet (5 mg total) by mouth every 6 (six) hours as needed for anxiety.  60 tablet  1  . glucose blood (ONETOUCH VERIO) test strip Use TID  100 each  12  . ibuprofen (ADVIL,MOTRIN) 200 MG tablet Take 200 mg by mouth every 6 (six) hours as needed. For pain      . insulin aspart (NOVOLOG) 100 UNIT/ML injection Inject 10 Units into the skin 3 (three) times daily with meals.  1 vial  11  . insulin glargine (LANTUS SOLOSTAR) 100 UNIT/ML injection Inject 31 Units into the skin at bedtime.  9 mL  12  . isosorbide mononitrate (IMDUR) 60 MG 24 hr tablet Take 1 tablet (60 mg total) by mouth daily.  30 tablet  5  . lidocaine-prilocaine (EMLA) cream Apply topically as needed. Apply to port 1-2 hours before procedure  30 g  1  . lisinopril (PRINIVIL,ZESTRIL) 40 MG tablet Take 40 mg by mouth daily.      . metFORMIN (GLUCOPHAGE-XR) 750 MG 24 hr tablet Take 1 tablet (750 mg total) by mouth daily with breakfast.  30 tablet  5  . metoprolol (LOPRESSOR) 50 MG tablet Take 125  mg by mouth 2 (two) times daily.      . nitroGLYCERIN (NITROSTAT) 0.4 MG SL tablet Place 0.4 mg under the tongue every 5 (five) minutes as needed for chest pain.      Marland Kitchen ondansetron (ZOFRAN) 8 MG tablet Take 1 tablet by mouth 2 times daily starting the day after chemo for 2 days, then take 2 times a day as needed for N/V  30 tablet  1  . oxyCODONE-acetaminophen (ROXICET) 5-325 MG per tablet Take 1 tablet by mouth every 4 (four) hours as needed for pain.  20 tablet  0  . Ticagrelor (BRILINTA) 90 MG TABS tablet Take 1 tablet (90 mg total) by mouth 2 (two) times daily.  60 tablet  11   No current facility-administered  medications for this visit.    SURGICAL HISTORY:  Past Surgical History  Procedure Laterality Date  . Coronary angioplasty with stent placement    . Breast lumpectomy with needle localization and axillary sentinel lymph node bx Left 01/08/2013    Procedure: LEFT BREAST WIRE GUIDED  LUMPECTOMY AND LEFT AXILLARY SENTINEL  NODE BX;  Surgeon: Emelia Loron, MD;  Location: [facility] OR;  Service: General;  Laterality: Left;  . Back surgery    . Portacath placement Right 02/17/2013    Procedure: INSERTION PORT-A-CATH;  Surgeon: Emelia Loron, MD;  Location: [facility] OR;  Service: General;  Laterality: Right;    REVIEW OF SYSTEMS:  General: fatigue (+), night sweats (-), fever (-), pain (-) Lymph: palpable nodes (-) HEENT: vision changes (-), mucositis (-), gum bleeding (-), epistaxis (-) Cardiovascular: chest pain (-), palpitations (-) Pulmonary: shortness of breath (-), dyspnea on exertion (-), cough (-), hemoptysis (-) GI:  Early satiety (-), melena (-), dysphagia (-), nausea/vomiting (-), diarrhea (-) GU: dysuria (-), hematuria (-), incontinence (-) Musculoskeletal: joint swelling (-), joint pain (-), back pain (-) Neuro: weakness (+), numbness (-), headache (-), confusion (-) Skin: Rash (-), lesions (-), dryness (-) Psych: depression (-), suicidal/homicidal ideation (-), feeling of hopelessness (-)   PHYSICAL EXAMINATION: Blood pressure 166/85, pulse 60, temperature 98.3 F (36.8 C), temperature source Oral, resp. rate 20, height 5\' 5"  (1.651 m), weight 146 lb 1.6 oz (66.271 kg). Body mass index is 24.31 kg/(m^2). General: Patient is a well appearing female in no acute distress HEENT: PERRLA, sclerae anicteric no conjunctival pallor, MMM Neck: supple, no palpable adenopathy Lungs: clear to auscultation bilaterally, no wheezes, rhonchi, or rales Cardiovascular: regular rate rhythm, S1, S2, no murmurs, rubs or gallops Abdomen: Soft, non-tender, non-distended, normoactive bowel sounds, no  HSM Extremities: warm and well perfused, no clubbing, cyanosis, mild right arm swelling Skin: No rashes or lesions Neuro: Non-focal Breasts: left lumpectomy site healing well, does have mild swelling to the left breast--which has improved, also, right breast without masses or nodularity.  Right port ecchymosis has resolved.   ECOG PERFORMANCE STATUS: 0 - Asymptomatic  LABORATORY DATA: Lab Results  Component Value Date   WBC 3.3* 03/13/2013   HGB 15.3 03/13/2013   HCT 44.6 03/13/2013   MCV 84.6 03/13/2013   PLT 265 03/13/2013      Chemistry      Component Value Date/Time   NA 141 03/13/2013 0959   NA 144 02/10/2013 1245   K 4.3 03/13/2013 0959   K 4.0 02/10/2013 1245   CL 106 03/13/2013 0959   CL 108 02/10/2013 1245   CO2 26 03/13/2013 0959   CO2 27 02/10/2013 1245   BUN 9.9 03/13/2013 0959   BUN  10 02/10/2013 1245   CREATININE 0.8 03/13/2013 0959   CREATININE 0.51 02/10/2013 1245      Component Value Date/Time   CALCIUM 9.5 03/13/2013 0959   CALCIUM 9.7 02/10/2013 1245   ALKPHOS 101 03/13/2013 0959   ALKPHOS 67 01/06/2013 2205   AST 16 03/13/2013 0959   AST 16 01/06/2013 2205   ALT 22 03/13/2013 0959   ALT 17 01/06/2013 2205   BILITOT 0.61 03/13/2013 0959   BILITOT 0.2* 01/06/2013 2205       RADIOGRAPHIC STUDIES:  Dg Chest Port 1 View  02/17/2013  *RADIOLOGY REPORT*  Clinical Data: Port placement  PORTABLE CHEST - 1 VIEW  Comparison: 09/11/2012  Findings: Port has been placed on the right, introduced from the subclavian vein.  The tip is in the SVC 4 cm above the right atrium.  No pneumothorax.  Lungs are clear.  IMPRESSION: Port well positioned with its tip in the SVC 4 cm above the right atrium.  No complication evident.   Original Report Authenticated By: Paulina Fusi, M.D.    Dg Fluoro Guide Cv Line-no Report  02/17/2013  CLINICAL DATA: portacath placement   FLOURO GUIDE CV LINE  Fluoroscopy was utilized by the requesting physician.  No radiographic  interpretation.      ASSESSMENT:  62 year old female with   #1 new diagnosis of invasive ductal carcinoma that is triple negative measuring 1.4 cm node-negative. Patient is status post lumpectomy. Postoperatively she is doing well. Diagnosis code 174.2.  #2 patient and I discussed adjuvant therapy. She would be a candidate for adjuvant chemotherapy consisting of CMF given every 3 weeks for a total of 6 cycles. Risks and benefits of treatment were discussed with her. The dates of her chemotherapy include 4/24, 5/15, 6/5, 6/26, 7/17, 8/7.  She will also have appointments 7 days after her chemotherapy for labs and an MD/NP appointment for evaluation of chemotoxicities.    PLAN:   1. Ms. Jahn is doing well today.  Her labs are stable.  Her WBC is slightly decreased.  She will need to receive Neulasta tomorrow. I counseled her on Neulasta, its indications and adverse effects.     2.  I will see her back next week for labs and an appointment for evaluation of chemotoxicities.    All questions were answered. The patient knows to call the clinic with any problems, questions or concerns. We can certainly see the patient much sooner if necessary.  I spent 25 minutes counseling the patient face to face. The total time spent in the appointment was 30 minutes.  Cherie Ouch Lyn Hollingshead, NP Medical Oncology [facility] Phone: [phone] 03/13/2013, 5:32 PM

## 2013-03-13 NOTE — Patient Instructions (Signed)
Doing well.  Proceed with chemotherapy.  We will give you Neulasta tomorrow to help prevent your white blood cells from getting to low and putting you at risk for infection.    Please take Claritin 10mg  daily, and tylenol or ibuprofen if needed.    Pegfilgrastim injection What is this medicine? PEGFILGRASTIM (peg fil GRA stim) helps the body make more white blood cells. It is used to prevent infection in people with low amounts of white blood cells following cancer treatment. This medicine may be used for other purposes; ask your health care provider or pharmacist if you have questions. What should I tell my health care provider before I take this medicine? They need to know if you have any of these conditions: -sickle cell disease -an unusual or allergic reaction to pegfilgrastim, filgrastim, E.coli protein, other medicines, foods, dyes, or preservatives -pregnant or trying to get pregnant -breast-feeding How should I use this medicine? This medicine is for injection under the skin. It is usually given by a health care professional in a hospital or clinic setting. If you get this medicine at home, you will be taught how to prepare and give this medicine. Do not shake this medicine. Use exactly as directed. Take your medicine at regular intervals. Do not take your medicine more often than directed. It is important that you put your used needles and syringes in a special sharps container. Do not put them in a trash can. If you do not have a sharps container, call your pharmacist or healthcare provider to get one. Talk to your pediatrician regarding the use of this medicine in children. While this drug may be prescribed for children who weigh more than 45 kg for selected conditions, precautions do apply Overdosage: If you think you have taken too much of this medicine contact a poison control center or emergency room at once. NOTE: This medicine is only for you. Do not share this medicine with  others. What if I miss a dose? If you miss a dose, take it as soon as you can. If it is almost time for your next dose, take only that dose. Do not take double or extra doses. What may interact with this medicine? -lithium -medicines for growth therapy This list may not describe all possible interactions. Give your health care provider a list of all the medicines, herbs, non-prescription drugs, or dietary supplements you use. Also tell them if you smoke, drink alcohol, or use illegal drugs. Some items may interact with your medicine. What should I watch for while using this medicine? Visit your doctor for regular check ups. You will need important blood work done while you are taking this medicine. What side effects may I notice from receiving this medicine? Side effects that you should report to your doctor or health care professional as soon as possible: -allergic reactions like skin rash, itching or hives, swelling of the face, lips, or tongue -breathing problems -fever -pain, redness, or swelling where injected -shoulder pain -stomach or side pain Side effects that usually do not require medical attention (report to your doctor or health care professional if they continue or are bothersome): -aches, pains -headache -loss of appetite -nausea, vomiting -unusually tired This list may not describe all possible side effects. Call your doctor for medical advice about side effects. You may report side effects to FDA at 1-800-FDA-1088. Where should I keep my medicine? Keep out of the reach of children. Store in a refrigerator between 2 and 8 degrees C (36  and 46 degrees F). Do not freeze. Keep in carton to protect from light. Throw away this medicine if it is left out of the refrigerator for more than 48 hours. Throw away any unused medicine after the expiration date. NOTE: This sheet is a summary. It may not cover all possible information. If you have questions about this medicine, talk to  your doctor, pharmacist, or health care provider.  2013, [employer]. (05/18/2008 3:41:44 PM)

## 2013-03-13 NOTE — Patient Instructions (Signed)
[facility] Discharge Instructions for Patients Receiving Chemotherapy  Today you received the following chemotherapy agents Cytoxan/Methotrexate/5 FU To help prevent nausea and vomiting after your treatment, we encourage you to take your nausea medication as prescribed.If you develop nausea and vomiting that is not controlled by your nausea medication, call the clinic. If it is after clinic hours your family physician or the after hours number for the clinic or go to the Emergency Department.   BELOW ARE SYMPTOMS THAT SHOULD BE REPORTED IMMEDIATELY:  *FEVER GREATER THAN 100.5 F  *CHILLS WITH OR WITHOUT FEVER  NAUSEA AND VOMITING THAT IS NOT CONTROLLED WITH YOUR NAUSEA MEDICATION  *UNUSUAL SHORTNESS OF BREATH  *UNUSUAL BRUISING OR BLEEDING  TENDERNESS IN MOUTH AND THROAT WITH OR WITHOUT PRESENCE OF ULCERS  *URINARY PROBLEMS  *BOWEL PROBLEMS  UNUSUAL RASH Items with * indicate a potential emergency and should be followed up as soon as possible.  Feel free to call the clinic you have any questions or concerns. The clinic phone number is [phone].   I have been informed and understand all the instructions given to me. I know to contact the clinic, my physician, or go to the Emergency Department if any problems should occur. I do not have any questions at this time, but understand that I may call the clinic during office hours   should I have any questions or need assistance in obtaining follow up care.    __________________________________________  _____________  __________ Signature of Patient or Authorized Representative            Date                   Time    __________________________________________ Nurse's Signature

## 2013-03-13 NOTE — Progress Notes (Signed)
Ok to treat with ANC 1.1 per Augustin Schooling, NP

## 2013-03-14 ENCOUNTER — Ambulatory Visit: Payer: BC Managed Care – PPO

## 2013-03-14 ENCOUNTER — Ambulatory Visit (HOSPITAL_BASED_OUTPATIENT_CLINIC_OR_DEPARTMENT_OTHER): Payer: BC Managed Care – PPO

## 2013-03-14 VITALS — BP 161/73 | HR 57 | Temp 98.1°F

## 2013-03-14 DIAGNOSIS — C50919 Malignant neoplasm of unspecified site of unspecified female breast: Secondary | ICD-10-CM

## 2013-03-14 DIAGNOSIS — C50219 Malignant neoplasm of upper-inner quadrant of unspecified female breast: Secondary | ICD-10-CM

## 2013-03-14 DIAGNOSIS — Z5189 Encounter for other specified aftercare: Secondary | ICD-10-CM

## 2013-03-14 MED ORDER — PEGFILGRASTIM INJECTION 6 MG/0.6ML
6.0000 mg | Freq: Once | SUBCUTANEOUS | Status: AC
  Administered 2013-03-14: 6 mg via SUBCUTANEOUS
  Filled 2013-03-14: qty 0.6

## 2013-03-19 ENCOUNTER — Encounter: Payer: Self-pay | Admitting: Adult Health

## 2013-03-19 ENCOUNTER — Ambulatory Visit (HOSPITAL_BASED_OUTPATIENT_CLINIC_OR_DEPARTMENT_OTHER): Payer: BC Managed Care – PPO | Admitting: Adult Health

## 2013-03-19 ENCOUNTER — Other Ambulatory Visit (HOSPITAL_BASED_OUTPATIENT_CLINIC_OR_DEPARTMENT_OTHER): Payer: BC Managed Care – PPO

## 2013-03-19 VITALS — BP 170/90 | HR 64 | Temp 98.0°F | Resp 20 | Ht 65.0 in | Wt 145.8 lb

## 2013-03-19 DIAGNOSIS — C50219 Malignant neoplasm of upper-inner quadrant of unspecified female breast: Secondary | ICD-10-CM

## 2013-03-19 DIAGNOSIS — Z171 Estrogen receptor negative status [ER-]: Secondary | ICD-10-CM

## 2013-03-19 DIAGNOSIS — C50919 Malignant neoplasm of unspecified site of unspecified female breast: Secondary | ICD-10-CM

## 2013-03-19 DIAGNOSIS — R109 Unspecified abdominal pain: Secondary | ICD-10-CM

## 2013-03-19 DIAGNOSIS — C50212 Malignant neoplasm of upper-inner quadrant of left female breast: Secondary | ICD-10-CM

## 2013-03-19 DIAGNOSIS — K1370 Unspecified lesions of oral mucosa: Secondary | ICD-10-CM

## 2013-03-19 LAB — COMPREHENSIVE METABOLIC PANEL (CC13)
ALT: 13 U/L (ref 0–55)
AST: 10 U/L (ref 5–34)
Albumin: 3.6 g/dL (ref 3.5–5.0)
Alkaline Phosphatase: 139 U/L (ref 40–150)
BUN: 9.6 mg/dL (ref 7.0–26.0)
CO2: 24 mEq/L (ref 22–29)
Calcium: 9.1 mg/dL (ref 8.4–10.4)
Chloride: 108 mEq/L — ABNORMAL HIGH (ref 98–107)
Creatinine: 0.6 mg/dL (ref 0.6–1.1)
Glucose: 147 mg/dl — ABNORMAL HIGH (ref 70–99)
Potassium: 3.8 mEq/L (ref 3.5–5.1)
Sodium: 140 mEq/L (ref 136–145)
Total Bilirubin: 1.04 mg/dL (ref 0.20–1.20)
Total Protein: 6.4 g/dL (ref 6.4–8.3)

## 2013-03-19 LAB — CBC WITH DIFFERENTIAL/PLATELET
BASO%: 0.6 % (ref 0.0–2.0)
Basophils Absolute: 0 10*3/uL (ref 0.0–0.1)
EOS%: 2 % (ref 0.0–7.0)
Eosinophils Absolute: 0.1 10*3/uL (ref 0.0–0.5)
HCT: 43.8 % (ref 34.8–46.6)
HGB: 14.7 g/dL (ref 11.6–15.9)
LYMPH%: 19.1 % (ref 14.0–49.7)
MCH: 28.9 pg (ref 25.1–34.0)
MCHC: 33.7 g/dL (ref 31.5–36.0)
MCV: 85.7 fL (ref 79.5–101.0)
MONO#: 0.3 10*3/uL (ref 0.1–0.9)
MONO%: 4.9 % (ref 0.0–14.0)
NEUT#: 3.8 10*3/uL (ref 1.5–6.5)
NEUT%: 73.4 % (ref 38.4–76.8)
Platelets: 103 10*3/uL — ABNORMAL LOW (ref 145–400)
RBC: 5.11 10*6/uL (ref 3.70–5.45)
RDW: 14 % (ref 11.2–14.5)
WBC: 5.2 10*3/uL (ref 3.9–10.3)
lymph#: 1 10*3/uL (ref 0.9–3.3)

## 2013-03-19 NOTE — Patient Instructions (Addendum)
Doing well.  Labs are stable.  Use biotene rinses for your mouth.  Please call us if you have any questions or concerns.

## 2013-03-19 NOTE — Progress Notes (Signed)
OFFICE PROGRESS NOTE  CC**  [name], MD [address] [facility] [address] 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   PRIOR THERAPY:  #1in January 2014 patient was found to have a suspicious area on screening mammography in the upper outer quadrant of the left breast. She ultimately underwent biopsy of this area which revealed invasive ductal carcinoma. the tumor was estrogen and progesterone receptor negative as well as HER-2/neu negative MRI revealed a solitary mass measuring 1.6 x 1.5 x 1.5 cm in size. Patient was seen in the multidisciplinary breast clinic.   #2patient is status post left lumpectomy with sentinel lymph node biopsies. Her final pathology reveals a 1.4 cm invasive ductal carcinoma grade 3, ER negative PR negative HER-2/neu negative with Ki-67 54%. Sentinel lymph nodes were negative for metastatic disease.   #3 patient is recommended adjuvant chemotherapy consisting of CMFto be given every 3 weeks for a total of 6 cycles adjuvantly.I have recommended CMF since patient does have significant cardiac disease and recently had a myocardial infarction. She also has other neurologic disease as well.   CURRENT THERAPY: CMF cycle 2 day 8  INTERVAL HISTORY: Marisa Forbes 62 y.o. female returns for evaluation of her left breast triple negative invasive ductal carcinoma.  She is here for eval after cycle two of her adjuvant chemotherapy.  She and her husband are very argumentative.  I began to tell the patient about her labs and he commented that she was very irritable and she got up and walked out of the appointment.  About 10 minutes later she returned and apologized.  Marisa Forbes is feeling physically well today. She has some mild abdominal cramping that is improved, it was worse over the weekend.  She also has some soreness in her mouth and  doesn't know if she should try some Biotene.  Otherwise a 10 point ROS is neg.  She is visibly upset.       MEDICAL HISTORY: Past Medical History  Diagnosis Date  . HTN (hypertension)   . Diabetes mellitus without complication   . Stroke, embolic   . Myocardial infarction 09/07/12  . Breast cancer     ALLERGIES:  has No Known Allergies.  MEDICATIONS:  Current Outpatient Prescriptions  Medication Sig Dispense Refill  . amLODipine (NORVASC) 2.5 MG tablet Take 1 tablet (2.5 mg total) by mouth daily.  90 tablet  3  . aspirin EC 81 MG EC tablet Take 1 tablet (81 mg total) by mouth daily.      Marland Kitchen atorvastatin (LIPITOR) 80 MG tablet Take 1 tablet (80 mg total) by mouth daily at 6 PM.  30 tablet  5  . diazepam (VALIUM) 5 MG tablet Take 1 tablet (5 mg total) by mouth every 6 (six) hours as needed for anxiety.  60 tablet  1  . glucose blood (ONETOUCH VERIO) test strip Use TID  100 each  12  . ibuprofen (ADVIL,MOTRIN) 200 MG tablet Take 200 mg by mouth every 6 (six) hours as needed. For pain      . insulin aspart (NOVOLOG) 100 UNIT/ML injection Inject 10 Units into the skin 3 (three) times daily with meals.  1 vial  11  . insulin glargine (LANTUS SOLOSTAR) 100 UNIT/ML injection Inject 31 Units into the skin at bedtime.  9 mL  12  . isosorbide mononitrate (IMDUR) 60  MG 24 hr tablet Take 1 tablet (60 mg total) by mouth daily.  30 tablet  5  . lidocaine-prilocaine (EMLA) cream Apply topically as needed. Apply to port 1-2 hours before procedure  30 g  1  . lisinopril (PRINIVIL,ZESTRIL) 40 MG tablet Take 40 mg by mouth daily.      . metFORMIN (GLUCOPHAGE-XR) 750 MG 24 hr tablet Take 1 tablet (750 mg total) by mouth daily with breakfast.  30 tablet  5  . metoprolol (LOPRESSOR) 50 MG tablet Take 125 mg by mouth 2 (two) times daily.      . nitroGLYCERIN (NITROSTAT) 0.4 MG SL tablet Place 0.4 mg under the tongue every 5 (five) minutes as needed for chest pain.      Marland Kitchen ondansetron (ZOFRAN) 8 MG tablet Take 1  tablet by mouth 2 times daily starting the day after chemo for 2 days, then take 2 times a day as needed for N/V  30 tablet  1  . oxyCODONE-acetaminophen (ROXICET) 5-325 MG per tablet Take 1 tablet by mouth every 4 (four) hours as needed for pain.  20 tablet  0  . Ticagrelor (BRILINTA) 90 MG TABS tablet Take 1 tablet (90 mg total) by mouth 2 (two) times daily.  60 tablet  11   No current facility-administered medications for this visit.    SURGICAL HISTORY:  Past Surgical History  Procedure Laterality Date  . Coronary angioplasty with stent placement    . Breast lumpectomy with needle localization and axillary sentinel lymph node bx Left 01/08/2013    Procedure: LEFT BREAST WIRE GUIDED  LUMPECTOMY AND LEFT AXILLARY SENTINEL  NODE BX;  Surgeon: Emelia Loron, MD;  Location: [facility] OR;  Service: General;  Laterality: Left;  . Back surgery    . Portacath placement Right 02/17/2013    Procedure: INSERTION PORT-A-CATH;  Surgeon: Emelia Loron, MD;  Location: [facility] OR;  Service: General;  Laterality: Right;    REVIEW OF SYSTEMS:  General: fatigue (+), night sweats (-), fever (-), pain (-) Lymph: palpable nodes (-) HEENT: vision changes (-), mucositis (-), gum bleeding (-), epistaxis (-) Cardiovascular: chest pain (-), palpitations (-) Pulmonary: shortness of breath (-), dyspnea on exertion (-), cough (-), hemoptysis (-) GI:  Early satiety (-), melena (-), dysphagia (-), nausea/vomiting (-), diarrhea (-) GU: dysuria (-), hematuria (-), incontinence (-) Musculoskeletal: joint swelling (-), joint pain (-), back pain (-) Neuro: weakness (+), numbness (-), headache (-), confusion (-) Skin: Rash (-), lesions (-), dryness (-) Psych: depression (-), suicidal/homicidal ideation (-), feeling of hopelessness (-)   PHYSICAL EXAMINATION: Blood pressure 170/90, pulse 64, temperature 98 F (36.7 C), temperature source Oral, resp. rate 20, height 5\' 5"  (1.651 m), weight 145 lb 12.8 oz (66.134 kg). Body  mass index is 24.26 kg/(m^2). General: Patient is a well appearing female in no acute distress HEENT: PERRLA, sclerae anicteric no conjunctival pallor, MMM Neck: supple, no palpable adenopathy Lungs: clear to auscultation bilaterally, no wheezes, rhonchi, or rales Cardiovascular: regular rate rhythm, S1, S2, no murmurs, rubs or gallops Abdomen: Soft, non-tender, non-distended, normoactive bowel sounds, no HSM Extremities: warm and well perfused, no clubbing, cyanosis, mild right arm swelling Skin: No rashes or lesions Neuro: Non-focal Breasts: left lumpectomy site healing well, does have mild swelling to the left breast--which has improved, also, right breast without masses or nodularity.  Right port ecchymosis has resolved.   ECOG PERFORMANCE STATUS: 0 - Asymptomatic  LABORATORY DATA: Lab Results  Component Value Date   WBC 5.2 03/19/2013  HGB 14.7 03/19/2013   HCT 43.8 03/19/2013   MCV 85.7 03/19/2013   PLT 103* 03/19/2013      Chemistry      Component Value Date/Time   NA 140 03/19/2013 1421   NA 144 02/10/2013 1245   K 3.8 03/19/2013 1421   K 4.0 02/10/2013 1245   CL 108* 03/19/2013 1421   CL 108 02/10/2013 1245   CO2 24 03/19/2013 1421   CO2 27 02/10/2013 1245   BUN 9.6 03/19/2013 1421   BUN 10 02/10/2013 1245   CREATININE 0.6 03/19/2013 1421   CREATININE 0.51 02/10/2013 1245      Component Value Date/Time   CALCIUM 9.1 03/19/2013 1421   CALCIUM 9.7 02/10/2013 1245   ALKPHOS 139 03/19/2013 1421   ALKPHOS 67 01/06/2013 2205   AST 10 03/19/2013 1421   AST 16 01/06/2013 2205   ALT 13 03/19/2013 1421   ALT 17 01/06/2013 2205   BILITOT 1.04 03/19/2013 1421   BILITOT 0.2* 01/06/2013 2205       RADIOGRAPHIC STUDIES:  Dg Chest Port 1 View  02/17/2013  *RADIOLOGY REPORT*  Clinical Data: Port placement  PORTABLE CHEST - 1 VIEW  Comparison: 09/11/2012  Findings: Port has been placed on the right, introduced from the subclavian vein.  The tip is in the SVC 4 cm above the right atrium.  No  pneumothorax.  Lungs are clear.  IMPRESSION: Port well positioned with its tip in the SVC 4 cm above the right atrium.  No complication evident.   Original Report Authenticated By: Paulina Fusi, M.D.    Dg Fluoro Guide Cv Line-no Report  02/17/2013  CLINICAL DATA: portacath placement   FLOURO GUIDE CV LINE  Fluoroscopy was utilized by the requesting physician.  No radiographic  interpretation.      ASSESSMENT: 61 year old female with   #1 new diagnosis of invasive ductal carcinoma that is triple negative measuring 1.4 cm node-negative. Patient is status post lumpectomy. Postoperatively she is doing well. Diagnosis code 174.2.  #2 patient and I discussed adjuvant therapy. She would be a candidate for adjuvant chemotherapy consisting of CMF given every 3 weeks for a total of 6 cycles. Risks and benefits of treatment were discussed with her. The dates of her chemotherapy include 4/24, 5/15, 6/5, 6/26, 7/17, 8/7.  She will also have appointments 7 days after her chemotherapy for labs and an MD/NP appointment for evaluation of chemotoxicities.    PLAN:   1. Ms. Glendening is doing well today.  Her labs are stable.She will use biotene for the mouth ulcerations and gas x for the abdominal cramps.    2.  I will see her back in two weeks prior to cycle 3 of CMF.    All questions were answered. The patient knows to call the clinic with any problems, questions or concerns. We can certainly see the patient much sooner if necessary.  I spent 25 minutes counseling the patient face to face. The total time spent in the appointment was 30 minutes.  Cherie Ouch Lyn Hollingshead, NP Medical Oncology [facility] Phone: [phone] 03/19/2013, 3:21 PM

## 2013-04-02 ENCOUNTER — Other Ambulatory Visit (HOSPITAL_BASED_OUTPATIENT_CLINIC_OR_DEPARTMENT_OTHER): Payer: BC Managed Care – PPO | Admitting: Lab

## 2013-04-02 ENCOUNTER — Ambulatory Visit (HOSPITAL_BASED_OUTPATIENT_CLINIC_OR_DEPARTMENT_OTHER): Payer: BC Managed Care – PPO | Admitting: Adult Health

## 2013-04-02 ENCOUNTER — Other Ambulatory Visit (HOSPITAL_COMMUNITY): Payer: Self-pay | Admitting: Physician Assistant

## 2013-04-02 ENCOUNTER — Encounter: Payer: Self-pay | Admitting: Adult Health

## 2013-04-02 VITALS — BP 155/80 | HR 66 | Temp 98.5°F | Resp 20 | Ht 65.0 in | Wt 147.4 lb

## 2013-04-02 DIAGNOSIS — C50219 Malignant neoplasm of upper-inner quadrant of unspecified female breast: Secondary | ICD-10-CM

## 2013-04-02 DIAGNOSIS — Z171 Estrogen receptor negative status [ER-]: Secondary | ICD-10-CM

## 2013-04-02 DIAGNOSIS — I252 Old myocardial infarction: Secondary | ICD-10-CM

## 2013-04-02 DIAGNOSIS — C50919 Malignant neoplasm of unspecified site of unspecified female breast: Secondary | ICD-10-CM

## 2013-04-02 DIAGNOSIS — I10 Essential (primary) hypertension: Secondary | ICD-10-CM

## 2013-04-02 DIAGNOSIS — C50212 Malignant neoplasm of upper-inner quadrant of left female breast: Secondary | ICD-10-CM

## 2013-04-02 LAB — COMPREHENSIVE METABOLIC PANEL (CC13)
ALT: 26 U/L (ref 0–55)
AST: 19 U/L (ref 5–34)
Albumin: 3.6 g/dL (ref 3.5–5.0)
Alkaline Phosphatase: 102 U/L (ref 40–150)
BUN: 11.3 mg/dL (ref 7.0–26.0)
CO2: 26 mEq/L (ref 22–29)
Calcium: 9.6 mg/dL (ref 8.4–10.4)
Chloride: 109 mEq/L — ABNORMAL HIGH (ref 98–107)
Creatinine: 0.7 mg/dL (ref 0.6–1.1)
Glucose: 91 mg/dl (ref 70–99)
Potassium: 4 mEq/L (ref 3.5–5.1)
Sodium: 143 mEq/L (ref 136–145)
Total Bilirubin: 0.91 mg/dL (ref 0.20–1.20)
Total Protein: 6.3 g/dL — ABNORMAL LOW (ref 6.4–8.3)

## 2013-04-02 LAB — CBC WITH DIFFERENTIAL/PLATELET
BASO%: 0.2 % (ref 0.0–2.0)
Basophils Absolute: 0 10*3/uL (ref 0.0–0.1)
EOS%: 0.3 % (ref 0.0–7.0)
Eosinophils Absolute: 0 10*3/uL (ref 0.0–0.5)
HCT: 41.6 % (ref 34.8–46.6)
HGB: 14.1 g/dL (ref 11.6–15.9)
LYMPH%: 6.1 % — ABNORMAL LOW (ref 14.0–49.7)
MCH: 28.7 pg (ref 25.1–34.0)
MCHC: 33.8 g/dL (ref 31.5–36.0)
MCV: 85.1 fL (ref 79.5–101.0)
MONO#: 0.7 10*3/uL (ref 0.1–0.9)
MONO%: 4.2 % (ref 0.0–14.0)
NEUT#: 15 10*3/uL — ABNORMAL HIGH (ref 1.5–6.5)
NEUT%: 89.2 % — ABNORMAL HIGH (ref 38.4–76.8)
Platelets: 197 10*3/uL (ref 145–400)
RBC: 4.89 10*6/uL (ref 3.70–5.45)
RDW: 15.3 % — ABNORMAL HIGH (ref 11.2–14.5)
WBC: 16.8 10*3/uL — ABNORMAL HIGH (ref 3.9–10.3)
lymph#: 1 10*3/uL (ref 0.9–3.3)

## 2013-04-02 MED ORDER — OXYCODONE-ACETAMINOPHEN 5-325 MG PO TABS: 1.0000 | ORAL_TABLET | ORAL | Status: DC | PRN

## 2013-04-02 NOTE — Patient Instructions (Signed)
Doing well.  Proceed with chemotherapy tomorrow.  Please call us if you have any questions or concerns.

## 2013-04-02 NOTE — Progress Notes (Signed)
OFFICE PROGRESS NOTE  CC**  [name], MD [address] [facility] [address] 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   PRIOR THERAPY:  #1in January 2014 patient was found to have a suspicious area on screening mammography in the upper outer quadrant of the left breast. She ultimately underwent biopsy of this area which revealed invasive ductal carcinoma. the tumor was estrogen and progesterone receptor negative as well as HER-2/neu negative MRI revealed a solitary mass measuring 1.6 x 1.5 x 1.5 cm in size. Patient was seen in the multidisciplinary breast clinic.   #2patient is status post left lumpectomy with sentinel lymph node biopsies. Her final pathology reveals a 1.4 cm invasive ductal carcinoma grade 3, ER negative PR negative HER-2/neu negative with Ki-67 54%. Sentinel lymph nodes were negative for metastatic disease.   #3 patient is recommended adjuvant chemotherapy consisting of CMFto be given every 3 weeks for a total of 6 cycles adjuvantly.I have recommended CMF since patient does have significant cardiac disease and recently had a myocardial infarction. She also has other neurologic disease as well.  She started CMF of 02/20/13.    CURRENT THERAPY: CMF cycle 3 day 1  INTERVAL HISTORY: Marisa Forbes 62 y.o. female returns for evaluation of her left breast triple negative invasive ductal carcinoma.  She is here for eval prior to cycle 3 of her adjuvant chemotherapy.  She is doing well today.  She is fatigued. She denies fevers, chills, nausea, vomiting, constipation, diarrhea, numbness.  She did have an episode of chest pain 2 weeks ago at [employer] while camping.  She went to the local ER and was found to have elevated blood pressure that the physician said was caused by chemotherapy.  Today, her BP is 155/80.  She denies headaches, vision  changes, dizziness.    MEDICAL HISTORY: Past Medical History  Diagnosis Date  . HTN (hypertension)   . Diabetes mellitus without complication   . Stroke, embolic   . Myocardial infarction 09/07/12  . Breast cancer     ALLERGIES:  has No Known Allergies.  MEDICATIONS:  Current Outpatient Prescriptions  Medication Sig Dispense Refill  . amLODipine (NORVASC) 2.5 MG tablet Take 1 tablet (2.5 mg total) by mouth daily.  90 tablet  3  . aspirin EC 81 MG EC tablet Take 1 tablet (81 mg total) by mouth daily.      Marland Kitchen atorvastatin (LIPITOR) 80 MG tablet Take 1 tablet (80 mg total) by mouth daily at 6 PM.  30 tablet  5  . diazepam (VALIUM) 5 MG tablet Take 1 tablet (5 mg total) by mouth every 6 (six) hours as needed for anxiety.  60 tablet  1  . glucose blood (ONETOUCH VERIO) test strip Use TID  100 each  12  . ibuprofen (ADVIL,MOTRIN) 200 MG tablet Take 200 mg by mouth every 6 (six) hours as needed. For pain      . insulin aspart (NOVOLOG) 100 UNIT/ML injection Inject 10 Units into the skin 3 (three) times daily with meals.  1 vial  11  . insulin glargine (LANTUS SOLOSTAR) 100 UNIT/ML injection Inject 31 Units into the skin at bedtime.  9 mL  12  . isosorbide mononitrate (IMDUR) 60 MG 24 hr tablet Take 1 tablet (60 mg total) by mouth daily.  30 tablet  5  . lidocaine-prilocaine (EMLA)  cream Apply topically as needed. Apply to port 1-2 hours before procedure  30 g  1  . lisinopril (PRINIVIL,ZESTRIL) 40 MG tablet Take 40 mg by mouth daily.      . metFORMIN (GLUCOPHAGE-XR) 750 MG 24 hr tablet Take 1 tablet (750 mg total) by mouth daily with breakfast.  30 tablet  5  . metoprolol (LOPRESSOR) 50 MG tablet Take 125 mg by mouth 2 (two) times daily.      . nitroGLYCERIN (NITROSTAT) 0.4 MG SL tablet Place 0.4 mg under the tongue every 5 (five) minutes as needed for chest pain.      Marland Kitchen ondansetron (ZOFRAN) 8 MG tablet Take 1 tablet by mouth 2 times daily starting the day after chemo for 2 days, then take 2  times a day as needed for N/V  30 tablet  1  . oxyCODONE-acetaminophen (ROXICET) 5-325 MG per tablet Take 1 tablet by mouth every 4 (four) hours as needed for pain.  20 tablet  0  . Ticagrelor (BRILINTA) 90 MG TABS tablet Take 1 tablet (90 mg total) by mouth 2 (two) times daily.  60 tablet  11   No current facility-administered medications for this visit.    SURGICAL HISTORY:  Past Surgical History  Procedure Laterality Date  . Coronary angioplasty with stent placement    . Breast lumpectomy with needle localization and axillary sentinel lymph node bx Left 01/08/2013    Procedure: LEFT BREAST WIRE GUIDED  LUMPECTOMY AND LEFT AXILLARY SENTINEL  NODE BX;  Surgeon: Emelia Loron, MD;  Location: [facility] OR;  Service: General;  Laterality: Left;  . Back surgery    . Portacath placement Right 02/17/2013    Procedure: INSERTION PORT-A-CATH;  Surgeon: Emelia Loron, MD;  Location: [facility] OR;  Service: General;  Laterality: Right;    REVIEW OF SYSTEMS:  General: fatigue (+), night sweats (-), fever (-), pain (-) Lymph: palpable nodes (-) HEENT: vision changes (-), mucositis (-), gum bleeding (-), epistaxis (-) Cardiovascular: chest pain (-), palpitations (-) Pulmonary: shortness of breath (-), dyspnea on exertion (-), cough (-), hemoptysis (-) GI:  Early satiety (-), melena (-), dysphagia (-), nausea/vomiting (-), diarrhea (-) GU: dysuria (-), hematuria (-), incontinence (-) Musculoskeletal: joint swelling (-), joint pain (-), back pain (-) Neuro: weakness (+), numbness (-), headache (-), confusion (-) Skin: Rash (-), lesions (-), dryness (-) Psych: depression (-), suicidal/homicidal ideation (-), feeling of hopelessness (-)   PHYSICAL EXAMINATION: Blood pressure 155/80, pulse 66, temperature 98.5 F (36.9 C), temperature source Oral, resp. rate 20, height 5\' 5"  (1.651 m), weight 147 lb 6 oz (66.849 kg). Body mass index is 24.52 kg/(m^2). General: Patient is a well appearing female in no acute  distress HEENT: PERRLA, sclerae anicteric no conjunctival pallor, MMM Neck: supple, no palpable adenopathy Lungs: clear to auscultation bilaterally, no wheezes, rhonchi, or rales Cardiovascular: regular rate rhythm, S1, S2, no murmurs, rubs or gallops Abdomen: Soft, non-tender, non-distended, normoactive bowel sounds, no HSM Extremities: warm and well perfused, no clubbing, cyanosis, mild right arm swelling Skin: No rashes or lesions Neuro: Non-focal Breasts: left lumpectomy site healing well, does have mild swelling to the left breast--which has improved, also, right breast without masses or nodularity.  Right port ecchymosis has resolved.   ECOG PERFORMANCE STATUS: 0 - Asymptomatic  LABORATORY DATA: Lab Results  Component Value Date   WBC 16.8* 04/02/2013   HGB 14.1 04/02/2013   HCT 41.6 04/02/2013   MCV 85.1 04/02/2013   PLT 197 04/02/2013  Chemistry      Component Value Date/Time   NA 143 04/02/2013 1349   NA 144 02/10/2013 1245   K 4.0 04/02/2013 1349   K 4.0 02/10/2013 1245   CL 109* 04/02/2013 1349   CL 108 02/10/2013 1245   CO2 26 04/02/2013 1349   CO2 27 02/10/2013 1245   BUN 11.3 04/02/2013 1349   BUN 10 02/10/2013 1245   CREATININE 0.7 04/02/2013 1349   CREATININE 0.51 02/10/2013 1245      Component Value Date/Time   CALCIUM 9.6 04/02/2013 1349   CALCIUM 9.7 02/10/2013 1245   ALKPHOS 102 04/02/2013 1349   ALKPHOS 67 01/06/2013 2205   AST 19 04/02/2013 1349   AST 16 01/06/2013 2205   ALT 26 04/02/2013 1349   ALT 17 01/06/2013 2205   BILITOT 0.91 04/02/2013 1349   BILITOT 0.2* 01/06/2013 2205       RADIOGRAPHIC STUDIES:  Dg Chest Port 1 View  02/17/2013  *RADIOLOGY REPORT*  Clinical Data: Port placement  PORTABLE CHEST - 1 VIEW  Comparison: 09/11/2012  Findings: Port has been placed on the right, introduced from the subclavian vein.  The tip is in the SVC 4 cm above the right atrium.  No pneumothorax.  Lungs are clear.  IMPRESSION: Port well positioned with its tip in the SVC 4 cm above the  right atrium.  No complication evident.   Original Report Authenticated By: Paulina Fusi, M.D.    Dg Fluoro Guide Cv Line-no Report  02/17/2013  CLINICAL DATA: portacath placement   FLOURO GUIDE CV LINE  Fluoroscopy was utilized by the requesting physician.  No radiographic  interpretation.      ASSESSMENT: 62 year old female with   #1 new diagnosis of invasive ductal carcinoma that is triple negative measuring 1.4 cm node-negative. Patient is status post lumpectomy. Postoperatively she is doing well. Diagnosis code 174.2.  #2 patient and I discussed adjuvant therapy. She would be a candidate for adjuvant chemotherapy consisting of CMF given every 3 weeks for a total of 6 cycles. Risks and benefits of treatment were discussed with her. The dates of her chemotherapy include 4/24, 5/15, 6/5, 6/26, 7/17, 8/7.  She will also have appointments 7 days after her chemotherapy for labs and an MD/NP appointment for evaluation of chemotoxicities.    PLAN:   1. Ms. Bridwell is doing well today.  Her labs are stable.  She will proceed with chemotherapy tomorrow.   2. I gave her #10 Percocet for any possible pain related to the previous Neulasta, and told her that in the future should she have any more pain it would need to be evaluated, and we would give her medication for cancer related pain, or refer her to the pain clinic.    3.  I will see her backnext week for labs and evaluation of chemotoxicities.  4. I recommended she f/u with her cardiologist regarding her blood pressure.   All questions were answered. The patient knows to call the clinic with any problems, questions or concerns. We can certainly see the patient much sooner if necessary.  I spent 25 minutes counseling the patient face to face. The total time spent in the appointment was 30 minutes.  Cherie Ouch Lyn Hollingshead, NP Medical Oncology [facility] Phone: [phone] 04/02/2013, 3:23 PM

## 2013-04-03 ENCOUNTER — Ambulatory Visit (HOSPITAL_BASED_OUTPATIENT_CLINIC_OR_DEPARTMENT_OTHER): Payer: BC Managed Care – PPO

## 2013-04-03 VITALS — BP 130/64 | HR 61 | Temp 98.0°F | Resp 20

## 2013-04-03 DIAGNOSIS — C50212 Malignant neoplasm of upper-inner quadrant of left female breast: Secondary | ICD-10-CM

## 2013-04-03 DIAGNOSIS — Z5111 Encounter for antineoplastic chemotherapy: Secondary | ICD-10-CM

## 2013-04-03 DIAGNOSIS — C50919 Malignant neoplasm of unspecified site of unspecified female breast: Secondary | ICD-10-CM

## 2013-04-03 MED ORDER — LORAZEPAM 2 MG/ML IJ SOLN
0.5000 mg | Freq: Once | INTRAMUSCULAR | Status: AC
Start: 2013-04-03 — End: 2013-04-03
  Administered 2013-04-03: 0.5 mg via INTRAVENOUS

## 2013-04-03 MED ORDER — SODIUM CHLORIDE 0.9 % IJ SOLN
10.0000 mL | INTRAMUSCULAR | Status: DC | PRN
  Administered 2013-04-03: 10 mL
  Filled 2013-04-03: qty 10

## 2013-04-03 MED ORDER — SODIUM CHLORIDE 0.9 % IV SOLN
Freq: Once | INTRAVENOUS | Status: AC
  Administered 2013-04-03: 11:00:00 via INTRAVENOUS

## 2013-04-03 MED ORDER — DEXAMETHASONE SODIUM PHOSPHATE 10 MG/ML IJ SOLN
10.0000 mg | Freq: Once | INTRAMUSCULAR | Status: AC
  Administered 2013-04-03: 10 mg via INTRAVENOUS

## 2013-04-03 MED ORDER — ONDANSETRON 8 MG/50ML IVPB (CHCC)
8.0000 mg | Freq: Once | INTRAVENOUS | Status: AC
  Administered 2013-04-03: 8 mg via INTRAVENOUS

## 2013-04-03 MED ORDER — SODIUM CHLORIDE 0.9 % IV SOLN
600.0000 mg/m2 | Freq: Once | INTRAVENOUS | Status: AC
  Administered 2013-04-03: 1040 mg via INTRAVENOUS
  Filled 2013-04-03: qty 52

## 2013-04-03 MED ORDER — FLUOROURACIL CHEMO INJECTION 2.5 GM/50ML
600.0000 mg/m2 | Freq: Once | INTRAVENOUS | Status: AC
  Administered 2013-04-03: 1050 mg via INTRAVENOUS
  Filled 2013-04-03: qty 21

## 2013-04-03 MED ORDER — METHOTREXATE SODIUM CHEMO INJECTION 25 MG/ML
40.0000 mg/m2 | Freq: Once | INTRAMUSCULAR | Status: AC
  Administered 2013-04-03: 70 mg via INTRAVENOUS
  Filled 2013-04-03: qty 2.8

## 2013-04-03 MED ORDER — HEPARIN SOD (PORK) LOCK FLUSH 100 UNIT/ML IV SOLN
500.0000 [IU] | Freq: Once | INTRAVENOUS | Status: AC | PRN
  Administered 2013-04-03: 500 [IU]
  Filled 2013-04-03: qty 5

## 2013-04-10 ENCOUNTER — Ambulatory Visit (HOSPITAL_BASED_OUTPATIENT_CLINIC_OR_DEPARTMENT_OTHER): Payer: BC Managed Care – PPO | Admitting: Adult Health

## 2013-04-10 ENCOUNTER — Encounter: Payer: Self-pay | Admitting: Adult Health

## 2013-04-10 ENCOUNTER — Other Ambulatory Visit (HOSPITAL_BASED_OUTPATIENT_CLINIC_OR_DEPARTMENT_OTHER): Payer: BC Managed Care – PPO | Admitting: Lab

## 2013-04-10 VITALS — BP 156/70 | HR 64 | Temp 98.6°F | Resp 20 | Ht 65.0 in | Wt 145.8 lb

## 2013-04-10 DIAGNOSIS — C50219 Malignant neoplasm of upper-inner quadrant of unspecified female breast: Secondary | ICD-10-CM

## 2013-04-10 DIAGNOSIS — C50212 Malignant neoplasm of upper-inner quadrant of left female breast: Secondary | ICD-10-CM

## 2013-04-10 LAB — CBC WITH DIFFERENTIAL/PLATELET
BASO%: 1.3 % (ref 0.0–2.0)
Basophils Absolute: 0 10*3/uL (ref 0.0–0.1)
EOS%: 1.5 % (ref 0.0–7.0)
Eosinophils Absolute: 0 10*3/uL (ref 0.0–0.5)
HCT: 38.7 % (ref 34.8–46.6)
HGB: 13.1 g/dL (ref 11.6–15.9)
LYMPH%: 19.4 % (ref 14.0–49.7)
MCH: 29 pg (ref 25.1–34.0)
MCHC: 34 g/dL (ref 31.5–36.0)
MCV: 85.4 fL (ref 79.5–101.0)
MONO#: 0.1 10*3/uL (ref 0.1–0.9)
MONO%: 3.6 % (ref 0.0–14.0)
NEUT#: 2.2 10*3/uL (ref 1.5–6.5)
NEUT%: 74.2 % (ref 38.4–76.8)
Platelets: 193 10*3/uL (ref 145–400)
RBC: 4.53 10*6/uL (ref 3.70–5.45)
RDW: 16 % — ABNORMAL HIGH (ref 11.2–14.5)
WBC: 3 10*3/uL — ABNORMAL LOW (ref 3.9–10.3)
lymph#: 0.6 10*3/uL — ABNORMAL LOW (ref 0.9–3.3)

## 2013-04-10 LAB — COMPREHENSIVE METABOLIC PANEL (CC13)
ALT: 15 U/L (ref 0–55)
AST: 12 U/L (ref 5–34)
Albumin: 3.6 g/dL (ref 3.5–5.0)
Alkaline Phosphatase: 81 U/L (ref 40–150)
BUN: 11.1 mg/dL (ref 7.0–26.0)
CO2: 25 mEq/L (ref 22–29)
Calcium: 9.7 mg/dL (ref 8.4–10.4)
Chloride: 110 mEq/L — ABNORMAL HIGH (ref 98–107)
Creatinine: 0.6 mg/dL (ref 0.6–1.1)
Glucose: 109 mg/dl — ABNORMAL HIGH (ref 70–99)
Potassium: 3.5 mEq/L (ref 3.5–5.1)
Sodium: 143 mEq/L (ref 136–145)
Total Bilirubin: 0.66 mg/dL (ref 0.20–1.20)
Total Protein: 6.3 g/dL — ABNORMAL LOW (ref 6.4–8.3)

## 2013-04-10 NOTE — Progress Notes (Signed)
OFFICE PROGRESS NOTE  CC**  [name], MD [address] [facility] [address] 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   PRIOR THERAPY:  #1in January 2014 patient was found to have a suspicious area on screening mammography in the upper outer quadrant of the left breast. She ultimately underwent biopsy of this area which revealed invasive ductal carcinoma. the tumor was estrogen and progesterone receptor negative as well as HER-2/neu negative MRI revealed a solitary mass measuring 1.6 x 1.5 x 1.5 cm in size. Patient was seen in the multidisciplinary breast clinic.   #2patient is status post left lumpectomy with sentinel lymph node biopsies. Her final pathology reveals a 1.4 cm invasive ductal carcinoma grade 3, ER negative PR negative HER-2/neu negative with Ki-67 54%. Sentinel lymph nodes were negative for metastatic disease.   #3 patient is recommended adjuvant chemotherapy consisting of CMFto be given every 3 weeks for a total of 6 cycles adjuvantly.I have recommended CMF since patient does have significant cardiac disease and recently had a myocardial infarction. She also has other neurologic disease as well.  She started CMF of 02/20/13.    CURRENT THERAPY: CMF cycle 3 day 8  INTERVAL HISTORY: Marisa Forbes 62 y.o. female returns for evaluation of her left breast triple negative invasive ductal carcinoma.  She is here for eval one week following receiving cycle 3 of her adjuvant chemotherapy.  She is doing well today.  She denies fevers, chills, nausea, vomiting, constipation, diarrhea, numbness, tingling, pain or any further concern.  Last week she reported that she had previously been seen in the hospital while camping for hypertensive crisis.  She has yet to f/u or call her cardiologist.  When asked why she states that she has f/u in August and has  been monitoring her BP closely and it has been lower than it is today. They are asking for clearance for motorcycle riding. Otherwise, a 10 point ROS is negative.    MEDICAL HISTORY: Past Medical History  Diagnosis Date  . HTN (hypertension)   . Diabetes mellitus without complication   . Stroke, embolic   . Myocardial infarction 09/07/12  . Breast cancer     ALLERGIES:  has No Known Allergies.  MEDICATIONS:  Current Outpatient Prescriptions  Medication Sig Dispense Refill  . amLODipine (NORVASC) 2.5 MG tablet Take 1 tablet (2.5 mg total) by mouth daily.  90 tablet  3  . aspirin EC 81 MG EC tablet Take 1 tablet (81 mg total) by mouth daily.      Marland Kitchen atorvastatin (LIPITOR) 80 MG tablet Take 1 tablet (80 mg total) by mouth daily at 6 PM.  30 tablet  5  . diazepam (VALIUM) 5 MG tablet Take 1 tablet (5 mg total) by mouth every 6 (six) hours as needed for anxiety.  60 tablet  1  . glucose blood (ONETOUCH VERIO) test strip Use TID  100 each  12  . ibuprofen (ADVIL,MOTRIN) 200 MG tablet Take 200 mg by mouth every 6 (six) hours as needed. For pain      . insulin aspart (NOVOLOG) 100 UNIT/ML injection Inject 10 Units into the skin 3 (three) times daily with meals.  1 vial  11  . insulin glargine (LANTUS SOLOSTAR) 100 UNIT/ML injection Inject 31 Units into the skin at bedtime.  9 mL  12  . isosorbide mononitrate (  IMDUR) 60 MG 24 hr tablet TAKE 1 TABLET BY MOUTH DAILY  30 tablet  6  . lidocaine-prilocaine (EMLA) cream Apply topically as needed. Apply to port 1-2 hours before procedure  30 g  1  . lisinopril (PRINIVIL,ZESTRIL) 40 MG tablet Take 40 mg by mouth daily.      . metFORMIN (GLUCOPHAGE-XR) 750 MG 24 hr tablet Take 1 tablet (750 mg total) by mouth daily with breakfast.  30 tablet  5  . metoprolol (LOPRESSOR) 50 MG tablet Take 125 mg by mouth 2 (two) times daily.      . nitroGLYCERIN (NITROSTAT) 0.4 MG SL tablet Place 0.4 mg under the tongue every 5 (five) minutes as needed for chest pain.       Marland Kitchen ondansetron (ZOFRAN) 8 MG tablet Take 1 tablet by mouth 2 times daily starting the day after chemo for 2 days, then take 2 times a day as needed for N/V  30 tablet  1  . oxyCODONE-acetaminophen (ROXICET) 5-325 MG per tablet Take 1 tablet by mouth every 4 (four) hours as needed for pain.  10 tablet  0  . Ticagrelor (BRILINTA) 90 MG TABS tablet Take 1 tablet (90 mg total) by mouth 2 (two) times daily.  60 tablet  11   No current facility-administered medications for this visit.    SURGICAL HISTORY:  Past Surgical History  Procedure Laterality Date  . Coronary angioplasty with stent placement    . Breast lumpectomy with needle localization and axillary sentinel lymph node bx Left 01/08/2013    Procedure: LEFT BREAST WIRE GUIDED  LUMPECTOMY AND LEFT AXILLARY SENTINEL  NODE BX;  Surgeon: Emelia Loron, MD;  Location: [facility] OR;  Service: General;  Laterality: Left;  . Back surgery    . Portacath placement Right 02/17/2013    Procedure: INSERTION PORT-A-CATH;  Surgeon: Emelia Loron, MD;  Location: [facility] OR;  Service: General;  Laterality: Right;    REVIEW OF SYSTEMS:  General: fatigue (+), night sweats (-), fever (-), pain (-) Lymph: palpable nodes (-) HEENT: vision changes (-), mucositis (-), gum bleeding (-), epistaxis (-) Cardiovascular: chest pain (-), palpitations (-) Pulmonary: shortness of breath (-), dyspnea on exertion (-), cough (-), hemoptysis (-) GI:  Early satiety (-), melena (-), dysphagia (-), nausea/vomiting (-), diarrhea (-) GU: dysuria (-), hematuria (-), incontinence (-) Musculoskeletal: joint swelling (-), joint pain (-), back pain (-) Neuro: weakness (-), numbness (-), headache (-), confusion (-) Skin: Rash (-), lesions (-), dryness (-) Psych: depression (-), suicidal/homicidal ideation (-), feeling of hopelessness (-)   PHYSICAL EXAMINATION: Blood pressure 156/70, pulse 64, temperature 98.6 F (37 C), temperature source Oral, resp. rate 20, height 5\' 5"  (1.651 m),  weight 145 lb 12.8 oz (66.134 kg). Body mass index is 24.26 kg/(m^2). General: Patient is a well appearing female in no acute distress HEENT: PERRLA, sclerae anicteric no conjunctival pallor, MMM Neck: supple, no palpable adenopathy Lungs: clear to auscultation bilaterally, no wheezes, rhonchi, or rales Cardiovascular: regular rate rhythm, S1, S2, no murmurs, rubs or gallops Abdomen: Soft, non-tender, non-distended, normoactive bowel sounds, no HSM Extremities: warm and well perfused, no clubbing, cyanosis, mild right arm swelling Skin: No rashes or lesions Neuro: Non-focal Breasts: left lumpectomy site healing well, does have mild swelling to the left breast--which has improved, also, right breast without masses or nodularity.  Right port ecchymosis has resolved.   ECOG PERFORMANCE STATUS: 0 - Asymptomatic  LABORATORY DATA: Lab Results  Component Value Date   WBC 3.0* 04/10/2013  HGB 13.1 04/10/2013   HCT 38.7 04/10/2013   MCV 85.4 04/10/2013   PLT 193 04/10/2013      Chemistry      Component Value Date/Time   NA 143 04/02/2013 1349   NA 144 02/10/2013 1245   K 4.0 04/02/2013 1349   K 4.0 02/10/2013 1245   CL 109* 04/02/2013 1349   CL 108 02/10/2013 1245   CO2 26 04/02/2013 1349   CO2 27 02/10/2013 1245   BUN 11.3 04/02/2013 1349   BUN 10 02/10/2013 1245   CREATININE 0.7 04/02/2013 1349   CREATININE 0.51 02/10/2013 1245      Component Value Date/Time   CALCIUM 9.6 04/02/2013 1349   CALCIUM 9.7 02/10/2013 1245   ALKPHOS 102 04/02/2013 1349   ALKPHOS 67 01/06/2013 2205   AST 19 04/02/2013 1349   AST 16 01/06/2013 2205   ALT 26 04/02/2013 1349   ALT 17 01/06/2013 2205   BILITOT 0.91 04/02/2013 1349   BILITOT 0.2* 01/06/2013 2205       RADIOGRAPHIC STUDIES:  Dg Chest Port 1 View  02/17/2013  *RADIOLOGY REPORT*  Clinical Data: Port placement  PORTABLE CHEST - 1 VIEW  Comparison: 09/11/2012  Findings: Port has been placed on the right, introduced from the subclavian vein.  The tip is in the SVC 4 cm  above the right atrium.  No pneumothorax.  Lungs are clear.  IMPRESSION: Port well positioned with its tip in the SVC 4 cm above the right atrium.  No complication evident.   Original Report Authenticated By: Paulina Fusi, M.D.    Dg Fluoro Guide Cv Line-no Report  02/17/2013  CLINICAL DATA: portacath placement   FLOURO GUIDE CV LINE  Fluoroscopy was utilized by the requesting physician.  No radiographic  interpretation.      ASSESSMENT: 62 year old female with   #1 new diagnosis of invasive ductal carcinoma that is triple negative measuring 1.4 cm node-negative. Patient is status post lumpectomy. Postoperatively she is doing well. Diagnosis code 174.2.  #2 patient and I discussed adjuvant therapy. She would be a candidate for adjuvant chemotherapy consisting of CMF given every 3 weeks for a total of 6 cycles. Risks and benefits of treatment were discussed with her. The dates of her chemotherapy include 4/24, 5/15, 6/5, 6/26, 7/17, 8/7.  She will also have appointments 7 days after her chemotherapy for labs and an MD/NP appointment for evaluation of chemotoxicities.    PLAN:   1. Ms. Tortora is doing well today.  Her labs are stable.  She is not at increased risk for infection.  2. We talked about riding a motorcycle.  Her labs are stable today.  I reminded her that she will be riding at her own risk, however her labs right now do not put her at increased risk of bleeding if she were to get critically injured in an accident.  I recommended avoiding riding the week after chemo.    3.  I will see her back on 6/26 for cycle 4 of CMF.    4. I again recommended she f/u with her cardiologist regarding her blood pressure.   All questions were answered. The patient knows to call the clinic with any problems, questions or concerns. We can certainly see the patient much sooner if necessary.  I spent 25 minutes counseling the patient face to face. The total time spent in the appointment was 30  minutes.  Cherie Ouch Lyn Hollingshead, NP Medical Oncology [facility] Phone: [phone]  04/10/2013, 10:42 AM

## 2013-04-10 NOTE — Patient Instructions (Addendum)
Doing well.  Labs are stable.  Please call us if you have any questions or concerns.    We will see you back on June 26 for cycle four of chemotherapy.

## 2013-04-24 ENCOUNTER — Telehealth: Payer: Self-pay | Admitting: *Deleted

## 2013-04-24 ENCOUNTER — Encounter: Payer: Self-pay | Admitting: Adult Health

## 2013-04-24 ENCOUNTER — Ambulatory Visit (HOSPITAL_BASED_OUTPATIENT_CLINIC_OR_DEPARTMENT_OTHER): Payer: BC Managed Care – PPO

## 2013-04-24 ENCOUNTER — Other Ambulatory Visit (HOSPITAL_BASED_OUTPATIENT_CLINIC_OR_DEPARTMENT_OTHER): Payer: BC Managed Care – PPO | Admitting: Lab

## 2013-04-24 ENCOUNTER — Ambulatory Visit (HOSPITAL_BASED_OUTPATIENT_CLINIC_OR_DEPARTMENT_OTHER): Payer: BC Managed Care – PPO | Admitting: Adult Health

## 2013-04-24 VITALS — BP 166/78 | HR 62 | Temp 97.8°F | Resp 20 | Ht 65.0 in | Wt 147.8 lb

## 2013-04-24 DIAGNOSIS — C50919 Malignant neoplasm of unspecified site of unspecified female breast: Secondary | ICD-10-CM

## 2013-04-24 DIAGNOSIS — Z5111 Encounter for antineoplastic chemotherapy: Secondary | ICD-10-CM

## 2013-04-24 DIAGNOSIS — C50212 Malignant neoplasm of upper-inner quadrant of left female breast: Secondary | ICD-10-CM

## 2013-04-24 DIAGNOSIS — Z171 Estrogen receptor negative status [ER-]: Secondary | ICD-10-CM

## 2013-04-24 DIAGNOSIS — I10 Essential (primary) hypertension: Secondary | ICD-10-CM

## 2013-04-24 DIAGNOSIS — D709 Neutropenia, unspecified: Secondary | ICD-10-CM

## 2013-04-24 DIAGNOSIS — C50219 Malignant neoplasm of upper-inner quadrant of unspecified female breast: Secondary | ICD-10-CM

## 2013-04-24 LAB — CBC WITH DIFFERENTIAL/PLATELET
BASO%: 0.3 % (ref 0.0–2.0)
Basophils Absolute: 0 10*3/uL (ref 0.0–0.1)
EOS%: 4.6 % (ref 0.0–7.0)
Eosinophils Absolute: 0.1 10*3/uL (ref 0.0–0.5)
HCT: 43.4 % (ref 34.8–46.6)
HGB: 14.5 g/dL (ref 11.6–15.9)
LYMPH%: 42.8 % (ref 14.0–49.7)
MCH: 29 pg (ref 25.1–34.0)
MCHC: 33.4 g/dL (ref 31.5–36.0)
MCV: 86.8 fL (ref 79.5–101.0)
MONO#: 0.5 10*3/uL (ref 0.1–0.9)
MONO%: 16.1 % — ABNORMAL HIGH (ref 0.0–14.0)
NEUT#: 1.1 10*3/uL — ABNORMAL LOW (ref 1.5–6.5)
NEUT%: 36.2 % — ABNORMAL LOW (ref 38.4–76.8)
Platelets: 186 10*3/uL (ref 145–400)
RBC: 5 10*6/uL (ref 3.70–5.45)
RDW: 15.6 % — ABNORMAL HIGH (ref 11.2–14.5)
WBC: 3 10*3/uL — ABNORMAL LOW (ref 3.9–10.3)
lymph#: 1.3 10*3/uL (ref 0.9–3.3)

## 2013-04-24 LAB — COMPREHENSIVE METABOLIC PANEL (CC13)
ALT: 21 U/L (ref 0–55)
AST: 15 U/L (ref 5–34)
Albumin: 3.9 g/dL (ref 3.5–5.0)
Alkaline Phosphatase: 97 U/L (ref 40–150)
BUN: 10 mg/dL (ref 7.0–26.0)
CO2: 25 mEq/L (ref 22–29)
Calcium: 10 mg/dL (ref 8.4–10.4)
Chloride: 109 mEq/L (ref 98–109)
Creatinine: 0.7 mg/dL (ref 0.6–1.1)
Glucose: 55 mg/dl — ABNORMAL LOW (ref 70–140)
Potassium: 4 mEq/L (ref 3.5–5.1)
Sodium: 142 mEq/L (ref 136–145)
Total Bilirubin: 0.92 mg/dL (ref 0.20–1.20)
Total Protein: 6.9 g/dL (ref 6.4–8.3)

## 2013-04-24 MED ORDER — ONDANSETRON 8 MG/50ML IVPB (CHCC)
8.0000 mg | Freq: Once | INTRAVENOUS | Status: AC
  Administered 2013-04-24: 8 mg via INTRAVENOUS

## 2013-04-24 MED ORDER — HEPARIN SOD (PORK) LOCK FLUSH 100 UNIT/ML IV SOLN
500.0000 [IU] | Freq: Once | INTRAVENOUS | Status: AC | PRN
  Administered 2013-04-24: 500 [IU]
  Filled 2013-04-24: qty 5

## 2013-04-24 MED ORDER — LORAZEPAM 2 MG/ML IJ SOLN
0.5000 mg | Freq: Once | INTRAMUSCULAR | Status: AC
  Administered 2013-04-24: 0.5 mg via INTRAVENOUS

## 2013-04-24 MED ORDER — SODIUM CHLORIDE 0.9 % IV SOLN
Freq: Once | INTRAVENOUS | Status: AC
  Administered 2013-04-24: 12:00:00 via INTRAVENOUS

## 2013-04-24 MED ORDER — DEXAMETHASONE SODIUM PHOSPHATE 10 MG/ML IJ SOLN
10.0000 mg | Freq: Once | INTRAMUSCULAR | Status: AC
  Administered 2013-04-24: 10 mg via INTRAVENOUS

## 2013-04-24 MED ORDER — METHOTREXATE SODIUM CHEMO INJECTION 25 MG/ML
40.0000 mg/m2 | Freq: Once | INTRAMUSCULAR | Status: AC
  Administered 2013-04-24: 70 mg via INTRAVENOUS
  Filled 2013-04-24: qty 2.8

## 2013-04-24 MED ORDER — FLUOROURACIL CHEMO INJECTION 2.5 GM/50ML
600.0000 mg/m2 | Freq: Once | INTRAVENOUS | Status: AC
  Administered 2013-04-24: 1050 mg via INTRAVENOUS
  Filled 2013-04-24: qty 21

## 2013-04-24 MED ORDER — SODIUM CHLORIDE 0.9 % IJ SOLN
10.0000 mL | INTRAMUSCULAR | Status: DC | PRN
  Administered 2013-04-24: 10 mL
  Filled 2013-04-24: qty 10

## 2013-04-24 MED ORDER — SODIUM CHLORIDE 0.9 % IV SOLN
600.0000 mg/m2 | Freq: Once | INTRAVENOUS | Status: AC
  Administered 2013-04-24: 1040 mg via INTRAVENOUS
  Filled 2013-04-24: qty 52

## 2013-04-24 NOTE — Patient Instructions (Addendum)
Doing well.  Proceed with chemotherapy.  You need to receive Neulasta tomorrow.  Please call us if you have any questions or concerns.

## 2013-04-24 NOTE — Telephone Encounter (Signed)
appts made and printed...td 

## 2013-04-24 NOTE — Patient Instructions (Addendum)
[facility] Discharge Instructions for Patients Receiving Chemotherapy  Today you received the following chemotherapy agents Cytoxan/Methotrexate/5 FU To help prevent nausea and vomiting after your treatment, we encourage you to take your nausea medication as prescribed.  If you develop nausea and vomiting that is not controlled by your nausea medication, call the clinic.   BELOW ARE SYMPTOMS THAT SHOULD BE REPORTED IMMEDIATELY:  *FEVER GREATER THAN 100.5 F  *CHILLS WITH OR WITHOUT FEVER  NAUSEA AND VOMITING THAT IS NOT CONTROLLED WITH YOUR NAUSEA MEDICATION  *UNUSUAL SHORTNESS OF BREATH  *UNUSUAL BRUISING OR BLEEDING  TENDERNESS IN MOUTH AND THROAT WITH OR WITHOUT PRESENCE OF ULCERS  *URINARY PROBLEMS  *BOWEL PROBLEMS  UNUSUAL RASH Items with * indicate a potential emergency and should be followed up as soon as possible.  Feel free to call the clinic you have any questions or concerns. The clinic phone number is [phone].    

## 2013-04-24 NOTE — Progress Notes (Signed)
OFFICE PROGRESS NOTE  CC**  [name], MD [address] [facility] [address] 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   PRIOR THERAPY:  #1in January 2014 patient was found to have a suspicious area on screening mammography in the upper outer quadrant of the left breast. She ultimately underwent biopsy of this area which revealed invasive ductal carcinoma. the tumor was estrogen and progesterone receptor negative as well as HER-2/neu negative MRI revealed a solitary mass measuring 1.6 x 1.5 x 1.5 cm in size. Patient was seen in the multidisciplinary breast clinic.   #2patient is status post left lumpectomy with sentinel lymph node biopsies. Her final pathology reveals a 1.4 cm invasive ductal carcinoma grade 3, ER negative PR negative HER-2/neu negative with Ki-67 54%. Sentinel lymph nodes were negative for metastatic disease.   #3 patient is recommended adjuvant chemotherapy consisting of CMFto be given every 3 weeks for a total of 6 cycles adjuvantly.I have recommended CMF since patient does have significant cardiac disease and recently had a myocardial infarction. She also has other neurologic disease as well.  She started CMF of 02/20/13.    CURRENT THERAPY: CMF cycle 4 day 1  INTERVAL HISTORY: Marisa Forbes 62 y.o. female returns for evaluation of her left breast triple negative invasive ductal carcinoma. She is fatigued, but otherwise feeling well.  She denies fevers, chills, nausea, vomiting, constipation, diarrhea, numbness or any further concerns.  Her blood sugar was 55 this morning, but she did fast for her labs today.  She has since eaten a pack of crackers.  Otherwise, a 10 point ROS is negative.     MEDICAL HISTORY: Past Medical History  Diagnosis Date  . HTN (hypertension)   . Diabetes mellitus without complication   . Stroke, embolic    . Myocardial infarction 09/07/12  . Breast cancer     ALLERGIES:  has No Known Allergies.  MEDICATIONS:  Current Outpatient Prescriptions  Medication Sig Dispense Refill  . amLODipine (NORVASC) 2.5 MG tablet Take 1 tablet (2.5 mg total) by mouth daily.  90 tablet  3  . aspirin EC 81 MG EC tablet Take 1 tablet (81 mg total) by mouth daily.      Marland Kitchen atorvastatin (LIPITOR) 80 MG tablet Take 1 tablet (80 mg total) by mouth daily at 6 PM.  30 tablet  5  . diazepam (VALIUM) 5 MG tablet Take 1 tablet (5 mg total) by mouth every 6 (six) hours as needed for anxiety.  60 tablet  1  . glucose blood (ONETOUCH VERIO) test strip Use TID  100 each  12  . ibuprofen (ADVIL,MOTRIN) 200 MG tablet Take 200 mg by mouth every 6 (six) hours as needed. For pain      . insulin aspart (NOVOLOG) 100 UNIT/ML injection Inject 10 Units into the skin 3 (three) times daily with meals.  1 vial  11  . insulin glargine (LANTUS SOLOSTAR) 100 UNIT/ML injection Inject 31 Units into the skin at bedtime.  9 mL  12  . isosorbide mononitrate (IMDUR) 60 MG 24 hr tablet TAKE 1 TABLET BY MOUTH DAILY  30 tablet  6  . lidocaine-prilocaine (EMLA) cream Apply topically as needed. Apply to port 1-2 hours before procedure  30 g  1  . lisinopril (PRINIVIL,ZESTRIL) 40 MG tablet Take 40 mg by mouth daily.      Marland Kitchen  metFORMIN (GLUCOPHAGE-XR) 750 MG 24 hr tablet Take 1 tablet (750 mg total) by mouth daily with breakfast.  30 tablet  5  . metoprolol (LOPRESSOR) 50 MG tablet Take 125 mg by mouth 2 (two) times daily.      . nitroGLYCERIN (NITROSTAT) 0.4 MG SL tablet Place 0.4 mg under the tongue every 5 (five) minutes as needed for chest pain.      Marland Kitchen ondansetron (ZOFRAN) 8 MG tablet Take 1 tablet by mouth 2 times daily starting the day after chemo for 2 days, then take 2 times a day as needed for N/V  30 tablet  1  . oxyCODONE-acetaminophen (ROXICET) 5-325 MG per tablet Take 1 tablet by mouth every 4 (four) hours as needed for pain.  10 tablet  0  .  Ticagrelor (BRILINTA) 90 MG TABS tablet Take 1 tablet (90 mg total) by mouth 2 (two) times daily.  60 tablet  11   No current facility-administered medications for this visit.    SURGICAL HISTORY:  Past Surgical History  Procedure Laterality Date  . Coronary angioplasty with stent placement    . Breast lumpectomy with needle localization and axillary sentinel lymph node bx Left 01/08/2013    Procedure: LEFT BREAST WIRE GUIDED  LUMPECTOMY AND LEFT AXILLARY SENTINEL  NODE BX;  Surgeon: Emelia Loron, MD;  Location: [facility] OR;  Service: General;  Laterality: Left;  . Back surgery    . Portacath placement Right 02/17/2013    Procedure: INSERTION PORT-A-CATH;  Surgeon: Emelia Loron, MD;  Location: [facility] OR;  Service: General;  Laterality: Right;    REVIEW OF SYSTEMS:  General: fatigue (+), night sweats (-), fever (-), pain (-) Lymph: palpable nodes (-) HEENT: vision changes (-), mucositis (-), gum bleeding (-), epistaxis (-) Cardiovascular: chest pain (-), palpitations (-) Pulmonary: shortness of breath (-), dyspnea on exertion (-), cough (-), hemoptysis (-) GI:  Early satiety (-), melena (-), dysphagia (-), nausea/vomiting (-), diarrhea (-) GU: dysuria (-), hematuria (-), incontinence (-) Musculoskeletal: joint swelling (-), joint pain (-), back pain (-) Neuro: weakness (-), numbness (-), headache (-), confusion (-) Skin: Rash (-), lesions (-), dryness (-) Psych: depression (-), suicidal/homicidal ideation (-), feeling of hopelessness (-)   PHYSICAL EXAMINATION: There were no vitals taken for this visit. There is no weight on file to calculate BMI. General: Patient is a well appearing female in no acute distress HEENT: PERRLA, sclerae anicteric no conjunctival pallor, MMM Neck: supple, no palpable adenopathy Lungs: clear to auscultation bilaterally, no wheezes, rhonchi, or rales Cardiovascular: regular rate rhythm, S1, S2, no murmurs, rubs or gallops Abdomen: Soft, non-tender,  non-distended, normoactive bowel sounds, no HSM Extremities: warm and well perfused, no clubbing, cyanosis, mild right arm swelling Skin: No rashes or lesions Neuro: Non-focal Breasts: left lumpectomy site healing well, does have mild swelling to the left breast--which has improved, also, right breast without masses or nodularity.  Right port ecchymosis has resolved.   ECOG PERFORMANCE STATUS: 0 - Asymptomatic  LABORATORY DATA: Lab Results  Component Value Date   WBC 3.0* 04/24/2013   HGB 14.5 04/24/2013   HCT 43.4 04/24/2013   MCV 86.8 04/24/2013   PLT 186 04/24/2013      Chemistry      Component Value Date/Time   NA 142 04/24/2013 1007   NA 144 02/10/2013 1245   K 4.0 04/24/2013 1007   K 4.0 02/10/2013 1245   CL 110* 04/10/2013 1020   CL 108 02/10/2013 1245   CO2 25 04/24/2013 1007  CO2 27 02/10/2013 1245   BUN 10.0 04/24/2013 1007   BUN 10 02/10/2013 1245   CREATININE 0.7 04/24/2013 1007   CREATININE 0.51 02/10/2013 1245      Component Value Date/Time   CALCIUM 10.0 04/24/2013 1007   CALCIUM 9.7 02/10/2013 1245   ALKPHOS 97 04/24/2013 1007   ALKPHOS 67 01/06/2013 2205   AST 15 04/24/2013 1007   AST 16 01/06/2013 2205   ALT 21 04/24/2013 1007   ALT 17 01/06/2013 2205   BILITOT 0.92 04/24/2013 1007   BILITOT 0.2* 01/06/2013 2205       RADIOGRAPHIC STUDIES:  Dg Chest Port 1 View  02/17/2013  *RADIOLOGY REPORT*  Clinical Data: Port placement  PORTABLE CHEST - 1 VIEW  Comparison: 09/11/2012  Findings: Port has been placed on the right, introduced from the subclavian vein.  The tip is in the SVC 4 cm above the right atrium.  No pneumothorax.  Lungs are clear.  IMPRESSION: Port well positioned with its tip in the SVC 4 cm above the right atrium.  No complication evident.   Original Report Authenticated By: Paulina Fusi, M.D.    Dg Fluoro Guide Cv Line-no Report  02/17/2013  CLINICAL DATA: portacath placement   FLOURO GUIDE CV LINE  Fluoroscopy was utilized by the requesting physician.  No  radiographic  interpretation.      ASSESSMENT: 62 year old female with   #1 new diagnosis of invasive ductal carcinoma that is triple negative measuring 1.4 cm node-negative. Patient is status post lumpectomy. Postoperatively she is doing well. Diagnosis code 174.2.  #2 patient and I discussed adjuvant therapy. She would be a candidate for adjuvant chemotherapy consisting of CMF given every 3 weeks for a total of 6 cycles. Risks and benefits of treatment were discussed with her. The dates of her chemotherapy include 4/24, 5/15, 6/5, 6/26, 7/17, 8/7.  She will also have appointments 7 days after her chemotherapy for labs and an MD/NP appointment for evaluation of chemotoxicities. She also occasionally requires Neulasta injections the day following chemotherapy due to mild neutropenia.     PLAN:   1. Ms. Buehner is doing well today.  She will proceed with chemotherapy.  Her ANC is 1100, she will receive Neulasta tomorrow.    2.  She will return tomorrow for Neulasta, and next week for labs and evaluation for chemotoxicities next week.    3.She is planning on following up with her cardiologist about her hypertension on 06/09/13.    All questions were answered. The patient knows to call the clinic with any problems, questions or concerns. We can certainly see the patient much sooner if necessary.  I spent 25 minutes counseling the patient face to face. The total time spent in the appointment was 30 minutes.  Cherie Ouch Lyn Hollingshead, NP Medical Oncology [facility] Phone: [phone] 04/24/2013, 11:00 AM

## 2013-04-25 ENCOUNTER — Ambulatory Visit (HOSPITAL_BASED_OUTPATIENT_CLINIC_OR_DEPARTMENT_OTHER): Payer: BC Managed Care – PPO

## 2013-04-25 VITALS — BP 145/86 | HR 67 | Temp 97.0°F | Resp 20

## 2013-04-25 DIAGNOSIS — C50212 Malignant neoplasm of upper-inner quadrant of left female breast: Secondary | ICD-10-CM

## 2013-04-25 DIAGNOSIS — Z5189 Encounter for other specified aftercare: Secondary | ICD-10-CM

## 2013-04-25 DIAGNOSIS — C50919 Malignant neoplasm of unspecified site of unspecified female breast: Secondary | ICD-10-CM

## 2013-04-25 MED ORDER — PEGFILGRASTIM INJECTION 6 MG/0.6ML
6.0000 mg | Freq: Once | SUBCUTANEOUS | Status: AC
  Administered 2013-04-25: 6 mg via SUBCUTANEOUS
  Filled 2013-04-25: qty 0.6

## 2013-05-01 ENCOUNTER — Other Ambulatory Visit (HOSPITAL_COMMUNITY): Payer: Self-pay | Admitting: Physician Assistant

## 2013-05-01 ENCOUNTER — Ambulatory Visit (HOSPITAL_BASED_OUTPATIENT_CLINIC_OR_DEPARTMENT_OTHER): Payer: BC Managed Care – PPO | Admitting: Adult Health

## 2013-05-01 ENCOUNTER — Other Ambulatory Visit (HOSPITAL_BASED_OUTPATIENT_CLINIC_OR_DEPARTMENT_OTHER): Payer: BC Managed Care – PPO | Admitting: Lab

## 2013-05-01 ENCOUNTER — Encounter: Payer: Self-pay | Admitting: Adult Health

## 2013-05-01 VITALS — BP 129/70 | HR 63 | Temp 99.1°F | Resp 20 | Ht 65.0 in | Wt 145.5 lb

## 2013-05-01 DIAGNOSIS — C50219 Malignant neoplasm of upper-inner quadrant of unspecified female breast: Secondary | ICD-10-CM

## 2013-05-01 DIAGNOSIS — C50919 Malignant neoplasm of unspecified site of unspecified female breast: Secondary | ICD-10-CM

## 2013-05-01 DIAGNOSIS — C50212 Malignant neoplasm of upper-inner quadrant of left female breast: Secondary | ICD-10-CM

## 2013-05-01 LAB — CBC WITH DIFFERENTIAL/PLATELET
BASO%: 0.7 % (ref 0.0–2.0)
Basophils Absolute: 0 10*3/uL (ref 0.0–0.1)
EOS%: 2.8 % (ref 0.0–7.0)
Eosinophils Absolute: 0.1 10*3/uL (ref 0.0–0.5)
HCT: 39.6 % (ref 34.8–46.6)
HGB: 13.3 g/dL (ref 11.6–15.9)
LYMPH%: 13 % — ABNORMAL LOW (ref 14.0–49.7)
MCH: 29.2 pg (ref 25.1–34.0)
MCHC: 33.6 g/dL (ref 31.5–36.0)
MCV: 86.9 fL (ref 79.5–101.0)
MONO#: 0.2 10*3/uL (ref 0.1–0.9)
MONO%: 3.7 % (ref 0.0–14.0)
NEUT#: 3.5 10*3/uL (ref 1.5–6.5)
NEUT%: 79.8 % — ABNORMAL HIGH (ref 38.4–76.8)
Platelets: 100 10*3/uL — ABNORMAL LOW (ref 145–400)
RBC: 4.56 10*6/uL (ref 3.70–5.45)
RDW: 15.9 % — ABNORMAL HIGH (ref 11.2–14.5)
WBC: 4.4 10*3/uL (ref 3.9–10.3)
lymph#: 0.6 10*3/uL — ABNORMAL LOW (ref 0.9–3.3)

## 2013-05-01 LAB — COMPREHENSIVE METABOLIC PANEL (CC13)
ALT: 21 U/L (ref 0–55)
AST: 14 U/L (ref 5–34)
Albumin: 3.7 g/dL (ref 3.5–5.0)
Alkaline Phosphatase: 126 U/L (ref 40–150)
BUN: 9.4 mg/dL (ref 7.0–26.0)
CO2: 26 mEq/L (ref 22–29)
Calcium: 10 mg/dL (ref 8.4–10.4)
Chloride: 109 mEq/L (ref 98–109)
Creatinine: 0.6 mg/dL (ref 0.6–1.1)
Glucose: 79 mg/dl (ref 70–140)
Potassium: 3.9 mEq/L (ref 3.5–5.1)
Sodium: 140 mEq/L (ref 136–145)
Total Bilirubin: 0.99 mg/dL (ref 0.20–1.20)
Total Protein: 6.3 g/dL — ABNORMAL LOW (ref 6.4–8.3)

## 2013-05-01 NOTE — Telephone Encounter (Signed)
Rx was sent to pharmacy electronically. 
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
## 2013-05-01 NOTE — Patient Instructions (Signed)
Doing well.  Labs are stable.  We will see you back in 2 weeks. Please call us if you have any questions or concerns.

## 2013-05-13 ENCOUNTER — Telehealth (HOSPITAL_COMMUNITY): Payer: Self-pay | Admitting: Cardiovascular Disease

## 2013-05-15 ENCOUNTER — Other Ambulatory Visit (HOSPITAL_BASED_OUTPATIENT_CLINIC_OR_DEPARTMENT_OTHER): Payer: BC Managed Care – PPO | Admitting: Lab

## 2013-05-15 ENCOUNTER — Telehealth: Payer: Self-pay | Admitting: Oncology

## 2013-05-15 ENCOUNTER — Ambulatory Visit (HOSPITAL_BASED_OUTPATIENT_CLINIC_OR_DEPARTMENT_OTHER): Payer: BC Managed Care – PPO

## 2013-05-15 ENCOUNTER — Encounter: Payer: Self-pay | Admitting: Adult Health

## 2013-05-15 ENCOUNTER — Ambulatory Visit (HOSPITAL_BASED_OUTPATIENT_CLINIC_OR_DEPARTMENT_OTHER): Payer: BC Managed Care – PPO | Admitting: Adult Health

## 2013-05-15 VITALS — BP 159/77 | HR 64 | Temp 97.7°F | Resp 20 | Ht 65.0 in | Wt 149.0 lb

## 2013-05-15 DIAGNOSIS — C50919 Malignant neoplasm of unspecified site of unspecified female breast: Secondary | ICD-10-CM

## 2013-05-15 DIAGNOSIS — C50212 Malignant neoplasm of upper-inner quadrant of left female breast: Secondary | ICD-10-CM

## 2013-05-15 DIAGNOSIS — Z5111 Encounter for antineoplastic chemotherapy: Secondary | ICD-10-CM

## 2013-05-15 DIAGNOSIS — C50219 Malignant neoplasm of upper-inner quadrant of unspecified female breast: Secondary | ICD-10-CM

## 2013-05-15 DIAGNOSIS — I10 Essential (primary) hypertension: Secondary | ICD-10-CM

## 2013-05-15 DIAGNOSIS — E119 Type 2 diabetes mellitus without complications: Secondary | ICD-10-CM

## 2013-05-15 DIAGNOSIS — Z171 Estrogen receptor negative status [ER-]: Secondary | ICD-10-CM

## 2013-05-15 LAB — COMPREHENSIVE METABOLIC PANEL (CC13)
ALT: 33 U/L (ref 0–55)
AST: 23 U/L (ref 5–34)
Albumin: 3.7 g/dL (ref 3.5–5.0)
Alkaline Phosphatase: 104 U/L (ref 40–150)
BUN: 10.2 mg/dL (ref 7.0–26.0)
CO2: 24 mEq/L (ref 22–29)
Calcium: 9.5 mg/dL (ref 8.4–10.4)
Chloride: 108 mEq/L (ref 98–109)
Creatinine: 0.6 mg/dL (ref 0.6–1.1)
Glucose: 133 mg/dl (ref 70–140)
Potassium: 4 mEq/L (ref 3.5–5.1)
Sodium: 140 mEq/L (ref 136–145)
Total Bilirubin: 0.71 mg/dL (ref 0.20–1.20)
Total Protein: 6.5 g/dL (ref 6.4–8.3)

## 2013-05-15 LAB — CBC WITH DIFFERENTIAL/PLATELET
BASO%: 0.3 % (ref 0.0–2.0)
Basophils Absolute: 0 10*3/uL (ref 0.0–0.1)
EOS%: 1.2 % (ref 0.0–7.0)
Eosinophils Absolute: 0.1 10*3/uL (ref 0.0–0.5)
HCT: 38.3 % (ref 34.8–46.6)
HGB: 13 g/dL (ref 11.6–15.9)
LYMPH%: 20.7 % (ref 14.0–49.7)
MCH: 29.2 pg (ref 25.1–34.0)
MCHC: 33.9 g/dL (ref 31.5–36.0)
MCV: 86.1 fL (ref 79.5–101.0)
MONO#: 0.6 10*3/uL (ref 0.1–0.9)
MONO%: 7.9 % (ref 0.0–14.0)
NEUT#: 5.3 10*3/uL (ref 1.5–6.5)
NEUT%: 69.9 % (ref 38.4–76.8)
Platelets: 217 10*3/uL (ref 145–400)
RBC: 4.45 10*6/uL (ref 3.70–5.45)
RDW: 15.3 % — ABNORMAL HIGH (ref 11.2–14.5)
WBC: 7.6 10*3/uL (ref 3.9–10.3)
lymph#: 1.6 10*3/uL (ref 0.9–3.3)
nRBC: 0 % (ref 0–0)

## 2013-05-15 MED ORDER — LORAZEPAM 2 MG/ML IJ SOLN
0.5000 mg | Freq: Once | INTRAMUSCULAR | Status: AC
  Administered 2013-05-15: 0.5 mg via INTRAVENOUS

## 2013-05-15 MED ORDER — ONDANSETRON 8 MG/50ML IVPB (CHCC)
8.0000 mg | Freq: Once | INTRAVENOUS | Status: AC
  Administered 2013-05-15: 8 mg via INTRAVENOUS

## 2013-05-15 MED ORDER — SODIUM CHLORIDE 0.9 % IV SOLN
600.0000 mg/m2 | Freq: Once | INTRAVENOUS | Status: AC
  Administered 2013-05-15: 1040 mg via INTRAVENOUS
  Filled 2013-05-15: qty 52

## 2013-05-15 MED ORDER — DEXAMETHASONE SODIUM PHOSPHATE 10 MG/ML IJ SOLN
10.0000 mg | Freq: Once | INTRAMUSCULAR | Status: AC
  Administered 2013-05-15: 10 mg via INTRAVENOUS

## 2013-05-15 MED ORDER — SODIUM CHLORIDE 0.9 % IV SOLN
Freq: Once | INTRAVENOUS | Status: AC
  Administered 2013-05-15: 12:00:00 via INTRAVENOUS

## 2013-05-15 MED ORDER — METHOTREXATE SODIUM CHEMO INJECTION 25 MG/ML
40.0000 mg/m2 | Freq: Once | INTRAMUSCULAR | Status: AC
  Administered 2013-05-15: 70 mg via INTRAVENOUS
  Filled 2013-05-15: qty 2.8

## 2013-05-15 MED ORDER — HEPARIN SOD (PORK) LOCK FLUSH 100 UNIT/ML IV SOLN
500.0000 [IU] | Freq: Once | INTRAVENOUS | Status: AC | PRN
  Administered 2013-05-15: 500 [IU]
  Filled 2013-05-15: qty 5

## 2013-05-15 MED ORDER — SODIUM CHLORIDE 0.9 % IJ SOLN
10.0000 mL | INTRAMUSCULAR | Status: DC | PRN
  Administered 2013-05-15: 10 mL
  Filled 2013-05-15: qty 10

## 2013-05-15 MED ORDER — FLUOROURACIL CHEMO INJECTION 2.5 GM/50ML
600.0000 mg/m2 | Freq: Once | INTRAVENOUS | Status: AC
  Administered 2013-05-15: 1050 mg via INTRAVENOUS
  Filled 2013-05-15: qty 21

## 2013-05-15 NOTE — Patient Instructions (Addendum)
[facility] Discharge Instructions for Patients Receiving Chemotherapy  Today you received the following chemotherapy agents :  Methotrexate, Fluorouracil, Cytoxan.  To help prevent nausea and vomiting after your treatment, we encourage you to take your nausea medication as instructed by your physician.   If you develop nausea and vomiting that is not controlled by your nausea medication, call the clinic.   BELOW ARE SYMPTOMS THAT SHOULD BE REPORTED IMMEDIATELY:  *FEVER GREATER THAN 100.5 F  *CHILLS WITH OR WITHOUT FEVER  NAUSEA AND VOMITING THAT IS NOT CONTROLLED WITH YOUR NAUSEA MEDICATION  *UNUSUAL SHORTNESS OF BREATH  *UNUSUAL BRUISING OR BLEEDING  TENDERNESS IN MOUTH AND THROAT WITH OR WITHOUT PRESENCE OF ULCERS  *URINARY PROBLEMS  *BOWEL PROBLEMS  UNUSUAL RASH Items with * indicate a potential emergency and should be followed up as soon as possible.  Feel free to call the clinic you have any questions or concerns. The clinic phone number is [phone].

## 2013-05-15 NOTE — Progress Notes (Signed)
OFFICE PROGRESS NOTE  CC**  Marisa Linger, MD [address] 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   PRIOR THERAPY:  #1in January 2014 patient was found to have a suspicious area on screening mammography in the upper outer quadrant of the left breast. She ultimately underwent biopsy of this area which revealed invasive ductal carcinoma. the tumor was estrogen and progesterone receptor negative as well as HER-2/neu negative MRI revealed a solitary mass measuring 1.6 x 1.5 x 1.5 cm in size. Patient was seen in the multidisciplinary breast clinic.   #2patient is status post left lumpectomy with sentinel lymph node biopsies. Her final pathology reveals a 1.4 cm invasive ductal carcinoma grade 3, ER negative PR negative HER-2/neu negative with Ki-67 54%. Sentinel lymph nodes were negative for metastatic disease.   #3 patient is recommended adjuvant chemotherapy consisting of CMFto be given every 3 weeks for a total of 6 cycles adjuvantly.I have recommended CMF since patient does have significant cardiac disease and recently had a myocardial infarction. She also has other neurologic disease as well.  She started CMF of 02/20/13.    CURRENT THERAPY: CMF cycle 5 day 1  INTERVAL HISTORY: Marisa Forbes 62 y.o. female returns for evaluation of her left breast triple negative invasive ductal carcinoma.  She is here before her fifth cycle of chemotherapy and is feeling well.  She denies fevers, chills, nausea, vomiting, constipation, diarrhea, numbness, or any other concerns.  Her left breast continues to improve.  Otherwise, a 10 point ROS is negative.    MEDICAL HISTORY: Past Medical History  Diagnosis Date  . HTN (hypertension)   . Diabetes mellitus without complication   . Stroke, embolic   . Myocardial infarction 09/07/12  . Breast  cancer     ALLERGIES:  has No Known Allergies.  MEDICATIONS:  Current Outpatient Prescriptions  Medication Sig Dispense Refill  . amLODipine (NORVASC) 2.5 MG tablet Take 1 tablet (2.5 mg total) by mouth daily.  90 tablet  3  . aspirin EC 81 MG EC tablet Take 1 tablet (81 mg total) by mouth daily.      Marland Kitchen atorvastatin (LIPITOR) 80 MG tablet TAKE 1 TABLET BY MOUTH DAILY AT 6:00 PM  30 tablet  7  . diazepam (VALIUM) 5 MG tablet Take 1 tablet (5 mg total) by mouth every 6 (six) hours as needed for anxiety.  60 tablet  1  . glucose blood (ONETOUCH VERIO) test strip Use TID  100 each  12  . ibuprofen (ADVIL,MOTRIN) 200 MG tablet Take 200 mg by mouth every 6 (six) hours as needed. For pain      . insulin aspart (NOVOLOG) 100 UNIT/ML injection Inject 10 Units into the skin 3 (three) times daily with meals.  1 vial  11  . insulin glargine (LANTUS SOLOSTAR) 100 UNIT/ML injection Inject 31 Units into the skin at bedtime.  9 mL  12  . isosorbide mononitrate (IMDUR) 60 MG 24 hr tablet TAKE 1 TABLET BY MOUTH DAILY  30 tablet  6  . lidocaine-prilocaine (EMLA) cream Apply topically as needed. Apply to port 1-2 hours before procedure  30 g  1  . lisinopril (PRINIVIL,ZESTRIL) 40 MG tablet Take 40 mg by mouth daily.      . metFORMIN (GLUCOPHAGE-XR) 750 MG 24 hr tablet Take 1 tablet (750 mg total)  by mouth daily with breakfast.  30 tablet  5  . metoprolol (LOPRESSOR) 50 MG tablet Take 125 mg by mouth 2 (two) times daily.      . nitroGLYCERIN (NITROSTAT) 0.4 MG SL tablet Place 0.4 mg under the tongue every 5 (five) minutes as needed for chest pain.      Marland Kitchen ondansetron (ZOFRAN) 8 MG tablet Take 1 tablet by mouth 2 times daily starting the day after chemo for 2 days, then take 2 times a day as needed for N/V  30 tablet  1  . oxyCODONE-acetaminophen (ROXICET) 5-325 MG per tablet Take 1 tablet by mouth every 4 (four) hours as needed for pain.  10 tablet  0  . Ticagrelor (BRILINTA) 90 MG TABS tablet Take 1 tablet (90 mg  total) by mouth 2 (two) times daily.  60 tablet  11   No current facility-administered medications for this visit.    SURGICAL HISTORY:  Past Surgical History  Procedure Laterality Date  . Coronary angioplasty with stent placement    . Breast lumpectomy with needle localization and axillary sentinel lymph node bx Left 01/08/2013    Procedure: LEFT BREAST WIRE GUIDED  LUMPECTOMY AND LEFT AXILLARY SENTINEL  NODE BX;  Surgeon: Emelia Loron, MD;  Location: [facility] OR;  Service: General;  Laterality: Left;  . Back surgery    . Portacath placement Right 02/17/2013    Procedure: INSERTION PORT-A-CATH;  Surgeon: Emelia Loron, MD;  Location: [facility] OR;  Service: General;  Laterality: Right;    REVIEW OF SYSTEMS:  General: fatigue (+), night sweats (-), fever (-), pain (-) Lymph: palpable nodes (-) HEENT: vision changes (-), mucositis (-), gum bleeding (-), epistaxis (-) Cardiovascular: chest pain (-), palpitations (-) Pulmonary: shortness of breath (-), dyspnea on exertion (-), cough (-), hemoptysis (-) GI:  Early satiety (-), melena (-), dysphagia (-), nausea/vomiting (-), diarrhea (-) GU: dysuria (-), hematuria (-), incontinence (-) Musculoskeletal: joint swelling (-), joint pain (-), back pain (-) Neuro: weakness (-), numbness (-), headache (-), confusion (-) Skin: Rash (-), lesions (-), dryness (-) Psych: depression (-), suicidal/homicidal ideation (-), feeling of hopelessness (-)   PHYSICAL EXAMINATION: Blood pressure 159/77, pulse 64, temperature 97.7 F (36.5 C), temperature source Oral, resp. rate 20, height 5\' 5"  (1.651 m), weight 149 lb (67.586 kg). Body mass index is 24.79 kg/(m^2). General: Patient is a well appearing female in no acute distress HEENT: PERRLA, sclerae anicteric no conjunctival pallor, MMM Neck: supple, no palpable adenopathy Lungs: clear to auscultation bilaterally, no wheezes, rhonchi, or rales Cardiovascular: regular rate rhythm, S1, S2, no murmurs, rubs or  gallops Abdomen: Soft, non-tender, non-distended, normoactive bowel sounds, no HSM Extremities: warm and well perfused, no clubbing, cyanosis, mild right arm swelling Skin: No rashes or lesions Neuro: Non-focal Breasts: left lumpectomy site healing well, does have minimal swelling to the left breast--which has much improved, also, right breast without masses or nodularity.  Right port ecchymosis has resolved.   ECOG PERFORMANCE STATUS: 0 - Asymptomatic  LABORATORY DATA: Lab Results  Component Value Date   WBC 7.6 05/15/2013   HGB 13.0 05/15/2013   HCT 38.3 05/15/2013   MCV 86.1 05/15/2013   PLT 217 05/15/2013      Chemistry      Component Value Date/Time   NA 140 05/01/2013 1253   NA 144 02/10/2013 1245   K 3.9 05/01/2013 1253   K 4.0 02/10/2013 1245   CL 110* 04/10/2013 1020   CL 108 02/10/2013 1245   CO2  26 05/01/2013 1253   CO2 27 02/10/2013 1245   BUN 9.4 05/01/2013 1253   BUN 10 02/10/2013 1245   CREATININE 0.6 05/01/2013 1253   CREATININE 0.51 02/10/2013 1245      Component Value Date/Time   CALCIUM 10.0 05/01/2013 1253   CALCIUM 9.7 02/10/2013 1245   ALKPHOS 126 05/01/2013 1253   ALKPHOS 67 01/06/2013 2205   AST 14 05/01/2013 1253   AST 16 01/06/2013 2205   ALT 21 05/01/2013 1253   ALT 17 01/06/2013 2205   BILITOT 0.99 05/01/2013 1253   BILITOT 0.2* 01/06/2013 2205       RADIOGRAPHIC STUDIES:  Dg Chest Port 1 View  02/17/2013  *RADIOLOGY REPORT*  Clinical Data: Port placement  PORTABLE CHEST - 1 VIEW  Comparison: 09/11/2012  Findings: Port has been placed on the right, introduced from the subclavian vein.  The tip is in the SVC 4 cm above the right atrium.  No pneumothorax.  Lungs are clear.  IMPRESSION: Port well positioned with its tip in the SVC 4 cm above the right atrium.  No complication evident.   Original Report Authenticated By: Paulina Fusi, M.D.    Dg Fluoro Guide Cv Line-no Report  02/17/2013  CLINICAL DATA: portacath placement   FLOURO GUIDE CV LINE  Fluoroscopy was utilized by  the requesting physician.  No radiographic  interpretation.      ASSESSMENT: 62 year old female with   #1 new diagnosis of invasive ductal carcinoma that is triple negative measuring 1.4 cm node-negative. Patient is status post lumpectomy. Postoperatively she is doing well. Diagnosis code 174.2.  #2 patient and I discussed adjuvant therapy. She would be a candidate for adjuvant chemotherapy consisting of CMF given every 3 weeks for a total of 6 cycles. Risks and benefits of treatment were discussed with her. The dates of her chemotherapy include 4/24, 5/15, 6/5, 6/26, 7/17, 8/7.  She will also have appointments 7 days after her chemotherapy for labs and an MD/NP appointment for evaluation of chemotoxicities. She also occasionally requires Neulasta injections the day following chemotherapy due to mild neutropenia.     PLAN:   1. Ms. Brackney is doing well today.  Her labs are stable.  She will proceed with chemotherapy.  I requested a f/u appointment with Dr. Roselind Messier about adjuvant radiation therapy as she is nearing the end of chemotherapy.    2.  She will return in 1 week for labs and an evaluation for chemotoxicities.    3.She is planning on following up with her cardiologist about her hypertension on 06/09/13.    All questions were answered. The patient knows to call the clinic with any problems, questions or concerns. We can certainly see the patient much sooner if necessary.  I spent 25 minutes counseling the patient face to face. The total time spent in the appointment was 30 minutes.  Cherie Ouch Lyn Hollingshead, NP Medical Oncology [facility] Phone: [phone] 05/15/2013, 11:13 AM

## 2013-05-15 NOTE — Patient Instructions (Addendum)
Doing well.  Proceed with chemo.  Please call us if you have any questions or concerns.

## 2013-05-15 NOTE — Telephone Encounter (Signed)
gv pt appt schedule for July including appt w/Dr. Roselind Messier for 7/24.

## 2013-05-17 ENCOUNTER — Encounter (HOSPITAL_COMMUNITY): Payer: Self-pay

## 2013-05-17 ENCOUNTER — Observation Stay (HOSPITAL_COMMUNITY)
Admission: EM | Admit: 2013-05-17 | Discharge: 2013-05-19 | Disposition: A | Discharge status: Home / Self Care | Payer: BC Managed Care – PPO | Attending: Internal Medicine | Admitting: Internal Medicine

## 2013-05-17 ENCOUNTER — Emergency Department (HOSPITAL_COMMUNITY): Payer: BC Managed Care – PPO

## 2013-05-17 ENCOUNTER — Telehealth: Payer: Self-pay | Admitting: Hematology & Oncology

## 2013-05-17 DIAGNOSIS — C50219 Malignant neoplasm of upper-inner quadrant of unspecified female breast: Secondary | ICD-10-CM

## 2013-05-17 DIAGNOSIS — E785 Hyperlipidemia, unspecified: Secondary | ICD-10-CM

## 2013-05-17 DIAGNOSIS — H8110 Benign paroxysmal vertigo, unspecified ear: Secondary | ICD-10-CM

## 2013-05-17 DIAGNOSIS — Z1231 Encounter for screening mammogram for malignant neoplasm of breast: Secondary | ICD-10-CM

## 2013-05-17 DIAGNOSIS — R270 Ataxia, unspecified: Secondary | ICD-10-CM | POA: Diagnosis present

## 2013-05-17 DIAGNOSIS — Z8673 Personal history of transient ischemic attack (TIA), and cerebral infarction without residual deficits: Secondary | ICD-10-CM | POA: Insufficient documentation

## 2013-05-17 DIAGNOSIS — F4321 Adjustment disorder with depressed mood: Secondary | ICD-10-CM

## 2013-05-17 DIAGNOSIS — I110 Hypertensive heart disease with heart failure: Secondary | ICD-10-CM | POA: Diagnosis present

## 2013-05-17 DIAGNOSIS — F4320 Adjustment disorder, unspecified: Secondary | ICD-10-CM

## 2013-05-17 DIAGNOSIS — R55 Syncope and collapse: Principal | ICD-10-CM

## 2013-05-17 DIAGNOSIS — E119 Type 2 diabetes mellitus without complications: Secondary | ICD-10-CM

## 2013-05-17 DIAGNOSIS — Z7982 Long term (current) use of aspirin: Secondary | ICD-10-CM | POA: Insufficient documentation

## 2013-05-17 DIAGNOSIS — G243 Spasmodic torticollis: Secondary | ICD-10-CM

## 2013-05-17 DIAGNOSIS — Z79899 Other long term (current) drug therapy: Secondary | ICD-10-CM | POA: Insufficient documentation

## 2013-05-17 DIAGNOSIS — I252 Old myocardial infarction: Secondary | ICD-10-CM | POA: Insufficient documentation

## 2013-05-17 DIAGNOSIS — R110 Nausea: Secondary | ICD-10-CM | POA: Insufficient documentation

## 2013-05-17 DIAGNOSIS — G629 Polyneuropathy, unspecified: Secondary | ICD-10-CM

## 2013-05-17 DIAGNOSIS — I4729 Other ventricular tachycardia: Secondary | ICD-10-CM

## 2013-05-17 DIAGNOSIS — Z0000 Encounter for general adult medical examination without abnormal findings: Secondary | ICD-10-CM

## 2013-05-17 DIAGNOSIS — C50212 Malignant neoplasm of upper-inner quadrant of left female breast: Secondary | ICD-10-CM

## 2013-05-17 DIAGNOSIS — I639 Cerebral infarction, unspecified: Secondary | ICD-10-CM

## 2013-05-17 DIAGNOSIS — G9389 Other specified disorders of brain: Secondary | ICD-10-CM | POA: Insufficient documentation

## 2013-05-17 DIAGNOSIS — I6529 Occlusion and stenosis of unspecified carotid artery: Secondary | ICD-10-CM | POA: Insufficient documentation

## 2013-05-17 DIAGNOSIS — I472 Ventricular tachycardia: Secondary | ICD-10-CM

## 2013-05-17 DIAGNOSIS — I517 Cardiomegaly: Secondary | ICD-10-CM | POA: Insufficient documentation

## 2013-05-17 DIAGNOSIS — I2510 Atherosclerotic heart disease of native coronary artery without angina pectoris: Secondary | ICD-10-CM

## 2013-05-17 DIAGNOSIS — I10 Essential (primary) hypertension: Secondary | ICD-10-CM

## 2013-05-17 LAB — CBC WITH DIFFERENTIAL/PLATELET
Basophils Absolute: 0 10*3/uL (ref 0.0–0.1)
Basophils Relative: 0 % (ref 0–1)
Eosinophils Absolute: 0 10*3/uL (ref 0.0–0.7)
Eosinophils Relative: 0 % (ref 0–5)
HCT: 39.3 % (ref 36.0–46.0)
Hemoglobin: 13.4 g/dL (ref 12.0–15.0)
Lymphocytes Relative: 10 % — ABNORMAL LOW (ref 12–46)
Lymphs Abs: 1 10*3/uL (ref 0.7–4.0)
MCH: 29.8 pg (ref 26.0–34.0)
MCHC: 34.1 g/dL (ref 30.0–36.0)
MCV: 87.3 fL (ref 78.0–100.0)
Monocytes Absolute: 0.3 10*3/uL (ref 0.1–1.0)
Monocytes Relative: 3 % (ref 3–12)
Neutro Abs: 8.4 10*3/uL — ABNORMAL HIGH (ref 1.7–7.7)
Neutrophils Relative %: 87 % — ABNORMAL HIGH (ref 43–77)
Platelets: 270 10*3/uL (ref 150–400)
RBC: 4.5 MIL/uL (ref 3.87–5.11)
RDW: 15.7 % — ABNORMAL HIGH (ref 11.5–15.5)
WBC: 9.7 10*3/uL (ref 4.0–10.5)

## 2013-05-17 LAB — POCT I-STAT, CHEM 8
BUN: 14 mg/dL (ref 6–23)
Calcium, Ion: 1.25 mmol/L (ref 1.13–1.30)
Chloride: 106 mEq/L (ref 96–112)
Creatinine, Ser: 0.6 mg/dL (ref 0.50–1.10)
Glucose, Bld: 169 mg/dL — ABNORMAL HIGH (ref 70–99)
HCT: 39 % (ref 36.0–46.0)
Hemoglobin: 13.3 g/dL (ref 12.0–15.0)
Potassium: 4.2 mEq/L (ref 3.5–5.1)
Sodium: 141 mEq/L (ref 135–145)
TCO2: 24 mmol/L (ref 0–100)

## 2013-05-17 LAB — POCT I-STAT TROPONIN I
Troponin i, poc: 0.01 ng/mL (ref 0.00–0.08)
Troponin i, poc: 0.01 ng/mL (ref 0.00–0.08)

## 2013-05-17 LAB — GLUCOSE, CAPILLARY
Glucose-Capillary: 171 mg/dL — ABNORMAL HIGH (ref 70–99)
Glucose-Capillary: 161 mg/dL — ABNORMAL HIGH (ref 70–99)

## 2013-05-17 LAB — CREATININE, SERUM
Creatinine, Ser: 0.43 mg/dL — ABNORMAL LOW (ref 0.50–1.10)
GFR calc Af Amer: >90 mL/min (ref >90)
GFR calc non Af Amer: >90 mL/min (ref >90)

## 2013-05-17 LAB — CK TOTAL AND CKMB (NOT AT ARMC)
CK, MB: 3 ng/mL (ref 0.3–4.0)
Relative Index: INVALID (ref 0.0–2.5)
Total CK: 47 U/L (ref 7–177)

## 2013-05-17 LAB — TROPONIN I: Troponin I: <0.3 ng/mL (ref <0.30)

## 2013-05-17 MED ORDER — ACETAMINOPHEN 325 MG PO TABS: 650.0000 mg | ORAL_TABLET | Freq: Four times a day (QID) | ORAL | Status: DC | PRN

## 2013-05-17 MED ORDER — ISOSORBIDE MONONITRATE ER 60 MG PO TB24
60.0000 mg | ORAL_TABLET | Freq: Every day | ORAL | Status: DC
  Administered 2013-05-18 – 2013-05-19 (×2): 60 mg via ORAL
  Filled 2013-05-17 (×2): qty 1

## 2013-05-17 MED ORDER — DIAZEPAM 5 MG PO TABS: 5.0000 mg | ORAL_TABLET | Freq: Four times a day (QID) | ORAL | Status: DC | PRN

## 2013-05-17 MED ORDER — LISINOPRIL 20 MG PO TABS
40.0000 mg | ORAL_TABLET | Freq: Every day | ORAL | Status: DC
  Administered 2013-05-18 – 2013-05-19 (×2): 40 mg via ORAL
  Filled 2013-05-17 (×2): qty 2

## 2013-05-17 MED ORDER — ATORVASTATIN CALCIUM 80 MG PO TABS
80.0000 mg | ORAL_TABLET | Freq: Every day | ORAL | Status: DC
  Administered 2013-05-17 – 2013-05-19 (×3): 80 mg via ORAL
  Filled 2013-05-17 (×3): qty 1

## 2013-05-17 MED ORDER — INSULIN GLARGINE 100 UNIT/ML ~~LOC~~ SOLN
31.0000 [IU] | Freq: Every day | SUBCUTANEOUS | Status: DC
  Administered 2013-05-17 – 2013-05-18 (×2): 31 [IU] via SUBCUTANEOUS
  Filled 2013-05-17 (×3): qty 0.31

## 2013-05-17 MED ORDER — METFORMIN HCL ER 750 MG PO TB24
750.0000 mg | ORAL_TABLET | Freq: Every day | ORAL | Status: DC
  Administered 2013-05-18 – 2013-05-19 (×2): 750 mg via ORAL
  Filled 2013-05-17 (×3): qty 1

## 2013-05-17 MED ORDER — TICAGRELOR 90 MG PO TABS
90.0000 mg | ORAL_TABLET | Freq: Two times a day (BID) | ORAL | Status: DC
  Administered 2013-05-17 – 2013-05-19 (×4): 90 mg via ORAL
  Filled 2013-05-17 (×5): qty 1

## 2013-05-17 MED ORDER — OXYCODONE-ACETAMINOPHEN 5-325 MG PO TABS: 1.0000 | ORAL_TABLET | ORAL | Status: DC | PRN

## 2013-05-17 MED ORDER — INSULIN ASPART 100 UNIT/ML ~~LOC~~ SOLN
10.0000 [IU] | Freq: Three times a day (TID) | SUBCUTANEOUS | Status: DC
  Administered 2013-05-18 (×3): 10 [IU] via SUBCUTANEOUS

## 2013-05-17 MED ORDER — AMLODIPINE BESYLATE 2.5 MG PO TABS
2.5000 mg | ORAL_TABLET | Freq: Every day | ORAL | Status: DC
  Administered 2013-05-18 – 2013-05-19 (×2): 2.5 mg via ORAL
  Filled 2013-05-17 (×2): qty 1

## 2013-05-17 MED ORDER — ASPIRIN EC 81 MG PO TBEC
81.0000 mg | DELAYED_RELEASE_TABLET | Freq: Every day | ORAL | Status: DC
  Administered 2013-05-17 – 2013-05-19 (×3): 81 mg via ORAL
  Filled 2013-05-17 (×3): qty 1

## 2013-05-17 MED ORDER — ONDANSETRON HCL 4 MG PO TABS: 4.0000 mg | ORAL_TABLET | Freq: Three times a day (TID) | ORAL | Status: DC | PRN

## 2013-05-17 MED ORDER — METOPROLOL TARTRATE 50 MG PO TABS: 50.0000 mg | ORAL_TABLET | Freq: Two times a day (BID) | ORAL | Status: DC

## 2013-05-17 MED ORDER — ENOXAPARIN SODIUM 40 MG/0.4ML ~~LOC~~ SOLN
40.0000 mg | Freq: Every day | SUBCUTANEOUS | Status: DC
  Administered 2013-05-17 – 2013-05-18 (×2): 40 mg via SUBCUTANEOUS
  Filled 2013-05-17 (×3): qty 0.4

## 2013-05-17 MED ORDER — METOPROLOL TARTRATE 50 MG PO TABS
50.0000 mg | ORAL_TABLET | Freq: Every day | ORAL | Status: DC
  Administered 2013-05-17 – 2013-05-18 (×2): 50 mg via ORAL
  Filled 2013-05-17 (×3): qty 1

## 2013-05-17 MED ORDER — ACETAMINOPHEN 650 MG RE SUPP: 650.0000 mg | Freq: Four times a day (QID) | RECTAL | Status: DC | PRN

## 2013-05-17 MED ORDER — METOPROLOL TARTRATE 50 MG PO TABS
75.0000 mg | ORAL_TABLET | Freq: Every day | ORAL | Status: DC
  Administered 2013-05-18 – 2013-05-19 (×2): 75 mg via ORAL
  Filled 2013-05-17 (×2): qty 1

## 2013-05-17 MED ORDER — SODIUM CHLORIDE 0.9 % IJ SOLN
3.0000 mL | Freq: Two times a day (BID) | INTRAMUSCULAR | Status: DC
Start: 2013-05-17 — End: 2013-05-19
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
## 2013-05-18 ENCOUNTER — Other Ambulatory Visit: Payer: Self-pay

## 2013-05-18 ENCOUNTER — Encounter (HOSPITAL_COMMUNITY): Payer: Self-pay

## 2013-05-18 ENCOUNTER — Observation Stay (HOSPITAL_COMMUNITY): Payer: BC Managed Care – PPO

## 2013-05-18 DIAGNOSIS — I10 Essential (primary) hypertension: Secondary | ICD-10-CM

## 2013-05-18 DIAGNOSIS — I517 Cardiomegaly: Secondary | ICD-10-CM

## 2013-05-18 DIAGNOSIS — R55 Syncope and collapse: Secondary | ICD-10-CM

## 2013-05-18 LAB — GLUCOSE, CAPILLARY
Glucose-Capillary: 140 mg/dL — ABNORMAL HIGH (ref 70–99)
Glucose-Capillary: 95 mg/dL (ref 70–99)
Glucose-Capillary: 102 mg/dL — ABNORMAL HIGH (ref 70–99)
Glucose-Capillary: 93 mg/dL (ref 70–99)

## 2013-05-18 LAB — TROPONIN I
Troponin I: <0.3 ng/mL (ref <0.30)
Troponin I: <0.3 ng/mL (ref <0.30)

## 2013-05-18 MED ORDER — SODIUM CHLORIDE 0.9 % IJ SOLN
10.0000 mL | INTRAMUSCULAR | Status: DC | PRN
  Administered 2013-05-19: 10 mL

## 2013-05-18 MED ORDER — MECLIZINE HCL 12.5 MG PO TABS
12.5000 mg | ORAL_TABLET | Freq: Three times a day (TID) | ORAL | Status: DC | PRN
  Administered 2013-05-18 – 2013-05-19 (×3): 12.5 mg via ORAL
  Filled 2013-05-18 (×4): qty 1
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
## 2013-05-18 NOTE — Progress Notes (Signed)
  Echocardiogram 2D Echocardiogram has been performed.  Marisa Forbes M 05/18/2013, 11:56 AM

## 2013-05-18 NOTE — Progress Notes (Signed)
*  PRELIMINARY RESULTS* Vascular Ultrasound Carotid Duplex (Doppler) has been completed.   There is no obvious evidence of hemodynamically significant internal carotid artery stenosis >40%. Vertebral arteries are patent with antegrade flow.  05/18/2013 9:34 AM Gertie Fey, RVT, RDCS, RDMS
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
## 2013-05-18 NOTE — Progress Notes (Signed)
0500- Pt got up to BR, voided 600 ml without problems.  After back to bed, when laid back flat in bed, pt became dizzy.  Denied nausea and dizziness passed in about a minute.  BP 186/80 right after dizziness passed, HR 62.  No rhythm abnormalities noted.  Berton Bon RN-BC

## 2013-05-19 DIAGNOSIS — H8110 Benign paroxysmal vertigo, unspecified ear: Secondary | ICD-10-CM

## 2013-05-19 LAB — BASIC METABOLIC PANEL
BUN: 12 mg/dL (ref 6–23)
CO2: 26 mEq/L (ref 19–32)
Calcium: 9 mg/dL (ref 8.4–10.5)
Chloride: 109 mEq/L (ref 96–112)
Creatinine, Ser: 0.45 mg/dL — ABNORMAL LOW (ref 0.50–1.10)
GFR calc Af Amer: >90 mL/min (ref >90)
GFR calc non Af Amer: >90 mL/min (ref >90)
Glucose, Bld: 86 mg/dL (ref 70–99)
Potassium: 3.4 mEq/L — ABNORMAL LOW (ref 3.5–5.1)
Sodium: 141 mEq/L (ref 135–145)

## 2013-05-19 LAB — CBC
HCT: 34.8 % — ABNORMAL LOW (ref 36.0–46.0)
Hemoglobin: 12 g/dL (ref 12.0–15.0)
MCH: 29.8 pg (ref 26.0–34.0)
MCHC: 34.5 g/dL (ref 30.0–36.0)
MCV: 86.4 fL (ref 78.0–100.0)
Platelets: 208 10*3/uL (ref 150–400)
RBC: 4.03 MIL/uL (ref 3.87–5.11)
RDW: 15.4 % (ref 11.5–15.5)
WBC: 3.2 10*3/uL — ABNORMAL LOW (ref 4.0–10.5)

## 2013-05-19 LAB — GLUCOSE, CAPILLARY: Glucose-Capillary: 83 mg/dL (ref 70–99)

## 2013-05-19 MED ORDER — POTASSIUM CHLORIDE CRYS ER 20 MEQ PO TBCR
40.0000 meq | EXTENDED_RELEASE_TABLET | Freq: Once | ORAL | Status: AC
  Administered 2013-05-19: 40 meq via ORAL
  Filled 2013-05-19: qty 2

## 2013-05-19 MED ORDER — MECLIZINE HCL 12.5 MG PO TABS
12.5000 mg | ORAL_TABLET | Freq: Three times a day (TID) | ORAL | Status: DC
  Administered 2013-05-19: 12.5 mg via ORAL
  Filled 2013-05-19 (×2): qty 1

## 2013-05-19 MED ORDER — HEPARIN SOD (PORK) LOCK FLUSH 100 UNIT/ML IV SOLN
500.0000 [IU] | INTRAVENOUS | Status: AC | PRN
  Administered 2013-05-19: 500 [IU]

## 2013-05-19 MED ORDER — MECLIZINE HCL 12.5 MG PO TABS: 12.5000 mg | ORAL_TABLET | Freq: Three times a day (TID) | ORAL | Status: DC
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
## 2013-05-19 NOTE — Progress Notes (Signed)
Call to [facility] to inquire on results of Echo, spoke with Melissa, unable to find results, and she will page Dr. Ailene Ards as he is the reader today.
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
## 2013-05-19 NOTE — Care Management Note (Addendum)
    Page 1 of 1   05/19/2013     3:05:40 PM   CARE MANAGEMENT NOTE 05/19/2013  Patient:  Marisa Forbes, Marisa Forbes   Account Number:  [id number]  Date Initiated:  05/19/2013  Documentation initiated by:  Lanier Clam  Subjective/Objective Assessment:   ADMITTED W/SYNCOPE.     Action/Plan:   FROM HOME.HAS PCP.PHARMACY.   Anticipated DC Date:  05/19/2013   Anticipated DC Plan:  HOME/SELF CARE      DC Planning Services  CM consult  OP Neuro Rehab      Choice offered to / List presented to:             Status of service:  Completed, signed off Medicare Important Message given?   (If response is "NO", the following Medicare IM given date fields will be blank) Date Medicare IM given:   Date Additional Medicare IM given:    Discharge Disposition:  HOME/SELF CARE  Per UR Regulation:  Reviewed for med. necessity/level of care/duration of stay  If discussed at Long Length of Stay Meetings, dates discussed:    Comments:  05/19/13 Inger Wiest RN,BSN NCM [phone] PT-OTPT VESTBIULAR REHAB.FAXED W/CONFIRMATION OTPT VESTIBULAR REHAB SIGNED FORM TO [phone] [id number].THEY WILL CONTACT PATIENT TO SET UP APPT.NO FURTHER D/C NEEDS.
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
## 2013-05-20 LAB — GLUCOSE, CAPILLARY: Glucose-Capillary: 114 mg/dL — ABNORMAL HIGH (ref 70–99)

## 2013-05-21 ENCOUNTER — Encounter: Payer: Self-pay | Admitting: Radiation Oncology
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
## 2013-05-22 ENCOUNTER — Ambulatory Visit (HOSPITAL_BASED_OUTPATIENT_CLINIC_OR_DEPARTMENT_OTHER): Payer: BC Managed Care – PPO | Admitting: Adult Health

## 2013-05-22 ENCOUNTER — Other Ambulatory Visit (HOSPITAL_BASED_OUTPATIENT_CLINIC_OR_DEPARTMENT_OTHER): Payer: BC Managed Care – PPO | Admitting: Lab

## 2013-05-22 ENCOUNTER — Ambulatory Visit
Admission: RE | Admit: 2013-05-22 | Discharge: 2013-05-22 | Disposition: A | Discharge status: Home / Self Care | Payer: BC Managed Care – PPO | Source: Ambulatory Visit | Attending: Radiation Oncology | Admitting: Radiation Oncology

## 2013-05-22 ENCOUNTER — Encounter: Payer: Self-pay | Admitting: Adult Health

## 2013-05-22 ENCOUNTER — Ambulatory Visit: Payer: BC Managed Care – PPO | Attending: Internal Medicine | Admitting: Physical Therapy

## 2013-05-22 ENCOUNTER — Encounter: Payer: Self-pay | Admitting: Radiation Oncology

## 2013-05-22 ENCOUNTER — Telehealth: Payer: Self-pay | Admitting: *Deleted

## 2013-05-22 VITALS — BP 173/91 | HR 67 | Temp 97.9°F | Resp 20 | Ht 65.0 in | Wt 148.0 lb

## 2013-05-22 VITALS — BP 183/87 | HR 61 | Temp 98.2°F | Resp 20 | Wt 149.6 lb

## 2013-05-22 DIAGNOSIS — C50919 Malignant neoplasm of unspecified site of unspecified female breast: Secondary | ICD-10-CM | POA: Insufficient documentation

## 2013-05-22 DIAGNOSIS — IMO0001 Reserved for inherently not codable concepts without codable children: Secondary | ICD-10-CM | POA: Insufficient documentation

## 2013-05-22 DIAGNOSIS — C50212 Malignant neoplasm of upper-inner quadrant of left female breast: Secondary | ICD-10-CM

## 2013-05-22 DIAGNOSIS — C50219 Malignant neoplasm of upper-inner quadrant of unspecified female breast: Secondary | ICD-10-CM

## 2013-05-22 DIAGNOSIS — C50912 Malignant neoplasm of unspecified site of left female breast: Secondary | ICD-10-CM

## 2013-05-22 DIAGNOSIS — H8110 Benign paroxysmal vertigo, unspecified ear: Secondary | ICD-10-CM | POA: Insufficient documentation

## 2013-05-22 LAB — CBC WITH DIFFERENTIAL/PLATELET
BASO%: 0.9 % (ref 0.0–2.0)
Basophils Absolute: 0 10*3/uL (ref 0.0–0.1)
EOS%: 1.6 % (ref 0.0–7.0)
Eosinophils Absolute: 0 10*3/uL (ref 0.0–0.5)
HCT: 37.1 % (ref 34.8–46.6)
HGB: 12.7 g/dL (ref 11.6–15.9)
LYMPH%: 12.8 % — ABNORMAL LOW (ref 14.0–49.7)
MCH: 30 pg (ref 25.1–34.0)
MCHC: 34.1 g/dL (ref 31.5–36.0)
MCV: 87.9 fL (ref 79.5–101.0)
MONO#: 0.1 10*3/uL (ref 0.1–0.9)
MONO%: 3.4 % (ref 0.0–14.0)
NEUT#: 2.3 10*3/uL (ref 1.5–6.5)
NEUT%: 81.3 % — ABNORMAL HIGH (ref 38.4–76.8)
Platelets: 187 10*3/uL (ref 145–400)
RBC: 4.23 10*6/uL (ref 3.70–5.45)
RDW: 16.3 % — ABNORMAL HIGH (ref 11.2–14.5)
WBC: 2.8 10*3/uL — ABNORMAL LOW (ref 3.9–10.3)
lymph#: 0.4 10*3/uL — ABNORMAL LOW (ref 0.9–3.3)

## 2013-05-22 LAB — COMPREHENSIVE METABOLIC PANEL (CC13)
ALT: 30 U/L (ref 0–55)
AST: 15 U/L (ref 5–34)
Albumin: 3.5 g/dL (ref 3.5–5.0)
Alkaline Phosphatase: 82 U/L (ref 40–150)
BUN: 9.5 mg/dL (ref 7.0–26.0)
CO2: 26 mEq/L (ref 22–29)
Calcium: 9.5 mg/dL (ref 8.4–10.4)
Chloride: 104 mEq/L (ref 98–109)
Creatinine: 0.7 mg/dL (ref 0.6–1.1)
Glucose: 250 mg/dl — ABNORMAL HIGH (ref 70–140)
Potassium: 4 mEq/L (ref 3.5–5.1)
Sodium: 140 mEq/L (ref 136–145)
Total Bilirubin: 0.71 mg/dL (ref 0.20–1.20)
Total Protein: 6.2 g/dL — ABNORMAL LOW (ref 6.4–8.3)
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
## 2013-05-22 NOTE — Addendum Note (Signed)
Encounter addended by: Glennie Hawk, RN on: 05/22/2013  2:13 PM<BR>     Documentation filed: Charges VN

## 2013-05-22 NOTE — Telephone Encounter (Signed)
appts made and printed...td 

## 2013-05-22 NOTE — Progress Notes (Signed)
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 Patient was initially seen in the multidisciplinary breast clinic on 12/11/2012. Since that time the patient has undergone her definitive surgery and is currently near completion of her adjuvant chemotherapy. In light ofThe patient's coronary artery disease,  she is being treated with CMF.  the patient will complete her last cycle of chemotherapy on August 7. She denies any pain in the left breast area nipple discharge or bleeding. She denies any problems with swelling in her left arm or hand.                              ALLERGIES:  has No Known Allergies.  Meds: Current Outpatient Prescriptions  Medication Sig Dispense Refill  . amLODipine (NORVASC) 2.5 MG tablet Take 1 tablet (2.5 mg total) by mouth daily.  90 tablet  3  . aspirin EC 81 MG EC tablet Take 1 tablet (81 mg total) by mouth daily.      Marland Kitchen atorvastatin (LIPITOR) 80 MG tablet TAKE 1 TABLET BY MOUTH DAILY AT 6:00 PM  30 tablet  7  . diazepam (VALIUM) 5 MG tablet Take 1 tablet (5 mg total) by mouth every 6 (six) hours as needed for anxiety.  60 tablet  1  . glucose blood (ONETOUCH VERIO) test strip Use TID  100 each  12  . ibuprofen (ADVIL,MOTRIN) 200 MG tablet Take 200 mg by mouth every 6 (six) hours as needed. For pain      . insulin aspart (NOVOLOG) 100 UNIT/ML injection Inject 10 Units into the skin 3 (three) times daily with meals.  1 vial  11  . insulin glargine (LANTUS SOLOSTAR) 100 UNIT/ML injection Inject 31 Units into the skin at bedtime.  9 mL  12  . isosorbide mononitrate (IMDUR) 60 MG 24 hr tablet TAKE 1 TABLET BY MOUTH DAILY  30 tablet  6  .  lidocaine-prilocaine (EMLA) cream Apply topically as needed. Apply to port 1-2 hours before procedure  30 g  1  . lisinopril (PRINIVIL,ZESTRIL) 40 MG tablet Take 40 mg by mouth daily.      . meclizine (ANTIVERT) 12.5 MG tablet Take 1 tablet (12.5 mg total) by mouth 3 (three) times daily.  21 tablet  0  . metFORMIN (GLUCOPHAGE-XR) 750 MG 24 hr tablet Take 1 tablet (750 mg total) by mouth daily with breakfast.  30 tablet  5  . metoprolol (LOPRESSOR) 50 MG tablet Take 50-75 mg by mouth 2 (two) times daily. TAKE 3 TABLETS IN THE MORNING AND 2 TABLETS IN THE EVENING      . nitroGLYCERIN (NITROSTAT) 0.4 MG SL tablet Place 0.4 mg under the tongue every 5 (five) minutes as needed for chest pain.      Marland Kitchen ondansetron (ZOFRAN) 8 MG tablet Take 1 tablet by mouth 2 times daily starting the day after chemo for 2 days, then take 2 times a day as needed for N/V  30 tablet  1  . oxyCODONE-acetaminophen (ROXICET) 5-325 MG per tablet Take 1 tablet by mouth every 4 (four) hours as needed for pain.  10  tablet  0  . PRESCRIPTION MEDICATION every 21 ( twenty-one) days. CHEMOTHERAPY [facility]      . Ticagrelor (BRILINTA) 90 MG TABS tablet Take 1 tablet (90 mg total) by mouth 2 (two) times daily.  60 tablet  11   No current facility-administered medications for this encounter.    Physical Findings: The patient is in no acute distress. Patient is alert and oriented.  weight is 149 lb 9.6 oz (67.858 kg). Her oral temperature is 98.2 F (36.8 C). Her blood pressure is 183/87 and her pulse is 61. Her respiration is 20. Marland Kitchen  No palpable supraclavicular or axillary adenopathy. The lungs are clear to auscultation. The heart has a regular rhythm and rate. Summation of the right breast reveals a to be pendulous without mass or nipple discharge. Examination of the left breast reveals it to also be pendulous without mass or nipple discharge. There is some induration at the lumpectomy site in the upper outer quadrant but no dominant masses  appreciated in the breast.   Lab Findings: Lab Results  Component Value Date   WBC 2.8* 05/22/2013   HGB 12.7 05/22/2013   HCT 37.1 05/22/2013   MCV 87.9 05/22/2013   PLT 187 05/22/2013      Radiographic Findings: Ct Head Wo Contrast  05/17/2013   *RADIOLOGY REPORT*  Clinical Data: Syncope  CT HEAD WITHOUT CONTRAST  Technique:  Contiguous axial images were obtained from the base of the skull through the vertex without contrast.  Comparison: Prior CT from 09/13/2012  Findings: Encephalomalacia within the right PCA territory is again seen, stable as compared to the prior examination. Probable remote lacunar infarct within the right centrum [place] is also similar. No acute intracranial hemorrhage or infarct identified. There is no midline shift or mass lesion.  No extra-axial fluid collection.  Calvarium is intact.  Scattered calcified atheromatous disease is seen within the intracranial circulation, similar to prior.  The visualized mastoid air cells and paranasal sinuses are clear. Orbits are intact. Subcutaneous subcentimeter soft tissue nodules within the parietal scalp are unchanged.  IMPRESSION: 1.  No acute intracranial process.  2.  Encephalomalacia within the right PCA territory, consistent with remote right PCA infarct, unchanged.   Original Report Authenticated By: Rise Mu, M.D.   Dg Chest Port 1 View  05/18/2013   *RADIOLOGY REPORT*  Clinical Data: Syncope, history breast cancer  PORTABLE CHEST - 1 VIEW  Comparison: 02/17/2013  Findings: Lungs are essentially clear.  No focal consolidation.  No pleural effusion or pneumothorax.  Mild cardiomegaly.  Stable right subclavian chest port.  Postsurgical changes in the left breast.  IMPRESSION: No evidence of acute cardiopulmonary disease.   Original Report Authenticated By: Charline Bills, M.D.    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 Patient would be a good candidate for breast  conservation with radiation therapy directed the left breast area. I discussed the treatment course side effects and potential toxicities of radiation therapy in this situation with the patient and her husband. She appears to understand and wishes to proceed with planned course of treatment.  Plan:  Simulation and planning on August 14 at 10 AM. Patient will begin her radiation therapy approximately 3 weeks after her last cycle of chemotherapy.  _____________________________________  -----------------------------------  Billie Lade, PhD, MD

## 2013-05-22 NOTE — Patient Instructions (Signed)
Doing well.  Labs are stable.  We will see you in 2 weeks for your next cycle of chemotherapy.

## 2013-05-22 NOTE — Progress Notes (Signed)
Please see the Nurse Progress Note in the MD Initial Consult Encounter for this patient. 

## 2013-05-23 ENCOUNTER — Ambulatory Visit: Payer: BC Managed Care – PPO | Admitting: Rehabilitative and Restorative Service Providers"

## 2013-05-27 ENCOUNTER — Ambulatory Visit: Payer: BC Managed Care – PPO | Admitting: Rehabilitative and Restorative Service Providers"

## 2013-05-29 ENCOUNTER — Other Ambulatory Visit: Payer: Self-pay | Admitting: *Deleted

## 2013-05-29 ENCOUNTER — Ambulatory Visit: Payer: BC Managed Care – PPO | Admitting: Rehabilitative and Restorative Service Providers"

## 2013-05-29 ENCOUNTER — Encounter: Payer: Self-pay | Admitting: Internal Medicine

## 2013-05-29 MED ORDER — TICAGRELOR 90 MG PO TABS: 90.0000 mg | ORAL_TABLET | Freq: Two times a day (BID) | ORAL | Status: DC

## 2013-05-29 NOTE — Telephone Encounter (Signed)
Rx was sent to pharmacy electronically. 

## 2013-06-02 ENCOUNTER — Ambulatory Visit: Payer: BC Managed Care – PPO | Admitting: Physical Therapy

## 2013-06-02 ENCOUNTER — Ambulatory Visit: Payer: BC Managed Care – PPO | Attending: Internal Medicine | Admitting: Physical Therapy

## 2013-06-02 DIAGNOSIS — H8110 Benign paroxysmal vertigo, unspecified ear: Secondary | ICD-10-CM | POA: Insufficient documentation

## 2013-06-02 DIAGNOSIS — IMO0001 Reserved for inherently not codable concepts without codable children: Secondary | ICD-10-CM | POA: Insufficient documentation

## 2013-06-03 ENCOUNTER — Encounter: Payer: BC Managed Care – PPO | Admitting: Physical Therapy

## 2013-06-05 ENCOUNTER — Encounter: Payer: Self-pay | Admitting: Adult Health

## 2013-06-05 ENCOUNTER — Other Ambulatory Visit: Payer: Self-pay | Admitting: Internal Medicine

## 2013-06-05 ENCOUNTER — Other Ambulatory Visit (HOSPITAL_BASED_OUTPATIENT_CLINIC_OR_DEPARTMENT_OTHER): Payer: BC Managed Care – PPO | Admitting: Lab

## 2013-06-05 ENCOUNTER — Ambulatory Visit (HOSPITAL_BASED_OUTPATIENT_CLINIC_OR_DEPARTMENT_OTHER): Payer: BC Managed Care – PPO

## 2013-06-05 ENCOUNTER — Ambulatory Visit (HOSPITAL_BASED_OUTPATIENT_CLINIC_OR_DEPARTMENT_OTHER): Payer: BC Managed Care – PPO | Admitting: Adult Health

## 2013-06-05 ENCOUNTER — Telehealth: Payer: Self-pay | Admitting: Oncology

## 2013-06-05 VITALS — BP 145/80 | HR 58 | Temp 97.9°F | Resp 18 | Ht 65.0 in | Wt 150.8 lb

## 2013-06-05 DIAGNOSIS — C50919 Malignant neoplasm of unspecified site of unspecified female breast: Secondary | ICD-10-CM

## 2013-06-05 DIAGNOSIS — Z5111 Encounter for antineoplastic chemotherapy: Secondary | ICD-10-CM

## 2013-06-05 DIAGNOSIS — D709 Neutropenia, unspecified: Secondary | ICD-10-CM

## 2013-06-05 DIAGNOSIS — C50212 Malignant neoplasm of upper-inner quadrant of left female breast: Secondary | ICD-10-CM

## 2013-06-05 DIAGNOSIS — R42 Dizziness and giddiness: Secondary | ICD-10-CM

## 2013-06-05 DIAGNOSIS — Z171 Estrogen receptor negative status [ER-]: Secondary | ICD-10-CM

## 2013-06-05 DIAGNOSIS — C50219 Malignant neoplasm of upper-inner quadrant of unspecified female breast: Secondary | ICD-10-CM

## 2013-06-05 LAB — CBC WITH DIFFERENTIAL/PLATELET
BASO%: 0.4 % (ref 0.0–2.0)
Basophils Absolute: 0 10*3/uL (ref 0.0–0.1)
EOS%: 4.6 % (ref 0.0–7.0)
Eosinophils Absolute: 0.1 10*3/uL (ref 0.0–0.5)
HCT: 39.5 % (ref 34.8–46.6)
HGB: 13.4 g/dL (ref 11.6–15.9)
LYMPH%: 34.7 % (ref 14.0–49.7)
MCH: 29.8 pg (ref 25.1–34.0)
MCHC: 33.9 g/dL (ref 31.5–36.0)
MCV: 88 fL (ref 79.5–101.0)
MONO#: 0.5 10*3/uL (ref 0.1–0.9)
MONO%: 16.8 % — ABNORMAL HIGH (ref 0.0–14.0)
NEUT#: 1.2 10*3/uL — ABNORMAL LOW (ref 1.5–6.5)
NEUT%: 43.5 % (ref 38.4–76.8)
Platelets: 196 10*3/uL (ref 145–400)
RBC: 4.49 10*6/uL (ref 3.70–5.45)
RDW: 15 % — ABNORMAL HIGH (ref 11.2–14.5)
WBC: 2.9 10*3/uL — ABNORMAL LOW (ref 3.9–10.3)
lymph#: 1 10*3/uL (ref 0.9–3.3)
nRBC: 0 % (ref 0–0)

## 2013-06-05 LAB — COMPREHENSIVE METABOLIC PANEL (CC13)
ALT: 31 U/L (ref 0–55)
AST: 18 U/L (ref 5–34)
Albumin: 3.5 g/dL (ref 3.5–5.0)
Alkaline Phosphatase: 88 U/L (ref 40–150)
BUN: 13.1 mg/dL (ref 7.0–26.0)
CO2: 25 mEq/L (ref 22–29)
Calcium: 9.1 mg/dL (ref 8.4–10.4)
Chloride: 109 mEq/L (ref 98–109)
Creatinine: 0.7 mg/dL (ref 0.6–1.1)
Glucose: 250 mg/dl — ABNORMAL HIGH (ref 70–140)
Potassium: 4.1 mEq/L (ref 3.5–5.1)
Sodium: 141 mEq/L (ref 136–145)
Total Bilirubin: 0.62 mg/dL (ref 0.20–1.20)
Total Protein: 6.4 g/dL (ref 6.4–8.3)

## 2013-06-05 MED ORDER — DEXAMETHASONE SODIUM PHOSPHATE 10 MG/ML IJ SOLN
10.0000 mg | Freq: Once | INTRAMUSCULAR | Status: AC
  Administered 2013-06-05: 10 mg via INTRAVENOUS

## 2013-06-05 MED ORDER — FLUOROURACIL CHEMO INJECTION 2.5 GM/50ML
600.0000 mg/m2 | Freq: Once | INTRAVENOUS | Status: AC
  Administered 2013-06-05: 1050 mg via INTRAVENOUS
  Filled 2013-06-05: qty 21

## 2013-06-05 MED ORDER — LORAZEPAM 2 MG/ML IJ SOLN
0.5000 mg | Freq: Once | INTRAMUSCULAR | Status: AC
  Administered 2013-06-05: 0.5 mg via INTRAVENOUS

## 2013-06-05 MED ORDER — SODIUM CHLORIDE 0.9 % IV SOLN
600.0000 mg/m2 | Freq: Once | INTRAVENOUS | Status: AC
  Administered 2013-06-05: 1040 mg via INTRAVENOUS
  Filled 2013-06-05: qty 52

## 2013-06-05 MED ORDER — SODIUM CHLORIDE 0.9 % IV SOLN
Freq: Once | INTRAVENOUS | Status: AC
  Administered 2013-06-05: 12:00:00 via INTRAVENOUS

## 2013-06-05 MED ORDER — ONDANSETRON 8 MG/50ML IVPB (CHCC)
8.0000 mg | Freq: Once | INTRAVENOUS | Status: AC
Start: 2013-06-05 — End: 2013-06-05
  Administered 2013-06-05: 8 mg via INTRAVENOUS

## 2013-06-05 MED ORDER — HEPARIN SOD (PORK) LOCK FLUSH 100 UNIT/ML IV SOLN
500.0000 [IU] | Freq: Once | INTRAVENOUS | Status: AC | PRN
  Administered 2013-06-05: 500 [IU]
  Filled 2013-06-05: qty 5

## 2013-06-05 MED ORDER — SODIUM CHLORIDE 0.9 % IJ SOLN
10.0000 mL | INTRAMUSCULAR | Status: DC | PRN
  Administered 2013-06-05: 10 mL
  Filled 2013-06-05: qty 10

## 2013-06-05 MED ORDER — METHOTREXATE SODIUM CHEMO INJECTION 25 MG/ML
40.0000 mg/m2 | Freq: Once | INTRAMUSCULAR | Status: AC
  Administered 2013-06-05: 70 mg via INTRAVENOUS
  Filled 2013-06-05: qty 2.8

## 2013-06-05 NOTE — Patient Instructions (Addendum)
[facility] Discharge Instructions for Patients Receiving Chemotherapy  Today you received the following chemotherapy agents :  Methotrexate, 5FU, Cytoxan.  To help prevent nausea and vomiting after your treatment, we encourage you to take your nausea medication as instructed by your physician.    If you develop nausea and vomiting that is not controlled by your nausea medication, call the clinic.   BELOW ARE SYMPTOMS THAT SHOULD BE REPORTED IMMEDIATELY:  *FEVER GREATER THAN 100.5 F  *CHILLS WITH OR WITHOUT FEVER  NAUSEA AND VOMITING THAT IS NOT CONTROLLED WITH YOUR NAUSEA MEDICATION  *UNUSUAL SHORTNESS OF BREATH  *UNUSUAL BRUISING OR BLEEDING  TENDERNESS IN MOUTH AND THROAT WITH OR WITHOUT PRESENCE OF ULCERS  *URINARY PROBLEMS  *BOWEL PROBLEMS  UNUSUAL RASH Items with * indicate a potential emergency and should be followed up as soon as possible.  Feel free to call the clinic you have any questions or concerns. The clinic phone number is [phone].

## 2013-06-05 NOTE — Progress Notes (Signed)
OFFICE PROGRESS NOTE  CC**  Marisa Linger, MD [address] [facility] [address] 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   PRIOR THERAPY:  #1in January 2014 patient was found to have a suspicious area on screening mammography in the upper outer quadrant of the left breast. She ultimately underwent biopsy of this area which revealed invasive ductal carcinoma. the tumor was estrogen and progesterone receptor negative as well as HER-2/neu negative MRI revealed a solitary mass measuring 1.6 x 1.5 x 1.5 cm in size. Patient was seen in the multidisciplinary breast clinic.   #2patient is status post left lumpectomy with sentinel lymph node biopsies. Her final pathology reveals a 1.4 cm invasive ductal carcinoma grade 3, ER negative PR negative HER-2/neu negative with Ki-67 54%. Sentinel lymph nodes were negative for metastatic disease.   #3 patient is recommended adjuvant chemotherapy consisting of CMFto be given every 3 weeks for a total of 6 cycles adjuvantly.I have recommended CMF since patient does have significant cardiac disease and recently had a myocardial infarction. She also has other neurologic disease as well.  She started CMF on 02/20/13.    CURRENT THERAPY: CMF cycle 6 day 1  INTERVAL HISTORY: Marisa Forbes 62 y.o. female returns for evaluation of her left breast triple negative invasive ductal carcinoma.  She is here for her final cycle of chemotherapy.  She denies fevers, chills, nausea, vomiting, constipation, numbness or any other concerns.  She continues to undergo vesitbular treatment with physical therapy.  Otherwise, a 10 point ROS is negative.   MEDICAL HISTORY: Past Medical History  Diagnosis Date  . HTN (hypertension)   . Diabetes mellitus without complication   . Stroke, embolic   . Myocardial infarction 09/07/12  . Breast cancer  12/03/12    left    ALLERGIES:  has No Known Allergies.  MEDICATIONS:  Current Outpatient Prescriptions  Medication Sig Dispense Refill  . amLODipine (NORVASC) 2.5 MG tablet Take 1 tablet (2.5 mg total) by mouth daily.  90 tablet  3  . aspirin EC 81 MG EC tablet Take 1 tablet (81 mg total) by mouth daily.      Marland Kitchen atorvastatin (LIPITOR) 80 MG tablet TAKE 1 TABLET BY MOUTH DAILY AT 6:00 PM  30 tablet  7  . diazepam (VALIUM) 5 MG tablet Take 1 tablet (5 mg total) by mouth every 6 (six) hours as needed for anxiety.  60 tablet  1  . glucose blood (ONETOUCH VERIO) test strip Use TID  100 each  12  . ibuprofen (ADVIL,MOTRIN) 200 MG tablet Take 200 mg by mouth every 6 (six) hours as needed. For pain      . insulin aspart (NOVOLOG) 100 UNIT/ML injection Inject 10 Units into the skin 3 (three) times daily with meals.  1 vial  11  . insulin glargine (LANTUS SOLOSTAR) 100 UNIT/ML injection Inject 31 Units into the skin at bedtime.  9 mL  12  . isosorbide mononitrate (IMDUR) 60 MG 24 hr tablet TAKE 1 TABLET BY MOUTH DAILY  30 tablet  6  . lidocaine-prilocaine (EMLA) cream Apply topically as needed. Apply to port 1-2 hours before procedure  30 g  1  . lisinopril (PRINIVIL,ZESTRIL) 40 MG tablet Take 40 mg by mouth daily.      . meclizine (ANTIVERT) 12.5 MG tablet Take 1 tablet (12.5 mg total) by  mouth 3 (three) times daily.  21 tablet  0  . metFORMIN (GLUCOPHAGE-XR) 750 MG 24 hr tablet Take 1 tablet (750 mg total) by mouth daily with breakfast.  30 tablet  5  . metoprolol (LOPRESSOR) 50 MG tablet Take 50-75 mg by mouth 2 (two) times daily. TAKE 3 TABLETS IN THE MORNING AND 2 TABLETS IN THE EVENING      . nitroGLYCERIN (NITROSTAT) 0.4 MG SL tablet Place 0.4 mg under the tongue every 5 (five) minutes as needed for chest pain.      Marland Kitchen ondansetron (ZOFRAN) 8 MG tablet Take 1 tablet by mouth 2 times daily starting the day after chemo for 2 days, then take 2 times a day as needed for N/V  30 tablet  1  .  oxyCODONE-acetaminophen (ROXICET) 5-325 MG per tablet Take 1 tablet by mouth every 4 (four) hours as needed for pain.  10 tablet  0  . PRESCRIPTION MEDICATION every 21 ( twenty-one) days. CHEMOTHERAPY [facility]      . Ticagrelor (BRILINTA) 90 MG TABS tablet Take 1 tablet (90 mg total) by mouth 2 (two) times daily.  60 tablet  6   No current facility-administered medications for this visit.    SURGICAL HISTORY:  Past Surgical History  Procedure Laterality Date  . Coronary angioplasty with stent placement    . Breast lumpectomy with needle localization and axillary sentinel lymph node bx Left 01/08/2013    Procedure: LEFT BREAST WIRE GUIDED  LUMPECTOMY AND LEFT AXILLARY SENTINEL  NODE BX;  Surgeon: Emelia Loron, MD;  Location: [facility] OR;  Service: General;  Laterality: Left;  . Back surgery    . Portacath placement Right 02/17/2013    Procedure: INSERTION PORT-A-CATH;  Surgeon: Emelia Loron, MD;  Location: [facility] OR;  Service: General;  Laterality: Right;    REVIEW OF SYSTEMS:  General: fatigue (+), night sweats (-), fever (-), pain (-) Lymph: palpable nodes (-) HEENT: vision changes (-), mucositis (-), gum bleeding (-), epistaxis (-) Cardiovascular: chest pain (-), palpitations (-) Pulmonary: shortness of breath (-), dyspnea on exertion (-), cough (-), hemoptysis (-) GI:  Early satiety (-), melena (-), dysphagia (-), nausea/vomiting (-), diarrhea (-) GU: dysuria (-), hematuria (-), incontinence (-) Musculoskeletal: joint swelling (-), joint pain (-), back pain (-) Neuro: weakness (-), numbness (-), headache (-), confusion (-) Skin: Rash (-), lesions (-), dryness (-) Psych: depression (-), suicidal/homicidal ideation (-), feeling of hopelessness (-)   PHYSICAL EXAMINATION: Blood pressure 145/80, pulse 58, temperature 97.9 F (36.6 C), temperature source Oral, resp. rate 18, height 5\' 5"  (1.651 m), weight 150 lb 12.8 oz (68.402 kg). Body mass index is 25.09 kg/(m^2). General: Patient is a  well appearing female in no acute distress HEENT: PERRLA, sclerae anicteric no conjunctival pallor, MMM Neck: supple, no palpable adenopathy Lungs: clear to auscultation bilaterally, no wheezes, rhonchi, or rales Cardiovascular: regular rate rhythm, S1, S2, no murmurs, rubs or gallops Abdomen: Soft, non-tender, non-distended, normoactive bowel sounds, no HSM Extremities: warm and well perfused, no clubbing, cyanosis, mild right arm swelling Skin: No rashes or lesions, ecchymosis to right deltoid Neuro: Non-focal Breasts: left lumpectomy site healing well, does have minimal swelling to the left breast--which has much improved, also, right breast without masses or nodularity.  Right port ecchymosis has resolved.   ECOG PERFORMANCE STATUS: 0 - Asymptomatic  LABORATORY DATA: Lab Results  Component Value Date   WBC 2.9* 06/05/2013   HGB 13.4 06/05/2013   HCT 39.5 06/05/2013   MCV 88.0 06/05/2013  PLT 196 06/05/2013      Chemistry      Component Value Date/Time   NA 140 05/22/2013 0822   NA 141 05/19/2013 0537   K 4.0 05/22/2013 0822   K 3.4* 05/19/2013 0537   CL 109 05/19/2013 0537   CL 110* 04/10/2013 1020   CO2 26 05/22/2013 0822   CO2 26 05/19/2013 0537   BUN 9.5 05/22/2013 0822   BUN 12 05/19/2013 0537   CREATININE 0.7 05/22/2013 0822   CREATININE 0.45* 05/19/2013 0537      Component Value Date/Time   CALCIUM 9.5 05/22/2013 0822   CALCIUM 9.0 05/19/2013 0537   ALKPHOS 82 05/22/2013 0822   ALKPHOS 67 01/06/2013 2205   AST 15 05/22/2013 0822   AST 16 01/06/2013 2205   ALT 30 05/22/2013 0822   ALT 17 01/06/2013 2205   BILITOT 0.71 05/22/2013 0822   BILITOT 0.2* 01/06/2013 2205       RADIOGRAPHIC STUDIES:  Dg Chest Port 1 View  02/17/2013  *RADIOLOGY REPORT*  Clinical Data: Port placement  PORTABLE CHEST - 1 VIEW  Comparison: 09/11/2012  Findings: Port has been placed on the right, introduced from the subclavian vein.  The tip is in the SVC 4 cm above the right atrium.  No pneumothorax.  Lungs  are clear.  IMPRESSION: Port well positioned with its tip in the SVC 4 cm above the right atrium.  No complication evident.   Original Report Authenticated By: Paulina Fusi, M.D.    Dg Fluoro Guide Cv Line-no Report  02/17/2013  CLINICAL DATA: portacath placement   FLOURO GUIDE CV LINE  Fluoroscopy was utilized by the requesting physician.  No radiographic  interpretation.      ASSESSMENT: 62 year old female with   #1 new diagnosis of invasive ductal carcinoma that is triple negative measuring 1.4 cm node-negative. Patient is status post lumpectomy. Postoperatively she is doing well. Diagnosis code 174.2.  #2 patient and I discussed adjuvant therapy. She would be a candidate for adjuvant chemotherapy consisting of CMF given every 3 weeks for a total of 6 cycles. Risks and benefits of treatment were discussed with her. The dates of her chemotherapy include 4/24, 5/15, 6/5, 6/26, 7/17, 8/7.  She will also have appointments 7 days after her chemotherapy for labs and an MD/NP appointment for evaluation of chemotoxicities. She also occasionally requires Neulasta injections the day following chemotherapy due to mild neutropenia.     PLAN:   1. Ms. Foresta is doing well today.  Her labs are stable. She is slightly neutropenic with anc of 1200.  She will proceed with chemotherapy today.    2.  She will return tomorrow for Neulasta and next week for labs and evaluation.  She will proceed with radiation under the care of Dr. Roselind Messier.    3.She is planning on following up with her cardiologist about her hypertension on 06/09/13.    4. She will continue to go to physical therapy for her dizziness.    All questions were answered. The patient knows to call the clinic with any problems, questions or concerns. We can certainly see the patient much sooner if necessary.  I spent 15 minutes counseling the patient face to face. The total time spent in the appointment was 30 minutes.  Cherie Ouch Lyn Hollingshead,  NP Medical Oncology [facility] Phone: [phone] 06/05/2013, 10:40 AM

## 2013-06-05 NOTE — Progress Notes (Signed)
5FU mixed with Cytoxan for infusion over 30 min.

## 2013-06-05 NOTE — Progress Notes (Signed)
Treat despite counts today per Lillia Abed, NP.

## 2013-06-05 NOTE — Patient Instructions (Signed)
Doing well.  Proceed with chemotherapy.  Return tomorrow for Neulasta.  Please call us if you have any questions or concerns.

## 2013-06-06 ENCOUNTER — Ambulatory Visit (HOSPITAL_BASED_OUTPATIENT_CLINIC_OR_DEPARTMENT_OTHER): Payer: BC Managed Care – PPO

## 2013-06-06 VITALS — BP 175/93 | HR 68 | Temp 98.3°F | Resp 16

## 2013-06-06 DIAGNOSIS — Z5189 Encounter for other specified aftercare: Secondary | ICD-10-CM

## 2013-06-06 DIAGNOSIS — C50212 Malignant neoplasm of upper-inner quadrant of left female breast: Secondary | ICD-10-CM

## 2013-06-06 DIAGNOSIS — C50919 Malignant neoplasm of unspecified site of unspecified female breast: Secondary | ICD-10-CM

## 2013-06-06 MED ORDER — PEGFILGRASTIM INJECTION 6 MG/0.6ML
6.0000 mg | Freq: Once | SUBCUTANEOUS | Status: AC
  Administered 2013-06-06: 6 mg via SUBCUTANEOUS
  Filled 2013-06-06: qty 0.6

## 2013-06-06 NOTE — Progress Notes (Signed)
Seeing cardiologist for BP control.

## 2013-06-06 NOTE — Addendum Note (Signed)
Encounter addended by: Bernell List on: 06/06/2013 12:25 PM<BR>     Documentation filed: Charges VN

## 2013-06-09 ENCOUNTER — Ambulatory Visit: Payer: BC Managed Care – PPO | Admitting: Internal Medicine

## 2013-06-09 ENCOUNTER — Telehealth (HOSPITAL_COMMUNITY): Payer: Self-pay | Admitting: Cardiovascular Disease

## 2013-06-09 ENCOUNTER — Encounter: Payer: BC Managed Care – PPO | Admitting: Physical Therapy

## 2013-06-09 NOTE — Telephone Encounter (Signed)
Looked under the "order review tab" there was not a Stress test there for me to cancel.

## 2013-06-09 NOTE — Telephone Encounter (Signed)
  [facility] AT [facility] [address] [place], [place] [address] [phone]   Date:06/09/2013  Dear Dr. Tresa Endo  Our office has attempted to contact your patient Marisa Forbes (04-12-1951)  twice by telephone and we have also sent an appointment letter to schedule the Nuclear Stress test you ordered. The patient has not responded. We will not make any further attempts to contact the patient. If any further assistance is needed for this referral, please contact our office at [phone]  Sincerely, [facility]ovascular [facility] Scheduling Team

## 2013-06-11 ENCOUNTER — Encounter: Payer: Self-pay | Admitting: *Deleted

## 2013-06-11 ENCOUNTER — Encounter: Payer: BC Managed Care – PPO | Admitting: Physical Therapy

## 2013-06-11 NOTE — Progress Notes (Signed)
[facility] Psychosocial Distress Screening Clinical Social Work  Clinical Social Work was referred by distress screening protocol.  The patient scored a 5 on the Psychosocial Distress Thermometer which indicates moderate distress. Clinical Social Worker called patient to assess for distress and other psychosocial needs. CSW unable to reach patient, requested patient return call when convenient.   Clinical Social Worker follow up needed: yes  If yes, follow up plan: csw waiting patient call back   Kathrin Penner, MSW, LCSW Clinical Social Worker [facility] [phone]

## 2013-06-12 ENCOUNTER — Encounter: Payer: Self-pay | Admitting: Adult Health

## 2013-06-12 ENCOUNTER — Ambulatory Visit
Admission: RE | Admit: 2013-06-12 | Discharge: 2013-06-12 | Disposition: A | Discharge status: Home / Self Care | Payer: BC Managed Care – PPO | Source: Ambulatory Visit | Attending: Radiation Oncology | Admitting: Radiation Oncology

## 2013-06-12 ENCOUNTER — Ambulatory Visit (HOSPITAL_BASED_OUTPATIENT_CLINIC_OR_DEPARTMENT_OTHER): Payer: BC Managed Care – PPO | Admitting: Adult Health

## 2013-06-12 ENCOUNTER — Ambulatory Visit: Payer: BC Managed Care – PPO | Admitting: Physical Therapy

## 2013-06-12 ENCOUNTER — Other Ambulatory Visit (HOSPITAL_BASED_OUTPATIENT_CLINIC_OR_DEPARTMENT_OTHER): Payer: BC Managed Care – PPO | Admitting: Lab

## 2013-06-12 ENCOUNTER — Telehealth: Payer: Self-pay | Admitting: *Deleted

## 2013-06-12 VITALS — BP 138/78 | HR 60 | Temp 97.7°F | Resp 20 | Ht 65.0 in | Wt 149.7 lb

## 2013-06-12 DIAGNOSIS — C50212 Malignant neoplasm of upper-inner quadrant of left female breast: Secondary | ICD-10-CM

## 2013-06-12 DIAGNOSIS — C50219 Malignant neoplasm of upper-inner quadrant of unspecified female breast: Secondary | ICD-10-CM | POA: Insufficient documentation

## 2013-06-12 DIAGNOSIS — R42 Dizziness and giddiness: Secondary | ICD-10-CM | POA: Insufficient documentation

## 2013-06-12 DIAGNOSIS — C50912 Malignant neoplasm of unspecified site of left female breast: Secondary | ICD-10-CM

## 2013-06-12 DIAGNOSIS — Z510 Encounter for antineoplastic radiation therapy: Secondary | ICD-10-CM | POA: Insufficient documentation

## 2013-06-12 DIAGNOSIS — Z79899 Other long term (current) drug therapy: Secondary | ICD-10-CM | POA: Insufficient documentation

## 2013-06-12 LAB — COMPREHENSIVE METABOLIC PANEL (CC13)
ALT: 14 U/L (ref 0–55)
AST: 9 U/L (ref 5–34)
Albumin: 3.6 g/dL (ref 3.5–5.0)
Alkaline Phosphatase: 125 U/L (ref 40–150)
BUN: 9.2 mg/dL (ref 7.0–26.0)
CO2: 24 mEq/L (ref 22–29)
Calcium: 9.5 mg/dL (ref 8.4–10.4)
Chloride: 110 mEq/L — ABNORMAL HIGH (ref 98–109)
Creatinine: 0.6 mg/dL (ref 0.6–1.1)
Glucose: 95 mg/dl (ref 70–140)
Potassium: 3.9 mEq/L (ref 3.5–5.1)
Sodium: 144 mEq/L (ref 136–145)
Total Bilirubin: 0.73 mg/dL (ref 0.20–1.20)
Total Protein: 6.3 g/dL — ABNORMAL LOW (ref 6.4–8.3)

## 2013-06-12 LAB — CBC WITH DIFFERENTIAL/PLATELET
BASO%: 0.3 % (ref 0.0–2.0)
Basophils Absolute: 0 10*3/uL (ref 0.0–0.1)
EOS%: 3.1 % (ref 0.0–7.0)
Eosinophils Absolute: 0.2 10*3/uL (ref 0.0–0.5)
HCT: 38.6 % (ref 34.8–46.6)
HGB: 13.1 g/dL (ref 11.6–15.9)
LYMPH%: 10.9 % — ABNORMAL LOW (ref 14.0–49.7)
MCH: 30.3 pg (ref 25.1–34.0)
MCHC: 34 g/dL (ref 31.5–36.0)
MCV: 89 fL (ref 79.5–101.0)
MONO#: 0.3 10*3/uL (ref 0.1–0.9)
MONO%: 4.3 % (ref 0.0–14.0)
NEUT#: 5.2 10*3/uL (ref 1.5–6.5)
NEUT%: 81.4 % — ABNORMAL HIGH (ref 38.4–76.8)
Platelets: 114 10*3/uL — ABNORMAL LOW (ref 145–400)
RBC: 4.33 10*6/uL (ref 3.70–5.45)
RDW: 15 % — ABNORMAL HIGH (ref 11.2–14.5)
WBC: 6.4 10*3/uL (ref 3.9–10.3)
lymph#: 0.7 10*3/uL — ABNORMAL LOW (ref 0.9–3.3)

## 2013-06-12 NOTE — Telephone Encounter (Signed)
appts made and printed...td 

## 2013-06-12 NOTE — Progress Notes (Signed)
OFFICE PROGRESS NOTE  CC**  [name], MD [address] [facility] [address] 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   PRIOR THERAPY:  #1in January 2014 patient was found to have a suspicious area on screening mammography in the upper outer quadrant of the left breast. She ultimately underwent biopsy of this area which revealed invasive ductal carcinoma. the tumor was estrogen and progesterone receptor negative as well as HER-2/neu negative MRI revealed a solitary mass measuring 1.6 x 1.5 x 1.5 cm in size. Patient was seen in the multidisciplinary breast clinic.   #2patient is status post left lumpectomy with sentinel lymph node biopsies. Her final pathology reveals a 1.4 cm invasive ductal carcinoma grade 3, ER negative PR negative HER-2/neu negative with Ki-67 54%. Sentinel lymph nodes were negative for metastatic disease.   #3 patient is recommended adjuvant chemotherapy consisting of CMFto be given every 3 weeks for a total of 6 cycles adjuvantly.I have recommended CMF since patient does have significant cardiac disease and recently had a myocardial infarction. She also has other neurologic disease as well.  She started CMF on 02/20/13. She underwent 6 cycles of this and completed therapy on 06/05/13.    CURRENT THERAPY: CMF cycle 6 day 8, radiation therapy  INTERVAL HISTORY: Marisa Forbes 62 y.o. female returns for evaluation of her left breast triple negative invasive ductal carcinoma.  She is here following her final cycle of chemotherapy.  She denies fevers, chills, nausea, vomiting, constipation, numbness or any other concerns.  She continues to undergo vesitbular treatment with physical therapy.  She is scheduled for CT simulation for radiation therapy today.  She would like her port removed by Dr. Dwain Sarna.  Otherwise, a 10 point ROS is  negative.   MEDICAL HISTORY: Past Medical History  Diagnosis Date  . HTN (hypertension)   . Diabetes mellitus without complication   . Stroke, embolic   . Myocardial infarction 09/07/12  . Breast cancer 12/03/12    left    ALLERGIES:  has No Known Allergies.  MEDICATIONS:  Current Outpatient Prescriptions  Medication Sig Dispense Refill  . amLODipine (NORVASC) 2.5 MG tablet Take 1 tablet (2.5 mg total) by mouth daily.  90 tablet  3  . aspirin EC 81 MG EC tablet Take 1 tablet (81 mg total) by mouth daily.      Marland Kitchen atorvastatin (LIPITOR) 80 MG tablet TAKE 1 TABLET BY MOUTH DAILY AT 6:00 PM  30 tablet  7  . diazepam (VALIUM) 5 MG tablet Take 1 tablet (5 mg total) by mouth every 6 (six) hours as needed for anxiety.  60 tablet  1  . glucose blood (ONETOUCH VERIO) test strip Use TID  100 each  12  . ibuprofen (ADVIL,MOTRIN) 200 MG tablet Take 200 mg by mouth every 6 (six) hours as needed. For pain      . insulin aspart (NOVOLOG) 100 UNIT/ML injection Inject 10 Units into the skin 3 (three) times daily with meals.  1 vial  11  . insulin glargine (LANTUS SOLOSTAR) 100 UNIT/ML injection Inject 31 Units into the skin at bedtime.  9 mL  12  . isosorbide mononitrate (IMDUR) 60 MG 24 hr tablet TAKE 1 TABLET BY MOUTH DAILY  30 tablet  6  . lidocaine-prilocaine (EMLA) cream Apply topically as needed. Apply to port 1-2 hours before procedure  30  g  1  . lisinopril (PRINIVIL,ZESTRIL) 40 MG tablet Take 40 mg by mouth daily.      . meclizine (ANTIVERT) 12.5 MG tablet Take 1 tablet (12.5 mg total) by mouth 3 (three) times daily.  21 tablet  0  . metFORMIN (GLUCOPHAGE-XR) 750 MG 24 hr tablet Take 1 tablet (750 mg total) by mouth daily with breakfast.  30 tablet  5  . metoprolol (LOPRESSOR) 50 MG tablet Take 50-75 mg by mouth 2 (two) times daily. TAKE 3 TABLETS IN THE MORNING AND 2 TABLETS IN THE EVENING      . nitroGLYCERIN (NITROSTAT) 0.4 MG SL tablet Place 0.4 mg under the tongue every 5 (five) minutes as  needed for chest pain.      Marland Kitchen ondansetron (ZOFRAN) 8 MG tablet Take 1 tablet by mouth 2 times daily starting the day after chemo for 2 days, then take 2 times a day as needed for N/V  30 tablet  1  . oxyCODONE-acetaminophen (ROXICET) 5-325 MG per tablet Take 1 tablet by mouth every 4 (four) hours as needed for pain.  10 tablet  0  . PRESCRIPTION MEDICATION every 21 ( twenty-one) days. CHEMOTHERAPY [facility]      . Ticagrelor (BRILINTA) 90 MG TABS tablet Take 1 tablet (90 mg total) by mouth 2 (two) times daily.  60 tablet  6   No current facility-administered medications for this visit.    SURGICAL HISTORY:  Past Surgical History  Procedure Laterality Date  . Coronary angioplasty with stent placement    . Breast lumpectomy with needle localization and axillary sentinel lymph node bx Left 01/08/2013    Procedure: LEFT BREAST WIRE GUIDED  LUMPECTOMY AND LEFT AXILLARY SENTINEL  NODE BX;  Surgeon: Emelia Loron, MD;  Location: [facility] OR;  Service: General;  Laterality: Left;  . Back surgery    . Portacath placement Right 02/17/2013    Procedure: INSERTION PORT-A-CATH;  Surgeon: Emelia Loron, MD;  Location: [facility] OR;  Service: General;  Laterality: Right;    REVIEW OF SYSTEMS:  General: fatigue (+), night sweats (-), fever (-), pain (-) Lymph: palpable nodes (-) HEENT: vision changes (-), mucositis (-), gum bleeding (-), epistaxis (-) Cardiovascular: chest pain (-), palpitations (-) Pulmonary: shortness of breath (-), dyspnea on exertion (-), cough (-), hemoptysis (-) GI:  Early satiety (-), melena (-), dysphagia (-), nausea/vomiting (-), diarrhea (-) GU: dysuria (-), hematuria (-), incontinence (-) Musculoskeletal: joint swelling (-), joint pain (-), back pain (-) Neuro: weakness (-), numbness (-), headache (-), confusion (-) Skin: Rash (-), lesions (-), dryness (-) Psych: depression (-), suicidal/homicidal ideation (-), feeling of hopelessness (-)   PHYSICAL EXAMINATION: Blood pressure  138/78, pulse 60, temperature 97.7 F (36.5 C), temperature source Oral, resp. rate 20, height 5\' 5"  (1.651 m), weight 149 lb 11.2 oz (67.903 kg). Body mass index is 24.91 kg/(m^2). General: Patient is a well appearing female in no acute distress HEENT: PERRLA, sclerae anicteric no conjunctival pallor, MMM Neck: supple, no palpable adenopathy Lungs: clear to auscultation bilaterally, no wheezes, rhonchi, or rales Cardiovascular: regular rate rhythm, S1, S2, no murmurs, rubs or gallops Abdomen: Soft, non-tender, non-distended, normoactive bowel sounds, no HSM Extremities: warm and well perfused, no clubbing, cyanosis, mild right arm swelling Skin: No rashes or lesions, ecchymosis to right deltoid Neuro: Non-focal Breasts: left lumpectomy site healing well, does have minimal swelling to the left breast--which has much improved, also, right breast without masses or nodularity.  Right port ecchymosis has resolved.   ECOG  PERFORMANCE STATUS: 0 - Asymptomatic  LABORATORY DATA: Lab Results  Component Value Date   WBC 6.4 06/12/2013   HGB 13.1 06/12/2013   HCT 38.6 06/12/2013   MCV 89.0 06/12/2013   PLT 114* 06/12/2013      Chemistry      Component Value Date/Time   NA 141 06/05/2013 1016   NA 141 05/19/2013 0537   K 4.1 06/05/2013 1016   K 3.4* 05/19/2013 0537   CL 109 05/19/2013 0537   CL 110* 04/10/2013 1020   CO2 25 06/05/2013 1016   CO2 26 05/19/2013 0537   BUN 13.1 06/05/2013 1016   BUN 12 05/19/2013 0537   CREATININE 0.7 06/05/2013 1016   CREATININE 0.45* 05/19/2013 0537      Component Value Date/Time   CALCIUM 9.1 06/05/2013 1016   CALCIUM 9.0 05/19/2013 0537   ALKPHOS 88 06/05/2013 1016   ALKPHOS 67 01/06/2013 2205   AST 18 06/05/2013 1016   AST 16 01/06/2013 2205   ALT 31 06/05/2013 1016   ALT 17 01/06/2013 2205   BILITOT 0.62 06/05/2013 1016   BILITOT 0.2* 01/06/2013 2205       RADIOGRAPHIC STUDIES:  Dg Chest Port 1 View  02/17/2013  *RADIOLOGY REPORT*  Clinical Data: Port placement  PORTABLE  CHEST - 1 VIEW  Comparison: 09/11/2012  Findings: Port has been placed on the right, introduced from the subclavian vein.  The tip is in the SVC 4 cm above the right atrium.  No pneumothorax.  Lungs are clear.  IMPRESSION: Port well positioned with its tip in the SVC 4 cm above the right atrium.  No complication evident.   Original Report Authenticated By: Paulina Fusi, M.D.    Dg Fluoro Guide Cv Line-no Report  02/17/2013  CLINICAL DATA: portacath placement   FLOURO GUIDE CV LINE  Fluoroscopy was utilized by the requesting physician.  No radiographic  interpretation.      ASSESSMENT: 62 year old female with   #1 new diagnosis of invasive ductal carcinoma that is triple negative measuring 1.4 cm node-negative. Patient is status post lumpectomy. Postoperatively she is doing well. Diagnosis code 174.2.  #2 patient and I discussed adjuvant therapy. She would be a candidate for adjuvant chemotherapy consisting of CMF given every 3 weeks for a total of 6 cycles. Risks and benefits of treatment were discussed with her. The dates of her chemotherapy include 4/24, 5/15, 6/5, 6/26, 7/17, 8/7.  She will also have appointments 7 days after her chemotherapy for labs and an MD/NP appointment for evaluation of chemotoxicities. She also occasionally requires Neulasta injections the day following chemotherapy due to mild neutropenia.  She completed CMF chemotherapy on 06/05/13 and will proceed with radiation therapy.     PLAN:   1. Marisa Forbes is doing well today.  Her labs are stable. She has completed chemotherapy.  She will proceed with radiation under the care of Dr. Roselind Messier and will go to CT simulation today.    2. She will continue to go to physical therapy for her dizziness.    3.  I referred her back to Dr. Dwain Sarna for port removal.    4. She will return in 2 months after radiation therapy.   All questions were answered. The patient knows to call the clinic with any problems, questions or concerns. We  can certainly see the patient much sooner if necessary.  I spent 25 minutes counseling the patient face to face. The total time spent in the appointment was 30 minutes.  Cherie Ouch Lyn Hollingshead, NP Medical Oncology [facility] Phone: [phone] 06/12/2013, 9:03 AM

## 2013-06-12 NOTE — Patient Instructions (Addendum)
Doing well.  Proceed with radiation therapy under Dr. Roselind Messier.  Proceed with port removal.  We will see you back in 2 months.  Please call us if you have any questions or concerns.

## 2013-06-16 ENCOUNTER — Other Ambulatory Visit: Payer: Self-pay | Admitting: Certified Registered Nurse Anesthetist

## 2013-06-16 ENCOUNTER — Encounter (INDEPENDENT_AMBULATORY_CARE_PROVIDER_SITE_OTHER): Payer: BC Managed Care – PPO | Admitting: General Surgery

## 2013-06-17 ENCOUNTER — Ambulatory Visit: Payer: BC Managed Care – PPO | Admitting: Physical Therapy

## 2013-06-17 NOTE — Progress Notes (Signed)
  Radiation Oncology         [phone] ________________________________  Name: Marisa Forbes MRN: [id number]  Date: 06/12/2013  DOB: 1951-10-20  SIMULATION AND TREATMENT PLANNING NOTE  DIAGNOSIS:  Stage I high-grade triple negative invasive ductal carcinoma of the left breast   NARRATIVE:  The patient was brought to the CT Simulation planning suite.  Identity was confirmed.  All relevant records and images related to the planned course of therapy were reviewed.  The patient freely provided informed written consent to proceed with treatment after reviewing the details related to the planned course of therapy. The consent form was witnessed and verified by the simulation staff.  Then, the patient was set-up in a stable reproducible  supine position for radiation therapy.  CT images were obtained.  Surface markings were placed.  The CT images were loaded into the planning software.  Then the target and avoidance structures were contoured.  Treatment planning then occurred.  The radiation prescription was entered and confirmed.  Then, I designed and supervised the construction of a total of 3 medically necessary complex treatment devices.  I have requested : 3D Simulation  I have requested a DVH of the following structures: heart, lungs, Lumpectomy cavity.  I have ordered:dose calc.  PLAN:  The patient will receive 50.4 Gy in 28 fractions followed by a boost to the lumpectomy cavity to 62.4 Gy.  ________________________________  -----------------------------------  Billie Lade, PhD, MD

## 2013-06-17 NOTE — Progress Notes (Signed)
  Radiation Oncology         [phone] ________________________________  Name: Marisa Forbes MRN: [id number]  Date: 06/12/2013  DOB: 05/18/1951  RESPIRATORY MOTION MANAGEMENT SIMULATION  NARRATIVE:  In order to account for effect of respiratory motion on target structures and other organs in the planning and delivery of radiotherapy, this patient underwent respiratory motion management simulation.  To accomplish this, when the patient was brought to the CT simulation planning suite, 4D respiratoy motion management CT images were obtained.  The CT images were loaded into the planning software.  Then, using a variety of tools including Cine, MIP, and standard views, the target volume and planning target volumes (PTV) were delineated.  Avoidance structures were contoured.  Treatment planning then occurred.  Dose volume histograms were generated and reviewed for each of the requested structure.  The resulting plan was carefully reviewed and approved today.          -----------------------------------  Billie Lade, PhD, MD

## 2013-06-19 ENCOUNTER — Ambulatory Visit
Admission: RE | Admit: 2013-06-19 | Discharge: 2013-06-19 | Disposition: A | Discharge status: Home / Self Care | Payer: BC Managed Care – PPO | Source: Ambulatory Visit | Attending: Radiation Oncology | Admitting: Radiation Oncology

## 2013-06-19 DIAGNOSIS — C50919 Malignant neoplasm of unspecified site of unspecified female breast: Secondary | ICD-10-CM

## 2013-06-19 NOTE — Progress Notes (Signed)
  Radiation Oncology         [phone] ________________________________  Name: SHAMBHAVI SALLEY MRN: [id number]  Date: 06/19/2013  DOB: May 24, 1951  Simulation Verification Note  Status: outpatient  NARRATIVE: The patient was brought to the treatment unit and placed in the planned treatment position. The clinical setup was verified. Then port films were obtained and uploaded to the radiation oncology medical record software.  The treatment beams were carefully compared against the planned radiation fields. The position location and shape of the radiation fields was reviewed. They targeted volume of tissue appears to be appropriately covered by the radiation beams. Organs at risk appear to be excluded as planned.  Based on my personal review, I approved the simulation verification. The patient's treatment will proceed as planned.  -----------------------------------  Billie Lade, PhD, MD

## 2013-06-20 ENCOUNTER — Ambulatory Visit (INDEPENDENT_AMBULATORY_CARE_PROVIDER_SITE_OTHER): Payer: BC Managed Care – PPO | Admitting: Internal Medicine

## 2013-06-20 ENCOUNTER — Encounter: Payer: Self-pay | Admitting: Internal Medicine

## 2013-06-20 ENCOUNTER — Other Ambulatory Visit: Payer: Self-pay | Admitting: Cardiovascular Disease

## 2013-06-20 VITALS — BP 143/83 | HR 61 | Ht 65.0 in | Wt 146.1 lb

## 2013-06-20 DIAGNOSIS — E785 Hyperlipidemia, unspecified: Secondary | ICD-10-CM

## 2013-06-20 DIAGNOSIS — I25810 Atherosclerosis of coronary artery bypass graft(s) without angina pectoris: Secondary | ICD-10-CM

## 2013-06-20 DIAGNOSIS — I10 Essential (primary) hypertension: Secondary | ICD-10-CM

## 2013-06-20 NOTE — Telephone Encounter (Signed)
Patient is now seen at [facility] by Dietrich Pates, MD. I gave the patient #15 w/ 0 RF. Additional refills need to come from Dr. Neomia Dear.

## 2013-06-20 NOTE — Patient Instructions (Addendum)
Your physician recommends that you continue on your current medications as directed. Please refer to the Current Medication list given to you today.  Your physician wants you to follow-up in: February. You will receive a reminder letter in the mail two months in advance. If you don't receive a letter, please call our office to schedule the follow-up appointment.

## 2013-06-20 NOTE — Progress Notes (Signed)
HPI Patient is a 62 yo who I saw in May  She was previously followed by [facility]  She suffered NSTEMI in November 2013  Cath showed .LM: nl  LAD: 100% occlusion after Dx1 and Septal1 with Timi 0 flow; 80% ostial and Mid Dx; once LAD opened diffuse 80 - 90% mid LAD stenosis in a small vessel  LCX: nl  RCA: 50% mid, 80% distal before PDA takeoff  PTCA/Stent LAD at occluded site with 2.25 x 15 Xience DES stent to 2.30 mm;  PTCA to diffusey diseased mid LAD with long infations up to 2.14 maximally, not stented with TIMI 3 flow returned.  EF: 45 - 50% with mild anterolateral hypokinesis.  20% L renal narrowing  Post procedure the patient hand recurrent pain Went back for second cardiac cath This showed: Stable post PCI anatomy with diffuse distal LAD disease & a suggestion of small dissection distal to tiny D2. Patent LAD stent with TIMI 3 flow in all vessels. Stable D2 lesions - in a 1.5-~2.0 mm vessel, unlikely cause of resting angina -- would defer to Dr. Tresa Endo, but not very good PCI targets. Stable RCA lesion - non-flow limiting.  The patient was discharged home on Ranexa She has never filled due to cost.   Since the spring the patient has undergone chemoRx for breast CA  (CMF)  She is now about to start radiation therapy.   SHe denies CP No RUQ pain. Breathing is stable  She did have one syncopal spell since seen  The patient says she was sitting on a motorcycle  Became warm, nauseated then passed out  No other symptoms.   No Known Allergies  Current Outpatient Prescriptions  Medication Sig Dispense Refill  . amLODipine (NORVASC) 2.5 MG tablet Take 1 tablet (2.5 mg total) by mouth daily.  90 tablet  3  . aspirin EC 81 MG EC tablet Take 1 tablet (81 mg total) by mouth daily.      Marland Kitchen atorvastatin (LIPITOR) 80 MG tablet TAKE 1 TABLET BY MOUTH DAILY AT 6:00 PM  30 tablet  7  . diazepam (VALIUM) 5 MG tablet Take 1 tablet (5 mg total) by mouth every 6 (six) hours as needed for anxiety.  60 tablet  1   . glucose blood (ONETOUCH VERIO) test strip Use TID  100 each  12  . ibuprofen (ADVIL,MOTRIN) 200 MG tablet Take 200 mg by mouth every 6 (six) hours as needed. For pain      . insulin aspart (NOVOLOG) 100 UNIT/ML injection Inject 10 Units into the skin 3 (three) times daily with meals.  1 vial  11  . insulin glargine (LANTUS SOLOSTAR) 100 UNIT/ML injection Inject 31 Units into the skin at bedtime.  9 mL  12  . isosorbide mononitrate (IMDUR) 60 MG 24 hr tablet TAKE 1 TABLET BY MOUTH DAILY  30 tablet  6  . lisinopril (PRINIVIL,ZESTRIL) 40 MG tablet Take 40 mg by mouth daily.      . metFORMIN (GLUCOPHAGE-XR) 750 MG 24 hr tablet Take 1 tablet (750 mg total) by mouth daily with breakfast.  30 tablet  5  . metoprolol (LOPRESSOR) 50 MG tablet Take 50-75 mg by mouth 2 (two) times daily. TAKE 3 TABLETS IN THE MORNING AND 2 TABLETS IN THE EVENING      . nitroGLYCERIN (NITROSTAT) 0.4 MG SL tablet Place 0.4 mg under the tongue every 5 (five) minutes as needed for chest pain.      . Ticagrelor (BRILINTA)  90 MG TABS tablet Take 1 tablet (90 mg total) by mouth 2 (two) times daily.  60 tablet  6   No current facility-administered medications for this visit.    Past Medical History  Diagnosis Date  . HTN (hypertension)   . Diabetes mellitus without complication   . Stroke, embolic   . Myocardial infarction 09/07/12  . Breast cancer 12/03/12    left    Past Surgical History  Procedure Laterality Date  . Coronary angioplasty with stent placement    . Breast lumpectomy with needle localization and axillary sentinel lymph node bx Left 01/08/2013    Procedure: LEFT BREAST WIRE GUIDED  LUMPECTOMY AND LEFT AXILLARY SENTINEL  NODE BX;  Surgeon: Emelia Loron, MD;  Location: [facility] OR;  Service: General;  Laterality: Left;  . Back surgery    . Portacath placement Right 02/17/2013    Procedure: INSERTION PORT-A-CATH;  Surgeon: Emelia Loron, MD;  Location: [facility] OR;  Service: General;  Laterality: Right;     Family History  Problem Relation Age of Onset  . Cancer Neg Hx   . Diabetes Neg Hx   . Hyperlipidemia Neg Hx   . Hypertension Neg Hx   . Kidney disease Neg Hx   . Stroke Neg Hx     History   Social History  . Marital Status: Married    Spouse Name: N/A    Number of Children: N/A  . Years of Education: N/A   Occupational History  . Not on file.   Social History Main Topics  . Smoking status: Never Smoker   . Smokeless tobacco: Never Used  . Alcohol Use: No     Comment: none  . Drug Use: No  . Sexual Activity: Yes   Other Topics Concern  . Not on file   Social History Narrative  . No narrative on file    Review of Systems:  All systems reviewed.  They are negative to the above problem except as previously stated.  Vital Signs: BP 143/83  Pulse 61  Ht 5\' 5"  (1.651 m)  Wt 146 lb 1.9 oz (66.28 kg)  BMI 24.32 kg/m2  SpO2 98%  Physical Exam Patient is in NAD HEENT:  Normocephalic, atraumatic. EOMI, PERRLA.  Neck: JVP is normal.  No bruits.  Lungs: clear to auscultation. No rales no wheezes.  Heart: Regular rate and rhythm. Normal S1, S2. No S3.   No significant murmurs. PMI not displaced.  Abdomen:  Supple, nontender. Normal bowel sounds. No masses. No hepatomegaly.  Extremities:   Good distal pulses throughout. No lower extremity edema.  Musculoskeletal :moving all extremities.  Neuro:   alert and oriented x3.  CN II-XII grossly intact.   Assessment and Plan:  1.  CAD  Doing fairly well  Keep on same regimen  Will check on dual antiplatelet regimen  2.  Breast CA  To begin XRT  3.  HTN  Adequate control  4.  HL  Keep on statin.

## 2013-06-23 ENCOUNTER — Encounter: Payer: Self-pay | Admitting: Internal Medicine

## 2013-06-23 ENCOUNTER — Ambulatory Visit (INDEPENDENT_AMBULATORY_CARE_PROVIDER_SITE_OTHER): Payer: BC Managed Care – PPO | Admitting: Internal Medicine

## 2013-06-23 ENCOUNTER — Ambulatory Visit
Admission: RE | Admit: 2013-06-23 | Discharge: 2013-06-23 | Disposition: A | Discharge status: Home / Self Care | Payer: BC Managed Care – PPO | Source: Ambulatory Visit | Attending: Radiation Oncology | Admitting: Radiation Oncology

## 2013-06-23 VITALS — BP 152/82 | HR 62 | Temp 97.7°F | Resp 16 | Wt 150.0 lb

## 2013-06-23 DIAGNOSIS — R270 Ataxia, unspecified: Secondary | ICD-10-CM

## 2013-06-23 DIAGNOSIS — I639 Cerebral infarction, unspecified: Secondary | ICD-10-CM

## 2013-06-23 DIAGNOSIS — E119 Type 2 diabetes mellitus without complications: Secondary | ICD-10-CM

## 2013-06-23 DIAGNOSIS — R279 Unspecified lack of coordination: Secondary | ICD-10-CM

## 2013-06-23 DIAGNOSIS — F4321 Adjustment disorder with depressed mood: Secondary | ICD-10-CM

## 2013-06-23 DIAGNOSIS — E785 Hyperlipidemia, unspecified: Secondary | ICD-10-CM

## 2013-06-23 DIAGNOSIS — H8110 Benign paroxysmal vertigo, unspecified ear: Secondary | ICD-10-CM

## 2013-06-23 DIAGNOSIS — I10 Essential (primary) hypertension: Secondary | ICD-10-CM

## 2013-06-23 MED ORDER — DIAZEPAM 5 MG PO TABS: 5.0000 mg | ORAL_TABLET | Freq: Four times a day (QID) | ORAL | Status: DC | PRN

## 2013-06-23 NOTE — Patient Instructions (Signed)
Ataxia You have an unsteady walk called ataxia. Your condition may require further tests. Ataxia can be caused by:  Neurological conditions.  Infections.  Physical exhaustion.  Internal bleeding.  Alcohol intoxication, or medication side effects.  Problems with circulation, blood pressure, and heart disease can also make you unsteady. Laboratory and X-ray tests may be needed.  Treatment for now:  Get plenty of rest and eat a nutritious diet over the next weeks.  Avoid alcohol.  If you become very unsteady, dizzy, nauseated, or feel like you are going to faint, lie down flat right away.  Wait until all your symptoms pass before you get up again. SEEK IMMEDIATE MEDICAL CARE IF:  You develop severe unsteadiness, headache, chest pain, or abdominal pain.  You have weakness or numbness on one side of your body.  You have problems with your vision.  You develop confusion or difficulty speaking.  You have a fever, chills, or an irregular heartbeat or a very fast pulse. MAKE SURE YOU:   Understand these instructions.  Will watch your condition.  Will get help right away if you are not doing well or get worse. Document Released: 10/16/2005 Document Revised: 01/08/2012 Document Reviewed: 04/04/2007 [employer] Patient Information 2014 [employer].  

## 2013-06-23 NOTE — Assessment & Plan Note (Signed)
Her BP is adequately well controlled I will check her lytes and renal function today

## 2013-06-23 NOTE — Assessment & Plan Note (Signed)
This appears to have improved She will continue valium and PT I have ordered an MRI to see if she has a tumor

## 2013-06-23 NOTE — Assessment & Plan Note (Signed)
FLP today 

## 2013-06-23 NOTE — Assessment & Plan Note (Signed)
She is due for her A1C check I will address if needed

## 2013-06-23 NOTE — Assessment & Plan Note (Signed)
She has worsening s/s so I have ordered an MRI

## 2013-06-23 NOTE — Progress Notes (Signed)
  Subjective:    Patient ID: Marisa Forbes, female    DOB: 06/01/1951, 62 y.o.   MRN: [id number]  HPI  She returns c/o a one month history of worsening ataxia with dizziness and lightheadedness. When sx's started one month ago she had vertigo, CT scan was done and was unremarkable. She has tried anti-vert (no help) and valium/PT (lots of help wrt the vertigo but not the ataxia).  Review of Systems  Constitutional: Negative.   HENT: Negative.  Negative for sore throat, drooling, trouble swallowing and voice change.   Eyes: Negative.   Respiratory: Negative.  Negative for cough, chest tightness, shortness of breath, wheezing and stridor.   Cardiovascular: Negative.  Negative for chest pain, palpitations and leg swelling.  Gastrointestinal: Negative.  Negative for nausea, vomiting, abdominal pain, diarrhea and constipation.  Endocrine: Negative.  Negative for polydipsia, polyphagia and polyuria.  Genitourinary: Negative.   Musculoskeletal: Positive for gait problem. Negative for myalgias, back pain and joint swelling.       She feels off balance, she has to brace against the wall to stand up.  Skin: Negative.   Allergic/Immunologic: Negative.   Neurological: Positive for dizziness, tremors (head tremor only) and light-headedness. Negative for seizures, syncope, facial asymmetry, speech difficulty, weakness, numbness and headaches.  Hematological: Negative.  Negative for adenopathy. Does not bruise/bleed easily.  Psychiatric/Behavioral: Negative for suicidal ideas, hallucinations, behavioral problems, confusion, sleep disturbance, decreased concentration and agitation. The patient is nervous/anxious. The patient is not hyperactive.        Objective:   Physical Exam   Lab Results  Component Value Date   WBC 6.4 06/12/2013   HGB 13.1 06/12/2013   HCT 38.6 06/12/2013   PLT 114* 06/12/2013   GLUCOSE 95 06/12/2013   CHOL 216* 09/08/2012   TRIG 68 09/08/2012   HDL 60 09/08/2012   LDLCALC  142* 09/08/2012   ALT 14 06/12/2013   AST 9 06/12/2013   NA 144 06/12/2013   K 3.9 06/12/2013   CL 109 05/19/2013   CREATININE 0.6 06/12/2013   BUN 9.2 06/12/2013   CO2 24 06/12/2013   TSH 1.315 01/06/2013   INR 0.98 02/10/2013   HGBA1C 13.6* 09/07/2012       Assessment & Plan:

## 2013-06-23 NOTE — Assessment & Plan Note (Signed)
I have ordered an MRI to see if she has had a new CVA or has had something develop in her cerebellum or inner ear to cause so many neuro symptoms and now ataxia I will also check her for Vit B12 and Vit D defic  Her neuro issues are complicated so I have recommended that she f/up with neurology as well

## 2013-06-24 ENCOUNTER — Encounter: Payer: Self-pay | Admitting: Radiation Oncology

## 2013-06-24 ENCOUNTER — Ambulatory Visit
Admission: RE | Admit: 2013-06-24 | Discharge: 2013-06-24 | Disposition: A | Discharge status: Home / Self Care | Payer: BC Managed Care – PPO | Source: Ambulatory Visit | Attending: Radiation Oncology | Admitting: Radiation Oncology

## 2013-06-24 ENCOUNTER — Ambulatory Visit (INDEPENDENT_AMBULATORY_CARE_PROVIDER_SITE_OTHER): Payer: BC Managed Care – PPO | Admitting: General Surgery

## 2013-06-24 ENCOUNTER — Other Ambulatory Visit: Payer: Self-pay | Admitting: Internal Medicine

## 2013-06-24 ENCOUNTER — Encounter (INDEPENDENT_AMBULATORY_CARE_PROVIDER_SITE_OTHER): Payer: Self-pay | Admitting: General Surgery

## 2013-06-24 VITALS — BP 144/92 | HR 53 | Temp 96.2°F | Ht 65.0 in | Wt 150.3 lb

## 2013-06-24 VITALS — BP 125/71 | HR 62 | Temp 98.0°F | Resp 20 | Wt 150.3 lb

## 2013-06-24 DIAGNOSIS — C50919 Malignant neoplasm of unspecified site of unspecified female breast: Secondary | ICD-10-CM

## 2013-06-24 DIAGNOSIS — C50912 Malignant neoplasm of unspecified site of left female breast: Secondary | ICD-10-CM

## 2013-06-24 DIAGNOSIS — C50212 Malignant neoplasm of upper-inner quadrant of left female breast: Secondary | ICD-10-CM

## 2013-06-24 MED ORDER — RADIAPLEXRX EX GEL
Freq: Once | CUTANEOUS | Status: AC
  Administered 2013-06-24: 09:00:00 via TOPICAL

## 2013-06-24 MED ORDER — ALRA NON-METALLIC DEODORANT (RAD-ONC)
1.0000 "application " | Freq: Once | TOPICAL | Status: AC
  Administered 2013-06-24: 1 via TOPICAL

## 2013-06-24 NOTE — Progress Notes (Signed)
[facility]    Radiation Oncology [address] [place]     Marisa Forbes, M.D. [place], [place] [address]               Billie Lade, M.D., Ph.D. Phone: [phone]      Molli Hazard A. Kathrynn Running, M.D. Fax: [phone]      Radene Gunning, M.D., Ph.D.         Lurline Hare, M.D.         Grayland Jack, M.D Weekly Treatment Management Note  Name: Marisa Forbes     MRN: [id number]        CSN: [id number] Date: 06/24/2013      DOB: 05-11-1951  CC: Marisa Linger, MD         Marisa Forbes    Status: Outpatient  Diagnosis: Left breast cancer  Current Dose: 3.6 Gy  Current Fraction: 2  Planned Dose: 62.4 Gy  Narrative: Marisa Forbes was seen today for weekly treatment management. The chart was checked and port films  were reviewed. She is tolerating the radiation therapy well thus far. She continues to have problems with dizziness. She is unsteady with walking and is relying on a wheelchair for transportation to and from radiation therapy. She is scheduled for a MRI the brain later this week and will be seen by neurology in early September.  Review of patient's allergies indicates no known allergies. Current Outpatient Prescriptions  Medication Sig Dispense Refill  . amLODipine (NORVASC) 2.5 MG tablet Take 1 tablet (2.5 mg total) by mouth daily.  90 tablet  3  . aspirin EC 81 MG EC tablet Take 1 tablet (81 mg total) by mouth daily.      Marland Kitchen atorvastatin (LIPITOR) 80 MG tablet TAKE 1 TABLET BY MOUTH DAILY AT 6:00 PM  30 tablet  7  . diazepam (VALIUM) 5 MG tablet Take 1 tablet (5 mg total) by mouth every 6 (six) hours as needed for anxiety.  60 tablet  2  . glucose blood (ONETOUCH VERIO) test strip Use TID  100 each  12  . hyaluronate sodium (RADIAPLEXRX) GEL Apply topically 2 (two) times daily.      Marland Kitchen ibuprofen (ADVIL,MOTRIN) 200 MG tablet Take 200 mg by mouth every 6 (six) hours as needed. For pain      . insulin aspart (NOVOLOG) 100 UNIT/ML injection Inject 10 Units into the skin  3 (three) times daily with meals.  1 vial  11  . insulin glargine (LANTUS SOLOSTAR) 100 UNIT/ML injection Inject 31 Units into the skin at bedtime.  9 mL  12  . isosorbide mononitrate (IMDUR) 60 MG 24 hr tablet TAKE 1 TABLET BY MOUTH DAILY  30 tablet  6  . lisinopril (PRINIVIL,ZESTRIL) 40 MG tablet TAKE 1 TABLET BY MOUTH DAILY  15 tablet  0  . metFORMIN (GLUCOPHAGE-XR) 750 MG 24 hr tablet Take 1 tablet (750 mg total) by mouth daily with breakfast.  30 tablet  5  . metoprolol (LOPRESSOR) 50 MG tablet Take 50-75 mg by mouth 2 (two) times daily. TAKE 3 TABLETS IN THE MORNING AND 2 TABLETS IN THE EVENING      . nitroGLYCERIN (NITROSTAT) 0.4 MG SL tablet Place 0.4 mg under the tongue every 5 (five) minutes as needed for chest pain.      . non-metallic deodorant Thornton Papas) MISC Apply 1 application topically daily as needed.      . Ticagrelor (BRILINTA) 90 MG TABS tablet Take 1 tablet (90 mg  total) by mouth 2 (two) times daily.  60 tablet  6   No current facility-administered medications for this encounter.   Labs:  Lab Results  Component Value Date   WBC 6.4 06/12/2013   HGB 13.1 06/12/2013   HCT 38.6 06/12/2013   MCV 89.0 06/12/2013   PLT 114* 06/12/2013   Lab Results  Component Value Date   CREATININE 0.6 06/12/2013   BUN 9.2 06/12/2013   NA 144 06/12/2013   K 3.9 06/12/2013   CL 109 05/19/2013   CO2 24 06/12/2013   Lab Results  Component Value Date   ALT 14 06/12/2013   AST 9 06/12/2013   BILITOT 0.73 06/12/2013    Physical Examination:  weight is 150 lb 4.8 oz (68.176 kg). Her oral temperature is 98 F (36.7 C). Her blood pressure is 125/71 and her pulse is 62. Her respiration is 20.    Wt Readings from Last 3 Encounters:  06/24/13 150 lb 4.8 oz (68.176 kg)  06/23/13 150 lb (68.04 kg)  06/20/13 146 lb 1.9 oz (66.28 kg)    The left breast area shows no significant radiation reaction at this time.  The lumpectomy scar is well-healed. Lungs - Normal respiratory effort, chest expands  symmetrically. Lungs are clear to auscultation, no crackles or wheezes.  Heart has regular rhythm and rate  Abdomen is soft and non tender with normal bowel sounds  Assessment:  Patient tolerating treatments well  Plan: Continue treatment per original radiation prescription

## 2013-06-24 NOTE — Progress Notes (Addendum)
Pt denies pain, fatigue, loss of appetite. She states she has had dizziness x 3-4 weeks. She has fallen x 2, denies nausea, other problems associated w/dizziness. She is scheduled for MRI brain 06/27/13, appointment w/neurologist 07/10/13. Pt also questioning if she can have left cataract removed; she has been released by her cardiologist for this surgery. She will discuss w/Dr Roselind Messier today. Post sim ed completed w/pt and spouse.  Gave pt "Radiation and You" booklet w/all pertinent information marked and discussed, re: fatigue, skin irritation/care, nutrition, pain. All questions answered. Gave pt Radiaplex, Alra w/instructions for proper use. Pt verbalized understanding.

## 2013-06-25 ENCOUNTER — Encounter (INDEPENDENT_AMBULATORY_CARE_PROVIDER_SITE_OTHER): Payer: Self-pay

## 2013-06-25 ENCOUNTER — Ambulatory Visit
Admission: RE | Admit: 2013-06-25 | Discharge: 2013-06-25 | Disposition: A | Discharge status: Home / Self Care | Payer: BC Managed Care – PPO | Source: Ambulatory Visit | Attending: Radiation Oncology | Admitting: Radiation Oncology

## 2013-06-25 ENCOUNTER — Ambulatory Visit: Payer: BC Managed Care – PPO | Admitting: Rehabilitative and Restorative Service Providers"

## 2013-06-25 NOTE — Progress Notes (Signed)
Subjective:     Patient ID: Marisa Forbes, female   DOB: Dec 04, 1950, 62 y.o.   MRN: [id number]  HPI  62 yof who had DES placed s/p MI in November 2013 underwent left lumpectomy/snbx through integrilin window.   She then had a port placed.  She has undergone chemotherapy and xrt now.  She is doing well from this standpoint but is undergoing evaluation for possible ataxia with mr and neurology appt soon.  She reports no complatints with either breast.  She would like to have port removedd Review of Systems     Objective:   Physical Exam    left breast without mass, incisions healed,  Right breast without mass No cervical/Willard/axillary lad cv RRR Lungs clear Assessment:     Stage I left breast cancer, tnbc     Plan:     I think awaiting the results of neurology information as well as asking her cardiologist when she could come off brilinta.  I think if it is a year then we should wait until end of year to remove the port.  I will see her back once we know these pieces of information.

## 2013-06-26 ENCOUNTER — Ambulatory Visit
Admission: RE | Admit: 2013-06-26 | Discharge: 2013-06-26 | Disposition: A | Discharge status: Home / Self Care | Payer: BC Managed Care – PPO | Source: Ambulatory Visit | Attending: Radiation Oncology | Admitting: Radiation Oncology

## 2013-06-26 ENCOUNTER — Encounter: Payer: Self-pay | Admitting: Internal Medicine

## 2013-06-26 ENCOUNTER — Other Ambulatory Visit (INDEPENDENT_AMBULATORY_CARE_PROVIDER_SITE_OTHER): Payer: BC Managed Care – PPO

## 2013-06-26 DIAGNOSIS — I10 Essential (primary) hypertension: Secondary | ICD-10-CM

## 2013-06-26 DIAGNOSIS — R270 Ataxia, unspecified: Secondary | ICD-10-CM

## 2013-06-26 DIAGNOSIS — E119 Type 2 diabetes mellitus without complications: Secondary | ICD-10-CM

## 2013-06-26 DIAGNOSIS — R279 Unspecified lack of coordination: Secondary | ICD-10-CM

## 2013-06-26 DIAGNOSIS — E785 Hyperlipidemia, unspecified: Secondary | ICD-10-CM

## 2013-06-26 DIAGNOSIS — H8110 Benign paroxysmal vertigo, unspecified ear: Secondary | ICD-10-CM

## 2013-06-26 LAB — VITAMIN B12: Vitamin B-12: >1500 pg/mL — ABNORMAL HIGH (ref 211–911)

## 2013-06-26 LAB — LIPID PANEL
Cholesterol: 119 mg/dL (ref 0–200)
HDL: 45 mg/dL (ref >39.00)
LDL Cholesterol: 45 mg/dL (ref 0–99)
Total CHOL/HDL Ratio: 3
Triglycerides: 144 mg/dL (ref 0.0–149.0)
VLDL: 28.8 mg/dL (ref 0.0–40.0)

## 2013-06-26 LAB — HEMOGLOBIN A1C: Hgb A1c MFr Bld: 7.1 % — ABNORMAL HIGH (ref 4.6–6.5)

## 2013-06-26 LAB — COMPREHENSIVE METABOLIC PANEL
ALT: 28 U/L (ref 0–35)
AST: 22 U/L (ref 0–37)
Albumin: 4.1 g/dL (ref 3.5–5.2)
Alkaline Phosphatase: 91 U/L (ref 39–117)
BUN: 11 mg/dL (ref 6–23)
CO2: 28 mEq/L (ref 19–32)
Calcium: 9.7 mg/dL (ref 8.4–10.5)
Chloride: 104 mEq/L (ref 96–112)
Creatinine, Ser: 0.6 mg/dL (ref 0.4–1.2)
GFR: 101.59 mL/min (ref >60.00)
Glucose, Bld: 165 mg/dL — ABNORMAL HIGH (ref 70–99)
Potassium: 4 mEq/L (ref 3.5–5.1)
Sodium: 138 mEq/L (ref 135–145)
Total Bilirubin: 0.7 mg/dL (ref 0.3–1.2)
Total Protein: 6.9 g/dL (ref 6.0–8.3)

## 2013-06-26 LAB — TSH: TSH: 1.01 u[IU]/mL (ref 0.35–5.50)

## 2013-06-26 LAB — CBC WITH DIFFERENTIAL/PLATELET
Basophils Absolute: 0 10*3/uL (ref 0.0–0.1)
Basophils Relative: 0.2 % (ref 0.0–3.0)
Eosinophils Absolute: 0.1 10*3/uL (ref 0.0–0.7)
Eosinophils Relative: 0.8 % (ref 0.0–5.0)
HCT: 41.7 % (ref 36.0–46.0)
Hemoglobin: 14.1 g/dL (ref 12.0–15.0)
Lymphocytes Relative: 12.6 % (ref 12.0–46.0)
Lymphs Abs: 1 10*3/uL (ref 0.7–4.0)
MCHC: 33.9 g/dL (ref 30.0–36.0)
MCV: 89.6 fl (ref 78.0–100.0)
Monocytes Absolute: 0.5 10*3/uL (ref 0.1–1.0)
Monocytes Relative: 6.9 % (ref 3.0–12.0)
Neutro Abs: 6.1 10*3/uL (ref 1.4–7.7)
Neutrophils Relative %: 79.5 % — ABNORMAL HIGH (ref 43.0–77.0)
Platelets: 205 10*3/uL (ref 150.0–400.0)
RBC: 4.66 Mil/uL (ref 3.87–5.11)
RDW: 15.3 % — ABNORMAL HIGH (ref 11.5–14.6)
WBC: 7.6 10*3/uL (ref 4.5–10.5)

## 2013-06-27 ENCOUNTER — Encounter: Payer: Self-pay | Admitting: Internal Medicine

## 2013-06-27 ENCOUNTER — Ambulatory Visit
Admission: RE | Admit: 2013-06-27 | Discharge: 2013-06-27 | Disposition: A | Discharge status: Home / Self Care | Payer: BC Managed Care – PPO | Source: Ambulatory Visit | Attending: Radiation Oncology | Admitting: Radiation Oncology

## 2013-06-27 ENCOUNTER — Ambulatory Visit
Admission: RE | Admit: 2013-06-27 | Discharge: 2013-06-27 | Disposition: A | Discharge status: Home / Self Care | Payer: BC Managed Care – PPO | Source: Ambulatory Visit | Attending: Internal Medicine | Admitting: Internal Medicine

## 2013-06-27 DIAGNOSIS — R270 Ataxia, unspecified: Secondary | ICD-10-CM

## 2013-06-27 LAB — VITAMIN D 25 HYDROXY (VIT D DEFICIENCY, FRACTURES): Vit D, 25-Hydroxy: 30 ng/mL (ref 30–89)

## 2013-07-01 ENCOUNTER — Ambulatory Visit
Admission: RE | Admit: 2013-07-01 | Discharge: 2013-07-01 | Disposition: A | Discharge status: Home / Self Care | Payer: BC Managed Care – PPO | Source: Ambulatory Visit | Attending: Radiation Oncology | Admitting: Radiation Oncology

## 2013-07-02 ENCOUNTER — Encounter: Payer: Self-pay | Admitting: Radiation Oncology

## 2013-07-02 ENCOUNTER — Ambulatory Visit
Admission: RE | Admit: 2013-07-02 | Discharge: 2013-07-02 | Disposition: A | Discharge status: Home / Self Care | Payer: BC Managed Care – PPO | Source: Ambulatory Visit | Attending: Radiation Oncology | Admitting: Radiation Oncology

## 2013-07-02 VITALS — BP 160/105 | HR 61 | Temp 97.7°F | Resp 20 | Wt 151.0 lb

## 2013-07-02 DIAGNOSIS — C50912 Malignant neoplasm of unspecified site of left female breast: Secondary | ICD-10-CM

## 2013-07-02 NOTE — Progress Notes (Signed)
[facility]    Radiation Oncology [address] [place]     Maryln Gottron, M.D. [place], [place] [address]               Billie Lade, M.D., Ph.D. Phone: [phone]      Molli Hazard A. Kathrynn Running, M.D. Fax: [phone]      Radene Gunning, M.D., Ph.D.         Lurline Hare, M.D.         Grayland Jack, M.D Weekly Treatment Management Note  Name: CLOA BUSHONG     MRN: [id number]        CSN: [id number] Date: 07/02/2013      DOB: [birth year]  CC: Marisa Linger, MD         [name]    Status: Outpatient  Diagnosis: The encounter diagnosis was Breast cancer, left.  Current Dose: 12.6 Gy  Current Fraction: 7  Planned Dose: 62.4 GY  Narrative: [name] was seen today for weekly treatment management. The chart was checked and port films  were reviewed. She is tolerating the treatments well without any side effects this time.  Review of patient's allergies indicates no known allergies. Current Outpatient Prescriptions  Medication Sig Dispense Refill  . amLODipine (NORVASC) 2.5 MG tablet Take 1 tablet (2.5 mg total) by mouth daily.  90 tablet  3  . aspirin EC 81 MG EC tablet Take 1 tablet (81 mg total) by mouth daily.      Marland Kitchen atorvastatin (LIPITOR) 80 MG tablet TAKE 1 TABLET BY MOUTH DAILY AT 6:00 PM  30 tablet  7  . diazepam (VALIUM) 5 MG tablet Take 1 tablet (5 mg total) by mouth every 6 (six) hours as needed for anxiety.  60 tablet  2  . glucose blood (ONETOUCH VERIO) test strip Use TID  100 each  12  . hyaluronate sodium (RADIAPLEXRX) GEL Apply topically 2 (two) times daily.      Marland Kitchen ibuprofen (ADVIL,MOTRIN) 200 MG tablet Take 200 mg by mouth every 6 (six) hours as needed. For pain      . insulin aspart (NOVOLOG) 100 UNIT/ML injection Inject 10 Units into the skin 3 (three) times daily with meals.  1 vial  11  . insulin glargine (LANTUS SOLOSTAR) 100 UNIT/ML injection Inject 31 Units into the skin at bedtime.  9 mL  12  . isosorbide mononitrate (IMDUR) 60 MG 24 hr  tablet TAKE 1 TABLET BY MOUTH DAILY  30 tablet  6  . lisinopril (PRINIVIL,ZESTRIL) 40 MG tablet TAKE 1 TABLET BY MOUTH DAILY  30 tablet  0  . metFORMIN (GLUCOPHAGE-XR) 750 MG 24 hr tablet Take 1 tablet (750 mg total) by mouth daily with breakfast.  30 tablet  5  . metoprolol (LOPRESSOR) 50 MG tablet Take 50-75 mg by mouth 2 (two) times daily. TAKE 3 TABLETS IN THE MORNING AND 2 TABLETS IN THE EVENING      . nitroGLYCERIN (NITROSTAT) 0.4 MG SL tablet Place 0.4 mg under the tongue every 5 (five) minutes as needed for chest pain.      . non-metallic deodorant Thornton Papas) MISC Apply 1 application topically daily as needed.      . Ticagrelor (BRILINTA) 90 MG TABS tablet Take 1 tablet (90 mg total) by mouth 2 (two) times daily.  60 tablet  6   No current facility-administered medications for this encounter.   Labs:  Lab Results  Component Value Date   WBC 7.6 06/26/2013  HGB 14.1 06/26/2013   HCT 41.7 06/26/2013   MCV 89.6 06/26/2013   PLT 205.0 06/26/2013   Lab Results  Component Value Date   CREATININE 0.6 06/26/2013   BUN 11 06/26/2013   NA 138 06/26/2013   K 4.0 06/26/2013   CL 104 06/26/2013   CO2 28 06/26/2013   Lab Results  Component Value Date   ALT 28 06/26/2013   AST 22 06/26/2013   BILITOT 0.7 06/26/2013    Physical Examination:  weight is 151 lb (68.493 kg). Her temperature is 97.7 F (36.5 C). Her blood pressure is 160/105 and her pulse is 61. Her respiration is 20.    Wt Readings from Last 3 Encounters:  07/02/13 151 lb (68.493 kg)  06/24/13 150 lb 4.8 oz (68.176 kg)  06/24/13 150 lb 4.8 oz (68.176 kg)    The left breast area shows some slight erythema. Lungs - Normal respiratory effort, chest expands symmetrically. Lungs are clear to auscultation, no crackles or wheezes.  Heart has regular rhythm and rate  Abdomen is soft and non tender with normal bowel sounds  Assessment:  Patient tolerating treatments well  Plan: Continue treatment per original radiation prescription

## 2013-07-02 NOTE — Progress Notes (Signed)
Pt denies pain, fatigue, loss of appetite. She is applying Radiaplex to left breast treatment area; she denies skin changes at this time.

## 2013-07-03 ENCOUNTER — Ambulatory Visit
Admission: RE | Admit: 2013-07-03 | Discharge: 2013-07-03 | Disposition: A | Discharge status: Home / Self Care | Payer: BC Managed Care – PPO | Source: Ambulatory Visit | Attending: Radiation Oncology | Admitting: Radiation Oncology

## 2013-07-04 ENCOUNTER — Ambulatory Visit
Admission: RE | Admit: 2013-07-04 | Discharge: 2013-07-04 | Disposition: A | Discharge status: Home / Self Care | Payer: BC Managed Care – PPO | Source: Ambulatory Visit | Attending: Radiation Oncology | Admitting: Radiation Oncology

## 2013-07-07 ENCOUNTER — Ambulatory Visit
Admission: RE | Admit: 2013-07-07 | Discharge: 2013-07-07 | Disposition: A | Discharge status: Home / Self Care | Payer: BC Managed Care – PPO | Source: Ambulatory Visit | Attending: Radiation Oncology | Admitting: Radiation Oncology

## 2013-07-08 ENCOUNTER — Ambulatory Visit
Admission: RE | Admit: 2013-07-08 | Discharge: 2013-07-08 | Disposition: A | Discharge status: Home / Self Care | Payer: BC Managed Care – PPO | Source: Ambulatory Visit | Attending: Radiation Oncology | Admitting: Radiation Oncology

## 2013-07-08 VITALS — BP 160/91 | HR 56 | Temp 98.1°F | Ht 66.0 in | Wt 151.1 lb

## 2013-07-08 DIAGNOSIS — C50212 Malignant neoplasm of upper-inner quadrant of left female breast: Secondary | ICD-10-CM

## 2013-07-08 NOTE — Progress Notes (Signed)
Marisa Forbes here for weekly under treat visit.  She has had 11 fractions to her left breast.  She denies pain and fatigue.  The skin on her left breast is intact.  She has some redness underneath her left breast.  She has been using radiaplex gel.

## 2013-07-08 NOTE — Progress Notes (Signed)
  Radiation Oncology         [phone] ________________________________  Name: Marisa Forbes MRN: [id number]  Date: 07/08/2013  DOB: [birth year]  Weekly Radiation Therapy Management  Current Dose: 19.8 Gy     Planned Dose:  62.4 G Gy  Narrative . . . . . . . . The patient presents for routine under treatment assessment.                                                     The patient is without complaint.                                 Set-up films were reviewed.                                 The chart was checked. Physical Findings. . .  height is 5\' 6"  (1.676 m) and weight is 151 lb 1.6 oz (68.539 kg). Her temperature is 98.1 F (36.7 C). Her blood pressure is 160/91 and her pulse is 56. . Weight essentially stable.  The left breast area shows some mild erythema in the inframammary fold. Impression . . . . . . . The patient is  tolerating radiation. Plan . . . . . . . . . . . . Continue treatment as planned.  ________________________________  -----------------------------------  Billie Lade, PhD, MD

## 2013-07-09 ENCOUNTER — Ambulatory Visit
Admission: RE | Admit: 2013-07-09 | Discharge: 2013-07-09 | Disposition: A | Discharge status: Home / Self Care | Payer: BC Managed Care – PPO | Source: Ambulatory Visit | Attending: Radiation Oncology | Admitting: Radiation Oncology

## 2013-07-10 ENCOUNTER — Encounter: Payer: Self-pay | Admitting: Neurology

## 2013-07-10 ENCOUNTER — Telehealth: Payer: Self-pay | Admitting: Internal Medicine

## 2013-07-10 ENCOUNTER — Telehealth (INDEPENDENT_AMBULATORY_CARE_PROVIDER_SITE_OTHER): Payer: Self-pay

## 2013-07-10 ENCOUNTER — Ambulatory Visit (INDEPENDENT_AMBULATORY_CARE_PROVIDER_SITE_OTHER): Payer: BC Managed Care – PPO | Admitting: Neurology

## 2013-07-10 ENCOUNTER — Ambulatory Visit
Admission: RE | Admit: 2013-07-10 | Discharge: 2013-07-10 | Disposition: A | Discharge status: Home / Self Care | Payer: BC Managed Care – PPO | Source: Ambulatory Visit | Attending: Radiation Oncology | Admitting: Radiation Oncology

## 2013-07-10 VITALS — BP 175/93 | HR 66 | Ht 66.0 in | Wt 153.0 lb

## 2013-07-10 DIAGNOSIS — I6350 Cerebral infarction due to unspecified occlusion or stenosis of unspecified cerebral artery: Secondary | ICD-10-CM

## 2013-07-10 DIAGNOSIS — I639 Cerebral infarction, unspecified: Secondary | ICD-10-CM
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
## 2013-07-10 NOTE — Telephone Encounter (Signed)
New problem     Please advise on brilinta  . Status of surgery in Dec.

## 2013-07-10 NOTE — Patient Instructions (Signed)
Overall you are doing fairly well but I do want to suggest a few things today:   Remember to drink plenty of fluid, eat healthy meals and do not skip any meals. Try to eat protein with a every meal and eat a healthy snack such as fruit or nuts in between meals. Try to keep a regular sleep-wake schedule and try to exercise daily, particularly in the form of walking, 20-30 minutes a day, if you can.   As far as your medications are concerned, I would like to suggest following up with cardiology to determine if you can increase your aspirin to 325mg  or switch to Plavix.  As far as diagnostic testing: I would like to order a TCD to check your vessels. You will be contacted for this.  I would like to see you back once the study is completed, sooner if we need to. Please call us with any interim questions, concerns, problems, updates or refill requests.   Please also call us for any test results so we can go over those with you on the phone.  My clinical assistant and will answer any of your questions and relay your messages to me and also relay most of my messages to you.   Our phone number is [phone]. We also have an after hours call service for urgent matters and there is a physician on-call for urgent questions. For any emergencies you know to call 911 or go to the nearest emergency room

## 2013-07-10 NOTE — Telephone Encounter (Signed)
LM with the nurse checking on the status of the cardiac clearance. I wanted to check on the directions of the pt stopping her Brilinta in November and we would schedule the pt for December. I am just waiting to hear back from Dr Charlott Rakes office.

## 2013-07-10 NOTE — Telephone Encounter (Signed)
Routed to Dr. Ross 

## 2013-07-11 ENCOUNTER — Other Ambulatory Visit: Payer: Self-pay | Admitting: Neurology

## 2013-07-11 ENCOUNTER — Ambulatory Visit
Admission: RE | Admit: 2013-07-11 | Discharge: 2013-07-11 | Disposition: A | Discharge status: Home / Self Care | Payer: BC Managed Care – PPO | Source: Ambulatory Visit | Attending: Radiation Oncology | Admitting: Radiation Oncology

## 2013-07-11 DIAGNOSIS — I6340 Cerebral infarction due to embolism of unspecified cerebral artery: Secondary | ICD-10-CM

## 2013-07-14 ENCOUNTER — Ambulatory Visit
Admission: RE | Admit: 2013-07-14 | Discharge: 2013-07-14 | Disposition: A | Discharge status: Home / Self Care | Payer: BC Managed Care – PPO | Source: Ambulatory Visit | Attending: Radiation Oncology | Admitting: Radiation Oncology

## 2013-07-15 ENCOUNTER — Ambulatory Visit
Admission: RE | Admit: 2013-07-15 | Discharge: 2013-07-15 | Disposition: A | Discharge status: Home / Self Care | Payer: BC Managed Care – PPO | Source: Ambulatory Visit | Attending: Radiation Oncology | Admitting: Radiation Oncology

## 2013-07-15 ENCOUNTER — Encounter: Payer: Self-pay | Admitting: Radiation Oncology

## 2013-07-15 VITALS — BP 147/89 | HR 64 | Resp 18 | Wt 149.9 lb

## 2013-07-15 DIAGNOSIS — C50212 Malignant neoplasm of upper-inner quadrant of left female breast: Secondary | ICD-10-CM

## 2013-07-15 NOTE — Progress Notes (Signed)
Denies skin changes to left/treated breast. Reports using radiaplex and alra as directed. Denies fatigue. No complaints at this time. No edema of left arm noted.

## 2013-07-15 NOTE — Progress Notes (Signed)
Weekly Management Note Current Dose:  28.8 Gy  Projected Dose:50.4  Gy   Narrative:  The patient presents for routine under treatment assessment.  CBCT/MVCT images/Port film x-rays were reviewed.  The chart was checked. No complaints.   Physical Findings: Weight: 149 lb 14.4 oz (67.994 kg). Unchanged  Impression:  The patient is tolerating radiation.  Plan:  Continue treatment as planned. Continue radiaplex.

## 2013-07-16 ENCOUNTER — Ambulatory Visit
Admission: RE | Admit: 2013-07-16 | Discharge: 2013-07-16 | Disposition: A | Discharge status: Home / Self Care | Payer: BC Managed Care – PPO | Source: Ambulatory Visit | Attending: Radiation Oncology | Admitting: Radiation Oncology

## 2013-07-17 ENCOUNTER — Ambulatory Visit
Admission: RE | Admit: 2013-07-17 | Discharge: 2013-07-17 | Disposition: A | Discharge status: Home / Self Care | Payer: BC Managed Care – PPO | Source: Ambulatory Visit | Attending: Radiation Oncology | Admitting: Radiation Oncology

## 2013-07-18 ENCOUNTER — Ambulatory Visit
Admission: RE | Admit: 2013-07-18 | Discharge: 2013-07-18 | Disposition: A | Discharge status: Home / Self Care | Payer: BC Managed Care – PPO | Source: Ambulatory Visit | Attending: Radiation Oncology | Admitting: Radiation Oncology

## 2013-07-21 ENCOUNTER — Ambulatory Visit
Admission: RE | Admit: 2013-07-21 | Discharge: 2013-07-21 | Disposition: A | Discharge status: Home / Self Care | Payer: BC Managed Care – PPO | Source: Ambulatory Visit | Attending: Radiation Oncology | Admitting: Radiation Oncology

## 2013-07-22 ENCOUNTER — Ambulatory Visit
Admission: RE | Admit: 2013-07-22 | Discharge: 2013-07-22 | Disposition: A | Discharge status: Home / Self Care | Payer: BC Managed Care – PPO | Source: Ambulatory Visit | Attending: Radiation Oncology | Admitting: Radiation Oncology

## 2013-07-22 ENCOUNTER — Encounter: Payer: Self-pay | Admitting: Radiation Oncology

## 2013-07-22 VITALS — BP 191/98 | HR 63 | Temp 98.4°F | Ht 66.0 in | Wt 151.4 lb

## 2013-07-22 DIAGNOSIS — C50212 Malignant neoplasm of upper-inner quadrant of left female breast: Secondary | ICD-10-CM

## 2013-07-22 NOTE — Progress Notes (Signed)
[facility]    Radiation Oncology [address] [place]     Maryln Gottron, M.D. [place], [place] [address]               Billie Lade, M.D., Ph.D. Phone: [phone]      Molli Hazard A. Kathrynn Running, M.D. Fax: [phone]      Radene Gunning, M.D., Ph.D.         Lurline Hare, M.D.         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  Current Dose: 37.8 Gy  Current Fraction: 21  Planned Dose: 62.4 Gy  Narrative: Marisa Forbes was seen today for weekly treatment management. The chart was checked and port films  were reviewed. She continues to tolerate her treatments quite well without any significant fatigue itching or discomfort within the breast area.  Review of patient's allergies indicates no known allergies.  Current Outpatient Prescriptions  Medication Sig Dispense Refill  . amLODipine (NORVASC) 2.5 MG tablet Take 1 tablet (2.5 mg total) by mouth daily.  90 tablet  3  . aspirin EC 81 MG EC tablet Take 1 tablet (81 mg total) by mouth daily.      Marland Kitchen atorvastatin (LIPITOR) 80 MG tablet TAKE 1 TABLET BY MOUTH DAILY AT 6:00 PM  30 tablet  7  . diazepam (VALIUM) 5 MG tablet Take 1 tablet (5 mg total) by mouth every 6 (six) hours as needed for anxiety.  60 tablet  2  . glucose blood (ONETOUCH VERIO) test strip Use TID  100 each  12  . hyaluronate sodium (RADIAPLEXRX) GEL Apply topically 2 (two) times daily.      Marland Kitchen ibuprofen (ADVIL,MOTRIN) 200 MG tablet Take 200 mg by mouth every 6 (six) hours as needed. For pain      . insulin aspart (NOVOLOG) 100 UNIT/ML injection Inject 10 Units into the skin 3 (three) times daily with meals.  1 vial  11  . insulin glargine (LANTUS SOLOSTAR) 100 UNIT/ML injection Inject 31  Units into the skin at bedtime.  9 mL  12  . isosorbide mononitrate (IMDUR) 60 MG 24 hr tablet TAKE 1 TABLET BY MOUTH DAILY  30 tablet  6  . lisinopril (PRINIVIL,ZESTRIL) 40 MG tablet TAKE 1 TABLET BY MOUTH DAILY  30 tablet  0  . metFORMIN (GLUCOPHAGE-XR) 750 MG 24 hr tablet Take 1 tablet (750 mg total) by mouth daily with breakfast.  30 tablet  5  . metoprolol (LOPRESSOR) 50 MG tablet Take 50-75 mg by mouth 2 (two) times daily. TAKE 3 TABLETS IN THE MORNING AND 2 TABLETS IN THE EVENING      . non-metallic deodorant (ALRA) MISC Apply 1 application topically daily as needed.      . Ticagrelor (BRILINTA) 90 MG TABS tablet Take 1 tablet (90 mg total) by mouth 2 (two) times daily.  60 tablet  6  . nitroGLYCERIN (NITROSTAT) 0.4 MG SL tablet Place 0.4 mg under the tongue every 5 (five) minutes as needed for chest pain.       No current facility-administered medications for this encounter.  Physical Examination:  height is 5\' 6"  (1.676 m) and weight is 151 lb 6.4 oz (68.675 kg). Her temperature is 98.4 F (36.9 C). Her blood pressure is 191/98 and her pulse is 63.    Wt Readings from Last 3 Encounters:  07/22/13 151 lb 6.4 oz (68.675 kg)  07/15/13 149 lb 14.4 oz (67.994 kg)  07/10/13 153 lb (69.4 kg)    The inframammary fold area shows hyperpigmentation changes and erythema without moist desquamation. Patient will start using her cream in this area. Lungs - Normal respiratory effort, chest expands symmetrically. Lungs are clear to auscultation, no crackles or wheezes.  Heart has regular rhythm and rate  Abdomen is soft and non tender with normal bowel sounds  Assessment:  Patient tolerating treatments well  Plan: Continue treatment per original radiation prescription

## 2013-07-22 NOTE — Progress Notes (Signed)
Marisa Forbes here for weekly under treat visit.  She has had 21 fractions to her left breast.  She denies pain and fatigue.  The skin underneath her left breast is red and peeling.  She reports that it is not painful and she had not noticed it until today.  She reports she was using the radiaplex gel once a day.  She says that she will start using the radiaplex al least twice a day.

## 2013-07-22 NOTE — Progress Notes (Signed)
   Department of Radiation Oncology  Phone:  [phone] Fax:        [phone]  Simulation note  Today the patient underwent additional planning for radiation therapy directed at the left breast area. The patient's treatment planning CT scan was reviewed and she had set up of a computerized isodose plan covering the lumpectomy cavity. Given the depth within the breast area photons will be used for her boost field. Patient will be treated with 4 separate multileaf collimator settings. The patient received 6 additional treatments at 2 gray per day for boost dose of 12 gray.  -----------------------------------  Billie Lade, PhD, MD

## 2013-07-23 ENCOUNTER — Ambulatory Visit
Admission: RE | Admit: 2013-07-23 | Discharge: 2013-07-23 | Disposition: A | Discharge status: Home / Self Care | Payer: BC Managed Care – PPO | Source: Ambulatory Visit | Attending: Radiation Oncology | Admitting: Radiation Oncology

## 2013-07-23 NOTE — Telephone Encounter (Signed)
See note below regarding phone call to [place], at [facility].

## 2013-07-23 NOTE — Telephone Encounter (Signed)
Dr Charlott Rakes office notified us that the pt will be able to stop her Brilinta in December. They are making a note in epic under encounter for the chart.

## 2013-07-23 NOTE — Telephone Encounter (Signed)
OK to stop Brilinta in December. With history of stent to CAD and diffuse to

## 2013-07-23 NOTE — Telephone Encounter (Signed)
Spoke to [name], at [facility], to inform them that patient can stop Brilinta in December for upcoming surgery. Dr. Tenny Craw reply - "OK to stop Brilinta in December. With history of stent to CAD and diffuse to".

## 2013-07-24 ENCOUNTER — Ambulatory Visit
Admission: RE | Admit: 2013-07-24 | Discharge: 2013-07-24 | Disposition: A | Discharge status: Home / Self Care | Payer: BC Managed Care – PPO | Source: Ambulatory Visit | Attending: Radiation Oncology | Admitting: Radiation Oncology

## 2013-07-25 ENCOUNTER — Ambulatory Visit
Admission: RE | Admit: 2013-07-25 | Discharge: 2013-07-25 | Disposition: A | Discharge status: Home / Self Care | Payer: BC Managed Care – PPO | Source: Ambulatory Visit | Attending: Radiation Oncology | Admitting: Radiation Oncology

## 2013-07-28 ENCOUNTER — Ambulatory Visit
Admission: RE | Admit: 2013-07-28 | Discharge: 2013-07-28 | Disposition: A | Discharge status: Home / Self Care | Payer: BC Managed Care – PPO | Source: Ambulatory Visit | Attending: Radiation Oncology | Admitting: Radiation Oncology

## 2013-07-28 ENCOUNTER — Ambulatory Visit (HOSPITAL_BASED_OUTPATIENT_CLINIC_OR_DEPARTMENT_OTHER): Payer: BC Managed Care – PPO

## 2013-07-28 ENCOUNTER — Telehealth: Payer: Self-pay | Admitting: Oncology

## 2013-07-28 VITALS — BP 140/76 | HR 66 | Temp 98.1°F | Resp 16

## 2013-07-28 DIAGNOSIS — Z452 Encounter for adjustment and management of vascular access device: Secondary | ICD-10-CM

## 2013-07-28 DIAGNOSIS — C50219 Malignant neoplasm of upper-inner quadrant of unspecified female breast: Secondary | ICD-10-CM

## 2013-07-28 MED ORDER — SODIUM CHLORIDE 0.9 % IJ SOLN
10.0000 mL | INTRAMUSCULAR | Status: DC | PRN
  Administered 2013-07-28: 10 mL via INTRAVENOUS
  Filled 2013-07-28: qty 10

## 2013-07-28 MED ORDER — HEPARIN SOD (PORK) LOCK FLUSH 100 UNIT/ML IV SOLN
500.0000 [IU] | Freq: Once | INTRAVENOUS | Status: AC
  Administered 2013-07-28: 500 [IU] via INTRAVENOUS
  Filled 2013-07-28: qty 5

## 2013-07-28 NOTE — Telephone Encounter (Signed)
pt came in and needed flush appts...done

## 2013-07-29 ENCOUNTER — Ambulatory Visit
Admission: RE | Admit: 2013-07-29 | Discharge: 2013-07-29 | Disposition: A | Discharge status: Home / Self Care | Payer: BC Managed Care – PPO | Source: Ambulatory Visit | Attending: Radiation Oncology | Admitting: Radiation Oncology

## 2013-07-29 DIAGNOSIS — C50212 Malignant neoplasm of upper-inner quadrant of left female breast: Secondary | ICD-10-CM

## 2013-07-29 NOTE — Progress Notes (Signed)
  Weekly rad tx , not treated as yet, has had 25 txs so far, erythema, dry desquamation under inframmary fold a, and on top of breast, using radiaplex gel bid, suggested to stop wearing underwire bra, , no c/o pain or itching, good appetite, slight fatigue 9:15 AM

## 2013-07-29 NOTE — Progress Notes (Signed)
[facility]    Radiation Oncology [address] [place]     Maryln Gottron, M.D. [place], [place] [address]               Billie Lade, M.D., Ph.D. Phone: [phone]      Molli Hazard A. Kathrynn Running, M.D. Fax: [phone]      Radene Gunning, M.D., Ph.D.         Lurline Hare, M.D.         Grayland Jack, M.D Weekly Treatment Management Note  Name: Marisa Forbes     MRN: [id number]        CSN: [id number] Date: 07/29/2013      DOB: [birth year]  CC: Marisa Linger, MD         [name]    Status: Outpatient  Diagnosis: The encounter diagnosis was Cancer of upper-inner quadrant of female breast, left.  Current Dose: 46.8 Gy  Current Fraction: 26  Planned Dose: 62.4 Gy  Narrative: Marisa Forbes was seen today for weekly treatment management. The chart was checked and port films  were reviewed. She continues to tolerate her treatments well at this time. She has minimal itching and discomfort within the breast area.  Review of patient's allergies indicates no known allergies. Current Outpatient Prescriptions  Medication Sig Dispense Refill  . amLODipine (NORVASC) 2.5 MG tablet Take 1 tablet (2.5 mg total) by mouth daily.  90 tablet  3  . aspirin EC 81 MG EC tablet Take 1 tablet (81 mg total) by mouth daily.      Marland Kitchen atorvastatin (LIPITOR) 80 MG tablet TAKE 1 TABLET BY MOUTH DAILY AT 6:00 PM  30 tablet  7  . diazepam (VALIUM) 5 MG tablet Take 1 tablet (5 mg total) by mouth every 6 (six) hours as needed for anxiety.  60 tablet  2  . glucose blood (ONETOUCH VERIO) test strip Use TID  100 each  12  . hyaluronate sodium (RADIAPLEXRX) GEL Apply topically 2 (two) times daily.      Marland Kitchen ibuprofen (ADVIL,MOTRIN) 200 MG tablet Take 200 mg by mouth every 6 (six) hours as needed. For pain      . insulin aspart (NOVOLOG) 100 UNIT/ML injection Inject 10 Units into the skin 3 (three) times daily with meals.  1 vial  11  . insulin glargine (LANTUS SOLOSTAR) 100 UNIT/ML injection Inject 31 Units  into the skin at bedtime.  9 mL  12  . isosorbide mononitrate (IMDUR) 60 MG 24 hr tablet TAKE 1 TABLET BY MOUTH DAILY  30 tablet  6  . lisinopril (PRINIVIL,ZESTRIL) 40 MG tablet TAKE 1 TABLET BY MOUTH DAILY  30 tablet  0  . metFORMIN (GLUCOPHAGE-XR) 750 MG 24 hr tablet Take 1 tablet (750 mg total) by mouth daily with breakfast.  30 tablet  5  . metoprolol (LOPRESSOR) 50 MG tablet Take 50-75 mg by mouth 2 (two) times daily. TAKE 3 TABLETS IN THE MORNING AND 2 TABLETS IN THE EVENING      . nitroGLYCERIN (NITROSTAT) 0.4 MG SL tablet Place 0.4 mg under the tongue every 5 (five) minutes as needed for chest pain.      . non-metallic deodorant Thornton Papas) MISC Apply 1 application topically daily as needed.      . Ticagrelor (BRILINTA) 90 MG TABS tablet Take 1 tablet (90 mg total) by mouth 2 (two) times daily.  60 tablet  6   No current facility-administered medications for this encounter.  Labs: Physical Examination:  vitals were not taken for this visit.   Wt Readings from Last 3 Encounters:  07/22/13 151 lb 6.4 oz (68.675 kg)  07/15/13 149 lb 14.4 oz (67.994 kg)  07/10/13 153 lb (69.4 kg)    The left breast area shows hyperpigmentation changes. Erythema and dry desquamation is noted in the inframammary fold but no moist desquamation. Lungs - Normal respiratory effort, chest expands symmetrically. Lungs are clear to auscultation, no crackles or wheezes.  Heart has regular rhythm and rate  Abdomen is soft and non tender with normal bowel sounds  Assessment:  Patient tolerating treatments well  Plan: Continue treatment per original radiation prescription

## 2013-07-30 ENCOUNTER — Ambulatory Visit
Admission: RE | Admit: 2013-07-30 | Discharge: 2013-07-30 | Disposition: A | Discharge status: Home / Self Care | Payer: BC Managed Care – PPO | Source: Ambulatory Visit | Attending: Radiation Oncology | Admitting: Radiation Oncology

## 2013-07-31 ENCOUNTER — Ambulatory Visit
Admission: RE | Admit: 2013-07-31 | Discharge: 2013-07-31 | Disposition: A | Discharge status: Home / Self Care | Payer: BC Managed Care – PPO | Source: Ambulatory Visit | Attending: Radiation Oncology | Admitting: Radiation Oncology

## 2013-08-01 ENCOUNTER — Telehealth: Payer: Self-pay | Admitting: Internal Medicine

## 2013-08-01 ENCOUNTER — Ambulatory Visit
Admission: RE | Admit: 2013-08-01 | Discharge: 2013-08-01 | Disposition: A | Discharge status: Home / Self Care | Payer: BC Managed Care – PPO | Source: Ambulatory Visit | Attending: Radiation Oncology | Admitting: Radiation Oncology

## 2013-08-01 ENCOUNTER — Other Ambulatory Visit: Payer: Self-pay | Admitting: Neurology

## 2013-08-01 ENCOUNTER — Ambulatory Visit: Payer: BC Managed Care – PPO

## 2013-08-01 ENCOUNTER — Encounter: Payer: Self-pay | Admitting: Neurology

## 2013-08-01 ENCOUNTER — Ambulatory Visit: Payer: BC Managed Care – PPO | Admitting: Neurology

## 2013-08-01 DIAGNOSIS — I6340 Cerebral infarction due to embolism of unspecified cerebral artery: Secondary | ICD-10-CM

## 2013-08-01 NOTE — Patient Instructions (Addendum)
Patient was here for test but incorrect test was ordered.  Vitals were taken and BP was 177/101, 180/90 and manuel pressure of 160/100.  Patient was told to contact her heart doctor or PCP in regards to the high Blood pressures.  She agreed.  Patient to check out in NAD.

## 2013-08-01 NOTE — Telephone Encounter (Signed)
Follow Up:  Pt states she is returning Debra's call

## 2013-08-01 NOTE — Telephone Encounter (Signed)
Pt is scheduled for cataract surgery 08-04-13 and also 08-12-13. Discussed with dr Tenny Craw, the pt will not be able to come off the brilinta until after November. Left message for pt to call

## 2013-08-01 NOTE — Progress Notes (Signed)
Subjective:    Patient ID: Marisa Forbes is a 62 y.o. female.  HPI see patient instructions  Review of Systems  See patient instructions  Objective:  Neurologic Exam  See patient instructions   Physical Exam  See patient instructions   Assessment:    See patient instructions  Plan:   See patient instructions with notes.

## 2013-08-01 NOTE — Telephone Encounter (Signed)
New problem    Form was drop office on Tuesday 10/2 . Checking on status .

## 2013-08-01 NOTE — Telephone Encounter (Signed)
Spoke with pt, she does not need to stop the brilinta. Will find out from dr Tenny Craw if she is clear for the procedure.

## 2013-08-01 NOTE — Telephone Encounter (Signed)
Discussed with dr Tenny Craw, pt is clear for cataract surgery as long as brintila is not stopped. Pt made aware.

## 2013-08-03 ENCOUNTER — Other Ambulatory Visit: Payer: Self-pay | Admitting: Cardiovascular Disease

## 2013-08-04 ENCOUNTER — Ambulatory Visit
Admission: RE | Admit: 2013-08-04 | Discharge: 2013-08-04 | Disposition: A | Discharge status: Home / Self Care | Payer: BC Managed Care – PPO | Source: Ambulatory Visit | Attending: Radiation Oncology | Admitting: Radiation Oncology

## 2013-08-05 ENCOUNTER — Ambulatory Visit
Admission: RE | Admit: 2013-08-05 | Discharge: 2013-08-05 | Disposition: A | Discharge status: Home / Self Care | Payer: BC Managed Care – PPO | Source: Ambulatory Visit | Attending: Radiation Oncology | Admitting: Radiation Oncology

## 2013-08-05 DIAGNOSIS — C50212 Malignant neoplasm of upper-inner quadrant of left female breast: Secondary | ICD-10-CM

## 2013-08-05 MED ORDER — LISINOPRIL 40 MG PO TABS: 40.0000 mg | ORAL_TABLET | Freq: Every day | ORAL | Status: DC

## 2013-08-05 NOTE — Progress Notes (Signed)
  Radiation Oncology         [phone] ________________________________  Name: Marisa Forbes MRN: [id number]  Date: 08/05/2013  DOB: 1951-10-01  Simulation Verification Note- performed on 08/01/13  Status: outpatient  NARRATIVE: The patient was brought to the treatment unit and placed in the planned treatment position. The clinical setup was verified. Then port films were obtained and uploaded to the radiation oncology medical record software.  The treatment beams were carefully compared against the planned radiation fields. The position location and shape of the radiation fields was reviewed. They targeted volume of tissue appears to be appropriately covered by the radiation beams. Organs at risk appear to be excluded as planned.  Based on my personal review, I approved the simulation verification. The patient's treatment will proceed as planned.  -----------------------------------  Billie Lade, PhD, MD

## 2013-08-05 NOTE — Telephone Encounter (Signed)
Called patient to inquire about Metoprolol dosage. She is, in fact, taking Metoprolol Tartrate 50mg  tablets - 2.5 tablets twice daily. She asked about Lisinopril 40mg , she has no refills left.  Rx for both prescriptions were sent to pharmacy electronically.

## 2013-08-06 ENCOUNTER — Ambulatory Visit: Payer: BC Managed Care – PPO

## 2013-08-06 ENCOUNTER — Ambulatory Visit
Admission: RE | Admit: 2013-08-06 | Discharge: 2013-08-06 | Disposition: A | Discharge status: Home / Self Care | Payer: BC Managed Care – PPO | Source: Ambulatory Visit | Attending: Radiation Oncology | Admitting: Radiation Oncology

## 2013-08-06 ENCOUNTER — Telehealth: Payer: Self-pay | Admitting: *Deleted

## 2013-08-06 ENCOUNTER — Telehealth: Payer: Self-pay | Admitting: Oncology

## 2013-08-06 VITALS — BP 208/98 | HR 61 | Temp 98.2°F | Ht 66.0 in | Wt 151.2 lb

## 2013-08-06 DIAGNOSIS — C50912 Malignant neoplasm of unspecified site of left female breast: Secondary | ICD-10-CM

## 2013-08-06 NOTE — Telephone Encounter (Signed)
Received a call from karen, the pt is there and her bp is 214/95 and repeat is 208/98. The pt has taken all her meds this am. They will send the pt home and I will call her to discuss.

## 2013-08-06 NOTE — Progress Notes (Signed)
[facility]    Radiation Oncology [address] [place]     Maryln Gottron, M.D. [place], [place] [address]               Billie Lade, M.D., Ph.D. Phone: [phone]      Molli Hazard A. Kathrynn Running, M.D. Fax: [phone]      Radene Gunning, M.D., Ph.D.         Lurline Hare, M.D.         Grayland Jack, M.D Weekly Treatment Management Note  Name: Marisa Forbes     MRN: [id number]        CSN: [id number] Date: 08/06/2013      DOB: 1951/01/21  CC: [name], MD         [name]    Status: Outpatient  Diagnosis: The encounter diagnosis was Breast cancer, left.  Current Dose: 58.4 Gy  Current Fraction: 32  Planned Dose: 62.4 Gy  Narrative: [name] was seen today for weekly treatment management. The chart was checked and port films  were reviewed. She is doing well this week. She has minimal itching and discomfort in the left breast. She denies any significant fatigue.  The patient's blood pressure is elevated today and we have contacted her cardiologist to see if there any additional instructions concerning her blood pressure. Patient did take her blood pressure medication this morning.  Review of patient's allergies indicates no known allergies. Current Outpatient Prescriptions  Medication Sig Dispense Refill  . amLODipine (NORVASC) 2.5 MG tablet Take 1 tablet (2.5 mg total) by mouth daily.  90 tablet  3  . aspirin EC 81 MG EC tablet Take 1 tablet (81 mg total) by mouth daily.      Marland Kitchen atorvastatin (LIPITOR) 80 MG tablet TAKE 1 TABLET BY MOUTH DAILY AT 6:00 PM  30 tablet  7  . diazepam (VALIUM) 5 MG tablet Take 1 tablet (5 mg total) by mouth every 6 (six) hours as needed for anxiety.  60 tablet  2  . glucose blood (ONETOUCH VERIO) test strip Use TID  100 each  12  . hyaluronate sodium (RADIAPLEXRX) GEL Apply topically 2 (two) times daily.      Marland Kitchen ibuprofen (ADVIL,MOTRIN) 200 MG tablet Take 200 mg by mouth every 6 (six) hours as needed. For pain      . insulin  aspart (NOVOLOG) 100 UNIT/ML injection Inject 10 Units into the skin 3 (three) times daily with meals.  1 vial  11  . insulin glargine (LANTUS SOLOSTAR) 100 UNIT/ML injection Inject 31 Units into the skin at bedtime.  9 mL  12  . isosorbide mononitrate (IMDUR) 60 MG 24 hr tablet TAKE 1 TABLET BY MOUTH DAILY  30 tablet  6  . lisinopril (PRINIVIL,ZESTRIL) 40 MG tablet Take 1 tablet (40 mg total) by mouth daily.  30 tablet  4  . metFORMIN (GLUCOPHAGE-XR) 750 MG 24 hr tablet Take 1 tablet (750 mg total) by mouth daily with breakfast.  30 tablet  5  . metoprolol (LOPRESSOR) 50 MG tablet Take 2.5 tablets (125 mg total) by mouth 2 (two) times daily.  150 tablet  4  . nitroGLYCERIN (NITROSTAT) 0.4 MG SL tablet Place 0.4 mg under the tongue every 5 (five) minutes as needed for chest pain.      . non-metallic deodorant Thornton Papas) MISC Apply 1 application topically daily as needed.      . Ticagrelor (BRILINTA) 90 MG TABS tablet Take 1 tablet (90 mg  total) by mouth 2 (two) times daily.  60 tablet  6   No current facility-administered medications for this encounter.   Labs:  Lab Results  Component Value Date   WBC 7.6 06/26/2013   HGB 14.1 06/26/2013   HCT 41.7 06/26/2013   MCV 89.6 06/26/2013   PLT 205.0 06/26/2013   Lab Results  Component Value Date   CREATININE 0.6 06/26/2013   BUN 11 06/26/2013   NA 138 06/26/2013   K 4.0 06/26/2013   CL 104 06/26/2013   CO2 28 06/26/2013   Lab Results  Component Value Date   ALT 28 06/26/2013   AST 22 06/26/2013   BILITOT 0.7 06/26/2013    Physical Examination:  height is 5\' 6"  (1.676 m) and weight is 151 lb 3.2 oz (68.584 kg). Her temperature is 98.2 F (36.8 C). Her blood pressure is 208/98 and her pulse is 61.    Wt Readings from Last 3 Encounters:  08/06/13 151 lb 3.2 oz (68.584 kg)  07/22/13 151 lb 6.4 oz (68.675 kg)  07/15/13 149 lb 14.4 oz (67.994 kg)    The left breast area shows hyperpigmentation changes and erythema but no moist desquamation. Lungs -  Normal respiratory effort, chest expands symmetrically. Lungs are clear to auscultation, no crackles or wheezes.  Heart has regular rhythm and rate  Abdomen is soft and non tender with normal bowel sounds  Assessment:  Patient tolerating treatments well  Plan: Continue treatment per original radiation prescription

## 2013-08-06 NOTE — Telephone Encounter (Signed)
Darlyn Read, Dr. Charlott Rakes nurse at [facility].  Let her know that Marisa Forbes's bp was 215/95, HR 60 and when rechecked was 208/98 and HR 61.  Advised her that Marisa Forbes did take her bp medications this am.  Asked if we need to give her anything while she is in the clinic.  Stanton Kidney said we did not need to do anything and that she would call Marisa Forbes this morning when she gets home.

## 2013-08-06 NOTE — Telephone Encounter (Signed)
Spoke with pt, she checked her bp when she got home today and it was down to 176/83. She reports having a lot of things going on. She does track her bp at home and it is running good. She has a follow up appt with dr Tenny Craw in November. She is going to track her bp and call if she is concerned with the numbers prior to her appt.

## 2013-08-06 NOTE — Progress Notes (Signed)
Tanda Rockers here for under treat visit.  She denies pain but has noticed aching in her joints especially her shoulders.  She denies fatigue.  The skin on her left breast and underarm is red.  She has peeling area under her breast and under her arm.  Her bp on arrival was 214/95.  It was 208/98.  She did take her blood pressure medication this morning.

## 2013-08-07 ENCOUNTER — Ambulatory Visit
Admission: RE | Admit: 2013-08-07 | Discharge: 2013-08-07 | Disposition: A | Discharge status: Home / Self Care | Payer: BC Managed Care – PPO | Source: Ambulatory Visit | Attending: Radiation Oncology | Admitting: Radiation Oncology

## 2013-08-08 ENCOUNTER — Ambulatory Visit
Admission: RE | Admit: 2013-08-08 | Discharge: 2013-08-08 | Disposition: A | Discharge status: Home / Self Care | Payer: BC Managed Care – PPO | Source: Ambulatory Visit | Attending: Radiation Oncology | Admitting: Radiation Oncology

## 2013-08-12 ENCOUNTER — Telehealth: Payer: Self-pay | Admitting: *Deleted

## 2013-08-12 ENCOUNTER — Other Ambulatory Visit (HOSPITAL_BASED_OUTPATIENT_CLINIC_OR_DEPARTMENT_OTHER): Payer: BC Managed Care – PPO | Admitting: Lab

## 2013-08-12 ENCOUNTER — Ambulatory Visit (HOSPITAL_BASED_OUTPATIENT_CLINIC_OR_DEPARTMENT_OTHER): Payer: BC Managed Care – PPO | Admitting: Adult Health

## 2013-08-12 ENCOUNTER — Encounter: Payer: Self-pay | Admitting: Adult Health

## 2013-08-12 VITALS — BP 148/87 | HR 63 | Temp 97.8°F | Resp 18 | Ht 66.0 in | Wt 149.2 lb

## 2013-08-12 DIAGNOSIS — C50919 Malignant neoplasm of unspecified site of unspecified female breast: Secondary | ICD-10-CM

## 2013-08-12 DIAGNOSIS — I10 Essential (primary) hypertension: Secondary | ICD-10-CM

## 2013-08-12 DIAGNOSIS — E119 Type 2 diabetes mellitus without complications: Secondary | ICD-10-CM

## 2013-08-12 DIAGNOSIS — C50212 Malignant neoplasm of upper-inner quadrant of left female breast: Secondary | ICD-10-CM

## 2013-08-12 DIAGNOSIS — Z171 Estrogen receptor negative status [ER-]: Secondary | ICD-10-CM

## 2013-08-12 DIAGNOSIS — C50219 Malignant neoplasm of upper-inner quadrant of unspecified female breast: Secondary | ICD-10-CM

## 2013-08-12 LAB — COMPREHENSIVE METABOLIC PANEL (CC13)
ALT: 22 U/L (ref 0–55)
AST: 15 U/L (ref 5–34)
Albumin: 3.5 g/dL (ref 3.5–5.0)
Alkaline Phosphatase: 88 U/L (ref 40–150)
Anion Gap: 7 mEq/L (ref 3–11)
BUN: 11.2 mg/dL (ref 7.0–26.0)
CO2: 26 mEq/L (ref 22–29)
Calcium: 9.6 mg/dL (ref 8.4–10.4)
Chloride: 108 mEq/L (ref 98–109)
Creatinine: 0.7 mg/dL (ref 0.6–1.1)
Glucose: 231 mg/dl — ABNORMAL HIGH (ref 70–140)
Potassium: 4.4 mEq/L (ref 3.5–5.1)
Sodium: 140 mEq/L (ref 136–145)
Total Bilirubin: 0.7 mg/dL (ref 0.20–1.20)
Total Protein: 6.6 g/dL (ref 6.4–8.3)

## 2013-08-12 LAB — CBC WITH DIFFERENTIAL/PLATELET
BASO%: 0.5 % (ref 0.0–2.0)
Basophils Absolute: 0 10*3/uL (ref 0.0–0.1)
EOS%: 1.6 % (ref 0.0–7.0)
Eosinophils Absolute: 0.1 10*3/uL (ref 0.0–0.5)
HCT: 42 % (ref 34.8–46.6)
HGB: 14.2 g/dL (ref 11.6–15.9)
LYMPH%: 16 % (ref 14.0–49.7)
MCH: 29.2 pg (ref 25.1–34.0)
MCHC: 33.8 g/dL (ref 31.5–36.0)
MCV: 86.6 fL (ref 79.5–101.0)
MONO#: 0.4 10*3/uL (ref 0.1–0.9)
MONO%: 11.8 % (ref 0.0–14.0)
NEUT#: 2.4 10*3/uL (ref 1.5–6.5)
NEUT%: 70.1 % (ref 38.4–76.8)
Platelets: 155 10*3/uL (ref 145–400)
RBC: 4.85 10*6/uL (ref 3.70–5.45)
RDW: 13.4 % (ref 11.2–14.5)
WBC: 3.4 10*3/uL — ABNORMAL LOW (ref 3.9–10.3)
lymph#: 0.5 10*3/uL — ABNORMAL LOW (ref 0.9–3.3)

## 2013-08-12 NOTE — Progress Notes (Signed)
OFFICE PROGRESS NOTE  CC**  Marisa Linger, MD 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   PRIOR THERAPY:  #1in January 2014 patient was found to have a suspicious area on screening mammography in the upper outer quadrant of the left breast. She ultimately underwent biopsy of this area which revealed invasive ductal carcinoma. the tumor was estrogen and progesterone receptor negative as well as HER-2/neu negative MRI revealed a solitary mass measuring 1.6 x 1.5 x 1.5 cm in size. Patient was seen in the multidisciplinary breast clinic.   #2patient is status post left lumpectomy with sentinel lymph node biopsies. Her final pathology reveals a 1.4 cm invasive ductal carcinoma grade 3, ER negative PR negative HER-2/neu negative with Ki-67 54%. Sentinel lymph nodes were negative for metastatic disease.   #3 patient is recommended adjuvant chemotherapy consisting of CMFto be given every 3 weeks for a total of 6 cycles adjuvantly.I have recommended CMF since patient does have significant cardiac disease and recently had a myocardial infarction. She also has other neurologic disease as well.  She started CMF on 02/20/13. She underwent 6 cycles of this and completed therapy on 06/05/13.   #4 patient underwent radiation therapy with Dr. Roselind Messier from 06/23/13-08/08/13  CURRENT THERAPY: Observation  INTERVAL HISTORY: Marisa Forbes 62 y.o. female returns for evaluation of her left breast triple negative invasive ductal carcinoma.  She's doing well today.  She's just completed radiation therapy and tolerated it well.  She is fatigued.  She is on Brilinta, and Dr. Dwain Sarna is waiting to hear from the patient's cardiologist in November.  She has kept up with her port flush appointments.  Her skin is erythematous and peeling, however, its  improving.  She denies fevers, chills, nausea, vomiting, constipation, diarrhea, numbness, night sweats, pain or further concerns.    MEDICAL HISTORY: Past Medical History  Diagnosis Date  . HTN (hypertension)   . Diabetes mellitus without complication   . Stroke, embolic   . Myocardial infarction 09/07/12  . Breast cancer 12/03/12    left    ALLERGIES:  has No Known Allergies.  MEDICATIONS:  Current Outpatient Prescriptions  Medication Sig Dispense Refill  . amLODipine (NORVASC) 2.5 MG tablet Take 1 tablet (2.5 mg total) by mouth daily.  90 tablet  3  . aspirin EC 81 MG EC tablet Take 1 tablet (81 mg total) by mouth daily.      Marland Kitchen atorvastatin (LIPITOR) 80 MG tablet TAKE 1 TABLET BY MOUTH DAILY AT 6:00 PM  30 tablet  7  . diazepam (VALIUM) 5 MG tablet Take 1 tablet (5 mg total) by mouth every 6 (six) hours as needed for anxiety.  60 tablet  2  . glucose blood (ONETOUCH VERIO) test strip Use TID  100 each  12  . hyaluronate sodium (RADIAPLEXRX) GEL Apply topically 2 (two) times daily.      Marland Kitchen ibuprofen (ADVIL,MOTRIN) 200 MG tablet Take 200 mg by mouth every 6 (six) hours as needed. For pain      . insulin aspart (NOVOLOG) 100 UNIT/ML injection Inject 10 Units into the skin 3 (three) times daily with meals.  1 vial  11  . insulin glargine (LANTUS SOLOSTAR) 100 UNIT/ML injection Inject 31 Units into the skin at bedtime.  9 mL  12  . isosorbide mononitrate (IMDUR) 60 MG  24 hr tablet TAKE 1 TABLET BY MOUTH DAILY  30 tablet  6  . lisinopril (PRINIVIL,ZESTRIL) 40 MG tablet Take 1 tablet (40 mg total) by mouth daily.  30 tablet  4  . metFORMIN (GLUCOPHAGE-XR) 750 MG 24 hr tablet Take 1 tablet (750 mg total) by mouth daily with breakfast.  30 tablet  5  . metoprolol (LOPRESSOR) 50 MG tablet Take 2.5 tablets (125 mg total) by mouth 2 (two) times daily.  150 tablet  4  . nitroGLYCERIN (NITROSTAT) 0.4 MG SL tablet Place 0.4 mg under the tongue every 5 (five) minutes as needed for chest pain.      .  non-metallic deodorant Thornton Papas) MISC Apply 1 application topically daily as needed.      . Ticagrelor (BRILINTA) 90 MG TABS tablet Take 1 tablet (90 mg total) by mouth 2 (two) times daily.  60 tablet  6   No current facility-administered medications for this visit.    SURGICAL HISTORY:  Past Surgical History  Procedure Laterality Date  . Coronary angioplasty with stent placement    . Breast lumpectomy with needle localization and axillary sentinel lymph node bx Left 01/08/2013    Procedure: LEFT BREAST WIRE GUIDED  LUMPECTOMY AND LEFT AXILLARY SENTINEL  NODE BX;  Surgeon: Emelia Loron, MD;  Location: [facility] OR;  Service: General;  Laterality: Left;  . Back surgery    . Portacath placement Right 02/17/2013    Procedure: INSERTION PORT-A-CATH;  Surgeon: Emelia Loron, MD;  Location[facility] OR;  Service: General;  Laterality: Right;    REVIEW OF SYSTEMS: A 10 point review of systems was conducted and is otherwise negative except for what is noted above.     PHYSICAL EXAMINATION: Blood pressure 148/87, pulse 63, temperature 97.8 F (36.6 C), temperature source Oral, resp. rate 18, height 5\' 6"  (1.676 m), weight 149 lb 3.2 oz (67.677 kg), SpO2 96.00%. Body mass index is 24.09 kg/(m^2). General: Patient is a well appearing female in no acute distress HEENT: PERRLA, sclerae anicteric no conjunctival pallor, MMM Neck: supple, no palpable adenopathy Lungs: clear to auscultation bilaterally, no wheezes, rhonchi, or rales Cardiovascular: regular rate rhythm, S1, S2, no murmurs, rubs or gallops Abdomen: Soft, non-tender, non-distended, normoactive bowel sounds, no HSM Extremities: warm and well perfused, no clubbing, cyanosis, mild right arm swelling Skin: No rashes or lesions, ecchymosis to right deltoid Neuro: Non-focal Breasts:  Left breast erythema and mild peeling, right breast no masses, nodules, benign breast exam ECOG PERFORMANCE STATUS: 0 - Asymptomatic  LABORATORY DATA: Lab Results   Component Value Date   WBC 3.4* 08/12/2013   HGB 14.2 08/12/2013   HCT 42.0 08/12/2013   MCV 86.6 08/12/2013   PLT 155 08/12/2013      Chemistry      Component Value Date/Time   NA 138 06/26/2013 0845   NA 144 06/12/2013 0827   K 4.0 06/26/2013 0845   K 3.9 06/12/2013 0827   CL 104 06/26/2013 0845   CL 110* 04/10/2013 1020   CO2 28 06/26/2013 0845   CO2 24 06/12/2013 0827   BUN 11 06/26/2013 0845   BUN 9.2 06/12/2013 0827   CREATININE 0.6 06/26/2013 0845   CREATININE 0.6 06/12/2013 0827      Component Value Date/Time   CALCIUM 9.7 06/26/2013 0845   CALCIUM 9.5 06/12/2013 0827   ALKPHOS 91 06/26/2013 0845   ALKPHOS 125 06/12/2013 0827   AST 22 06/26/2013 0845   AST 9 06/12/2013 0827   ALT 28 06/26/2013  0845   ALT 14 06/12/2013 0827   BILITOT 0.7 06/26/2013 0845   BILITOT 0.73 06/12/2013 0827       RADIOGRAPHIC STUDIES:  Dg Chest Port 1 View  02/17/2013  *RADIOLOGY REPORT*  Clinical Data: Port placement  PORTABLE CHEST - 1 VIEW  Comparison: 09/11/2012  Findings: Port has been placed on the right, introduced from the subclavian vein.  The tip is in the SVC 4 cm above the right atrium.  No pneumothorax.  Lungs are clear.  IMPRESSION: Port well positioned with its tip in the SVC 4 cm above the right atrium.  No complication evident.   Original Report Authenticated By: Paulina Fusi, M.D.    Dg Fluoro Guide Cv Line-no Report  02/17/2013  CLINICAL DATA: portacath placement   FLOURO GUIDE CV LINE  Fluoroscopy was utilized by the requesting physician.  No radiographic  interpretation.      ASSESSMENT: 62 year old female with   #1 new diagnosis of invasive ductal carcinoma that is triple negative measuring 1.4 cm node-negative. Patient is status post lumpectomy. Postoperatively she is doing well. Diagnosis code 174.2.  #2 patient and I discussed adjuvant therapy. She was a candidate for adjuvant chemotherapy consisting of CMF given every 3 weeks for a total of 6 cycles. Risks and benefits of  treatment were discussed with her. The dates of her chemotherapy were 4/24, 5/15, 6/5, 6/26, 7/17, 8/7.  She also had appointments 7 days after her chemotherapy for labs and an MD/NP appointment for evaluation of chemotoxicities. She also occasionally required Neulasta injections the day following chemotherapy due to mild neutropenia.  She completed CMF chemotherapy on 06/05/13.  #3 Patient underwent radiation therapy from 06/23/13-08/08/13.    PLAN:   1. Ms. Russman is doing well today.  Her labs are stable.  I reviewed them with her in detail.  She has completed radiation therapy.  She will follow up with Dr. Dwain Sarna about when she can have her port removed.    2. Patient is following up with neurology.  She has had difficulty with dizziness, and now there is a concern that she had a stroke.  Testing and further neurological work up is under way.    3.  Patient will return in 3 months for follow up and evaluation.    All questions were answered. The patient knows to call the clinic with any problems, questions or concerns. We can certainly see the patient much sooner if necessary.  I spent 25 minutes counseling the patient face to face. The total time spent in the appointment was 30 minutes.  Illa Level, NP Medical Oncology [facility] [phone]   08/12/2013, 10:56 AM

## 2013-08-12 NOTE — Telephone Encounter (Signed)
appts made and printed. Pt requested to move her flush appt for 09/08/13 to 09/11/13....td

## 2013-08-12 NOTE — Patient Instructions (Signed)
Doing well.  No sign of recurrence.  We will see you back in 3 months.  Please call us if you have any questions or concerns.

## 2013-08-13 ENCOUNTER — Encounter: Payer: Self-pay | Admitting: Radiation Oncology

## 2013-08-13 NOTE — Progress Notes (Signed)
  Radiation Oncology         [phone] ________________________________  Name: Marisa Forbes MRN: [id number]  Date: 08/13/2013  DOB: [birth year]  End of Treatment Note  Diagnosis:   Stage I invasive ductal carcinoma of the left breast     Indication for treatment:  Breast conservation therapy       Radiation treatment dates:   August 25 through October 10  Site/dose:   Left breast 50.4 gray in 28 fractions, the lumpectomy cavity was boosted to 62.4 gray  Beams/energy:   Tangential beams encompassing the left breast with initial set up, lumpectomy cavity boost was with a 4 field photon beam arrangement in light of the depth within the breast  Narrative: The patient tolerated radiation treatment relatively well.   She had minimal itching and discomfort in the breast area as well as fatigue. She did not experience any moist desquamation.  Plan: The patient has completed radiation treatment. The patient will return to radiation oncology clinic for routine followup in one month. I advised them to call or return sooner if they have any questions or concerns related to their recovery or treatment.  -----------------------------------  Billie Lade, PhD, MD

## 2013-08-20 ENCOUNTER — Ambulatory Visit (INDEPENDENT_AMBULATORY_CARE_PROVIDER_SITE_OTHER): Payer: BC Managed Care – PPO

## 2013-08-20 DIAGNOSIS — I6340 Cerebral infarction due to embolism of unspecified cerebral artery: Secondary | ICD-10-CM

## 2013-08-26 ENCOUNTER — Other Ambulatory Visit (INDEPENDENT_AMBULATORY_CARE_PROVIDER_SITE_OTHER): Payer: Self-pay | Admitting: General Surgery

## 2013-08-28 ENCOUNTER — Telehealth: Payer: Self-pay | Admitting: Internal Medicine

## 2013-08-28 NOTE — Telephone Encounter (Signed)
New problem    Pt's 30 day's will expires  on her sur clearance and need's an updated one faxed to [facility] center.

## 2013-08-28 NOTE — Telephone Encounter (Signed)
Pt called because she has been clear for the cataract surgery, but because the  surgery was postponed to 09/05/13  it would be more than 30 days of the last surgical clearance pt needs to be clear again. Pt is aware that will send this message to Dr. Tenny Craw for reviewing.

## 2013-08-28 NOTE — Telephone Encounter (Signed)
Pt is aware that I have  fax surgical surgical clearance to [facility] att. Myrene Buddy received the [employer]

## 2013-08-28 NOTE — Telephone Encounter (Signed)
Patient is cleared for surgery from cardiac standpoint.

## 2013-09-04 ENCOUNTER — Other Ambulatory Visit: Payer: Self-pay

## 2013-09-04 NOTE — Telephone Encounter (Signed)
Called Marisa Forbes to notify her that I do have surgical orders completed for port removal and I am turning them into our surgery schedulers today to try for the middle of December. I know the Marisa Forbes is supposed to come off the Brilinta the beginning of December and Dr Dwain Sarna advised that he would do the surgery a week after the Marisa Forbes was off the Brilinta. I advised the Marisa Forbes that Dr Dwain Sarna wants to see the Marisa Forbes back in his office before the surgery date. The Marisa Forbes is going to see her cardiologist next week Dr Tenny Craw and the Marisa Forbes is going to confirm about stopping the Brilinta. The Marisa Forbes will call me back after her appt with Dr Tenny Craw to make sure we are all on the same page.

## 2013-09-10 ENCOUNTER — Encounter: Payer: Self-pay | Admitting: Oncology

## 2013-09-11 ENCOUNTER — Ambulatory Visit (HOSPITAL_BASED_OUTPATIENT_CLINIC_OR_DEPARTMENT_OTHER): Payer: BC Managed Care – PPO

## 2013-09-11 ENCOUNTER — Encounter: Payer: Self-pay | Admitting: Radiation Oncology

## 2013-09-11 ENCOUNTER — Ambulatory Visit
Admission: RE | Admit: 2013-09-11 | Discharge: 2013-09-11 | Disposition: A | Discharge status: Home / Self Care | Payer: BC Managed Care – PPO | Source: Ambulatory Visit | Attending: Radiation Oncology | Admitting: Radiation Oncology

## 2013-09-11 VITALS — BP 183/93 | HR 56 | Temp 97.4°F | Ht 66.0 in | Wt 149.7 lb

## 2013-09-11 VITALS — BP 164/83 | HR 56 | Temp 97.0°F

## 2013-09-11 DIAGNOSIS — C50212 Malignant neoplasm of upper-inner quadrant of left female breast: Secondary | ICD-10-CM

## 2013-09-11 DIAGNOSIS — Z452 Encounter for adjustment and management of vascular access device: Secondary | ICD-10-CM

## 2013-09-11 DIAGNOSIS — C50219 Malignant neoplasm of upper-inner quadrant of unspecified female breast: Secondary | ICD-10-CM

## 2013-09-11 DIAGNOSIS — C50919 Malignant neoplasm of unspecified site of unspecified female breast: Secondary | ICD-10-CM

## 2013-09-11 MED ORDER — SODIUM CHLORIDE 0.9 % IJ SOLN
10.0000 mL | INTRAMUSCULAR | Status: DC | PRN
  Administered 2013-09-11: 10 mL via INTRAVENOUS
  Filled 2013-09-11: qty 10

## 2013-09-11 MED ORDER — HEPARIN SOD (PORK) LOCK FLUSH 100 UNIT/ML IV SOLN
500.0000 [IU] | Freq: Once | INTRAVENOUS | Status: AC
  Administered 2013-09-11: 500 [IU] via INTRAVENOUS
  Filled 2013-09-11: qty 5

## 2013-09-11 NOTE — Patient Instructions (Signed)
Implanted Port Instructions  An implanted port is a central line that has a round shape and is placed under the skin. It is used for long-term IV (intravenous) access for:  · Medicine.  · Fluids.  · Liquid nutrition, such as TPN (total parenteral nutrition).  · Blood samples.  Ports can be placed:  · In the chest area just below the collarbone (this is the most common place.)  · In the arms.  · In the belly (abdomen) area.  · In the legs.  PARTS OF THE PORT  A port has 2 main parts:  · The reservoir. The reservoir is round, disc-shaped, and will be a small, raised area under your skin.  · The reservoir is the part where a needle is inserted (accessed) to either give medicines or to draw blood.  · The catheter. The catheter is a long, slender tube that extends from the reservoir. The catheter is placed into a large vein.  · Medicine that is inserted into the reservoir goes into the catheter and then into the vein.  INSERTION OF THE PORT  · The port is surgically placed in either an operating room or in a procedural area (interventional radiology).  · Medicine may be given to help you relax during the procedure.  · The skin where the port will be inserted is numbed (local anesthetic).  · 1 or 2 small cuts (incisions) will be made in the skin to insert the port.  · The port can be used after it has been inserted.  INCISION SITE CARE  · The incision site may have small adhesive strips on it. This helps keep the incision site closed. Sometimes, no adhesive strips are placed. Instead of adhesive strips, a special kind of surgical glue is used to keep the incision closed.  · If adhesive strips were placed on the incision sites, do not take them off. They will fall off on their own.  · The incision site may be sore for 1 to 2 days. Pain medicine can help.  · Do not get the incision site wet. Bathe or shower as directed by your caregiver.  · The incision site should heal in 5 to 7 days. A small scar may form after the  incision has healed.  ACCESSING THE PORT  Special steps must be taken to access the port:  · Before the port is accessed, a numbing cream can be placed on the skin. This helps numb the skin over the port site.  · A sterile technique is used to access the port.  · The port is accessed with a needle. Only "non-coring" port needles should be used to access the port. Once the port is accessed, a blood return should be checked. This helps ensure the port is in the vein and is not clogged (clotted).  · If your caregiver believes your port should remain accessed, a clear (transparent) bandage will be placed over the needle site. The bandage and needle will need to be changed every week or as directed by your caregiver.  · Keep the bandage covering the needle clean and dry. Do not get it wet. Follow your caregiver's instructions on how to take a shower or bath when the port is accessed.  · If your port does not need to stay accessed, no bandage is needed over the port.  FLUSHING THE PORT  Flushing the port keeps it from getting clogged. How often the port is flushed depends on:  · If a   constant infusion is running. If a constant infusion is running, the port may not need to be flushed.  · If intermittent medicines are given.  · If the port is not being used.  For intermittent medicines:  · The port will need to be flushed:  · After medicines have been given.  · After blood has been drawn.  · As part of routine maintenance.  · A port is normally flushed with:  · Normal saline.  · Heparin.  · Follow your caregiver's advice on how often, how much, and the type of flush to use on your port.  IMPORTANT PORT INFORMATION  · Tell your caregiver if you are allergic to heparin.  · After your port is placed, you will get a manufacturer's information card. The card has information about your port. Keep this card with you at all times.  · There are many types of ports available. Know what kind of port you have.  · In case of an  emergency, it may be helpful to wear a medical alert bracelet. This can help alert health care workers that you have a port.  · The port can stay in for as long as your caregiver believes it is necessary.  · When it is time for the port to come out, surgery will be done to remove it. The surgery will be similar to how the port was put in.  · If you are in the hospital or clinic:  · Your port will be taken care of and flushed by a nurse.  · If you are at home:  · A home health care nurse may give medicines and take care of the port.  · You or a family member can get special training and directions for giving medicine and taking care of the port at home.  SEEK IMMEDIATE MEDICAL CARE IF:   · Your port does not flush or you are unable to get a blood return.  · New drainage or pus is coming from the incision.  · A bad smell is coming from the incision site.  · You develop swelling or increased redness at the incision site.  · You develop increased swelling or pain at the port site.  · You develop swelling or pain in the surrounding skin near the port.  · You have an oral temperature above 102° F (38.9° C), not controlled by medicine.  MAKE SURE YOU:   · Understand these instructions.  · Will watch your condition.  · Will get help right away if you are not doing well or get worse.  Document Released: 10/16/2005 Document Revised: 01/08/2012 Document Reviewed: 01/07/2009  [employer]® Patient Information ©2014 [employer].

## 2013-09-11 NOTE — Progress Notes (Signed)
Marisa Forbes here with her husband for follow up after treatment for left breast cancer.  She denies pain but does have soreness in her right shoulder from falling a few days ago.  Her dog tripped her.  She reports fatigue.  Her bp on arrival was elevated at 192/98 and when retaken was 183/93.  She has an appt with her cardiologist tomorrow.  The skin on her left breast has hyperpigmentation and is dry and peeling.  She has not been using radiaplex.

## 2013-09-11 NOTE — Progress Notes (Signed)
Radiation Oncology         [phone] ________________________________  Name: Marisa Forbes MRN: [id number]  Date: 09/11/2013  DOB: January 21, 1951  Follow-Up Visit Note  CC: [name], MD  Victorino December, MD  Diagnosis:   Stage I invasive ductal carcinoma of the left breast    Interval Since Last Radiation:  1  months  Narrative:  The patient returns today for routine follow-up.  She is doing well and without complaints. She denies any pain within the left breast or nipple discharge or bleeding                              ALLERGIES:  has No Known Allergies.  Meds: Current Outpatient Prescriptions  Medication Sig Dispense Refill  . amLODipine (NORVASC) 2.5 MG tablet Take 1 tablet (2.5 mg total) by mouth daily.  90 tablet  3  . aspirin EC 81 MG EC tablet Take 1 tablet (81 mg total) by mouth daily.      Marland Kitchen atorvastatin (LIPITOR) 80 MG tablet TAKE 1 TABLET BY MOUTH DAILY AT 6:00 PM  30 tablet  7  . diazepam (VALIUM) 5 MG tablet Take 1 tablet (5 mg total) by mouth every 6 (six) hours as needed for anxiety.  60 tablet  2  . glucose blood (ONETOUCH VERIO) test strip Use TID  100 each  12  . ibuprofen (ADVIL,MOTRIN) 200 MG tablet Take 200 mg by mouth every 6 (six) hours as needed. For pain      . insulin aspart (NOVOLOG) 100 UNIT/ML injection Inject 10 Units into the skin 3 (three) times daily with meals.  1 vial  11  . insulin glargine (LANTUS SOLOSTAR) 100 UNIT/ML injection Inject 31 Units into the skin at bedtime.  9 mL  12  . isosorbide mononitrate (IMDUR) 60 MG 24 hr tablet TAKE 1 TABLET BY MOUTH DAILY  30 tablet  6  . lisinopril (PRINIVIL,ZESTRIL) 40 MG tablet Take 1 tablet (40 mg total) by mouth daily.  30 tablet  4  . metFORMIN (GLUCOPHAGE-XR) 750 MG 24 hr tablet Take 1 tablet (750 mg total) by mouth daily with breakfast.  30 tablet  5  . metoprolol (LOPRESSOR) 50 MG tablet Take 2.5 tablets (125 mg total) by mouth 2 (two) times daily.  150 tablet  4  . nitroGLYCERIN  (NITROSTAT) 0.4 MG SL tablet Place 0.4 mg under the tongue every 5 (five) minutes as needed for chest pain.      . Ticagrelor (BRILINTA) 90 MG TABS tablet Take 1 tablet (90 mg total) by mouth 2 (two) times daily.  60 tablet  6  . hyaluronate sodium (RADIAPLEXRX) GEL Apply topically 2 (two) times daily.      . non-metallic deodorant Thornton Papas) MISC Apply 1 application topically daily as needed.       No current facility-administered medications for this encounter.   Facility-Administered Medications Ordered in Other Encounters  Medication Dose Route Frequency Provider Last Rate Last Dose  . sodium chloride 0.9 % injection 10 mL  10 mL Intravenous PRN Victorino December, MD   10 mL at 09/11/13 4098    Physical Findings: The patient is in no acute distress. Patient is alert and oriented.  height is 5\' 6"  (1.676 m) and weight is 149 lb 11.2 oz (67.903 kg). Her temperature is 97.4 F (36.3 C). Her blood pressure is 183/93 and her pulse is 56. Marland Kitchen  No palpable  supraclavicular or axillary adenopathy. The lungs are clear to auscultation. The heart has a regular rhythm and rate. Examination of the left breast reveals some hyperpigmentation changes and dry desquamation. Mild edema is noted in the breast in addition. There is no dominant mass appreciated. The patient's lumpectomy scar is well-healed.  Lab Findings: Lab Results  Component Value Date   WBC 3.4* 08/12/2013   HGB 14.2 08/12/2013   HCT 42.0 08/12/2013   MCV 86.6 08/12/2013   PLT 155 08/12/2013      Radiographic Findings: No results found.  Impression:  The patient is recovering from the effects of radiation.    Plan:  I recommended she use a moisturizer for her dry skin along the breast area. Patient will followup on a when necessary basis since she is being seen by Gen. surgery and medical oncology.  _____________________________________  -----------------------------------  Billie Lade, PhD, MD

## 2013-09-12 ENCOUNTER — Encounter: Payer: Self-pay | Admitting: Internal Medicine

## 2013-09-12 ENCOUNTER — Ambulatory Visit (INDEPENDENT_AMBULATORY_CARE_PROVIDER_SITE_OTHER): Payer: BC Managed Care – PPO | Admitting: Internal Medicine

## 2013-09-12 VITALS — BP 193/92 | HR 64 | Ht 66.0 in | Wt 147.0 lb

## 2013-09-12 DIAGNOSIS — I2510 Atherosclerotic heart disease of native coronary artery without angina pectoris: Secondary | ICD-10-CM

## 2013-09-12 MED ORDER — AMLODIPINE BESYLATE 5 MG PO TABS: 5.0000 mg | ORAL_TABLET | Freq: Two times a day (BID) | ORAL | Status: DC

## 2013-09-12 MED ORDER — ISOSORBIDE MONONITRATE ER 60 MG PO TB24: 60.0000 mg | ORAL_TABLET | Freq: Every day | ORAL | Status: DC

## 2013-09-12 MED ORDER — LISINOPRIL 40 MG PO TABS: 40.0000 mg | ORAL_TABLET | Freq: Every day | ORAL | Status: DC

## 2013-09-12 MED ORDER — METOPROLOL TARTRATE 50 MG PO TABS: 125.0000 mg | ORAL_TABLET | Freq: Two times a day (BID) | ORAL | Status: DC

## 2013-09-12 MED ORDER — AMLODIPINE BESYLATE 5 MG PO TABS: 5.0000 mg | ORAL_TABLET | Freq: Every day | ORAL | Status: DC

## 2013-09-12 NOTE — Progress Notes (Addendum)
HPI Patient is a 62 yo who I saw in May  She was previously followed by [facility]  She suffered NSTEMI in November 2013  Cath showed .LM: nl  LAD: 100% occlusion after Dx1 and Septal1 with Timi 0 flow; 80% ostial and Mid Dx; once LAD opened diffuse 80 - 90% mid LAD stenosis in a small vessel  LCX: nl  RCA: 50% mid, 80% distal before PDA takeoff  PTCA/Stent LAD at occluded site with 2.25 x 15 Xience DES stent to 2.30 mm;  PTCA to diffusely diseased mid LAD with long infations up to 2.14 maximally, not stented with TIMI 3 flow returned.  EF: 45 - 50% with mild anterolateral hypokinesis.  20% L renal narrowing  Post procedure the patient hand recurrent pain Went back for second cardiac cath This showed: Stable post PCI anatomy with diffuse distal LAD disease & a suggestion of small dissection distal to tiny D2. Patent LAD stent with TIMI 3 flow in all vessels. Stable D2 lesions - in a 1.5-~2.0 mm vessel, unlikely cause of resting angina --The patient was discharged home on Ranexa She has never filled due to cost.   I saw the patinet earlier this summer.  She deies CP  Breathing is OK   She just had one cataract surgery  Due to have another.   Since the spring the patient has undergone chemoRx for breast CA  ([facility])  She is now about to start radiation therapy.   SHe denies CP No RUQ pain. Breathing is stable  She did have one syncopal spell since seen  The patient says she was sitting on a motorcycle  Became warm, nauseated then passed out  No other symptoms.   No Known Allergies  Current Outpatient Prescriptions  Medication Sig Dispense Refill  . amLODipine (NORVASC) 2.5 MG tablet Take 1 tablet (2.5 mg total) by mouth daily.  90 tablet  3  . aspirin EC 81 MG EC tablet Take 1 tablet (81 mg total) by mouth daily.      Marland Kitchen atorvastatin (LIPITOR) 80 MG tablet TAKE 1 TABLET BY MOUTH DAILY AT 6:00 PM  30 tablet  7  . diazepam (VALIUM) 5 MG tablet Take 1 tablet (5 mg total) by mouth every 6 (six) hours  as needed for anxiety.  60 tablet  2  . glucose blood (ONETOUCH VERIO) test strip Use TID  100 each  12  . hyaluronate sodium (RADIAPLEXRX) GEL Apply topically 2 (two) times daily.      Marland Kitchen ibuprofen (ADVIL,MOTRIN) 200 MG tablet Take 200 mg by mouth every 6 (six) hours as needed. For pain      . insulin aspart (NOVOLOG) 100 UNIT/ML injection Inject 10 Units into the skin 3 (three) times daily with meals.  1 vial  11  . insulin glargine (LANTUS SOLOSTAR) 100 UNIT/ML injection Inject 31 Units into the skin at bedtime.  9 mL  12  . isosorbide mononitrate (IMDUR) 60 MG 24 hr tablet TAKE 1 TABLET BY MOUTH DAILY  30 tablet  6  . lisinopril (PRINIVIL,ZESTRIL) 40 MG tablet Take 1 tablet (40 mg total) by mouth daily.  30 tablet  4  . metFORMIN (GLUCOPHAGE-XR) 750 MG 24 hr tablet Take 1 tablet (750 mg total) by mouth daily with breakfast.  30 tablet  5  . metoprolol (LOPRESSOR) 50 MG tablet Take 2.5 tablets (125 mg total) by mouth 2 (two) times daily.  150 tablet  4  . nitroGLYCERIN (NITROSTAT) 0.4 MG SL tablet  Place 0.4 mg under the tongue every 5 (five) minutes as needed for chest pain.      . non-metallic deodorant Thornton Papas) MISC Apply 1 application topically daily as needed.      . Ticagrelor (BRILINTA) 90 MG TABS tablet Take 1 tablet (90 mg total) by mouth 2 (two) times daily.  60 tablet  6   No current facility-administered medications for this visit.    Past Medical History  Diagnosis Date  . HTN (hypertension)   . Diabetes mellitus without complication   . Stroke, embolic   . Myocardial infarction 09/07/12  . Breast cancer 12/03/12    left  . History of radiation therapy 06/23/2013-08/08/2013    [age] gray to left breast    Past Surgical History  Procedure Laterality Date  . Coronary angioplasty with stent placement    . Breast lumpectomy with needle localization and axillary sentinel lymph node bx Left 01/08/2013    Procedure: LEFT BREAST WIRE GUIDED  LUMPECTOMY AND LEFT AXILLARY SENTINEL  NODE  BX;  Surgeon: Emelia Loron, MD;  Location: [facility] OR;  Service: General;  Laterality: Left;  . Back surgery    . Portacath placement Right 02/17/2013    Procedure: INSERTION PORT-A-CATH;  Surgeon: Emelia Loron, MD;  Location: [facility] OR;  Service: General;  Laterality: Right;    Family History  Problem Relation Age of Onset  . Cancer Neg Hx   . Diabetes Neg Hx   . Hyperlipidemia Neg Hx   . Hypertension Neg Hx   . Kidney disease Neg Hx   . Stroke Neg Hx     History   Social History  . Marital Status: Married    Spouse Name: Vincenza Hews    Number of Children: 2  . Years of Education: college   Occupational History  .     Social History Main Topics  . Smoking status: Never Smoker   . Smokeless tobacco: Never Used  . Alcohol Use: No     Comment: none  . Drug Use: No  . Sexual Activity: Yes   Other Topics Concern  . Not on file   Social History Narrative   Patient is out of work on medical leave and lives at home with husband.    Patient has 2 children and some college education.              Review of Systems:  All systems reviewed.  They are negative to the above problem except as previously stated.  Vital Signs: BP 193/92  Pulse 64  Ht 5\' 6"  (1.676 m)  Wt 147 lb (66.679 kg)  BMI 23.74 kg/m2  Physical Exam Patient is in NAD HEENT:  Normocephalic, atraumatic. EOMI, PERRLA.  Neck: JVP is normal.  No bruits.  Lungs: clear to auscultation. No rales no wheezes.  Heart: Regular rate and rhythm. Normal S1, S2. No S3.   No significant murmurs. PMI not displaced.  Abdomen:  Supple, nontender. Normal bowel sounds. No masses. No hepatomegaly.  Extremities:   Good distal pulses throughout. No lower extremity edema.  Musculoskeletal :moving all extremities.  Neuro:   alert and oriented x3.  CN II-XII grossly intact.  EKG  SR 63.  Anterolateral MI    Inf MI Assessment and Plan:  1.  CAD  Patient without symptoms of angina.  I have reviewed with interventional service   Patinet is now 1 year past stent placement.  Would recomm at 1 yr stopping Brilinta and continuing aspirin.   3.  HTN  BP is high  I would increase amlodipine to 5 bid  Patient will call with BP in 2 wks    4.  HL  Keep on statin.

## 2013-09-12 NOTE — Patient Instructions (Addendum)
Your physician recommends that you schedule a follow-up appointment in: END OF MARCH WITH DR ROSS  INCREASE AMLODIPINE TO 5 MG TWICE DAILY  STOP BRILINTA

## 2013-09-17 LAB — HM DIABETES EYE EXAM: HM Diabetic Eye Exam: NORMAL

## 2013-10-09 ENCOUNTER — Other Ambulatory Visit (HOSPITAL_COMMUNITY): Payer: Self-pay | Admitting: General Surgery

## 2013-10-09 ENCOUNTER — Encounter (HOSPITAL_COMMUNITY): Payer: Self-pay | Admitting: Pharmacy Technician

## 2013-10-09 NOTE — Patient Instructions (Addendum)
20 ZAIA CARRE  10/09/2013   Your procedure is scheduled on: Tuesday December 16th  Report to [facility] at  600 AM.  Call this number if you have problems the morning of surgery [phone]   Remember:   Do not eat food or drink liquids :After Midnight.     Take these medicines the morning of surgery with A SIP OF WATER: take 1/2 dose bedtime lantus insulin 10-13-13, amlodipine, imdur, metoprolol, valium if needed.                                 SEE [place] PREPARING FOR SURGERY SHEET             You may not have any metal on your body including hair pins and piercings  Do not wear jewelry, make-up.  Do not wear lotions, powders, or perfumes. You may wear deodorant.   Men may shave face and neck.  Do not bring valuables to the hospital. [facility] IS NOT RESPONSIBLE FOR VALUEABLES.    Patients discharged the day of surgery will not be allowed to drive home.  Name and phone number of your driver: Vincenza Hews (husband) [phone]    Call Birdie Sons RN pre op nurse if needed [phone]    FAILURE TO FOLLOW THESE INSTRUCTIONS MAY RESULT IN THE CANCELLATION OF YOUR SURGERY.  PATIENT SIGNATURE___________________________________________  NURSE SIGNATURE_____________________________________________

## 2013-10-09 NOTE — Progress Notes (Signed)
Nuclear stress 10-18-12 epic 02-17-13 chest 1 view xray epic 09-12-13 ekg epic 05-18-13 echo epic 05-18-13 carotid doppler epic lov dr Tenny Craw cardiology 09-12-13 epic

## 2013-10-10 ENCOUNTER — Encounter (HOSPITAL_COMMUNITY): Payer: Self-pay

## 2013-10-10 ENCOUNTER — Encounter (HOSPITAL_COMMUNITY)
Admission: RE | Admit: 2013-10-10 | Discharge: 2013-10-10 | Disposition: A | Discharge status: Home / Self Care | Payer: BC Managed Care – PPO | Source: Ambulatory Visit | Attending: General Surgery | Admitting: General Surgery

## 2013-10-10 DIAGNOSIS — C50919 Malignant neoplasm of unspecified site of unspecified female breast: Secondary | ICD-10-CM | POA: Insufficient documentation

## 2013-10-10 DIAGNOSIS — Z01812 Encounter for preprocedural laboratory examination: Secondary | ICD-10-CM | POA: Insufficient documentation

## 2013-10-10 HISTORY — DX: Other abnormalities of gait and mobility: R26.89

## 2013-10-10 HISTORY — DX: Other localized visual field defect, unspecified eye: H53.459

## 2013-10-10 HISTORY — DX: Atherosclerotic heart disease of native coronary artery without angina pectoris: I25.10

## 2013-10-10 HISTORY — DX: Pure hypercholesterolemia, unspecified: E78.00

## 2013-10-10 HISTORY — DX: Personal history of antineoplastic chemotherapy: Z92.21

## 2013-10-10 HISTORY — DX: Gastro-esophageal reflux disease without esophagitis: K21.9

## 2013-10-10 HISTORY — DX: Unspecified osteoarthritis, unspecified site: M19.90

## 2013-10-10 LAB — CBC
HCT: 43.4 % (ref 36.0–46.0)
Hemoglobin: 14.5 g/dL (ref 12.0–15.0)
MCH: 28.5 pg (ref 26.0–34.0)
MCHC: 33.4 g/dL (ref 30.0–36.0)
MCV: 85.4 fL (ref 78.0–100.0)
Platelets: 183 10*3/uL (ref 150–400)
RBC: 5.08 MIL/uL (ref 3.87–5.11)
RDW: 13 % (ref 11.5–15.5)
WBC: 5 10*3/uL (ref 4.0–10.5)

## 2013-10-10 LAB — BASIC METABOLIC PANEL
BUN: 12 mg/dL (ref 6–23)
CO2: 24 mEq/L (ref 19–32)
Calcium: 9.8 mg/dL (ref 8.4–10.5)
Chloride: 102 mEq/L (ref 96–112)
Creatinine, Ser: 0.5 mg/dL (ref 0.50–1.10)
GFR calc Af Amer: >90 mL/min (ref >90)
GFR calc non Af Amer: >90 mL/min (ref >90)
Glucose, Bld: 151 mg/dL — ABNORMAL HIGH (ref 70–99)
Potassium: 3.9 mEq/L (ref 3.5–5.1)
Sodium: 138 mEq/L (ref 135–145)

## 2013-10-14 ENCOUNTER — Encounter (HOSPITAL_COMMUNITY): Payer: Self-pay | Admitting: *Deleted

## 2013-10-14 ENCOUNTER — Encounter (HOSPITAL_COMMUNITY): Payer: BC Managed Care – PPO | Admitting: Anesthesiology

## 2013-10-14 ENCOUNTER — Encounter (HOSPITAL_COMMUNITY)
Admission: RE | Disposition: A | Discharge status: Home / Self Care | Payer: Self-pay | Source: Ambulatory Visit | Attending: General Surgery

## 2013-10-14 ENCOUNTER — Ambulatory Visit (HOSPITAL_COMMUNITY)
Admission: RE | Admit: 2013-10-14 | Discharge: 2013-10-14 | Disposition: A | Discharge status: Home / Self Care | Payer: BC Managed Care – PPO | Source: Ambulatory Visit | Attending: General Surgery | Admitting: General Surgery

## 2013-10-14 ENCOUNTER — Ambulatory Visit (HOSPITAL_COMMUNITY): Payer: BC Managed Care – PPO | Admitting: Anesthesiology

## 2013-10-14 DIAGNOSIS — E7800 Pure hypercholesterolemia, unspecified: Secondary | ICD-10-CM | POA: Insufficient documentation

## 2013-10-14 DIAGNOSIS — K219 Gastro-esophageal reflux disease without esophagitis: Secondary | ICD-10-CM | POA: Insufficient documentation

## 2013-10-14 DIAGNOSIS — Z452 Encounter for adjustment and management of vascular access device: Secondary | ICD-10-CM | POA: Insufficient documentation

## 2013-10-14 DIAGNOSIS — Z794 Long term (current) use of insulin: Secondary | ICD-10-CM | POA: Insufficient documentation

## 2013-10-14 DIAGNOSIS — Z923 Personal history of irradiation: Secondary | ICD-10-CM | POA: Insufficient documentation

## 2013-10-14 DIAGNOSIS — I69998 Other sequelae following unspecified cerebrovascular disease: Secondary | ICD-10-CM | POA: Insufficient documentation

## 2013-10-14 DIAGNOSIS — Z9221 Personal history of antineoplastic chemotherapy: Secondary | ICD-10-CM | POA: Insufficient documentation

## 2013-10-14 DIAGNOSIS — Z7982 Long term (current) use of aspirin: Secondary | ICD-10-CM | POA: Insufficient documentation

## 2013-10-14 DIAGNOSIS — I2510 Atherosclerotic heart disease of native coronary artery without angina pectoris: Secondary | ICD-10-CM | POA: Insufficient documentation

## 2013-10-14 DIAGNOSIS — I739 Peripheral vascular disease, unspecified: Secondary | ICD-10-CM | POA: Insufficient documentation

## 2013-10-14 DIAGNOSIS — R279 Unspecified lack of coordination: Secondary | ICD-10-CM | POA: Insufficient documentation

## 2013-10-14 DIAGNOSIS — E109 Type 1 diabetes mellitus without complications: Secondary | ICD-10-CM | POA: Insufficient documentation

## 2013-10-14 DIAGNOSIS — C50919 Malignant neoplasm of unspecified site of unspecified female breast: Secondary | ICD-10-CM | POA: Insufficient documentation

## 2013-10-14 DIAGNOSIS — Z79899 Other long term (current) drug therapy: Secondary | ICD-10-CM | POA: Insufficient documentation

## 2013-10-14 DIAGNOSIS — I10 Essential (primary) hypertension: Secondary | ICD-10-CM | POA: Insufficient documentation

## 2013-10-14 DIAGNOSIS — I252 Old myocardial infarction: Secondary | ICD-10-CM | POA: Insufficient documentation

## 2013-10-14 HISTORY — PX: PORT-A-CATH REMOVAL: SHX5289

## 2013-10-14 LAB — GLUCOSE, CAPILLARY
Glucose-Capillary: 176 mg/dL — ABNORMAL HIGH (ref 70–99)
Glucose-Capillary: 145 mg/dL — ABNORMAL HIGH (ref 70–99)

## 2013-10-14 SURGERY — REMOVAL PORT-A-CATH
Anesthesia: Monitor Anesthesia Care | Site: Chest

## 2013-10-14 MED ORDER — MIDAZOLAM HCL 5 MG/5ML IJ SOLN
INTRAMUSCULAR | Status: DC | PRN
  Administered 2013-10-14: 2 mg via INTRAVENOUS

## 2013-10-14 MED ORDER — SODIUM CHLORIDE 0.9 % IV SOLN: INTRAVENOUS | Status: DC

## 2013-10-14 MED ORDER — LIDOCAINE HCL (PF) 1 % IJ SOLN
INTRAMUSCULAR | Status: DC | PRN
  Administered 2013-10-14: 5 mL

## 2013-10-14 MED ORDER — MIDAZOLAM HCL 2 MG/2ML IJ SOLN
INTRAMUSCULAR | Status: AC
  Filled 2013-10-14: qty 2

## 2013-10-14 MED ORDER — SODIUM CHLORIDE 0.9 % IJ SOLN: 3.0000 mL | INTRAMUSCULAR | Status: DC | PRN

## 2013-10-14 MED ORDER — ONDANSETRON HCL 4 MG/2ML IJ SOLN
INTRAMUSCULAR | Status: DC | PRN
  Administered 2013-10-14: 4 mg via INTRAVENOUS

## 2013-10-14 MED ORDER — FENTANYL CITRATE 0.05 MG/ML IJ SOLN: 25.0000 ug | INTRAMUSCULAR | Status: DC | PRN

## 2013-10-14 MED ORDER — ONDANSETRON HCL 4 MG/2ML IJ SOLN
INTRAMUSCULAR | Status: AC
  Filled 2013-10-14: qty 2

## 2013-10-14 MED ORDER — PROPOFOL 10 MG/ML IV BOLUS
INTRAVENOUS | Status: DC | PRN
  Administered 2013-10-14: 20 mg via INTRAVENOUS
  Administered 2013-10-14: 30 mg via INTRAVENOUS

## 2013-10-14 MED ORDER — PROPOFOL 10 MG/ML IV BOLUS
INTRAVENOUS | Status: AC
  Filled 2013-10-14: qty 20

## 2013-10-14 MED ORDER — LACTATED RINGERS IV SOLN
INTRAVENOUS | Status: DC | PRN
  Administered 2013-10-14: 08:00:00 via INTRAVENOUS

## 2013-10-14 MED ORDER — ONDANSETRON HCL 4 MG/2ML IJ SOLN: 4.0000 mg | Freq: Four times a day (QID) | INTRAMUSCULAR | Status: DC | PRN

## 2013-10-14 MED ORDER — FENTANYL CITRATE 0.05 MG/ML IJ SOLN
INTRAMUSCULAR | Status: DC | PRN
  Administered 2013-10-14: 50 ug via INTRAVENOUS

## 2013-10-14 MED ORDER — FENTANYL CITRATE 0.05 MG/ML IJ SOLN
INTRAMUSCULAR | Status: AC
  Filled 2013-10-14: qty 2

## 2013-10-14 MED ORDER — MORPHINE SULFATE 10 MG/ML IJ SOLN: 2.0000 mg | INTRAMUSCULAR | Status: DC | PRN

## 2013-10-14 MED ORDER — OXYCODONE HCL 5 MG PO TABS: 5.0000 mg | ORAL_TABLET | ORAL | Status: DC | PRN

## 2013-10-14 MED ORDER — BUPIVACAINE-EPINEPHRINE PF 0.25-1:200000 % IJ SOLN
INTRAMUSCULAR | Status: AC
  Filled 2013-10-14: qty 30

## 2013-10-14 MED ORDER — ACETAMINOPHEN 325 MG PO TABS: 650.0000 mg | ORAL_TABLET | ORAL | Status: DC | PRN

## 2013-10-14 MED ORDER — BUPIVACAINE-EPINEPHRINE 0.25% -1:200000 IJ SOLN
INTRAMUSCULAR | Status: DC | PRN
  Administered 2013-10-14: 5 mL

## 2013-10-14 MED ORDER — HYDROCODONE-ACETAMINOPHEN 5-325 MG PO TABS: 1.0000 | ORAL_TABLET | ORAL | Status: DC | PRN

## 2013-10-14 MED ORDER — SODIUM CHLORIDE 0.9 % IJ SOLN: 3.0000 mL | Freq: Two times a day (BID) | INTRAMUSCULAR | Status: DC

## 2013-10-14 MED ORDER — LIDOCAINE HCL 1 % IJ SOLN
INTRAMUSCULAR | Status: AC
  Filled 2013-10-14: qty 20

## 2013-10-14 MED ORDER — ACETAMINOPHEN 650 MG RE SUPP
650.0000 mg | RECTAL | Status: DC | PRN
  Filled 2013-10-14: qty 1

## 2013-10-14 MED ORDER — SODIUM CHLORIDE 0.9 % IV SOLN: 250.0000 mL | INTRAVENOUS | Status: DC | PRN

## 2013-10-14 MED ORDER — 0.9 % SODIUM CHLORIDE (POUR BTL) OPTIME
TOPICAL | Status: DC | PRN
  Administered 2013-10-14: 1000 mL

## 2013-10-14 SURGICAL SUPPLY — 31 items
ADH SKN CLS APL DERMABOND .7 (GAUZE/BANDAGES/DRESSINGS) ×1
BLADE HEX COATED 2.75 (ELECTRODE) ×2 IMPLANT
BLADE SURG 15 STRL LF DISP TIS (BLADE) ×1 IMPLANT
BLADE SURG 15 STRL SS (BLADE) ×2
CANISTER SUCTION 2500CC (MISCELLANEOUS) ×2 IMPLANT
DECANTER SPIKE VIAL GLASS SM (MISCELLANEOUS) ×2 IMPLANT
DERMABOND ADVANCED (GAUZE/BANDAGES/DRESSINGS) ×1
DERMABOND ADVANCED .7 DNX12 (GAUZE/BANDAGES/DRESSINGS) ×1 IMPLANT
DRAPE LAPAROTOMY TRNSV 102X78 (DRAPE) ×2 IMPLANT
ELECT REM PT RETURN 9FT ADLT (ELECTROSURGICAL) ×2
ELECTRODE REM PT RTRN 9FT ADLT (ELECTROSURGICAL) ×1 IMPLANT
GLOVE BIO SURGEON STRL SZ7 (GLOVE) ×2 IMPLANT
GLOVE BIOGEL PI IND STRL 7.5 (GLOVE) ×1 IMPLANT
GLOVE BIOGEL PI INDICATOR 7.5 (GLOVE) ×1
GOWN STRL NON-REIN LRG LVL3 (GOWN DISPOSABLE) ×2 IMPLANT
GOWN STRL REIN XL XLG (GOWN DISPOSABLE) ×3 IMPLANT
KIT BASIN OR (CUSTOM PROCEDURE TRAY) ×2 IMPLANT
NDL HYPO 25X1 1.5 SAFETY (NEEDLE) ×1 IMPLANT
NEEDLE HYPO 25X1 1.5 SAFETY (NEEDLE) ×2 IMPLANT
PACK BASIC VI WITH GOWN DISP (CUSTOM PROCEDURE TRAY) ×2 IMPLANT
PENCIL BUTTON HOLSTER BLD 10FT (ELECTRODE) ×2 IMPLANT
SPONGE GAUZE 4X4 12PLY (GAUZE/BANDAGES/DRESSINGS) IMPLANT
SPONGE LAP 4X18 X RAY DECT (DISPOSABLE) ×2 IMPLANT
SUT MNCRL AB 4-0 PS2 18 (SUTURE) ×2 IMPLANT
SUT VIC AB 3-0 SH 27 (SUTURE) ×2
SUT VIC AB 3-0 SH 27XBRD (SUTURE) ×1 IMPLANT
SYR 20CC LL (SYRINGE) ×2 IMPLANT
SYR BULB IRRIGATION 50ML (SYRINGE) IMPLANT
TOWEL OR 17X26 10 PK STRL BLUE (TOWEL DISPOSABLE) ×2 IMPLANT
TOWEL OR NON WOVEN STRL DISP B (DISPOSABLE) ×2 IMPLANT
YANKAUER SUCT BULB TIP 10FT TU (MISCELLANEOUS) IMPLANT

## 2013-10-14 NOTE — Transfer of Care (Signed)
Immediate Anesthesia Transfer of Care Note  Patient: Marisa Forbes  Procedure(s) Performed: Procedure(s): REMOVAL PORT-A-CATH (N/A)  Patient Location: PACU  Anesthesia Type:MAC  Level of Consciousness: awake, alert , oriented and patient cooperative  Airway & Oxygen Therapy: Patient Spontanous Breathing and Patient connected to face mask oxygen  Post-op Assessment: Report given to PACU RN and Post -op Vital signs reviewed and stable  Post vital signs: Reviewed and stable  Complications: No apparent anesthesia complications

## 2013-10-14 NOTE — Anesthesia Postprocedure Evaluation (Signed)
  Anesthesia Post-op Note  Patient: Marisa Forbes  Procedure(s) Performed: Procedure(s) (LRB): REMOVAL PORT-A-CATH (N/A)  Patient Location: PACU  Anesthesia Type: MAC  Level of Consciousness: awake and alert   Airway and Oxygen Therapy: Patient Spontanous Breathing  Post-op Pain: mild  Post-op Assessment: Post-op Vital signs reviewed, Patient's Cardiovascular Status Stable, Respiratory Function Stable, Patent Airway and No signs of Nausea or vomiting  Last Vitals:  Filed Vitals:   10/14/13 0930  BP: 122/68  Pulse: 55  Temp: 36.4 C  Resp:     Post-op Vital Signs: stable   Complications: No apparent anesthesia complications

## 2013-10-14 NOTE — Anesthesia Preprocedure Evaluation (Signed)
Anesthesia Evaluation  Patient identified by MRN, date of birth, ID band Patient awake    Reviewed: Allergy & Precautions, H&P , NPO status , Patient's Chart, lab work & pertinent test results  Airway Mallampati: II TM Distance: >3 FB Neck ROM: Full    Dental no notable dental hx.    Pulmonary neg pulmonary ROS,  breath sounds clear to auscultation  Pulmonary exam normal       Cardiovascular hypertension, Pt. on medications + CAD, + Past MI and + Peripheral Vascular Disease negative cardio ROS  + dysrhythmias Rhythm:Regular Rate:Normal     Neuro/Psych  Neuromuscular disease CVA negative psych ROS   GI/Hepatic negative GI ROS, Neg liver ROS, GERD-  ,  Endo/Other  diabetes, Type 1, Insulin Dependent  Renal/GU negative Renal ROS  negative genitourinary   Musculoskeletal negative musculoskeletal ROS (+)   Abdominal   Peds negative pediatric ROS (+)  Hematology negative hematology ROS (+)   Anesthesia Other Findings   Reproductive/Obstetrics negative OB ROS                           Anesthesia Physical Anesthesia Plan  ASA: III  Anesthesia Plan: MAC   Post-op Pain Management:    Induction: Intravenous  Airway Management Planned:   Additional Equipment:   Intra-op Plan:   Post-operative Plan:   Informed Consent: I have reviewed the patients History and Physical, chart, labs and discussed the procedure including the risks, benefits and alternatives for the proposed anesthesia with the patient or authorized representative who has indicated his/her understanding and acceptance.   Dental advisory given  Plan Discussed with: CRNA  Anesthesia Plan Comments:         Anesthesia Quick Evaluation

## 2013-10-14 NOTE — H&P (Signed)
Marisa Forbes is an 62 y.o. female.   Chief Complaint: no longer needs port HPI: 56 yof who had DES placed s/p MI in November 2013 underwent left lumpectomy/snbx through integrilin window. She then had a port placed. She has undergone chemotherapy and xrt now. . She reports no complatints with either breast. She would like to have port removed.  She is now off brilinta.  Otherwise is doing well   Objective:      Past Medical History  Diagnosis Date  . HTN (hypertension)   . Diabetes mellitus without complication   . Myocardial infarction 09/07/12  . Breast cancer 12/03/12    left  . History of radiation therapy 06/23/2013-08/08/2013    62.4 gray to left breast  . Hypercholesteremia   . Coronary artery disease   . Stroke, embolic July 2014  . Peripheral vision loss     left  . Balance problems     due to stroke  . History of cancer chemotherapy     last in August 2014  . GERD (gastroesophageal reflux disease)     food related  . Arthritis     back    Past Surgical History  Procedure Laterality Date  . Coronary angioplasty with stent placement    . Breast lumpectomy with needle localization and axillary sentinel lymph node bx Left 01/08/2013    Procedure: LEFT BREAST WIRE GUIDED  LUMPECTOMY AND LEFT AXILLARY SENTINEL  NODE BX;  Surgeon: Emelia Loron, MD;  Location: [facility] OR;  Service: General;  Laterality: Left;  . Portacath placement Right 02/17/2013    Procedure: INSERTION PORT-A-CATH;  Surgeon: Emelia Loron, MD;  Location: [facility] OR;  Service: General;  Laterality: Right;  . Back surgery      x3, lower back  . Cataract extraction Bilateral     Family History  Problem Relation Age of Onset  . Cancer Neg Hx   . Diabetes Neg Hx   . Hyperlipidemia Neg Hx   . Hypertension Neg Hx   . Kidney disease Neg Hx   . Stroke Neg Hx    Social History:  reports that she has never smoked. She has never used smokeless tobacco. She reports that she does not drink alcohol or use  illicit drugs.  Allergies: No Known Allergies  Medications Prior to Admission  Medication Sig Dispense Refill  . amLODipine (NORVASC) 5 MG tablet Take 5 mg by mouth 2 (two) times daily.      Marland Kitchen aspirin EC 81 MG EC tablet Take 1 tablet (81 mg total) by mouth daily.      Marland Kitchen atorvastatin (LIPITOR) 80 MG tablet Take 80 mg by mouth daily with supper.      . diazepam (VALIUM) 5 MG tablet Take 5 mg by mouth every 6 (six) hours as needed for anxiety.      Marland Kitchen glucose blood (ONETOUCH VERIO) test strip Use TID  100 each  12  . ibuprofen (ADVIL,MOTRIN) 200 MG tablet Take 200 mg by mouth every 6 (six) hours as needed for mild pain or moderate pain. For pain      . insulin aspart (NOVOLOG) 100 UNIT/ML injection Inject 10 Units into the skin 3 (three) times daily with meals as needed for high blood sugar.      . insulin glargine (LANTUS SOLOSTAR) 100 UNIT/ML injection Inject 31 Units into the skin at bedtime.  9 mL  12  . isosorbide mononitrate (IMDUR) 60 MG 24 hr tablet Take 60 mg by mouth daily.      Marland Kitchen  lisinopril (PRINIVIL,ZESTRIL) 40 MG tablet Take 40 mg by mouth daily with breakfast.      . metFORMIN (GLUCOPHAGE-XR) 750 MG 24 hr tablet Take 1 tablet (750 mg total) by mouth daily with breakfast.  30 tablet  5  . metoprolol (LOPRESSOR) 50 MG tablet Take 125 mg by mouth 2 (two) times daily.      . nitroGLYCERIN (NITROSTAT) 0.4 MG SL tablet Place 0.4 mg under the tongue every 5 (five) minutes as needed for chest pain.        Results for orders placed during the hospital encounter of 10/14/13 (from the past 48 hour(s))  GLUCOSE, CAPILLARY     Status: Abnormal   Collection Time    10/14/13  6:47 AM      Result Value Range   Glucose-Capillary 176 (*) 70 - 99 mg/dL   No results found.  ROS  Blood pressure 115/82, pulse 64, temperature 98 F (36.7 C), temperature source Oral, resp. rate 16, SpO2 97.00%. Physical Exam  Physical Exam  left breast without mass, incisions healed,  Right breast without mass   No cervical/[place]/axillary lad  cv RRR  Lungs clear Assessment/Plan No longer needs venous access Breast cancer  Plan for port removal  Sheniqua Carolan 10/14/2013, 8:15 AM

## 2013-10-14 NOTE — Op Note (Signed)
Preoperative diagnosis: Breast cancer no longer needs venous access Postoperative diagnosis: Same as above Procedure: Right subclavian port removal Surgeon: Dr. Harden Mo Anesthesia: Local with monitored anesthesia care Estimated blood loss: Minimal Specimens: None Drains: None Complications: None Sponge count correct at completion Disposition to recovery stable  Indications: This is a 62 year old female who I took care for breast cancer and no longer needs venous access. We discussed port removal.  Procedure: After informed consent was obtained the patient was taken to the operating room. She was placed under monitored anesthesia care. She had sequential compression devices on her legs. Her right chest was prepped and draped in the standard sterile surgical fashion. A surgical timeout was then performed.  I infiltrated a mixture of 1% lidocaine and quarter percent Marcaine in her old scar. I then made an incision. I removed her port as well as the line in their entirety. I removed all of the stitch material from the pocket. The pocket was hemostatic. I then closed with 3-0 Vicryl, 4-0 Monocryl, and Dermabond. She tolerated this well and was transferred to recovery stable

## 2013-10-15 ENCOUNTER — Encounter (HOSPITAL_COMMUNITY): Payer: Self-pay | Admitting: General Surgery

## 2013-10-16 ENCOUNTER — Telehealth: Payer: Self-pay | Admitting: *Deleted

## 2013-10-16 NOTE — Telephone Encounter (Signed)
Per patient voicemail, I have canceled her flush appt. Patient stated that she had her port d/c on Tuesday

## 2013-10-20 ENCOUNTER — Telehealth (INDEPENDENT_AMBULATORY_CARE_PROVIDER_SITE_OTHER): Payer: Self-pay | Admitting: *Deleted

## 2013-10-20 NOTE — Telephone Encounter (Signed)
I spoke with pt to check on her postoperatively.  She states she is doing well, has no complaints.  I informed her of her postop appt with Dr. Dwain Sarna on 11/13/13 @ 3:30pm.  I instructed her to call our office with any questions or concerns.  She is agreeable with this plan.

## 2013-11-13 ENCOUNTER — Encounter (INDEPENDENT_AMBULATORY_CARE_PROVIDER_SITE_OTHER): Payer: Self-pay | Admitting: General Surgery

## 2013-11-13 ENCOUNTER — Ambulatory Visit (INDEPENDENT_AMBULATORY_CARE_PROVIDER_SITE_OTHER): Payer: 59 | Admitting: General Surgery

## 2013-11-13 ENCOUNTER — Other Ambulatory Visit (HOSPITAL_BASED_OUTPATIENT_CLINIC_OR_DEPARTMENT_OTHER): Payer: 59

## 2013-11-13 ENCOUNTER — Other Ambulatory Visit: Payer: Self-pay | Admitting: Internal Medicine

## 2013-11-13 ENCOUNTER — Ambulatory Visit (HOSPITAL_BASED_OUTPATIENT_CLINIC_OR_DEPARTMENT_OTHER): Payer: 59 | Admitting: Adult Health

## 2013-11-13 ENCOUNTER — Encounter: Payer: Self-pay | Admitting: Adult Health

## 2013-11-13 ENCOUNTER — Telehealth: Payer: Self-pay | Admitting: Oncology

## 2013-11-13 ENCOUNTER — Telehealth: Payer: Self-pay | Admitting: *Deleted

## 2013-11-13 ENCOUNTER — Encounter (INDEPENDENT_AMBULATORY_CARE_PROVIDER_SITE_OTHER): Payer: BC Managed Care – PPO | Admitting: General Surgery

## 2013-11-13 VITALS — BP 184/88 | HR 67 | Temp 98.7°F | Resp 18 | Ht 65.0 in | Wt 151.4 lb

## 2013-11-13 VITALS — BP 158/90 | HR 71 | Temp 97.1°F | Resp 16 | Ht 65.0 in | Wt 150.6 lb

## 2013-11-13 DIAGNOSIS — E119 Type 2 diabetes mellitus without complications: Secondary | ICD-10-CM

## 2013-11-13 DIAGNOSIS — C50219 Malignant neoplasm of upper-inner quadrant of unspecified female breast: Secondary | ICD-10-CM

## 2013-11-13 DIAGNOSIS — C50919 Malignant neoplasm of unspecified site of unspecified female breast: Secondary | ICD-10-CM

## 2013-11-13 DIAGNOSIS — I10 Essential (primary) hypertension: Secondary | ICD-10-CM

## 2013-11-13 DIAGNOSIS — Z853 Personal history of malignant neoplasm of breast: Secondary | ICD-10-CM

## 2013-11-13 DIAGNOSIS — Z09 Encounter for follow-up examination after completed treatment for conditions other than malignant neoplasm: Secondary | ICD-10-CM

## 2013-11-13 DIAGNOSIS — Z171 Estrogen receptor negative status [ER-]: Secondary | ICD-10-CM

## 2013-11-13 DIAGNOSIS — Z9889 Other specified postprocedural states: Secondary | ICD-10-CM

## 2013-11-13 LAB — COMPREHENSIVE METABOLIC PANEL (CC13)
ALT: 27 U/L (ref 0–55)
AST: 16 U/L (ref 5–34)
Albumin: 4.1 g/dL (ref 3.5–5.0)
Alkaline Phosphatase: 110 U/L (ref 40–150)
Anion Gap: 10 mEq/L (ref 3–11)
BUN: 14.1 mg/dL (ref 7.0–26.0)
CO2: 26 mEq/L (ref 22–29)
Calcium: 10.1 mg/dL (ref 8.4–10.4)
Chloride: 102 mEq/L (ref 98–109)
Creatinine: 0.9 mg/dL (ref 0.6–1.1)
Glucose: 457 mg/dl — ABNORMAL HIGH (ref 70–140)
Potassium: 4.2 mEq/L (ref 3.5–5.1)
Sodium: 138 mEq/L (ref 136–145)
Total Bilirubin: 0.66 mg/dL (ref 0.20–1.20)
Total Protein: 7.3 g/dL (ref 6.4–8.3)

## 2013-11-13 LAB — CBC WITH DIFFERENTIAL/PLATELET
BASO%: 0.4 % (ref 0.0–2.0)
Basophils Absolute: 0 10*3/uL (ref 0.0–0.1)
EOS%: 1.2 % (ref 0.0–7.0)
Eosinophils Absolute: 0.1 10*3/uL (ref 0.0–0.5)
HCT: 48.1 % — ABNORMAL HIGH (ref 34.8–46.6)
HGB: 16.1 g/dL — ABNORMAL HIGH (ref 11.6–15.9)
LYMPH%: 14 % (ref 14.0–49.7)
MCH: 29.3 pg (ref 25.1–34.0)
MCHC: 33.5 g/dL (ref 31.5–36.0)
MCV: 87.3 fL (ref 79.5–101.0)
MONO#: 0.4 10*3/uL (ref 0.1–0.9)
MONO%: 6 % (ref 0.0–14.0)
NEUT#: 5.8 10*3/uL (ref 1.5–6.5)
NEUT%: 78.4 % — ABNORMAL HIGH (ref 38.4–76.8)
Platelets: 181 10*3/uL (ref 145–400)
RBC: 5.51 10*6/uL — ABNORMAL HIGH (ref 3.70–5.45)
RDW: 14 % (ref 11.2–14.5)
WBC: 7.3 10*3/uL (ref 3.9–10.3)
lymph#: 1 10*3/uL (ref 0.9–3.3)

## 2013-11-13 MED ORDER — INSULIN ASPART 100 UNIT/ML ~~LOC~~ SOLN
10.0000 [IU] | Freq: Once | SUBCUTANEOUS | Status: DC
  Filled 2013-11-13: qty 0.1

## 2013-11-13 NOTE — Progress Notes (Signed)
OFFICE PROGRESS NOTE  CC**  Marisa Calico, MD [address] 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   PRIOR THERAPY:  #1in January 2014 patient was found to have a suspicious area on screening mammography in the upper outer quadrant of the left breast. She ultimately underwent biopsy of this area which revealed invasive ductal carcinoma. the tumor was estrogen and progesterone receptor negative as well as HER-2/neu negative MRI revealed a solitary mass measuring 1.6 x 1.5 x 1.5 cm in size. Patient was seen in the multidisciplinary breast clinic.   #2patient is status post left lumpectomy with sentinel lymph node biopsies. Her final pathology reveals a 1.4 cm invasive ductal carcinoma grade 3, ER negative PR negative HER-2/neu negative with Ki-67 54%. Sentinel lymph nodes were negative for metastatic disease.   #3 s/p adjuvant chemotherapy consisting of CMF.  She started CMF on 02/20/13. She underwent 6 cycles of this and completed therapy on 06/05/13.   #4 patient underwent radiation therapy with Dr. Sondra Come from 06/23/13-08/08/13  CURRENT THERAPY: Observation  INTERVAL HISTORY: Marisa Forbes 63 y.o. female returns for evaluation of her left breast triple negative invasive ductal carcinoma.  She continues to do well.  Dr. Donne Hazel has recently removed her port without difficulty.  She is performing self breast exams, and has noticed no breast changes, or concerns in her breasts.  Her blood glucose today is 457.  She forgot to take her insulin and antihypertensives today.  She denies any new pain, unintentional weight loss, night sweats, fevers, chills, shortness of breath, headaches, weakness, or further concerns.  A 10 point ROS is neg.   MEDICAL HISTORY: Past Medical History  Diagnosis Date  . HTN (hypertension)   . Diabetes mellitus  without complication   . Myocardial infarction 09/07/12  . Breast cancer 12/03/12    left  . History of radiation therapy 06/23/2013-08/08/2013    62.4 gray to left breast  . Hypercholesteremia   . Coronary artery disease   . Stroke, embolic July 6045  . Peripheral vision loss     left  . Balance problems     due to stroke  . History of cancer chemotherapy     last in August 2014  . GERD (gastroesophageal reflux disease)     food related  . Arthritis     back    ALLERGIES:  has No Known Allergies.  MEDICATIONS:  Current Outpatient Prescriptions  Medication Sig Dispense Refill  . amLODipine (NORVASC) 5 MG tablet Take 5 mg by mouth 2 (two) times daily.      Marland Kitchen aspirin EC 81 MG EC tablet Take 1 tablet (81 mg total) by mouth daily.      Marland Kitchen atorvastatin (LIPITOR) 80 MG tablet Take 80 mg by mouth daily with supper.      . diazepam (VALIUM) 5 MG tablet Take 5 mg by mouth every 6 (six) hours as needed for anxiety.      Marland Kitchen glucose blood (ONETOUCH VERIO) test strip Use TID  100 each  12  . HYDROcodone-acetaminophen (NORCO/VICODIN) 5-325 MG per tablet Take 1 tablet by mouth every 4 (four) hours as needed.  10 tablet  0  . ibuprofen (ADVIL,MOTRIN) 200 MG tablet Take 200 mg by mouth every 6 (six) hours as needed for mild pain or moderate pain. For pain      .  isosorbide mononitrate (IMDUR) 60 MG 24 hr tablet Take 60 mg by mouth daily.      Marland Kitchen lisinopril (PRINIVIL,ZESTRIL) 40 MG tablet Take 40 mg by mouth daily with breakfast.      . metoprolol (LOPRESSOR) 50 MG tablet Take 125 mg by mouth 2 (two) times daily.      . insulin aspart (NOVOLOG) 100 UNIT/ML injection Inject 10 Units into the skin 3 (three) times daily with meals.  10 mL  11  . insulin glargine (LANTUS) 100 UNIT/ML injection Inject 0.31 mLs (31 Units total) into the skin at bedtime.  9 mL  12  . metFORMIN (GLUCOPHAGE-XR) 750 MG 24 hr tablet Take 1 tablet (750 mg total) by mouth daily with breakfast.  90 tablet  1  . nitroGLYCERIN  (NITROSTAT) 0.4 MG SL tablet Place 0.4 mg under the tongue every 5 (five) minutes as needed for chest pain.       Current Facility-Administered Medications  Medication Dose Route Frequency Provider Last Rate Last Dose  . insulin aspart (novoLOG) injection 10 Units  10 Units Subcutaneous Once Minette Headland, NP        SURGICAL HISTORY:  Past Surgical History  Procedure Laterality Date  . Coronary angioplasty with stent placement    . Breast lumpectomy with needle localization and axillary sentinel lymph node bx Left 01/08/2013    Procedure: LEFT BREAST WIRE GUIDED  LUMPECTOMY AND LEFT AXILLARY SENTINEL  NODE BX;  Surgeon: Rolm Bookbinder, MD;  Location: [facility];  Service: General;  Laterality: Left;  . Portacath placement Right 02/17/2013    Procedure: INSERTION PORT-A-CATH;  Surgeon: Rolm Bookbinder, MD;  Location: [facility];  Service: General;  Laterality: Right;  . Back surgery      x3, lower back  . Cataract extraction Bilateral   . Port-a-cath removal N/A 10/14/2013    Procedure: REMOVAL PORT-A-CATH;  Surgeon: Rolm Bookbinder, MD;  Location: [facility];  Service: General;  Laterality: N/A;    REVIEW OF SYSTEMS: A 10 point review of systems was conducted and is otherwise negative except for what is noted above.     PHYSICAL EXAMINATION: Blood pressure 184/88, pulse 67, temperature 98.7 F (37.1 C), temperature source Oral, resp. rate 18, height 5' 5"  (1.651 m), weight 151 lb 6.4 oz (68.675 kg). Body mass index is 25.19 kg/(m^2). GENERAL: Patient is a well appearing female in no acute distress HEENT:  Sclerae anicteric.  Oropharynx clear and moist. No ulcerations or evidence of oropharyngeal candidiasis. Neck is supple.  NODES:  No cervical, supraclavicular, or axillary lymphadenopathy palpated.  BREAST EXAM:  Deferred. LUNGS:  Clear to auscultation bilaterally.  No wheezes or rhonchi. HEART:  Regular rate and rhythm. No murmur appreciated. ABDOMEN:  Soft, nontender.  Positive,  normoactive bowel sounds. No organomegaly palpated. MSK:  No focal spinal tenderness to palpation. Full range of motion bilaterally in the upper extremities. EXTREMITIES:  No peripheral edema.   SKIN:  Clear with no obvious rashes or skin changes. No nail dyscrasia. NEURO:  Nonfocal. Well oriented.  Appropriate affect. ECOG PERFORMANCE STATUS: 0 - Asymptomatic  LABORATORY DATA: Lab Results  Component Value Date   WBC 7.3 11/13/2013   HGB 16.1* 11/13/2013   HCT 48.1* 11/13/2013   MCV 87.3 11/13/2013   PLT 181 11/13/2013      Chemistry      Component Value Date/Time   NA 138 11/13/2013 1015   NA 138 10/10/2013 1100   K 4.2 11/13/2013 1015   K 3.9 10/10/2013  1100   CL 102 10/10/2013 1100   CL 110* 04/10/2013 1020   CO2 26 11/13/2013 1015   CO2 24 10/10/2013 1100   BUN 14.1 11/13/2013 1015   BUN 12 10/10/2013 1100   CREATININE 0.9 11/13/2013 1015   CREATININE 0.50 10/10/2013 1100      Component Value Date/Time   CALCIUM 10.1 11/13/2013 1015   CALCIUM 9.8 10/10/2013 1100   ALKPHOS 110 11/13/2013 1015   ALKPHOS 91 06/26/2013 0845   AST 16 11/13/2013 1015   AST 22 06/26/2013 0845   ALT 27 11/13/2013 1015   ALT 28 06/26/2013 0845   BILITOT 0.66 11/13/2013 1015   BILITOT 0.7 06/26/2013 0845       RADIOGRAPHIC STUDIES:  Dg Chest Port 1 View  02/17/2013  *RADIOLOGY REPORT*  Clinical Data: Port placement  PORTABLE CHEST - 1 VIEW  Comparison: 09/11/2012  Findings: Port has been placed on the right, introduced from the subclavian vein.  The tip is in the SVC 4 cm above the right atrium.  No pneumothorax.  Lungs are clear.  IMPRESSION: Port well positioned with its tip in the SVC 4 cm above the right atrium.  No complication evident.   Original Report Authenticated By: Nelson Chimes, M.D.    Dg Fluoro Guide Cv Line-no Report  02/17/2013  CLINICAL DATA: portacath placement   FLOURO GUIDE CV LINE  Fluoroscopy was utilized by the requesting physician.  No radiographic  interpretation.       ASSESSMENT: 63 year old female with   #1 new diagnosis of invasive ductal carcinoma that is triple negative measuring 1.4 cm node-negative. Patient is status post lumpectomy. Postoperatively she is doing well. Diagnosis code 174.2.  #2 S/p CMF given every 3 weeks for a total of 6 cycles. Risks and benefits of treatment were discussed with her. The dates of her chemotherapy were 4/24, 5/15, 6/5, 6/26, 7/17, 8/7.  She also had appointments 7 days after her chemotherapy for labs and an MD/NP appointment for evaluation of chemotoxicities. She also occasionally required Neulasta injections the day following chemotherapy due to mild neutropenia.  She completed CMF chemotherapy on 06/05/13.  #3 Patient underwent radiation therapy from 06/23/13-08/08/13.    PLAN:   1. Patient is doing well today.  She has no sign of recurrence.  2.  I counseled the patient on survivorship, healthy diet, exercise, breast exams, annual mammography.  We specifically discussed her co morbidities including her diabetes and hypertension.  We notified her PCP of the elevated blood sugar and gave the patient 10 units of Novolog.  I counseled her that if she felt weak or faint to go to her nearest ER.  I encouraged her not to miss any more of her blood pressure medications or insulin.  She will f/u with Dr. Ronnald Ramp tomorrow.    3.  We will see her back in 3 months for labs and evaluation.    All questions were answered. The patient knows to call the clinic with any problems, questions or concerns. We can certainly see the patient much sooner if necessary.  I spent 25 minutes counseling the patient face to face. The total time spent in the appointment was 30 minutes.  Minette Headland, [place] [phone] 11/15/2013, 9:27 AM

## 2013-11-13 NOTE — Progress Notes (Signed)
Pt came in today for doctor's visit. Pt labs revealed a blood glucose of 457. Pt said she ate breakfast and forgot to take her insulin this am. She normally takes Novalog 10 units. This nurse called PCP to make them aware of her blood glucose. The doctor ordered to give 10 units of Novalog. I gave pt Novalog 10 units SQ in abdomen and told her to check blood sugar when she gets home per Charlestine Massed, NP.

## 2013-11-13 NOTE — Progress Notes (Signed)
Subjective:     Patient ID: Marisa Forbes, female   DOB: Apr 10, 1951, 63 y.o.   MRN: [id number]  HPI 63 year old female status post treatment for breast cancer who I recently took report out after she completed treatment. She is without complaint today returns doing very well.  Review of Systems     Objective:   Physical Exam Healed right chest incision s/p port removal, no infection    Assessment:     History of breast cancer status post port removal     Plan:     She can return to full activity. She is following up with the cancer center at appropriate intervals and this plan on seeing her back in about 6-8 months. She knows to call me sooner she has any questions or problems.

## 2013-11-13 NOTE — Telephone Encounter (Signed)
, °

## 2013-11-13 NOTE — Telephone Encounter (Signed)
Nurse from [facility] phoned stating her BG's are elevated.  Ate breakfast, didn't take her breakfast dose of novolog 10 units (ordered 10 units TID ac prn).  BG 457 at 1015.  Please advise. Call was to notify you of blood glucose levels, obtain prn parameters, and if further orders needed.  [employer], LPN CB# [phone]                 FAX# [phone]  Patient is presently at cancer center and also has another appt scheduled there this afternoon at 1530.   Spoke with PCP in person, verbal order to advise cancer center nurse to proceed with ordered Novolog 10 units.  No additional orders @ this time.

## 2013-11-14 ENCOUNTER — Encounter: Payer: Self-pay | Admitting: Internal Medicine

## 2013-11-14 ENCOUNTER — Ambulatory Visit (INDEPENDENT_AMBULATORY_CARE_PROVIDER_SITE_OTHER): Payer: 59 | Admitting: Internal Medicine

## 2013-11-14 VITALS — BP 126/90 | HR 60 | Temp 97.9°F | Resp 16 | Wt 151.8 lb

## 2013-11-14 DIAGNOSIS — Z23 Encounter for immunization: Secondary | ICD-10-CM

## 2013-11-14 DIAGNOSIS — E1165 Type 2 diabetes mellitus with hyperglycemia: Secondary | ICD-10-CM

## 2013-11-14 DIAGNOSIS — I10 Essential (primary) hypertension: Secondary | ICD-10-CM

## 2013-11-14 DIAGNOSIS — IMO0001 Reserved for inherently not codable concepts without codable children: Secondary | ICD-10-CM

## 2013-11-14 DIAGNOSIS — IMO0002 Reserved for concepts with insufficient information to code with codable children: Secondary | ICD-10-CM

## 2013-11-14 DIAGNOSIS — Z0000 Encounter for general adult medical examination without abnormal findings: Secondary | ICD-10-CM

## 2013-11-14 DIAGNOSIS — Z2911 Encounter for prophylactic immunotherapy for respiratory syncytial virus (RSV): Secondary | ICD-10-CM

## 2013-11-14 LAB — HM DIABETES FOOT EXAM: HM Diabetic Foot Exam: NORMAL

## 2013-11-14 MED ORDER — METFORMIN HCL ER 750 MG PO TB24: 750.0000 mg | ORAL_TABLET | Freq: Every day | ORAL | Status: DC

## 2013-11-14 MED ORDER — INSULIN ASPART 100 UNIT/ML ~~LOC~~ SOLN: 10.0000 [IU] | Freq: Three times a day (TID) | SUBCUTANEOUS | Status: DC

## 2013-11-14 MED ORDER — INSULIN GLARGINE 100 UNIT/ML ~~LOC~~ SOLN: 31.0000 [IU] | Freq: Every day | SUBCUTANEOUS | Status: DC

## 2013-11-14 NOTE — Progress Notes (Signed)
Subjective:    Patient ID: Marisa Forbes, female    DOB: 1951/03/22, 63 y.o.   MRN: [id number]  Diabetes She presents for her follow-up diabetic visit. She has type 2 diabetes mellitus. Her disease course has been fluctuating. There are no hypoglycemic associated symptoms. Pertinent negatives for hypoglycemia include no dizziness or tremors. Pertinent negatives for diabetes include no blurred vision, no chest pain, no fatigue, no foot paresthesias, no foot ulcerations, no polydipsia, no polyphagia, no polyuria, no visual change, no weakness and no weight loss. There are no hypoglycemic complications. Symptoms are worsening. There are no diabetic complications. Current diabetic treatment includes oral agent (monotherapy) and insulin injections. She is compliant with treatment most of the time. Her weight is stable. She is following a generally healthy diet. Meal planning includes avoidance of concentrated sweets. She never participates in exercise. There is no change in her home blood glucose trend. An ACE inhibitor/angiotensin II receptor blocker is being taken. She does not see a podiatrist.Eye exam is current.      Review of Systems  Constitutional: Negative.  Negative for fever, chills, weight loss, diaphoresis, appetite change and fatigue.  HENT: Negative.  Negative for trouble swallowing and voice change.   Eyes: Negative.  Negative for blurred vision.  Respiratory: Negative.  Negative for cough, shortness of breath and wheezing.   Cardiovascular: Negative.  Negative for chest pain, palpitations and leg swelling.  Gastrointestinal: Negative.   Endocrine: Negative.  Negative for polydipsia, polyphagia and polyuria.  Genitourinary: Negative.   Musculoskeletal: Negative.  Negative for arthralgias, back pain, myalgias, neck pain and neck stiffness.  Skin: Negative.   Allergic/Immunologic: Negative.   Neurological: Negative.  Negative for dizziness, tremors, syncope, weakness,  light-headedness and numbness.  Hematological: Negative.  Negative for adenopathy. Does not bruise/bleed easily.  Psychiatric/Behavioral: Negative.        Objective:   Physical Exam  Vitals reviewed. Constitutional: She is oriented to person, place, and time. She appears well-developed and well-nourished. No distress.  HENT:  Head: Normocephalic and atraumatic.  Mouth/Throat: Oropharynx is clear and moist. No oropharyngeal exudate.  Eyes: Conjunctivae are normal. Right eye exhibits no discharge. Left eye exhibits no discharge. No scleral icterus.  Neck: Normal range of motion. Neck supple. No JVD present. No tracheal deviation present. No thyromegaly present.  Cardiovascular: Normal rate, regular rhythm, normal heart sounds and intact distal pulses.  Exam reveals no gallop and no friction rub.   No murmur heard. Pulmonary/Chest: Effort normal and breath sounds normal. No stridor. No respiratory distress. She has no wheezes. She has no rales. She exhibits no tenderness.  Abdominal: Soft. Bowel sounds are normal. She exhibits no distension and no mass. There is no tenderness. There is no rebound and no guarding.  Musculoskeletal: Normal range of motion. She exhibits no edema and no tenderness.  Lymphadenopathy:    She has no cervical adenopathy.  Neurological: She is oriented to person, place, and time.  Skin: Skin is warm and dry. No rash noted. She is not diaphoretic. No erythema. No pallor.  Psychiatric: She has a normal mood and affect. Her behavior is normal. Judgment and thought content normal.     Lab Results  Component Value Date   WBC 7.3 11/13/2013   HGB 16.1* 11/13/2013   HCT 48.1* 11/13/2013   PLT 181 11/13/2013   GLUCOSE 457* 11/13/2013   CHOL 119 06/26/2013   TRIG 144.0 06/26/2013   HDL 45.00 06/26/2013   LDLCALC 45 06/26/2013   ALT 27  11/13/2013   AST 16 11/13/2013   NA 138 11/13/2013   K 4.2 11/13/2013   CL 102 10/10/2013   CREATININE 0.9 11/13/2013   BUN 14.1 11/13/2013     CO2 26 11/13/2013   TSH 1.01 06/26/2013   INR 0.98 02/10/2013   HGBA1C 7.1* 06/26/2013       Assessment & Plan:

## 2013-11-14 NOTE — Progress Notes (Signed)
Pre visit review using our clinic review tool, if applicable. No additional management support is needed unless otherwise documented below in the visit note. 

## 2013-11-14 NOTE — Patient Instructions (Signed)
Type 2 Diabetes Mellitus, Adult Type 2 diabetes mellitus, often simply referred to as type 2 diabetes, is a long-lasting (chronic) disease. In type 2 diabetes, the pancreas does not make enough insulin (a hormone), the cells are less responsive to the insulin that is made (insulin resistance), or both. Normally, insulin moves sugars from food into the tissue cells. The tissue cells use the sugars for energy. The lack of insulin or the lack of normal response to insulin causes excess sugars to build up in the blood instead of going into the tissue cells. As a result, high blood sugar (hyperglycemia) develops. The effect of high sugar (glucose) levels can cause many complications. Type 2 diabetes was also previously called adult-onset diabetes but it can occur at any age.  RISK FACTORS  A person is predisposed to developing type 2 diabetes if someone in the family has the disease and also has one or more of the following primary risk factors:  Overweight.  An inactive lifestyle.  A history of consistently eating high-calorie foods. Maintaining a normal weight and regular physical activity can reduce the chance of developing type 2 diabetes. SYMPTOMS  A person with type 2 diabetes may not show symptoms initially. The symptoms of type 2 diabetes appear slowly. The symptoms include:  Increased thirst (polydipsia).  Increased urination (polyuria).  Increased urination during the night (nocturia).  Weight loss. This weight loss may be rapid.  Frequent, recurring infections.  Tiredness (fatigue).  Weakness.  Vision changes, such as blurred vision.  Fruity smell to your breath.  Abdominal pain.  Nausea or vomiting.  Cuts or bruises which are slow to heal.  Tingling or numbness in the hands or feet. DIAGNOSIS Type 2 diabetes is frequently not diagnosed until complications of diabetes are present. Type 2 diabetes is diagnosed when symptoms or complications are present and when blood  glucose levels are increased. Your blood glucose level may be checked by one or more of the following blood tests:  A fasting blood glucose test. You will not be allowed to eat for at least 8 hours before a blood sample is taken.  A random blood glucose test. Your blood glucose is checked at any time of the day regardless of when you ate.  A hemoglobin A1c blood glucose test. A hemoglobin A1c test provides information about blood glucose control over the previous 3 months.  An oral glucose tolerance test (OGTT). Your blood glucose is measured after you have not eaten (fasted) for 2 hours and then after you drink a glucose-containing beverage. TREATMENT   You may need to take insulin or diabetes medicine daily to keep blood glucose levels in the desired range.  You will need to match insulin dosing with exercise and healthy food choices. The treatment goal is to maintain the before meal blood sugar (preprandial glucose) level at 70 130 mg/dL. HOME CARE INSTRUCTIONS   Have your hemoglobin A1c level checked twice a year.  Perform daily blood glucose monitoring as directed by your caregiver.  Monitor urine ketones when you are ill and as directed by your caregiver.  Take your diabetes medicine or insulin as directed by your caregiver to maintain your blood glucose levels in the desired range.  Never run out of diabetes medicine or insulin. It is needed every day.  Adjust insulin based on your intake of carbohydrates. Carbohydrates can raise blood glucose levels but need to be included in your diet. Carbohydrates provide vitamins, minerals, and fiber which are an essential part of   a healthy diet. Carbohydrates are found in fruits, vegetables, whole grains, dairy products, legumes, and foods containing added sugars.    Eat healthy foods. Alternate 3 meals with 3 snacks.  Lose weight if overweight.  Carry a medical alert card or wear your medical alert jewelry.  Carry a 15 gram  carbohydrate snack with you at all times to treat low blood glucose (hypoglycemia). Some examples of 15 gram carbohydrate snacks include:  Glucose tablets, 3 or 4   Glucose gel, 15 gram tube  Raisins, 2 tablespoons (24 grams)  Jelly beans, 6  Animal crackers, 8  Regular pop, 4 ounces (120 mL)  Gummy treats, 9  Recognize hypoglycemia. Hypoglycemia occurs with blood glucose levels of 70 mg/dL and below. The risk for hypoglycemia increases when fasting or skipping meals, during or after intense exercise, and during sleep. Hypoglycemia symptoms can include:  Tremors or shakes.  Decreased ability to concentrate.  Sweating.  Increased heart rate.  Headache.  Dry mouth.  Hunger.  Irritability.  Anxiety.  Restless sleep.  Altered speech or coordination.  Confusion.  Treat hypoglycemia promptly. If you are alert and able to safely swallow, follow the 15:15 rule:  Take 15 20 grams of rapid-acting glucose or carbohydrate. Rapid-acting options include glucose gel, glucose tablets, or 4 ounces (120 mL) of fruit juice, regular soda, or low fat milk.  Check your blood glucose level 15 minutes after taking the glucose.  Take 15 20 grams more of glucose if the repeat blood glucose level is still 70 mg/dL or below.  Eat a meal or snack within 1 hour once blood glucose levels return to normal.    Be alert to polyuria and polydipsia which are early signs of hyperglycemia. An early awareness of hyperglycemia allows for prompt treatment. Treat hyperglycemia as directed by your caregiver.  Engage in at least 150 minutes of moderate-intensity physical activity a week, spread over at least 3 days of the week or as directed by your caregiver. In addition, you should engage in resistance exercise at least 2 times a week or as directed by your caregiver.  Adjust your medicine and food intake as needed if you start a new exercise or sport.  Follow your sick day plan at any time you  are unable to eat or drink as usual.  Avoid tobacco use.  Limit alcohol intake to no more than 1 drink per day for nonpregnant women and 2 drinks per day for men. You should drink alcohol only when you are also eating food. Talk with your caregiver whether alcohol is safe for you. Tell your caregiver if you drink alcohol several times a week.  Follow up with your caregiver regularly.  Schedule an eye exam soon after the diagnosis of type 2 diabetes and then annually.  Perform daily skin and foot care. Examine your skin and feet daily for cuts, bruises, redness, nail problems, bleeding, blisters, or sores. A foot exam by a caregiver should be done annually.  Brush your teeth and gums at least twice a day and floss at least once a day. Follow up with your dentist regularly.  Share your diabetes management plan with your workplace or school.  Stay up-to-date with immunizations.  Learn to manage stress.  Obtain ongoing diabetes education and support as needed.  Participate in, or seek rehabilitation as needed to maintain or improve independence and quality of life. Request a physical or occupational therapy referral if you are having foot or hand numbness or difficulties with grooming,   dressing, eating, or physical activity. SEEK MEDICAL CARE IF:   You are unable to eat food or drink fluids for more than 6 hours.  You have nausea and vomiting for more than 6 hours.  Your blood glucose level is over 240 mg/dL.  There is a change in mental status.  You develop an additional serious illness.  You have diarrhea for more than 6 hours.  You have been sick or have had a fever for a couple of days and are not getting better.  You have pain during any physical activity.  SEEK IMMEDIATE MEDICAL CARE IF:  You have difficulty breathing.  You have moderate to large ketone levels. MAKE SURE YOU:  Understand these instructions.  Will watch your condition.  Will get help right away if  you are not doing well or get worse. Document Released: 10/16/2005 Document Revised: 07/10/2012 Document Reviewed: 05/14/2012 Exit[employer] Patient Information 2014 [employer].  

## 2013-11-15 ENCOUNTER — Telehealth: Payer: Self-pay | Admitting: Internal Medicine

## 2013-11-15 NOTE — Telephone Encounter (Signed)
Relevant patient education assigned to patient using Emmi. ° °

## 2013-11-17 ENCOUNTER — Telehealth: Payer: Self-pay

## 2013-11-17 NOTE — Assessment & Plan Note (Addendum)
I will recheck her A1C and will address if needed Will also monitor her renal function I have also asked her to see diabetic education

## 2013-11-17 NOTE — Telephone Encounter (Signed)
Relevant patient education assigned to patient using Emmi. ° °

## 2013-11-17 NOTE — Assessment & Plan Note (Signed)
Her BP is well controlled Today I will check her lytes and renal function 

## 2014-01-12 ENCOUNTER — Ambulatory Visit: Payer: BC Managed Care – PPO | Admitting: Internal Medicine

## 2014-01-26 ENCOUNTER — Encounter: Payer: Self-pay | Admitting: Internal Medicine

## 2014-02-11 ENCOUNTER — Other Ambulatory Visit (HOSPITAL_BASED_OUTPATIENT_CLINIC_OR_DEPARTMENT_OTHER): Payer: BC Managed Care – PPO

## 2014-02-11 ENCOUNTER — Ambulatory Visit
Admission: RE | Admit: 2014-02-11 | Discharge: 2014-02-11 | Disposition: A | Discharge status: Home / Self Care | Payer: BC Managed Care – PPO | Source: Ambulatory Visit | Attending: Internal Medicine | Admitting: Internal Medicine

## 2014-02-11 ENCOUNTER — Encounter: Payer: Self-pay | Admitting: Adult Health

## 2014-02-11 ENCOUNTER — Ambulatory Visit (HOSPITAL_BASED_OUTPATIENT_CLINIC_OR_DEPARTMENT_OTHER): Payer: BC Managed Care – PPO | Admitting: Adult Health

## 2014-02-11 VITALS — BP 158/86 | HR 64 | Temp 97.1°F | Resp 18 | Ht 65.0 in | Wt 148.3 lb

## 2014-02-11 DIAGNOSIS — Z853 Personal history of malignant neoplasm of breast: Secondary | ICD-10-CM

## 2014-02-11 DIAGNOSIS — Z9889 Other specified postprocedural states: Secondary | ICD-10-CM

## 2014-02-11 DIAGNOSIS — C50219 Malignant neoplasm of upper-inner quadrant of unspecified female breast: Secondary | ICD-10-CM

## 2014-02-11 DIAGNOSIS — E119 Type 2 diabetes mellitus without complications: Secondary | ICD-10-CM

## 2014-02-11 DIAGNOSIS — C50919 Malignant neoplasm of unspecified site of unspecified female breast: Secondary | ICD-10-CM

## 2014-02-11 LAB — CBC WITH DIFFERENTIAL/PLATELET
BASO%: 0.5 % (ref 0.0–2.0)
Basophils Absolute: 0 10*3/uL (ref 0.0–0.1)
EOS%: 1.9 % (ref 0.0–7.0)
Eosinophils Absolute: 0.1 10*3/uL (ref 0.0–0.5)
HCT: 45.7 % (ref 34.8–46.6)
HGB: 15.3 g/dL (ref 11.6–15.9)
LYMPH%: 20 % (ref 14.0–49.7)
MCH: 28.6 pg (ref 25.1–34.0)
MCHC: 33.5 g/dL (ref 31.5–36.0)
MCV: 85.6 fL (ref 79.5–101.0)
MONO#: 0.5 10*3/uL (ref 0.1–0.9)
MONO%: 7.8 % (ref 0.0–14.0)
NEUT#: 4.3 10*3/uL (ref 1.5–6.5)
NEUT%: 69.8 % (ref 38.4–76.8)
Platelets: 177 10*3/uL (ref 145–400)
RBC: 5.34 10*6/uL (ref 3.70–5.45)
RDW: 13.5 % (ref 11.2–14.5)
WBC: 6.1 10*3/uL (ref 3.9–10.3)
lymph#: 1.2 10*3/uL (ref 0.9–3.3)

## 2014-02-11 LAB — COMPREHENSIVE METABOLIC PANEL (CC13)
ALT: 27 U/L (ref 0–55)
AST: 17 U/L (ref 5–34)
Albumin: 3.8 g/dL (ref 3.5–5.0)
Alkaline Phosphatase: 106 U/L (ref 40–150)
Anion Gap: 8 mEq/L (ref 3–11)
BUN: 13 mg/dL (ref 7.0–26.0)
CO2: 24 mEq/L (ref 22–29)
Calcium: 9.8 mg/dL (ref 8.4–10.4)
Chloride: 105 mEq/L (ref 98–109)
Creatinine: 0.8 mg/dL (ref 0.6–1.1)
Glucose: 339 mg/dl — ABNORMAL HIGH (ref 70–140)
Potassium: 4.4 mEq/L (ref 3.5–5.1)
Sodium: 137 mEq/L (ref 136–145)
Total Bilirubin: 0.5 mg/dL (ref 0.20–1.20)
Total Protein: 6.4 g/dL (ref 6.4–8.3)

## 2014-02-11 LAB — HM MAMMOGRAPHY: HM Mammogram: NORMAL

## 2014-02-11 NOTE — Patient Instructions (Signed)
You are doing well.  You have no sign of recurrence.  Examine your breasts monthly.  Talk to your PCP about getting a bone density test and about taking some vitamin D supplementation.  Please call us if you have any questions or concerns.     We will see you back in 6 months.

## 2014-02-11 NOTE — Progress Notes (Signed)
Hematology and Oncology Follow Up Visit  Marisa Forbes [id number] 30-Dec-1950 62 y.o. 02/13/2014 6:19 AM     Principle Diagnosis:Marisa Forbes 63 y.o. 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     Prior Therapy: #1in January 2014 patient was found to have a suspicious area on screening mammography in the upper outer quadrant of the left breast. She ultimately underwent biopsy of this area which revealed invasive ductal carcinoma. the tumor was estrogen and progesterone receptor negative as well as HER-2/neu negative MRI revealed a solitary mass measuring 1.6 x 1.5 x 1.5 cm in size. Patient was seen in the multidisciplinary breast clinic.   #2patient is status post left lumpectomy with sentinel lymph node biopsies. Her final pathology reveals a 1.4 cm invasive ductal carcinoma grade 3, ER negative PR negative HER-2/neu negative with Ki-67 54%. Sentinel lymph nodes were negative for metastatic disease.   #3 s/p adjuvant chemotherapy consisting of CMF. She started CMF on 02/20/13. She underwent 6 cycles of this and completed therapy on 06/05/13.   #4 patient underwent radiation therapy with Dr. Sondra Come from 06/23/13-08/08/13  Current therapy:  observation  Interim History: Marisa Forbes 63 y.o. female with h/o triple negative invasive ductal carcinoma of the left breast.  She is here for follow up and monitoring today.  She is doing very well.  She had a mammogram today that was normal.  She denies fevers, chills, nausea, vomiting, constipation, diarrhea, weight loss, new pain, or any further concerns.  A 10 point ROS is negative.  We reviewed her health maintenance below.   Medications:  Current Outpatient Prescriptions  Medication Sig Dispense Refill  . amLODipine (NORVASC) 5 MG tablet Take 5 mg by mouth 2 (two) times daily.      Marland Kitchen aspirin EC 81 MG EC tablet Take 1 tablet (81 mg total) by mouth daily.      Marland Kitchen atorvastatin  (LIPITOR) 80 MG tablet Take 80 mg by mouth daily with supper.      Marland Kitchen glucose blood (ONETOUCH VERIO) test strip Use TID  100 each  12  . ibuprofen (ADVIL,MOTRIN) 200 MG tablet Take 200 mg by mouth every 6 (six) hours as needed for mild pain or moderate pain. For pain      . insulin aspart (NOVOLOG) 100 UNIT/ML injection Inject 10 Units into the skin 3 (three) times daily with meals.  10 mL  11  . insulin glargine (LANTUS) 100 UNIT/ML injection Inject 0.31 mLs (31 Units total) into the skin at bedtime.  9 mL  12  . isosorbide mononitrate (IMDUR) 60 MG 24 hr tablet Take 60 mg by mouth daily.      Marland Kitchen lisinopril (PRINIVIL,ZESTRIL) 40 MG tablet Take 40 mg by mouth daily with breakfast.      . metFORMIN (GLUCOPHAGE-XR) 750 MG 24 hr tablet Take 1 tablet (750 mg total) by mouth daily with breakfast.  90 tablet  1  . metoprolol (LOPRESSOR) 50 MG tablet Take 125 mg by mouth 2 (two) times daily.      . diazepam (VALIUM) 5 MG tablet Take 5 mg by mouth every 6 (six) hours as needed for anxiety.      Marland Kitchen HYDROcodone-acetaminophen (NORCO/VICODIN) 5-325 MG per tablet Take 1 tablet by mouth every 4 (four) hours as needed.  10 tablet  0  . nitroGLYCERIN (NITROSTAT) 0.4 MG SL tablet Place 0.4 mg under the tongue every 5 (five) minutes as needed for chest  pain.       No current facility-administered medications for this visit.     Allergies: No Known Allergies  Medical History: Past Medical History  Diagnosis Date  . HTN (hypertension)   . Diabetes mellitus without complication   . Myocardial infarction 09/07/12  . Breast cancer 12/03/12    left  . History of radiation therapy 06/23/2013-08/08/2013    62.4 gray to left breast  . Hypercholesteremia   . Coronary artery disease   . Stroke, embolic July 9758  . Peripheral vision loss     left  . Balance problems     due to stroke  . History of cancer chemotherapy     last in August 2014  . GERD (gastroesophageal reflux disease)     food related  . Arthritis      back    Surgical History:  Past Surgical History  Procedure Laterality Date  . Coronary angioplasty with stent placement    . Breast lumpectomy with needle localization and axillary sentinel lymph node bx Left 01/08/2013    Procedure: LEFT BREAST WIRE GUIDED  LUMPECTOMY AND LEFT AXILLARY SENTINEL  NODE BX;  Surgeon: Rolm Bookbinder, MD;  Location: [place];  Service: General;  Laterality: Left;  . Portacath placement Right 02/17/2013    Procedure: INSERTION PORT-A-CATH;  Surgeon: Rolm Bookbinder, MD;  Location: [place];  Service: General;  Laterality: Right;  . Back surgery      x3, lower back  . Cataract extraction Bilateral   . Port-a-cath removal N/A 10/14/2013    Procedure: REMOVAL PORT-A-CATH;  Surgeon: Rolm Bookbinder, MD;  Location: [facility];  Service: General;  Laterality: N/A;     Review of Systems: A 10 point review of systems was conducted and is otherwise negative except for what is noted above.    Health Maintenance  Mammogram:  02/11/14 Colonoscopy:  Never, referred by PCP Bone Density Scan:  never Pap Smear: due, will have done at next PCP appt Eye Exam: 07/2013 Vitamin D Level: 05/2013 Lipid Panel: 05/2013   Physical Exam: Blood pressure 158/86, pulse 64, temperature 97.1 F (36.2 C), temperature source Oral, resp. rate 18, height _0  (1.651 m), weight 148 lb 4.8 oz (67.268 kg). GENERAL: Patient is a well appearing female in no acute distress HEENT:  Sclerae anicteric.  Oropharynx clear and moist. No ulcerations or evidence of oropharyngeal candidiasis. Neck is supple.  NODES:  No cervical, supraclavicular, or axillary lymphadenopathy palpated.  BREAST EXAM:  Left breast s/p lumpectomy, no nodularity, nodules or masses.  Radiation changes noted in the left breast.  Right breast without nodules, masses, skin changes, or nipple changes.  Benign bilateral breast exam.  LUNGS:  Clear to auscultation bilaterally.  No wheezes or rhonchi. HEART:  Regular rate and  rhythm. No murmur appreciated. ABDOMEN:  Soft, nontender.  Positive, normoactive bowel sounds. No organomegaly palpated. MSK:  No focal spinal tenderness to palpation. Full range of motion bilaterally in the upper extremities. EXTREMITIES:  No peripheral edema.   SKIN:  Clear with no obvious rashes or skin changes. No nail dyscrasia. NEURO:  Nonfocal. Well oriented.  Appropriate affect. ECOG PERFORMANCE STATUS: 0 - Asymptomatic   Lab Results: Lab Results  Component Value Date   WBC 6.1 02/11/2014   HGB 15.3 02/11/2014   HCT 45.7 02/11/2014   MCV 85.6 02/11/2014   PLT 177 02/11/2014     Chemistry      Component Value Date/Time   NA 137 02/11/2014 1112  NA 138 10/10/2013 1100   K 4.4 02/11/2014 1112   K 3.9 10/10/2013 1100   CL 102 10/10/2013 1100   CL 110* 04/10/2013 1020   CO2 24 02/11/2014 1112   CO2 24 10/10/2013 1100   BUN 13.0 02/11/2014 1112   BUN 12 10/10/2013 1100   CREATININE 0.8 02/11/2014 1112   CREATININE 0.50 10/10/2013 1100      Component Value Date/Time   CALCIUM 9.8 02/11/2014 1112   CALCIUM 9.8 10/10/2013 1100   ALKPHOS 106 02/11/2014 1112   ALKPHOS 91 06/26/2013 0845   AST 17 02/11/2014 1112   AST 22 06/26/2013 0845   ALT 27 02/11/2014 1112   ALT 28 06/26/2013 0845   BILITOT 0.50 02/11/2014 1112   BILITOT 0.7 06/26/2013 0845     Assessment and Plan: Marisa Forbes 63 y.o. female with  1. History of triple negative invasive ductal carcinoma of the left breast.  See prior history above.  The patient is doing well today and has no sign of recurrence.  Her CBC is stable.  I reviewed this with her in detail.    2. Survivorship: We reviewed her health maintenance above.  She does have an upcoming colonoscopy and her pap smear is scheduled.  She will discuss a bone density exam with her PCP at the time of her appointment for a pap smear.  Otherwise, she is up to date.  3. Hyperglycemia.  I reviewed her CMP with her in detail which demonstrated a glucose of 339.  She does  have diabetes.  She informed me that her husband cooked her a big breakfast and she had a lot of carbohydrate intake this morning which tends to elevate her blood glucose.  We will let her PCP know about this.    The patient will return in 6 months for labs and evaluation.   She knows to call us in the interim for any questions or concerns.  We can certainly see her sooner if needed.  I spent 25 minutes counseling the patient face to face.  The total time spent in the appointment was 30 minutes.  Minette Headland, [place] [phone] 02/13/2014 6:19 AM

## 2014-02-12 ENCOUNTER — Telehealth: Payer: Self-pay | Admitting: Oncology

## 2014-02-12 NOTE — Telephone Encounter (Signed)
s.w. pt and advised on OCT appt....pt ok adn aware °

## 2014-02-20 ENCOUNTER — Ambulatory Visit (INDEPENDENT_AMBULATORY_CARE_PROVIDER_SITE_OTHER): Payer: BC Managed Care – PPO | Admitting: Internal Medicine

## 2014-02-20 ENCOUNTER — Encounter: Payer: Self-pay | Admitting: Internal Medicine

## 2014-02-20 VITALS — BP 158/90 | HR 97 | Ht 65.0 in | Wt 147.0 lb

## 2014-02-20 DIAGNOSIS — H8110 Benign paroxysmal vertigo, unspecified ear: Secondary | ICD-10-CM

## 2014-02-20 DIAGNOSIS — I10 Essential (primary) hypertension: Secondary | ICD-10-CM

## 2014-02-20 DIAGNOSIS — I2510 Atherosclerotic heart disease of native coronary artery without angina pectoris: Secondary | ICD-10-CM

## 2014-02-20 MED ORDER — METOPROLOL TARTRATE 50 MG PO TABS: 150.0000 mg | ORAL_TABLET | Freq: Two times a day (BID) | ORAL | Status: DC

## 2014-02-20 NOTE — Progress Notes (Signed)
HPI Patient is a 63 yo who suffered NSTEMI in November 2013  Cath showed .LM: nl  LAD: 100% occlusion after Dx1 and Septal1 with Timi 0 flow; 80% ostial and Mid Dx; once LAD opened diffuse 80 - 90% mid LAD stenosis in a small vessel  LCX: nl  RCA: 50% mid, 80% distal before PDA takeoff  PTCA/Stent LAD at occluded site with 2.25 x 15 Xience DES stent to 2.30 mm;  PTCA to diffusely diseased mid LAD with long infations up to 2.14 maximally, not stented with TIMI 3 flow returned.  EF: 45 - 50% with mild anterolateral hypokinesis.  20% L renal narrowing  Post procedure the patient hand recurrent pain Went back for second cardiac cath This showed: Stable post PCI anatomy with diffuse distal LAD disease & a suggestion of small dissection distal to tiny D2. Patent LAD stent with TIMI 3 flow in all vessels. Stable D2 lesions - in a 1.5-~2.0 mm vessel, unlikely cause of resting angina --The patient was discharged home on [place] She has never filled due to cost.   Since the spring the patient has undergone chemoRx for breast CA  And XRT  She denies CP  Breathing is stable.   No Known Allergies  Current Outpatient Prescriptions  Medication Sig Dispense Refill  . amLODipine (NORVASC) 5 MG tablet Take 5 mg by mouth 2 (two) times daily.      Marland Kitchen aspirin EC 81 MG EC tablet Take 1 tablet (81 mg total) by mouth daily.      Marland Kitchen atorvastatin (LIPITOR) 80 MG tablet Take 80 mg by mouth daily with supper.      . diazepam (VALIUM) 5 MG tablet Take 5 mg by mouth every 6 (six) hours as needed for anxiety.      Marland Kitchen glucose blood (ONETOUCH VERIO) test strip Use TID  100 each  12  . HYDROcodone-acetaminophen (NORCO/VICODIN) 5-325 MG per tablet Take 1 tablet by mouth every 4 (four) hours as needed.  10 tablet  0  . ibuprofen (ADVIL,MOTRIN) 200 MG tablet Take 200 mg by mouth every 6 (six) hours as needed for mild pain or moderate pain. For pain      . insulin aspart (NOVOLOG) 100 UNIT/ML injection Inject 10 Units into the skin  3 (three) times daily with meals.  10 mL  11  . insulin glargine (LANTUS) 100 UNIT/ML injection Inject 0.31 mLs (31 Units total) into the skin at bedtime.  9 mL  12  . isosorbide mononitrate (IMDUR) 60 MG 24 hr tablet Take 60 mg by mouth daily.      Marland Kitchen lisinopril (PRINIVIL,ZESTRIL) 40 MG tablet Take 40 mg by mouth daily with breakfast.      . metFORMIN (GLUCOPHAGE-XR) 750 MG 24 hr tablet Take 1 tablet (750 mg total) by mouth daily with breakfast.  90 tablet  1  . metoprolol (LOPRESSOR) 50 MG tablet Take 2 1/2 tab twice daily      . nitroGLYCERIN (NITROSTAT) 0.4 MG SL tablet Place 0.4 mg under the tongue every 5 (five) minutes as needed for chest pain.       No current facility-administered medications for this visit.    Past Medical History  Diagnosis Date  . HTN (hypertension)   . Diabetes mellitus without complication   . Myocardial infarction 09/07/12  . Breast cancer 12/03/12    left  . History of radiation therapy 06/23/2013-08/08/2013    62.4 gray to left breast  . Hypercholesteremia   . Coronary artery  disease   . Stroke, embolic July 4268  . Peripheral vision loss     left  . Balance problems     due to stroke  . History of cancer chemotherapy     last in August 2014  . GERD (gastroesophageal reflux disease)     food related  . Arthritis     back    Past Surgical History  Procedure Laterality Date  . Coronary angioplasty with stent placement    . Breast lumpectomy with needle localization and axillary sentinel lymph node bx Left 01/08/2013    Procedure: LEFT BREAST WIRE GUIDED  LUMPECTOMY AND LEFT AXILLARY SENTINEL  NODE BX;  Surgeon: Rolm Bookbinder, MD;  Location: [place];  Service: General;  Laterality: Left;  . Portacath placement Right 02/17/2013    Procedure: INSERTION PORT-A-CATH;  Surgeon: Rolm Bookbinder, MD;  Location: [place];  Service: General;  Laterality: Right;  . Back surgery      x3, lower back  . Cataract extraction Bilateral   . Port-a-cath removal N/A  10/14/2013    Procedure: REMOVAL PORT-A-CATH;  Surgeon: Rolm Bookbinder, MD;  Location: [facility];  Service: General;  Laterality: N/A;    Family History  Problem Relation Age of Onset  . Cancer Neg Hx   . Diabetes Neg Hx   . Hyperlipidemia Neg Hx   . Hypertension Neg Hx   . Kidney disease Neg Hx   . Stroke Neg Hx     History   Social History  . Marital Status: Married    Spouse Name: [name]    Number of Children: 2  . Years of Education: college   Occupational History  .     Social History Main Topics  . Smoking status: Never Smoker   . Smokeless tobacco: Never Used  . Alcohol Use: No  . Drug Use: No  . Sexual Activity: Yes   Other Topics Concern  . Not on file   Social History Narrative   Patient is out of work on medical leave and lives at home with husband.    Patient has 2 children and some college education.              Review of Systems:  All systems reviewed.  They are negative to the above problem except as previously stated.  Vital Signs: BP 158/90  Pulse 97  Ht 5\' 5"  (1.651 m)  Wt 147 lb (66.679 kg)  BMI 24.46 kg/m2  Physical Exam Patient is in NAD HEENT:  Normocephalic, atraumatic. EOMI, PERRLA.  Neck: JVP is normal.  No bruits.  Lungs: clear to auscultation. No rales no wheezes.  Heart: Regular rate and rhythm. Normal S1, S2. No S3.   No significant murmurs. PMI not displaced.  Abdomen:  Supple, nontender. Normal bowel sounds. No masses. No hepatomegaly.  Extremities:   Good distal pulses throughout. No lower extremity edema.  Musculoskeletal :moving all extremities.  Neuro:   alert and oriented x3.  CN II-XII grossly intact.  EKG  SR 97  Inferior MI  Anterolateral MI   Assessment and Plan:  1.  CAD  Patient without symptoms of angina.  Keep on same regimen  Encouraged her to walk more  3.  HTN  BP is high  I would increase metoprolol to 3 tabs BID  Follow BP at home  If high call  Bring cuff to next visist 4.  HL  Keep on statin.   Excellent control

## 2014-02-20 NOTE — Patient Instructions (Signed)
Your physician has recommended you make the following change in your medication:  INCREASE METOPROLOL TO 3 TABLETS (150 MG) BY MOUTH TWICE A DAY  Your physician wants you to follow-up in: October, 2015 WITH DR ROSS.  You will receive a reminder letter in the mail two months in advance. If you don't receive a letter, please call our office to schedule the follow-up appointment.  Please monitor your blood pressure at home and keep a record. Call Dr. Harrington Challenger if blood pressure readings are still elevated.

## 2014-05-13 ENCOUNTER — Telehealth: Payer: Self-pay

## 2014-05-13 NOTE — Telephone Encounter (Signed)
LVM at both numbers for pt to call back to schedule have labs done. Orders are already in, pt just needs to go to lab and have blood drawn.

## 2014-05-26 ENCOUNTER — Encounter: Payer: Self-pay | Admitting: Internal Medicine

## 2014-06-26 ENCOUNTER — Encounter (INDEPENDENT_AMBULATORY_CARE_PROVIDER_SITE_OTHER): Payer: Self-pay | Admitting: General Surgery

## 2014-08-04 ENCOUNTER — Encounter: Payer: Self-pay | Admitting: Emergency Medicine

## 2014-08-04 ENCOUNTER — Emergency Department (INDEPENDENT_AMBULATORY_CARE_PROVIDER_SITE_OTHER)
Admission: EM | Admit: 2014-08-04 | Discharge: 2014-08-04 | Disposition: A | Discharge status: Home / Self Care | Payer: BC Managed Care – PPO | Source: Home / Self Care

## 2014-08-04 DIAGNOSIS — IMO0002 Reserved for concepts with insufficient information to code with codable children: Secondary | ICD-10-CM

## 2014-08-04 DIAGNOSIS — E1165 Type 2 diabetes mellitus with hyperglycemia: Secondary | ICD-10-CM

## 2014-08-04 DIAGNOSIS — R251 Tremor, unspecified: Secondary | ICD-10-CM

## 2014-08-04 MED ORDER — INSULIN ASPART 100 UNIT/ML FLEXPEN: 10.0000 [IU] | PEN_INJECTOR | Freq: Three times a day (TID) | SUBCUTANEOUS | Status: DC

## 2014-08-04 MED ORDER — METFORMIN HCL ER 750 MG PO TB24: 750.0000 mg | ORAL_TABLET | Freq: Every day | ORAL | Status: DC

## 2014-08-04 MED ORDER — INSULIN GLARGINE 100 UNIT/ML SOLOSTAR PEN: PEN_INJECTOR | SUBCUTANEOUS | Status: DC

## 2014-08-04 NOTE — ED Provider Notes (Signed)
CSN: [id number]     Arrival date & time 08/04/14  1959 History   None    Chief Complaint  Patient presents with  . Tremors   (Consider location/radiation/quality/duration/timing/severity/associated sxs/prior Treatment) HPI Pt is a 63 yo female who presents to the clinic not feeling well. Hx of diabetes and been out of medication for last 2 weeks. She has appt for next Friday. BG was over 200 after lunch today. She feels very weak and shaky. She is on lantus, novolog, and metformin. No URI symptoms. No LOC. No unilateral extremity weakness or slurred speech.   Past Medical History  Diagnosis Date  . HTN (hypertension)   . Diabetes mellitus without complication   . Myocardial infarction 09/07/12  . Breast cancer 12/03/12    left  . History of radiation therapy 06/23/2013-08/08/2013    62.4 gray to left breast  . Hypercholesteremia   . Coronary artery disease   . Stroke, embolic July 2440  . Peripheral vision loss     left  . Balance problems     due to stroke  . History of cancer chemotherapy     last in August 2014  . GERD (gastroesophageal reflux disease)     food related  . Arthritis     back   Past Surgical History  Procedure Laterality Date  . Coronary angioplasty with stent placement    . Breast lumpectomy with needle localization and axillary sentinel lymph node bx Left 01/08/2013    Procedure: LEFT BREAST WIRE GUIDED  LUMPECTOMY AND LEFT AXILLARY SENTINEL  NODE BX;  Surgeon: Rolm Bookbinder, MD;  Location: [place];  Service: General;  Laterality: Left;  . Portacath placement Right 02/17/2013    Procedure: INSERTION PORT-A-CATH;  Surgeon: Rolm Bookbinder, MD;  Location: [place];  Service: General;  Laterality: Right;  . Back surgery      x3, lower back  . Cataract extraction Bilateral   . Port-a-cath removal N/A 10/14/2013    Procedure: REMOVAL PORT-A-CATH;  Surgeon: Rolm Bookbinder, MD;  Location: [facility];  Service: General;  Laterality: N/A;   Family History  Problem  Relation Age of Onset  . Cancer Neg Hx   . Diabetes Neg Hx   . Hyperlipidemia Neg Hx   . Hypertension Neg Hx   . Kidney disease Neg Hx   . Stroke Neg Hx    History  Substance Use Topics  . Smoking status: Never Smoker   . Smokeless tobacco: Never Used  . Alcohol Use: No   OB History   Grav Para Term Preterm Abortions TAB SAB Ect Mult Living                 Review of Systems  All other systems reviewed and are negative.   Allergies  Review of patient's allergies indicates no known allergies.  Home Medications   Prior to Admission medications   Medication Sig Start Date End Date Taking? Authorizing Provider  amLODipine (NORVASC) 5 MG tablet Take 5 mg by mouth 2 (two) times daily. 09/12/13   Fay Records, MD  aspirin EC 81 MG EC tablet Take 1 tablet (81 mg total) by mouth daily. 09/13/12   Brett Canales, PA-C  atorvastatin (LIPITOR) 80 MG tablet Take 80 mg by mouth daily with supper.    Historical Provider, MD  diazepam (VALIUM) 5 MG tablet Take 5 mg by mouth every 6 (six) hours as needed for anxiety. 06/23/13   Janith Lima, MD  glucose blood [facility]  VERIO) test strip Use TID 10/03/12   Janith Lima, MD  HYDROcodone-acetaminophen (NORCO/VICODIN) 5-325 MG per tablet Take 1 tablet by mouth every 4 (four) hours as needed. 10/14/13   Rolm Bookbinder, MD  ibuprofen (ADVIL,MOTRIN) 200 MG tablet Take 200 mg by mouth every 6 (six) hours as needed for mild pain or moderate pain. For pain    Historical Provider, MD  insulin aspart (NOVOLOG) 100 UNIT/ML FlexPen Inject 10 Units into the skin 3 (three) times daily with meals. 08/04/14   Thayden Lemire L Kennethia Lynes, PA-C  insulin aspart (NOVOLOG) 100 UNIT/ML injection Inject 10 Units into the skin 3 (three) times daily with meals. 11/14/13   Janith Lima, MD  Insulin Glargine (LANTUS SOLOSTAR) 100 UNIT/ML Solostar Pen Inject 31 units [place] at bedtime. 08/04/14   Keny Donald L Kaelem Brach, PA-C  insulin glargine (LANTUS) 100 UNIT/ML injection Inject 0.31 mLs (31  Units total) into the skin at bedtime. 11/14/13   Janith Lima, MD  isosorbide mononitrate (IMDUR) 60 MG 24 hr tablet Take 60 mg by mouth daily. 09/12/13   Fay Records, MD  lisinopril (PRINIVIL,ZESTRIL) 40 MG tablet Take 40 mg by mouth daily with breakfast. 09/12/13   Fay Records, MD  metFORMIN (GLUCOPHAGE XR) 750 MG 24 hr tablet Take 1 tablet (750 mg total) by mouth daily with breakfast. 08/04/14   Donella Stade, PA-C  metFORMIN (GLUCOPHAGE-XR) 750 MG 24 hr tablet Take 1 tablet (750 mg total) by mouth daily with breakfast. 11/14/13   Janith Lima, MD  metoprolol (LOPRESSOR) 50 MG tablet Take 3 tablets (150 mg total) by mouth 2 (two) times daily. 02/20/14   Fay Records, MD  nitroGLYCERIN (NITROSTAT) 0.4 MG SL tablet Place 0.4 mg under the tongue every 5 (five) minutes as needed for chest pain. 09/13/12   Brett Canales, PA-C   BP 127/78  Pulse 100  Temp(Src) 99.4 F (37.4 C) (Oral)  Resp 16  Ht 5\' 5"  (1.651 m)  Wt 140 lb (63.504 kg)  BMI 23.30 kg/m2  SpO2 97% Physical Exam  Constitutional: She is oriented to person, place, and time. She appears well-developed and well-nourished.  HENT:  Head: Normocephalic and atraumatic.  Right Ear: External ear normal.  Left Ear: External ear normal.  Nose: Nose normal.  Mouth/Throat: Oropharynx is clear and moist.  Eyes: Conjunctivae are normal. Right eye exhibits no discharge. Left eye exhibits no discharge.  Neck: Normal range of motion. Neck supple.  Cardiovascular: Normal rate and normal heart sounds.   Pulmonary/Chest: Effort normal and breath sounds normal.  Musculoskeletal:  Bilateral upper and lower extermity 5/5.   Lymphadenopathy:    She has no cervical adenopathy.  Neurological: She is alert and oriented to person, place, and time.  Skin: Skin is dry.  Psychiatric: She has a normal mood and affect. Her behavior is normal.    ED Course  Procedures (including critical care time) Labs Review Labs Reviewed - No data to  display  Imaging Review No results found.   MDM   1. Diabetes mellitus type II, uncontrolled   2. Shaking    BG 325.  Discussed with patient I feel that symptoms are consistent with elevated blood sugars and not being on DM medications.  GAve Sample Toujeo [id number] 05/2015. To start 31 units at bedtime. Pt aware toujeo equivalent to lantus. We did not have any lantus samples. Too late to get rx for lantus refilled tonight. Did give one month insulin and metformin to patient.  Discussed with patient how important it is NOT to run out of diabetes medication.  Continue to check blood glucose.  Follow up with PCP ASAP for management.  If BG is above 400 need to follow up in ER or Urgent Care.   Donella Stade, PA-C 08/05/14 1250

## 2014-08-04 NOTE — Discharge Instructions (Signed)
Start back taking diabetic medication as directed

## 2014-08-04 NOTE — ED Notes (Signed)
Patient states has not felt well today; has history of diabetes and her BG was over 200 but she has not taken insulin for past 2 weeks.

## 2014-08-06 ENCOUNTER — Telehealth: Payer: Self-pay

## 2014-08-06 NOTE — Telephone Encounter (Signed)
Lantus and humalog are different kinds of insulin. Stick with toujeo. Use coupon card should make 15 dollars. Use same dose of lantus with toujeo.

## 2014-08-06 NOTE — Telephone Encounter (Signed)
Marisa Forbes's husband called and states her insurance will not pay for Lantus so she needs to be switched to Humalog. Please advise.

## 2014-08-06 NOTE — Telephone Encounter (Signed)
Patient is aware. Marisa Forbes, CMA

## 2014-08-10 ENCOUNTER — Ambulatory Visit: Payer: BC Managed Care – PPO | Admitting: Nurse Practitioner

## 2014-08-12 LAB — HM DIABETES EYE EXAM

## 2014-08-13 ENCOUNTER — Other Ambulatory Visit: Payer: Self-pay | Admitting: Nurse Practitioner

## 2014-08-13 DIAGNOSIS — C50212 Malignant neoplasm of upper-inner quadrant of left female breast: Secondary | ICD-10-CM

## 2014-08-14 ENCOUNTER — Other Ambulatory Visit (INDEPENDENT_AMBULATORY_CARE_PROVIDER_SITE_OTHER): Payer: BC Managed Care – PPO

## 2014-08-14 ENCOUNTER — Encounter: Payer: Self-pay | Admitting: Adult Health

## 2014-08-14 ENCOUNTER — Ambulatory Visit (INDEPENDENT_AMBULATORY_CARE_PROVIDER_SITE_OTHER): Payer: BC Managed Care – PPO | Admitting: Physician Assistant

## 2014-08-14 ENCOUNTER — Other Ambulatory Visit: Payer: Self-pay

## 2014-08-14 ENCOUNTER — Telehealth: Payer: Self-pay | Admitting: Internal Medicine

## 2014-08-14 ENCOUNTER — Encounter: Payer: Self-pay | Admitting: Physician Assistant

## 2014-08-14 ENCOUNTER — Other Ambulatory Visit (HOSPITAL_BASED_OUTPATIENT_CLINIC_OR_DEPARTMENT_OTHER): Payer: BC Managed Care – PPO

## 2014-08-14 ENCOUNTER — Telehealth: Payer: Self-pay | Admitting: Adult Health

## 2014-08-14 ENCOUNTER — Ambulatory Visit (HOSPITAL_BASED_OUTPATIENT_CLINIC_OR_DEPARTMENT_OTHER): Payer: BC Managed Care – PPO | Admitting: Adult Health

## 2014-08-14 VITALS — BP 154/91 | HR 86 | Temp 98.5°F | Resp 20 | Ht 65.0 in | Wt 146.5 lb

## 2014-08-14 VITALS — BP 159/92 | HR 105 | Ht 65.0 in | Wt 148.1 lb

## 2014-08-14 DIAGNOSIS — I2510 Atherosclerotic heart disease of native coronary artery without angina pectoris: Secondary | ICD-10-CM

## 2014-08-14 DIAGNOSIS — C50412 Malignant neoplasm of upper-outer quadrant of left female breast: Secondary | ICD-10-CM

## 2014-08-14 DIAGNOSIS — B369 Superficial mycosis, unspecified: Secondary | ICD-10-CM

## 2014-08-14 DIAGNOSIS — I400 Infective myocarditis: Secondary | ICD-10-CM

## 2014-08-14 DIAGNOSIS — C50212 Malignant neoplasm of upper-inner quadrant of left female breast: Secondary | ICD-10-CM

## 2014-08-14 DIAGNOSIS — I519 Heart disease, unspecified: Secondary | ICD-10-CM

## 2014-08-14 DIAGNOSIS — E785 Hyperlipidemia, unspecified: Secondary | ICD-10-CM

## 2014-08-14 DIAGNOSIS — I10 Essential (primary) hypertension: Secondary | ICD-10-CM

## 2014-08-14 DIAGNOSIS — R55 Syncope and collapse: Secondary | ICD-10-CM | POA: Insufficient documentation

## 2014-08-14 DIAGNOSIS — I701 Atherosclerosis of renal artery: Secondary | ICD-10-CM

## 2014-08-14 LAB — CBC WITH DIFFERENTIAL/PLATELET
BASO%: 1.1 % (ref 0.0–2.0)
Basophils Absolute: 0.1 10*3/uL (ref 0.0–0.1)
EOS%: 1.9 % (ref 0.0–7.0)
Eosinophils Absolute: 0.1 10*3/uL (ref 0.0–0.5)
HCT: 47.2 % — ABNORMAL HIGH (ref 34.8–46.6)
HGB: 15.5 g/dL (ref 11.6–15.9)
LYMPH%: 26.4 % (ref 14.0–49.7)
MCH: 28.6 pg (ref 25.1–34.0)
MCHC: 32.9 g/dL (ref 31.5–36.0)
MCV: 86.9 fL (ref 79.5–101.0)
MONO#: 0.4 10*3/uL (ref 0.1–0.9)
MONO%: 8.9 % (ref 0.0–14.0)
NEUT#: 2.9 10*3/uL (ref 1.5–6.5)
NEUT%: 61.7 % (ref 38.4–76.8)
Platelets: 238 10*3/uL (ref 145–400)
RBC: 5.43 10*6/uL (ref 3.70–5.45)
RDW: 13.5 % (ref 11.2–14.5)
WBC: 4.7 10*3/uL (ref 3.9–10.3)
lymph#: 1.2 10*3/uL (ref 0.9–3.3)

## 2014-08-14 LAB — COMPREHENSIVE METABOLIC PANEL (CC13)
ALT: 14 U/L (ref 0–55)
AST: 10 U/L (ref 5–34)
Albumin: 3.5 g/dL (ref 3.5–5.0)
Alkaline Phosphatase: 80 U/L (ref 40–150)
Anion Gap: 7 mEq/L (ref 3–11)
BUN: 9.2 mg/dL (ref 7.0–26.0)
CO2: 27 mEq/L (ref 22–29)
Calcium: 10.1 mg/dL (ref 8.4–10.4)
Chloride: 107 mEq/L (ref 98–109)
Creatinine: 0.7 mg/dL (ref 0.6–1.1)
Glucose: 207 mg/dl — ABNORMAL HIGH (ref 70–140)
Potassium: 4.3 mEq/L (ref 3.5–5.1)
Sodium: 141 mEq/L (ref 136–145)
Total Bilirubin: 0.54 mg/dL (ref 0.20–1.20)
Total Protein: 6.2 g/dL — ABNORMAL LOW (ref 6.4–8.3)

## 2014-08-14 LAB — TSH: TSH: 0.28 u[IU]/mL — ABNORMAL LOW (ref 0.35–4.50)

## 2014-08-14 LAB — T4, FREE: Free T4: 1.21 ng/dL (ref 0.60–1.60)

## 2014-08-14 MED ORDER — METOPROLOL TARTRATE 100 MG PO TABS: 200.0000 mg | ORAL_TABLET | Freq: Two times a day (BID) | ORAL | Status: DC

## 2014-08-14 MED ORDER — ATORVASTATIN CALCIUM 80 MG PO TABS: 80.0000 mg | ORAL_TABLET | Freq: Every day | ORAL | Status: DC

## 2014-08-14 MED ORDER — METOPROLOL TARTRATE 100 MG PO TABS: 100.0000 mg | ORAL_TABLET | Freq: Two times a day (BID) | ORAL | Status: DC

## 2014-08-14 MED ORDER — LISINOPRIL 40 MG PO TABS: 40.0000 mg | ORAL_TABLET | Freq: Every day | ORAL | Status: DC

## 2014-08-14 MED ORDER — ISOSORBIDE MONONITRATE ER 60 MG PO TB24: 60.0000 mg | ORAL_TABLET | Freq: Every day | ORAL | Status: DC

## 2014-08-14 MED ORDER — AMLODIPINE BESYLATE 5 MG PO TABS: 5.0000 mg | ORAL_TABLET | Freq: Two times a day (BID) | ORAL | Status: DC

## 2014-08-14 MED ORDER — KETOCONAZOLE 2 % EX CREA: 1.0000 "application " | TOPICAL_CREAM | Freq: Every day | CUTANEOUS | Status: DC

## 2014-08-14 NOTE — Telephone Encounter (Signed)
Pt request written Rx 90 days supply for Novolog, meformin and lantus in vial(it is cheaper, and [facility] script told her they have it available in vial). Please advise, pt will have to pick this up and mail this into her mail order pharmacy. Pt was wondering if she can come pick up the RX today so she doesn't have to drive back and forth from [facility].

## 2014-08-14 NOTE — Patient Instructions (Signed)
You are doing well.  You have no sign of recurrence.  If you notice any more breast changes, or redness, please contact us at [phone] so we can evaluate you.  I recommend healthy diet, exercise and monthly breast exams.    Breast Self-Awareness Practicing breast self-awareness may pick up problems early, prevent significant medical complications, and possibly save your life. By practicing breast self-awareness, you can become familiar with how your breasts look and feel and if your breasts are changing. This allows you to notice changes early. It can also offer you some reassurance that your breast health is good. One way to learn what is normal for your breasts and whether your breasts are changing is to do a breast self-exam. If you find a lump or something that was not present in the past, it is best to contact your caregiver right away. Other findings that should be evaluated by your caregiver include nipple discharge, especially if it is bloody; skin changes or reddening; areas where the skin seems to be pulled in (retracted); or new lumps and bumps. Breast pain is seldom associated with cancer (malignancy), but should also be evaluated by a caregiver. HOW TO PERFORM A BREAST SELF-EXAM The best time to examine your breasts is 5-7 days after your menstrual period is over. During menstruation, the breasts are lumpier, and it may be more difficult to pick up changes. If you do not menstruate, have reached menopause, or had your uterus removed (hysterectomy), you should examine your breasts at regular intervals, such as monthly. If you are breastfeeding, examine your breasts after a feeding or after using a breast pump. Breast implants do not decrease the risk for lumps or tumors, so continue to perform breast self-exams as recommended. Talk to your caregiver about how to determine the difference between the implant and breast tissue. Also, talk about the amount of pressure you should use during the exam.  Over time, you will become more familiar with the variations of your breasts and more comfortable with the exam. A breast self-exam requires you to remove all your clothes above the waist. 1. Look at your breasts and nipples. Stand in front of a mirror in a room with good lighting. With your hands on your hips, push your hands firmly downward. Look for a difference in shape, contour, and size from one breast to the other (asymmetry). Asymmetry includes puckers, dips, or bumps. Also, look for skin changes, such as reddened or scaly areas on the breasts. Look for nipple changes, such as discharge, dimpling, repositioning, or redness. 2. Carefully feel your breasts. This is best done either in the shower or tub while using soapy water or when flat on your back. Place the arm (on the side of the breast you are examining) above your head. Use the pads (not the fingertips) of your three middle fingers on your opposite hand to feel your breasts. Start in the underarm area and use  inch (2 cm) overlapping circles to feel your breast. Use 3 different levels of pressure (light, medium, and firm pressure) at each circle before moving to the next circle. The light pressure is needed to feel the tissue closest to the skin. The medium pressure will help to feel breast tissue a little deeper, while the firm pressure is needed to feel the tissue close to the ribs. Continue the overlapping circles, moving downward over the breast until you feel your ribs below your breast. Then, move one finger-width towards the center of the body.  Continue to use the  inch (2 cm) overlapping circles to feel your breast as you move slowly up toward the collar bone (clavicle) near the base of the neck. Continue the up and down exam using all 3 pressures until you reach the middle of the chest. Do this with each breast, carefully feeling for lumps or changes. 3.  Keep a written record with breast changes or normal findings for each breast. By  writing this information down, you do not need to depend only on memory for size, tenderness, or location. Write down where you are in your menstrual cycle, if you are still menstruating. Breast tissue can have some lumps or thick tissue. However, see your caregiver if you find anything that concerns you.  SEEK MEDICAL CARE IF:  You see a change in shape, contour, or size of your breasts or nipples.   You see skin changes, such as reddened or scaly areas on the breasts or nipples.   You have an unusual discharge from your nipples.   You feel a new lump or unusually thick areas.  Document Released: 10/16/2005 Document Revised: 10/02/2012 Document Reviewed: 01/31/2012 [facility] Patient Information 2015 [place], [place]. This information is not intended to replace advice given to you by your health care provider. Make sure you discuss any questions you have with your health care provider.

## 2014-08-14 NOTE — Telephone Encounter (Signed)
appt is set 08/19/14.

## 2014-08-14 NOTE — Telephone Encounter (Signed)
, °

## 2014-08-14 NOTE — Telephone Encounter (Signed)
She needs to be seen.

## 2014-08-14 NOTE — Progress Notes (Signed)
Hematology and Oncology Follow Up Visit  Marisa Forbes [id number] [birth year] 63 y.o. 08/14/2014 10:43 AM     Principal Diagnosis:Marisa Forbes 63 y.o. 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     Prior Therapy: #1in January 2014 patient was found to have a suspicious area on screening mammography in the upper outer quadrant of the left breast. She ultimately underwent biopsy of this area which revealed invasive ductal carcinoma. the tumor was estrogen and progesterone receptor negative as well as HER-2/neu negative MRI revealed a solitary mass measuring 1.6 x 1.5 x 1.5 cm in size. Patient was seen in the multidisciplinary breast clinic.   #2patient is status post left lumpectomy with sentinel lymph node biopsies. Her final pathology reveals a 1.4 cm invasive ductal carcinoma grade 3, ER negative PR negative HER-2/neu negative with Ki-67 54%. Sentinel lymph nodes were negative for metastatic disease.   #3 s/p adjuvant chemotherapy consisting of CMF. She started CMF on 02/20/13. She underwent 6 cycles of this and completed therapy on 06/05/13.   #4 patient underwent radiation therapy with Dr. Sondra Come from 06/23/13-08/08/13  Current therapy:  observation  Interim History: Marisa Forbes 63 y.o. female with h/o triple negative invasive ductal carcinoma of the left breast.  She is here for follow up and monitoring today.  She continues to do well.  She did have one episode last week where she woke up and had a numb feeling in her left breast and it was beet red.  This lasted for three days and then resolved.  She does have an occasional numb sensation in the medial upper portion of her left breast.  She says that it is possible that it has always been like this and she had heightened sensitivity due to the redness.  She does have longstanding numbness underneath her left arm since her surgery.  She has redness underneath her breasts  that is chronic and usually resolves every winter.  Otherwise she denies new pain, night sweats, weight loss, fevers, chills, bowel/bladder changes, headaches, weakness, numbness, vision changes, or any further concerns.  We updated her health maintenance below.     Medications:  Current Outpatient Prescriptions  Medication Sig Dispense Refill  . amLODipine (NORVASC) 5 MG tablet Take 5 mg by mouth 2 (two) times daily.      Marland Kitchen aspirin EC 81 MG EC tablet Take 1 tablet (81 mg total) by mouth daily.      Marland Kitchen atorvastatin (LIPITOR) 80 MG tablet Take 80 mg by mouth daily with supper.      Marland Kitchen glucose blood (ONETOUCH VERIO) test strip Use TID  100 each  12  . ibuprofen (ADVIL,MOTRIN) 200 MG tablet Take 200 mg by mouth every 6 (six) hours as needed for mild pain or moderate pain. For pain      . insulin aspart (NOVOLOG) 100 UNIT/ML FlexPen Inject 10 Units into the skin 3 (three) times daily with meals.  15 mL  0  . insulin aspart (NOVOLOG) 100 UNIT/ML injection Inject 10 Units into the skin 3 (three) times daily with meals.  10 mL  11  . Insulin Glargine (LANTUS SOLOSTAR) 100 UNIT/ML Solostar Pen Inject 31 units [place] at bedtime.  1 pen  0  . insulin glargine (LANTUS) 100 UNIT/ML injection Inject 0.31 mLs (31 Units total) into the skin at bedtime.  9 mL  12  . isosorbide mononitrate (IMDUR) 60 MG 24 hr tablet Take 60 mg  by mouth daily.      Marland Kitchen lisinopril (PRINIVIL,ZESTRIL) 40 MG tablet Take 40 mg by mouth daily with breakfast.      . metFORMIN (GLUCOPHAGE XR) 750 MG 24 hr tablet Take 1 tablet (750 mg total) by mouth daily with breakfast.  30 tablet  0  . metFORMIN (GLUCOPHAGE-XR) 750 MG 24 hr tablet Take 1 tablet (750 mg total) by mouth daily with breakfast.  90 tablet  1  . metoprolol (LOPRESSOR) 50 MG tablet Take 3 tablets (150 mg total) by mouth 2 (two) times daily.  180 tablet  6  . nitroGLYCERIN (NITROSTAT) 0.4 MG SL tablet Place 0.4 mg under the tongue every 5 (five) minutes as needed for chest pain.        No current facility-administered medications for this visit.     Allergies: No Known Allergies  Medical History: Past Medical History  Diagnosis Date  . HTN (hypertension)   . Diabetes mellitus without complication   . Myocardial infarction 09/07/12  . Breast cancer 12/03/12    left  . History of radiation therapy 06/23/2013-08/08/2013    62.4 gray to left breast  . Hypercholesteremia   . Coronary artery disease   . Stroke, embolic July 6767  . Peripheral vision loss     left  . Balance problems     due to stroke  . History of cancer chemotherapy     last in August 2014  . GERD (gastroesophageal reflux disease)     food related  . Arthritis     back    Surgical History:  Past Surgical History  Procedure Laterality Date  . Coronary angioplasty with stent placement    . Breast lumpectomy with needle localization and axillary sentinel lymph node bx Left 01/08/2013    Procedure: LEFT BREAST WIRE GUIDED  LUMPECTOMY AND LEFT AXILLARY SENTINEL  NODE BX;  Surgeon: Rolm Bookbinder, MD;  Location: [facility];  Service: General;  Laterality: Left;  . Portacath placement Right 02/17/2013    Procedure: INSERTION PORT-A-CATH;  Surgeon: Rolm Bookbinder, MD;  Location: [facility];  Service: General;  Laterality: Right;  . Back surgery      x3, lower back  . Cataract extraction Bilateral   . Port-a-cath removal N/A 10/14/2013    Procedure: REMOVAL PORT-A-CATH;  Surgeon: Rolm Bookbinder, MD;  Location: [facility];  Service: General;  Laterality: N/A;     Review of Systems: A 10 point review of systems was conducted and is otherwise negative except for what is noted above.    Health Maintenance  Mammogram:  02/11/14 Colonoscopy:  Never, referred by PCP Bone Density Scan:  never Pap Smear: due, will have done at next PCP appt Eye Exam: 07/2013 Vitamin D Level: 05/2013 Lipid Panel: 05/2013   Physical Exam: Blood pressure 154/91, pulse 86, temperature 98.5 F (36.9 C), temperature source  Oral, resp. rate 20, height 5' 5" (1.651 m), weight 146 lb 8 oz (66.452 kg). GENERAL: Patient is a well appearing female in no acute distress HEENT:  Sclerae anicteric.  Oropharynx clear and moist. No ulcerations or evidence of oropharyngeal candidiasis. Neck is supple.  NODES:  No cervical, supraclavicular, or axillary lymphadenopathy palpated.  BREAST EXAM:  Left breast s/p lumpectomy, no nodularity, nodules or masses.  Radiation changes noted in the left breast.  Right breast without nodules, masses, skin changes, or nipple changes.  Fungal rash noted underneath breasts.  Benign bilateral breast exam.  LUNGS:  Clear to auscultation bilaterally.  No wheezes or  rhonchi. HEART:  Regular rate and rhythm. No murmur appreciated. ABDOMEN:  Soft, nontender.  Positive, normoactive bowel sounds. No organomegaly palpated. MSK:  No focal spinal tenderness to palpation. Full range of motion bilaterally in the upper extremities. EXTREMITIES:  No peripheral edema.   SKIN:  Clear with no obvious rashes or skin changes. No nail dyscrasia. NEURO:  Nonfocal. Well oriented.  Appropriate affect. ECOG PERFORMANCE STATUS: 0 - Asymptomatic   Lab Results: Lab Results  Component Value Date   WBC 4.7 08/14/2014   HGB 15.5 08/14/2014   HCT 47.2* 08/14/2014   MCV 86.9 08/14/2014   PLT 238 08/14/2014     Chemistry      Component Value Date/Time   NA 137 02/11/2014 1112   NA 138 10/10/2013 1100   K 4.4 02/11/2014 1112   K 3.9 10/10/2013 1100   CL 102 10/10/2013 1100   CL 110* 04/10/2013 1020   CO2 24 02/11/2014 1112   CO2 24 10/10/2013 1100   BUN 13.0 02/11/2014 1112   BUN 12 10/10/2013 1100   CREATININE 0.8 02/11/2014 1112   CREATININE 0.50 10/10/2013 1100      Component Value Date/Time   CALCIUM 9.8 02/11/2014 1112   CALCIUM 9.8 10/10/2013 1100   ALKPHOS 106 02/11/2014 1112   ALKPHOS 91 06/26/2013 0845   AST 17 02/11/2014 1112   AST 22 06/26/2013 0845   ALT 27 02/11/2014 1112   ALT 28 06/26/2013 0845    BILITOT 0.50 02/11/2014 1112   BILITOT 0.7 06/26/2013 0845     Assessment and Plan: Marisa Forbes 63 y.o. female with  1. History of triple negative invasive ductal carcinoma of the left breast.  See prior history above.   Jyrah has no sign of recurrence.  Her CBC is normal and I reviewed this with her in detail.  A CMP is pending.  Her breast erythema has resolved.  She knows to call us and come in if it happens again.   2. Survivorship: We reviewed her health maintenance above.  She will f/u with her PCP regarding colonoscopy, bone density and pap smear scheduling.  I recommended healthy diet, exercise and monthly breast exams.    3. Fungal rash underneath breasts: I prescribed Ketoconazole cream for this to apply underneath her breasts daily.    The patient will return in 6 months for labs and evaluation.   She knows to call us in the interim for any questions or concerns.  We can certainly see her sooner if needed.  I spent 25 minutes counseling the patient face to face.  The total time spent in the appointment was 30 minutes.  Minette Headland, [place] [phone] 08/14/2014 10:43 AM

## 2014-08-14 NOTE — Progress Notes (Signed)
[place], [address] [place], [place]  [address] Phone: [phone] Fax:  [phone]  Date:  08/14/2014   Patient ID:  Marisa, Forbes 12/01/1950, MRN [id number]   PCP:  Scarlette Calico, MD  Cardiologist:  Dr. Harrington Challenger  History of Present Illness: Marisa Forbes is a 63 y.o. female with history of CAD (STEMI 08/2012 s/p DES to LAD and PTCA to distal LAD, residual RCA/Diag disease treated medically), post-cath stroke, LV dysfunction EF 45-50%, HTN, DM, HLD, breast CA tx with lumpectomy/chemo/radiation who presents for follow-up. During her admission for STEMI back in 2013, she had required re-look cath for recurrent pain. This showed stable post-PCI anatomy with diffuse distal LAD disease and suggestion of small dissection distal to tiny D2, small caliber D1 with prox and mid 70-80% lesions (unsuitable for PCI), 50% mid RCA and 70% pre PDA lesions, stable. Ranexa was prescribed but she could not afford this. Brilinta was stopped by Dr. Harrington Challenger in 09/2013. The patient has not required repeat ischemic eval since that time.  She is doing very well. She got a new job at [employer] with a great work environment and great benefits. She is really happy. She denies any CP, SOB, LEE, orthopnea, syncope, bleeding issues. She does not drink excessive caffeine or use any OTC supplements. No substance use. Labs from heme-onc today reveal normal renal function and Hgb 15.5.  Recent Labs: 08/14/2014: ALT 14; Creatinine 0.7; Hemoglobin 15.5; Potassium 4.3  2D Echo 04/2013 Study Conclusions Left ventricle: The cavity size was normal. There was mild focal basal hypertrophy of the septum. Systolic function was mildly reduced. The estimated ejection fraction was in the range of 45% to 50%. There is hypokinesis of the anteroseptal myocardium. Doppler parameters are consistent with abnormal left ventricular relaxation (grade 1 diastolic dysfunction). Impressions: - Study placed in [place] to read box at 2:30 PM  05/19/13.  Wt Readings from Last 3 Encounters:  08/14/14 148 lb 1.9 oz (67.187 kg)  08/14/14 146 lb 8 oz (66.452 kg)  08/04/14 140 lb (63.504 kg)     Past Medical History  Diagnosis Date  . HTN (hypertension)   . Diabetes mellitus without complication   . Myocardial infarction 09/07/12  . Breast cancer 12/03/12    a. 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 S/p chemo with CMF and radiation.  Marland Kitchen History of radiation therapy 06/23/2013-08/08/2013    62.4 gray to left breast  . Hypercholesteremia   . Coronary artery disease     a. STEMI 08/2012: s/p DES to LAD, PTCA to downstream LAD. b. Relook cath same hospitalization: stable post-PCI anatomy with Stable Diag lesions (unlikely cause of resting angina, not good PCI targets), stable RCA lesion (non flow limiting).  . Stroke, embolic     a. 70/6237 post cath.  . Peripheral vision loss     left  . Balance problems     due to stroke  . History of cancer chemotherapy     last in August 2014  . GERD (gastroesophageal reflux disease)     food related  . Arthritis     back  . Renal artery stenosis     a. 20% L RAS in 08/2012.  . LV dysfunction     a. EF 45-50% at time of STEMI 08/2012. b. Echo 04/2013: EF 45-50%, mild focal basal hypertrophy of septum, grade 1 d/d.  Marland Kitchen Vasovagal syncope     a. 04/2013 - echo stable compared  to prior EF 45-50%, negative carotid duplex.    Current Outpatient Prescriptions  Medication Sig Dispense Refill  . amLODipine (NORVASC) 5 MG tablet Take 5 mg by mouth 2 (two) times daily.      Marland Kitchen aspirin EC 81 MG EC tablet Take 1 tablet (81 mg total) by mouth daily.      Marland Kitchen atorvastatin (LIPITOR) 80 MG tablet Take 80 mg by mouth daily with supper.      Marland Kitchen glucose blood (ONETOUCH VERIO) test strip Use TID  100 each  12  . ibuprofen (ADVIL,MOTRIN) 200 MG tablet Take 200 mg by mouth every 6 (six) hours as needed for mild pain or moderate pain. For pain      . insulin aspart (NOVOLOG) 100  UNIT/ML FlexPen Inject 10 Units into the skin 3 (three) times daily with meals.  15 mL  0  . Insulin Glargine (LANTUS SOLOSTAR) 100 UNIT/ML Solostar Pen Inject 31 units [place] at bedtime.  1 pen  0  . isosorbide mononitrate (IMDUR) 60 MG 24 hr tablet Take 60 mg by mouth daily.      Marland Kitchen ketoconazole (NIZORAL) 2 % cream Apply 1 application topically daily.  30 g  0  . lisinopril (PRINIVIL,ZESTRIL) 40 MG tablet Take 40 mg by mouth daily with breakfast.      . metFORMIN (GLUCOPHAGE XR) 750 MG 24 hr tablet Take 1 tablet (750 mg total) by mouth daily with breakfast.  30 tablet  0  . metoprolol (LOPRESSOR) 50 MG tablet Take 3 tablets (150 mg total) by mouth 2 (two) times daily.  180 tablet  6  . nitroGLYCERIN (NITROSTAT) 0.4 MG SL tablet Place 0.4 mg under the tongue every 5 (five) minutes as needed for chest pain.       No current facility-administered medications for this visit.    Allergies:   Review of patient's allergies indicates no known allergies.   Social History:  The patient  reports that she has never smoked. She has never used smokeless tobacco. She reports that she does not drink alcohol or use illicit drugs.   Family History:  The patient's family history is negative for Cancer, Diabetes, Hyperlipidemia, Hypertension, Kidney disease, and Stroke.   ROS:  Please see the history of present illness.   All other systems reviewed and negative.   PHYSICAL EXAM: VS:  BP 159/92  Pulse 105  Ht 5\' 5"  (1.651 m)  Wt 148 lb 1.9 oz (67.187 kg)  BMI 24.65 kg/m2 - recheck 160/100, pulse ox 96% RA Well nourished, well developed WF, in no acute distress HEENT: normal Neck: no JVD Cardiac:  normal S1, S2; RRR except borderline tachycardic; no murmur Lungs:  clear to auscultation bilaterally, no wheezing, rhonchi or rales- she is not SOB, hypoxic or tachypnic. Abd: soft, nontender, no hepatomegaly Ext: no edema Skin: warm and dry Neuro:  moves all extremities spontaneously, no focal abnormalities  noted  EKG:  Sinus tach 105bpm, left axis devation, prior inferior and anterolateral infarct, TW flattening avL, not significantly changed from prior  ASSESSMENT AND PLAN:  1. CAD - no recent angina. Feeling great. Continue aspirin, statin, BB, Imdur. 2. HTN - BP has been running higher recently per review of chart. HR is elevated as well but she is asymptomatic. She was not anemic today by oncology labs. Will ask her to make sure to keep hydrated. Her HR was elevated when she saw Dr. Harrington Challenger in April but it was 27 earlier today at the heme-onc clinic. It's  not clear why BP remains so high despite multiple meds. Auscultated HR sounds about 95 right now. Will start with TSH, renin/aldosterone level, and renal artery duplex (since she has h/o mild RAS in 2013). She is not really exhibiting signs of pheochromocytoma or Cushing's so will hold off on evaluation for these pending outcome of her initial testing. I will increase her metoprolol to 200mg  BID. She will be using up her 50mg  tablets first then transition to 100mg  tablets (2 tabs twice a day). I think the next step would be adding spironolactone or Lasix if needed, but prefer to get labwork first before adding new meds. She has an NP at her job that can check her BP/HR regularly - she will check it daily for 1 week and call us with those readings. 3. LV dysfunction - no evidence of decompensated CHF. Continue BB, ACEI. 4. Hyperlipidemia - continue statin. CMET from today showed normal LFTs. We can consider f/u FLP in April 2015.  Dispo: F/u 6 weeks with Dr. Harrington Challenger or APP to reassess BP and HR in person.  Signed, Melina Copa, PA-C  08/14/2014 1:27 PM

## 2014-08-14 NOTE — Patient Instructions (Addendum)
Your physician has recommended you make the following change in your medication:    START TAKING METOPROLOL 100 MG TWICE A DAY   Your physician has requested that you have a renal artery duplex. During this test, an ultrasound is used to evaluate blood flow to the kidneys. Allow one hour for this exam. Do not eat after midnight the day before and avoid carbonated beverages. Take your medications as you usually do.   Your physician recommends that you return for lab work  TODAY  TSH    Your physician recommends that you schedule a follow-up appointment in:  IN [place] BLOOD PRESSURE AND HEAR RATE DAILY AND CALL us WITH RESULTS IN [place]

## 2014-08-17 ENCOUNTER — Telehealth: Payer: Self-pay | Admitting: Internal Medicine

## 2014-08-17 ENCOUNTER — Telehealth: Payer: Self-pay | Admitting: *Deleted

## 2014-08-17 DIAGNOSIS — I10 Essential (primary) hypertension: Secondary | ICD-10-CM

## 2014-08-17 DIAGNOSIS — I639 Cerebral infarction, unspecified: Secondary | ICD-10-CM

## 2014-08-17 NOTE — Addendum Note (Signed)
Addended by: Michae Kava on: 08/17/2014 04:42 PM   Modules accepted: Orders

## 2014-08-17 NOTE — Telephone Encounter (Signed)
New Message      Pt calling stating that Marisa Forbes spoke with her earlier about getting blood work done. Pt states she received a call from [facility] in [facility] and she does not wish to be seen there. Pt wants to be seen [facility] in [place]. Pt is needing for the order to be sent to [facility] instead of [facility]. Please call pt to advise.

## 2014-08-17 NOTE — Telephone Encounter (Signed)
I tried tcb pt to let her know that the lab order was called into the [facility] location for 10/29. I will try later to reach pt. Lmom that pt will need to go to [facility] in [place] on 10/29 so they can print her lab order off and she will then have to go downstairs to [facility] [employer] to have lab work done since they do not draw blood in the office.

## 2014-08-17 NOTE — Telephone Encounter (Signed)
lmptcb for lab results 

## 2014-08-17 NOTE — Telephone Encounter (Signed)
pt notified about lab results and will have repeat TSH at PCP in [place] on 10/29.

## 2014-08-18 ENCOUNTER — Ambulatory Visit (HOSPITAL_COMMUNITY): Payer: BC Managed Care – PPO | Attending: Physician Assistant | Admitting: *Deleted

## 2014-08-18 DIAGNOSIS — I2510 Atherosclerotic heart disease of native coronary artery without angina pectoris: Secondary | ICD-10-CM | POA: Diagnosis not present

## 2014-08-18 DIAGNOSIS — E785 Hyperlipidemia, unspecified: Secondary | ICD-10-CM | POA: Diagnosis not present

## 2014-08-18 DIAGNOSIS — I10 Essential (primary) hypertension: Secondary | ICD-10-CM | POA: Diagnosis present

## 2014-08-18 DIAGNOSIS — E119 Type 2 diabetes mellitus without complications: Secondary | ICD-10-CM | POA: Diagnosis not present

## 2014-08-18 DIAGNOSIS — I701 Atherosclerosis of renal artery: Secondary | ICD-10-CM

## 2014-08-18 NOTE — Progress Notes (Signed)
Renal Artery Duplex Performed

## 2014-08-19 ENCOUNTER — Encounter (HOSPITAL_COMMUNITY): Payer: BC Managed Care – PPO

## 2014-08-19 ENCOUNTER — Encounter: Payer: Self-pay | Admitting: Physician Assistant

## 2014-08-19 ENCOUNTER — Ambulatory Visit: Payer: BC Managed Care – PPO | Admitting: Internal Medicine

## 2014-08-19 DIAGNOSIS — I701 Atherosclerosis of renal artery: Secondary | ICD-10-CM | POA: Insufficient documentation

## 2014-08-20 ENCOUNTER — Telehealth: Payer: Self-pay | Admitting: *Deleted

## 2014-08-20 LAB — ALDOSTERONE + RENIN ACTIVITY W/ RATIO
ALDO / PRA Ratio: 2.6 Ratio (ref 0.9–28.9)
Aldosterone: 5 ng/dL
PRA LC/MS/MS: 1.94 ng/mL/h (ref 0.25–5.82)

## 2014-08-20 NOTE — Telephone Encounter (Signed)
lmptcb for renal doppler results

## 2014-08-20 NOTE — Telephone Encounter (Signed)
Rtnd pt's call now. Lmptcb for renal doppler results

## 2014-08-20 NOTE — Telephone Encounter (Signed)
Follow up ° °Pt returned call  °

## 2014-08-20 NOTE — ED Provider Notes (Signed)
Agree with exam, assessment, and plan.   Kandra Nicolas, MD 08/20/14 [phone]

## 2014-08-21 NOTE — Telephone Encounter (Signed)
lmptcb for renal doppler results and recommendations per Melina Copa, PA

## 2014-08-21 NOTE — Telephone Encounter (Signed)
ptcb and has been notified about renal doppler results and recommendations. Pt aware waiting to see what repeat TSH will show before moving forward. Pt verbalized understanding to Plan of care

## 2014-08-24 ENCOUNTER — Other Ambulatory Visit: Payer: Self-pay | Admitting: *Deleted

## 2014-08-24 ENCOUNTER — Telehealth: Payer: Self-pay | Admitting: *Deleted

## 2014-08-24 DIAGNOSIS — I701 Atherosclerosis of renal artery: Secondary | ICD-10-CM

## 2014-08-24 DIAGNOSIS — I10 Essential (primary) hypertension: Secondary | ICD-10-CM

## 2014-08-24 MED ORDER — HYDROCHLOROTHIAZIDE 25 MG PO TABS: 25.0000 mg | ORAL_TABLET | Freq: Every day | ORAL | Status: DC

## 2014-08-24 NOTE — Telephone Encounter (Signed)
s/w pt today about normal aldosertone level, referral to Dr. Gwenlyn Found for [facility] consult, start HCTZ 25 mg daily today, BMET 10/30 lab will be done at [facility] in [place] at [place] per pt request. Pt states she she has had 2 "episodes" where she had chills for about 4 hours and the next day her L breast was red hot. Pt is a breast cancer survivor and states her radiation finished about 1 year ago. Pt states she told Mendel Ryder at the Cancer center which Mendel Ryder looked at breast but by then nothing was there. I advised pt to let oncologist know that this has happend x 2  Now and probably needs to be looked at. Pt verbalized understanding to Plan of care. I told pt that our office will call her with a date and time for Dr. Gwenlyn Found. Pt said please leave a detailed message on her cell phone since she cannot take calls while at work and only has 15 minutes for break.

## 2014-08-24 NOTE — Telephone Encounter (Signed)
I faxed over lab orders to [facility] in [place] to fax [phone] for pt to have lab on 10/30 with results to be faxed to [facility], [place].

## 2014-08-27 ENCOUNTER — Other Ambulatory Visit: Payer: BC Managed Care – PPO

## 2014-08-29 ENCOUNTER — Other Ambulatory Visit: Payer: Self-pay | Admitting: Physician Assistant

## 2014-09-15 ENCOUNTER — Telehealth: Payer: Self-pay | Admitting: Internal Medicine

## 2014-09-15 NOTE — Telephone Encounter (Signed)
New message  Pt requests a call back to discuss if the appt for 11/20 was needed also pt has taken a repeat lab with her employer and would like to email the results. Is there an email that she can have her results emailed to. Please call back to discuss.

## 2014-09-15 NOTE — Telephone Encounter (Signed)
Left pt a message to call back. 

## 2014-09-17 NOTE — Telephone Encounter (Signed)
I spoke with the patient . I advised her that the appointment for tomorrow was for a follow up BP/ HR check. She is unable to come to that appointment. She is able to check her BP/ HR at home. I have asked that she check these readings at least 30 minutes- 1 hour after her AM medications and record the readings. I have advised she may check it again in the evening, but should not take it more than twice daily. I have also asked that she call with these readings in about a week for Dr. Harrington Challenger to review. She was also given our fax # and mailing address to forward her lab work to Dr. Harrington Challenger.

## 2014-09-18 ENCOUNTER — Ambulatory Visit: Payer: BC Managed Care – PPO | Admitting: Internal Medicine

## 2014-10-01 ENCOUNTER — Telehealth: Payer: Self-pay | Admitting: Hematology

## 2014-10-01 NOTE — Telephone Encounter (Signed)
, °

## 2014-10-08 ENCOUNTER — Encounter (HOSPITAL_COMMUNITY): Payer: Self-pay | Admitting: Cardiovascular Disease

## 2014-11-03 ENCOUNTER — Ambulatory Visit: Payer: BC Managed Care – PPO | Admitting: Cardiovascular Disease

## 2014-12-24 ENCOUNTER — Other Ambulatory Visit: Payer: Self-pay | Admitting: Nurse Practitioner

## 2014-12-30 ENCOUNTER — Ambulatory Visit: Payer: BC Managed Care – PPO | Admitting: Cardiovascular Disease

## 2015-01-07 ENCOUNTER — Encounter: Payer: Self-pay | Admitting: Cardiovascular Disease

## 2015-02-10 ENCOUNTER — Telehealth: Payer: Self-pay | Admitting: Hematology

## 2015-02-10 NOTE — Telephone Encounter (Signed)
returned call and lvm confirm 4.14 appt cx per pt request....advised to call back to r/s

## 2015-02-11 ENCOUNTER — Other Ambulatory Visit: Payer: BC Managed Care – PPO

## 2015-02-11 ENCOUNTER — Ambulatory Visit: Payer: BC Managed Care – PPO | Admitting: Hematology

## 2015-02-12 ENCOUNTER — Other Ambulatory Visit: Payer: BC Managed Care – PPO

## 2015-02-12 ENCOUNTER — Ambulatory Visit: Payer: BC Managed Care – PPO | Admitting: Adult Health

## 2015-04-11 ENCOUNTER — Inpatient Hospital Stay (HOSPITAL_COMMUNITY): Payer: BLUE CROSS/BLUE SHIELD

## 2015-04-11 ENCOUNTER — Inpatient Hospital Stay (HOSPITAL_COMMUNITY)
Admission: EM | Admit: 2015-04-11 | Discharge: 2015-04-16 | DRG: 069 | Disposition: A | Discharge status: Home / Self Care | Payer: BLUE CROSS/BLUE SHIELD | Attending: Internal Medicine | Admitting: Internal Medicine

## 2015-04-11 ENCOUNTER — Encounter (HOSPITAL_COMMUNITY): Payer: Self-pay | Admitting: Emergency Medicine

## 2015-04-11 ENCOUNTER — Emergency Department (HOSPITAL_COMMUNITY): Payer: BLUE CROSS/BLUE SHIELD

## 2015-04-11 DIAGNOSIS — R2681 Unsteadiness on feet: Secondary | ICD-10-CM | POA: Diagnosis present

## 2015-04-11 DIAGNOSIS — I255 Ischemic cardiomyopathy: Secondary | ICD-10-CM | POA: Diagnosis present

## 2015-04-11 DIAGNOSIS — Z794 Long term (current) use of insulin: Secondary | ICD-10-CM | POA: Diagnosis not present

## 2015-04-11 DIAGNOSIS — Z7982 Long term (current) use of aspirin: Secondary | ICD-10-CM

## 2015-04-11 DIAGNOSIS — E785 Hyperlipidemia, unspecified: Secondary | ICD-10-CM | POA: Insufficient documentation

## 2015-04-11 DIAGNOSIS — H5462 Unqualified visual loss, left eye, normal vision right eye: Secondary | ICD-10-CM | POA: Diagnosis present

## 2015-04-11 DIAGNOSIS — H4902 Third [oculomotor] nerve palsy, left eye: Secondary | ICD-10-CM | POA: Diagnosis not present

## 2015-04-11 DIAGNOSIS — K219 Gastro-esophageal reflux disease without esophagitis: Secondary | ICD-10-CM | POA: Diagnosis present

## 2015-04-11 DIAGNOSIS — I6359 Cerebral infarction due to unspecified occlusion or stenosis of other cerebral artery: Secondary | ICD-10-CM | POA: Diagnosis not present

## 2015-04-11 DIAGNOSIS — I160 Hypertensive urgency: Secondary | ICD-10-CM

## 2015-04-11 DIAGNOSIS — Z9114 Patient's other noncompliance with medication regimen: Secondary | ICD-10-CM | POA: Diagnosis present

## 2015-04-11 DIAGNOSIS — Z961 Presence of intraocular lens: Secondary | ICD-10-CM | POA: Diagnosis present

## 2015-04-11 DIAGNOSIS — Z923 Personal history of irradiation: Secondary | ICD-10-CM | POA: Diagnosis not present

## 2015-04-11 DIAGNOSIS — I429 Cardiomyopathy, unspecified: Secondary | ICD-10-CM | POA: Diagnosis not present

## 2015-04-11 DIAGNOSIS — N390 Urinary tract infection, site not specified: Secondary | ICD-10-CM | POA: Diagnosis present

## 2015-04-11 DIAGNOSIS — E119 Type 2 diabetes mellitus without complications: Secondary | ICD-10-CM

## 2015-04-11 DIAGNOSIS — Z9841 Cataract extraction status, right eye: Secondary | ICD-10-CM | POA: Diagnosis not present

## 2015-04-11 DIAGNOSIS — Z955 Presence of coronary angioplasty implant and graft: Secondary | ICD-10-CM

## 2015-04-11 DIAGNOSIS — I5022 Chronic systolic (congestive) heart failure: Secondary | ICD-10-CM | POA: Diagnosis present

## 2015-04-11 DIAGNOSIS — H532 Diplopia: Secondary | ICD-10-CM | POA: Diagnosis present

## 2015-04-11 DIAGNOSIS — M1990 Unspecified osteoarthritis, unspecified site: Secondary | ICD-10-CM | POA: Diagnosis present

## 2015-04-11 DIAGNOSIS — I639 Cerebral infarction, unspecified: Secondary | ICD-10-CM | POA: Diagnosis not present

## 2015-04-11 DIAGNOSIS — I2510 Atherosclerotic heart disease of native coronary artery without angina pectoris: Secondary | ICD-10-CM | POA: Diagnosis present

## 2015-04-11 DIAGNOSIS — Z9221 Personal history of antineoplastic chemotherapy: Secondary | ICD-10-CM | POA: Diagnosis not present

## 2015-04-11 DIAGNOSIS — I63432 Cerebral infarction due to embolism of left posterior cerebral artery: Secondary | ICD-10-CM | POA: Diagnosis not present

## 2015-04-11 DIAGNOSIS — R270 Ataxia, unspecified: Secondary | ICD-10-CM

## 2015-04-11 DIAGNOSIS — I10 Essential (primary) hypertension: Secondary | ICD-10-CM | POA: Diagnosis present

## 2015-04-11 DIAGNOSIS — B9620 Unspecified Escherichia coli [E. coli] as the cause of diseases classified elsewhere: Secondary | ICD-10-CM | POA: Diagnosis present

## 2015-04-11 DIAGNOSIS — Z9842 Cataract extraction status, left eye: Secondary | ICD-10-CM

## 2015-04-11 DIAGNOSIS — E1165 Type 2 diabetes mellitus with hyperglycemia: Secondary | ICD-10-CM | POA: Diagnosis present

## 2015-04-11 DIAGNOSIS — Z853 Personal history of malignant neoplasm of breast: Secondary | ICD-10-CM

## 2015-04-11 DIAGNOSIS — R739 Hyperglycemia, unspecified: Secondary | ICD-10-CM

## 2015-04-11 DIAGNOSIS — Z79899 Other long term (current) drug therapy: Secondary | ICD-10-CM

## 2015-04-11 DIAGNOSIS — E1149 Type 2 diabetes mellitus with other diabetic neurological complication: Secondary | ICD-10-CM | POA: Diagnosis not present

## 2015-04-11 DIAGNOSIS — Z791 Long term (current) use of non-steroidal anti-inflammatories (NSAID): Secondary | ICD-10-CM | POA: Diagnosis not present

## 2015-04-11 DIAGNOSIS — I110 Hypertensive heart disease with heart failure: Secondary | ICD-10-CM | POA: Diagnosis present

## 2015-04-11 DIAGNOSIS — G459 Transient cerebral ischemic attack, unspecified: Principal | ICD-10-CM | POA: Diagnosis present

## 2015-04-11 DIAGNOSIS — I420 Dilated cardiomyopathy: Secondary | ICD-10-CM | POA: Diagnosis not present

## 2015-04-11 DIAGNOSIS — I252 Old myocardial infarction: Secondary | ICD-10-CM | POA: Diagnosis not present

## 2015-04-11 DIAGNOSIS — H4900 Third [oculomotor] nerve palsy, unspecified eye: Secondary | ICD-10-CM

## 2015-04-11 DIAGNOSIS — I638 Other cerebral infarction: Secondary | ICD-10-CM | POA: Diagnosis not present

## 2015-04-11 DIAGNOSIS — E1159 Type 2 diabetes mellitus with other circulatory complications: Secondary | ICD-10-CM | POA: Insufficient documentation

## 2015-04-11 LAB — COMPREHENSIVE METABOLIC PANEL
ALT: 15 U/L (ref 14–54)
AST: 15 U/L (ref 15–41)
Albumin: 4.3 g/dL (ref 3.5–5.0)
Alkaline Phosphatase: 98 U/L (ref 38–126)
Anion gap: 11 (ref 5–15)
BUN: 14 mg/dL (ref 6–20)
CO2: 21 mmol/L — ABNORMAL LOW (ref 22–32)
Calcium: 9.4 mg/dL (ref 8.9–10.3)
Chloride: 103 mmol/L (ref 101–111)
Creatinine, Ser: 0.5 mg/dL (ref 0.44–1.00)
GFR calc Af Amer: >60 mL/min (ref >60)
GFR calc non Af Amer: >60 mL/min (ref >60)
Glucose, Bld: 390 mg/dL — ABNORMAL HIGH (ref 65–99)
Potassium: 4 mmol/L (ref 3.5–5.1)
Sodium: 135 mmol/L (ref 135–145)
Total Bilirubin: 0.7 mg/dL (ref 0.3–1.2)
Total Protein: 7 g/dL (ref 6.5–8.1)

## 2015-04-11 LAB — RAPID URINE DRUG SCREEN, HOSP PERFORMED
Amphetamines: NOT DETECTED
Barbiturates: NOT DETECTED
Benzodiazepines: NOT DETECTED
Cocaine: NOT DETECTED
Opiates: NOT DETECTED
Tetrahydrocannabinol: NOT DETECTED

## 2015-04-11 LAB — URINALYSIS, ROUTINE W REFLEX MICROSCOPIC
Bilirubin Urine: NEGATIVE
Glucose, UA: >1000 mg/dL — AB
Hgb urine dipstick: NEGATIVE
Ketones, ur: NEGATIVE mg/dL
Nitrite: NEGATIVE
Protein, ur: NEGATIVE mg/dL
Specific Gravity, Urine: 1.02 (ref 1.005–1.030)
Urobilinogen, UA: 0.2 mg/dL (ref 0.0–1.0)
pH: 6.5 (ref 5.0–8.0)

## 2015-04-11 LAB — I-STAT CHEM 8, ED
BUN: 14 mg/dL (ref 6–20)
Calcium, Ion: 1.19 mmol/L (ref 1.13–1.30)
Chloride: 101 mmol/L (ref 101–111)
Creatinine, Ser: 0.3 mg/dL — ABNORMAL LOW (ref 0.44–1.00)
Glucose, Bld: 397 mg/dL — ABNORMAL HIGH (ref 65–99)
HCT: 52 % — ABNORMAL HIGH (ref 36.0–46.0)
Hemoglobin: 17.7 g/dL — ABNORMAL HIGH (ref 12.0–15.0)
Potassium: 3.7 mmol/L (ref 3.5–5.1)
Sodium: 138 mmol/L (ref 135–145)
TCO2: 18 mmol/L (ref 0–100)

## 2015-04-11 LAB — DIFFERENTIAL
Basophils Absolute: 0 10*3/uL (ref 0.0–0.1)
Basophils Relative: 0 % (ref 0–1)
Eosinophils Absolute: 0.1 10*3/uL (ref 0.0–0.7)
Eosinophils Relative: 1 % (ref 0–5)
Lymphocytes Relative: 27 % (ref 12–46)
Lymphs Abs: 1.1 10*3/uL (ref 0.7–4.0)
Monocytes Absolute: 0.3 10*3/uL (ref 0.1–1.0)
Monocytes Relative: 8 % (ref 3–12)
Neutro Abs: 2.6 10*3/uL (ref 1.7–7.7)
Neutrophils Relative %: 64 % (ref 43–77)

## 2015-04-11 LAB — GLUCOSE, CAPILLARY: Glucose-Capillary: 136 mg/dL — ABNORMAL HIGH (ref 65–99)

## 2015-04-11 LAB — URINE MICROSCOPIC-ADD ON

## 2015-04-11 LAB — CREATININE, SERUM
Creatinine, Ser: 0.43 mg/dL — ABNORMAL LOW (ref 0.44–1.00)
GFR calc Af Amer: >60 mL/min (ref >60)
GFR calc non Af Amer: >60 mL/min (ref >60)

## 2015-04-11 LAB — ETHANOL: Alcohol, Ethyl (B): <5 mg/dL (ref <5)

## 2015-04-11 LAB — PROTIME-INR
INR: 1.66 — ABNORMAL HIGH (ref 0.00–1.49)
Prothrombin Time: 19.7 seconds — ABNORMAL HIGH (ref 11.6–15.2)

## 2015-04-11 LAB — CBG MONITORING, ED
Glucose-Capillary: 442 mg/dL — ABNORMAL HIGH (ref 65–99)
Glucose-Capillary: 244 mg/dL — ABNORMAL HIGH (ref 65–99)

## 2015-04-11 LAB — CBC
HCT: 50.1 % — ABNORMAL HIGH (ref 36.0–46.0)
HCT: 48.2 % — ABNORMAL HIGH (ref 36.0–46.0)
Hemoglobin: 17.3 g/dL — ABNORMAL HIGH (ref 12.0–15.0)
Hemoglobin: 17.3 g/dL — ABNORMAL HIGH (ref 12.0–15.0)
MCH: 29.5 pg (ref 26.0–34.0)
MCH: 29.8 pg (ref 26.0–34.0)
MCHC: 34.5 g/dL (ref 30.0–36.0)
MCHC: 35.9 g/dL (ref 30.0–36.0)
MCV: 85.5 fL (ref 78.0–100.0)
MCV: 83 fL (ref 78.0–100.0)
Platelets: 186 10*3/uL (ref 150–400)
Platelets: 167 10*3/uL (ref 150–400)
RBC: 5.86 MIL/uL — ABNORMAL HIGH (ref 3.87–5.11)
RBC: 5.81 MIL/uL — ABNORMAL HIGH (ref 3.87–5.11)
RDW: 12.7 % (ref 11.5–15.5)
RDW: 12.8 % (ref 11.5–15.5)
WBC: 4 10*3/uL (ref 4.0–10.5)
WBC: 4.3 10*3/uL (ref 4.0–10.5)

## 2015-04-11 LAB — I-STAT TROPONIN, ED: Troponin i, poc: 0.01 ng/mL (ref 0.00–0.08)

## 2015-04-11 LAB — APTT: aPTT: 58 seconds — ABNORMAL HIGH (ref 24–37)

## 2015-04-11 MED ORDER — NITROGLYCERIN 0.4 MG SL SUBL: 0.4000 mg | SUBLINGUAL_TABLET | SUBLINGUAL | Status: DC | PRN

## 2015-04-11 MED ORDER — ASPIRIN 325 MG PO TABS
325.0000 mg | ORAL_TABLET | Freq: Every day | ORAL | Status: DC
  Administered 2015-04-11 – 2015-04-16 (×6): 325 mg via ORAL
  Filled 2015-04-11 (×6): qty 1

## 2015-04-11 MED ORDER — ASPIRIN 300 MG RE SUPP
300.0000 mg | Freq: Every day | RECTAL | Status: DC
  Filled 2015-04-11: qty 1

## 2015-04-11 MED ORDER — HYDRALAZINE HCL 20 MG/ML IJ SOLN: 5.0000 mg | Freq: Four times a day (QID) | INTRAMUSCULAR | Status: DC | PRN

## 2015-04-11 MED ORDER — HEPARIN SODIUM (PORCINE) 5000 UNIT/ML IJ SOLN
5000.0000 [IU] | Freq: Three times a day (TID) | INTRAMUSCULAR | Status: DC
  Administered 2015-04-11 – 2015-04-16 (×15): 5000 [IU] via SUBCUTANEOUS
  Filled 2015-04-11 (×14): qty 1

## 2015-04-11 MED ORDER — SODIUM CHLORIDE 0.9 % IV BOLUS (SEPSIS)
500.0000 mL | Freq: Once | INTRAVENOUS | Status: AC
  Administered 2015-04-11: 500 mL via INTRAVENOUS

## 2015-04-11 MED ORDER — ISOSORBIDE MONONITRATE ER 30 MG PO TB24
60.0000 mg | ORAL_TABLET | Freq: Every day | ORAL | Status: DC
  Administered 2015-04-11: 30 mg via ORAL
  Filled 2015-04-11: qty 2

## 2015-04-11 MED ORDER — METOPROLOL TARTRATE 1 MG/ML IV SOLN
5.0000 mg | Freq: Once | INTRAVENOUS | Status: AC
  Administered 2015-04-11: 5 mg via INTRAVENOUS
  Filled 2015-04-11: qty 5

## 2015-04-11 MED ORDER — SODIUM CHLORIDE 0.9 % IV SOLN
INTRAVENOUS | Status: DC
  Administered 2015-04-11 – 2015-04-14 (×6): via INTRAVENOUS

## 2015-04-11 MED ORDER — LISINOPRIL 20 MG PO TABS: 40.0000 mg | ORAL_TABLET | Freq: Every day | ORAL | Status: DC

## 2015-04-11 MED ORDER — AMLODIPINE BESYLATE 5 MG PO TABS: 5.0000 mg | ORAL_TABLET | Freq: Two times a day (BID) | ORAL | Status: DC

## 2015-04-11 MED ORDER — STROKE: EARLY STAGES OF RECOVERY BOOK
Freq: Once | Status: AC
  Administered 2015-04-11: 21:00:00

## 2015-04-11 MED ORDER — INSULIN ASPART 100 UNIT/ML ~~LOC~~ SOLN
0.0000 [IU] | SUBCUTANEOUS | Status: DC
  Administered 2015-04-11: 1 [IU] via SUBCUTANEOUS
  Administered 2015-04-11 – 2015-04-13 (×6): 3 [IU] via SUBCUTANEOUS
  Administered 2015-04-13: 2 [IU] via SUBCUTANEOUS
  Administered 2015-04-13: 7 [IU] via SUBCUTANEOUS
  Administered 2015-04-13 (×2): 2 [IU] via SUBCUTANEOUS
  Administered 2015-04-14: 3 [IU] via SUBCUTANEOUS
  Administered 2015-04-14: 9 [IU] via SUBCUTANEOUS
  Administered 2015-04-14 (×2): 2 [IU] via SUBCUTANEOUS
  Administered 2015-04-15: 5 [IU] via SUBCUTANEOUS
  Administered 2015-04-15: 3 [IU] via SUBCUTANEOUS
  Administered 2015-04-15: 2 [IU] via SUBCUTANEOUS
  Administered 2015-04-16: 3 [IU] via SUBCUTANEOUS
  Administered 2015-04-16 (×2): 1 [IU] via SUBCUTANEOUS
  Administered 2015-04-16 (×2): 3 [IU] via SUBCUTANEOUS
  Filled 2015-04-11: qty 1

## 2015-04-11 MED ORDER — SENNOSIDES-DOCUSATE SODIUM 8.6-50 MG PO TABS
1.0000 | ORAL_TABLET | Freq: Every evening | ORAL | Status: DC | PRN
Start: 2015-04-11 — End: 2015-04-16

## 2015-04-11 MED ORDER — INSULIN GLARGINE 100 UNIT/ML ~~LOC~~ SOLN
12.0000 [IU] | Freq: Every day | SUBCUTANEOUS | Status: DC
  Administered 2015-04-11 – 2015-04-15 (×5): 12 [IU] via SUBCUTANEOUS
  Filled 2015-04-11 (×6): qty 0.12

## 2015-04-11 MED ORDER — LISINOPRIL 10 MG PO TABS
10.0000 mg | ORAL_TABLET | Freq: Once | ORAL | Status: AC
  Administered 2015-04-11: 10 mg via ORAL
  Filled 2015-04-11: qty 1

## 2015-04-11 MED ORDER — INSULIN ASPART 100 UNIT/ML ~~LOC~~ SOLN
10.0000 [IU] | Freq: Once | SUBCUTANEOUS | Status: AC
  Administered 2015-04-11: 10 [IU] via SUBCUTANEOUS
  Filled 2015-04-11: qty 1

## 2015-04-11 MED ORDER — AMLODIPINE BESYLATE 5 MG PO TABS
5.0000 mg | ORAL_TABLET | Freq: Once | ORAL | Status: AC
  Administered 2015-04-11: 5 mg via ORAL
  Filled 2015-04-11: qty 1

## 2015-04-11 NOTE — ED Provider Notes (Signed)
CSN: [id number]     Arrival date & time 04/11/15  1502 History   First MD Initiated Contact with Patient 04/11/15 1523     Chief Complaint  Patient presents with  . Visual Field Change  . Hyperglycemia     (Consider location/radiation/quality/duration/timing/severity/associated sxs/prior Treatment) HPI Comments: The patient is a 64 year old female, she has a history of Stroke, CAD s/p STEMI 3 years ago, diabetes and hypertension as well as a history of a stroke approximately 2 years ago. She presents to the hospital with visual problems which started yesterday in the middle of the afternoon. She reports double vision and a difficulty ambulate in stating that she feels off balance and ataxic. This is persistent, nothing makes this better or worse, it was not associated with weakness or numbness of the 4 extremities, no difficulty speaking, no facial droop. She does report that she has not been taking blood pressure orthostatic medications in quite some time, she states she was frustrated with having to take so many medications that were seemingly not effective. She has not had insulin in over 7 months, she is unclear on the time she has been noncompliant with a blood pressure medications. At this time she denies headache, chest pain or palpitations, no shortness of breath weakness numbness of the extremities or swelling. She denies dysuria diarrhea hematuria rectal bleeding or rashes.  Patient is a 64 y.o. female presenting with hyperglycemia. The history is provided by the patient and the EMS personnel.  Hyperglycemia   Past Medical History  Diagnosis Date  . HTN (hypertension)   . Diabetes mellitus without complication   . Myocardial infarction 09/07/12  . Breast cancer 12/03/12    a. 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 S/p chemo with CMF and radiation.  Marland Kitchen History of radiation therapy 06/23/2013-08/08/2013    62.4 gray to left breast  . Hypercholesteremia    . Coronary artery disease     a. STEMI 08/2012: s/p DES to LAD, PTCA to downstream LAD. b. Relook cath same hospitalization: stable post-PCI anatomy with Stable Diag lesions (unlikely cause of resting angina, not good PCI targets), stable RCA lesion (non flow limiting).  . Stroke, embolic     a. 56/2130 post cath.  . Peripheral vision loss     left  . Balance problems     due to stroke  . History of cancer chemotherapy     last in August 2014  . GERD (gastroesophageal reflux disease)     food related  . Arthritis     back  . Renal artery stenosis     a. 20% L RAS in 08/2012 by cath. b. Duplex 07/2012: >60% L RAS.  . LV dysfunction     a. EF 45-50% at time of STEMI 08/2012. b. Echo 04/2013: EF 45-50%, mild focal basal hypertrophy of septum, grade 1 d/d.  Marland Kitchen Vasovagal syncope     a. 04/2013 - echo stable compared to prior EF 45-50%, negative carotid duplex.   Past Surgical History  Procedure Laterality Date  . Coronary angioplasty with stent placement    . Breast lumpectomy with needle localization and axillary sentinel lymph node bx Left 01/08/2013    Procedure: LEFT BREAST WIRE GUIDED  LUMPECTOMY AND LEFT AXILLARY SENTINEL  NODE BX;  Surgeon: Rolm Bookbinder, MD;  Location: [facility];  Service: General;  Laterality: Left;  . Portacath placement Right 02/17/2013    Procedure: INSERTION PORT-A-CATH;  Surgeon: Rolm Bookbinder, MD;  Location:  [facility] OR;  Service: General;  Laterality: Right;  . Back surgery      x3, lower back  . Cataract extraction Bilateral   . Port-a-cath removal N/A 10/14/2013    Procedure: REMOVAL PORT-A-CATH;  Surgeon: Rolm Bookbinder, MD;  Location: [facility];  Service: General;  Laterality: N/A;  . Left heart catheterization with coronary angiogram N/A 09/07/2012    Procedure: LEFT HEART CATHETERIZATION WITH CORONARY ANGIOGRAM;  Surgeon: Troy Sine, MD;  Location: [facility] CATH LAB;  Service: Cardiovascular;  Laterality: N/A;  . Percutaneous coronary stent intervention  (pci-s) N/A 09/07/2012    Procedure: PERCUTANEOUS CORONARY STENT INTERVENTION (PCI-S);  Surgeon: Troy Sine, MD;  Location: [facility] CATH LAB;  Service: Cardiovascular;  Laterality: N/A;  . Left heart catheterization with coronary angiogram  09/09/2012    Procedure: LEFT HEART CATHETERIZATION WITH CORONARY ANGIOGRAM;  Surgeon: Leonie Man, MD;  Location: [facility] CATH LAB;  Service: Cardiovascular;;   Family History  Problem Relation Age of Onset  . Cancer Neg Hx   . Diabetes Neg Hx   . Hyperlipidemia Neg Hx   . Hypertension Neg Hx   . Kidney disease Neg Hx   . Stroke Neg Hx    History  Substance Use Topics  . Smoking status: Never Smoker   . Smokeless tobacco: Never Used  . Alcohol Use: No   OB History    No data available     Review of Systems  All other systems reviewed and are negative.     Allergies  Review of patient's allergies indicates no known allergies.  Home Medications   Prior to Admission medications   Medication Sig Start Date End Date Taking? Authorizing Provider  aspirin EC 81 MG EC tablet Take 1 tablet (81 mg total) by mouth daily. 09/13/12  Yes Brett Canales, PA-C  glucose blood [facility] VERIO) test strip Use TID 10/03/12  Yes Janith Lima, MD  ibuprofen (ADVIL,MOTRIN) 200 MG tablet Take 200 mg by mouth every 6 (six) hours as needed for mild pain or moderate pain. For pain   Yes Historical Provider, MD  nitroGLYCERIN (NITROSTAT) 0.4 MG SL tablet Place 0.4 mg under the tongue every 5 (five) minutes as needed for chest pain. 09/13/12  Yes Brett Canales, PA-C  amLODipine (NORVASC) 5 MG tablet Take 1 tablet (5 mg total) by mouth 2 (two) times daily. Patient not taking: Reported on 04/11/2015 08/14/14   Dayna N Dunn, PA-C  atorvastatin (LIPITOR) 80 MG tablet Take 1 tablet (80 mg total) by mouth daily with supper. Patient not taking: Reported on 04/11/2015 08/14/14   Dayna N Dunn, PA-C  hydrochlorothiazide (HYDRODIURIL) 25 MG tablet Take 1 tablet (25 mg total) by  mouth daily. Patient not taking: Reported on 04/11/2015 08/24/14   Dayna N Dunn, PA-C  insulin aspart (NOVOLOG) 100 UNIT/ML FlexPen Inject 10 Units into the skin 3 (three) times daily with meals. Patient not taking: Reported on 04/11/2015 08/04/14   Donella Stade, PA-C  Insulin Glargine (LANTUS SOLOSTAR) 100 UNIT/ML Solostar Pen Inject 31 units [place] at bedtime. Patient not taking: Reported on 04/11/2015 08/04/14   Donella Stade, PA-C  isosorbide mononitrate (IMDUR) 60 MG 24 hr tablet Take 1 tablet (60 mg total) by mouth daily. Patient not taking: Reported on 04/11/2015 08/14/14   Dayna N Dunn, PA-C  lisinopril (PRINIVIL,ZESTRIL) 40 MG tablet Take 1 tablet (40 mg total) by mouth daily with breakfast. Patient not taking: Reported on 04/11/2015 08/14/14   Charlie Pitter,  PA-C  metFORMIN (GLUCOPHAGE XR) 750 MG 24 hr tablet Take 1 tablet (750 mg total) by mouth daily with breakfast. Patient not taking: Reported on 04/11/2015 08/04/14   Royetta Car Breeback, PA-C  metoprolol (LOPRESSOR) 100 MG tablet Take 2 tablets (200 mg total) by mouth 2 (two) times daily. Patient not taking: Reported on 04/11/2015 08/14/14   Dayna N Dunn, PA-C   BP 187/104 mmHg  Pulse 73  Temp(Src) 98.6 F (37 C) (Oral)  Resp 17  SpO2 97% Physical Exam  Constitutional: She appears well-developed and well-nourished. No distress.  HENT:  Head: Normocephalic and atraumatic.  Mouth/Throat: Oropharynx is clear and moist. No oropharyngeal exudate.  Eyes: Conjunctivae and EOM are normal. Pupils are equal, round, and reactive to light. Right eye exhibits no discharge. Left eye exhibits no discharge. No scleral icterus.  Neck: Normal range of motion. Neck supple. No JVD present. No thyromegaly present.  Cardiovascular: Normal rate, regular rhythm, normal heart sounds and intact distal pulses.  Exam reveals no gallop and no friction rub.   No murmur heard. Pulmonary/Chest: Effort normal and breath sounds normal. No respiratory distress. She has  no wheezes. She has no rales.  Abdominal: Soft. Bowel sounds are normal. She exhibits no distension and no mass. There is no tenderness.  Musculoskeletal: Normal range of motion. She exhibits no edema or tenderness.  Lymphadenopathy:    She has no cervical adenopathy.  Neurological: She is alert. Coordination normal.  Mild head tremor, she states this is a chronic condition and is followed by neurology. Strength is normal in all 4 extremities, coordination by finger-nose-finger is normal, she is able to stand without falling to the side, she has normal rapid alternating movements, Romberg negative  Skin: Skin is warm and dry. No rash noted. No erythema.  Psychiatric: She has a normal mood and affect. Her behavior is normal.  Nursing note and vitals reviewed.   ED Course  Procedures (including critical care time) Labs Review Labs Reviewed  CBC - Abnormal; Notable for the following:    RBC 5.86 (*)    Hemoglobin 17.3 (*)    HCT 50.1 (*)    All other components within normal limits  CBG MONITORING, ED - Abnormal; Notable for the following:    Glucose-Capillary 442 (*)    All other components within normal limits  I-STAT CHEM 8, ED - Abnormal; Notable for the following:    Creatinine, Ser 0.30 (*)    Glucose, Bld 397 (*)    Hemoglobin 17.7 (*)    HCT 52.0 (*)    All other components within normal limits  DIFFERENTIAL  COMPREHENSIVE METABOLIC PANEL  URINALYSIS, ROUTINE W REFLEX MICROSCOPIC (NOT AT [facility])  ETHANOL  PROTIME-INR  APTT  DIFFERENTIAL  URINE RAPID DRUG SCREEN, HOSP PERFORMED  I-STAT TROPOININ, ED    Imaging Review Ct Head Wo Contrast  04/11/2015   CLINICAL DATA:  Vision changes, hyperglycemia  EXAM: CT HEAD WITHOUT CONTRAST  TECHNIQUE: Contiguous axial images were obtained from the base of the skull through the vertex without intravenous contrast.  COMPARISON:  MR brain 06/27/2013  FINDINGS: There is no evidence of mass effect, midline shift, or extra-axial fluid  collections. There is no evidence of a space-occupying lesion or intracranial hemorrhage. There is no evidence of a cortical-based area of acute infarction. There is an old right posterior parietal lobe infarct with encephalomalacia. There is an old small right cerebellar infarct. There is periventricular white matter low attenuation likely secondary to microangiopathy.  The  ventricles and sulci are appropriate for the patient's age. The basal cisterns are patent.  Visualized portions of the orbits are unremarkable. The visualized portions of the paranasal sinuses and mastoid air cells are unremarkable. Cerebrovascular atherosclerotic calcifications are noted.  The osseous structures are unremarkable.  IMPRESSION: 1. No acute intracranial pathology. 2. Chronic microvascular disease .   Electronically Signed   By: Kathreen Devoid   On: 04/11/2015 16:30     EKG Interpretation   Date/Time:  Sunday April 11 2015 15:24:57 EDT Ventricular Rate:  92 PR Interval:  189 QRS Duration: 107 QT Interval:  384 QTC Calculation: 475 R Axis:   -70 Text Interpretation:  Sinus rhythm Probable left atrial enlargement Left  anterior fascicular block Anterior infarct, old No significant change  since last tracing Confirmed by Winfred Leeds  MD, SAM [id number]) on 04/11/2015  3:29:19 PM      MDM   Final diagnoses:  Ataxia  Diplopia  Hyperglycemia  Essential hypertension    Severe hypertension at 220/120 on arrival, she will need blood pressure medications and initiation of stroke workup though this could also be related to her hyperglycemia and likely dehydrated state. IV fluids, antihypertensives, CT scan of the brain, anticipate admission for further workup and stroke rule out. She is at high risk for both  Care was discussed with the hospitalist, he will admit the patient to the hospital, care was discussed with Dr. Doy Mince of neurology, she will request the patient is admitted at [facility], the patient will be  transferred.  Antihypertensives medications were given including Norvasc, lisinopril, metoprolol. Blood pressure has improved modestly to 187/104. Her symptoms remain, her CT scan does not show acute infarct, I am concerned that she does have an ischemic disease of the brain, she is not in DKA.  Meds given in ED:  Medications  amLODipine (NORVASC) tablet 5 mg (5 mg Oral Given 04/11/15 1707)  lisinopril (PRINIVIL,ZESTRIL) tablet 10 mg (10 mg Oral Given 04/11/15 1707)  metoprolol (LOPRESSOR) injection 5 mg (5 mg Intravenous Given 04/11/15 1643)  insulin aspart (novoLOG) injection 10 Units (10 Units Subcutaneous Given 04/11/15 1643)      Noemi Chapel, MD 04/11/15 1714

## 2015-04-11 NOTE — Consult Note (Addendum)
Referring Physician: [facility]    Chief Complaint: diplopia with worsening dysequilibrium   HPI:                                                                                                                                         Marisa Forbes is an 64 y.o. 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 She initially presented to [facility] with complains of double vision and worsening balance that started yesterday around 3 pm. Patient indicated that she gets off balance easily but she never had double vision before and her balance has ben worse since yesterday. She realized that her vision was double while watching TV but will go away after closing one eye. Denies associated HA, vertigo, difficulty swallowing, focal weakness or numbness,slurred speech, or language impairment. Upon arrival to [facility] was noticed to have uncontrolled blood pressure 227/119, and also with uncontrolled blood sugar (patient admits to no taking her medications for several months). CT brain performed today was personally reviewed and showed no acute abnormality.  Date last known well: 04/10/15 Time last known well: 3 pm tPA Given: no, out of the window   Past Medical History  Diagnosis Date  . HTN (hypertension)   . Diabetes mellitus without complication   . Myocardial infarction 09/07/12  . Breast cancer 12/03/12    a. 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 S/p chemo with CMF and radiation.  Marland Kitchen History of radiation therapy 06/23/2013-08/08/2013    62.4 gray to left breast  . Hypercholesteremia   . Coronary artery disease     a. STEMI  08/2012: s/p DES to LAD, PTCA to downstream LAD. b. Relook cath same hospitalization: stable post-PCI anatomy with Stable Diag lesions (unlikely cause of resting angina, not good PCI targets), stable RCA lesion (non flow limiting).  . Stroke, embolic     a. 71/6967 post cath.  . Peripheral vision loss     left  . Balance problems     due to stroke  . History of cancer chemotherapy     last in August 2014  . GERD (gastroesophageal reflux disease)     food related  . Arthritis     back  . Renal artery stenosis     a. 20% L RAS in 08/2012 by cath. b. Duplex 07/2012: >60% L RAS.  . LV dysfunction     a. EF 45-50% at time of STEMI 08/2012. b. Echo 04/2013: EF 45-50%, mild focal basal hypertrophy of septum, grade 1 d/d.  Marland Kitchen Vasovagal syncope     a. 04/2013 -  echo stable compared to prior EF 45-50%, negative carotid duplex.    Past Surgical History  Procedure Laterality Date  . Coronary angioplasty with stent placement    . Breast lumpectomy with needle localization and axillary sentinel lymph node bx Left 01/08/2013    Procedure: LEFT BREAST WIRE GUIDED  LUMPECTOMY AND LEFT AXILLARY SENTINEL  NODE BX;  Surgeon: Rolm Bookbinder, MD;  Location: [place];  Service: General;  Laterality: Left;  . Portacath placement Right 02/17/2013    Procedure: INSERTION PORT-A-CATH;  Surgeon: Rolm Bookbinder, MD;  Location: [place];  Service: General;  Laterality: Right;  . Back surgery      x3, lower back  . Cataract extraction Bilateral   . Port-a-cath removal N/A 10/14/2013    Procedure: REMOVAL PORT-A-CATH;  Surgeon: Rolm Bookbinder, MD;  Location: [facility];  Service: General;  Laterality: N/A;  . Left heart catheterization with coronary angiogram N/A 09/07/2012    Procedure: LEFT HEART CATHETERIZATION WITH CORONARY ANGIOGRAM;  Surgeon: Troy Sine, MD;  Location: [facility] CATH LAB;  Service: Cardiovascular;  Laterality: N/A;  . Percutaneous coronary stent intervention (pci-s) N/A 09/07/2012    Procedure:  PERCUTANEOUS CORONARY STENT INTERVENTION (PCI-S);  Surgeon: Troy Sine, MD;  Location: [facility] CATH LAB;  Service: Cardiovascular;  Laterality: N/A;  . Left heart catheterization with coronary angiogram  09/09/2012    Procedure: LEFT HEART CATHETERIZATION WITH CORONARY ANGIOGRAM;  Surgeon: Leonie Man, MD;  Location: [facility] CATH LAB;  Service: Cardiovascular;;    Family History  Problem Relation Age of Onset  . Cancer Neg Hx   . Diabetes Neg Hx   . Hyperlipidemia Neg Hx   . Hypertension Neg Hx   . Kidney disease Neg Hx   . Stroke Neg Hx    Social History:  reports that she has never smoked. She has never used smokeless tobacco. She reports that she does not drink alcohol or use illicit drugs.  Allergies: No Known Allergies  Medications:                                                                                                                           Scheduled: . [START ON 04/12/2015] amLODipine  5 mg Oral BID  . aspirin  300 mg Rectal Daily   Or  . aspirin  325 mg Oral Daily  . heparin  5,000 Units Subcutaneous 3 times per day  . insulin aspart  0-9 Units Subcutaneous 6 times per day  . insulin glargine  12 Units Subcutaneous QHS  . isosorbide mononitrate  60 mg Oral Daily  . [START ON 04/12/2015] lisinopril  40 mg Oral Q breakfast    ROS:  History obtained from chart review and the patient  General ROS: negative for - chills, fatigue, fever, night sweats, weight gain or weight loss Psychological ROS: negative for - behavioral disorder, hallucinations, memory difficulties, mood swings or suicidal ideation Ophthalmic ROS: negative for - blurry vision, double vision, eye pain or loss of vision ENT ROS: negative for - epistaxis, nasal discharge, oral lesions, sore throat, tinnitus or vertigo Allergy and Immunology ROS: negative for - hives or  itchy/watery eyes Hematological and Lymphatic ROS: negative for - bleeding problems, bruising or swollen lymph nodes Endocrine ROS: negative for - galactorrhea, hair pattern changes, polydipsia/polyuria or temperature intolerance Respiratory ROS: negative for - cough, hemoptysis, shortness of breath or wheezing Cardiovascular ROS: negative for - chest pain, dyspnea on exertion, edema or irregular heartbeat Gastrointestinal ROS: negative for - abdominal pain, diarrhea, hematemesis, nausea/vomiting or stool incontinence Genito-Urinary ROS: negative for - dysuria, hematuria, incontinence or urinary frequency/urgency Musculoskeletal ROS: negative for - joint swelling or muscular weakness Neurological ROS: as noted in HPI Dermatological ROS: negative for rash and skin lesion changes   Physical exam: pleasant female in no apparent distress.Blood pressure 190/104, pulse 79, temperature 98.1 F (36.7 C), temperature source Oral, resp. rate 18, SpO2 97 %. Head: normocephalic. Neck: supple, no bruits, no JVD. Cardiac: no murmurs. Lungs: clear. Abdomen: soft, no tender, no mass. Extremities: no edema. Skin: no rash  Neurologic Examination:                                                                                                      General: Mental Status: Alert, oriented, thought content appropriate.  Speech fluent without evidence of aphasia.  Able to follow 3 step commands without difficulty. Cranial Nerves: II: Discs flat bilaterally; left hemianopia, pupils equal, round, reactive to light and accommodation III,IV, VI: ptosis not present, extra-ocular motions intact bilaterally V,VII: smile symmetric, facial light touch sensation normal bilaterally VIII: hearing normal bilaterally IX,X: uvula rises symmetrically XI: bilateral shoulder shrug XII: midline tongue extension without atrophy or fasciculations  Motor: Right : Upper extremity   5/5    Left:     Upper extremity   5/5  Lower  extremity   5/5     Lower extremity   5/5 Tone and bulk:normal tone throughout; no atrophy noted Sensory: Pinprick and light touch intact throughout, bilaterally Deep Tendon Reflexes:  Right: Upper Extremity   Left: Upper extremity   biceps (C-5 to C-6) 2/4   biceps (C-5 to C-6) 2/4 tricep (C7) 2/4    triceps (C7) 2/4 Brachioradialis (C6) 2/4  Brachioradialis (C6) 2/4  Lower Extremity Lower Extremity  quadriceps (L-2 to L-4) 2/4   quadriceps (L-2 to L-4) 2/4 Achilles (S1) 2/4   Achilles (S1) 2/4  Plantars: Right: downgoing   Left: downgoing Cerebellar: normal finger-to-nose,  normal heel-to-shin test. Mild head tremor Gait:  No tested due to multiple leads.    Results for orders placed or performed during the hospital encounter of 04/11/15 (from the past 48 hour(s))  CBG monitoring, ED     Status: Abnormal   Collection Time:  04/11/15  3:13 PM  Result Value Ref Range   Glucose-Capillary 442 (H) 65 - 99 mg/dL  CBC     Status: Abnormal   Collection Time: 04/11/15  4:10 PM  Result Value Ref Range   WBC 4.0 4.0 - 10.5 K/uL   RBC 5.86 (H) 3.87 - 5.11 MIL/uL   Hemoglobin 17.3 (H) 12.0 - 15.0 g/dL   HCT 50.1 (H) 36.0 - 46.0 %   MCV 85.5 78.0 - 100.0 fL   MCH 29.5 26.0 - 34.0 pg   MCHC 34.5 30.0 - 36.0 g/dL   RDW 12.7 11.5 - 15.5 %   Platelets 186 150 - 400 K/uL  Comprehensive metabolic panel     Status: Abnormal   Collection Time: 04/11/15  4:10 PM  Result Value Ref Range   Sodium 135 135 - 145 mmol/L   Potassium 4.0 3.5 - 5.1 mmol/L   Chloride 103 101 - 111 mmol/L   CO2 21 (L) 22 - 32 mmol/L   Glucose, Bld 390 (H) 65 - 99 mg/dL   BUN 14 6 - 20 mg/dL   Creatinine, Ser 0.50 0.44 - 1.00 mg/dL   Calcium 9.4 8.9 - 10.3 mg/dL   Total Protein 7.0 6.5 - 8.1 g/dL   Albumin 4.3 3.5 - 5.0 g/dL   AST 15 15 - 41 U/L   ALT 15 14 - 54 U/L   Alkaline Phosphatase 98 38 - 126 U/L   Total Bilirubin 0.7 0.3 - 1.2 mg/dL   GFR calc non Af Amer >60 >60 mL/min   GFR calc Af Amer >60 >60  mL/min    Comment: (NOTE) The eGFR has been calculated using the CKD EPI equation. This calculation has not been validated in all clinical situations. eGFR's persistently <60 mL/min signify possible Chronic Kidney Disease.    Anion gap 11 5 - 15  Ethanol     Status: None   Collection Time: 04/11/15  4:10 PM  Result Value Ref Range   Alcohol, Ethyl (B) <5 <5 mg/dL    Comment:        LOWEST DETECTABLE LIMIT FOR SERUM ALCOHOL IS 5 mg/dL FOR MEDICAL PURPOSES ONLY   Differential     Status: None   Collection Time: 04/11/15  4:10 PM  Result Value Ref Range   Neutrophils Relative % 64 43 - 77 %   Neutro Abs 2.6 1.7 - 7.7 K/uL   Lymphocytes Relative 27 12 - 46 %   Lymphs Abs 1.1 0.7 - 4.0 K/uL   Monocytes Relative 8 3 - 12 %   Monocytes Absolute 0.3 0.1 - 1.0 K/uL   Eosinophils Relative 1 0 - 5 %   Eosinophils Absolute 0.1 0.0 - 0.7 K/uL   Basophils Relative 0 0 - 1 %   Basophils Absolute 0.0 0.0 - 0.1 K/uL  I-stat troponin, ED (not at [facility], [facility])     Status: None   Collection Time: 04/11/15  4:15 PM  Result Value Ref Range   Troponin i, poc 0.01 0.00 - 0.08 ng/mL   Comment 3            Comment: Due to the release kinetics of cTnI, a negative result within the first hours of the onset of symptoms does not rule out myocardial infarction with certainty. If myocardial infarction is still suspected, repeat the test at appropriate intervals.   I-Stat Chem 8, ED  (not at [facility], [facility])     Status: Abnormal   Collection Time: 04/11/15  4:17 PM  Result Value Ref Range   Sodium 138 135 - 145 mmol/L   Potassium 3.7 3.5 - 5.1 mmol/L   Chloride 101 101 - 111 mmol/L   BUN 14 6 - 20 mg/dL   Creatinine, Ser 0.30 (L) 0.44 - 1.00 mg/dL   Glucose, Bld 397 (H) 65 - 99 mg/dL   Calcium, Ion 1.19 1.13 - 1.30 mmol/L   TCO2 18 0 - 100 mmol/L   Hemoglobin 17.7 (H) 12.0 - 15.0 g/dL   HCT 52.0 (H) 36.0 - 46.0 %  Protime-INR     Status: Abnormal   Collection Time: 04/11/15  4:20 PM  Result Value Ref  Range   Prothrombin Time 19.7 (H) 11.6 - 15.2 seconds   INR 1.66 (H) 0.00 - 1.49  APTT     Status: Abnormal   Collection Time: 04/11/15  4:20 PM  Result Value Ref Range   aPTT 58 (H) 24 - 37 seconds    Comment:        IF BASELINE aPTT IS ELEVATED, SUGGEST PATIENT RISK ASSESSMENT BE USED TO DETERMINE APPROPRIATE ANTICOAGULANT THERAPY.   CBG monitoring, ED     Status: Abnormal   Collection Time: 04/11/15  5:59 PM  Result Value Ref Range   Glucose-Capillary 244 (H) 65 - 99 mg/dL  Urinalysis, Routine w reflex microscopic     Status: Abnormal   Collection Time: 04/11/15  6:07 PM  Result Value Ref Range   Color, Urine YELLOW YELLOW   APPearance CLEAR CLEAR   Specific Gravity, Urine 1.020 1.005 - 1.030   pH 6.5 5.0 - 8.0   Glucose, UA >1000 (A) NEGATIVE mg/dL   Hgb urine dipstick NEGATIVE NEGATIVE   Bilirubin Urine NEGATIVE NEGATIVE   Ketones, ur NEGATIVE NEGATIVE mg/dL   Protein, ur NEGATIVE NEGATIVE mg/dL   Urobilinogen, UA 0.2 0.0 - 1.0 mg/dL   Nitrite NEGATIVE NEGATIVE   Leukocytes, UA SMALL (A) NEGATIVE  Urine rapid drug screen (hosp performed)not at [facility]     Status: None   Collection Time: 04/11/15  6:07 PM  Result Value Ref Range   Opiates NONE DETECTED NONE DETECTED   Cocaine NONE DETECTED NONE DETECTED   Benzodiazepines NONE DETECTED NONE DETECTED   Amphetamines NONE DETECTED NONE DETECTED   Tetrahydrocannabinol NONE DETECTED NONE DETECTED   Barbiturates NONE DETECTED NONE DETECTED    Comment:        DRUG SCREEN FOR MEDICAL PURPOSES ONLY.  IF CONFIRMATION IS NEEDED FOR ANY PURPOSE, NOTIFY LAB WITHIN 5 DAYS.        LOWEST DETECTABLE LIMITS FOR URINE DRUG SCREEN Drug Class       Cutoff (ng/mL) Amphetamine      1000 Barbiturate      200 Benzodiazepine   094 Tricyclics       709 Opiates          300 Cocaine          300 THC              50   Urine microscopic-add on     Status: Abnormal   Collection Time: 04/11/15  6:07 PM  Result Value Ref Range   Squamous  Epithelial / LPF FEW (A) RARE   WBC, UA TOO NUMEROUS TO COUNT <3 WBC/hpf   Bacteria, UA MANY (A) RARE  CBC     Status: Abnormal   Collection Time: 04/11/15  7:30 PM  Result Value Ref Range   WBC 4.3 4.0 - 10.5 K/uL  RBC 5.81 (H) 3.87 - 5.11 MIL/uL   Hemoglobin 17.3 (H) 12.0 - 15.0 g/dL   HCT 48.2 (H) 36.0 - 46.0 %   MCV 83.0 78.0 - 100.0 fL   MCH 29.8 26.0 - 34.0 pg   MCHC 35.9 30.0 - 36.0 g/dL   RDW 12.8 11.5 - 15.5 %   Platelets 167 150 - 400 K/uL  Creatinine, serum     Status: Abnormal   Collection Time: 04/11/15  7:30 PM  Result Value Ref Range   Creatinine, Ser 0.43 (L) 0.44 - 1.00 mg/dL   GFR calc non Af Amer >60 >60 mL/min   GFR calc Af Amer >60 >60 mL/min    Comment: (NOTE) The eGFR has been calculated using the CKD EPI equation. This calculation has not been validated in all clinical situations. eGFR's persistently <60 mL/min signify possible Chronic Kidney Disease.   Glucose, capillary     Status: Abnormal   Collection Time: 04/11/15  8:35 PM  Result Value Ref Range   Glucose-Capillary 136 (H) 65 - 99 mg/dL   Comment 1 Notify RN    Comment 2 Document in Chart    Ct Head Wo Contrast  04/11/2015   CLINICAL DATA:  Vision changes, hyperglycemia  EXAM: CT HEAD WITHOUT CONTRAST  TECHNIQUE: Contiguous axial images were obtained from the base of the skull through the vertex without intravenous contrast.  COMPARISON:  MR brain 06/27/2013  FINDINGS: There is no evidence of mass effect, midline shift, or extra-axial fluid collections. There is no evidence of a space-occupying lesion or intracranial hemorrhage. There is no evidence of a cortical-based area of acute infarction. There is an old right posterior parietal lobe infarct with encephalomalacia. There is an old small right cerebellar infarct. There is periventricular white matter low attenuation likely secondary to microangiopathy.  The ventricles and sulci are appropriate for the patient's age. The basal cisterns are patent.   Visualized portions of the orbits are unremarkable. The visualized portions of the paranasal sinuses and mastoid air cells are unremarkable. Cerebrovascular atherosclerotic calcifications are noted.  The osseous structures are unremarkable.  IMPRESSION: 1. No acute intracranial pathology. 2. Chronic microvascular disease .   Electronically Signed   By: Kathreen Devoid   On: 04/11/2015 16:30    Assessment: 64 y.o. female with new onset diplopia and worsening unsteadiness. Neuro-exam is unimpressive except for left hemianopia residual from prior right PCA territory infarct and head tremor. CT brain without acute abnormality. Concern for possible new brainstem infarct but hypertensive urgency could also had played a role . Out of the window for thrombolysis. Recommend MRI/MRA brain. Resume aspirin. Further work up as delineated below if MRI positive for stroke. Stroke team will follow up tomorrow.  Stroke Risk Factors -  HTN, DM type 2, hypercholesterolemia, CAD, stroke  Plan: 1. HgbA1c, fasting lipid panel 2. Echocardiogram 3. Carotid dopplers 4. Prophylactic therapy-aspirin after passing swallowing evaluation 5. Risk factor modification 6. Telemetry monitoring 7. Frequent neuro checks 8. PT/OT SLP  Dorian Pod, MD [facility] [phone]  04/11/2015, 9:32 PM

## 2015-04-11 NOTE — ED Notes (Addendum)
Pt c/o vision change, states that each eye alone can see without difficulty, but that both eyes together see blurred vision. Pt states she has ataxia residual from CVA 2 years ago. CBG 442. BP 227/119.

## 2015-04-11 NOTE — H&P (Addendum)
Patient Demographics  Marisa Forbes, is a 64 y.o. female  MRN: [id number]   DOB - Sep 28, 1951  Admit Date - 04/11/2015  Outpatient Primary MD for the patient is Scarlette Calico, MD   With History of -  Past Medical History  Diagnosis Date  . HTN (hypertension)   . Diabetes mellitus without complication   . Myocardial infarction 09/07/12  . Breast cancer 12/03/12    a. 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 S/p chemo with CMF and radiation.  Marland Kitchen History of radiation therapy 06/23/2013-08/08/2013    62.4 gray to left breast  . Hypercholesteremia   . Coronary artery disease     a. STEMI 08/2012: s/p DES to LAD, PTCA to downstream LAD. b. Relook cath same hospitalization: stable post-PCI anatomy with Stable Diag lesions (unlikely cause of resting angina, not good PCI targets), stable RCA lesion (non flow limiting).  . Stroke, embolic     a. 04/3709 post cath.  . Peripheral vision loss     left  . Balance problems     due to stroke  . History of cancer chemotherapy     last in August 2014  . GERD (gastroesophageal reflux disease)     food related  . Arthritis     back  . Renal artery stenosis     a. 20% L RAS in 08/2012 by cath. b. Duplex 07/2012: >60% L RAS.  . LV dysfunction     a. EF 45-50% at time of STEMI 08/2012. b. Echo 04/2013: EF 45-50%, mild focal basal hypertrophy of septum, grade 1 d/d.  Marland Kitchen Vasovagal syncope     a. 04/2013 - echo stable compared to prior EF 45-50%, negative carotid duplex.      Past Surgical History  Procedure Laterality Date  . Coronary angioplasty with stent placement    . Breast lumpectomy with needle localization and axillary sentinel lymph node bx Left 01/08/2013    Procedure: LEFT BREAST WIRE GUIDED  LUMPECTOMY AND LEFT AXILLARY SENTINEL  NODE BX;  Surgeon: Rolm Bookbinder, MD;  Location: [place];  Service: General;  Laterality: Left;  . Portacath placement Right 02/17/2013    Procedure: INSERTION PORT-A-CATH;   Surgeon: Rolm Bookbinder, MD;  Location: [place];  Service: General;  Laterality: Right;  . Back surgery      x3, lower back  . Cataract extraction Bilateral   . Port-a-cath removal N/A 10/14/2013    Procedure: REMOVAL PORT-A-CATH;  Surgeon: Rolm Bookbinder, MD;  Location: [facility];  Service: General;  Laterality: N/A;  . Left heart catheterization with coronary angiogram N/A 09/07/2012    Procedure: LEFT HEART CATHETERIZATION WITH CORONARY ANGIOGRAM;  Surgeon: Troy Sine, MD;  Location: [facility] CATH LAB;  Service: Cardiovascular;  Laterality: N/A;  . Percutaneous coronary stent intervention (pci-s) N/A 09/07/2012    Procedure: PERCUTANEOUS CORONARY STENT INTERVENTION (PCI-S);  Surgeon: Troy Sine, MD;  Location: [facility] CATH LAB;  Service: Cardiovascular;  Laterality: N/A;  . Left heart catheterization with coronary angiogram  09/09/2012    Procedure: LEFT HEART CATHETERIZATION WITH CORONARY ANGIOGRAM;  Surgeon: Leonie Man, MD;  Location: [facility] CATH LAB;  Service: Cardiovascular;;    in for   Chief Complaint  Patient presents with  . Visual Field Change  . Hyperglycemia     HPI  Marisa Forbes  is a 64 y.o. female, with past medical history of breast cancer, hypertension, diabetes mellitus, coronary artery disease, embolic CVA in the past,  since with complaint of double vision, and unsteady gait, patient reportedly from a previous CVA she had residual left visual field deficit, and poor balance, but reports yesterday afternoon she noticed herself to have double vision, and having more wobbly gait and usual, she presents to ED today, the head showing no evidence of acute intracranial finding, old infarct and chronic microvascular disease, a shunt was noticed to have uncontrolled blood pressure 227/119, she reports she stopped taking all antihypertensives medication before 6 months without consulting her doctor, as she thought medications were not helping, blood glucose uncontrolled as well with  level of 397, for which she stopped taking her metformin and Lantus 6 months ago as well, a patient reports significant improvement of her double vision, and unsteady gait.    Review of Systems    In addition to the HPI above,  No Fever-chills, No Headache, reports double vision, with baseline left visual field deficit. No problems swallowing food or Liquids, No Chest pain, Cough or Shortness of Breath, No Abdominal pain, No Nausea or Vommitting, Bowel movements are regular, No Blood in stool or Urine, No dysuria, No new skin rashes or bruises, No new joints pains-aches,  No new weakness, tingling, numbness in any extremity, No recent weight gain or loss, No polyuria, polydypsia or polyphagia, No significant Mental Stressors.  A full 10 point Review of Systems was done, except as stated above, all other Review of Systems were negative.   Social History History  Substance Use Topics  . Smoking status: Never Smoker   . Smokeless tobacco: Never Used  . Alcohol Use: No   Family History Family History  Problem Relation Age of Onset  . Cancer Neg Hx   . Diabetes Neg Hx   . Hyperlipidemia Neg Hx   . Hypertension Neg Hx   . Kidney disease Neg Hx   . Stroke Neg Hx      Prior to Admission medications   Medication Sig Start Date End Date Taking? Authorizing Provider  aspirin EC 81 MG EC tablet Take 1 tablet (81 mg total) by mouth daily. 09/13/12  Yes Brett Canales, PA-C  glucose blood [facility] VERIO) test strip Use TID 10/03/12  Yes Janith Lima, MD  ibuprofen (ADVIL,MOTRIN) 200 MG tablet Take 200 mg by mouth every 6 (six) hours as needed for mild pain or moderate pain. For pain   Yes Historical Provider, MD  nitroGLYCERIN (NITROSTAT) 0.4 MG SL tablet Place 0.4 mg under the tongue every 5 (five) minutes as needed for chest pain. 09/13/12  Yes Brett Canales, PA-C  amLODipine (NORVASC) 5 MG tablet Take 1 tablet (5 mg total) by mouth 2 (two) times daily. Patient not taking:  Reported on 04/11/2015 08/14/14   Dayna N Dunn, PA-C  atorvastatin (LIPITOR) 80 MG tablet Take 1 tablet (80 mg total) by mouth daily with supper. Patient not taking: Reported on 04/11/2015 08/14/14   Dayna N Dunn, PA-C  hydrochlorothiazide (HYDRODIURIL) 25 MG tablet Take 1 tablet (25 mg total) by mouth daily. Patient not taking: Reported on 04/11/2015 08/24/14   Dayna N Dunn, PA-C  insulin aspart (NOVOLOG) 100 UNIT/ML FlexPen Inject 10 Units into the skin 3 (three) times daily with meals. Patient not taking: Reported on 04/11/2015 08/04/14   Donella Stade, PA-C  Insulin Glargine (LANTUS SOLOSTAR) 100 UNIT/ML Solostar Pen Inject 31 units [place] at bedtime. Patient not taking: Reported on 04/11/2015 08/04/14   Donella Stade, PA-C  isosorbide mononitrate (IMDUR) 60 MG 24 hr  tablet Take 1 tablet (60 mg total) by mouth daily. Patient not taking: Reported on 04/11/2015 08/14/14   Dayna N Dunn, PA-C  lisinopril (PRINIVIL,ZESTRIL) 40 MG tablet Take 1 tablet (40 mg total) by mouth daily with breakfast. Patient not taking: Reported on 04/11/2015 08/14/14   Dayna N Dunn, PA-C  metFORMIN (GLUCOPHAGE XR) 750 MG 24 hr tablet Take 1 tablet (750 mg total) by mouth daily with breakfast. Patient not taking: Reported on 04/11/2015 08/04/14   Royetta Car Breeback, PA-C  metoprolol (LOPRESSOR) 100 MG tablet Take 2 tablets (200 mg total) by mouth 2 (two) times daily. Patient not taking: Reported on 04/11/2015 08/14/14   Dayna N Dunn, PA-C    No Known Allergies  Physical Exam  Vitals  Blood pressure 187/104, pulse 73, temperature 98.6 F (37 C), temperature source Oral, resp. rate 17, SpO2 97 %.   1. General alert female lying in bed in NAD,    2. Normal affect and insight, Not Suicidal or Homicidal, Awake Alert, Oriented X 3.  3. Mild head tremor (chronic for last 2 years as per patient ), no fascial droop No F.N deficits,, Strength 5/5 all 4 extremities, Sensation intact all 4 extremities, Plantars down going. Normal  finger-to-nose test, and rapid alternate hand movement, negative Romberg sign.  4. Ears and Eyes appear Normal, Conjunctivae clear, PERRLA. Dry Oral Mucosa.  5. Supple Neck, No JVD, No cervical lymphadenopathy appriciated, No Carotid Bruits.  6. Symmetrical Chest wall movement, Good air movement bilaterally, CTAB.  7. RRR, No Gallops, Rubs or Murmurs, No Parasternal Heave.  8. Positive Bowel Sounds, Abdomen Soft, No tenderness, No organomegaly appriciated,No rebound -guarding or rigidity.  9.  No Cyanosis, Normal Skin Turgor, No Skin Rash or Bruise.  10. Good muscle tone,  joints appear normal , no effusions, Normal ROM.  11. No Palpable Lymph Nodes in Neck or Axillae    Data Review  CBC  Recent Labs Lab 04/11/15 1610 04/11/15 1617  WBC 4.0  --   HGB 17.3* 17.7*  HCT 50.1* 52.0*  PLT 186  --   MCV 85.5  --   MCH 29.5  --   MCHC 34.5  --   RDW 12.7  --   LYMPHSABS 1.1  --   MONOABS 0.3  --   EOSABS 0.1  --   BASOSABS 0.0  --    ------------------------------------------------------------------------------------------------------------------  Chemistries   Recent Labs Lab 04/11/15 1610 04/11/15 1617  NA 135 138  K 4.0 3.7  CL 103 101  CO2 21*  --   GLUCOSE 390* 397*  BUN 14 14  CREATININE 0.50 0.30*  CALCIUM 9.4  --   AST 15  --   ALT 15  --   ALKPHOS 98  --   BILITOT 0.7  --    ------------------------------------------------------------------------------------------------------------------ CrCl cannot be calculated (Unknown ideal weight.). ------------------------------------------------------------------------------------------------------------------ No results for input(s): TSH, T4TOTAL, T3FREE, THYROIDAB in the last 72 hours.  Invalid input(s): FREET3   Coagulation profile  Recent Labs Lab 04/11/15 1620  INR 1.66*   ------------------------------------------------------------------------------------------------------------------- No  results for input(s): DDIMER in the last 72 hours. -------------------------------------------------------------------------------------------------------------------  Cardiac Enzymes No results for input(s): CKMB, TROPONINI, MYOGLOBIN in the last 168 hours.  Invalid input(s): CK ------------------------------------------------------------------------------------------------------------------ Invalid input(s): POCBNP   ---------------------------------------------------------------------------------------------------------------  Urinalysis    Component Value Date/Time   COLORURINE YELLOW 10/25/2009 1823   APPEARANCEUR CLOUDY* 10/25/2009 1823   LABSPEC 1.044* 10/25/2009 1823   PHURINE 6.0 10/25/2009 1823   GLUCOSEU >1000* 10/25/2009 1823  HGBUR NEGATIVE 10/25/2009 1823   BILIRUBINUR NEGATIVE 10/25/2009 1823   KETONESUR >80* 10/25/2009 1823   PROTEINUR 100* 10/25/2009 1823   UROBILINOGEN 0.2 10/25/2009 1823   NITRITE POSITIVE* 10/25/2009 1823   LEUKOCYTESUR NEGATIVE 10/25/2009 1823    ----------------------------------------------------------------------------------------------------------------  Imaging results:   Ct Head Wo Contrast  04/11/2015   CLINICAL DATA:  Vision changes, hyperglycemia  EXAM: CT HEAD WITHOUT CONTRAST  TECHNIQUE: Contiguous axial images were obtained from the base of the skull through the vertex without intravenous contrast.  COMPARISON:  MR brain 06/27/2013  FINDINGS: There is no evidence of mass effect, midline shift, or extra-axial fluid collections. There is no evidence of a space-occupying lesion or intracranial hemorrhage. There is no evidence of a cortical-based area of acute infarction. There is an old right posterior parietal lobe infarct with encephalomalacia. There is an old small right cerebellar infarct. There is periventricular white matter low attenuation likely secondary to microangiopathy.  The ventricles and sulci are appropriate for the  patient's age. The basal cisterns are patent.  Visualized portions of the orbits are unremarkable. The visualized portions of the paranasal sinuses and mastoid air cells are unremarkable. Cerebrovascular atherosclerotic calcifications are noted.  The osseous structures are unremarkable.  IMPRESSION: 1. No acute intracranial pathology. 2. Chronic microvascular disease .   Electronically Signed   By: Kathreen Devoid   On: 04/11/2015 16:30    My personal review of EKG: Rhythm NSR, Rate  92 /min, QTc 475 , no Acute ST changes    Assessment & Plan  Principal Problem:   CVA (cerebral infarction) Active Problems:   DM (diabetes mellitus)   CAD - STEMI s/p urgent LAD DES 09/07/12, residual RCA and Dx disease, EF 45-50%   Hypertensive urgency    Double vision/unsteady gait - Patient will be admitted to rule out acute CVA, given her risk factors and previous stroke, uncontrolled blood pressure, and diabetes mellitus, noncompliance with medication, patient will be transferred to [facility] as per neuro request, will obtain MRI brain, MRA head, 2-D echo, carotid Dopplers, monitor on telemetry, she is on aspirin 81 mg daily, will increase to 325 mg oral daily pending her workup, check glycohemoglobin A1c, lipid panel. - If CVA workup is negative, the symptoms might be provoked by hypertensive urgency, and uncontrolled diabetes mellitus.  Hypertensive urgency - Patient did not take any of her medications for the last 6 months, will allow for permissive hypertension until CVA is ruled out, will resume her back on Imdur, Norvasc, lisinopril, her on when necessary hydralazine, and will adjust medication as needed.  Diabetes mellitus, uncontrolled. - Not in DKA, secondary to medication noncompliance, will start her on Lantus 12 units daily, insulin sliding scale every 4 hours(can be transitioned to 3 times a day before meals after passes swallow screen), check glycohemoglobin A1c.  Coronary artery disease -  Denies any chest pain, or shortness of breath, EKG with no acute ST abnormalities, continue with aspirin, will resume statin if LDL more than 70, and well add beta blockers and continue with lisinopril and more stable.  Medication noncompliance -Counseled  Hyperlipidemia - Patient reports statin were stopped at her PCP as cholesterol was controlled, will recheck lipid panel.   DVT Prophylaxis Heparin -    AM Labs Ordered, also please review Full Orders  Family Communication: Admission, patients condition and plan of care including tests being ordered have been discussed with the patient and family members at bedside who indicate understanding and agree with the plan  and Code Status.  Code Status full  Likely DC to  home when stable  Condition GUARDED    Time spent in minutes : 65 minutes    Ellagrace Yoshida M.D on 04/11/2015 at 5:44 PM  Between 7am to 7pm - Pager - [phone]  After 7pm go to [email or web address] - password [email or web address]  And look for the night coverage person covering me after hours  [facility] Office  [phone]

## 2015-04-11 NOTE — ED Notes (Signed)
Bed: WA18 Expected date:  Expected time:  Means of arrival:  Comments: Hold for triage 

## 2015-04-11 NOTE — ED Notes (Signed)
Bed: RESB Expected date:  Expected time:  Means of arrival:  Comments: Hold for triage 3

## 2015-04-11 NOTE — Progress Notes (Signed)
Pt arrived to the floor via carelink;  Patient's vitals were stable and patient had no complaints of pain.  Pt was oriented to the room and floor, and given a stroke mapping booklet.  All patient questions were answered and she is resting comfortable with husband at the bedside.

## 2015-04-12 ENCOUNTER — Inpatient Hospital Stay (HOSPITAL_COMMUNITY): Payer: BLUE CROSS/BLUE SHIELD

## 2015-04-12 DIAGNOSIS — R270 Ataxia, unspecified: Secondary | ICD-10-CM

## 2015-04-12 DIAGNOSIS — H532 Diplopia: Secondary | ICD-10-CM

## 2015-04-12 LAB — LIPID PANEL
Cholesterol: 208 mg/dL — ABNORMAL HIGH (ref 0–200)
HDL: 50 mg/dL (ref >40)
LDL Cholesterol: 139 mg/dL — ABNORMAL HIGH (ref 0–99)
Total CHOL/HDL Ratio: 4.2 RATIO
Triglycerides: 95 mg/dL (ref <150)
VLDL: 19 mg/dL (ref 0–40)

## 2015-04-12 LAB — CBC
HCT: 46.3 % — ABNORMAL HIGH (ref 36.0–46.0)
Hemoglobin: 15.7 g/dL — ABNORMAL HIGH (ref 12.0–15.0)
MCH: 29 pg (ref 26.0–34.0)
MCHC: 33.9 g/dL (ref 30.0–36.0)
MCV: 85.4 fL (ref 78.0–100.0)
Platelets: 182 10*3/uL (ref 150–400)
RBC: 5.42 MIL/uL — ABNORMAL HIGH (ref 3.87–5.11)
RDW: 12.8 % (ref 11.5–15.5)
WBC: 4.7 10*3/uL (ref 4.0–10.5)

## 2015-04-12 LAB — BASIC METABOLIC PANEL
Anion gap: 7 (ref 5–15)
BUN: 13 mg/dL (ref 6–20)
CO2: 24 mmol/L (ref 22–32)
Calcium: 9 mg/dL (ref 8.9–10.3)
Chloride: 109 mmol/L (ref 101–111)
Creatinine, Ser: 0.45 mg/dL (ref 0.44–1.00)
GFR calc Af Amer: >60 mL/min (ref >60)
GFR calc non Af Amer: >60 mL/min (ref >60)
Glucose, Bld: 160 mg/dL — ABNORMAL HIGH (ref 65–99)
Potassium: 3.3 mmol/L — ABNORMAL LOW (ref 3.5–5.1)
Sodium: 140 mmol/L (ref 135–145)

## 2015-04-12 LAB — GLUCOSE, CAPILLARY
Glucose-Capillary: 117 mg/dL — ABNORMAL HIGH (ref 65–99)
Glucose-Capillary: 204 mg/dL — ABNORMAL HIGH (ref 65–99)
Glucose-Capillary: 211 mg/dL — ABNORMAL HIGH (ref 65–99)
Glucose-Capillary: 218 mg/dL — ABNORMAL HIGH (ref 65–99)
Glucose-Capillary: 234 mg/dL — ABNORMAL HIGH (ref 65–99)

## 2015-04-12 MED ORDER — METOPROLOL TARTRATE 100 MG PO TABS
200.0000 mg | ORAL_TABLET | Freq: Two times a day (BID) | ORAL | Status: DC
  Administered 2015-04-12 – 2015-04-13 (×3): 200 mg via ORAL
  Filled 2015-04-12 (×2): qty 2
  Filled 2015-04-12: qty 4

## 2015-04-12 MED ORDER — AMLODIPINE BESYLATE 5 MG PO TABS
5.0000 mg | ORAL_TABLET | Freq: Two times a day (BID) | ORAL | Status: DC
  Administered 2015-04-12 – 2015-04-16 (×9): 5 mg via ORAL
  Filled 2015-04-12 (×9): qty 1

## 2015-04-12 MED ORDER — POTASSIUM CHLORIDE CRYS ER 20 MEQ PO TBCR
40.0000 meq | EXTENDED_RELEASE_TABLET | Freq: Two times a day (BID) | ORAL | Status: AC
Start: 2015-04-12 — End: 2015-04-12
  Administered 2015-04-12 (×2): 40 meq via ORAL
  Filled 2015-04-12 (×2): qty 2

## 2015-04-12 MED ORDER — ATORVASTATIN CALCIUM 10 MG PO TABS
20.0000 mg | ORAL_TABLET | Freq: Every day | ORAL | Status: DC
  Administered 2015-04-12 – 2015-04-13 (×2): 20 mg via ORAL
  Filled 2015-04-12 (×2): qty 2

## 2015-04-12 NOTE — Evaluation (Signed)
Speech Language Pathology Evaluation Patient Details Name: Marisa Forbes MRN: [id number] DOB: 1951/06/06 Today's Date: 04/12/2015 Time: 1000-1025 SLP Time Calculation (min) (ACUTE ONLY): 25 min  Problem List:  Patient Active Problem List   Diagnosis Date Noted  . CVA (cerebral infarction) 04/11/2015  . Hypertensive urgency 04/11/2015  . Renal artery stenosis   . LV dysfunction   . Vasovagal syncope   . Benign paroxysmal positional vertigo 05/19/2013  . Ataxia 05/17/2013  . Cancer of upper-inner quadrant of female breast 12/06/2012  . Cervical dystonia 10/18/2012  . Peripheral neuropathy 10/18/2012  . Other screening mammogram 10/03/2012  . Stroke, embolic 76/73/4193    Class: Acute  . DM (diabetes mellitus) 09/09/2012  . Hyperlipidemia with target LDL less than 70 09/09/2012  . CAD - STEMI s/p urgent LAD DES 09/07/12, residual RCA and Dx disease, EF 45-50% 09/09/2012  . NSVT, 5 beats 11/10 09/09/2012  . Essential hypertension 09/07/2012   Past Medical History:  Past Medical History  Diagnosis Date  . HTN (hypertension)   . Diabetes mellitus without complication   . Myocardial infarction 09/07/12  . Breast cancer 12/03/12    a. 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 S/p chemo with CMF and radiation.  Marland Kitchen History of radiation therapy 06/23/2013-08/08/2013    62.4 gray to left breast  . Hypercholesteremia   . Coronary artery disease     a. STEMI 08/2012: s/p DES to LAD, PTCA to downstream LAD. b. Relook cath same hospitalization: stable post-PCI anatomy with Stable Diag lesions (unlikely cause of resting angina, not good PCI targets), stable RCA lesion (non flow limiting).  . Stroke, embolic     a. 79/0240 post cath.  . Peripheral vision loss     left  . Balance problems     due to stroke  . History of cancer chemotherapy     last in August 2014  . GERD (gastroesophageal reflux disease)     food related  . Arthritis     back  . Renal  artery stenosis     a. 20% L RAS in 08/2012 by cath. b. Duplex 07/2012: >60% L RAS.  . LV dysfunction     a. EF 45-50% at time of STEMI 08/2012. b. Echo 04/2013: EF 45-50%, mild focal basal hypertrophy of septum, grade 1 d/d.  Marland Kitchen Vasovagal syncope     a. 04/2013 - echo stable compared to prior EF 45-50%, negative carotid duplex.   Past Surgical History:  Past Surgical History  Procedure Laterality Date  . Coronary angioplasty with stent placement    . Breast lumpectomy with needle localization and axillary sentinel lymph node bx Left 01/08/2013    Procedure: LEFT BREAST WIRE GUIDED  LUMPECTOMY AND LEFT AXILLARY SENTINEL  NODE BX;  Surgeon: Rolm Bookbinder, MD;  Location: [place];  Service: General;  Laterality: Left;  . Portacath placement Right 02/17/2013    Procedure: INSERTION PORT-A-CATH;  Surgeon: Rolm Bookbinder, MD;  Location: [place];  Service: General;  Laterality: Right;  . Back surgery      x3, lower back  . Cataract extraction Bilateral   . Port-a-cath removal N/A 10/14/2013    Procedure: REMOVAL PORT-A-CATH;  Surgeon: Rolm Bookbinder, MD;  Location: [facility];  Service: General;  Laterality: N/A;  . Left heart catheterization with coronary angiogram N/A 09/07/2012    Procedure: LEFT HEART CATHETERIZATION WITH CORONARY ANGIOGRAM;  Surgeon: Troy Sine, MD;  Location: [facility] CATH LAB;  Service: Cardiovascular;  Laterality:  N/A;  . Percutaneous coronary stent intervention (pci-s) N/A 09/07/2012    Procedure: PERCUTANEOUS CORONARY STENT INTERVENTION (PCI-S);  Surgeon: Troy Sine, MD;  Location: [facility] CATH LAB;  Service: Cardiovascular;  Laterality: N/A;  . Left heart catheterization with coronary angiogram  09/09/2012    Procedure: LEFT HEART CATHETERIZATION WITH CORONARY ANGIOGRAM;  Surgeon: Leonie Man, MD;  Location: [facility] CATH LAB;  Service: Cardiovascular;;   HPI:  64 yo female adm to [facility] with cva symptoms.  PMH + for prior cva with hemianopsia and mild unsteadiness residuals,  breast cancer, MI, HTN.  Pt MRI negative for acute event.  Speech eval ordered.     Assessment / Plan / Recommendation Clinical Impression  Pt presents with only minimal memory ant attention deficits per subset of MOCA 7.3.  She scored 25/30 with normal score being 26/30.  Speech is fluent and clear without evidence of dysarthria.    SLP provided pt with memory compensation strategies verbally and in writing.  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  Vocation: Full time employment   Cognition  Overall Cognitive Status: Within Functional Limits for tasks assessed Arousal/Alertness: Awake/alert Orientation Level: Oriented X4 Attention: Sustained;Selective Focused Attention: Appears intact Sustained Attention: Appears intact Selective Attention: Appears intact Memory: Impaired (pt recalled 2/5 words independently, 1 with category cue, 2 m/c) Memory Impairment: Retrieval deficit Awareness: Appears intact Problem Solving: Appears intact Safety/Judgment: Appears intact    Comprehension  Auditory Comprehension Overall Auditory Comprehension: Appears within functional limits for tasks assessed Yes/No Questions: Not tested Commands: Within Functional Limits Conversation: Complex Visual Recognition/Discrimination Discrimination: Within Function Limits Reading Comprehension Reading Status: Within funtional limits    Expression Expression Primary Mode of Expression: Verbal Verbal  Expression Overall Verbal Expression: Appears within functional limits for tasks assessed Initiation: No impairment Repetition: No impairment Naming: No impairment Pragmatics: No impairment Written Expression Dominant Hand: Right Written Expression: Within Functional Limits   Oral / Motor Oral Motor/Sensory Function Overall Oral Motor/Sensory Function: Appears within functional limits for tasks assessed Motor Speech Overall Motor Speech: Appears within functional limits for tasks assessed Respiration: Within functional limits Phonation: Normal Resonance: Within functional limits Articulation: Within functional limitis Intelligibility: Intelligible Motor Planning: Witnin functional limits Motor Speech Errors: Not applicable   [place], Sebastian [facility] SLP [phone]

## 2015-04-12 NOTE — Progress Notes (Signed)
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  ECHO pending.  Doppler no significant stenosis.  Risk factors modification.  Continue with IV fluids, Hb at 15.  MRI; negative for acute stroke, showed old strokes. \ Continue with aspirin.  LDL: 139 on Lipitor.   2-HTN, malignant;  Presents with SBP 227/119 SBP has decrease to 100. I will adjust medications to avoid hypotension.  I will hold Imdur, lisinopril.  Will continue with Norvasc.  Counseling provide to patient regarding importance of good BP controlled.   Diabetes mellitus, uncontrolled. Was not taking her insulin.  Follow HbA1c.  Continue with lantus.  Blood sugar better controlled.   Elevated Hb; Continue with IV fluids,. Repeat CBC in am.   New large L parotid mass 8x22mm; needs follow up . Patient aware of results.   Coronary artery disease; continue with aspirin, metoprolol.   Code Status: Full Code.  Family Communication: care discussed with patient.  Disposition Plan: likely home after hydration and work up completed.    Consultants: Neurology  Procedures:  ECHO pending.  Carotid doppler: Findings suggest 1-39% internal carotid artery stenosis bilaterally. Vertebral arteries are patent with antegrade flow.  Antibiotics:  none  HPI/Subjective: Feeling better, double vision better  Objective: Filed Vitals:   04/12/15 1303  BP: 158/78  Pulse: 79  Temp: 98 F (36.7 C)  Resp: 20    Intake/Output Summary (Last 24 hours) at 04/12/15 1423 Last data filed at 04/12/15 1305  Gross per 24 hour  Intake    240 ml  Output      0 ml  Net    240 ml   There were no vitals filed for this visit.  Exam:   General:  Alert in no distress.   Cardiovascular: S 1, S 2 RRR  Respiratory: CTA  Abdomen: BS present, soft,  nt  Musculoskeletal: no edema  Neuro; double vision better.    Data Reviewed: Basic Metabolic Panel:  Recent Labs Lab 04/11/15 1610 04/11/15 1617 04/11/15 1930 04/12/15 0810  NA 135 138  --  140  K 4.0 3.7  --  3.3*  CL 103 101  --  109  CO2 21*  --   --  24  GLUCOSE 390* 397*  --  160*  BUN 14 14  --  13  CREATININE 0.50 0.30* 0.43* 0.45  CALCIUM 9.4  --   --  9.0   Liver Function Tests:  Recent Labs Lab 04/11/15 1610  AST 15  ALT 15  ALKPHOS 98  BILITOT 0.7  PROT 7.0  ALBUMIN 4.3   No results for input(s): LIPASE, AMYLASE in the last 168 hours. No results for input(s): AMMONIA in the last 168 hours. CBC:  Recent Labs Lab 04/11/15 1610 04/11/15 1617 04/11/15 1930 04/12/15 0810  WBC 4.0  --  4.3 4.7  NEUTROABS 2.6  --   --   --   HGB 17.3* 17.7* 17.3* 15.7*  HCT 50.1* 52.0* 48.2* 46.3*  MCV 85.5  --  83.0 85.4  PLT 186  --  167 182   Cardiac Enzymes: No results for input(s): CKTOTAL, CKMB, CKMBINDEX, TROPONINI in the last 168 hours. BNP (last 3 results) No results for input(s): BNP in the last 8760 hours.  ProBNP (last 3 results) No results for input(s): PROBNP in the last 8760 hours.  CBG:  Recent Labs Lab 04/11/15 1759 04/11/15 2035 04/12/15 0022 04/12/15 0506 04/12/15 1208  GLUCAP 244* 136* 204* 117* 234*    No results found for this or any previous visit (from the past 240 hour(s)).   Studies: Ct Head Wo Contrast  04/11/2015   CLINICAL DATA:  Vision changes, hyperglycemia  EXAM: CT HEAD WITHOUT CONTRAST  TECHNIQUE: Contiguous axial images were obtained from the base of the skull through the vertex without intravenous contrast.  COMPARISON:  MR brain 06/27/2013  FINDINGS: There is no evidence of mass effect, midline shift, or extra-axial fluid collections. There is no evidence of a space-occupying lesion or intracranial hemorrhage. There is no evidence of a cortical-based area of acute infarction. There is an old right posterior parietal  lobe infarct with encephalomalacia. There is an old small right cerebellar infarct. There is periventricular white matter low attenuation likely secondary to microangiopathy.  The ventricles and sulci are appropriate for the patient's age. The basal cisterns are patent.  Visualized portions of the orbits are unremarkable. The visualized portions of the paranasal sinuses and mastoid air cells are unremarkable. Cerebrovascular atherosclerotic calcifications are noted.  The osseous structures are unremarkable.  IMPRESSION: 1. No acute intracranial pathology. 2. Chronic microvascular disease .   Electronically Signed   By: Kathreen Devoid   On: 04/11/2015 16:30   Mr Brain Wo Contrast  04/11/2015   CLINICAL DATA:  Diplopia, worsening balance beginning yesterday at 3 p.m. History of stroke, diabetes, hypertension, breast cancer.  EXAM: MRI HEAD WITHOUT CONTRAST  MRA HEAD WITHOUT CONTRAST  TECHNIQUE: Multiplanar, multiecho pulse sequences of the brain and surrounding structures were obtained without intravenous contrast. Angiographic images of the head were obtained using MRA technique without contrast.  COMPARISON:  CT head April 11, 2015 at 1626 hours and MRI of the brain June 27, 2013  FINDINGS: MRI HEAD FINDINGS  No reduced diffusion to suggest acute ischemia. New subcentimeter focus of susceptibility artifact RIGHT pons, LEFT mesial temporal lobe, with additional scattered small foci of susceptibility artifact which were present previously. Old RIGHT occipital lobe hemorrhagic infarct. Ventricles and sulci are normal for patient's age. Patchy supratentorial, pontine and RIGHT cerebellar white matter T2 hyperintensities are advanced from prior imaging. No midline shift or mass effect. Remote small RIGHT inferior cerebellar infarcts were present previously. Small rib remote RIGHT brachium pontis infarct. Remote RIGHT thalamus lacunar infarcts.  No abnormal extra-axial fluid collections. Status post bilateral ocular  lens implants. Paranasal sinuses and mastoid air cells are well aerated. 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 No abnormal sellar expansion. No cerebellar tonsillar ectopia. No suspicious calvarial bone marrow signal. Scattered scalp small sebaceous cysts. New 8 x 13 mm low signal mass LEFT parotid gland with reduced diffusion.  MRA HEAD FINDINGS  Anterior circulation: Normal flow related enhancement of the included cervical, petrous, cavernous and supra clinoid internal carotid arteries. Moderate symmetric stenosis of the proximal supraclinoid internal carotid arteries, likely due to atherosclerosis. Patent anterior communicating artery. Moderate stenosis LEFT A1 segment origin. Patent anterior communicating artery with normal flow related enhancement of the anterior cerebral arteries. Moderate luminal irregularity/stenosis LEFT M1 segment. Bilateral middle cerebral arteries are patent including mid to distal segments.  No large vessel occlusion, high-grade stenosis, aneurysm.  Posterior circulation: RIGHT vertebral artery is dominant. Basilar artery is patent, with normal flow related enhancement of the main branch vessels. Moderate stenosis distal basilar artery at the level of the superior cerebellar artery origins. Thready, irregular LEFT V4 segment flow related enhancement, poorly visualized  distally. Moderate stenosis mid to distal RIGHT posterior cerebral artery. Bilateral posterior cerebral arteries are patent.  No large vessel occlusion, high-grade stenosis, aneurysm.  IMPRESSION: MRI HEAD: No acute intracranial process, specifically no acute ischemia.  Old RIGHT hemorrhagic posterior cerebral artery territory infarct. Additional areas of susceptibility artifact are progressed from prior examination suggesting sequelae of chronic hypertension, unlikely to represent hemorrhagic metastasis. Moderate advanced white matter changes most consistent chronic small vessel ischemic disease. Old small  infarcts as described above.  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  MRA HEAD: No large vessel occlusion. Thready, irregular LEFT vertebral artery with poor visualization distal V4 segment which can be seen with slow flow or, high-grade stenosis.  Moderate luminal irregularity/ stenosis of the anterior and posterior circulation consistent with atherosclerosis.   Electronically Signed   By: Elon Alas M.D.   On: 04/11/2015 23:06   Mr Jodene Nam Head/brain Wo Cm  04/11/2015   CLINICAL DATA:  Diplopia, worsening balance beginning yesterday at 3 p.m. History of stroke, diabetes, hypertension, breast cancer.  EXAM: MRI HEAD WITHOUT CONTRAST  MRA HEAD WITHOUT CONTRAST  TECHNIQUE: Multiplanar, multiecho pulse sequences of the brain and surrounding structures were obtained without intravenous contrast. Angiographic images of the head were obtained using MRA technique without contrast.  COMPARISON:  CT head April 11, 2015 at 1626 hours and MRI of the brain June 27, 2013  FINDINGS: MRI HEAD FINDINGS  No reduced diffusion to suggest acute ischemia. New subcentimeter focus of susceptibility artifact RIGHT pons, LEFT mesial temporal lobe, with additional scattered small foci of susceptibility artifact which were present previously. Old RIGHT occipital lobe hemorrhagic infarct. Ventricles and sulci are normal for patient's age. Patchy supratentorial, pontine and RIGHT cerebellar white matter T2 hyperintensities are advanced from prior imaging. No midline shift or mass effect. Remote small RIGHT inferior cerebellar infarcts were present previously. Small rib remote RIGHT brachium pontis infarct. Remote RIGHT thalamus lacunar infarcts.  No abnormal extra-axial fluid collections. Status post bilateral ocular lens implants. Paranasal sinuses and mastoid air cells are well aerated. Moderate  to severe temporomandibular osteoarthrosis. No abnormal sellar expansion. No cerebellar tonsillar ectopia. No suspicious calvarial bone marrow signal. Scattered scalp small sebaceous cysts. New 8 x 13 mm low signal mass LEFT parotid gland with reduced diffusion.  MRA HEAD FINDINGS  Anterior circulation: Normal flow related enhancement of the included cervical, petrous, cavernous and supra clinoid internal carotid arteries. Moderate symmetric stenosis of the proximal supraclinoid internal carotid arteries, likely due to atherosclerosis. Patent anterior communicating artery. Moderate stenosis LEFT A1 segment origin. Patent anterior communicating artery with normal flow related enhancement of the anterior cerebral arteries. Moderate luminal irregularity/stenosis LEFT M1 segment. Bilateral middle cerebral arteries are patent including mid to distal segments.  No large vessel occlusion, high-grade stenosis, aneurysm.  Posterior circulation: RIGHT vertebral artery is dominant. Basilar artery is patent, with normal flow related enhancement of the main branch vessels. Moderate stenosis distal basilar artery at the level of the superior cerebellar artery origins. Thready, irregular LEFT V4 segment flow related enhancement, poorly visualized distally. Moderate stenosis mid to distal RIGHT posterior cerebral artery. Bilateral posterior cerebral arteries are patent.  No large vessel occlusion, high-grade stenosis, aneurysm.  IMPRESSION: MRI HEAD: No acute intracranial process, specifically no acute ischemia.  Old RIGHT hemorrhagic posterior cerebral artery territory infarct. Additional areas of susceptibility artifact are progressed from prior examination suggesting sequelae of chronic  hypertension, unlikely to represent hemorrhagic metastasis. Moderate advanced white matter changes most consistent chronic small vessel ischemic disease. Old small infarcts as described above.  New 8 x 13 mm mass LEFT parotid gland which could  represent intra parotid lymph node though considering somewhat suspicious imaging characteristics, recommend dedicated follow-up.  MRA HEAD: No large vessel occlusion. Thready, irregular LEFT vertebral artery with poor visualization distal V4 segment which can be seen with slow flow or, high-grade stenosis.  Moderate luminal irregularity/ stenosis of the anterior and posterior circulation consistent with atherosclerosis.   Electronically Signed   By: Elon Alas M.D.   On: 04/11/2015 23:06    Scheduled Meds: . amLODipine  5 mg Oral BID  . aspirin  300 mg Rectal Daily   Or  . aspirin  325 mg Oral Daily  . atorvastatin  20 mg Oral q1800  . heparin  5,000 Units Subcutaneous 3 times per day  . insulin aspart  0-9 Units Subcutaneous 6 times per day  . insulin glargine  12 Units Subcutaneous QHS   Continuous Infusions: . sodium chloride 100 mL/hr at 04/12/15 0715    Principal Problem:   CVA (cerebral infarction) Active Problems:   DM (diabetes mellitus)   CAD - STEMI s/p urgent LAD DES 09/07/12, residual RCA and Dx disease, EF 45-50%   Hypertensive urgency   Diplopia    Time spent: 30 minutes.     Niel Hummer A  [facility] Pager [phone]. If 7PM-7AM, please contact night-coverage at [email or web address], password [facility] 04/12/2015, 2:23 PM  LOS: 1 day

## 2015-04-12 NOTE — Evaluation (Signed)
Occupational Therapy Evaluation and Discharge Patient Details Name: Marisa Forbes MRN: [id number] DOB: 03-Jun-1951 Today's Date: 04/12/2015    History of Present Illness Marisa Forbes is a 64 y.o. female, with past medical history of breast cancer, hypertension, diabetes mellitus, coronary artery disease, embolic CVA in the past, since with complaint of double vision, and unsteady gait, patient reportedly from a previous CVA she had residual left visual field deficit, and poor balance, but reports yesterday afternoon she noticed herself to have double vision, and having more wobbly gait and usual, she presents to ED today, CT showing no evidence of acute intracranial finding   Clinical Impression   This 64 yo female admitted with above presents to acute OT at a Mod I to independent level, acute OT will sign off.    Follow Up Recommendations  No OT follow up    Equipment Recommendations  None recommended by OT       Precautions / Restrictions Precautions Precautions: None Restrictions Weight Bearing Restrictions: No      Mobility Bed Mobility               General bed mobility comments: Pt being brought back to room via tranporter from test in a W/C  Transfers Overall transfer level: Modified independent Equipment used: None                       ADL Overall ADL's : Modified independent                                             Vision Additional Comments: Pt now reports that she has intermittent double vision, but cannot tell me that she has noticed it more far or near, left or right, up or down. With testing she notes a brief moment in her right lower nasal field double vision but it corrected itself almost as soon as she noticed it. Shes does have prior history of left eye perpherial visual field cut from CVA two years ago and has been given the OK to start drive again per her report.          Pertinent Vitals/Pain Pain  Assessment: No/denies pain     Hand Dominance Right   Extremity/Trunk Assessment Upper Extremity Assessment Upper Extremity Assessment: Overall WFL for tasks assessed   Lower Extremity Assessment Lower Extremity Assessment: Defer to PT evaluation       Communication Communication Communication: No difficulties   Cognition Arousal/Alertness: Awake/alert Behavior During Therapy: WFL for tasks assessed/performed Overall Cognitive Status: Within Functional Limits for tasks assessed                                Home Living Family/patient expects to be discharged to:: Private residence Living Arrangements: Spouse/significant other Available Help at Discharge: Family;Available 24 hours/day (husband is no disability) Type of Home: House Home Access: Ramped entrance     Home Layout: One level     Bathroom Shower/Tub: Tub/shower unit;Curtain Shower/tub characteristics: Architectural technologist: Standard     Home Equipment:  (husband has canes)      Lives With: Spouse    Prior Functioning/Environment Level of Independence: Independent             OT Diagnosis: Generalized weakness  OT Goals(Current goals can be found in the care plan section) Acute Rehab OT Goals Patient Stated Goal: home tomorrow hopefully  OT Frequency:                End of Session Equipment Utilized During Treatment:  (none)  Activity Tolerance: Patient tolerated treatment well Patient left: in chair;with call bell/phone within reach   Time: 1139-1158 OT Time Calculation (min): 19 min Charges:  OT General Charges $OT Visit: 1 Procedure OT Evaluation $Initial OT Evaluation Tier I: 1 Procedure  Almon Register [phone] 04/12/2015, 12:14 PM

## 2015-04-12 NOTE — Progress Notes (Signed)
STROKE TEAM PROGRESS NOTE   HISTORY Marisa Forbes is an 64 y.o. 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 She initially presented to [facility] with complains of double vision and worsening balance that started yesterday 04/10/2015 around 3 pm ([facility]). Patient indicated that she gets off balance easily but she never had double vision before and her balance has ben worse since yesterday. She realized that her vision was double while watching TV but will go away after closing one eye. Denies associated HA, vertigo, difficulty swallowing, focal weakness or numbness,slurred speech, or language impairment. Upon arrival to [facility] was noticed to have uncontrolled blood pressure 227/119, and also with uncontrolled blood sugar (patient admits to not taking her medications for several months). CT brain was reviewed and showed no acute abnormality. Patient was not administered TPA secondary to out of the window. She was admitted for further evaluation and treatment.   SUBJECTIVE (INTERVAL HISTORY) No family is at the bedside.  Overall she feels her condition is stable. She asks about botox for her head tremor - she has been seen at [facility] in the past.   OBJECTIVE Temp:  [97.5 F (36.4 C)-98.7 F (37.1 C)] 98.7 F (37.1 C) (06/13 0956) Pulse Rate:  [64-99] 82 (06/13 0956) Cardiac Rhythm:  [-] Normal sinus rhythm (06/13 0633) Resp:  [17-23] 20 (06/13 0956) BP: (91-227)/(68-119) 143/84 mmHg (06/13 0956) SpO2:  [93 %-98 %] 98 % (06/13 0956)   Recent Labs Lab 04/11/15 1513 04/11/15 1759 04/11/15 2035 04/12/15 0022 04/12/15 0506  GLUCAP 442* 244*  136* 204* 117*    Recent Labs Lab 04/11/15 1610 04/11/15 1617 04/11/15 1930 04/12/15 0810  NA 135 138  --  140  K 4.0 3.7  --  3.3*  CL 103 101  --  109  CO2 21*  --   --  24  GLUCOSE 390* 397*  --  160*  BUN 14 14  --  13  CREATININE 0.50 0.30* 0.43* 0.45  CALCIUM 9.4  --   --  9.0    Recent Labs Lab 04/11/15 1610  AST 15  ALT 15  ALKPHOS 98  BILITOT 0.7  PROT 7.0  ALBUMIN 4.3    Recent Labs Lab 04/11/15 1610 04/11/15 1617 04/11/15 1930 04/12/15 0810  WBC 4.0  --  4.3 4.7  NEUTROABS 2.6  --   --   --   HGB 17.3* 17.7* 17.3* 15.7*  HCT 50.1* 52.0* 48.2* 46.3*  MCV 85.5  --  83.0 85.4  PLT 186  --  167 182   No results for input(s): CKTOTAL, CKMB, CKMBINDEX, TROPONINI in the last 168 hours.  Recent Labs  04/11/15 1620  LABPROT 19.7*  INR 1.66*    Recent Labs  04/11/15 1807  COLORURINE YELLOW  LABSPEC 1.020  PHURINE 6.5  GLUCOSEU >1000*  HGBUR NEGATIVE  BILIRUBINUR NEGATIVE  KETONESUR NEGATIVE  PROTEINUR NEGATIVE  UROBILINOGEN 0.2  NITRITE NEGATIVE  LEUKOCYTESUR SMALL*       Component Value Date/Time   CHOL 208* 04/12/2015 0319   TRIG 95 04/12/2015 0319   HDL 50 04/12/2015 0319   CHOLHDL 4.2 04/12/2015 0319   VLDL 19 04/12/2015 0319   LDLCALC  139* 04/12/2015 0319   Lab Results  Component Value Date   HGBA1C 7.1* 06/26/2013      Component Value Date/Time   LABOPIA NONE DETECTED 04/11/2015 1807   COCAINSCRNUR NONE DETECTED 04/11/2015 1807   LABBENZ NONE DETECTED 04/11/2015 1807   AMPHETMU NONE DETECTED 04/11/2015 1807   THCU NONE DETECTED 04/11/2015 1807   LABBARB NONE DETECTED 04/11/2015 1807     Recent Labs Lab 04/11/15 1610  ETH <5    Ct Head Wo Contrast 04/11/2015    1. No acute intracranial pathology. 2. Chronic microvascular disease .     MRI HEAD 04/11/2015  No acute intracranial process, specifically no acute ischemia.  Old RIGHT hemorrhagic posterior cerebral artery territory infarct. Additional areas of  susceptibility artifact are progressed from prior examination suggesting sequelae of chronic hypertension, unlikely to represent hemorrhagic metastasis. Moderate advanced white matter changes most consistent chronic small vessel ischemic disease. Old small infarcts as described above.  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    MRA HEAD 04/11/2015  No large vessel occlusion. Thready, irregular LEFT vertebral artery with poor visualization distal V4 segment which can be seen with slow flow or, high-grade stenosis.  Moderate luminal irregularity/ stenosis of the anterior and posterior circulation consistent with atherosclerosis.     Carotid Doppler  1-39% internal carotid artery stenosis bilaterally. Vertebral arteries are patent with antegrade flow   PHYSICAL EXAM Pleasant middle-age Caucasian lady currently not in distress. . Afebrile. Head is nontraumatic. Neck is supple without bruit.    Cardiac exam no murmur or gallop. Lungs are clear to auscultation. Distal pulses are well felt. Neurological Exam :  Awake alert oriented 3 with normal speech and language function. Memory and attention are intact. Extraocular movements are full range without nystagmus. Fundi were not visualized. Vision acuity seems adequate. Dense left homonymous hemianopsia. Mild subjective diplopia upon accommodation only. No skew eye deviation. Face is symmetric without weakness. Tongue is midline. Motor system exam revealed no upper or lower extremity drift. Symmetric and equal strength in all 4 extremities. Intermittent head tremor. Deep tendon reflexes are 2+ symmetric. Plantars are downgoing. Gait was not tested. ASSESSMENT/PLAN Ms. Marisa Forbes is a 64 y.o. female with history of HTN, DM type 2, hypercholesterolemia, CAD s/p stenting, MI, embolic right cerebellar and right occipital  parietal stroke in 2014 with residual left hemianopia and unsteadiness, breast cancer s/p left lumpectomy and XRT, syncope, head tremors, and poor adherence to tremors presenting to [facility] with diplopia and worsening dysequilibrium. She did not receive IV t-PA due to delay in arrival.   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    Resultant  Vertical diplopia  MRI  No new stroke, new large L parotid mass 8x36mm  MRA  No large bessel stenosis  Carotid Doppler  No significant stenosis   2D Echo  pending   LDL 139  HgbA1c pending  Heparin 5000 units sq tid for VTE prophylaxis Diet heart healthy/carb modified Room service appropriate?: Yes; Fluid consistency:: Thin  aspirin 81 mg orally every day prior to admission, now on aspirin 325 mg orally every day  Therapy recommendations:  No PT needs  Disposition:  Anticipate return home  Essential Hypertension  Hyperlipidemia  Home meds:  No statin  LDL 139, goal < 70  Added statin, lipitor 20  Continue statin at discharge  Diabetes type II  HgbA1c pending, goal < 7.0  Other Stroke Risk Factors  Hx stroke/TIA - incidental post cath embolic 97/9480 without residual deficit noted during hospitalization but with L HH found at follow up w/ TAT, ? Related  Coronary artery disease - MI, STEMI w/ PCI, DES 2013  Essential Head Tremor, chronic d/t cervical dystonia  Has received botox in the past  Seen by Dr. Carles Collet in the past. Pt prefers to follow up with 1 neurologist - can be seen at [facility], does not have to be Leonie Man, as Janann Colonel is no longer with the office, any MD will do  Hx Vertigo and Gait Ataxia  Fulls resolved at time of OV in 2014  Felt to be d/t BPPV vs vascular/ischemi vs neuropathy  Seen by Dr. Janann Colonel in the past  Other Active Problems  Peripheral neuropathy secondary to uncontrolled diabetes  L parotid mass - needs follow up  Other Pertinent History  Breast cancer 2014 s/p L lumpectomy,  chemo, XRT  Hospital day # [place] for Pager information 04/12/2015 1:53 PM  I have personally examined this patient, reviewed notes, independently viewed imaging studies, participated in medical decision making and plan of care. I have made any additions or clarifications directly to the above note. Agree with note above. She presented with transient vertical diplopia which seems to be improving and worsening of mild gait difficulty. She possibly had a small brainstem TIA from small vessel disease versus diabetic partial third nerve palsy. She has had a previous stroke and remains at risk for recurrent strokes, TIAs or neurological worsening and needs ongoing stroke evaluation and aggressive risk factor modification. Continue aspirin for stroke prevention. Patient was counseled to be compliant with her medications  Antony Contras, MD Medical Director [place] Pager: [phone] 04/12/2015 2:04 PM    To contact Stroke Continuity provider, please refer to [email or web address] After hours, contact General Neurology

## 2015-04-12 NOTE — Progress Notes (Signed)
PT Cancellation Note  Patient Details Name: ZAKYA HALABI MRN: [id number] DOB: [birth year]   Cancelled Treatment:    Reason Eval/Treat Not Completed: Patient not medically ready.  Pt currently on bedrest.  Please advance activity order when appropriate.     Lakrisha Iseman, [place] Papas 04/12/2015, 8:05 AM

## 2015-04-12 NOTE — Progress Notes (Signed)
OT Cancellation Note  Patient Details Name: Marisa Forbes MRN: [id number] DOB: 01/17/1951   Cancelled Treatment:    Reason Eval/Treat Not Completed: Patient not medically ready. Pt still currently on bedrest.  Almon Register [phone] 04/12/2015, 8:12 AM

## 2015-04-12 NOTE — Progress Notes (Signed)
*  PRELIMINARY RESULTS* Vascular Ultrasound Carotid Duplex (Doppler) has been completed.  Findings suggest 1-39% internal carotid artery stenosis bilaterally. Vertebral arteries are patent with antegrade flow.  04/12/2015 12:01 PM Maudry Mayhew, RVT, RDCS, RDMS

## 2015-04-12 NOTE — Evaluation (Signed)
Physical Therapy Evaluation Patient Details Name: Marisa Forbes MRN: [id number] DOB: 09/30/1951 Today's Date: 04/12/2015   History of Present Illness  Marisa Forbes is a 64 y.o. female, with past medical history of breast cancer, hypertension, diabetes mellitus, coronary artery disease, embolic CVA in the past, since with complaint of double vision, and unsteady gait, patient reportedly from a previous CVA she had residual left visual field deficit, and poor balance, but reports yesterday afternoon she noticed herself to have double vision, and having more wobbly gait and usual, she presents to ED today, CT showing no evidence of acute intracranial finding  Clinical Impression  Pt seems to be moving at baseline level per pt report.  Pt indicates she normally ambulates somewhat guarded due to her previous CVA, but no new deficits.  Pt with no acute PT needs at this time, will sign off.      Follow Up Recommendations No PT follow up;Supervision - Intermittent    Equipment Recommendations  None recommended by PT    Recommendations for Other Services       Precautions / Restrictions Precautions Precautions: None Restrictions Weight Bearing Restrictions: No      Mobility  Bed Mobility Overal bed mobility: Independent             General bed mobility comments: Pt being brought back to room via tranporter from test in a W/C  Transfers Overall transfer level: Modified independent Equipment used: None                Ambulation/Gait Ambulation/Gait assistance: Modified independent (Device/Increase time) Ambulation Distance (Feet): 300 Feet Assistive device: None Gait Pattern/deviations: Step-through pattern;Decreased stride length     General Gait Details: pt moves somewhat guarded, but states she has done this since her previous CVA due to L visual field cut.    Stairs            Wheelchair Mobility    Modified Rankin (Stroke Patients Only) Modified  Rankin (Stroke Patients Only) Pre-Morbid Rankin Score: No significant disability Modified Rankin: No significant disability     Balance Overall balance assessment: No apparent balance deficits (not formally assessed)                                           Pertinent Vitals/Pain Pain Assessment: No/denies pain    Home Living Family/patient expects to be discharged to:: Private residence Living Arrangements: Spouse/significant other Available Help at Discharge: Family;Available 24 hours/day (husband is no disability) Type of Home: House Home Access: Ramped entrance     Home Layout: One level Home Equipment:  (husband has canes)      Prior Function Level of Independence: Independent               Hand Dominance   Dominant Hand: Right    Extremity/Trunk Assessment   Upper Extremity Assessment: Defer to OT evaluation           Lower Extremity Assessment: Overall WFL for tasks assessed      Cervical / Trunk Assessment: Normal  Communication   Communication: No difficulties  Cognition Arousal/Alertness: Awake/alert Behavior During Therapy: WFL for tasks assessed/performed Overall Cognitive Status: Within Functional Limits for tasks assessed                      General Comments      Exercises  Assessment/Plan    PT Assessment Patent does not need any further PT services  PT Diagnosis Difficulty walking   PT Problem List    PT Treatment Interventions     PT Goals (Current goals can be found in the Care Plan section) Acute Rehab PT Goals Patient Stated Goal: home tomorrow hopefully PT Goal Formulation: All assessment and education complete, DC therapy    Frequency     Barriers to discharge        Co-evaluation               End of Session   Activity Tolerance: Patient tolerated treatment well Patient left:  (in W/C with transport) Nurse Communication: Mobility status         Time:  1751-0258 PT Time Calculation (min) (ACUTE ONLY): 9 min   Charges:   PT Evaluation $Initial PT Evaluation Tier I: 1 Procedure     PT G CodesCatarina Hartshorn, [place] 04/12/2015, 12:34 PM

## 2015-04-13 ENCOUNTER — Encounter (HOSPITAL_COMMUNITY): Payer: Self-pay | Admitting: *Deleted

## 2015-04-13 ENCOUNTER — Inpatient Hospital Stay (HOSPITAL_COMMUNITY): Payer: BLUE CROSS/BLUE SHIELD

## 2015-04-13 DIAGNOSIS — I639 Cerebral infarction, unspecified: Secondary | ICD-10-CM | POA: Insufficient documentation

## 2015-04-13 DIAGNOSIS — H4900 Third [oculomotor] nerve palsy, unspecified eye: Secondary | ICD-10-CM | POA: Insufficient documentation

## 2015-04-13 DIAGNOSIS — I420 Dilated cardiomyopathy: Secondary | ICD-10-CM | POA: Diagnosis not present

## 2015-04-13 DIAGNOSIS — I638 Other cerebral infarction: Secondary | ICD-10-CM

## 2015-04-13 LAB — BASIC METABOLIC PANEL
Anion gap: 8 (ref 5–15)
BUN: 8 mg/dL (ref 6–20)
CO2: 24 mmol/L (ref 22–32)
Calcium: 8.9 mg/dL (ref 8.9–10.3)
Chloride: 107 mmol/L (ref 101–111)
Creatinine, Ser: 0.41 mg/dL — ABNORMAL LOW (ref 0.44–1.00)
GFR calc Af Amer: >60 mL/min (ref >60)
GFR calc non Af Amer: >60 mL/min (ref >60)
Glucose, Bld: 171 mg/dL — ABNORMAL HIGH (ref 65–99)
Potassium: 3.9 mmol/L (ref 3.5–5.1)
Sodium: 139 mmol/L (ref 135–145)

## 2015-04-13 LAB — CBC
HCT: 45.6 % (ref 36.0–46.0)
Hemoglobin: 15.7 g/dL — ABNORMAL HIGH (ref 12.0–15.0)
MCH: 29.5 pg (ref 26.0–34.0)
MCHC: 34.4 g/dL (ref 30.0–36.0)
MCV: 85.6 fL (ref 78.0–100.0)
Platelets: 164 10*3/uL (ref 150–400)
RBC: 5.33 MIL/uL — ABNORMAL HIGH (ref 3.87–5.11)
RDW: 13 % (ref 11.5–15.5)
WBC: 5.1 10*3/uL (ref 4.0–10.5)

## 2015-04-13 LAB — GLUCOSE, CAPILLARY
Glucose-Capillary: 166 mg/dL — ABNORMAL HIGH (ref 65–99)
Glucose-Capillary: 191 mg/dL — ABNORMAL HIGH (ref 65–99)
Glucose-Capillary: 193 mg/dL — ABNORMAL HIGH (ref 65–99)
Glucose-Capillary: 215 mg/dL — ABNORMAL HIGH (ref 65–99)
Glucose-Capillary: 313 mg/dL — ABNORMAL HIGH (ref 65–99)

## 2015-04-13 LAB — HEMOGLOBIN A1C
Hgb A1c MFr Bld: 13.2 % — ABNORMAL HIGH (ref 4.8–5.6)
Mean Plasma Glucose: 332 mg/dL

## 2015-04-13 MED ORDER — ISOSORBIDE MONONITRATE ER 30 MG PO TB24
60.0000 mg | ORAL_TABLET | Freq: Every day | ORAL | Status: DC
  Administered 2015-04-13 – 2015-04-16 (×4): 60 mg via ORAL
  Filled 2015-04-13 (×4): qty 2

## 2015-04-13 MED ORDER — CARVEDILOL 12.5 MG PO TABS
25.0000 mg | ORAL_TABLET | Freq: Two times a day (BID) | ORAL | Status: DC
  Administered 2015-04-13 – 2015-04-16 (×7): 25 mg via ORAL
  Filled 2015-04-13 (×7): qty 2

## 2015-04-13 MED ORDER — PERFLUTREN LIPID MICROSPHERE
1.0000 mL | INTRAVENOUS | Status: AC | PRN
  Administered 2015-04-13: 4 mL via INTRAVENOUS
  Filled 2015-04-13: qty 10

## 2015-04-13 MED ORDER — LISINOPRIL 20 MG PO TABS
40.0000 mg | ORAL_TABLET | Freq: Every day | ORAL | Status: DC
  Administered 2015-04-14 – 2015-04-16 (×3): 40 mg via ORAL
  Filled 2015-04-13 (×3): qty 2

## 2015-04-13 NOTE — Progress Notes (Signed)
  Echocardiogram 2D Echocardiogram has been performed.  Bobbye Charleston 04/13/2015, 12:40 PM

## 2015-04-13 NOTE — Consult Note (Addendum)
Admit date: 04/11/2015 Referring Physician  Dr. Wynelle Cleveland Primary Physician  Dr. Scarlette Calico Primary Cardiologist  Dr. 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  She underwent lumpectomy followed by chemo with CMF 02/20/2013 and last dose 06/05/2013 and then XRT 05/2013-07/2013.  She presented to the ER on 04/11/2015 with complaints of double vision and worsening gait ( she had some balance issues that persisted from prior CVA).  Head CT showed no acute findings with old infarct and chronic microvascular disease.  She was found to have a BP of 227/14mmHg and reported that she had stopped all her antihypertensive meds 6 months ago without consulting her MD.  She denied any chest pain, SOB, DOE, LE edema.  Her ACE I was continued and BB therapy was added for BP control.  She was started back on her Imdur/norvasc.  2D echo showed reduced LVF with EF 25-35% with diffuse hypokinesis of the anterior wall and akinesis of the anterior wall.  Cardiology is now asked to evaluate for new worsening LVF.     PMH:   Past Medical History  Diagnosis Date  . HTN (hypertension)   . Diabetes mellitus without complication   . Myocardial infarction 09/07/12  . Breast cancer 12/03/12    a. 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 S/p chemo with CMF and radiation.  Marland Kitchen History of radiation therapy 06/23/2013-08/08/2013    62.4 gray to left breast  . Hypercholesteremia   . Coronary artery disease     a. STEMI 08/2012: s/p DES to LAD, PTCA to downstream LAD. b. Relook cath same hospitalization: stable post-PCI anatomy with Stable  Diag lesions (unlikely cause of resting angina, not good PCI targets), stable RCA lesion (non flow limiting).  . Stroke, embolic     a. 82/9937 post cath.  . Peripheral vision loss     left  . Balance problems     due to stroke  . History of cancer chemotherapy     last in August 2014  . GERD (gastroesophageal reflux disease)     food related  . Arthritis     back  . Renal artery stenosis     a. 20% L RAS in 08/2012 by cath. b. Duplex 07/2012: >60% L RAS.  . LV dysfunction     a. EF 45-50% at time of STEMI 08/2012. b. Echo 04/2013: EF 45-50%, mild focal basal hypertrophy of septum, grade 1 d/d.  Marland Kitchen Vasovagal syncope     a. 04/2013 - echo stable compared to prior EF 45-50%, negative carotid duplex.     PSH:   Past Surgical History  Procedure Laterality Date  . Coronary angioplasty with stent placement    . Breast lumpectomy with needle localization and axillary sentinel lymph node bx Left 01/08/2013    Procedure: LEFT BREAST WIRE GUIDED  LUMPECTOMY AND LEFT AXILLARY SENTINEL  NODE BX;  Surgeon: Rolm Bookbinder, MD;  Location: [facility];  Service: General;  Laterality: Left;  . Portacath placement Right 02/17/2013    Procedure: INSERTION PORT-A-CATH;  Surgeon: Rolm Bookbinder, MD;  Location: [facility]  OR;  Service: General;  Laterality: Right;  . Back surgery      x3, lower back  . Cataract extraction Bilateral   . Port-a-cath removal N/A 10/14/2013    Procedure: REMOVAL PORT-A-CATH;  Surgeon: Rolm Bookbinder, MD;  Location: [facility] ORS;  Service: General;  Laterality: N/A;  . Left heart catheterization with coronary angiogram N/A 09/07/2012    Procedure: LEFT HEART CATHETERIZATION WITH CORONARY ANGIOGRAM;  Surgeon: Troy Sine, MD;  Location: [facility] CATH LAB;  Service: Cardiovascular;  Laterality: N/A;  . Percutaneous coronary stent intervention (pci-s) N/A 09/07/2012    Procedure: PERCUTANEOUS CORONARY STENT INTERVENTION (PCI-S);  Surgeon: Troy Sine, MD;  Location: [facility] CATH LAB;  Service:  Cardiovascular;  Laterality: N/A;  . Left heart catheterization with coronary angiogram  09/09/2012    Procedure: LEFT HEART CATHETERIZATION WITH CORONARY ANGIOGRAM;  Surgeon: Leonie Man, MD;  Location: [facility] CATH LAB;  Service: Cardiovascular;;    Allergies:  Review of patient's allergies indicates no known allergies. Prior to Admit Meds:   Prescriptions prior to admission  Medication Sig Dispense Refill Last Dose  . aspirin EC 81 MG EC tablet Take 1 tablet (81 mg total) by mouth daily.   Past Month at Unknown time  . glucose blood (ONETOUCH VERIO) test strip Use TID 100 each 12 04/10/2015 at Unknown time  . ibuprofen (ADVIL,MOTRIN) 200 MG tablet Take 200 mg by mouth every 6 (six) hours as needed for mild pain or moderate pain. For pain   unknown  . nitroGLYCERIN (NITROSTAT) 0.4 MG SL tablet Place 0.4 mg under the tongue every 5 (five) minutes as needed for chest pain.   unknown  . amLODipine (NORVASC) 5 MG tablet Take 1 tablet (5 mg total) by mouth 2 (two) times daily. (Patient not taking: Reported on 04/11/2015) 180 tablet 3 Not Taking at Unknown time  . atorvastatin (LIPITOR) 80 MG tablet Take 1 tablet (80 mg total) by mouth daily with supper. (Patient not taking: Reported on 04/11/2015) 90 tablet 3 Not Taking at Unknown time  . hydrochlorothiazide (HYDRODIURIL) 25 MG tablet Take 1 tablet (25 mg total) by mouth daily. (Patient not taking: Reported on 04/11/2015) 30 tablet 11 Not Taking at Unknown time  . insulin aspart (NOVOLOG) 100 UNIT/ML FlexPen Inject 10 Units into the skin 3 (three) times daily with meals. (Patient not taking: Reported on 04/11/2015) 15 mL 0 Not Taking at Unknown time  . Insulin Glargine (LANTUS SOLOSTAR) 100 UNIT/ML Solostar Pen Inject 31 units [place] at bedtime. (Patient not taking: Reported on 04/11/2015) 1 pen 0 Not Taking at Unknown time  . isosorbide mononitrate (IMDUR) 60 MG 24 hr tablet Take 1 tablet (60 mg total) by mouth daily. (Patient not taking: Reported on 04/11/2015)  90 tablet 3 Not Taking at Unknown time  . lisinopril (PRINIVIL,ZESTRIL) 40 MG tablet Take 1 tablet (40 mg total) by mouth daily with breakfast. (Patient not taking: Reported on 04/11/2015) 90 tablet 3 Not Taking at Unknown time  . metFORMIN (GLUCOPHAGE XR) 750 MG 24 hr tablet Take 1 tablet (750 mg total) by mouth daily with breakfast. (Patient not taking: Reported on 04/11/2015) 30 tablet 0 Not Taking at Unknown time  . metoprolol (LOPRESSOR) 100 MG tablet Take 2 tablets (200 mg total) by mouth 2 (two) times daily. (Patient not taking: Reported on 04/11/2015) 120 tablet 5 Not Taking at Unknown time   Fam HX:    Family History  Problem Relation Age of Onset  . Cancer Neg Hx   .  Diabetes Neg Hx   . Hyperlipidemia Neg Hx   . Hypertension Neg Hx   . Kidney disease Neg Hx   . Stroke Neg Hx    Social HX:    History   Social History  . Marital Status: Married    Spouse Name: [name]  . Number of Children: 2  . Years of Education: college   Occupational History  .     Social History Main Topics  . Smoking status: Never Smoker   . Smokeless tobacco: Never Used  . Alcohol Use: No  . Drug Use: No  . Sexual Activity: Yes   Other Topics Concern  . Not on file   Social History Narrative   Patient is out of work on medical leave and lives at home with husband.    Patient has 2 children and some college education.               ROS:  All 11 ROS were addressed and are negative except what is stated in the HPI  Physical Exam: Blood pressure 166/80, pulse 68, temperature 98.8 F (37.1 C), temperature source Oral, resp. rate 17, SpO2 97 %.    General: Well developed, well nourished, in no acute distress Head: Eyes PERRLA, No xanthomas.   Normal cephalic and atramatic  Lungs:   Clear bilaterally to auscultation and percussion. Heart:   HRRR S1 S2 Pulses are 2+ & equal.            No carotid bruit. No JVD.  No abdominal bruits. No femoral bruits. Abdomen: Bowel sounds are positive,  abdomen soft and non-tender without masses Extremities:   No clubbing, cyanosis or edema.  DP +1 Neuro: Alert and oriented X 3. Psych:  Good affect, responds appropriately    Labs:   Lab Results  Component Value Date   WBC 5.1 04/13/2015   HGB 15.7* 04/13/2015   HCT 45.6 04/13/2015   MCV 85.6 04/13/2015   PLT 164 04/13/2015    Recent Labs Lab 04/11/15 1610  04/13/15 0554  NA 135  < > 139  K 4.0  < > 3.9  CL 103  < > 107  CO2 21*  < > 24  BUN 14  < > 8  CREATININE 0.50  < > 0.41*  CALCIUM 9.4  < > 8.9  PROT 7.0  --   --   BILITOT 0.7  --   --   ALKPHOS 98  --   --   ALT 15  --   --   AST 15  --   --   GLUCOSE 390*  < > 171*  < > = values in this interval not displayed. No results found for: PTT Lab Results  Component Value Date   INR 1.66* 04/11/2015   INR 0.98 02/10/2013   INR 1.01 01/06/2013   Lab Results  Component Value Date   CKTOTAL 47 05/17/2013   CKMB 3.0 05/17/2013   TROPONINI <0.30 05/18/2013     Lab Results  Component Value Date   CHOL 208* 04/12/2015   CHOL 119 06/26/2013   CHOL 216* 09/08/2012   Lab Results  Component Value Date   HDL 50 04/12/2015   HDL 45.00 06/26/2013   HDL 60 09/08/2012   Lab Results  Component Value Date   LDLCALC 139* 04/12/2015   LDLCALC 45 06/26/2013   LDLCALC 142* 09/08/2012   Lab Results  Component Value Date   TRIG 95 04/12/2015   TRIG 144.0 06/26/2013  TRIG 68 09/08/2012   Lab Results  Component Value Date   CHOLHDL 4.2 04/12/2015   CHOLHDL 3 06/26/2013   CHOLHDL 3.6 09/08/2012   No results found for: LDLDIRECT    Radiology:  Ct Head Wo Contrast  04/11/2015   CLINICAL DATA:  Vision changes, hyperglycemia  EXAM: CT HEAD WITHOUT CONTRAST  TECHNIQUE: Contiguous axial images were obtained from the base of the skull through the vertex without intravenous contrast.  COMPARISON:  MR brain 06/27/2013  FINDINGS: There is no evidence of mass effect, midline shift, or extra-axial fluid collections. There  is no evidence of a space-occupying lesion or intracranial hemorrhage. There is no evidence of a cortical-based area of acute infarction. There is an old right posterior parietal lobe infarct with encephalomalacia. There is an old small right cerebellar infarct. There is periventricular white matter low attenuation likely secondary to microangiopathy.  The ventricles and sulci are appropriate for the patient's age. The basal cisterns are patent.  Visualized portions of the orbits are unremarkable. The visualized portions of the paranasal sinuses and mastoid air cells are unremarkable. Cerebrovascular atherosclerotic calcifications are noted.  The osseous structures are unremarkable.  IMPRESSION: 1. No acute intracranial pathology. 2. Chronic microvascular disease .   Electronically Signed   By: Kathreen Devoid   On: 04/11/2015 16:30   Mr Brain Wo Contrast  04/11/2015   CLINICAL DATA:  Diplopia, worsening balance beginning yesterday at 3 p.m. History of stroke, diabetes, hypertension, breast cancer.  EXAM: MRI HEAD WITHOUT CONTRAST  MRA HEAD WITHOUT CONTRAST  TECHNIQUE: Multiplanar, multiecho pulse sequences of the brain and surrounding structures were obtained without intravenous contrast. Angiographic images of the head were obtained using MRA technique without contrast.  COMPARISON:  CT head April 11, 2015 at 1626 hours and MRI of the brain June 27, 2013  FINDINGS: MRI HEAD FINDINGS  No reduced diffusion to suggest acute ischemia. New subcentimeter focus of susceptibility artifact RIGHT pons, LEFT mesial temporal lobe, with additional scattered small foci of susceptibility artifact which were present previously. Old RIGHT occipital lobe hemorrhagic infarct. Ventricles and sulci are normal for patient's age. Patchy supratentorial, pontine and RIGHT cerebellar white matter T2 hyperintensities are advanced from prior imaging. No midline shift or mass effect. Remote small RIGHT inferior cerebellar infarcts were  present previously. Small rib remote RIGHT brachium pontis infarct. Remote RIGHT thalamus lacunar infarcts.  No abnormal extra-axial fluid collections. Status post bilateral ocular lens implants. Paranasal sinuses and mastoid air cells are well aerated. 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 No abnormal sellar expansion. No cerebellar tonsillar ectopia. No suspicious calvarial bone marrow signal. Scattered scalp small sebaceous cysts. New 8 x 13 mm low signal mass LEFT parotid gland with reduced diffusion.  MRA HEAD FINDINGS  Anterior circulation: Normal flow related enhancement of the included cervical, petrous, cavernous and supra clinoid internal carotid arteries. Moderate symmetric stenosis of the proximal supraclinoid internal carotid arteries, likely due to atherosclerosis. Patent anterior communicating artery. Moderate stenosis LEFT A1 segment origin. Patent anterior communicating artery with normal flow related enhancement of the anterior cerebral arteries. Moderate luminal irregularity/stenosis LEFT M1 segment. Bilateral middle cerebral arteries are patent including mid to distal segments.  No large vessel occlusion, high-grade stenosis, aneurysm.  Posterior circulation: RIGHT vertebral artery is dominant. Basilar artery is patent, with normal flow related enhancement of the main branch vessels. Moderate stenosis distal basilar artery at the level of the superior cerebellar artery origins. Thready, irregular LEFT V4 segment flow related enhancement, poorly visualized distally. Moderate  stenosis mid to distal RIGHT posterior cerebral artery. Bilateral posterior cerebral arteries are patent.  No large vessel occlusion, high-grade stenosis, aneurysm.  IMPRESSION: MRI HEAD: No acute intracranial process, specifically no acute ischemia.  Old RIGHT hemorrhagic posterior cerebral artery territory infarct. Additional areas of susceptibility artifact are progressed from prior examination suggesting  sequelae of chronic hypertension, unlikely to represent hemorrhagic metastasis. Moderate advanced white matter changes most consistent chronic small vessel ischemic disease. Old small infarcts as described above.  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  MRA HEAD: No large vessel occlusion. Thready, irregular LEFT vertebral artery with poor visualization distal V4 segment which can be seen with slow flow or, high-grade stenosis.  Moderate luminal irregularity/ stenosis of the anterior and posterior circulation consistent with atherosclerosis.   Electronically Signed   By: Elon Alas M.D.   On: 04/11/2015 23:06   Mr Jodene Nam Head/brain Wo Cm  04/11/2015   CLINICAL DATA:  Diplopia, worsening balance beginning yesterday at 3 p.m. History of stroke, diabetes, hypertension, breast cancer.  EXAM: MRI HEAD WITHOUT CONTRAST  MRA HEAD WITHOUT CONTRAST  TECHNIQUE: Multiplanar, multiecho pulse sequences of the brain and surrounding structures were obtained without intravenous contrast. Angiographic images of the head were obtained using MRA technique without contrast.  COMPARISON:  CT head April 11, 2015 at 1626 hours and MRI of the brain June 27, 2013  FINDINGS: MRI HEAD FINDINGS  No reduced diffusion to suggest acute ischemia. New subcentimeter focus of susceptibility artifact RIGHT pons, LEFT mesial temporal lobe, with additional scattered small foci of susceptibility artifact which were present previously. Old RIGHT occipital lobe hemorrhagic infarct. Ventricles and sulci are normal for patient's age. Patchy supratentorial, pontine and RIGHT cerebellar white matter T2 hyperintensities are advanced from prior imaging. No midline shift or mass effect. Remote small RIGHT inferior cerebellar infarcts were present previously. Small rib remote RIGHT brachium pontis infarct. Remote  RIGHT thalamus lacunar infarcts.  No abnormal extra-axial fluid collections. Status post bilateral ocular lens implants. Paranasal sinuses and mastoid air cells are well aerated. 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 No abnormal sellar expansion. No cerebellar tonsillar ectopia. No suspicious calvarial bone marrow signal. Scattered scalp small sebaceous cysts. New 8 x 13 mm low signal mass LEFT parotid gland with reduced diffusion.  MRA HEAD FINDINGS  Anterior circulation: Normal flow related enhancement of the included cervical, petrous, cavernous and supra clinoid internal carotid arteries. Moderate symmetric stenosis of the proximal supraclinoid internal carotid arteries, likely due to atherosclerosis. Patent anterior communicating artery. Moderate stenosis LEFT A1 segment origin. Patent anterior communicating artery with normal flow related enhancement of the anterior cerebral arteries. Moderate luminal irregularity/stenosis LEFT M1 segment. Bilateral middle cerebral arteries are patent including mid to distal segments.  No large vessel occlusion, high-grade stenosis, aneurysm.  Posterior circulation: RIGHT vertebral artery is dominant. Basilar artery is patent, with normal flow related enhancement of the main branch vessels. Moderate stenosis distal basilar artery at the level of the superior cerebellar artery origins. Thready, irregular LEFT V4 segment flow related enhancement, poorly visualized distally. Moderate stenosis mid to distal RIGHT posterior cerebral artery. Bilateral posterior cerebral arteries are patent.  No large vessel occlusion, high-grade stenosis, aneurysm.  IMPRESSION: MRI HEAD: No acute intracranial process, specifically no acute ischemia.  Old RIGHT hemorrhagic posterior cerebral artery territory infarct. Additional areas of susceptibility artifact are progressed from prior examination suggesting sequelae of chronic hypertension, unlikely  to represent hemorrhagic  metastasis. Moderate advanced white matter changes most consistent chronic small vessel ischemic disease. Old small infarcts as described above.  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  MRA HEAD: No large vessel occlusion. Thready, irregular LEFT vertebral artery with poor visualization distal V4 segment which can be seen with slow flow or, high-grade stenosis.  Moderate luminal irregularity/ stenosis of the anterior and posterior circulation consistent with atherosclerosis.   Electronically Signed   By: Elon Alas M.D.   On: 04/11/2015 23:06    EKG:  NSR with anterolateral and inferior infarcts unchanged from EKG 01/2014  ASSESSMENT/PLAN:  1.  Severe LV dysfunction.  Etiology could be multifactorial from progression of CAD, chemotherapy induced and/or hypertensive CM.  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  She subsequently had chemo with CMF from 01/2013 to 05/2013.  Her last echo 04/2013 showed EF 45-50% but no followup echo done after finishing chemo.  She also had XRT 05/2013 to 07/2013.  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  Recommend Lexiscan MRI to rule out ischemia and assess for viable myocardium vs. Scar.  Change metoprolol to Coreg 25mg  BID given LV dysfunction and will have better BP control as well.  Continue ACE I. 2.  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  MRI showed no new CVA but new large left parotid mass.  MRA with no significant stenosis.  No evidence of LV thrombus on 2D echo with definity contrast agent.   3.  HTN presenting with malignant HTN after stopping BP meds 6 months ago.  Continue BB/ACE I/amlodipine.  Titrated amlodipine as needed for better BP control.   May need to add hydralazine. 4.  ASCAD s/p remote anterior STEMI in 2013 with PCI of LAD and non flow limiting lesions in the RCA and diagonal.  She has not had any chest pain or SOB.  Continue ASA/long acting nitrates/statin.   5.  Type II DM - per IM 6.  Remote CVA/TIA post cath without residual deficit  7.  Triple negative breast CA s/p lumpectomy 2014 with chemo (CMF) and XRT   I have spent a total of 60 minutes evaluating and examining patient and reviewed extensive past medical records as well as echo.  >50% of this time was spent in direct patient care.   Sueanne Margarita, MD  04/13/2015  2:44 PM

## 2015-04-13 NOTE — Progress Notes (Signed)
STROKE TEAM PROGRESS NOTE   HISTORY Marisa Forbes is an 64 y.o. 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 She initially presented to [facility] with complains of double vision and worsening balance that started yesterday 04/10/2015 around 3 pm ([facility]). Patient indicated that she gets off balance easily but she never had double vision before and her balance has ben worse since yesterday. She realized that her vision was double while watching TV but will go away after closing one eye. Denies associated HA, vertigo, difficulty swallowing, focal weakness or numbness,slurred speech, or language impairment. Upon arrival to [facility] was noticed to have uncontrolled blood pressure 227/119, and also with uncontrolled blood sugar (patient admits to not taking her medications for several months). CT brain was reviewed and showed no acute abnormality. Patient was not administered TPA secondary to out of the window. She was admitted for further evaluation and treatment.   SUBJECTIVE (INTERVAL HISTORY) No family is at the bedside.  Overall she feels her condition is stable. She is stable and has no new complaints today. Carotid Dopplers are unremarkable and 2-D echo has been done. But results are pending  OBJECTIVE Temp:  [98 F (36.7 C)-98.8 F (37.1 C)] 98.8 F (37.1 C) (06/14 1416) Pulse Rate:  [59-70] 68 (06/14 1416) Cardiac Rhythm:  [-] Heart block (06/14 0753) Resp:  [16-20] 17 (06/14 1416) BP: (145-167)/(77-97) 166/80 mmHg (06/14 1416) SpO2:  [95 %-100 %] 97 % (06/14 1416)   Recent Labs Lab 04/12/15 1752 04/12/15 2024 04/13/15 0019  04/13/15 0353 04/13/15 1306  GLUCAP 218* 211* 191* 166* 193*    Recent Labs Lab 04/11/15 1610 04/11/15 1617 04/11/15 1930 04/12/15 0810 04/13/15 0554  NA 135 138  --  140 139  K 4.0 3.7  --  3.3* 3.9  CL 103 101  --  109 107  CO2 21*  --   --  24 24  GLUCOSE 390* 397*  --  160* 171*  BUN 14 14  --  13 8  CREATININE 0.50 0.30* 0.43* 0.45 0.41*  CALCIUM 9.4  --   --  9.0 8.9    Recent Labs Lab 04/11/15 1610  AST 15  ALT 15  ALKPHOS 98  BILITOT 0.7  PROT 7.0  ALBUMIN 4.3    Recent Labs Lab 04/11/15 1610 04/11/15 1617 04/11/15 1930 04/12/15 0810 04/13/15 0554  WBC 4.0  --  4.3 4.7 5.1  NEUTROABS 2.6  --   --   --   --   HGB 17.3* 17.7* 17.3* 15.7* 15.7*  HCT 50.1* 52.0* 48.2* 46.3* 45.6  MCV 85.5  --  83.0 85.4 85.6  PLT 186  --  167 182 164   No results for input(s): CKTOTAL, CKMB, CKMBINDEX, TROPONINI in the last 168 hours.  Recent Labs  04/11/15 1620  LABPROT 19.7*  INR 1.66*    Recent Labs  04/11/15 1807  COLORURINE YELLOW  LABSPEC 1.020  PHURINE 6.5  GLUCOSEU >1000*  HGBUR NEGATIVE  BILIRUBINUR NEGATIVE  KETONESUR NEGATIVE  PROTEINUR NEGATIVE  UROBILINOGEN 0.2  NITRITE NEGATIVE  LEUKOCYTESUR SMALL*       Component Value Date/Time   CHOL 208* 04/12/2015 0319   TRIG 95  04/12/2015 0319   HDL 50 04/12/2015 0319   CHOLHDL 4.2 04/12/2015 0319   VLDL 19 04/12/2015 0319   LDLCALC 139* 04/12/2015 0319   Lab Results  Component Value Date   HGBA1C 13.2* 04/12/2015      Component Value Date/Time   LABOPIA NONE DETECTED 04/11/2015 1807   COCAINSCRNUR NONE DETECTED 04/11/2015 1807   LABBENZ NONE DETECTED 04/11/2015 1807   AMPHETMU NONE DETECTED 04/11/2015 1807   THCU NONE DETECTED 04/11/2015 1807   LABBARB NONE DETECTED 04/11/2015 1807     Recent Labs Lab 04/11/15 1610  ETH <5    Ct Head Wo Contrast 04/11/2015    1. No acute intracranial pathology. 2. Chronic microvascular disease .     MRI HEAD 04/11/2015  No acute  intracranial process, specifically no acute ischemia.  Old RIGHT hemorrhagic posterior cerebral artery territory infarct. Additional areas of susceptibility artifact are progressed from prior examination suggesting sequelae of chronic hypertension, unlikely to represent hemorrhagic metastasis. Moderate advanced white matter changes most consistent chronic small vessel ischemic disease. Old small infarcts as described above.  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    MRA HEAD 04/11/2015  No large vessel occlusion. Thready, irregular LEFT vertebral artery with poor visualization distal V4 segment which can be seen with slow flow or, high-grade stenosis.  Moderate luminal irregularity/ stenosis of the anterior and posterior circulation consistent with atherosclerosis.     Carotid Doppler  1-39% internal carotid artery stenosis bilaterally. Vertebral arteries are patent with antegrade flow   PHYSICAL EXAM Pleasant middle-age Caucasian lady currently not in distress. . Afebrile. Head is nontraumatic. Neck is supple without bruit.    Cardiac exam no murmur or gallop. Lungs are clear to auscultation. Distal pulses are well felt. Neurological Exam :  Awake alert oriented 3 with normal speech and language function. Memory and attention are intact. Extraocular movements are full range without nystagmus. Fundi were not visualized. Vision acuity seems adequate. Dense left homonymous hemianopsia. Mild subjective diplopia upon accommodation only. No skew eye deviation. Face is symmetric without weakness. Tongue is midline. Motor system exam revealed no upper or lower extremity drift. Symmetric and equal strength in all 4 extremities. Intermittent head tremor. Deep tendon reflexes are 2+ symmetric. Plantars are downgoing. Gait was not tested. ASSESSMENT/PLAN Ms. Marisa Forbes  is a 64 y.o. 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 She did not receive IV t-PA due to delay in arrival.   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    Resultant  Vertical diplopia  MRI  No new stroke, new large L parotid mass 8x36mm  MRA  No large bessel stenosis  Carotid Doppler  No significant stenosis   2D Echo  pending   LDL 139  HgbA1c 13.2  Heparin 5000 units sq tid for VTE prophylaxis Diet heart healthy/carb modified Room service appropriate?: Yes; Fluid consistency:: Thin  aspirin 81 mg orally every day prior to admission, now on aspirin 325 mg orally every day  Therapy recommendations:  No PT needs  Disposition:  Anticipate return home  Essential Hypertension  Hyperlipidemia  Home meds:  No statin  LDL 139, goal < 70  Added statin, lipitor 20  Continue statin at discharge  Diabetes type II  HgbA1c 13.2, goal < 7.0  Other Stroke Risk Factors  Hx stroke/TIA - incidental post cath embolic [id number]/4081 without residual deficit noted during hospitalization but with L HH found at follow up w/ TAT, ? Related  Coronary artery disease - MI, STEMI w/ PCI, DES 2013  Essential Head Tremor, chronic d/t cervical dystonia  Has received botox in the past  Seen by Dr. Carles Collet in the past. Pt prefers to follow up with 1 neurologist - can be seen at [facility], does not have to be [facility], as Janann Colonel is no longer with the office, any MD will do  Hx Vertigo and Gait Ataxia  Fulls resolved at time of OV in 2014  Felt to be d/t BPPV vs vascular/ischemi vs neuropathy  Seen by Dr. 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 I have made any additions or clarifications directly to the above note. . She presented with transient vertical diplopia which seems to be improving and worsening of mild gait difficulty. She possibly had a small brainstem TIA from small vessel disease versus diabetic partial third nerve palsy. She needs ongoing stroke evaluation and aggressive risk factor modification. Continue aspirin for stroke prevention. Patient was counseled to be compliant with her medications. Stroke team will sign off. Kindly follow-up as an outpatient in 2 months or call earlier if necessary.  Antony Contras, MD Medical Director [facility] Pager: [phone] 04/13/2015 3:53 PM    To contact Stroke Continuity provider, please refer to [email or web address] After hours, contact General Neurology

## 2015-04-13 NOTE — Progress Notes (Signed)
Tolerated definity injection well; no c/o pain or discomfort 

## 2015-04-13 NOTE — Progress Notes (Signed)
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  ECHO reveals an EF of 20-25% and diffuse hypokinesis Carotid Doppler-  no significant stenosis.  Risk factors modification.  MRI; negative for acute stroke, showed old strokes. \ Continue with aspirin 325 mg.  LDL: 139 - on Lipitor.   Systolic heart failure/cardiomyopathy -EF found to be 20-25%-cardiology consult requested -Explained to patient this is likely due to not taking her medications appropriately -Resume lisinopril-continue metoprolol  HTN, malignant;  Presents with SBP 227/119-drop suddenly to become too low and a number of medications were held-BP elevated again today Adjusting medications to maintain blood pressure control   Diabetes mellitus, uncontrolled. Was not taking her insulin.   HbA1c 13.2  Continue with lantus.  Blood sugar better controlled.   Elevated Hb -Likely hemoconcentrated -Improved with hydration down to 15.7 today -Continue to hydrate and follow  New large L parotid mass 8x25mm - needs follow up to be arranged by her PCP.   Coronary artery disease; continue with aspirin, metoprolol.   Code Status: Full Code.  Family Communication: care discussed with patient.  Disposition Plan:   Consultants: Neurology  Procedures:  ECHO   Carotid doppler: Findings suggest 1-39% internal carotid artery stenosis bilaterally. Vertebral arteries are patent with antegrade flow.  Antibiotics:  none  HPI/Subjective: Marisa Forbes has no complaints today. Discussed importance of taking medications appropriately.  Objective: Filed Vitals:   04/13/15 1416  BP: 166/80  Pulse: 68  Temp: 98.8 F (37.1 C)  Resp: 17    Intake/Output Summary (Last 24 hours) at 04/13/15 1617 Last data filed at 04/12/15 1810  Gross per 24 hour   Intake    240 ml  Output      0 ml  Net    240 ml   There were no vitals filed for this visit.  Exam:   General:  Alert in no distress.   Cardiovascular: S 1, S 2 RRR  Respiratory: CTA  Abdomen: BS present, soft, nt  Musculoskeletal: no edema  Neuro; double vision better.    Data Reviewed: Basic Metabolic Panel:  Recent Labs Lab 04/11/15 1610 04/11/15 1617 04/11/15 1930 04/12/15 0810 04/13/15 0554  NA 135 138  --  140 139  K 4.0 3.7  --  3.3* 3.9  CL 103 101  --  109 107  CO2 21*  --   --  24 24  GLUCOSE 390* 397*  --  160* 171*  BUN 14 14  --  13 8  CREATININE 0.50 0.30* 0.43* 0.45 0.41*  CALCIUM 9.4  --   --  9.0 8.9   Liver Function Tests:  Recent Labs Lab 04/11/15 1610  AST 15  ALT 15  ALKPHOS 98  BILITOT 0.7  PROT 7.0  ALBUMIN 4.3   No results for input(s): LIPASE, AMYLASE in the last 168 hours. No results for input(s): AMMONIA in the last 168 hours. CBC:  Recent Labs Lab 04/11/15 1610 04/11/15 1617 04/11/15 1930 04/12/15 0810 04/13/15 0554  WBC 4.0  --  4.3 4.7 5.1  NEUTROABS 2.6  --   --   --   --   HGB 17.3* 17.7* 17.3* 15.7* 15.7*  HCT 50.1* 52.0* 48.2* 46.3* 45.6  MCV 85.5  --  83.0 85.4 85.6  PLT 186  --  167 182 164   Cardiac Enzymes: No results  for input(s): CKTOTAL, CKMB, CKMBINDEX, TROPONINI in the last 168 hours. BNP (last 3 results) No results for input(s): BNP in the last 8760 hours.  ProBNP (last 3 results) No results for input(s): PROBNP in the last 8760 hours.  CBG:  Recent Labs Lab 04/12/15 1752 04/12/15 2024 04/13/15 0019 04/13/15 0353 04/13/15 1306  GLUCAP 218* 211* 191* 166* 193*    Recent Results (from the past 240 hour(s))  Urine culture     Status: None (Preliminary result)   Collection Time: 04/12/15 11:15 AM  Result Value Ref Range Status   Specimen Description URINE, RANDOM  Final   Special Requests NONE  Final   Colony Count   Final    >=100,000 COLONIES/ML Performed at [facility]    Culture   Final    ESCHERICHIA COLI Performed at [facility]    Report Status PENDING  Incomplete     Studies: Ct Head Wo Contrast  04/11/2015   CLINICAL DATA:  Vision changes, hyperglycemia  EXAM: CT HEAD WITHOUT CONTRAST  TECHNIQUE: Contiguous axial images were obtained from the base of the skull through the vertex without intravenous contrast.  COMPARISON:  MR brain 06/27/2013  FINDINGS: There is no evidence of mass effect, midline shift, or extra-axial fluid collections. There is no evidence of a space-occupying lesion or intracranial hemorrhage. There is no evidence of a cortical-based area of acute infarction. There is an old right posterior parietal lobe infarct with encephalomalacia. There is an old small right cerebellar infarct. There is periventricular white matter low attenuation likely secondary to microangiopathy.  The ventricles and sulci are appropriate for the patient's age. The basal cisterns are patent.  Visualized portions of the orbits are unremarkable. The visualized portions of the paranasal sinuses and mastoid air cells are unremarkable. Cerebrovascular atherosclerotic calcifications are noted.  The osseous structures are unremarkable.  IMPRESSION: 1. No acute intracranial pathology. 2. Chronic microvascular disease .   Electronically Signed   By: Kathreen Devoid   On: 04/11/2015 16:30   Mr Brain Wo Contrast  04/11/2015   CLINICAL DATA:  Diplopia, worsening balance beginning yesterday at 3 p.m. History of stroke, diabetes, hypertension, breast cancer.  EXAM: MRI HEAD WITHOUT CONTRAST  MRA HEAD WITHOUT CONTRAST  TECHNIQUE: Multiplanar, multiecho pulse sequences of the brain and surrounding structures were obtained without intravenous contrast. Angiographic images of the head were obtained using MRA technique without contrast.  COMPARISON:  CT head April 11, 2015 at 1626 hours and MRI of the brain June 27, 2013  FINDINGS: MRI HEAD FINDINGS  No reduced diffusion  to suggest acute ischemia. New subcentimeter focus of susceptibility artifact RIGHT pons, LEFT mesial temporal lobe, with additional scattered small foci of susceptibility artifact which were present previously. Old RIGHT occipital lobe hemorrhagic infarct. Ventricles and sulci are normal for patient's age. Patchy supratentorial, pontine and RIGHT cerebellar white matter T2 hyperintensities are advanced from prior imaging. No midline shift or mass effect. Remote small RIGHT inferior cerebellar infarcts were present previously. Small rib remote RIGHT brachium pontis infarct. Remote RIGHT thalamus lacunar infarcts.  No abnormal extra-axial fluid collections. Status post bilateral ocular lens implants. Paranasal sinuses and mastoid air cells are well aerated. 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 No abnormal sellar expansion. No cerebellar tonsillar ectopia. No suspicious calvarial bone marrow signal. Scattered scalp small sebaceous cysts. New 8 x 13 mm low signal mass LEFT parotid gland with reduced diffusion.  MRA HEAD FINDINGS  Anterior circulation: Normal flow related enhancement of the  included cervical, petrous, cavernous and supra clinoid internal carotid arteries. Moderate symmetric stenosis of the proximal supraclinoid internal carotid arteries, likely due to atherosclerosis. Patent anterior communicating artery. Moderate stenosis LEFT A1 segment origin. Patent anterior communicating artery with normal flow related enhancement of the anterior cerebral arteries. Moderate luminal irregularity/stenosis LEFT M1 segment. Bilateral middle cerebral arteries are patent including mid to distal segments.  No large vessel occlusion, high-grade stenosis, aneurysm.  Posterior circulation: RIGHT vertebral artery is dominant. Basilar artery is patent, with normal flow related enhancement of the main branch vessels. Moderate stenosis distal basilar artery at the level of the superior cerebellar artery origins.  Thready, irregular LEFT V4 segment flow related enhancement, poorly visualized distally. Moderate stenosis mid to distal RIGHT posterior cerebral artery. Bilateral posterior cerebral arteries are patent.  No large vessel occlusion, high-grade stenosis, aneurysm.  IMPRESSION: MRI HEAD: No acute intracranial process, specifically no acute ischemia.  Old RIGHT hemorrhagic posterior cerebral artery territory infarct. Additional areas of susceptibility artifact are progressed from prior examination suggesting sequelae of chronic hypertension, unlikely to represent hemorrhagic metastasis. Moderate advanced white matter changes most consistent chronic small vessel ischemic disease. Old small infarcts as described above.  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  MRA HEAD: No large vessel occlusion. Thready, irregular LEFT vertebral artery with poor visualization distal V4 segment which can be seen with slow flow or, high-grade stenosis.  Moderate luminal irregularity/ stenosis of the anterior and posterior circulation consistent with atherosclerosis.   Electronically Signed   By: Elon Alas M.D.   On: 04/11/2015 23:06   Mr Jodene Nam Head/brain Wo Cm  04/11/2015   CLINICAL DATA:  Diplopia, worsening balance beginning yesterday at 3 p.m. History of stroke, diabetes, hypertension, breast cancer.  EXAM: MRI HEAD WITHOUT CONTRAST  MRA HEAD WITHOUT CONTRAST  TECHNIQUE: Multiplanar, multiecho pulse sequences of the brain and surrounding structures were obtained without intravenous contrast. Angiographic images of the head were obtained using MRA technique without contrast.  COMPARISON:  CT head April 11, 2015 at 1626 hours and MRI of the brain June 27, 2013  FINDINGS: MRI HEAD FINDINGS  No reduced diffusion to suggest acute ischemia. New subcentimeter focus of susceptibility artifact  RIGHT pons, LEFT mesial temporal lobe, with additional scattered small foci of susceptibility artifact which were present previously. Old RIGHT occipital lobe hemorrhagic infarct. Ventricles and sulci are normal for patient's age. Patchy supratentorial, pontine and RIGHT cerebellar white matter T2 hyperintensities are advanced from prior imaging. No midline shift or mass effect. Remote small RIGHT inferior cerebellar infarcts were present previously. Small rib remote RIGHT brachium pontis infarct. Remote RIGHT thalamus lacunar infarcts.  No abnormal extra-axial fluid collections. Status post bilateral ocular lens implants. Paranasal sinuses and mastoid air cells are well aerated. 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 No abnormal sellar expansion. No cerebellar tonsillar ectopia. No suspicious calvarial bone marrow signal. Scattered scalp small sebaceous cysts. New 8 x 13 mm low signal mass LEFT parotid gland with reduced diffusion.  MRA HEAD FINDINGS  Anterior circulation: Normal flow related enhancement of the included cervical, petrous, cavernous and supra clinoid internal carotid arteries. Moderate symmetric stenosis of the proximal supraclinoid internal carotid arteries, likely due to atherosclerosis. Patent anterior communicating artery. Moderate stenosis LEFT A1 segment origin. Patent anterior communicating artery with normal flow related enhancement of the anterior cerebral arteries. Moderate luminal irregularity/stenosis LEFT M1 segment. Bilateral middle cerebral arteries are  patent including mid to distal segments.  No large vessel occlusion, high-grade stenosis, aneurysm.  Posterior circulation: RIGHT vertebral artery is dominant. Basilar artery is patent, with normal flow related enhancement of the main branch vessels. Moderate stenosis distal basilar artery at the level of the superior cerebellar artery origins. Thready, irregular LEFT V4 segment flow related enhancement, poorly visualized  distally. Moderate stenosis mid to distal RIGHT posterior cerebral artery. Bilateral posterior cerebral arteries are patent.  No large vessel occlusion, high-grade stenosis, aneurysm.  IMPRESSION: MRI HEAD: No acute intracranial process, specifically no acute ischemia.  Old RIGHT hemorrhagic posterior cerebral artery territory infarct. Additional areas of susceptibility artifact are progressed from prior examination suggesting sequelae of chronic hypertension, unlikely to represent hemorrhagic metastasis. Moderate advanced white matter changes most consistent chronic small vessel ischemic disease. Old small infarcts as described above.  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  MRA HEAD: No large vessel occlusion. Thready, irregular LEFT vertebral artery with poor visualization distal V4 segment which can be seen with slow flow or, high-grade stenosis.  Moderate luminal irregularity/ stenosis of the anterior and posterior circulation consistent with atherosclerosis.   Electronically Signed   By: Elon Alas M.D.   On: 04/11/2015 23:06    Scheduled Meds: . amLODipine  5 mg Oral BID  . aspirin  300 mg Rectal Daily   Or  . aspirin  325 mg Oral Daily  . atorvastatin  20 mg Oral q1800  . heparin  5,000 Units Subcutaneous 3 times per day  . insulin aspart  0-9 Units Subcutaneous 6 times per day  . insulin glargine  12 Units Subcutaneous QHS  . isosorbide mononitrate  60 mg Oral Daily  . [START ON 04/14/2015] lisinopril  40 mg Oral Q breakfast  . metoprolol  200 mg Oral BID   Continuous Infusions: . sodium chloride 100 mL/hr at 04/13/15 0933     Time spent: 30 minutes.     [place]  [facility] 04/13/2015, 4:17 PM  LOS: 2 days

## 2015-04-14 ENCOUNTER — Inpatient Hospital Stay (HOSPITAL_COMMUNITY): Payer: BLUE CROSS/BLUE SHIELD

## 2015-04-14 DIAGNOSIS — E1165 Type 2 diabetes mellitus with hyperglycemia: Secondary | ICD-10-CM | POA: Insufficient documentation

## 2015-04-14 DIAGNOSIS — E785 Hyperlipidemia, unspecified: Secondary | ICD-10-CM | POA: Insufficient documentation

## 2015-04-14 DIAGNOSIS — I255 Ischemic cardiomyopathy: Secondary | ICD-10-CM | POA: Insufficient documentation

## 2015-04-14 DIAGNOSIS — E1149 Type 2 diabetes mellitus with other diabetic neurological complication: Secondary | ICD-10-CM

## 2015-04-14 DIAGNOSIS — H532 Diplopia: Secondary | ICD-10-CM

## 2015-04-14 DIAGNOSIS — I429 Cardiomyopathy, unspecified: Secondary | ICD-10-CM

## 2015-04-14 DIAGNOSIS — H4900 Third [oculomotor] nerve palsy, unspecified eye: Secondary | ICD-10-CM

## 2015-04-14 DIAGNOSIS — E1159 Type 2 diabetes mellitus with other circulatory complications: Secondary | ICD-10-CM | POA: Insufficient documentation

## 2015-04-14 DIAGNOSIS — E119 Type 2 diabetes mellitus without complications: Secondary | ICD-10-CM | POA: Insufficient documentation

## 2015-04-14 DIAGNOSIS — G459 Transient cerebral ischemic attack, unspecified: Principal | ICD-10-CM

## 2015-04-14 LAB — GLUCOSE, CAPILLARY
Glucose-Capillary: 106 mg/dL — ABNORMAL HIGH (ref 65–99)
Glucose-Capillary: 128 mg/dL — ABNORMAL HIGH (ref 65–99)
Glucose-Capillary: 182 mg/dL — ABNORMAL HIGH (ref 65–99)
Glucose-Capillary: 197 mg/dL — ABNORMAL HIGH (ref 65–99)
Glucose-Capillary: 222 mg/dL — ABNORMAL HIGH (ref 65–99)
Glucose-Capillary: 361 mg/dL — ABNORMAL HIGH (ref 65–99)

## 2015-04-14 LAB — CBC
HCT: 40.1 % (ref 36.0–46.0)
Hemoglobin: 13.4 g/dL (ref 12.0–15.0)
MCH: 28.5 pg (ref 26.0–34.0)
MCHC: 33.4 g/dL (ref 30.0–36.0)
MCV: 85.3 fL (ref 78.0–100.0)
Platelets: 153 10*3/uL (ref 150–400)
RBC: 4.7 MIL/uL (ref 3.87–5.11)
RDW: 12.9 % (ref 11.5–15.5)
WBC: 3.7 10*3/uL — ABNORMAL LOW (ref 4.0–10.5)

## 2015-04-14 LAB — URINE CULTURE: Colony Count: >=100000

## 2015-04-14 MED ORDER — ATORVASTATIN CALCIUM 40 MG PO TABS
40.0000 mg | ORAL_TABLET | Freq: Every day | ORAL | Status: DC
  Administered 2015-04-14 – 2015-04-16 (×3): 40 mg via ORAL
  Filled 2015-04-14 (×3): qty 1

## 2015-04-14 MED ORDER — GADOBENATE DIMEGLUMINE 529 MG/ML IV SOLN
25.0000 mL | Freq: Once | INTRAVENOUS | Status: AC
  Administered 2015-04-14: 22 mL via INTRAVENOUS

## 2015-04-14 MED ORDER — REGADENOSON 0.4 MG/5ML IV SOLN
INTRAVENOUS | Status: AC
  Filled 2015-04-14: qty 5

## 2015-04-14 NOTE — Progress Notes (Signed)
Patient Name: Marisa Forbes Date of Encounter: 04/14/2015  Principal Problem:   CVA (cerebral infarction) Active Problems:   Essential hypertension   DM (diabetes mellitus)   CAD - STEMI s/p urgent LAD DES 09/07/12, residual RCA and Dx disease, EF 45-50%   Hypertensive urgency   Diplopia   3Rd cranial nerve palsy   Congestive dilated cardiomyopathy   Stroke   SUBJECTIVE  No complain. Denies chest pain, sob or palpitation.   CURRENT MEDS . amLODipine  5 mg Oral BID  . aspirin  300 mg Rectal Daily   Or  . aspirin  325 mg Oral Daily  . atorvastatin  20 mg Oral q1800  . carvedilol  25 mg Oral BID WC  . heparin  5,000 Units Subcutaneous 3 times per day  . insulin aspart  0-9 Units Subcutaneous 6 times per day  . insulin glargine  12 Units Subcutaneous QHS  . isosorbide mononitrate  60 mg Oral Daily  . lisinopril  40 mg Oral Q breakfast  . regadenoson        OBJECTIVE  Filed Vitals:   04/14/15 0100 04/14/15 0531 04/14/15 0812 04/14/15 1009  BP: 127/63 132/64  152/77  Pulse: 62 64  61  Temp: 98.1 F (36.7 C) 98.6 F (37 C)  98 F (36.7 C)  TempSrc: Oral Oral  Oral  Resp: 18 18  16   Height:   5\' 5"  (1.651 m)   Weight:   141 lb 15.6 oz (64.4 kg)   SpO2: 94% 95%  100%   No intake or output data in the 24 hours ending 04/14/15 1330 Filed Weights   04/14/15 0812  Weight: 141 lb 15.6 oz (64.4 kg)    PHYSICAL EXAM  General: Pleasant, NAD. Neuro: Alert and oriented X 3. Moves all extremities spontaneously. Psych: Normal affect. HEENT:  Normal  Neck: Supple without bruits or JVD. Lungs:  Resp regular and unlabored, CTA. Heart: RRR no s3, s4, or murmurs. Abdomen: Soft, non-tender, non-distended, BS + x 4.  Extremities: No clubbing, cyanosis or edema. DP/PT/Radials 2+ and equal bilaterally.  Accessory Clinical Findings  CBC  Recent Labs  04/11/15 1610  04/13/15 0554 04/14/15 0423  WBC 4.0  < > 5.1 3.7*  NEUTROABS 2.6  --   --   --   HGB 17.3*  < >  15.7* 13.4  HCT 50.1*  < > 45.6 40.1  MCV 85.5  < > 85.6 85.3  PLT 186  < > 164 153  < > = values in this interval not displayed. Basic Metabolic Panel  Recent Labs  04/12/15 0810 04/13/15 0554  NA 140 139  K 3.3* 3.9  CL 109 107  CO2 24 24  GLUCOSE 160* 171*  BUN 13 8  CREATININE 0.45 0.41*  CALCIUM 9.0 8.9   Liver Function Tests  Recent Labs  04/11/15 1610  AST 15  ALT 15  ALKPHOS 98  BILITOT 0.7  PROT 7.0  ALBUMIN 4.3   Hemoglobin A1C  Recent Labs  04/12/15 0319  HGBA1C 13.2*   Fasting Lipid Panel  Recent Labs  04/12/15 0319  CHOL 208*  HDL 50  LDLCALC 139*  TRIG 95  CHOLHDL 4.2     Radiology/Studies               Echo 04/13/15 ------------------------------------------------------------------- LV EF: 25% -  30%  ------------------------------------------------------------------- Indications:   CVA 436. 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  ------------------------------------------------------------------- History:  PMH:  Coronary artery disease. PMH:  Myocardial infarction. Risk factors: Hypertension. Diabetes mellitus. Dyslipidemia.  ------------------------------------------------------------------- Study Conclusions  - Left ventricle: The cavity size was normal. There was moderate concentric hypertrophy. Systolic function was severely reduced. The estimated ejection fraction was in the range of 25% to 30%. Diffuse hypokinesis. There is akinesis of the anterior myocardium. Acoustic contrast opacification revealed no evidence ofthrombus.  Impressions:  - No cardiac source of emboli was indentified.  ASSESSMENT AND PLAN  1. Systolic heart failure/cardiomyopathy - echo 04/13/15 showed LV Ef of 20-25%. Etiology chemotherapy induced vs hypertensive CM vs multifactorial CAD. She stopped all BP about 6 months ago. Lexiscan MRI is pending.  -Continue coreg 25mg  BID and ACEI  2. Hypertensive urgency - stopped  antihypertensive medication 6 months ago.  - Continue BB, ACE I, amlodipine, imdur. BP improved. Noted she hansn't received morning dose of imdur and lisinopril. Likely pt went down for imaging.   3. 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 with resultant vertical diplopia -carotid doppler no significant stenosis - neurology following.  4. HL - On lipitor 20mg . Consider increase dose.  -04/12/2015: Cholesterol 208*; HDL 50; LDL Cholesterol 139*; Triglycerides 95; VLDL 19   5. DM  - HgbA1c of 13.2. Management per Im  6. ASCAD -s/p remote anterior STEMI in 2013 with PCI of LAD and non flow limiting lesions in the RCA and diagonal.  - asymptomatic.  Continue ASA/long acting nitrates/statin.    Jarrett Soho PA-C Pager [phone]  Patient seen and examined. Agree with assessment and plan. No chest pain. Breathing better. Now back on therapy after been off meds for 6 months. Not yet a therapeutic steady state blood levels.  Increase atorvastatin to 40 mg, but may need 80 mg. Markedly elevated HbA1C at 13.2.   Troy Sine, MD, [facility] 04/14/2015 2:15 PM

## 2015-04-14 NOTE — Progress Notes (Signed)
[facility] PROGRESS NOTE  Marisa Forbes PJA:[id number] DOB: 18-Jun-1951 DOA: 04/11/2015 PCP: Scarlette Calico, MD  Assessment/Plan:  brainstem TIA from small vessel diseases vs Partial 3rd nerve palsy  -Diplopia continue improving   -ECHO reveals an EF of 20-25% and diffuse hypokinesis; no source of emboli -Carotid Doppler-  no significant stenosis.  -continue agressive Risk factors modification.  -MRI; negative for acute stroke, showed old strokes.  -Continue with aspirin 325 mg for secondary prevention.  -LDL: 139 - continue on Lipitor.   Systolic heart failure/cardiomyopathy -EF found to be 20-25% -cardiology consulted, will follow rec's -Explained to patient this is likely due to medication non-compliance and diet indiscretion -Resume lisinopril -continue metoprolol and encourage to follow low sodium diet   HTN, malignant;  -Presents with SBP 227/119 -Adjusting medications to maintain blood pressure control  -patient educated about medication compliance and low sodium diet  Diabetes mellitus, uncontrolled. -HbA1c 13.2  -Continue with lantus.  -Blood sugar better controlled. -patient not compliant with medications   Elevated Hb -Likely hemoconcentrated -Improved with hydration down to 13.4 today (6/15)  New large L parotid mass 8x79mm - needs follow up to be arranged by her PCP.   Coronary artery disease; continue with aspirin, metoprolol, statins and nitrates.  -patient with hx of medication non-compliance -cardiology on board -will follow rec's -S/P Myoview, waiting final results  Code Status: Full Code.  Family Communication: care discussed with patient.  Disposition Plan:   Consultants: Neurology  Procedures:  ECHO: - Left ventricle: The cavity size was normal. There was moderate concentric hypertrophy. Systolic function was severely reduced. The estimated ejection fraction was in the range of 25% to 30%. Diffuse hypokinesis. There is  akinesis of the anterior myocardium. Acoustic contrast opacification revealed no evidence ofthrombus.  Impressions: - No cardiac source of emboli was indentified.   Carotid doppler: Findings suggest 1-39% internal carotid artery stenosis bilaterally. Vertebral arteries are patent with antegrade flow.  Myoview: pending      Antibiotics:  none  HPI/Subjective: Marisa Forbes is feeling better, denies CP and SOB. S/p Myoview  Objective: Filed Vitals:   04/14/15 1400  BP: 126/71  Pulse: 67  Temp: 97.8 F (36.6 C)  Resp: 17   No intake or output data in the 24 hours ending 04/14/15 1600 Filed Weights   04/14/15 0812  Weight: 64.4 kg (141 lb 15.6 oz)    Exam:   General:  Alert, in no distress; afebrile and denies CP.   Cardiovascular: S1, S 2 RRR  Respiratory: CTA  Abdomen: BS present, soft, nt, positive BS  Musculoskeletal: no edema  Neuro: double vision better.    Data Reviewed: Basic Metabolic Panel:  Recent Labs Lab 04/11/15 1610 04/11/15 1617 04/11/15 1930 04/12/15 0810 04/13/15 0554  NA 135 138  --  140 139  K 4.0 3.7  --  3.3* 3.9  CL 103 101  --  109 107  CO2 21*  --   --  24 24  GLUCOSE 390* 397*  --  160* 171*  BUN 14 14  --  13 8  CREATININE 0.50 0.30* 0.43* 0.45 0.41*  CALCIUM 9.4  --   --  9.0 8.9   Liver Function Tests:  Recent Labs Lab 04/11/15 1610  AST 15  ALT 15  ALKPHOS 98  BILITOT 0.7  PROT 7.0  ALBUMIN 4.3   CBC:  Recent Labs Lab 04/11/15 1610 04/11/15 1617 04/11/15 1930 04/12/15 0810 04/13/15 0554 04/14/15 0423  WBC 4.0  --  4.3 4.7 5.1 3.7*  NEUTROABS 2.6  --   --   --   --   --   HGB 17.3* 17.7* 17.3* 15.7* 15.7* 13.4  HCT 50.1* 52.0* 48.2* 46.3* 45.6 40.1  MCV 85.5  --  83.0 85.4 85.6 85.3  PLT 186  --  167 182 164 153   CBG:  Recent Labs Lab 04/13/15 1948 04/14/15 0005 04/14/15 0355 04/14/15 0800 04/14/15 1255  GLUCAP 215* 222* 182* 106* 128*    Recent Results (from the past 240  hour(s))  Urine culture     Status: None   Collection Time: 04/12/15 11:15 AM  Result Value Ref Range Status   Specimen Description URINE, RANDOM  Final   Special Requests NONE  Final   Colony Count   Final    >=100,000 COLONIES/ML Performed at [facility]    Culture   Final    ESCHERICHIA COLI Performed at [facility]    Report Status 04/14/2015 FINAL  Final   Organism ID, Bacteria ESCHERICHIA COLI  Final      Susceptibility   Escherichia coli - MIC*    AMPICILLIN <=2 SENSITIVE Sensitive     CEFAZOLIN <=4 SENSITIVE Sensitive     CEFTRIAXONE <=1 SENSITIVE Sensitive     CIPROFLOXACIN <=0.25 SENSITIVE Sensitive     GENTAMICIN <=1 SENSITIVE Sensitive     LEVOFLOXACIN <=0.12 SENSITIVE Sensitive     NITROFURANTOIN <=16 SENSITIVE Sensitive     TOBRAMYCIN <=1 SENSITIVE Sensitive     TRIMETH/SULFA <=20 SENSITIVE Sensitive     PIP/TAZO <=4 SENSITIVE Sensitive     * ESCHERICHIA COLI     Studies: No results found.  Scheduled Meds: . amLODipine  5 mg Oral BID  . aspirin  300 mg Rectal Daily   Or  . aspirin  325 mg Oral Daily  . atorvastatin  40 mg Oral q1800  . carvedilol  25 mg Oral BID WC  . heparin  5,000 Units Subcutaneous 3 times per day  . insulin aspart  0-9 Units Subcutaneous 6 times per day  . insulin glargine  12 Units Subcutaneous QHS  . isosorbide mononitrate  60 mg Oral Daily  . lisinopril  40 mg Oral Q breakfast  . regadenoson       Continuous Infusions: . sodium chloride 100 mL/hr at 04/14/15 0730    Time spent: 30 minutes.    Barton Dubois  [facility] 04/14/2015, 4:00 PM  LOS: 3 days

## 2015-04-15 DIAGNOSIS — I6359 Cerebral infarction due to unspecified occlusion or stenosis of other cerebral artery: Secondary | ICD-10-CM

## 2015-04-15 LAB — GLUCOSE, CAPILLARY
Glucose-Capillary: 191 mg/dL — ABNORMAL HIGH (ref 65–99)
Glucose-Capillary: 201 mg/dL — ABNORMAL HIGH (ref 65–99)
Glucose-Capillary: 255 mg/dL — ABNORMAL HIGH (ref 65–99)
Glucose-Capillary: 87 mg/dL (ref 65–99)
Glucose-Capillary: 91 mg/dL (ref 65–99)
Glucose-Capillary: 95 mg/dL (ref 65–99)

## 2015-04-15 NOTE — Progress Notes (Addendum)
[facility] PROGRESS NOTE  Marisa Forbes EYC:[id number] DOB: 20-Feb-1951 DOA: 04/11/2015 PCP: Scarlette Calico, MD  Assessment/Plan:  brainstem TIA from small vessel diseases vs Partial 3rd nerve palsy  -Diplopia continue improving   -ECHO reveals an EF of 20-25% and diffuse hypokinesis; no source of emboli -Carotid Doppler-  no significant stenosis.  -continue agressive Risk factors modification.  -MRI; negative for acute stroke, showed old strokes.  -Continue with aspirin 325 mg for secondary prevention.  -LDL: 139 - continue on Lipitor.   Systolic heart failure/cardiomyopathy -EF found to be 20-25% -cardiology consulted, will follow rec's -Explained to patient this is likely due to medication non-compliance and diet indiscretion -Resume lisinopril; patient also on nitrates -continue metoprolol and encourage to follow low sodium diet  -Will have life vest set up to wear for the next 3 month and if no improvement of her ejection fraction seen, will require ICD placement  HTN, malignant;  -Presents with SBP 227/119 -Much better now. Will continue Adjusting medications to maintain blood pressure control  -patient educated about medication compliance and low sodium diet  Diabetes mellitus, uncontrolled. -HbA1c 13.2  -Continue with lantus.  -Blood sugar better controlled. -patient not compliant with medications or diet prior to admission  Elevated Hb -Likely hemoconcentrated -Improved/resolved with hydration  -Will monitor  New large L parotid mass 8x19mm - needs follow up to be arranged by her PCP.   Coronary artery disease; continue with aspirin, metoprolol, statins and nitrates.  -patient with hx of medication non-compliance -cardiology on board -will follow rec's -S/P MR cardiac stress test, results no need for catheterization at this moment -Continue aggressive medical management  Code Status: Full Code.  Family Communication: care discussed with patient.   Disposition Plan:   Consultants: Neurology  Procedures:  ECHO: - Left ventricle: The cavity size was normal. There was moderate concentric hypertrophy. Systolic function was severely reduced. The estimated ejection fraction was in the range of 25% to 30%. Diffuse hypokinesis. There is akinesis of the anterior myocardium. Acoustic contrast opacification revealed no evidence ofthrombus.  Impressions: - No cardiac source of emboli was indentified.   Carotid doppler: Findings suggest 1-39% internal carotid artery stenosis bilaterally. Vertebral arteries are patent with antegrade flow.  MR cardiac stress test: Per cardiology the findings are showing that the degree of left ventricular systolic impairment are out of proportion to the infarct area and other causes such as hypertension and prior chemotherapy are most probably contributing to the degree of cardiomyopathy.   Antibiotics:  none  HPI/Subjective: Marisa Forbes is feeling much better, denies CP and SOB.   Objective: Filed Vitals:   04/15/15 1357  BP: 137/74  Pulse: 68  Temp: 97.8 F (36.6 C)  Resp: 16   No intake or output data in the 24 hours ending 04/15/15 1606 Filed Weights   04/14/15 0812  Weight: 64.4 kg (141 lb 15.6 oz)    Exam:   General:  Alert, awake and oriented 3; denies any distress; is afebrile and without shortness of breath or CP.   Cardiovascular: S1, S2 RRR; no rubs or gallops  Respiratory: CTA bilaterally  Abdomen: BS present, soft, nt, positive BS  Musculoskeletal: no edema  Neuro: double vision continued to improve     Data Reviewed: Basic Metabolic Panel:  Recent Labs Lab 04/11/15 1610 04/11/15 1617 04/11/15 1930 04/12/15 0810 04/13/15 0554  NA 135 138  --  140 139  K 4.0 3.7  --  3.3* 3.9  CL 103 101  --  109 107  CO2 21*  --   --  24 24  GLUCOSE 390* 397*  --  160* 171*  BUN 14 14  --  13 8  CREATININE 0.50 0.30* 0.43* 0.45 0.41*  CALCIUM 9.4  --    --  9.0 8.9   Liver Function Tests:  Recent Labs Lab 04/11/15 1610  AST 15  ALT 15  ALKPHOS 98  BILITOT 0.7  PROT 7.0  ALBUMIN 4.3   CBC:  Recent Labs Lab 04/11/15 1610 04/11/15 1617 04/11/15 1930 04/12/15 0810 04/13/15 0554 04/14/15 0423  WBC 4.0  --  4.3 4.7 5.1 3.7*  NEUTROABS 2.6  --   --   --   --   --   HGB 17.3* 17.7* 17.3* 15.7* 15.7* 13.4  HCT 50.1* 52.0* 48.2* 46.3* 45.6 40.1  MCV 85.5  --  83.0 85.4 85.6 85.3  PLT 186  --  167 182 164 153   CBG:  Recent Labs Lab 04/14/15 1958 12-May-2015 0012 May 12, 2015 0416 05-12-15 0733 05/12/15 1121  GLUCAP 361* 95 87 91 255*    Recent Results (from the past 240 hour(s))  Urine culture     Status: None   Collection Time: 04/12/15 11:15 AM  Result Value Ref Range Status   Specimen Description URINE, RANDOM  Final   Special Requests NONE  Final   Colony Count   Final    >=100,000 COLONIES/ML Performed at [facility]    Culture   Final    ESCHERICHIA COLI Performed at [facility]    Report Status 04/14/2015 FINAL  Final   Organism ID, Bacteria ESCHERICHIA COLI  Final      Susceptibility   Escherichia coli - MIC*    AMPICILLIN <=2 SENSITIVE Sensitive     CEFAZOLIN <=4 SENSITIVE Sensitive     CEFTRIAXONE <=1 SENSITIVE Sensitive     CIPROFLOXACIN <=0.25 SENSITIVE Sensitive     GENTAMICIN <=1 SENSITIVE Sensitive     LEVOFLOXACIN <=0.12 SENSITIVE Sensitive     NITROFURANTOIN <=16 SENSITIVE Sensitive     TOBRAMYCIN <=1 SENSITIVE Sensitive     TRIMETH/SULFA <=20 SENSITIVE Sensitive     PIP/TAZO <=4 SENSITIVE Sensitive     * ESCHERICHIA COLI     Studies: Mr Cardiac Stress Test  05-12-15   CLINICAL DATA:  64 year old female with h/o STEMI, s/p PCI to LAD and post chemotherapy. Evaluate for cardiomyopathy.  EXAM: CARDIAC MRI  TECHNIQUE: The patient was scanned on a 1.5 Tesla GE magnet. A dedicated cardiac coil was used. Functional imaging was done using Fiesta sequences. 2,3, and 4 chamber  views were done to assess for RWMA's. Modified Simpson's rule using a short axis stack was used to calculate an ejection fraction on a dedicated work Conservation officer, nature. The patient received 22 cc of Multihance. After 10 minutes inversion recovery sequences were used to assess for infiltration and scar tissue.  Pharmacologic stress test was performed using regadenoson 0.4 mg iv. There was mild SOB that resolved within 5 minutes.  CONTRAST:  22 cc  of Multihance  FINDINGS: 1. Normal left ventricular size with moderate concentric hypertrophy and severely decreased systolic function (LVEF = 26%).  There is akinesis of the basal anterior, mid anterior, anteroseptal and apical anterior and septal walls.  LVEDD:  53 mm  LVESD:  50 mm  LVEDV:  145 ml  LVESV:  111 ml  SV:  34 mm  CO:  2.0 L/min  2. Normal right ventricular size,  thickness and low normal systolic function (RVEF = 42%).  RVEDV:  90 ml  RVESV:  52 ml  SV:  38 ml  3.  Normal biatrial size.  4. Trivial mitral regurgitation.  5. There is perfusion defect at stress and at rest in the entire anterior, mid anteroseptal and apical septal walls. There is also mid inferoseptal perfusion defect at stress.  6. There is 50-75 % late gadolinium in the basal anterior wall and 75-100 % transmural late gadolinium enhancement in the mid and apical anterior, mid anteroseptal and apical anterior walls.  IMPRESSION: 1. Normal left ventricular size with moderate concentric hypertrophy and severely decreased systolic function (LVEF = 26%). There is akinesis of the basal anterior, mid anterior, anteroseptal and apical anterior and septal walls.  2. Normal right ventricular size, thickness and low normal systolic function (RVEF = 42%).  3. Stress test indicates that there is a scar in the entire anterior, mid anteroseptal and apical septal walls with minimal ischemia in the mid inferoseptal wall.  4. Late gadolinium images show a scar a scar in the entire anterior, mid  anteroseptal and apical septal walls with poor recovery if revascularized.  Collectively, these findings are showing that the degree of left ventricular systolic impairment are out of proportion to the infarct area and other causes such as hypertension and prior chemotherapy are most probably contributing to the degree of cardiomyopathy.  I would not recommend a cath but rather aggressive management of hypertension and treatment of systolic heart failure.  Ena Dawley   Electronically Signed   By: Ena Dawley   On: 04/15/2015 09:02    Scheduled Meds: . amLODipine  5 mg Oral BID  . aspirin  300 mg Rectal Daily   Or  . aspirin  325 mg Oral Daily  . atorvastatin  40 mg Oral q1800  . carvedilol  25 mg Oral BID WC  . heparin  5,000 Units Subcutaneous 3 times per day  . insulin aspart  0-9 Units Subcutaneous 6 times per day  . insulin glargine  12 Units Subcutaneous QHS  . isosorbide mononitrate  60 mg Oral Daily  . lisinopril  40 mg Oral Q breakfast   Continuous Infusions: . sodium chloride 100 mL/hr at 04/14/15 2101    Time spent: 30 minutes.    Barton Dubois  [facility] 04/15/2015, 4:06 PM  LOS: 4 days

## 2015-04-15 NOTE — Progress Notes (Signed)
Patient Name: Marisa Forbes Date of Encounter: 04/15/2015  Principal Problem:   CVA (cerebral infarction) Active Problems:   Essential hypertension   DM (diabetes mellitus)   CAD - STEMI s/p urgent LAD DES 09/07/12, residual RCA and Dx disease, EF 45-50%   Hypertensive urgency   Diplopia   3Rd cranial nerve palsy   Congestive dilated cardiomyopathy   Stroke   SUBJECTIVE   Denies chest pain, sob or palpitation. She want to know that she is a candidate for possible LifeVest placement vs defibrillation.   CURRENT MEDS . amLODipine  5 mg Oral BID  . aspirin  300 mg Rectal Daily   Or  . aspirin  325 mg Oral Daily  . atorvastatin  40 mg Oral q1800  . carvedilol  25 mg Oral BID WC  . heparin  5,000 Units Subcutaneous 3 times per day  . insulin aspart  0-9 Units Subcutaneous 6 times per day  . insulin glargine  12 Units Subcutaneous QHS  . isosorbide mononitrate  60 mg Oral Daily  . lisinopril  40 mg Oral Q breakfast    OBJECTIVE  Filed Vitals:   04/15/15 0139 04/15/15 0617 04/15/15 0941 04/15/15 1357  BP: 141/71 136/70 142/68 137/74  Pulse: 60 62 74 68  Temp: 97.6 F (36.4 C) 97 F (36.1 C) 98.2 F (36.8 C) 97.8 F (36.6 C)  TempSrc: Oral Oral Oral Oral  Resp: 16 16 16 16   Height:      Weight:      SpO2: 99% 98% 97% 96%   No intake or output data in the 24 hours ending 04/15/15 1412 Filed Weights   04/14/15 0812  Weight: 141 lb 15.6 oz (64.4 kg)    PHYSICAL EXAM  General: Pleasant, NAD. Neuro: Alert and oriented X 3. Moves all extremities spontaneously. Psych: Normal affect. HEENT:  Normal  Neck: Supple without bruits or JVD. Lungs:  Resp regular and unlabored, CTA. Heart: RRR no s3, s4, or murmurs. Abdomen: Soft, non-tender, non-distended, BS + x 4.  Extremities: No clubbing, cyanosis or edema. DP/PT/Radials 2+ and equal bilaterally.  Accessory Clinical Findings  CBC  Recent Labs  04/13/15 0554 04/14/15 0423  WBC 5.1 3.7*  HGB 15.7* 13.4    HCT 45.6 40.1  MCV 85.6 85.3  PLT 164 382   Basic Metabolic Panel  Recent Labs  04/13/15 0554  NA 139  K 3.9  CL 107  CO2 24  GLUCOSE 171*  BUN 8  CREATININE 0.41*  CALCIUM 8.9   Radiology/Studies               Echo 04/13/15 ------------------------------------------------------------------- LV EF: 25% -  30%  ------------------------------------------------------------------- Indications:   CVA 436. 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  ------------------------------------------------------------------- History:  PMH:  Coronary artery disease. PMH:  Myocardial infarction. Risk factors: Hypertension. Diabetes mellitus. Dyslipidemia.  ------------------------------------------------------------------- Study Conclusions  - Left ventricle: The cavity size was normal. There was moderate concentric hypertrophy. Systolic function was severely reduced. The estimated ejection fraction was in the range of 25% to 30%. Diffuse hypokinesis. There is akinesis of the anterior myocardium. Acoustic contrast opacification revealed no evidence ofthrombus.  Impressions:  - No cardiac source of emboli was indentified.   Cardiac stress MRI 04/14/15 FINDINGS: 1. Normal left ventricular size with moderate concentric hypertrophy and severely decreased systolic function (LVEF = 26%).  There is akinesis of the basal anterior, mid anterior, anteroseptal and apical anterior and septal walls.  LVEDD: 53 mm  LVESD: 50 mm  LVEDV: 145 ml  LVESV: 111 ml  SV: 34 mm  CO: 2.0 L/min  2. Normal right ventricular size, thickness and low normal systolic function (RVEF = 42%).  RVEDV: 90 ml  RVESV: 52 ml  SV: 38 ml  3. Normal biatrial size.  4. Trivial mitral regurgitation.  5. There is perfusion defect at stress and at rest in the entire anterior, mid anteroseptal and apical septal walls. There is also mid inferoseptal perfusion  defect at stress.  6. There is 50-75 % late gadolinium in the basal anterior wall and 75-100 % transmural late gadolinium enhancement in the mid and apical anterior, mid anteroseptal and apical anterior walls.  IMPRESSION: 1. Normal left ventricular size with moderate concentric hypertrophy and severely decreased systolic function (LVEF = 26%). There is akinesis of the basal anterior, mid anterior, anteroseptal and apical anterior and septal walls.  2. Normal right ventricular size, thickness and low normal systolic function (RVEF = 42%).  3. Stress test indicates that there is a scar in the entire anterior, mid anteroseptal and apical septal walls with minimal ischemia in the mid inferoseptal wall.  4. Late gadolinium images show a scar a scar in the entire anterior, mid anteroseptal and apical septal walls with poor recovery if revascularized.  Collectively, these findings are showing that the degree of left ventricular systolic impairment are out of proportion to the infarct area and other causes such as hypertension and prior chemotherapy are most probably contributing to the degree of cardiomyopathy.  I would not recommend a cath but rather aggressive management of hypertension and treatment of systolic heart failure.   ASSESSMENT AND PLAN  1. Systolic heart failure/cardiomyopathy - Echo 04/13/15 showed LV EF of 25-30%, which is reduced from 45-50% from last echo 04/2013. She stopped all of her BP about 6 months ago.  - Cardiac stress MRI showed LVEF of 26%; scar in the entire anterior, mid anteroseptal and apical septal walls with minimal ischemia in the mid inferoseptal wall; likely due to HTN and prior chemotherapy. Dr. Meda Coffee recommended not to do a cath rather aggresive management of HTN and systolic heart failure. - Dr. Dyann Kief mention to me for placement of LifeVest or Defibrillation. MD to decide. Consider repeat echo in 3 months.  -Continue coreg 25mg  BID and  ACEI  2. Hypertensive urgency - stopped antihypertensive medication 6 months ago.  - Continue Coreg 25mg  BID, lisinopril 40mg , amlodipine 5mg , imdur 60mg . BP improved. Noted she hansn't received morning dose of imdur and lisinopril. Likely pt went down for imaging.   3. 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  - neurology following.  4. HL - Lipitor increased to 40mg  yesterday.  -04/12/2015: Cholesterol 208*; HDL 50; LDL Cholesterol 139*; Triglycerides 95; VLDL 19  5. DM  - HgbA1c of 13.2. Management per Im  6. ASCAD -s/p remote anterior STEMI in 2013 with PCI of LAD and non flow limiting lesions in the RCA and diagonal.  - asymptomatic.  Continue ASA/long acting nitrates/statin/BB/ACEI.    Jarrett Soho PA-C Pager [phone]  Patient seen and examined. Agree with assessment and plan. Stress MRI reviewed. EF 26%. Now back on meds with BB, ACE-I, nitrates and amlodipine. IF BP or CHF still an issue can add aldosterone blockade. Had long discussion with pt and son. Recommend life-vest for ~ 3 mo to allow time for potential improvement in  LV FXN. IF does not improve then will need ICD. Will check BNP.   Troy Sine, MD, [facility] 04/15/2015 2:17 PM

## 2015-04-15 NOTE — Progress Notes (Signed)
Spoke with Dr Dyann Kief, who states that patient will needing a Life Vest for discharge.  CM left a voicemail and email for [facility]. Awaiting return call. Papers orders received from Dr Dyann Kief and placed on shadow chart.  CM will follow-up in the morning to continue arrangements.

## 2015-04-16 DIAGNOSIS — H4902 Third [oculomotor] nerve palsy, left eye: Secondary | ICD-10-CM

## 2015-04-16 LAB — BRAIN NATRIURETIC PEPTIDE: B Natriuretic Peptide: 68.9 pg/mL (ref 0.0–100.0)

## 2015-04-16 LAB — CBC
HCT: 42.3 % (ref 36.0–46.0)
Hemoglobin: 14.2 g/dL (ref 12.0–15.0)
MCH: 29 pg (ref 26.0–34.0)
MCHC: 33.6 g/dL (ref 30.0–36.0)
MCV: 86.3 fL (ref 78.0–100.0)
Platelets: 160 10*3/uL (ref 150–400)
RBC: 4.9 MIL/uL (ref 3.87–5.11)
RDW: 13 % (ref 11.5–15.5)
WBC: 3.7 10*3/uL — ABNORMAL LOW (ref 4.0–10.5)

## 2015-04-16 LAB — GLUCOSE, CAPILLARY
Glucose-Capillary: 160 mg/dL — ABNORMAL HIGH (ref 65–99)
Glucose-Capillary: 146 mg/dL — ABNORMAL HIGH (ref 65–99)
Glucose-Capillary: 258 mg/dL — ABNORMAL HIGH (ref 65–99)
Glucose-Capillary: 137 mg/dL — ABNORMAL HIGH (ref 65–99)
Glucose-Capillary: 226 mg/dL — ABNORMAL HIGH (ref 65–99)
Glucose-Capillary: 244 mg/dL — ABNORMAL HIGH (ref 65–99)

## 2015-04-16 MED ORDER — ISOSORBIDE MONONITRATE ER 60 MG PO TB24: 60.0000 mg | ORAL_TABLET | Freq: Every day | ORAL | Status: DC

## 2015-04-16 MED ORDER — HYDRALAZINE HCL 10 MG PO TABS: 10.0000 mg | ORAL_TABLET | Freq: Two times a day (BID) | ORAL | Status: DC

## 2015-04-16 MED ORDER — CARVEDILOL 25 MG PO TABS: 25.0000 mg | ORAL_TABLET | Freq: Two times a day (BID) | ORAL | Status: DC

## 2015-04-16 MED ORDER — LISINOPRIL 40 MG PO TABS: 40.0000 mg | ORAL_TABLET | Freq: Every day | ORAL | Status: DC

## 2015-04-16 MED ORDER — INSULIN GLARGINE 100 UNIT/ML SOLOSTAR PEN: PEN_INJECTOR | SUBCUTANEOUS | Status: DC

## 2015-04-16 MED ORDER — INSULIN ASPART 100 UNIT/ML FLEXPEN: 12.0000 [IU] | PEN_INJECTOR | Freq: Three times a day (TID) | SUBCUTANEOUS | Status: DC

## 2015-04-16 MED ORDER — CEFUROXIME AXETIL 500 MG PO TABS: 500.0000 mg | ORAL_TABLET | Freq: Two times a day (BID) | ORAL | Status: DC

## 2015-04-16 MED ORDER — ASPIRIN 81 MG PO TBEC: 81.0000 mg | DELAYED_RELEASE_TABLET | Freq: Every day | ORAL | Status: AC

## 2015-04-16 MED ORDER — ATORVASTATIN CALCIUM 80 MG PO TABS: 80.0000 mg | ORAL_TABLET | Freq: Every day | ORAL | Status: DC

## 2015-04-16 MED ORDER — METFORMIN HCL ER 750 MG PO TB24: 750.0000 mg | ORAL_TABLET | Freq: Every day | ORAL | Status: DC

## 2015-04-16 NOTE — Care Management Note (Signed)
Case Management Note  Patient Details  Name: YOLANI VO MRN: [id number] Date of Birth: [birth year]  Subjective/Objective:                    Action/Plan: Spoke with Monica Martinez wit [employer], who verified that insurance has approved patient's Life Vest.  The vest will be arriving between 5 and 6pm today.  Patient will need to watch the Life Vest Video #123 prior to arrival.  Patient and bedside RN are aware.  Expected Discharge Date:                  Expected Discharge Plan:  Home/Self Care  In-House Referral:     Discharge planning Services  CM Consult  Post Acute Care Choice:    Choice offered to:     DME Arranged:  Vest life vest DME Agency:  Other - Comment ([facility])  [place] Arranged:    [place] Agency:     Status of Service:  Completed, signed off  Medicare Important Message Given:    Date Medicare IM Given:    Medicare IM give by:    Date Additional Medicare IM Given:    Additional Medicare Important Message give by:     If discussed at [place] of Stay Meetings, dates discussed:    Additional Comments:  Rolm Baptise, RN 04/16/2015, 11:58 AM

## 2015-04-16 NOTE — Progress Notes (Signed)
Discharge orders received, pt for discharge home today , IV D/C  D/C instructions and Rx given with verbalized understanding.  Life Vest video viewed by pt and her husband. Family at bedside to assist pt with discharge. Staff brought pt downstairs via wheelchair.

## 2015-04-16 NOTE — Discharge Summary (Addendum)
Physician Discharge Summary  Marisa Forbes PFX:[id number] DOB: 09-13-1951 DOA: 04/11/2015  PCP: Scarlette Calico, MD  Admit date: 04/11/2015 Discharge date: 04/16/2015  Time spent: >30 minutes  Recommendations for Outpatient Follow-up:  1. Please arrange follow up/work up for parotid mass discovered during this admission  2. Reassess BP and continue adjusting antihypertensive regimen as needed 3. Close follow up to CBG/A1C and further adjustment of hypoglycemic regimen 4. Repeat BMET to assess electrolytes and renal function   Discharge Diagnoses:  HX of CVA (cerebral infarction) TIA (transient ischemic attack) uncomplicated E. 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 Will discharge home with life vest and close follow up by PCP and cardiologist   Diet recommendation: heart healthy and low carbohydrates diet   Filed Weights   04/14/15 0812  Weight: 64.4 kg (141 lb 15.6 oz)    History of present illness:  64 y.o. female, with past medical history of breast cancer, hypertension, diabetes mellitus, coronary artery disease, embolic CVA in the past, since with complaint of double vision, and unsteady gait, patient reportedly from a previous CVA she had residual left visual field deficit, and poor balance, but reports yesterday afternoon she noticed herself to have double vision, and having more wobbly gait and usual, she presents to ED today, the head showing no evidence of acute intracranial finding, old infarct and chronic microvascular disease. -in ED was noticed to have uncontrolled blood pressure 227/119, she reports she stopped taking all  antihypertensives medication before 6 months without consulting her doctor, as she thought medications were not helping, blood glucose uncontrolled as well with level of 397, for which she stopped taking her metformin and Lantus 6 months ago as well.  Hospital Course:  brainstem TIA from small vessel diseases vs Partial 3rd nerve palsy  -Diplopia continue improving  -ECHO reveals an EF of 20-25% and diffuse hypokinesis; no source of emboli -Carotid Doppler- no significant stenosis.  -continue agressive Risk factors modification.  -MRI; negative for acute stroke, showed old strokes.  -Continue with aspirin 81 mg for secondary prevention.  -LDL: 139 - continue on Lipitor.   Systolic heart failure/cardiomyopathy -EF found to be 20-25% -cardiology consulted, will follow rec's -Explained to patient this is likely due to medication non-compliance and diet indiscretion -Resume lisinopril; patient also on nitrates -continue metoprolol and encourage to follow low sodium diet  -Will have life vest set up to wear for the next 3 month and if no improvement of her ejection fraction seen, will require ICD placement  HTN, malignant;  -Presents with SBP 227/119 -Much better at discharge.  -Will required continue Adjustment of medications to maintain blood pressure control  -patient educated about medication compliance and low sodium diet  Diabetes mellitus, uncontrolled. -HbA1c 13.2  -Continue with lantus, metformin and novolog TID.  -Blood sugar better controlled at discharge. -patient not compliant with medications or diet prior to admission -will need further adjustment per PCP  Elevated Hb -Likely hemoconcentrated -Improved/resolved with hydration  -Will follow CBC during follow up visit   E. Coli UTI -no dysuria -advise to maintain adequate hydration -will treat with ceftin  New large L parotid mass 8x47mm - needs outpatient follow up to be arranged by her PCP.    Coronary  artery disease; continue with aspirin, metoprolol, statins and nitrates.  -patient with hx of medication non-compliance -cardiology on board, recommending aggressive medical management -S/P MR cardiac stress test, results demonstrated no need for catheterization at this moment  HLD -continue statins  Procedures:  ECHO: - Left ventricle: The cavity size was normal. There was moderate concentric hypertrophy. Systolic function was severely reduced. The estimated ejection fraction was in the range of 25% to 30%. Diffuse hypokinesis. There is akinesis of the anterior myocardium. Acoustic contrast opacification revealed no evidence ofthrombus.  Impressions: - No cardiac source of emboli was indentified.   Carotid doppler: Findings suggest 1-39% internal carotid artery stenosis bilaterally. Vertebral arteries are patent with antegrade flow.   MR cardiac stress test: Per cardiology the findings are showing that the degree of left ventricular systolic impairment are out of proportion to the infarct area and other causes such as hypertension and prior chemotherapy are most probably contributing to the degree of cardiomyopathy.  Consultations:  Cardiology  Neurology   Discharge Exam: Filed Vitals:   04/16/15 0941  BP: 137/60  Pulse: 65  Temp: 97.7 F (36.5 C)  Resp: 16    General: Alert, in no distress; afebrile and denies CP, SOB or orthopnea   Cardiovascular: S1, S 2 RRR, no JVD  Respiratory: CTA bilaterally  Abdomen: BS present, soft, nt, positive BS  Musculoskeletal: no edema, no cyanosis or clubbing  Neuro: double vision better/resolved at discharge according to patient.  Discharge Instructions   Discharge Instructions    Ambulatory referral to Neurology    Complete by:  As directed   Please schedule post stroke follow up in 2 months.     Diet - low sodium heart healthy    Complete by:  As directed      Discharge instructions     Complete by:  As directed   Follow up with Dr. Harrington Challenger in 2 weeks Arrange follow up with PCP in 10 days Follow up with Neurologist in 2 months Follow heart healthy diet (less than 2 grams daily) Please check your weight on daily basis (contact cardiologist with increase weight of > 3 pounds overnight and/or > 5 pounds in 1 week) Please maintain adequate hydration and make sue to be compliant with medications as prescribed          Current Discharge Medication List    START taking these medications   Details  carvedilol (COREG) 25 MG tablet Take 1 tablet (25 mg total) by mouth 2 (two) times daily with a meal. Qty: 60 tablet, Refills: 1    hydrALAZINE (APRESOLINE) 10 MG tablet Take 1 tablet (10 mg total) by mouth 2 (two) times daily. Qty: 60 tablet, Refills: 2      CONTINUE these medications which have CHANGED   Details  aspirin 81 MG EC tablet Take 1 tablet (81 mg total) by mouth daily. Qty: 30 tablet, Refills: 12    atorvastatin (LIPITOR) 80 MG tablet Take 1 tablet (80 mg total) by mouth daily with supper. Qty: 90 tablet, Refills: 3    insulin aspart (NOVOLOG) 100 UNIT/ML FlexPen Inject 12 Units into the skin 3 (three) times daily with meals. Qty: 15 mL, Refills: 1    Insulin Glargine (LANTUS SOLOSTAR) 100 UNIT/ML Solostar Pen Inject 18 units Tillar at bedtime. Qty: 1 pen, Refills: 3    isosorbide mononitrate (IMDUR) 60 MG 24 hr tablet Take 1 tablet (60 mg total) by mouth daily. Qty: 90 tablet, Refills: 3    lisinopril (PRINIVIL,ZESTRIL)  40 MG tablet Take 1 tablet (40 mg total) by mouth daily with breakfast. Qty: 90 tablet, Refills: 3    metFORMIN (GLUCOPHAGE XR) 750 MG 24 hr tablet Take 1 tablet (750 mg total) by mouth daily with breakfast. Qty: 30 tablet, Refills: 1      CONTINUE these medications which have NOT CHANGED   Details  glucose blood (ONETOUCH VERIO) test strip Use TID Qty: 100 each, Refills: 12   Associated Diagnoses: Diabetes type 2, uncontrolled     nitroGLYCERIN (NITROSTAT) 0.4 MG SL tablet Place 0.4 mg under the tongue every 5 (five) minutes as needed for chest pain.      STOP taking these medications     ibuprofen (ADVIL,MOTRIN) 200 MG tablet      amLODipine (NORVASC) 5 MG tablet      hydrochlorothiazide (HYDRODIURIL) 25 MG tablet      metoprolol (LOPRESSOR) 100 MG tablet        No Known Allergies Follow-up Information    Follow up with SETHI,PRAMOD, MD In 2 months.   Specialties:  Neurology, Radiology   Why:  Stroke [facility], Office will call you with appointment date & time   Contact information:   [place] [address] [phone]       Follow up with Scarlette Calico, MD. Schedule an appointment as soon as possible for a visit in 10 days.   Specialty:  Internal Medicine   Contact information:   [address] [address] [phone]       Follow up with Dorris Carnes, MD. Schedule an appointment as soon as possible for a visit in 2 weeks.   Specialty:  Cardiology   Contact information:   [place]  [address] [phone]       The results of significant diagnostics from this hospitalization (including imaging, microbiology, ancillary and laboratory) are listed below for reference.    Significant Diagnostic Studies: Ct Head Wo Contrast  04/11/2015   CLINICAL DATA:  Vision changes, hyperglycemia  EXAM: CT HEAD WITHOUT CONTRAST  TECHNIQUE: Contiguous axial images were obtained from the base of the skull through the vertex without intravenous contrast.  COMPARISON:  MR brain 06/27/2013  FINDINGS: There is no evidence of mass effect, midline shift, or extra-axial fluid collections. There is no evidence of a space-occupying lesion or intracranial hemorrhage. There is no evidence of a cortical-based area of acute infarction. There is an old right posterior parietal lobe infarct with encephalomalacia. There is an old small right cerebellar  infarct. There is periventricular white matter low attenuation likely secondary to microangiopathy.  The ventricles and sulci are appropriate for the patient's age. The basal cisterns are patent.  Visualized portions of the orbits are unremarkable. The visualized portions of the paranasal sinuses and mastoid air cells are unremarkable. Cerebrovascular atherosclerotic calcifications are noted.  The osseous structures are unremarkable.  IMPRESSION: 1. No acute intracranial pathology. 2. Chronic microvascular disease .   Electronically Signed   By: Kathreen Devoid   On: 04/11/2015 16:30   Mr Brain Wo Contrast  04/11/2015   CLINICAL DATA:  Diplopia, worsening balance beginning yesterday at 3 p.m. History of stroke, diabetes, hypertension, breast cancer.  EXAM: MRI HEAD WITHOUT CONTRAST  MRA HEAD WITHOUT CONTRAST  TECHNIQUE: Multiplanar, multiecho pulse sequences of the brain and surrounding structures were obtained without intravenous contrast. Angiographic images of the head were obtained using MRA technique without contrast.  COMPARISON:  CT head  April 11, 2015 at 1626 hours and MRI of the brain June 27, 2013  FINDINGS: MRI HEAD FINDINGS  No reduced diffusion to suggest acute ischemia. New subcentimeter focus of susceptibility artifact RIGHT pons, LEFT mesial temporal lobe, with additional scattered small foci of susceptibility artifact which were present previously. Old RIGHT occipital lobe hemorrhagic infarct. Ventricles and sulci are normal for patient's age. Patchy supratentorial, pontine and RIGHT cerebellar white matter T2 hyperintensities are advanced from prior imaging. No midline shift or mass effect. Remote small RIGHT inferior cerebellar infarcts were present previously. Small rib remote RIGHT brachium pontis infarct. Remote RIGHT thalamus lacunar infarcts.  No abnormal extra-axial fluid collections. Status post bilateral ocular lens implants. Paranasal sinuses and mastoid air cells are well aerated.  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 No abnormal sellar expansion. No cerebellar tonsillar ectopia. No suspicious calvarial bone marrow signal. Scattered scalp small sebaceous cysts. New 8 x 13 mm low signal mass LEFT parotid gland with reduced diffusion.  MRA HEAD FINDINGS  Anterior circulation: Normal flow related enhancement of the included cervical, petrous, cavernous and supra clinoid internal carotid arteries. Moderate symmetric stenosis of the proximal supraclinoid internal carotid arteries, likely due to atherosclerosis. Patent anterior communicating artery. Moderate stenosis LEFT A1 segment origin. Patent anterior communicating artery with normal flow related enhancement of the anterior cerebral arteries. Moderate luminal irregularity/stenosis LEFT M1 segment. Bilateral middle cerebral arteries are patent including mid to distal segments.  No large vessel occlusion, high-grade stenosis, aneurysm.  Posterior circulation: RIGHT vertebral artery is dominant. Basilar artery is patent, with normal flow related enhancement of the main branch vessels. Moderate stenosis distal basilar artery at the level of the superior cerebellar artery origins. Thready, irregular LEFT V4 segment flow related enhancement, poorly visualized distally. Moderate stenosis mid to distal RIGHT posterior cerebral artery. Bilateral posterior cerebral arteries are patent.  No large vessel occlusion, high-grade stenosis, aneurysm.  IMPRESSION: MRI HEAD: No acute intracranial process, specifically no acute ischemia.  Old RIGHT hemorrhagic posterior cerebral artery territory infarct. Additional areas of susceptibility artifact are progressed from prior examination suggesting sequelae of chronic hypertension, unlikely to represent hemorrhagic metastasis. Moderate advanced white matter changes most consistent chronic small vessel ischemic disease. Old small infarcts as described above.  New 8 x 13 mm mass LEFT parotid gland which  could represent intra parotid lymph node though considering somewhat suspicious imaging characteristics, recommend dedicated follow-up.  MRA HEAD: No large vessel occlusion. Thready, irregular LEFT vertebral artery with poor visualization distal V4 segment which can be seen with slow flow or, high-grade stenosis.  Moderate luminal irregularity/ stenosis of the anterior and posterior circulation consistent with atherosclerosis.   Electronically Signed   By: Elon Alas M.D.   On: 04/11/2015 23:06   Mr Jodene Nam Head/brain Wo Cm  04/11/2015   CLINICAL DATA:  Diplopia, worsening balance beginning yesterday at 3 p.m. History of stroke, diabetes, hypertension, breast cancer.  EXAM: MRI HEAD WITHOUT CONTRAST  MRA HEAD WITHOUT CONTRAST  TECHNIQUE: Multiplanar, multiecho pulse sequences of the brain and surrounding structures were obtained without intravenous contrast. Angiographic images of the head were obtained using MRA technique without contrast.  COMPARISON:  CT head April 11, 2015 at 1626 hours and MRI of the brain June 27, 2013  FINDINGS: MRI HEAD FINDINGS  No reduced diffusion to suggest acute ischemia. New subcentimeter focus of susceptibility artifact RIGHT pons, LEFT mesial temporal lobe, with additional scattered small foci of susceptibility artifact which were present previously. Old RIGHT occipital lobe hemorrhagic infarct. Ventricles and  sulci are normal for patient's age. Patchy supratentorial, pontine and RIGHT cerebellar white matter T2 hyperintensities are advanced from prior imaging. No midline shift or mass effect. Remote small RIGHT inferior cerebellar infarcts were present previously. Small rib remote RIGHT brachium pontis infarct. Remote RIGHT thalamus lacunar infarcts.  No abnormal extra-axial fluid collections. Status post bilateral ocular lens implants. Paranasal sinuses and mastoid air cells are well aerated. 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 No abnormal sellar expansion.  No cerebellar tonsillar ectopia. No suspicious calvarial bone marrow signal. Scattered scalp small sebaceous cysts. New 8 x 13 mm low signal mass LEFT parotid gland with reduced diffusion.  MRA HEAD FINDINGS  Anterior circulation: Normal flow related enhancement of the included cervical, petrous, cavernous and supra clinoid internal carotid arteries. Moderate symmetric stenosis of the proximal supraclinoid internal carotid arteries, likely due to atherosclerosis. Patent anterior communicating artery. Moderate stenosis LEFT A1 segment origin. Patent anterior communicating artery with normal flow related enhancement of the anterior cerebral arteries. Moderate luminal irregularity/stenosis LEFT M1 segment. Bilateral middle cerebral arteries are patent including mid to distal segments.  No large vessel occlusion, high-grade stenosis, aneurysm.  Posterior circulation: RIGHT vertebral artery is dominant. Basilar artery is patent, with normal flow related enhancement of the main branch vessels. Moderate stenosis distal basilar artery at the level of the superior cerebellar artery origins. Thready, irregular LEFT V4 segment flow related enhancement, poorly visualized distally. Moderate stenosis mid to distal RIGHT posterior cerebral artery. Bilateral posterior cerebral arteries are patent.  No large vessel occlusion, high-grade stenosis, aneurysm.  IMPRESSION: MRI HEAD: No acute intracranial process, specifically no acute ischemia.  Old RIGHT hemorrhagic posterior cerebral artery territory infarct. Additional areas of susceptibility artifact are progressed from prior examination suggesting sequelae of chronic hypertension, unlikely to represent hemorrhagic metastasis. Moderate advanced white matter changes most consistent chronic small vessel ischemic disease. Old small infarcts as described above.  New 8 x 13 mm mass LEFT parotid gland which could represent intra parotid lymph node though considering somewhat suspicious  imaging characteristics, recommend dedicated follow-up.  MRA HEAD: No large vessel occlusion. Thready, irregular LEFT vertebral artery with poor visualization distal V4 segment which can be seen with slow flow or, high-grade stenosis.  Moderate luminal irregularity/ stenosis of the anterior and posterior circulation consistent with atherosclerosis.   Electronically Signed   By: Elon Alas M.D.   On: 04/11/2015 23:06   Mr Cardiac Stress Test  04/15/2015   CLINICAL DATA:  64 year old female with h/o STEMI, s/p PCI to LAD and post chemotherapy. Evaluate for cardiomyopathy.  EXAM: CARDIAC MRI  TECHNIQUE: The patient was scanned on a 1.5 Tesla GE magnet. A dedicated cardiac coil was used. Functional imaging was done using Fiesta sequences. 2,3, and 4 chamber views were done to assess for RWMA's. Modified Simpson's rule using a short axis stack was used to calculate an ejection fraction on a dedicated work Conservation officer, nature. The patient received 22 cc of Multihance. After 10 minutes inversion recovery sequences were used to assess for infiltration and scar tissue.  Pharmacologic stress test was performed using regadenoson 0.4 mg iv. There was mild SOB that resolved within 5 minutes.  CONTRAST:  22 cc  of Multihance  FINDINGS: 1. Normal left ventricular size with moderate concentric hypertrophy and severely decreased systolic function (LVEF = 26%).  There is akinesis of the basal anterior, mid anterior, anteroseptal and apical anterior and septal walls.  LVEDD:  53 mm  LVESD:  50 mm  LVEDV:  145 ml  LVESV:  111 ml  SV:  34 mm  CO:  2.0 L/min  2. Normal right ventricular size, thickness and low normal systolic function (RVEF = 42%).  RVEDV:  90 ml  RVESV:  52 ml  SV:  38 ml  3.  Normal biatrial size.  4. Trivial mitral regurgitation.  5. There is perfusion defect at stress and at rest in the entire anterior, mid anteroseptal and apical septal walls. There is also mid inferoseptal perfusion defect at  stress.  6. There is 50-75 % late gadolinium in the basal anterior wall and 75-100 % transmural late gadolinium enhancement in the mid and apical anterior, mid anteroseptal and apical anterior walls.  IMPRESSION: 1. Normal left ventricular size with moderate concentric hypertrophy and severely decreased systolic function (LVEF = 26%). There is akinesis of the basal anterior, mid anterior, anteroseptal and apical anterior and septal walls.  2. Normal right ventricular size, thickness and low normal systolic function (RVEF = 42%).  3. Stress test indicates that there is a scar in the entire anterior, mid anteroseptal and apical septal walls with minimal ischemia in the mid inferoseptal wall.  4. Late gadolinium images show a scar a scar in the entire anterior, mid anteroseptal and apical septal walls with poor recovery if revascularized.  Collectively, these findings are showing that the degree of left ventricular systolic impairment are out of proportion to the infarct area and other causes such as hypertension and prior chemotherapy are most probably contributing to the degree of cardiomyopathy.  I would not recommend a cath but rather aggressive management of hypertension and treatment of systolic heart failure.  Ena Dawley   Electronically Signed   By: Ena Dawley   On: 04/15/2015 09:02    Microbiology: Recent Results (from the past 240 hour(s))  Urine culture     Status: None   Collection Time: 04/12/15 11:15 AM  Result Value Ref Range Status   Specimen Description URINE, RANDOM  Final   Special Requests NONE  Final   Colony Count   Final    >=100,000 COLONIES/ML Performed at [facility]    Culture   Final    ESCHERICHIA COLI Performed at [facility]    Report Status 04/14/2015 FINAL  Final   Organism ID, Bacteria ESCHERICHIA COLI  Final      Susceptibility   Escherichia coli - MIC*    AMPICILLIN <=2 SENSITIVE Sensitive     CEFAZOLIN <=4 SENSITIVE Sensitive      CEFTRIAXONE <=1 SENSITIVE Sensitive     CIPROFLOXACIN <=0.25 SENSITIVE Sensitive     GENTAMICIN <=1 SENSITIVE Sensitive     LEVOFLOXACIN <=0.12 SENSITIVE Sensitive     NITROFURANTOIN <=16 SENSITIVE Sensitive     TOBRAMYCIN <=1 SENSITIVE Sensitive     TRIMETH/SULFA <=20 SENSITIVE Sensitive     PIP/TAZO <=4 SENSITIVE Sensitive     * ESCHERICHIA COLI     Labs: Basic Metabolic Panel:  Recent Labs Lab 04/11/15 1610 04/11/15 1617 04/11/15 1930 04/12/15 0810 04/13/15 0554  NA 135 138  --  140 139  K 4.0 3.7  --  3.3* 3.9  CL 103 101  --  109 107  CO2 21*  --   --  24 24  GLUCOSE 390* 397*  --  160* 171*  BUN 14 14  --  13 8  CREATININE 0.50 0.30* 0.43* 0.45 0.41*  CALCIUM 9.4  --   --  9.0 8.9   Liver Function Tests:  Recent  Labs Lab 04/11/15 1610  AST 15  ALT 15  ALKPHOS 98  BILITOT 0.7  PROT 7.0  ALBUMIN 4.3   CBC:  Recent Labs Lab 04/11/15 1610  04/11/15 1930 04/12/15 0810 04/13/15 0554 04/14/15 0423 04/16/15 0424  WBC 4.0  --  4.3 4.7 5.1 3.7* 3.7*  NEUTROABS 2.6  --   --   --   --   --   --   HGB 17.3*  < > 17.3* 15.7* 15.7* 13.4 14.2  HCT 50.1*  < > 48.2* 46.3* 45.6 40.1 42.3  MCV 85.5  --  83.0 85.4 85.6 85.3 86.3  PLT 186  --  167 182 164 153 160  < > = values in this interval not displayed.  BNP (last 3 results)  Recent Labs  04/16/15 0424  BNP 68.9    CBG:  Recent Labs Lab 04/15/15 2001 04/15/15 2357 04/16/15 0401 04/16/15 0538 04/16/15 0802  GLUCAP 201* 258* 160* 146* 137*    Signed:  Barton Dubois  [facility] 04/16/2015, 11:13 AM

## 2015-04-20 ENCOUNTER — Encounter: Payer: Self-pay | Admitting: Internal Medicine

## 2015-04-20 ENCOUNTER — Ambulatory Visit (INDEPENDENT_AMBULATORY_CARE_PROVIDER_SITE_OTHER): Payer: BLUE CROSS/BLUE SHIELD | Admitting: Internal Medicine

## 2015-04-20 VITALS — BP 138/78 | HR 77 | Temp 98.6°F | Resp 16 | Wt 143.0 lb

## 2015-04-20 DIAGNOSIS — E1149 Type 2 diabetes mellitus with other diabetic neurological complication: Secondary | ICD-10-CM

## 2015-04-20 DIAGNOSIS — G459 Transient cerebral ischemic attack, unspecified: Secondary | ICD-10-CM | POA: Diagnosis not present

## 2015-04-20 DIAGNOSIS — R220 Localized swelling, mass and lump, head: Secondary | ICD-10-CM | POA: Diagnosis not present

## 2015-04-20 DIAGNOSIS — K118 Other diseases of salivary glands: Secondary | ICD-10-CM

## 2015-04-20 DIAGNOSIS — I10 Essential (primary) hypertension: Secondary | ICD-10-CM | POA: Diagnosis not present

## 2015-04-20 NOTE — Patient Instructions (Addendum)
  Minimal Blood Pressure Goal= AVERAGE < 140/90;  Ideal is an AVERAGE < 135/85. This AVERAGE should be calculated from @ least 5-7 BP readings taken @ different times of day on different days of week. You should not respond to isolated BP readings , but rather the AVERAGE for that week .Please bring your  blood pressure cuff to office visits to verify that it is reliable.It  can also be checked against the blood pressure device at the pharmacy. Finger or wrist cuffs are not dependable; an arm cuff is.  Your next office appointment should be in 6 weeks with Dr Ronnald Ramp.   A1c assesses average 24 hour  glucose over prior 6-12 weeks.  No Diabetes risk if < 6.1%  "Pre Diabetes" :6.2-6.4 % Good diabetic control: 6.5-7 % Fair diabetic control: 7-8 % Poor diabetic control: greater than 8 % ( except with additional factors such as  advanced age; significant coronary or neurologic disease,etc).  Your present value is 13.2  %. An  A1c of 8 % or less  is the safest goal for you.Preferred is < 7 % as long as there are no low blood glucose events.  Goals for home glucose monitoring are : fasting  or morning glucose goal of  100-150. Ninety minutes after any meal , goal = < 180, preferably < 160. Report any low blood glucoses immediately.Please bring your diary of glucose readings; blood pressure readings; & all actual prescription medications & supplements or updated correct list to your appointment.     The following nutritional changes may help prevent Diabetes progression & complications.  White carbohydrates (potatoes, rice, bread, and pasta) cause a high spike of the sugar level which stays elevated for a significant period of time (called sugar"load").  For example a  baked potato has a cup of sugar and a  french fry  2 teaspoons of sugar.  More complex carbs such as yams, wild  rice, whole grained bread &  wheat pasta have been much lower spike and persistent load of sugar than the white carbs.

## 2015-04-20 NOTE — Progress Notes (Signed)
   Subjective:    Patient ID: Marisa Forbes, female    DOB: [birth year], 64 y.o.   MRN: [id number]  HPI Her hospital records 6 12-6/17/16 were reviewed. She was admitted with new diplopia and worsening of gait dysfunction. She had a residual left visual field deficit and unstable gait related to prior embolic cerebrovascular accident.  In emergency room she was found a blood pressure of 227/119. She had discontinued her blood pressure medicines 6 months prior. Her glucose was 397; she stopped her Lantus and metformin 6 months or earlier as well. Her A1c proved to be 13.2% which would have over 160% increased risk.  She was discharged with diagnosis of TIA versus partial third nerve palsy. Additional findings include ejection fraction of 20-25 percent. Creatinine was 0.41.  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  Since discharge blood pressures range 150-160/90. She is on a low sugar, decreased fried foods, decreased red meats, and decreased sodium diet. She's not been checking fasting glucoses. 2 hours after a meal glucose was 313  Review of Systems She does have rare episodes of substernal chest pain lasting a second which is nonexertional, nonradiating, and not associated with diaphoresis or nausea. She does have polydipsia.  She's had no hypoglycemia. She has no numbness, tingling, weakness in extremities. She has no nonhealing skin lesions. She denies any polyuria, polyphagia.    Objective:   Physical Exam Pertinent or positive findings include: She has a side-to-side bobbing tremor the head. She has unsustained nystagmus with lateral gaze. She is wearing an external defibrillator. The second heart sound is increased.  General appearance :adequately nourished; in no distress.  Eyes: No conjunctival inflammation or scleral icterus is present.  Oral exam:  Lips and gums are healthy appearing.There is no oropharyngeal erythema or exudate noted. Dental hygiene is  good.  Heart:  Normal rate and regular rhythm. S1 normal without gallop, murmur, click, rub or other extra sounds    Lungs:Chest clear to auscultation; no wheezes, rhonchi,rales ,or rubs present.No increased work of breathing.   Abdomen: bowel sounds normal, soft and non-tender without masses, organomegaly or hernias noted.  No guarding or rebound.   Vascular : all pulses equal ; no bruits present.  Skin:Warm & dry.  Intact without suspicious lesions or rashes ; no tenting or jaundice   Lymphatic: No lymphadenopathy is noted about the head, neck, axilla.   Neuro: Strength, tone slightly decreased       Assessment & Plan:  See Current Assessment & Plan in Problem List under specific Diagnosis

## 2015-04-20 NOTE — Progress Notes (Signed)
Pre visit review using our clinic review tool, if applicable. No additional management support is needed unless otherwise documented below in the visit note. 

## 2015-04-21 DIAGNOSIS — K118 Other diseases of salivary glands: Secondary | ICD-10-CM | POA: Insufficient documentation

## 2015-04-21 NOTE — Assessment & Plan Note (Signed)
BP & glucose control risks discussed

## 2015-04-21 NOTE — Assessment & Plan Note (Signed)
See AVS for nutrition & glucose monitor recommendations

## 2015-04-21 NOTE — Assessment & Plan Note (Signed)
ENT referral

## 2015-04-26 ENCOUNTER — Other Ambulatory Visit: Payer: Self-pay

## 2015-05-21 ENCOUNTER — Telehealth: Payer: Self-pay | Admitting: *Deleted

## 2015-05-21 ENCOUNTER — Ambulatory Visit (INDEPENDENT_AMBULATORY_CARE_PROVIDER_SITE_OTHER): Payer: BLUE CROSS/BLUE SHIELD | Admitting: Nurse Practitioner

## 2015-05-21 ENCOUNTER — Encounter: Payer: Self-pay | Admitting: Nurse Practitioner

## 2015-05-21 VITALS — BP 160/90 | HR 73 | Ht 65.0 in | Wt 145.4 lb

## 2015-05-21 DIAGNOSIS — I2510 Atherosclerotic heart disease of native coronary artery without angina pectoris: Secondary | ICD-10-CM | POA: Diagnosis not present

## 2015-05-21 DIAGNOSIS — I5022 Chronic systolic (congestive) heart failure: Secondary | ICD-10-CM

## 2015-05-21 DIAGNOSIS — I10 Essential (primary) hypertension: Secondary | ICD-10-CM

## 2015-05-21 DIAGNOSIS — R300 Dysuria: Secondary | ICD-10-CM

## 2015-05-21 DIAGNOSIS — I519 Heart disease, unspecified: Secondary | ICD-10-CM | POA: Diagnosis not present

## 2015-05-21 LAB — BASIC METABOLIC PANEL
BUN: 10 mg/dL (ref 6–23)
CO2: 28 mEq/L (ref 19–32)
Calcium: 9.6 mg/dL (ref 8.4–10.5)
Chloride: 101 mEq/L (ref 96–112)
Creatinine, Ser: 0.65 mg/dL (ref 0.40–1.20)
GFR: 97.4 mL/min (ref >60.00)
Glucose, Bld: 425 mg/dL — ABNORMAL HIGH (ref 70–99)
Potassium: 4 mEq/L (ref 3.5–5.1)
Sodium: 136 mEq/L (ref 135–145)

## 2015-05-21 LAB — CBC
HCT: 44 % (ref 36.0–46.0)
Hemoglobin: 14.7 g/dL (ref 12.0–15.0)
MCHC: 33.3 g/dL (ref 30.0–36.0)
MCV: 87.3 fl (ref 78.0–100.0)
Platelets: 168 10*3/uL (ref 150.0–400.0)
RBC: 5.04 Mil/uL (ref 3.87–5.11)
RDW: 14.2 % (ref 11.5–15.5)
WBC: 5.1 10*3/uL (ref 4.0–10.5)

## 2015-05-21 LAB — URINALYSIS
Bilirubin Urine: NEGATIVE
Hgb urine dipstick: NEGATIVE
Ketones, ur: NEGATIVE
Leukocytes, UA: NEGATIVE
Nitrite: NEGATIVE
Specific Gravity, Urine: <=1.005 — AB (ref 1.000–1.030)
Total Protein, Urine: NEGATIVE
Urine Glucose: >=1000 — AB
Urobilinogen, UA: 0.2 (ref 0.0–1.0)
pH: 5.5 (ref 5.0–8.0)

## 2015-05-21 MED ORDER — HYDRALAZINE HCL 25 MG PO TABS: 25.0000 mg | ORAL_TABLET | Freq: Three times a day (TID) | ORAL | Status: DC

## 2015-05-21 NOTE — Progress Notes (Signed)
CARDIOLOGY OFFICE NOTE  Date:  05/21/2015    [name] Date of Birth: 1951/01/18 Medical Record #[id number]  PCP:  Scarlette Calico, MD  Cardiologist:  Harrington Challenger    Chief Complaint  Patient presents with  . Post hospital for TIA, HTN, systolic HF    Seen for Dr. Harrington Challenger    History of Present Illness: Marisa Forbes is a 64 y.o. female who presents today for a post hospital visit. Seen for Dr. Harrington Challenger. 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  Presented last month with visual and balance issues. CT of the head showing no evidence of acute intracranial finding, old infarct and chronic microvascular disease. Noticed to have uncontrolled blood pressure 227/119, she had stopped taking all antihypertensives medication before 6 months without consulting her doctor, as she thought medications were not helping, blood glucose uncontrolled as well with level of 397, for which she stopped taking her metformin and Lantus 6 months ago as well.  Her hospital course entailed getting an echo - EF down to 20 to 25%. ? Of brainstem TIA from small vessel disease vs partial 3rd nerve palsy. Medicines were restarted. BP controlled. Found to have a UTI which was treated with antibiotics. Large parotid mass noted and she was to follow with PCP. Diabetes was uncontrolled.   Comes in today. Here alone. She says she is doing well. No problems. No chest pain. Not short of breath. BP remains high. Has her Life Vest on - no problems noted.  She tells me that she was to see Dr. Gwenlyn Found - last fall - but her appointment was moved "so many times". Now asking about what her renal duplex even showed and what we will be doing about that. She had an >60% left renal artery stenosis which had progressed since 2013. Right kidney 3.83 cm smaller than her left. She is taking her medicines. Sugar "up  and down". Has appointment with ENT and PCP on August 2nd - she lives 50 miles away.   Past Medical History  Diagnosis Date  . HTN (hypertension)   . Diabetes mellitus without complication   . Myocardial infarction 09/07/12  . Breast cancer 12/03/12    a. 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 S/p chemo with CMF and radiation.  Marland Kitchen History of radiation therapy 06/23/2013-08/08/2013    62.4 gray to left breast  . Hypercholesteremia   . Coronary artery disease     a. STEMI 08/2012: s/p DES to LAD, PTCA to downstream LAD. b. Relook cath same hospitalization: stable post-PCI anatomy with Stable Diag lesions (unlikely cause of resting angina, not good PCI targets), stable RCA lesion (non flow limiting).  . Stroke, embolic     a. 16/0109 post cath.  . Peripheral vision loss     left  . Balance problems     due to stroke  . History of cancer chemotherapy     last in August 2014  . GERD (gastroesophageal reflux disease)     food related  . Arthritis     back  . Renal artery stenosis     a. 20% L RAS in 08/2012 by cath. b. Duplex 07/2012: >60% L RAS.  . LV dysfunction     a. EF 45-50% at time of STEMI 08/2012. b. Echo 04/2013: EF 45-50%, mild focal basal hypertrophy  of septum, grade 1 d/d.  Marland Kitchen Vasovagal syncope     a. 04/2013 - echo stable compared to prior EF 45-50%, negative carotid duplex.    Past Surgical History  Procedure Laterality Date  . Coronary angioplasty with stent placement    . Breast lumpectomy with needle localization and axillary sentinel lymph node bx Left 01/08/2013    Procedure: LEFT BREAST WIRE GUIDED  LUMPECTOMY AND LEFT AXILLARY SENTINEL  NODE BX;  Surgeon: Rolm Bookbinder, MD;  Location: [facility];  Service: General;  Laterality: Left;  . Portacath placement Right 02/17/2013    Procedure: INSERTION PORT-A-CATH;  Surgeon: Rolm Bookbinder, MD;  Location: [facility];  Service: General;  Laterality: Right;  . Back surgery      x3, lower back  .  Cataract extraction Bilateral   . Port-a-cath removal N/A 10/14/2013    Procedure: REMOVAL PORT-A-CATH;  Surgeon: Rolm Bookbinder, MD;  Location: [facility];  Service: General;  Laterality: N/A;  . Left heart catheterization with coronary angiogram N/A 09/07/2012    Procedure: LEFT HEART CATHETERIZATION WITH CORONARY ANGIOGRAM;  Surgeon: Troy Sine, MD;  Location: [facility] CATH LAB;  Service: Cardiovascular;  Laterality: N/A;  . Percutaneous coronary stent intervention (pci-s) N/A 09/07/2012    Procedure: PERCUTANEOUS CORONARY STENT INTERVENTION (PCI-S);  Surgeon: Troy Sine, MD;  Location: [facility] CATH LAB;  Service: Cardiovascular;  Laterality: N/A;  . Left heart catheterization with coronary angiogram  09/09/2012    Procedure: LEFT HEART CATHETERIZATION WITH CORONARY ANGIOGRAM;  Surgeon: Leonie Man, MD;  Location: [facility] CATH LAB;  Service: Cardiovascular;;     Medications: Current Outpatient Prescriptions  Medication Sig Dispense Refill  . aspirin 81 MG EC tablet Take 1 tablet (81 mg total) by mouth daily. 30 tablet 12  . atorvastatin (LIPITOR) 80 MG tablet Take 1 tablet (80 mg total) by mouth daily with supper. 90 tablet 3  . carvedilol (COREG) 25 MG tablet Take 1 tablet (25 mg total) by mouth 2 (two) times daily with a meal. 60 tablet 1  . cefUROXime (CEFTIN) 500 MG tablet Take 1 tablet (500 mg total) by mouth 2 (two) times daily with a meal. 8 tablet 0  . glucose blood (ONETOUCH VERIO) test strip Use TID 100 each 12  . HUMALOG KWIKPEN 100 UNIT/ML KiwkPen INJECT 12 UNITS INTO THE SKIN TID WITH MEALS  1  . hydrALAZINE (APRESOLINE) 10 MG tablet Take 1 tablet (10 mg total) by mouth 2 (two) times daily. 60 tablet 2  . Insulin Glargine (LANTUS SOLOSTAR) 100 UNIT/ML Solostar Pen Inject 18 units Skyline-Ganipa at bedtime. 1 pen 3  . isosorbide mononitrate (IMDUR) 60 MG 24 hr tablet Take 1 tablet (60 mg total) by mouth daily. 90 tablet 3  . lisinopril (PRINIVIL,ZESTRIL) 40 MG tablet Take 1 tablet (40 mg total)  by mouth daily with breakfast. 90 tablet 3  . metFORMIN (GLUCOPHAGE XR) 750 MG 24 hr tablet Take 1 tablet (750 mg total) by mouth daily with breakfast. 30 tablet 1  . nitroGLYCERIN (NITROSTAT) 0.4 MG SL tablet Place 0.4 mg under the tongue every 5 (five) minutes as needed for chest pain.     No current facility-administered medications for this visit.    Allergies: No Known Allergies  Social History: The patient  reports that she has never smoked. She has never used smokeless tobacco. She reports that she does not drink alcohol or use illicit drugs.   Family History: The patient's family history is negative for Cancer, Diabetes, Hyperlipidemia, Hypertension,  Kidney disease, and Stroke.   Review of Systems: Please see the history of present illness.   Otherwise, the review of systems is positive for none.   All other systems are reviewed and negative.   Physical Exam: VS:  BP 160/90 mmHg  Pulse 73  Ht 5\' 5"  (1.651 m)  Wt 145 lb 6.4 oz (65.953 kg)  BMI 24.20 kg/m2  SpO2 99% .  BMI Body mass index is 24.2 kg/(m^2).  Wt Readings from Last 3 Encounters:  05/21/15 145 lb 6.4 oz (65.953 kg)  04/20/15 143 lb (64.864 kg)  04/14/15 141 lb 15.6 oz (64.4 kg)    General: Pleasant. Well developed, well nourished and in no acute distress.  HEENT: Normal. Neck: Supple, no JVD, carotid bruits, or masses noted.  Cardiac: Regular rate and rhythm. No murmurs, rubs, or gallops. No edema.  Respiratory:  Lungs are clear to auscultation bilaterally with normal work of breathing.  GI: Soft and nontender.  MS: No deformity or atrophy. Gait and ROM intact. Skin: Warm and dry. Color is normal.  Neuro:  Strength and sensation are intact and no gross focal deficits noted.  Psych: Alert, appropriate and with normal affect.   LABORATORY DATA:  EKG:  EKG is not ordered today.   Lab Results  Component Value Date   WBC 3.7* 04/16/2015   HGB 14.2 04/16/2015   HCT 42.3 04/16/2015   PLT 160  04/16/2015   GLUCOSE 171* 04/13/2015   CHOL 208* 04/12/2015   TRIG 95 04/12/2015   HDL 50 04/12/2015   LDLCALC 139* 04/12/2015   ALT 15 04/11/2015   AST 15 04/11/2015   NA 139 04/13/2015   K 3.9 04/13/2015   CL 107 04/13/2015   CREATININE 0.41* 04/13/2015   BUN 8 04/13/2015   CO2 24 04/13/2015   TSH 0.28* 08/14/2014   INR 1.66* 04/11/2015   HGBA1C 13.2* 04/12/2015    BNP (last 3 results)  Recent Labs  04/16/15 0424  BNP 68.9    ProBNP (last 3 results) No results for input(s): PROBNP in the last 8760 hours.   Other Studies Reviewed Today:  ECHO: - Left ventricle: The cavity size was normal. There was moderate concentric hypertrophy. Systolic function was severely reduced. The estimated ejection fraction was in the range of 25% to 30%. Diffuse hypokinesis. There is akinesis of the anterior myocardium. Acoustic contrast opacification revealed no evidence ofthrombus.  Impressions: - No cardiac source of emboli was indentified.   Carotid doppler: Findings suggest 1-39% internal carotid artery stenosis bilaterally. Vertebral arteries are patent with antegrade flow.   MR cardiac stress test: Per cardiology the findings are showing that the degree of left ventricular systolic impairment are out of proportion to the infarct area and other causes such as hypertension and prior chemotherapy are most probably contributing to the degree of cardiomyopathy.   CARDIAC MR IMPRESSION: 1. Normal left ventricular size with moderate concentric hypertrophy and severely decreased systolic function (LVEF = 26%). There is akinesis of the basal anterior, mid anterior, anteroseptal and apical anterior and septal walls.  2. Normal right ventricular size, thickness and low normal systolic function (RVEF = 42%).  3. Stress test indicates that there is a scar in the entire anterior, mid anteroseptal and apical septal walls with minimal ischemia in the mid inferoseptal  wall.  4. Late gadolinium images show a scar a scar in the entire anterior, mid anteroseptal and apical septal walls with poor recovery if revascularized.  Collectively, these findings are showing  that the degree of left ventricular systolic impairment are out of proportion to the infarct area and other causes such as hypertension and prior chemotherapy are most probably contributing to the degree of cardiomyopathy.  I would not recommend a cath but rather aggressive management of hypertension and treatment of systolic heart failure.  Ena Dawley   Electronically Signed  By: Ena Dawley  On: 04/15/2015 09:02  Assessment/Plan:   1. Possible brainstem TIA from small vessel diseases vs Partial 3rd nerve palsy  -Diplopia continue improving   2. Systolic heart failure/cardiomyopathy -EF found to be 20-25% - this is likely due to medication non-compliance and diet indiscretion -Life Vest in place -currently with stable volume status. Will need repeat echo in 3 months (after 07/14/15)  to reassess for possible ICD implant.   3. HTN, malignant;  -increasing Hydralazine to 25 mg TID  4. Diabetes mellitus, uncontrolled. -HbA1c 13.2  - to see PCP  5. New large L parotid mass 8x16mm - seeing PCP   6. Coronary artery disease; continue with aspirin, metoprolol, statins and nitrates.  -patient with hx of medication non-compliance  -S/P MR cardiac stress test, results demonstrated no need for catheterization at this moment -she has no symptoms  7. HLD -continue statins  8. Renal artery stenosis - will refer her to [facility] to see if further testing/treatment is warranted.   Current medicines are reviewed with the patient today.  The patient does not have concerns regarding medicines other than what has been noted above.  The following changes have been made:  See above.  Labs/ tests ordered today include:   No orders of the defined types were placed in this  encounter.     Disposition:   FU with me in one month and see Dr. Harrington Challenger in September.  Patient is agreeable to this plan and will call if any problems develop in the interim.   Signed: Burtis Junes, RN, ANP-C 05/21/2015 8:23 AM  [facility] [address] [place], [place]  [address] Phone: [phone] Fax: [phone]

## 2015-05-21 NOTE — Patient Instructions (Addendum)
We will be checking the following labs today - BMET, CBC, UA and urine culture    Medication Instructions:    Continue with your current medicines but I am   Stopping your Hydralazine  Starting Hydralazine 25 mg to take three times a day - this is at your drug store    Testing/Procedures To Be Arranged:  N/A  Follow-Up:   See Dr. Harrington Challenger in one month  Referral to [facility] - Dr. Oneida Alar, Dr. Trula Slade or Dr. Scot Dock - I will be calling them today.  Other Special Instructions:   Weigh daily  Restrict salt  Monitor BP at home  Call the [facility] office at [phone] if you have any questions, problems or concerns.

## 2015-05-21 NOTE — Telephone Encounter (Signed)
Lm per pt to let pt know August 2 @ 11:30 will have appointment at [facility] with Dr.Early

## 2015-05-24 ENCOUNTER — Telehealth: Payer: Self-pay | Admitting: Nurse Practitioner

## 2015-05-24 LAB — URINE CULTURE: Colony Count: >=100000

## 2015-05-24 NOTE — Telephone Encounter (Signed)
Follow Up       Pt returning Danielle's phone call.

## 2015-05-24 NOTE — Telephone Encounter (Signed)
Follow up    Pt returning Danielle's call

## 2015-05-25 ENCOUNTER — Other Ambulatory Visit: Payer: Self-pay | Admitting: *Deleted

## 2015-05-25 MED ORDER — CEFUROXIME AXETIL 500 MG PO TABS: 500.0000 mg | ORAL_TABLET | Freq: Two times a day (BID) | ORAL | Status: DC

## 2015-05-25 NOTE — Telephone Encounter (Signed)
Follow up      Returning Danielle's call

## 2015-05-27 ENCOUNTER — Encounter: Payer: Self-pay | Admitting: Vascular Surgery

## 2015-06-01 ENCOUNTER — Encounter: Payer: Self-pay | Admitting: Internal Medicine

## 2015-06-01 ENCOUNTER — Ambulatory Visit (INDEPENDENT_AMBULATORY_CARE_PROVIDER_SITE_OTHER): Payer: BLUE CROSS/BLUE SHIELD | Admitting: Vascular Surgery

## 2015-06-01 ENCOUNTER — Encounter: Payer: Self-pay | Admitting: Vascular Surgery

## 2015-06-01 ENCOUNTER — Encounter: Payer: Self-pay | Admitting: Nurse Practitioner

## 2015-06-01 ENCOUNTER — Ambulatory Visit (INDEPENDENT_AMBULATORY_CARE_PROVIDER_SITE_OTHER): Payer: BLUE CROSS/BLUE SHIELD | Admitting: Nurse Practitioner

## 2015-06-01 ENCOUNTER — Ambulatory Visit (INDEPENDENT_AMBULATORY_CARE_PROVIDER_SITE_OTHER): Payer: BLUE CROSS/BLUE SHIELD | Admitting: Internal Medicine

## 2015-06-01 ENCOUNTER — Other Ambulatory Visit (INDEPENDENT_AMBULATORY_CARE_PROVIDER_SITE_OTHER): Payer: BLUE CROSS/BLUE SHIELD

## 2015-06-01 ENCOUNTER — Other Ambulatory Visit: Payer: BLUE CROSS/BLUE SHIELD

## 2015-06-01 VITALS — BP 155/88 | HR 76 | Temp 97.5°F | Resp 18 | Ht 65.0 in | Wt 146.5 lb

## 2015-06-01 VITALS — BP 188/90 | HR 91 | Ht 65.0 in | Wt 147.8 lb

## 2015-06-01 VITALS — BP 140/88 | HR 72 | Temp 98.5°F | Resp 16 | Ht 65.0 in | Wt 147.0 lb

## 2015-06-01 DIAGNOSIS — I701 Atherosclerosis of renal artery: Secondary | ICD-10-CM

## 2015-06-01 DIAGNOSIS — I2510 Atherosclerotic heart disease of native coronary artery without angina pectoris: Secondary | ICD-10-CM | POA: Diagnosis not present

## 2015-06-01 DIAGNOSIS — E785 Hyperlipidemia, unspecified: Secondary | ICD-10-CM

## 2015-06-01 DIAGNOSIS — I2583 Coronary atherosclerosis due to lipid rich plaque: Principal | ICD-10-CM

## 2015-06-01 DIAGNOSIS — E1149 Type 2 diabetes mellitus with other diabetic neurological complication: Secondary | ICD-10-CM

## 2015-06-01 DIAGNOSIS — Z23 Encounter for immunization: Secondary | ICD-10-CM

## 2015-06-01 DIAGNOSIS — I5022 Chronic systolic (congestive) heart failure: Secondary | ICD-10-CM | POA: Diagnosis not present

## 2015-06-01 DIAGNOSIS — I10 Essential (primary) hypertension: Secondary | ICD-10-CM

## 2015-06-01 DIAGNOSIS — Z1211 Encounter for screening for malignant neoplasm of colon: Secondary | ICD-10-CM

## 2015-06-01 LAB — BASIC METABOLIC PANEL
BUN: 14 mg/dL (ref 6–23)
CO2: 29 mEq/L (ref 19–32)
Calcium: 9.8 mg/dL (ref 8.4–10.5)
Chloride: 103 mEq/L (ref 96–112)
Creatinine, Ser: 0.66 mg/dL (ref 0.40–1.20)
GFR: 95.69 mL/min (ref >60.00)
Glucose, Bld: 329 mg/dL — ABNORMAL HIGH (ref 70–99)
Potassium: 4.2 mEq/L (ref 3.5–5.1)
Sodium: 138 mEq/L (ref 135–145)

## 2015-06-01 LAB — URINALYSIS, ROUTINE W REFLEX MICROSCOPIC
Bilirubin Urine: NEGATIVE
Hgb urine dipstick: NEGATIVE
Ketones, ur: NEGATIVE
Leukocytes, UA: NEGATIVE
Nitrite: NEGATIVE
RBC / HPF: NONE SEEN (ref 0–?)
Specific Gravity, Urine: 1.01 (ref 1.000–1.030)
Total Protein, Urine: NEGATIVE
Urine Glucose: >=1000 — AB
Urobilinogen, UA: 0.2 (ref 0.0–1.0)
pH: 5.5 (ref 5.0–8.0)

## 2015-06-01 LAB — LIPID PANEL
Cholesterol: 139 mg/dL (ref 0–200)
HDL: 58.5 mg/dL (ref >39.00)
LDL Cholesterol: 63 mg/dL (ref 0–99)
NonHDL: 80.33
Total CHOL/HDL Ratio: 2
Triglycerides: 86 mg/dL (ref 0.0–149.0)
VLDL: 17.2 mg/dL (ref 0.0–40.0)

## 2015-06-01 LAB — MICROALBUMIN / CREATININE URINE RATIO
Creatinine,U: 34.4 mg/dL
Microalb Creat Ratio: 4.6 mg/g (ref 0.0–30.0)
Microalb, Ur: 1.6 mg/dL (ref 0.0–1.9)

## 2015-06-01 LAB — HEMOGLOBIN A1C: Hgb A1c MFr Bld: 10.8 % — ABNORMAL HIGH (ref 4.6–6.5)

## 2015-06-01 MED ORDER — HYDRALAZINE HCL 50 MG PO TABS: 50.0000 mg | ORAL_TABLET | Freq: Three times a day (TID) | ORAL | Status: DC

## 2015-06-01 MED ORDER — CANAGLIFLOZIN 100 MG PO TABS: 100.0000 mg | ORAL_TABLET | Freq: Every day | ORAL | Status: DC

## 2015-06-01 MED ORDER — METFORMIN HCL ER 750 MG PO TB24: 1500.0000 mg | ORAL_TABLET | Freq: Every day | ORAL | Status: DC

## 2015-06-01 NOTE — Progress Notes (Deleted)
Problems with Activities of Daily Living Secondary to Leg Pain  1. ***  2. ***  3. ***   Failure of  Conservative Therapy:  1. Worn 20-30 mm Hg thigh high compression hose >3 months with no relief of symptoms.  2. Frequently elevates legs-no relief of symptoms  3. Taken Ibuprofen 600 Mg TID with no relief of symptoms.

## 2015-06-01 NOTE — Progress Notes (Signed)
Patient name: Marisa Forbes MRN: [id number] DOB: 07-04-1951 Sex: female   Referred by: Truitt Merle  Reason for referral:  Chief Complaint  Patient presents with  . New Evaluation    renal artery duplex in October 2015  referred by Truitt Merle    HISTORY OF PRESENT ILLNESS: Patient is seen today to discuss left renal artery stenosis possibly causing renal vascular hypertension. She has a long standing history of poorly controlled hypertension. Apparently she is also had some difficulty with compliance with her antihypertensives regimen. She does have a prior duplex showing greater than 60% left renal artery stenosis. She has 14 cm left kidney and a 10 cm right kidney. No evidence of right renal artery stenosis. Her renal function is normal with a creatinine of 0.65  She is tearful today having had a recent diagnosis of a parotid mass. She is seeing ENT evaluation later today. Does have a prior history of breast cancer. She also has significant cardiac difficulty and is wearing a external defibrillator currently all she is having further workup of low ejection fraction from a cardiac standpoint. No prior history of  peripheral vascular disease. Did have an embolic stroke following cardiac catheterization  Past Medical History  Diagnosis Date  . HTN (hypertension)   . Diabetes mellitus without complication   . Myocardial infarction 09/07/12  . Breast cancer 12/03/12    a. 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 S/p chemo with CMF and radiation.  Marland Kitchen History of radiation therapy 06/23/2013-08/08/2013    62.4 gray to left breast  . Hypercholesteremia   . Coronary artery disease     a. STEMI 08/2012: s/p DES to LAD, PTCA to downstream LAD. b. Relook cath same hospitalization: stable post-PCI anatomy with Stable Diag lesions (unlikely cause of resting angina, not good PCI targets), stable RCA lesion (non flow limiting).  . Stroke, embolic     a. 69/7948  post cath.  . Peripheral vision loss     left  . Balance problems     due to stroke  . History of cancer chemotherapy     last in August 2014  . GERD (gastroesophageal reflux disease)     food related  . Arthritis     back  . Renal artery stenosis     a. 20% L RAS in 08/2012 by cath. b. Duplex 07/2012: >60% L RAS.  . LV dysfunction     a. EF 45-50% at time of STEMI 08/2012. b. Echo 04/2013: EF 45-50%, mild focal basal hypertrophy of septum, grade 1 d/d.  Marland Kitchen Vasovagal syncope     a. 04/2013 - echo stable compared to prior EF 45-50%, negative carotid duplex.    Past Surgical History  Procedure Laterality Date  . Coronary angioplasty with stent placement    . Breast lumpectomy with needle localization and axillary sentinel lymph node bx Left 01/08/2013    Procedure: LEFT BREAST WIRE GUIDED  LUMPECTOMY AND LEFT AXILLARY SENTINEL  NODE BX;  Surgeon: Rolm Bookbinder, MD;  Location: [place];  Service: General;  Laterality: Left;  . Portacath placement Right 02/17/2013    Procedure: INSERTION PORT-A-CATH;  Surgeon: Rolm Bookbinder, MD;  Location: [place];  Service: General;  Laterality: Right;  . Back surgery      x3, lower back  . Cataract extraction Bilateral   . Port-a-cath removal N/A 10/14/2013    Procedure: REMOVAL PORT-A-CATH;  Surgeon: Rolm Bookbinder, MD;  Location: [facility];  Service: General;  Laterality: N/A;  . Left heart catheterization with coronary angiogram N/A 09/07/2012    Procedure: LEFT HEART CATHETERIZATION WITH CORONARY ANGIOGRAM;  Surgeon: Troy Sine, MD;  Location: [facility] CATH LAB;  Service: Cardiovascular;  Laterality: N/A;  . Percutaneous coronary stent intervention (pci-s) N/A 09/07/2012    Procedure: PERCUTANEOUS CORONARY STENT INTERVENTION (PCI-S);  Surgeon: Troy Sine, MD;  Location: [facility] CATH LAB;  Service: Cardiovascular;  Laterality: N/A;  . Left heart catheterization with coronary angiogram  09/09/2012    Procedure: LEFT HEART CATHETERIZATION WITH CORONARY  ANGIOGRAM;  Surgeon: Leonie Man, MD;  Location: [facility] CATH LAB;  Service: Cardiovascular;;    History   Social History  . Marital Status: Married    Spouse Name: [name]  . Number of Children: 2  . Years of Education: college   Occupational History  .     Social History Main Topics  . Smoking status: Never Smoker   . Smokeless tobacco: Never Used  . Alcohol Use: No  . Drug Use: No  . Sexual Activity: Yes   Other Topics Concern  . Not on file   Social History Narrative   Patient is out of work on medical leave and lives at home with husband.    Patient has 2 children and some college education.              Family History  Problem Relation Age of Onset  . Cancer Neg Hx   . Diabetes Neg Hx   . Hyperlipidemia Neg Hx   . Hypertension Neg Hx   . Kidney disease Neg Hx   . Stroke Neg Hx     Allergies as of 06/01/2015  . (No Known Allergies)    Current Outpatient Prescriptions on File Prior to Visit  Medication Sig Dispense Refill  . aspirin 81 MG EC tablet Take 1 tablet (81 mg total) by mouth daily. 30 tablet 12  . atorvastatin (LIPITOR) 80 MG tablet Take 1 tablet (80 mg total) by mouth daily with supper. 90 tablet 3  . canagliflozin (INVOKANA) 100 MG TABS tablet Take 1 tablet (100 mg total) by mouth daily. 100 tablet 0  . carvedilol (COREG) 25 MG tablet Take 1 tablet (25 mg total) by mouth 2 (two) times daily with a meal. 60 tablet 1  . glucose blood (ONETOUCH VERIO) test strip Use TID 100 each 12  . HUMALOG KWIKPEN 100 UNIT/ML KiwkPen INJECT 12 UNITS INTO THE SKIN TID WITH MEALS  1  . hydrALAZINE (APRESOLINE) 10 MG tablet TK 1 T PO BID  2  . hydrALAZINE (APRESOLINE) 25 MG tablet Take 1 tablet (25 mg total) by mouth 3 (three) times daily. 90 tablet 6  . Insulin Glargine (LANTUS SOLOSTAR) 100 UNIT/ML Solostar Pen Inject 18 units [place] at bedtime. 1 pen 3  . isosorbide mononitrate (IMDUR) 60 MG 24 hr tablet Take 1 tablet (60 mg total) by mouth daily. 90 tablet 3  .  lisinopril (PRINIVIL,ZESTRIL) 40 MG tablet Take 1 tablet (40 mg total) by mouth daily with breakfast. 90 tablet 3  . metFORMIN (GLUCOPHAGE XR) 750 MG 24 hr tablet Take 2 tablets (1,500 mg total) by mouth daily with breakfast. 180 tablet 1  . nitroGLYCERIN (NITROSTAT) 0.4 MG SL tablet Place 0.4 mg under the tongue every 5 (five) minutes as needed for chest pain.     No current facility-administered medications on file prior to visit.     REVIEW OF SYSTEMS:  Positives indicated with an "  X"  CARDIOVASCULAR:  [ ]  chest pain   [ ]  chest pressure   [ ]  palpitations   [ ]  orthopnea   [ ]  dyspnea on exertion   [ ]  claudication   [ ]  rest pain   [ ]  DVT   [ ]  phlebitis PULMONARY:   [ ]  productive cough   [ ]  asthma   [ ]  wheezing NEUROLOGIC:   [ ]  weakness  [ ]  paresthesias  [ ]  aphasia  [ x] amaurosis  [ ]  dizziness HEMATOLOGIC:   [ ]  bleeding problems   [ ]  clotting disorders MUSCULOSKELETAL:  [ ]  joint pain   [ ]  joint swelling GASTROINTESTINAL: [ ]   blood in stool  [ ]   hematemesis GENITOURINARY:  [ ]   dysuria  [ ]   hematuria PSYCHIATRIC:  [ ]  history of major depression INTEGUMENTARY:  [ ]  rashes  [ ]  ulcers CONSTITUTIONAL:  [ ]  fever   [ ]  chills  PHYSICAL EXAMINATION:  General: The patient is a well-nourished female, in no acute distress. Vital signs are BP 155/88 mmHg  Pulse 76  Temp(Src) 97.5 F (36.4 C) (Oral)  Resp 18  Ht 5\' 5"  (1.651 m)  Wt 146 lb 8 oz (66.452 kg)  BMI 24.38 kg/m2  SpO2 98% Pulmonary: There is a good air exchange bilaterally without wheezing or rales. Abdomen: Soft and non-tender with normal pitch bowel sounds. No abdominal bruit present Musculoskeletal: There are no major deformities.  There is no significant extremity pain. Neurologic: No focal weakness or paresthesias are detected, Skin: There are no ulcer or rashes noted. Psychiatric: The patient has normal affect. Cardiovascular: There is a regular rate and rhythm without significant murmur  appreciated. 2+ radial and 2+ dorsalis pedis pulses bilaterally   Vascular Lab Studies:  October 2015 suggested greater than 60% left renal artery stenosis. No right renal artery stenosis.  Impression and Plan:  Had long discussion with patient regarding potential renovascular hypertension. Explained the ultrasound did suggest a high-grade stenosis of her left renal artery. Explained that this may be contributing to her severe hypertension but that this could also be essential hypertension with no correlation. I have recommended arteriography further evaluation and would recommend angioplasty and stenting if she has a high-grade left renal artery stenosis. I would certainly defer this until she has had a workup of her parotid lesion and clearance of her cardiac status. We will continue to communicate with her and plan outpatient arteriography and possible left renal arteries intervention    Cayle Cordoba [facility] [place] Office: [phone]

## 2015-06-01 NOTE — Progress Notes (Signed)
Pre visit review using our clinic review tool, if applicable. No additional management support is needed unless otherwise documented below in the visit note. 

## 2015-06-01 NOTE — Patient Instructions (Signed)

## 2015-06-01 NOTE — Patient Instructions (Addendum)
We will be checking the following labs today - NONE   Medication Instructions:    Continue with your current medicines but  I am increasing the Hydralazine to 50 mg three times a day - this is at your drug store.    Testing/Procedures To Be Arranged:  Echo after 07/14/2015  Follow-Up:   See Dr. Harrington Challenger in September   Other Special Instructions:   N/A  Call the [place] office at [phone] if you have any questions, problems or concerns.

## 2015-06-01 NOTE — Progress Notes (Signed)
Filed Vitals:   06/01/15 1123 06/01/15 1126  BP: 141/85 155/88  Pulse: 76   Temp: 97.5 F (36.4 C)   TempSrc: Oral   Resp: 18   Height: 5\' 5"  (1.651 m)   Weight: 146 lb 8 oz (66.452 kg)   SpO2: 98%

## 2015-06-01 NOTE — Progress Notes (Signed)
CARDIOLOGY OFFICE NOTE  Date:  06/01/2015    Marisa Forbes Date of Birth: 12-15-1950 Medical Record #[id number]  PCP:  Scarlette Calico, MD  Cardiologist:  Harrington Challenger    Chief Complaint  Patient presents with  . Cardiomyopathy    Follow up visit - seen for Dr. Harrington Challenger    History of Present Illness: Marisa Forbes is a 64 y.o. female who presents today for a follow up visit. Seen for Dr. Harrington Challenger.   She has a medical history of breast cancer, hypertension, uncontrolled diabetes mellitus, coronary artery disease with prior PCI to the LAD in 1638, and embolic CVA in the past.  Presented in June with visual and balance issues. CT of the head showing no evidence of acute intracranial finding, old infarct and chronic microvascular disease. Noticed to have uncontrolled blood pressure 227/119, she had stopped taking all antihypertensives medication before 6 months without consulting her doctor, as she thought medications were not helping, blood glucose uncontrolled as well with level of 397, for which she stopped taking her metformin and Lantus 6 months ago as well.  Her hospital course entailed getting an echo - EF down to 20 to 25%. ? Of brainstem TIA from small vessel disease vs partial 3rd nerve palsy. Medicines were restarted. BP controlled. Found to have a UTI which was treated with antibiotics. Large parotid mass noted and she was to follow with PCP. Diabetes was uncontrolled.   I saw her back for her post hospital visit -was doing ok. Asking about her PV tests that were done back in the fall - I have sent her to [facility] for further evaluation. She was to see her PCP for her parotid mass. Cardiac status was stable. Increased to 25 Hydralazine at last visit.   Comes in today. Here alone. She has had a busy day. Has seen PCP - had her metformin increased. Saw ENT about the parotid mass - she has elected to go ahead with a ultrasound/biopsy. Saw Dr. Donnetta Hutching about RAS and will be having an arteriogram once  this parotid issue is defined. She is feeling ok. No chest pain. Breathing is good. 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 She has not taken her mid day medicine yet - forgot to bring with her due to all the visits that she had. BP typically around 140 to 150 at home.   Past Medical History  Diagnosis Date  . HTN (hypertension)   . Diabetes mellitus without complication   . Myocardial infarction 09/07/12  . Breast cancer 12/03/12    a. 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 S/p chemo with CMF and radiation.  Marland Kitchen History of radiation therapy 06/23/2013-08/08/2013    62.4 gray to left breast  . Hypercholesteremia   . Coronary artery disease     a. STEMI 08/2012: s/p DES to LAD, PTCA to downstream LAD. b. Relook cath same hospitalization: stable post-PCI anatomy with Stable Diag lesions (unlikely cause of resting angina, not good PCI targets), stable RCA lesion (non flow limiting).  . Stroke, embolic     a. 46/6599 post cath.  . Peripheral vision loss     left  . Balance problems     due to stroke  . History of cancer chemotherapy     last in August 2014  . GERD (gastroesophageal reflux disease)     food related  . Arthritis     back  . Renal artery  stenosis     a. 20% L RAS in 08/2012 by cath. b. Duplex 07/2012: >60% L RAS.  . LV dysfunction     a. EF 45-50% at time of STEMI 08/2012. b. Echo 04/2013: EF 45-50%, mild focal basal hypertrophy of septum, grade 1 d/d.  Marland Kitchen Vasovagal syncope     a. 04/2013 - echo stable compared to prior EF 45-50%, negative carotid duplex.    Past Surgical History  Procedure Laterality Date  . Coronary angioplasty with stent placement    . Breast lumpectomy with needle localization and axillary sentinel lymph node bx Left 01/08/2013    Procedure: LEFT BREAST WIRE GUIDED  LUMPECTOMY AND LEFT AXILLARY SENTINEL  NODE BX;  Surgeon: Rolm Bookbinder, MD;  Location: [place];  Service: General;  Laterality: Left;  .  Portacath placement Right 02/17/2013    Procedure: INSERTION PORT-A-CATH;  Surgeon: Rolm Bookbinder, MD;  Location: [place];  Service: General;  Laterality: Right;  . Back surgery      x3, lower back  . Cataract extraction Bilateral   . Port-a-cath removal N/A 10/14/2013    Procedure: REMOVAL PORT-A-CATH;  Surgeon: Rolm Bookbinder, MD;  Location: [facility];  Service: General;  Laterality: N/A;  . Left heart catheterization with coronary angiogram N/A 09/07/2012    Procedure: LEFT HEART CATHETERIZATION WITH CORONARY ANGIOGRAM;  Surgeon: Troy Sine, MD;  Location: [facility] CATH LAB;  Service: Cardiovascular;  Laterality: N/A;  . Percutaneous coronary stent intervention (pci-s) N/A 09/07/2012    Procedure: PERCUTANEOUS CORONARY STENT INTERVENTION (PCI-S);  Surgeon: Troy Sine, MD;  Location[facility] C[facility] LAB;  Service: Cardiovascular;  Laterality: N/A;  . Left heart catheterization with coronary angiogram  09/09/2012    Procedure: LEFT HEART CATHETERIZATION WITH CORONARY ANGIOGRAM;  Surgeon: Leonie Man, MD;  Location: [facility] CATH LAB;  Service: Cardiovascular;;     Medications: Current Outpatient Prescriptions  Medication Sig Dispense Refill  . aspirin 81 MG EC tablet Take 1 tablet (81 mg total) by mouth daily. 30 tablet 12  . atorvastatin (LIPITOR) 80 MG tablet Take 1 tablet (80 mg total) by mouth daily with supper. 90 tablet 3  . canagliflozin (INVOKANA) 100 MG TABS tablet Take 1 tablet (100 mg total) by mouth daily. 100 tablet 0  . carvedilol (COREG) 25 MG tablet Take 1 tablet (25 mg total) by mouth 2 (two) times daily with a meal. 60 tablet 1  . glucose blood (ONETOUCH VERIO) test strip Use TID 100 each 12  . HUMALOG KWIKPEN 100 UNIT/ML KiwkPen INJECT 12 UNITS INTO THE SKIN TID WITH MEALS  1  . hydrALAZINE (APRESOLINE) 10 MG tablet TK 1 T PO BID  2  . hydrALAZINE (APRESOLINE) 25 MG tablet Take 1 tablet (25 mg total) by mouth 3 (three) times daily. 90 tablet 6  . Insulin Glargine (LANTUS  SOLOSTAR) 100 UNIT/ML Solostar Pen Inject 18 units [place] at bedtime. 1 pen 3  . isosorbide mononitrate (IMDUR) 60 MG 24 hr tablet Take 1 tablet (60 mg total) by mouth daily. 90 tablet 3  . lisinopril (PRINIVIL,ZESTRIL) 40 MG tablet Take 1 tablet (40 mg total) by mouth daily with breakfast. 90 tablet 3  . metFORMIN (GLUCOPHAGE XR) 750 MG 24 hr tablet Take 2 tablets (1,500 mg total) by mouth daily with breakfast. 180 tablet 1  . nitroGLYCERIN (NITROSTAT) 0.4 MG SL tablet Place 0.4 mg under the tongue every 5 (five) minutes as needed for chest pain.     No current facility-administered medications for this  visit.    Allergies: No Known Allergies  Social History: The patient  reports that she has never smoked. She has never used smokeless tobacco. She reports that she does not drink alcohol or use illicit drugs.   Family History: The patient's family history is negative for Cancer, Diabetes, Hyperlipidemia, Hypertension, Kidney disease, and Stroke.   Review of Systems: Please see the history of present illness.   Otherwise, the review of systems is positive for none.   All other systems are reviewed and negative.   Physical Exam: VS:  BP 188/90 mmHg  Pulse 91  Ht 5\' 5"  (1.651 m)  Wt 147 lb 12.8 oz (67.042 kg)  BMI 24.60 kg/m2  SpO2 97% .  BMI Body mass index is 24.6 kg/(m^2).  Wt Readings from Last 3 Encounters:  06/01/15 147 lb 12.8 oz (67.042 kg)  06/01/15 146 lb 8 oz (66.452 kg)  06/01/15 147 lb (66.679 kg)    General: Pleasant. Well developed, well nourished and in no acute distress.  HEENT: Normal. Neck: Supple, no JVD, carotid bruits, or masses noted.  Cardiac: Regular rate and rhythm. No murmurs, rubs, or gallops. No edema.  Respiratory:  Lungs are clear to auscultation bilaterally with normal work of breathing.  GI: Soft and nontender.  MS: No deformity or atrophy. Gait and ROM intact. Skin: Warm and dry. Color is normal.  Neuro:  Strength and sensation are intact and no  gross focal deficits noted.  Psych: Alert, appropriate and with normal affect.   LABORATORY DATA:  EKG:  EKG is not ordered today.   Lab Results  Component Value Date   WBC 5.1 05/21/2015   HGB 14.7 05/21/2015   HCT 44.0 05/21/2015   PLT 168.0 05/21/2015   GLUCOSE 329* 06/01/2015   CHOL 139 06/01/2015   TRIG 86.0 06/01/2015   HDL 58.50 06/01/2015   LDLCALC 63 06/01/2015   ALT 15 04/11/2015   AST 15 04/11/2015   NA 138 06/01/2015   K 4.2 06/01/2015   CL 103 06/01/2015   CREATININE 0.66 06/01/2015   BUN 14 06/01/2015   CO2 29 06/01/2015   TSH 0.28* 08/14/2014   INR 1.66* 04/11/2015   HGBA1C 10.8* 06/01/2015   MICROALBUR 1.6 06/01/2015    BNP (last 3 results)  Recent Labs  04/16/15 0424  BNP 68.9    ProBNP (last 3 results) No results for input(s): PROBNP in the last 8760 hours.   Other Studies Reviewed Today:  ECHO: - Left ventricle: The cavity size was normal. There was moderate concentric hypertrophy. Systolic function was severely reduced. The estimated ejection fraction was in the range of 25% to 30%. Diffuse hypokinesis. There is akinesis of the anterior myocardium. Acoustic contrast opacification revealed no evidence ofthrombus.  Impressions: - No cardiac source of emboli was indentified.   Carotid doppler: Findings suggest 1-39% internal carotid artery stenosis bilaterally. Vertebral arteries are patent with antegrade flow.   MR cardiac stress test: Per cardiology the findings are showing that the degree of left ventricular systolic impairment are out of proportion to the infarct area and other causes such as hypertension and prior chemotherapy are most probably contributing to the degree of cardiomyopathy.   CARDIAC MR IMPRESSION: 1. Normal left ventricular size with moderate concentric hypertrophy and severely decreased systolic function (LVEF = 26%). There is akinesis of the basal anterior, mid anterior, anteroseptal  and apical anterior and septal walls.  2. Normal right ventricular size, thickness and low normal systolic function (RVEF = 42%).  3. Stress test indicates that there is a scar in the entire anterior, mid anteroseptal and apical septal walls with minimal ischemia in the mid inferoseptal wall.  4. Late gadolinium images show a scar a scar in the entire anterior, mid anteroseptal and apical septal walls with poor recovery if revascularized.  Collectively, these findings are showing that the degree of left ventricular systolic impairment are out of proportion to the infarct area and other causes such as hypertension and prior chemotherapy are most probably contributing to the degree of cardiomyopathy.  I would not recommend a cath but rather aggressive management of hypertension and treatment of systolic heart failure.  Ena Dawley   Electronically Signed  By: Ena Dawley  On: 04/15/2015 09:02  Assessment/Plan:  1. Possible brainstem TIA from small vessel diseases vs Partial 3rd nerve palsy  -Diplopia has resolved.   2. Systolic heart failure/cardiomyopathy -EF found to be 20-25% - this is likely due to medication non-compliance and diet indiscretion -Life Vest in place -currently with stable volume status. Will need repeat echo in 3 months (after 07/14/15) to reassess for possible ICD implant.   3. HTN, malignant;  -increasing Hydralazine to 50mg  TID  4. Diabetes mellitus, uncontrolled. -HbA1c 13.2  - saw PCP today - had meds adjusted  5. New large L parotid mass 8x5mm - saw ENT today and will be having an ultrasound/biopsy  6. Coronary artery disease; continue with aspirin, metoprolol, statins and nitrates. -S/P MR cardiac stress test, results demonstrated no need for catheterization at this moment -she has no symptoms  7. HLD -continue statins  8. Renal artery stenosis - she was seen by Dr. Donnetta Hutching earlier today. His note was reviewed.  His note says "Explained the ultrasound did suggest a high-grade stenosis of her left renal artery. Explained that this may be contributing to her severe hypertension but that this could also be essential hypertension with no correlation. I have recommended arteriography further evaluation and would recommend angioplasty and stenting if she has a high-grade left renal artery stenosis. I would certainly defer this until she has had a workup of her parotid lesion and clearance of her cardiac status. We will continue to communicate with her and plan outpatient arteriography and possible left renal arteries intervention"  Current medicines are reviewed with the patient today.  The patient does not have concerns regarding medicines other than what has been noted above.  The following changes have been made:  See above.  Labs/ tests ordered today include:   No orders of the defined types were placed in this encounter.     Disposition:   FU with Dr. Harrington Challenger after her echo.   Patient is agreeable to this plan and will call if any problems develop in the interim.   Signed: Burtis Junes, RN, ANP-C 06/01/2015 3:21 PM  [facility] [address] [place], [place]  [address] Phone: [phone] Fax: [phone]

## 2015-06-01 NOTE — Progress Notes (Signed)
Subjective:  Patient ID: Marisa Forbes, female    DOB: [birth year]  Age: 64 y.o. MRN: [id number]  CC: Diabetes   HPI [name] presents for follow-up on diabetes. She was recently admitted for a cerebrovascular accident and feels like she is improving. Her only neurological deficit is a loss of her left visual field. She feels like her blood sugars have been more controlled over the last month. She routinely has daytime blood sugars in the 140-200 range. She has appointments today with ENT, vascular surgery, and cardiology.  Outpatient Prescriptions Prior to Visit  Medication Sig Dispense Refill  . aspirin 81 MG EC tablet Take 1 tablet (81 mg total) by mouth daily. 30 tablet 12  . atorvastatin (LIPITOR) 80 MG tablet Take 1 tablet (80 mg total) by mouth daily with supper. 90 tablet 3  . carvedilol (COREG) 25 MG tablet Take 1 tablet (25 mg total) by mouth 2 (two) times daily with a meal. 60 tablet 1  . glucose blood (ONETOUCH VERIO) test strip Use TID 100 each 12  . HUMALOG KWIKPEN 100 UNIT/ML KiwkPen INJECT 12 UNITS INTO THE SKIN TID WITH MEALS  1  . hydrALAZINE (APRESOLINE) 25 MG tablet Take 1 tablet (25 mg total) by mouth 3 (three) times daily. 90 tablet 6  . Insulin Glargine (LANTUS SOLOSTAR) 100 UNIT/ML Solostar Pen Inject 18 units [place] at bedtime. 1 pen 3  . isosorbide mononitrate (IMDUR) 60 MG 24 hr tablet Take 1 tablet (60 mg total) by mouth daily. 90 tablet 3  . lisinopril (PRINIVIL,ZESTRIL) 40 MG tablet Take 1 tablet (40 mg total) by mouth daily with breakfast. 90 tablet 3  . nitroGLYCERIN (NITROSTAT) 0.4 MG SL tablet Place 0.4 mg under the tongue every 5 (five) minutes as needed for chest pain.    . cefUROXime (CEFTIN) 500 MG tablet Take 1 tablet (500 mg total) by mouth 2 (two) times daily with a meal. 10 tablet 0  . metFORMIN (GLUCOPHAGE XR) 750 MG 24 hr tablet Take 1 tablet (750 mg total) by mouth daily with breakfast. 30 tablet 1   No facility-administered medications prior  to visit.    ROS Review of Systems  Constitutional: Negative.  Negative for fever, chills, diaphoresis, appetite change and fatigue.  HENT: Negative.   Eyes: Positive for visual disturbance.  Respiratory: Negative.  Negative for cough, choking, chest tightness, shortness of breath and stridor.   Cardiovascular: Negative.  Negative for chest pain, palpitations and leg swelling.  Gastrointestinal: Negative.  Negative for nausea, vomiting, abdominal pain, diarrhea, constipation and blood in stool.  Endocrine: Positive for polydipsia and polyphagia. Negative for polyuria.  Genitourinary: Negative.  Negative for dysuria, urgency, frequency, hematuria, flank pain, decreased urine volume and difficulty urinating.  Musculoskeletal: Negative.  Negative for myalgias, back pain, joint swelling and arthralgias.  Skin: Negative.   Allergic/Immunologic: Negative.   Neurological: Negative.   Hematological: Negative.  Negative for adenopathy. Does not bruise/bleed easily.  Psychiatric/Behavioral: Negative.     Objective:  BP 140/88 mmHg  Pulse 72  Temp(Src) 98.5 F (36.9 C) (Oral)  Resp 16  Ht 5\' 5"  (1.651 m)  Wt 147 lb (66.679 kg)  BMI 24.46 kg/m2  SpO2 96%  BP Readings from Last 3 Encounters:  06/01/15 155/88  06/01/15 140/88  05/21/15 160/90    Wt Readings from Last 3 Encounters:  06/01/15 146 lb 8 oz (66.452 kg)  06/01/15 147 lb (66.679 kg)  05/21/15 145 lb 6.4 oz (65.953 kg)  Physical Exam  Constitutional: She is oriented to person, place, and time. No distress.  HENT:  Mouth/Throat: Oropharynx is clear and moist. No oropharyngeal exudate.  Eyes: Conjunctivae are normal. Right eye exhibits no discharge. Left eye exhibits no discharge. No scleral icterus.  Neck: Normal range of motion. Neck supple. No JVD present. No tracheal deviation present. No thyromegaly present.  Cardiovascular: Normal rate, regular rhythm, normal heart sounds and intact distal pulses.  Exam reveals no  gallop and no friction rub.   No murmur heard. Pulmonary/Chest: Effort normal and breath sounds normal. No stridor. No respiratory distress. She has no wheezes. She has no rales. She exhibits no tenderness.  Abdominal: Soft. Bowel sounds are normal. She exhibits no distension and no mass. There is no tenderness. There is no rebound and no guarding.  Musculoskeletal: Normal range of motion. She exhibits no edema or tenderness.  Lymphadenopathy:    She has no cervical adenopathy.  Neurological: She is oriented to person, place, and time.  Skin: Skin is warm and dry. No rash noted. She is not diaphoretic. No erythema. No pallor.  Psychiatric: She has a normal mood and affect. Her behavior is normal. Judgment and thought content normal.  Vitals reviewed.   Lab Results  Component Value Date   WBC 5.1 05/21/2015   HGB 14.7 05/21/2015   HCT 44.0 05/21/2015   PLT 168.0 05/21/2015   GLUCOSE 329* 06/01/2015   CHOL 139 06/01/2015   TRIG 86.0 06/01/2015   HDL 58.50 06/01/2015   LDLCALC 63 06/01/2015   ALT 15 04/11/2015   AST 15 04/11/2015   NA 138 06/01/2015   K 4.2 06/01/2015   CL 103 06/01/2015   CREATININE 0.66 06/01/2015   BUN 14 06/01/2015   CO2 29 06/01/2015   TSH 0.28* 08/14/2014   INR 1.66* 04/11/2015   HGBA1C 10.8* 06/01/2015   MICROALBUR 1.6 06/01/2015    Ct Head Wo Contrast  04/11/2015   CLINICAL DATA:  Vision changes, hyperglycemia  EXAM: CT HEAD WITHOUT CONTRAST  TECHNIQUE: Contiguous axial images were obtained from the base of the skull through the vertex without intravenous contrast.  COMPARISON:  MR brain 06/27/2013  FINDINGS: There is no evidence of mass effect, midline shift, or extra-axial fluid collections. There is no evidence of a space-occupying lesion or intracranial hemorrhage. There is no evidence of a cortical-based area of acute infarction. There is an old right posterior parietal lobe infarct with encephalomalacia. There is an old small right cerebellar  infarct. There is periventricular white matter low attenuation likely secondary to microangiopathy.  The ventricles and sulci are appropriate for the patient's age. The basal cisterns are patent.  Visualized portions of the orbits are unremarkable. The visualized portions of the paranasal sinuses and mastoid air cells are unremarkable. Cerebrovascular atherosclerotic calcifications are noted.  The osseous structures are unremarkable.  IMPRESSION: 1. No acute intracranial pathology. 2. Chronic microvascular disease .   Electronically Signed   By: Kathreen Devoid   On: 04/11/2015 16:30   Mr Brain Wo Contrast  04/11/2015   CLINICAL DATA:  Diplopia, worsening balance beginning yesterday at 3 p.m. History of stroke, diabetes, hypertension, breast cancer.  EXAM: MRI HEAD WITHOUT CONTRAST  MRA HEAD WITHOUT CONTRAST  TECHNIQUE: Multiplanar, multiecho pulse sequences of the brain and surrounding structures were obtained without intravenous contrast. Angiographic images of the head were obtained using MRA technique without contrast.  COMPARISON:  CT head April 11, 2015 at 1626 hours and MRI of the brain June 27, 2013  FINDINGS: MRI HEAD FINDINGS  No reduced diffusion to suggest acute ischemia. New subcentimeter focus of susceptibility artifact RIGHT pons, LEFT mesial temporal lobe, with additional scattered small foci of susceptibility artifact which were present previously. Old RIGHT occipital lobe hemorrhagic infarct. Ventricles and sulci are normal for patient's age. Patchy supratentorial, pontine and RIGHT cerebellar white matter T2 hyperintensities are advanced from prior imaging. No midline shift or mass effect. Remote small RIGHT inferior cerebellar infarcts were present previously. Small rib remote RIGHT brachium pontis infarct. Remote RIGHT thalamus lacunar infarcts.  No abnormal extra-axial fluid collections. Status post bilateral ocular lens implants. Paranasal sinuses and mastoid air cells are well aerated.  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 No abnormal sellar expansion. No cerebellar tonsillar ectopia. No suspicious calvarial bone marrow signal. Scattered scalp small sebaceous cysts. New 8 x 13 mm low signal mass LEFT parotid gland with reduced diffusion.  MRA HEAD FINDINGS  Anterior circulation: Normal flow related enhancement of the included cervical, petrous, cavernous and supra clinoid internal carotid arteries. Moderate symmetric stenosis of the proximal supraclinoid internal carotid arteries, likely due to atherosclerosis. Patent anterior communicating artery. Moderate stenosis LEFT A1 segment origin. Patent anterior communicating artery with normal flow related enhancement of the anterior cerebral arteries. Moderate luminal irregularity/stenosis LEFT M1 segment. Bilateral middle cerebral arteries are patent including mid to distal segments.  No large vessel occlusion, high-grade stenosis, aneurysm.  Posterior circulation: RIGHT vertebral artery is dominant. Basilar artery is patent, with normal flow related enhancement of the main branch vessels. Moderate stenosis distal basilar artery at the level of the superior cerebellar artery origins. Thready, irregular LEFT V4 segment flow related enhancement, poorly visualized distally. Moderate stenosis mid to distal RIGHT posterior cerebral artery. Bilateral posterior cerebral arteries are patent.  No large vessel occlusion, high-grade stenosis, aneurysm.  IMPRESSION: MRI HEAD: No acute intracranial process, specifically no acute ischemia.  Old RIGHT hemorrhagic posterior cerebral artery territory infarct. Additional areas of susceptibility artifact are progressed from prior examination suggesting sequelae of chronic hypertension, unlikely to represent hemorrhagic metastasis. Moderate advanced white matter changes most consistent chronic small vessel ischemic disease. Old small infarcts as described above.  New 8 x 13 mm mass LEFT parotid gland which  could represent intra parotid lymph node though considering somewhat suspicious imaging characteristics, recommend dedicated follow-up.  MRA HEAD: No large vessel occlusion. Thready, irregular LEFT vertebral artery with poor visualization distal V4 segment which can be seen with slow flow or, high-grade stenosis.  Moderate luminal irregularity/ stenosis of the anterior and posterior circulation consistent with atherosclerosis.   Electronically Signed   By: Elon Alas M.D.   On: 04/11/2015 23:06   Mr Jodene Nam Head/brain Wo Cm  04/11/2015   CLINICAL DATA:  Diplopia, worsening balance beginning yesterday at 3 p.m. History of stroke, diabetes, hypertension, breast cancer.  EXAM: MRI HEAD WITHOUT CONTRAST  MRA HEAD WITHOUT CONTRAST  TECHNIQUE: Multiplanar, multiecho pulse sequences of the brain and surrounding structures were obtained without intravenous contrast. Angiographic images of the head were obtained using MRA technique without contrast.  COMPARISON:  CT head April 11, 2015 at 1626 hours and MRI of the brain June 27, 2013  FINDINGS: MRI HEAD FINDINGS  No reduced diffusion to suggest acute ischemia. New subcentimeter focus of susceptibility artifact RIGHT pons, LEFT mesial temporal lobe, with additional scattered small foci of susceptibility artifact which were present previously. Old RIGHT occipital lobe hemorrhagic infarct. Ventricles and sulci are normal for patient's age. Patchy supratentorial, pontine and RIGHT cerebellar white  matter T2 hyperintensities are advanced from prior imaging. No midline shift or mass effect. Remote small RIGHT inferior cerebellar infarcts were present previously. Small rib remote RIGHT brachium pontis infarct. Remote RIGHT thalamus lacunar infarcts.  No abnormal extra-axial fluid collections. Status post bilateral ocular lens implants. Paranasal sinuses and mastoid air cells are well aerated. 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 No abnormal sellar expansion.  No cerebellar tonsillar ectopia. No suspicious calvarial bone marrow signal. Scattered scalp small sebaceous cysts. New 8 x 13 mm low signal mass LEFT parotid gland with reduced diffusion.  MRA HEAD FINDINGS  Anterior circulation: Normal flow related enhancement of the included cervical, petrous, cavernous and supra clinoid internal carotid arteries. Moderate symmetric stenosis of the proximal supraclinoid internal carotid arteries, likely due to atherosclerosis. Patent anterior communicating artery. Moderate stenosis LEFT A1 segment origin. Patent anterior communicating artery with normal flow related enhancement of the anterior cerebral arteries. Moderate luminal irregularity/stenosis LEFT M1 segment. Bilateral middle cerebral arteries are patent including mid to distal segments.  No large vessel occlusion, high-grade stenosis, aneurysm.  Posterior circulation: RIGHT vertebral artery is dominant. Basilar artery is patent, with normal flow related enhancement of the main branch vessels. Moderate stenosis distal basilar artery at the level of the superior cerebellar artery origins. Thready, irregular LEFT V4 segment flow related enhancement, poorly visualized distally. Moderate stenosis mid to distal RIGHT posterior cerebral artery. Bilateral posterior cerebral arteries are patent.  No large vessel occlusion, high-grade stenosis, aneurysm.  IMPRESSION: MRI HEAD: No acute intracranial process, specifically no acute ischemia.  Old RIGHT hemorrhagic posterior cerebral artery territory infarct. Additional areas of susceptibility artifact are progressed from prior examination suggesting sequelae of chronic hypertension, unlikely to represent hemorrhagic metastasis. Moderate advanced white matter changes most consistent chronic small vessel ischemic disease. Old small infarcts as described above.  New 8 x 13 mm mass LEFT parotid gland which could represent intra parotid lymph node though considering somewhat suspicious  imaging characteristics, recommend dedicated follow-up.  MRA HEAD: No large vessel occlusion. Thready, irregular LEFT vertebral artery with poor visualization distal V4 segment which can be seen with slow flow or, high-grade stenosis.  Moderate luminal irregularity/ stenosis of the anterior and posterior circulation consistent with atherosclerosis.   Electronically Signed   By: Elon Alas M.D.   On: 04/11/2015 23:06    Assessment & Plan:   Ylonda was seen today for diabetes.  Diagnoses and all orders for this visit:  Type 2 diabetes mellitus with other diabetic neurological complication- her blood sugars are better but still not well controlled. I have increased the dose on her metformin from 750 milligrams per day to 1500 mg per day. Will also add invokana. I have asked her to see diabetic education to learn how to do a sliding scale for insulin dosing to treat high blood sugars.. Have also asked her to see endocrinology for further evaluation. Her renal function is stable. Orders: -     canagliflozin (INVOKANA) 100 MG TABS tablet; Take 1 tablet (100 mg total) by mouth daily. -     Ambulatory referral to Endocrinology -     Amb Referral to Nutrition and Diabetic E -     Basic metabolic panel; Future -     Hemoglobin A1c; Future -     Urinalysis, Routine w reflex microscopic (not at [facility]); Future -     Microalbumin / creatinine urine ratio; Future -     metFORMIN (GLUCOPHAGE XR) 750 MG 24 hr tablet; Take 2 tablets (1,500 mg  total) by mouth daily with breakfast.  Hyperlipidemia with target LDL less than 70- she has achieved her LDL goal is doing well on the statin. Orders: -     Lipid panel; Future  Screen for colon cancer Orders: -     Ambulatory referral to Gastroenterology  Need for Tdap vaccination Orders: -     Tdap vaccine greater than or equal to 7yo IM  I have discontinued Ms. Summer's cefUROXime. I have also changed her metFORMIN. Additionally, I am having her start on  canagliflozin. Lastly, I am having her maintain her glucose blood, nitroGLYCERIN, atorvastatin, aspirin, carvedilol, Insulin Glargine, isosorbide mononitrate, lisinopril, HUMALOG KWIKPEN, hydrALAZINE, and hydrALAZINE.  Meds ordered this encounter  Medications  . hydrALAZINE (APRESOLINE) 10 MG tablet    Sig: TK 1 T PO BID    Refill:  2  . canagliflozin (INVOKANA) 100 MG TABS tablet    Sig: Take 1 tablet (100 mg total) by mouth daily.    Dispense:  100 tablet    Refill:  0  . metFORMIN (GLUCOPHAGE XR) 750 MG 24 hr tablet    Sig: Take 2 tablets (1,500 mg total) by mouth daily with breakfast.    Dispense:  180 tablet    Refill:  1     Follow-up: Return in about 3 months (around 09/01/2015).  Scarlette Calico, MD

## 2015-06-02 ENCOUNTER — Other Ambulatory Visit (HOSPITAL_COMMUNITY): Payer: Self-pay | Admitting: Otolaryngology

## 2015-06-02 DIAGNOSIS — K118 Other diseases of salivary glands: Secondary | ICD-10-CM

## 2015-06-08 ENCOUNTER — Ambulatory Visit (HOSPITAL_COMMUNITY): Payer: BLUE CROSS/BLUE SHIELD

## 2015-06-09 ENCOUNTER — Other Ambulatory Visit: Payer: Self-pay | Admitting: Radiology

## 2015-06-09 ENCOUNTER — Ambulatory Visit (HOSPITAL_COMMUNITY): Payer: BLUE CROSS/BLUE SHIELD

## 2015-06-10 ENCOUNTER — Other Ambulatory Visit: Payer: Self-pay | Admitting: Physician Assistant

## 2015-06-10 ENCOUNTER — Other Ambulatory Visit: Payer: Self-pay | Admitting: Radiology

## 2015-06-11 ENCOUNTER — Ambulatory Visit (HOSPITAL_COMMUNITY): Payer: BLUE CROSS/BLUE SHIELD

## 2015-06-11 ENCOUNTER — Encounter: Payer: Self-pay | Admitting: Endocrinology

## 2015-06-11 ENCOUNTER — Ambulatory Visit (HOSPITAL_COMMUNITY)
Admission: RE | Admit: 2015-06-11 | Discharge: 2015-06-11 | Disposition: A | Discharge status: Home / Self Care | Payer: Self-pay | Source: Ambulatory Visit | Attending: Otolaryngology | Admitting: Otolaryngology

## 2015-06-11 ENCOUNTER — Other Ambulatory Visit (HOSPITAL_COMMUNITY): Payer: Self-pay | Admitting: Otolaryngology

## 2015-06-11 ENCOUNTER — Ambulatory Visit (HOSPITAL_COMMUNITY)
Admission: RE | Admit: 2015-06-11 | Discharge: 2015-06-11 | Disposition: A | Discharge status: Home / Self Care | Payer: BLUE CROSS/BLUE SHIELD | Source: Ambulatory Visit | Attending: Otolaryngology | Admitting: Otolaryngology

## 2015-06-11 DIAGNOSIS — M5030 Other cervical disc degeneration, unspecified cervical region: Secondary | ICD-10-CM | POA: Insufficient documentation

## 2015-06-11 DIAGNOSIS — I6523 Occlusion and stenosis of bilateral carotid arteries: Secondary | ICD-10-CM | POA: Insufficient documentation

## 2015-06-11 DIAGNOSIS — M479 Spondylosis, unspecified: Secondary | ICD-10-CM | POA: Insufficient documentation

## 2015-06-11 DIAGNOSIS — K118 Other diseases of salivary glands: Secondary | ICD-10-CM

## 2015-06-11 DIAGNOSIS — R220 Localized swelling, mass and lump, head: Secondary | ICD-10-CM | POA: Insufficient documentation

## 2015-06-11 MED ORDER — IOHEXOL 300 MG/ML  SOLN
75.0000 mL | Freq: Once | INTRAMUSCULAR | Status: AC | PRN
  Administered 2015-06-11: 100 mL via INTRAVENOUS

## 2015-06-11 MED ORDER — SODIUM CHLORIDE 0.9 % IV SOLN: Freq: Once | INTRAVENOUS | Status: DC

## 2015-06-11 NOTE — Progress Notes (Signed)
Pt had a contrast extravasation during the injection for her CT Neck. Pt denies any pain, burning, just stated that her arm "felt full." Icepack applied to RIGHT antecubital area as well as giving pt OP instructions.

## 2015-06-22 ENCOUNTER — Encounter: Payer: Self-pay | Admitting: Internal Medicine

## 2015-07-02 ENCOUNTER — Ambulatory Visit: Payer: BLUE CROSS/BLUE SHIELD | Admitting: Internal Medicine

## 2015-07-14 ENCOUNTER — Telehealth: Payer: Self-pay | Admitting: Internal Medicine

## 2015-07-14 NOTE — Telephone Encounter (Signed)
Spoke with pt.  New insurance is [employer] only.

## 2015-07-15 ENCOUNTER — Other Ambulatory Visit: Payer: Self-pay

## 2015-07-15 ENCOUNTER — Ambulatory Visit (HOSPITAL_COMMUNITY): Payer: 59 | Attending: Cardiology

## 2015-07-15 ENCOUNTER — Other Ambulatory Visit: Payer: Self-pay | Admitting: Nurse Practitioner

## 2015-07-15 ENCOUNTER — Telehealth: Payer: Self-pay | Admitting: Internal Medicine

## 2015-07-15 DIAGNOSIS — I429 Cardiomyopathy, unspecified: Secondary | ICD-10-CM | POA: Insufficient documentation

## 2015-07-15 DIAGNOSIS — I2583 Coronary atherosclerosis due to lipid rich plaque: Principal | ICD-10-CM

## 2015-07-15 DIAGNOSIS — E785 Hyperlipidemia, unspecified: Secondary | ICD-10-CM | POA: Insufficient documentation

## 2015-07-15 DIAGNOSIS — I2510 Atherosclerotic heart disease of native coronary artery without angina pectoris: Secondary | ICD-10-CM | POA: Diagnosis not present

## 2015-07-15 DIAGNOSIS — I5022 Chronic systolic (congestive) heart failure: Secondary | ICD-10-CM

## 2015-07-15 DIAGNOSIS — I10 Essential (primary) hypertension: Secondary | ICD-10-CM

## 2015-07-15 DIAGNOSIS — I701 Atherosclerosis of renal artery: Secondary | ICD-10-CM

## 2015-07-15 DIAGNOSIS — E119 Type 2 diabetes mellitus without complications: Secondary | ICD-10-CM | POA: Insufficient documentation

## 2015-07-15 MED ORDER — PERFLUTREN LIPID MICROSPHERE
1.0000 mL | INTRAVENOUS | Status: AC | PRN
  Administered 2015-07-15: 1 mL via INTRAVENOUS

## 2015-07-15 NOTE — Telephone Encounter (Signed)
error 

## 2015-07-15 NOTE — Telephone Encounter (Signed)
New Message  Pt returning Michelle's phone call concerning echo results. Can leave detailed VM

## 2015-07-15 NOTE — Telephone Encounter (Signed)
Notified the pt of her echo results per Truitt Merle NP mentioned below. Advised the pt to come in for her follow-up appt with Dr Harrington Challenger on 07/26/15 0830 for further discussion Pt verbalized understanding and agrees with this plan.   Notes Recorded by Burtis Junes, NP on 07/15/2015 at 12:26 PM Ok to report. The follow up echo looks better - her pumping function has improved! This is good. She needs to keep her follow up with Dr. Harrington Challenger for later this month for further discussion.

## 2015-07-26 ENCOUNTER — Encounter: Payer: Self-pay | Admitting: Internal Medicine

## 2015-07-26 ENCOUNTER — Ambulatory Visit (INDEPENDENT_AMBULATORY_CARE_PROVIDER_SITE_OTHER): Payer: 59 | Admitting: Internal Medicine

## 2015-07-26 VITALS — BP 144/76 | HR 102 | Ht 65.0 in | Wt 146.0 lb

## 2015-07-26 DIAGNOSIS — E785 Hyperlipidemia, unspecified: Secondary | ICD-10-CM

## 2015-07-26 DIAGNOSIS — I10 Essential (primary) hypertension: Secondary | ICD-10-CM

## 2015-07-26 MED ORDER — CARVEDILOL 25 MG PO TABS: 25.0000 mg | ORAL_TABLET | Freq: Two times a day (BID) | ORAL | Status: DC

## 2015-07-26 NOTE — Patient Instructions (Signed)
Your physician recommends that you continue on your current medications as directed. Please refer to the Current Medication list given to you today. (a refill for carvedilol 25 mg was sent to your pharmacy today)  Your physician wants you to follow-up in: 3 months with Dr. Harrington Challenger.  You will receive a reminder letter in the mail two months in advance. If you don't receive a letter, please call our office to schedule the follow-up appointment.

## 2015-07-26 NOTE — Progress Notes (Signed)
Cardiology Office Note   Date:  07/26/2015   ID:  Marisa Forbes, DOB 08-Nov-1950, MRN [id number]  PCP:  Scarlette Calico, MD  Cardiologist:   Dorris Carnes, MD   No chief complaint on file.     History of Present Illness: Pt is a 64 yo with history of HTN, breast CA, DM, CAD and embolic CVA.   She was last in clinic on Jun 01, 2015 When hospitalized this summer echo showed LVEF 20 to 25%  She was sent home with a Life Vest    Since d/c she denies CP  Breathing is OK  No palpitations She still gets tired. Echo done on 9/15 LVEF 40 to 45%   Current Outpatient Prescriptions  Medication Sig Dispense Refill  . aspirin 81 MG EC tablet Take 1 tablet (81 mg total) by mouth daily. 30 tablet 12  . atorvastatin (LIPITOR) 80 MG tablet Take 1 tablet (80 mg total) by mouth daily with supper. 90 tablet 3  . canagliflozin (INVOKANA) 100 MG TABS tablet Take 1 tablet (100 mg total) by mouth daily. 100 tablet 0  . carvedilol (COREG) 25 MG tablet Take 1 tablet (25 mg total) by mouth 2 (two) times daily with a meal. 60 tablet 1  . glucose blood (ONETOUCH VERIO) test strip Use TID 100 each 12  . HUMALOG KWIKPEN 100 UNIT/ML KiwkPen INJECT 12 UNITS INTO THE SKIN TID WITH MEALS  1  . hydrALAZINE (APRESOLINE) 50 MG tablet Take 1 tablet (50 mg total) by mouth 3 (three) times daily. 270 tablet 3  . Insulin Glargine (LANTUS SOLOSTAR) 100 UNIT/ML Solostar Pen Inject 18 units Norphlet at bedtime. 1 pen 3  . isosorbide mononitrate (IMDUR) 60 MG 24 hr tablet Take 1 tablet (60 mg total) by mouth daily. 90 tablet 3  . lisinopril (PRINIVIL,ZESTRIL) 40 MG tablet Take 1 tablet (40 mg total) by mouth daily with breakfast. 90 tablet 3  . metFORMIN (GLUCOPHAGE XR) 750 MG 24 hr tablet Take 2 tablets (1,500 mg total) by mouth daily with breakfast. 180 tablet 1  . nitroGLYCERIN (NITROSTAT) 0.4 MG SL tablet Place 0.4 mg under the tongue every 5 (five) minutes as needed for chest pain.     No current facility-administered medications  for this visit.    Allergies:   Review of patient's allergies indicates no known allergies.   Past Medical History  Diagnosis Date  . HTN (hypertension)   . Diabetes mellitus without complication   . Myocardial infarction 09/07/12  . Breast cancer 12/03/12    a. 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 S/p chemo with CMF and radiation.  Marland Kitchen History of radiation therapy 06/23/2013-08/08/2013    62.4 gray to left breast  . Hypercholesteremia   . Coronary artery disease     a. STEMI 08/2012: s/p DES to LAD, PTCA to downstream LAD. b. Relook cath same hospitalization: stable post-PCI anatomy with Stable Diag lesions (unlikely cause of resting angina, not good PCI targets), stable RCA lesion (non flow limiting).  . Stroke, embolic     a. 59/1638 post cath.  . Peripheral vision loss     left  . Balance problems     due to stroke  . History of cancer chemotherapy     last in August 2014  . GERD (gastroesophageal reflux disease)     food related  . Arthritis     back  . Renal artery stenosis     a. 20% L  RAS in 08/2012 by cath. b. Duplex 07/2012: >60% L RAS.  . LV dysfunction     a. EF 45-50% at time of STEMI 08/2012. b. Echo 04/2013: EF 45-50%, mild focal basal hypertrophy of septum, grade 1 d/d.  Marland Kitchen Vasovagal syncope     a. 04/2013 - echo stable compared to prior EF 45-50%, negative carotid duplex.    Past Surgical History  Procedure Laterality Date  . Coronary angioplasty with stent placement    . Breast lumpectomy with needle localization and axillary sentinel lymph node bx Left 01/08/2013    Procedure: LEFT BREAST WIRE GUIDED  LUMPECTOMY AND LEFT AXILLARY SENTINEL  NODE BX;  Surgeon: Rolm Bookbinder, MD;  Location: [place];  Service: General;  Laterality: Left;  . Portacath placement Right 02/17/2013    Procedure: INSERTION PORT-A-CATH;  Surgeon: Rolm Bookbinder, MD;  Location: [place];  Service: General;  Laterality: Right;  . Back surgery      x3, lower  back  . Cataract extraction Bilateral   . Port-a-cath removal N/A 10/14/2013    Procedure: REMOVAL PORT-A-CATH;  Surgeon: Rolm Bookbinder, MD;  Location: [facility];  Service: General;  Laterality: N/A;  . Left heart catheterization with coronary angiogram N/A 09/07/2012    Procedure: LEFT HEART CATHETERIZATION WITH CORONARY ANGIOGRAM;  Surgeon: Troy Sine, MD;  Location: [facility] CATH LAB;  Service: Cardiovascular;  Laterality: N/A;  . Percutaneous coronary stent intervention (pci-s) N/A 09/07/2012    Procedure: PERCUTANEOUS CORONARY STENT INTERVENTION (PCI-S);  Surgeon: Troy Sine, MD;  Location: [facility] CATH LAB;  Service: Cardiovascular;  Laterality: N/A;  . Left heart catheterization with coronary angiogram  09/09/2012    Procedure: LEFT HEART CATHETERIZATION WITH CORONARY ANGIOGRAM;  Surgeon: Leonie Man, MD;  Location: [facility] CATH LAB;  Service: Cardiovascular;;     Social History:  The patient  reports that she has never smoked. She has never used smokeless tobacco. She reports that she does not drink alcohol or use illicit drugs.   Family History:  The patient's family history includes Heart disease in her father; Hypertension in her father. There is no history of Cancer, Diabetes, Hyperlipidemia, Kidney disease, or Stroke.    ROS:  Please see the history of present illness. All other systems are reviewed and  Negative to the above problem except as noted.    PHYSICAL EXAM: VS:  BP 144/76 mmHg  Pulse 102  Ht 5\' 5"  (1.651 m)  Wt 146 lb (66.225 kg)  BMI 24.30 kg/m2  GEN: Well nourished, well developed, in no acute distress HEENT: normal Neck: no JVD, carotid bruits, or masses Cardiac: RRR; Gr I/VI systolic murmur LSB, rubs, or gallops,no edema   Decreased pulse L radial  No signif radial/femoral delay Respiratory:  clear to auscultation bilaterally, normal work of breathing GI: soft, nontender, nondistended, + BS  No hepatomegaly  MS: no deformity Moving all extremities   Skin: warm  and dry, no rash Neuro:  Strength and sensation are intact Psych: euthymic mood, full affect   EKG:  EKG is not ordered today.   Lipid Panel    Component Value Date/Time   CHOL 139 06/01/2015 0854   TRIG 86.0 06/01/2015 0854   HDL 58.50 06/01/2015 0854   CHOLHDL 2 06/01/2015 0854   VLDL 17.2 06/01/2015 0854   LDLCALC 63 06/01/2015 0854      Wt Readings from Last 3 Encounters:  07/26/15 146 lb (66.225 kg)  06/01/15 147 lb 12.8 oz (67.042 kg)  06/01/15 146 lb  8 oz (66.452 kg)      ASSESSMENT AND PLAN:  1.  CAD  No symptoms to sugg angina  2.  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  HTN  Malignant  BP is ok today on meds  She says runs 140s to 150s   She has seen [facility]  Intervention for RAS delayed   Part pends on parotid ork up.   Keep on same reigmen  Follow  5.  DM  6  Parotid mass  Pt has been seen by Hurman Horn (ENT)  Bx was not done  Need to get records    7  HL  Keep on statin    8  RAS        Signed, Dorris Carnes, MD  07/26/2015 8:51 AM    [facility] [address], [place], [place]  [address] Phone: [phone]; Fax: [phone]

## 2015-07-29 ENCOUNTER — Emergency Department (HOSPITAL_COMMUNITY): Payer: 59

## 2015-07-29 ENCOUNTER — Observation Stay (HOSPITAL_COMMUNITY)
Admission: EM | Admit: 2015-07-29 | Discharge: 2015-07-30 | Disposition: A | Discharge status: Home / Self Care | Payer: 59 | Attending: Internal Medicine | Admitting: Internal Medicine

## 2015-07-29 ENCOUNTER — Encounter (HOSPITAL_COMMUNITY): Payer: Self-pay | Admitting: Emergency Medicine

## 2015-07-29 DIAGNOSIS — E1165 Type 2 diabetes mellitus with hyperglycemia: Secondary | ICD-10-CM | POA: Diagnosis not present

## 2015-07-29 DIAGNOSIS — E119 Type 2 diabetes mellitus without complications: Secondary | ICD-10-CM | POA: Diagnosis not present

## 2015-07-29 DIAGNOSIS — I2510 Atherosclerotic heart disease of native coronary artery without angina pectoris: Secondary | ICD-10-CM | POA: Insufficient documentation

## 2015-07-29 DIAGNOSIS — I701 Atherosclerosis of renal artery: Secondary | ICD-10-CM | POA: Insufficient documentation

## 2015-07-29 DIAGNOSIS — I25119 Atherosclerotic heart disease of native coronary artery with unspecified angina pectoris: Secondary | ICD-10-CM | POA: Diagnosis not present

## 2015-07-29 DIAGNOSIS — Z794 Long term (current) use of insulin: Secondary | ICD-10-CM | POA: Diagnosis not present

## 2015-07-29 DIAGNOSIS — M549 Dorsalgia, unspecified: Secondary | ICD-10-CM | POA: Diagnosis present

## 2015-07-29 DIAGNOSIS — R079 Chest pain, unspecified: Principal | ICD-10-CM | POA: Diagnosis present

## 2015-07-29 DIAGNOSIS — Z8673 Personal history of transient ischemic attack (TIA), and cerebral infarction without residual deficits: Secondary | ICD-10-CM | POA: Insufficient documentation

## 2015-07-29 DIAGNOSIS — Z9114 Patient's other noncompliance with medication regimen: Secondary | ICD-10-CM | POA: Insufficient documentation

## 2015-07-29 DIAGNOSIS — I252 Old myocardial infarction: Secondary | ICD-10-CM | POA: Insufficient documentation

## 2015-07-29 DIAGNOSIS — Z7982 Long term (current) use of aspirin: Secondary | ICD-10-CM | POA: Insufficient documentation

## 2015-07-29 DIAGNOSIS — I10 Essential (primary) hypertension: Secondary | ICD-10-CM | POA: Insufficient documentation

## 2015-07-29 DIAGNOSIS — Z853 Personal history of malignant neoplasm of breast: Secondary | ICD-10-CM | POA: Insufficient documentation

## 2015-07-29 DIAGNOSIS — E1159 Type 2 diabetes mellitus with other circulatory complications: Secondary | ICD-10-CM | POA: Diagnosis not present

## 2015-07-29 DIAGNOSIS — Z923 Personal history of irradiation: Secondary | ICD-10-CM | POA: Diagnosis not present

## 2015-07-29 HISTORY — DX: Heart failure, unspecified: I50.9

## 2015-07-29 LAB — COMPREHENSIVE METABOLIC PANEL
ALT: 20 U/L (ref 14–54)
AST: 25 U/L (ref 15–41)
Albumin: 3.9 g/dL (ref 3.5–5.0)
Alkaline Phosphatase: 81 U/L (ref 38–126)
Anion gap: 8 (ref 5–15)
BUN: 15 mg/dL (ref 6–20)
CO2: 24 mmol/L (ref 22–32)
Calcium: 9.7 mg/dL (ref 8.9–10.3)
Chloride: 106 mmol/L (ref 101–111)
Creatinine, Ser: 0.67 mg/dL (ref 0.44–1.00)
GFR calc Af Amer: >60 mL/min (ref >60)
GFR calc non Af Amer: >60 mL/min (ref >60)
Glucose, Bld: 273 mg/dL — ABNORMAL HIGH (ref 65–99)
Potassium: 4.6 mmol/L (ref 3.5–5.1)
Sodium: 138 mmol/L (ref 135–145)
Total Bilirubin: 0.8 mg/dL (ref 0.3–1.2)
Total Protein: 6.1 g/dL — ABNORMAL LOW (ref 6.5–8.1)

## 2015-07-29 LAB — GLUCOSE, CAPILLARY
Glucose-Capillary: 95 mg/dL (ref 65–99)
Glucose-Capillary: 221 mg/dL — ABNORMAL HIGH (ref 65–99)

## 2015-07-29 LAB — I-STAT TROPONIN, ED: Troponin i, poc: 0 ng/mL (ref 0.00–0.08)

## 2015-07-29 LAB — CBC WITH DIFFERENTIAL/PLATELET
Basophils Absolute: 0 10*3/uL (ref 0.0–0.1)
Basophils Relative: 0 %
Eosinophils Absolute: 0.1 10*3/uL (ref 0.0–0.7)
Eosinophils Relative: 1 %
HCT: 46.3 % — ABNORMAL HIGH (ref 36.0–46.0)
Hemoglobin: 15.8 g/dL — ABNORMAL HIGH (ref 12.0–15.0)
Lymphocytes Relative: 11 %
Lymphs Abs: 1 10*3/uL (ref 0.7–4.0)
MCH: 30.4 pg (ref 26.0–34.0)
MCHC: 34.1 g/dL (ref 30.0–36.0)
MCV: 89 fL (ref 78.0–100.0)
Monocytes Absolute: 0.5 10*3/uL (ref 0.1–1.0)
Monocytes Relative: 6 %
Neutro Abs: 7.6 10*3/uL (ref 1.7–7.7)
Neutrophils Relative %: 82 %
Platelets: 191 10*3/uL (ref 150–400)
RBC: 5.2 MIL/uL — ABNORMAL HIGH (ref 3.87–5.11)
RDW: 13.6 % (ref 11.5–15.5)
WBC: 9.2 10*3/uL (ref 4.0–10.5)

## 2015-07-29 LAB — BRAIN NATRIURETIC PEPTIDE: B Natriuretic Peptide: 97.8 pg/mL (ref 0.0–100.0)

## 2015-07-29 LAB — TROPONIN I: Troponin I: <0.03 ng/mL (ref <0.031)

## 2015-07-29 MED ORDER — ATORVASTATIN CALCIUM 80 MG PO TABS
80.0000 mg | ORAL_TABLET | Freq: Every day | ORAL | Status: DC
  Administered 2015-07-29: 80 mg via ORAL
  Filled 2015-07-29 (×2): qty 1

## 2015-07-29 MED ORDER — NITROGLYCERIN 0.4 MG SL SUBL
0.4000 mg | SUBLINGUAL_TABLET | SUBLINGUAL | Status: DC | PRN
  Administered 2015-07-29 (×2): 0.4 mg via SUBLINGUAL
  Filled 2015-07-29: qty 1

## 2015-07-29 MED ORDER — CARVEDILOL 25 MG PO TABS
25.0000 mg | ORAL_TABLET | Freq: Two times a day (BID) | ORAL | Status: DC
  Administered 2015-07-30: 25 mg via ORAL
  Filled 2015-07-29: qty 1

## 2015-07-29 MED ORDER — INSULIN ASPART 100 UNIT/ML ~~LOC~~ SOLN
0.0000 [IU] | Freq: Three times a day (TID) | SUBCUTANEOUS | Status: DC
  Administered 2015-07-30: 2 [IU] via SUBCUTANEOUS

## 2015-07-29 MED ORDER — HEPARIN SODIUM (PORCINE) 5000 UNIT/ML IJ SOLN
5000.0000 [IU] | Freq: Three times a day (TID) | INTRAMUSCULAR | Status: DC
  Administered 2015-07-29 – 2015-07-30 (×2): 5000 [IU] via SUBCUTANEOUS
  Filled 2015-07-29 (×2): qty 1

## 2015-07-29 MED ORDER — CANAGLIFLOZIN 100 MG PO TABS: 100.0000 mg | ORAL_TABLET | Freq: Every day | ORAL | Status: DC

## 2015-07-29 MED ORDER — NITROGLYCERIN 0.4 MG SL SUBL: 0.4000 mg | SUBLINGUAL_TABLET | SUBLINGUAL | Status: DC | PRN

## 2015-07-29 MED ORDER — ASPIRIN EC 81 MG PO TBEC
81.0000 mg | DELAYED_RELEASE_TABLET | Freq: Every day | ORAL | Status: DC
  Administered 2015-07-30: 81 mg via ORAL
  Filled 2015-07-29 (×3): qty 1

## 2015-07-29 MED ORDER — ISOSORBIDE MONONITRATE ER 60 MG PO TB24
60.0000 mg | ORAL_TABLET | Freq: Every day | ORAL | Status: DC
  Administered 2015-07-29 – 2015-07-30 (×2): 60 mg via ORAL
  Filled 2015-07-29 (×3): qty 1

## 2015-07-29 MED ORDER — LISINOPRIL 40 MG PO TABS
40.0000 mg | ORAL_TABLET | Freq: Every day | ORAL | Status: DC
  Administered 2015-07-30: 40 mg via ORAL
  Filled 2015-07-29: qty 1

## 2015-07-29 MED ORDER — HYDRALAZINE HCL 50 MG PO TABS
50.0000 mg | ORAL_TABLET | Freq: Three times a day (TID) | ORAL | Status: DC
  Administered 2015-07-29 – 2015-07-30 (×2): 50 mg via ORAL
  Filled 2015-07-29 (×2): qty 1

## 2015-07-29 MED ORDER — IOHEXOL 350 MG/ML SOLN
100.0000 mL | Freq: Once | INTRAVENOUS | Status: AC | PRN
  Administered 2015-07-29: 100 mL via INTRAVENOUS

## 2015-07-29 MED ORDER — ASPIRIN 81 MG PO CHEW
324.0000 mg | CHEWABLE_TABLET | Freq: Once | ORAL | Status: AC
  Administered 2015-07-29: 243 mg via ORAL
  Filled 2015-07-29: qty 4

## 2015-07-29 MED ORDER — INSULIN ASPART 100 UNIT/ML ~~LOC~~ SOLN
0.0000 [IU] | Freq: Every day | SUBCUTANEOUS | Status: DC
  Administered 2015-07-29: 2 [IU] via SUBCUTANEOUS

## 2015-07-29 NOTE — H&P (Signed)
Patient ID: ADRIAN SPECHT MRN: [id number], DOB/AGE: 1951-04-22   Admit date: 07/29/2015   Primary Physician: Scarlette Calico, MD Primary Cardiologist: Dr. Harrington Challenger  Pt. Profile:  64 y/o female with prior h/o anterior STEMI in 2013, s/p PCI to the LAD, ischemic cardiomyopathy with recent improvement in EF from 20-25% back up to 45-50% on recent echo 07/15/15, HTN, HLD, prior CVA and h/o breast CA, presenting to the ED with sudden onset of upper back pain.   Problem List  Past Medical History  Diagnosis Date  . HTN (hypertension)   . Myocardial infarction 09/07/12  . Breast cancer 12/03/12    a. 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 S/p chemo with CMF and radiation.  Marland Kitchen History of radiation therapy 06/23/2013-08/08/2013    62.4 gray to left breast  . Hypercholesteremia   . Coronary artery disease     a. STEMI 08/2012: s/p DES to LAD, PTCA to downstream LAD. b. Relook cath same hospitalization: stable post-PCI anatomy with Stable Diag lesions (unlikely cause of resting angina, not good PCI targets), stable RCA lesion (non flow limiting).  . Stroke, embolic     a. 75/1700 post cath.  . Peripheral vision loss     left  . Balance problems     due to stroke  . History of cancer chemotherapy     last in August 2014  . GERD (gastroesophageal reflux disease)     food related  . Arthritis     back  . Renal artery stenosis     a. 20% L RAS in 08/2012 by cath. b. Duplex 07/2012: >60% L RAS.  . LV dysfunction     a. EF 45-50% at time of STEMI 08/2012. b. Echo 04/2013: EF 45-50%, mild focal basal hypertrophy of septum, grade 1 d/d.  Marland Kitchen Vasovagal syncope     a. 04/2013 - echo stable compared to prior EF 45-50%, negative carotid duplex.  . Diabetes mellitus without complication     Takes insulin and metformin  . CHF (congestive heart failure)     On Monday she said her cardiologist was concerned for possible CHF, but is now improving-per pt.(07/26/15)    Past  Surgical History  Procedure Laterality Date  . Coronary angioplasty with stent placement    . Breast lumpectomy with needle localization and axillary sentinel lymph node bx Left 01/08/2013    Procedure: LEFT BREAST WIRE GUIDED  LUMPECTOMY AND LEFT AXILLARY SENTINEL  NODE BX;  Surgeon: Rolm Bookbinder, MD;  Location: [facility];  Service: General;  Laterality: Left;  . Portacath placement Right 02/17/2013    Procedure: INSERTION PORT-A-CATH;  Surgeon: Rolm Bookbinder, MD;  Location: [facility];  Service: General;  Laterality: Right;  . Back surgery      x3, lower back  . Cataract extraction Bilateral   . Port-a-cath removal N/A 10/14/2013    Procedure: REMOVAL PORT-A-CATH;  Surgeon: Rolm Bookbinder, MD;  Location: [facility];  Service: General;  Laterality: N/A;  . Left heart catheterization with coronary angiogram N/A 09/07/2012    Procedure: LEFT HEART CATHETERIZATION WITH CORONARY ANGIOGRAM;  Surgeon: Troy Sine, MD;  Location: [facility] CATH LAB;  Service: Cardiovascular;  Laterality: N/A;  . Percutaneous coronary stent intervention (pci-s) N/A 09/07/2012    Procedure: PERCUTANEOUS CORONARY STENT INTERVENTION (PCI-S);  Surgeon: Troy Sine, MD;  Location: [facility] CATH LAB;  Service: Cardiovascular;  Laterality: N/A;  . Left heart catheterization with coronary angiogram  09/09/2012  Procedure: LEFT HEART CATHETERIZATION WITH CORONARY ANGIOGRAM;  Surgeon: Leonie Man, MD;  Location: [facility] CATH LAB;  Service: Cardiovascular;;     Allergies  No Known Allergies  HPI  64 y/o female, followed by Dr. Harrington Challenger. She has a medical history of breast cancer, hypertension, uncontrolled diabetes mellitus, coronary artery disease with prior anterior STEMI resulting in PCI to the LAD in 1914, ICM and embolic CVA in the past.   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 This was in the setting of severe, uncontrolled HTN with BP of 227/119, due  to medication noncompliance. CT of the head showing no evidence of acute intracranial findings but did show old infarct and chronic microvascular disease. An echo was performed which demonstrated no obvious source of embolus, however she was noted to have reduction in her LVEF at 20-25%. This was down from 45-50% in 2014. Subsequently, she underwent a cardiac stress MRI which showed scar in the entire anterior, mid anteroseptal and apical septal walls with minimal ischemia in the mid inferoseptal wall; likely due to HTN and prior chemotherapy. It was recommended not to do a cath, but rather aggressive medical management of her HTN and systolic heart failure. She was placed on an ACE-I and BB and discharged home with a LifVest. F/u limited 2D echo 07/15/15 showed improvement in EF back to 40-45%. She was recently seen by Dr. Harrington Challenger in clinic 3 days ago on 07/26/15. She was felt to be stable from a cardiac standpoint. She was instructed to discontinue her LifeVest. She also has a known parotid mass, currently undergoing w/u.   She presents to the [facility] ED today with complaints of pain between her shoulder blades and mild shortness of breath that began suddenly around 0915 this morning. She denies any radiation of her pain. No chest discomfort. She denies any recent heavy lifting, falls or injury. She notes it feels like a muscle ache, yet her pain is not reproducible with palpation of posterior chest wall. She did not have pain between her shoulder blades with her previous MI. She denies any exacerbating or alleviating factors. She was given 2 SL NTG in the ED w/o relief. She report full medication compliance. HR and BP are decently controlled with SBPs in the 130s-140s.  CT of the chest/abdomin and pelvis is negative for PE and thoracic aortic dissection. Coronary atherosclerosis is present along with evidence of left renal artery stenosis of possible hemodynamic significance. CXR is unremarkable. STAT troponin is  negative. EKG shows NSR with nonspecific ST abnormalities.    Home Medications  Prior to Admission medications   Medication Sig Start Date End Date Taking? Authorizing Caliana Spires  aspirin 81 MG EC tablet Take 1 tablet (81 mg total) by mouth daily. 04/16/15  Yes Barton Dubois, MD  atorvastatin (LIPITOR) 80 MG tablet Take 1 tablet (80 mg total) by mouth daily with supper. 04/16/15  Yes Barton Dubois, MD  canagliflozin (INVOKANA) 100 MG TABS tablet Take 1 tablet (100 mg total) by mouth daily. 06/01/15  Yes Janith Lima, MD  carvedilol (COREG) 25 MG tablet Take 1 tablet (25 mg total) by mouth 2 (two) times daily with a meal. 07/26/15  Yes Fay Records, MD  glucose blood [facility] VERIO) test strip Use TID 10/03/12  Yes Janith Lima, MD  HUMALOG KWIKPEN 100 UNIT/ML KiwkPen INJECT 12 UNITS INTO THE SKIN TID WITH MEALS 05/15/15  Yes Historical  Josetta Wigal, MD  hydrALAZINE (APRESOLINE) 50 MG tablet Take 1 tablet (50 mg total) by mouth 3 (three) times daily. 06/01/15  Yes Burtis Junes, NP  Insulin Glargine (LANTUS SOLOSTAR) 100 UNIT/ML Solostar Pen Inject 18 units [place] at bedtime. 04/16/15  Yes Barton Dubois, MD  isosorbide mononitrate (IMDUR) 60 MG 24 hr tablet Take 1 tablet (60 mg total) by mouth daily. 04/16/15  Yes Barton Dubois, MD  lisinopril (PRINIVIL,ZESTRIL) 40 MG tablet Take 1 tablet (40 mg total) by mouth daily with breakfast. 04/16/15  Yes Barton Dubois, MD  metFORMIN (GLUCOPHAGE XR) 750 MG 24 hr tablet Take 2 tablets (1,500 mg total) by mouth daily with breakfast. 06/01/15  Yes Janith Lima, MD  nitroGLYCERIN (NITROSTAT) 0.4 MG SL tablet Place 0.4 mg under the tongue every 5 (five) minutes as needed for chest pain. 09/13/12  Yes Brett Canales, PA-C    Family History  Family History  Problem Relation Age of Onset  . Cancer Neg Hx   . Diabetes Neg Hx   . Hyperlipidemia Neg Hx   . Kidney disease Neg Hx   . Stroke Neg Hx   . Heart disease Father   . Hypertension Father     Social  History  Social History   Social History  . Marital Status: Married    Spouse Name: [name]  . Number of Children: 2  . Years of Education: college   Occupational History  .     Social History Main Topics  . Smoking status: Never Smoker   . Smokeless tobacco: Never Used  . Alcohol Use: Yes     Comment: socially  . Drug Use: No  . Sexual Activity: Yes   Other Topics Concern  . Not on file   Social History Narrative   Patient is out of work on medical leave and lives at home with husband.    Patient has 2 children and some college education.               Review of Systems General:  No chills, fever, night sweats or weight changes.  Cardiovascular:  No chest pain, dyspnea on exertion, edema, orthopnea, palpitations, paroxysmal nocturnal dyspnea. Dermatological: No rash, lesions/masses Respiratory: No cough, dyspnea Urologic: No hematuria, dysuria Abdominal:   No nausea, vomiting, diarrhea, bright red blood per rectum, melena, or hematemesis Neurologic:  No visual changes, wkns, changes in mental status. All other systems reviewed and are otherwise negative except as noted above.  Physical Exam  Blood pressure 133/76, pulse 72, temperature 98.6 F (37 C), temperature source Oral, resp. rate 17, height 5\' 5"  (1.651 m), weight 146 lb (66.225 kg), SpO2 92 %.  General: Pleasant, NAD Psych: Normal affect. Neuro: Alert and oriented X 3. Moves all extremities spontaneously. HEENT: Normal  Neck: Supple without bruits or JVD. Lungs:  Resp regular and unlabored, CTA. Heart: RRR, no murmurs, + gallop. Abdomen: Soft, non-tender, non-distended, BS + x 4.  Extremities: No clubbing, cyanosis or edema. DP/PT/Radials 2+ and equal bilaterally.  Labs  Troponin (Point of Care Test)  Recent Labs  07/29/15 1039  TROPIPOC 0.00   No results for input(s): CKTOTAL, CKMB, TROPONINI in the last 72 hours. Lab Results  Component Value Date   WBC 9.2 07/29/2015   HGB 15.8* 07/29/2015    HCT 46.3* 07/29/2015   MCV 89.0 07/29/2015   PLT 191 07/29/2015    Recent Labs Lab 07/29/15 1033  NA 138  K 4.6  CL 106  CO2 24  BUN 15  CREATININE 0.67  CALCIUM 9.7  PROT 6.1*  BILITOT 0.8  ALKPHOS 81  ALT 20  AST 25  GLUCOSE 273*   Lab Results  Component Value Date   CHOL 139 06/01/2015   HDL 58.50 06/01/2015   LDLCALC 63 06/01/2015   TRIG 86.0 06/01/2015   No results found for: DDIMER   Radiology/Studies  Dg Chest Port 1 View  07/29/2015   CLINICAL DATA:  Shortness of breath and pain between the shoulder blades beginning this morning. Initial encounter.  EXAM: PORTABLE CHEST 1 VIEW  COMPARISON:  Single view of the chest 05/18/2013.  FINDINGS: The lungs are clear. Heart size is normal. There is no pneumothorax or pleural effusion. No focal bony abnormality.  IMPRESSION: Negative chest.   Electronically Signed   By: Inge Rise M.D.   On: 07/29/2015 11:46   Ct Angio Chest Aorta W/cm &/or Wo/cm  07/29/2015   CLINICAL DATA:  Interscapular pain, shortness of breath since this morning  EXAM: CT ANGIOGRAPHY CHEST, ABDOMEN AND PELVIS  TECHNIQUE: Multidetector CT imaging through the chest, abdomen and pelvis was performed using the standard protocol during bolus administration of intravenous contrast. Multiplanar reconstructed images and MIPs were obtained and reviewed to evaluate the vascular anatomy.  CONTRAST:  139mL OMNIPAQUE IOHEXOL 350 MG/ML SOLN  COMPARISON:  None available  FINDINGS: CHEST  The noncontrast scout shows no mediastinal hematoma or hyperdense crescent. Moderate coronary and scattered aortic arterial calcifications. No pneumothorax.  CTA via right arm IV injection. SVC patent. Mild four-chamber cardiac enlargement. RV/LV ratio remains less than 1, normal. Satisfactory opacification of pulmonary arteries noted, and there is no evidence of pulmonary emboli. Patent bilateral pulmonary veins. Adequate contrast opacification of the thoracic aorta with no evidence  of dissection, aneurysm, or stenosis. There is classic 3-vessel brachiocephalic arch anatomy without proximal stenosis.  No pleural or pericardial effusion. No hilar or mediastinal adenopathy. Mild dependent atelectasis posteriorly in both lower lobes. Lungs otherwise clear. Thoracic spine and sternum intact.  Review of the MIP images confirms the above findings.  ABDOMEN AND PELVIS  Arterial findings:  Aorta: Mild scattered plaque. No aneurysm, dissection, or stenosis.  Celiac axis:         Patent  Superior mesenteric: Patent  Left renal: Ostial plaque resulting in at least mild short-segment stenosis, patent distally.  Right renal:         Single, widely patent  Inferior mesenteric: Patent  Left iliac: Minimal nonocclusive plaque in the common iliac. Otherwise negative.  Right iliac: Minimal scattered plaque in the common iliac. Otherwise negative.  Venous findings: Venous phase imaging not obtained. Patent bilateral renal veins noted.  Review of the MIP images confirms the above findings.  Nonvascular findings: Unremarkable arterial phase evaluation of liver, spleen, adrenal glands, kidneys, pancreas, nondilated gallbladder. Stomach, small bowel and colon are nondilated. Urinary bladder is distended. Uterus and adnexal regions unremarkable. No ascites. Bilateral pelvic phleboliths. No free air. No adenopathy. Postop changes in the lower lumbar spine with spondylitic changes L2-S1.  IMPRESSION: 1. Negative for acute PE or thoracic aortic dissection. 2. Atherosclerosis, including aortoiliac and coronary artery disease. Please note that although the presence of coronary artery calcium documents the presence of coronary artery disease, the severity of this disease and any potential stenosis cannot be assessed on this non-gated CT examination. Assessment for potential risk factor modification, dietary therapy or pharmacologic therapy may be warranted, if clinically indicated. 3. Left renal artery ostial stenosis of  possible hemodynamic significance.  Electronically Signed   By: Lucrezia Europe M.D.   On: 07/29/2015 13:16   Ct Angio Abd/pel W/ And/or W/o  07/29/2015   CLINICAL DATA:  Interscapular pain, shortness of breath since this morning  EXAM: CT ANGIOGRAPHY CHEST, ABDOMEN AND PELVIS  TECHNIQUE: Multidetector CT imaging through the chest, abdomen and pelvis was performed using the standard protocol during bolus administration of intravenous contrast. Multiplanar reconstructed images and MIPs were obtained and reviewed to evaluate the vascular anatomy.  CONTRAST:  122mL OMNIPAQUE IOHEXOL 350 MG/ML SOLN  COMPARISON:  None available  FINDINGS: CHEST  The noncontrast scout shows no mediastinal hematoma or hyperdense crescent. Moderate coronary and scattered aortic arterial calcifications. No pneumothorax.  CTA via right arm IV injection. SVC patent. Mild four-chamber cardiac enlargement. RV/LV ratio remains less than 1, normal. Satisfactory opacification of pulmonary arteries noted, and there is no evidence of pulmonary emboli. Patent bilateral pulmonary veins. Adequate contrast opacification of the thoracic aorta with no evidence of dissection, aneurysm, or stenosis. There is classic 3-vessel brachiocephalic arch anatomy without proximal stenosis.  No pleural or pericardial effusion. No hilar or mediastinal adenopathy. Mild dependent atelectasis posteriorly in both lower lobes. Lungs otherwise clear. Thoracic spine and sternum intact.  Review of the MIP images confirms the above findings.  ABDOMEN AND PELVIS  Arterial findings:  Aorta: Mild scattered plaque. No aneurysm, dissection, or stenosis.  Celiac axis:         Patent  Superior mesenteric: Patent  Left renal: Ostial plaque resulting in at least mild short-segment stenosis, patent distally.  Right renal:         Single, widely patent  Inferior mesenteric: Patent  Left iliac: Minimal nonocclusive plaque in the common iliac. Otherwise negative.  Right iliac: Minimal  scattered plaque in the common iliac. Otherwise negative.  Venous findings: Venous phase imaging not obtained. Patent bilateral renal veins noted.  Review of the MIP images confirms the above findings.  Nonvascular findings: Unremarkable arterial phase evaluation of liver, spleen, adrenal glands, kidneys, pancreas, nondilated gallbladder. Stomach, small bowel and colon are nondilated. Urinary bladder is distended. Uterus and adnexal regions unremarkable. No ascites. Bilateral pelvic phleboliths. No free air. No adenopathy. Postop changes in the lower lumbar spine with spondylitic changes L2-S1.  IMPRESSION: 1. Negative for acute PE or thoracic aortic dissection. 2. Atherosclerosis, including aortoiliac and coronary artery disease. Please note that although the presence of coronary artery calcium documents the presence of coronary artery disease, the severity of this disease and any potential stenosis cannot be assessed on this non-gated CT examination. Assessment for potential risk factor modification, dietary therapy or pharmacologic therapy may be warranted, if clinically indicated. 3. Left renal artery ostial stenosis of possible hemodynamic significance.   Electronically Signed   By: Lucrezia Europe M.D.   On: 07/29/2015 13:16    ECG  NSR with nonspecific ST abnormalities.   Echocardiogram Limited Echo 07/15/15 Study Conclusions  - Left ventricle: The cavity size was normal. Wall thickness was increased in a pattern of mild LVH. Systolic function was mildly to moderately reduced. The estimated ejection fraction was in the range of 40% to 45%. There is akinesis of the mid-apicalanteroseptal myocardium. - Mitral valve: Calcified annulus. Mildly thickened leaflets .  Impressions:  - When compared to prior, EF is improved.    ASSESSMENT AND PLAN  1. Back Pain: patient notes dull ache between shoulder blades that started ~9:00 am. No radiation. No chest pain. CT of chest negative for  dissection and PE. Her  presentation is different from when she presented with her MI in 2013 (nausea + vomiting but no chest or back pain). POC troponin is negative x 1. EKG is unremarkable. Physical exam is also unremarkable w/o reproducible pain with palpation of posterior chest wall. Continue to cycle enzymes for r/o  2. CAD: h/o anterior STEMI in 2013 resulting in PCI to mid LAD. Cardiac stress MRI 03/2015  showed scar in the entire anterior, mid anteroseptal and apical septal walls with minimal ischemia in the mid inferoseptal wall, however cardiac cath was not pursued at that time. Medical therapy was elected. She now presents with upper back pain, although not c/w prior angina. Initial POC troponin is negative and EKG is nonacute. Would recommend cycling troponins x 3 for complete r/o. Continue medical therapy for CAD.  3. HTN: h/o poorly controlled HTN due to medication noncompliance. BP is decently controlled today in the 161W-960A systolic and she now reports compliance with meds. Continue to monitor.   4. H/o Breast CA: in remission. Her last chemo was 2 years ago.   5. Renal Artery Stenosis: known history. Followed by Dr. Donnetta Hutching. Arteriogram has been delayed until parotid w/u is complete.   6. DM:  Poorly controlled. BG in ED is 273 mg/dL. On insulin.   Dispo: if she rules out for MI, can discharge home in the am.   Signed, Lyda Jester, PA-C 07/29/2015, 3:08 PM  I have examined the patient and reviewed assessment and plan and discussed with patient.  Agree with above as stated.  Pain is not typical for angina. Will rule out for MI. She had a recent stress MRI which was essentially negative. If she rules out, she can likely be discharged tomorrow. Would be curious to get Dr. Francesca Oman opinion as well in regards to the sensitivity and specificity of stress MRI. The patient's ejection fraction is improved significantly on medical therapy. She needs to continue with her antihypertensives  therapy to maintain the improvement in her left ventricular function.  VARANASI,JAYADEEP S.

## 2015-07-29 NOTE — ED Notes (Signed)
Patient states pain between shoulder blades, and shortness of breath that started this morning about 0915 this morning.    Patient states was not doing anything this morning when it started.   Patient states nothing makes better or worse.   Patient states didn't take anything for pain at home.

## 2015-07-29 NOTE — ED Notes (Signed)
Pt ambulated to restroom with steady gait and no complaint of dizziness or weakness.

## 2015-07-29 NOTE — ED Provider Notes (Signed)
CSN: [id number]     Arrival date & time 07/29/15  1022 History   First MD Initiated Contact with Patient 07/29/15 1025     Chief Complaint  Patient presents with  . Shortness of Breath     (Consider location/radiation/quality/duration/timing/severity/associated sxs/prior Treatment) HPI Comments: Patient presents to the ER for evaluation of pain between her shoulder blades. Patient reports that she had sudden onset of pain between the shoulder blades which radiates to both arms at 9:15 this morning. Symptoms are accompanied by shortness breath. She was at rest when the pain began. She has not identified any alleviating or exacerbating factors. Patient does have a previous cardiac history including MI. She reports that she had stomach area pain with her previous MI, this is not similar to her cardiac symptoms.  Patient is a 64 y.o. female presenting with shortness of breath.  Shortness of Breath Associated symptoms: no chest pain     Past Medical History  Diagnosis Date  . HTN (hypertension)   . Myocardial infarction 09/07/12  . Breast cancer 12/03/12    a. 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 S/p chemo with CMF and radiation.  [facility] History of radiation therapy 06/23/2013-08/08/2013    62.4 gray to left breast  . Hypercholesteremia   . Coronary artery disease     a. STEMI 08/2012: s/p DES to LAD, PTCA to downstream LAD. b. Relook cath same hospitalization: stable post-PCI anatomy with Stable Diag lesions (unlikely cause of resting angina, not good PCI targets), stable RCA lesion (non flow limiting).  . Stroke, embolic     a. 73/4193 post cath.  . Peripheral vision loss     left  . Balance problems     due to stroke  . History of cancer chemotherapy     last in August 2014  . GERD (gastroesophageal reflux disease)     food related  . Arthritis     back  . Renal artery stenosis     a. 20% L RAS in 08/2012 by cath. b. Duplex 07/2012: >60% L RAS.  . LV  dysfunction     a. EF 45-50% at time of STEMI 08/2012. b. Echo 04/2013: EF 45-50%, mild focal basal hypertrophy of septum, grade 1 d/d.  Marland Kitchen Vasovagal syncope     a. 04/2013 - echo stable compared to prior EF 45-50%, negative carotid duplex.  . Diabetes mellitus without complication     Takes insulin and metformin  . CHF (congestive heart failure)     On Monday she said her cardiologist was concerned for possible CHF, but is now improving-per pt.(07/26/15)   Past Surgical History  Procedure Laterality Date  . Coronary angioplasty with stent placement    . Breast lumpectomy with needle localization and axillary sentinel lymph node bx Left 01/08/2013    Procedure: LEFT BREAST WIRE GUIDED  LUMPECTOMY AND LEFT AXILLARY SENTINEL  NODE BX;  Surgeon: Marisa Bookbinder, MD;  Location: [facility];  Service: General;  Laterality: Left;  . Portacath placement Right 02/17/2013    Procedure: INSERTION PORT-A-CATH;  Surgeon: Marisa Bookbinder, MD;  Location: [facility];  Service: General;  Laterality: Right;  . Back surgery      x3, lower back  . Cataract extraction Bilateral   . Port-a-cath removal N/A 10/14/2013    Procedure: REMOVAL PORT-A-CATH;  Surgeon: Marisa Bookbinder, MD;  Location: [facility];  Service: General;  Laterality: N/A;  . Left heart catheterization with coronary angiogram N/A 09/07/2012  Procedure: LEFT HEART CATHETERIZATION WITH CORONARY ANGIOGRAM;  Surgeon: Marisa Sine, MD;  Location: [facility];  Service: Cardiovascular;  Laterality: N/A;  . Percutaneous coronary stent intervention (pci-s) N/A 09/07/2012    Procedure: PERCUTANEOUS CORONARY STENT INTERVENTION (PCI-S);  Surgeon: Marisa Sine, MD;  Location: [facility];  Service: Cardiovascular;  Laterality: N/A;  . Left heart catheterization with coronary angiogram  09/09/2012    Procedure: LEFT HEART CATHETERIZATION WITH CORONARY ANGIOGRAM;  Surgeon: Marisa Man, MD;  Location: [facility];  Service: Cardiovascular;;   Family History   Problem Relation Age of Onset  . Cancer Neg Hx   . Diabetes Neg Hx   . Hyperlipidemia Neg Hx   . Kidney disease Neg Hx   . Stroke Neg Hx   . Heart disease Father   . Hypertension Father    Social History  Substance Use Topics  . Smoking status: Never Smoker   . Smokeless tobacco: Never Used  . Alcohol Use: Yes     Comment: socially   OB History    No data available     Review of Systems  Respiratory: Positive for shortness of breath.   Cardiovascular: Negative for chest pain.  Musculoskeletal: Positive for back pain.  All other systems reviewed and are negative.     Allergies  Review of patient's allergies indicates no known allergies.  Home Medications   Prior to Admission medications   Medication Sig Start Date End Date Taking? Authorizing [facility]  aspirin 81 MG EC tablet Take 1 tablet (81 mg total) by mouth daily. 04/16/15  Yes Marisa Dubois, MD  atorvastatin (LIPITOR) 80 MG tablet Take 1 tablet (80 mg total) by mouth daily with supper. 04/16/15  Yes Mar[facility] Dubois, MD  canagliflozin (INVOKANA) 100 MG TABS tablet Take 1 tablet (100 mg total) by mouth daily. 06/01/15  Yes Marisa Lima, MD  carvedilol (COREG) 25 MG tablet Take 1 tablet (25 mg total) by mouth 2 (two) times daily with a meal. 07/26/15  Yes Mar[facility] Records, MD  glucose blood [facility] VERIO) test strip Use TID 10/03/12  Yes Marisa Lima, MD  HUMALOG KWIKPEN 100 UNIT/ML KiwkPen INJECT 12 UNITS INTO THE SKIN TID WITH MEALS 05/15/15  Yes Marisa Marisa Handler, MD  hydrALAZINE (APRESOLINE) 50 MG tablet Take 1 tablet (50 mg total) by mouth 3 (three) times daily. 06/01/15  Yes Marisa Junes, NP  Insulin Glargine (LANTUS SOLOSTAR) 100 UNIT/ML Solostar Pen Inject 18 units [facility] at bedtime. 04/16/15  Yes [facility] Dubois, MD  isosorbide mononitrate (IMDUR) 60 MG 24 hr tablet Take 1 tablet (60 mg total) by mouth daily. 04/16/15  Yes [facility] Dubois, MD  lisinopril (PRINIVIL,ZESTRIL) 40 MG tablet Take 1 tablet (40 mg total) by mouth  daily with breakfast. 04/16/15  Yes [facility], MD  metFORMIN (GLUCOPHAGE XR) 750 MG 24 hr tablet Take 2 tablets (1,500 mg total) by mouth daily with breakfast. 06/01/15  Yes Marisa Lima, MD  nitroGLYCERIN (NITROSTAT) 0.4 MG SL tablet Place 0.4 mg under the tongue every 5 (five) minutes as needed for chest pain. 09/13/12  Yes Einar Pheasant Hager, PA-C   BP 146/72 mmHg  Pulse 72  Temp(Src) 97.7 F (36.5 C) (Oral)  Resp 18  Ht 5\' 5"  (1.651 m)  Wt 142 lb 3.2 oz (64.5 kg)  BMI 23.66 kg/m2  SpO2 95% Physical Exam  Constitutional: She is oriented to person, place, and time. She appears well-developed and well-nourished. No distress.  HENT:  Head: Normocephalic and atraumatic.  Right Ear: Hearing normal.  Left Ear: Hearing normal.  Nose: Nose normal.  Mouth/Throat: Oropharynx is clear and moist and mucous membranes are normal.  Eyes: Conjunctivae and EOM are normal. Pupils are equal, round, and reactive to light.  Neck: Normal range of motion. Neck supple.  Cardiovascular: Regular rhythm, S1 normal and S2 normal.  Exam reveals no gallop and no friction rub.   No murmur heard. Pulmonary/Chest: Effort normal and breath sounds normal. No respiratory distress. She exhibits no tenderness.  Abdominal: Soft. Normal appearance and bowel sounds are normal. There is no hepatosplenomegaly. There is no tenderness. There is no rebound, no guarding, no tenderness at McBurney's point and negative Murphy's sign. No hernia.  Musculoskeletal: Normal range of motion.  Neurological: She is alert and oriented to person, place, and time. She has normal strength. No cranial nerve deficit or sensory deficit. Coordination normal. GCS eye subscore is 4. GCS verbal subscore is 5. GCS motor subscore is 6.  Skin: Skin is warm, dry and intact. No rash noted. No cyanosis.  Psychiatric: She has a normal mood and affect. Her speech is normal and behavior is normal. Thought content normal.  Nursing note and vitals  reviewed.   ED Course  Procedures (including critical care time) Labs Review Labs Reviewed  CBC WITH DIFFERENTIAL/PLATELET - Abnormal; Notable for the following:    RBC 5.20 (*)    Hemoglobin 15.8 (*)    HCT 46.3 (*)    All other components within normal limits  COMPREHENSIVE METABOLIC PANEL - Abnormal; Notable for the following:    Glucose, Bld 273 (*)    Total Protein 6.1 (*)    All other components within normal limits  BASIC METABOLIC PANEL - Abnormal; Notable for the following:    Glucose, Bld 147 (*)    All other components within normal limits  GLUCOSE, CAPILLARY - Abnormal; Notable for the following:    Glucose-Capillary 221 (*)    All other components within normal limits  GLUCOSE, CAPILLARY - Abnormal; Notable for the following:    Glucose-Capillary 140 (*)    All other components within normal limits  BRAIN NATRIURETIC PEPTIDE  TROPONIN I  TROPONIN I  TROPONIN I  CBC  GLUCOSE, CAPILLARY  I-STAT TROPOININ, ED    Imaging Review Dg Chest Port 1 View  07/29/2015   CLINICAL DATA:  Shortness of breath and pain between the shoulder blades beginning this morning. Initial encounter.  EXAM: PORTABLE CHEST 1 VIEW  COMPARISON:  Single view of the chest 05/18/2013.  FINDINGS: The lungs are clear. Heart size is normal. There is no pneumothorax or pleural effusion. No focal bony abnormality.  IMPRESSION: Negative chest.   Electronically Signed   By: Inge Rise M.D.   On: 07/29/2015 11:46   Ct Angio Chest Aorta W/cm &/or Wo/cm  07/29/2015   CLINICAL DATA:  Interscapular pain, shortness of breath since this morning  EXAM: CT ANGIOGRAPHY CHEST, ABDOMEN AND PELVIS  TECHNIQUE: Multidetector CT imaging through the chest, abdomen and pelvis was performed using the standard protocol during bolus administration of intravenous contrast. Multiplanar reconstructed images and MIPs were obtained and reviewed to evaluate the vascular anatomy.  CONTRAST:  150mL OMNIPAQUE IOHEXOL 350 MG/ML  SOLN  COMPARISON:  None available  FINDINGS: CHEST  The noncontrast scout shows no mediastinal hematoma or hyperdense crescent. Moderate coronary and scattered aortic arterial calcifications. No pneumothorax.  CTA via right arm IV injection. SVC patent. Mild four-chamber cardiac enlargement. RV/LV ratio  remains less than 1, normal. Satisfactory opacification of pulmonary arteries noted, and there is no evidence of pulmonary emboli. Patent bilateral pulmonary veins. Adequate contrast opacification of the thoracic aorta with no evidence of dissection, aneurysm, or stenosis. There is classic 3-vessel brachiocephalic arch anatomy without proximal stenosis.  No pleural or pericardial effusion. No hilar or mediastinal adenopathy. Mild dependent atelectasis posteriorly in both lower lobes. Lungs otherwise clear. Thoracic spine and sternum intact.  Review of the MIP images confirms the above findings.  ABDOMEN AND PELVIS  Arterial findings:  Aorta: Mild scattered plaque. No aneurysm, dissection, or stenosis.  Celiac axis:         Patent  Superior mesenteric: Patent  Left renal: Ostial plaque resulting in at least mild short-segment stenosis, patent distally.  Right renal:         Single, widely patent  Inferior mesenteric: Patent  Left iliac: Minimal nonocclusive plaque in the common iliac. Otherwise negative.  Right iliac: Minimal scattered plaque in the common iliac. Otherwise negative.  Venous findings: Venous phase imaging not obtained. Patent bilateral renal veins noted.  Review of the MIP images confirms the above findings.  Nonvascular findings: Unremarkable arterial phase evaluation of liver, spleen, adrenal glands, kidneys, pancreas, nondilated gallbladder. Stomach, small bowel and colon are nondilated. Urinary bladder is distended. Uterus and adnexal regions unremarkable. No ascites. Bilateral pelvic phleboliths. No free air. No adenopathy. Postop changes in the lower lumbar spine with spondylitic changes L2-S1.   IMPRESSION: 1. Negative for acute PE or thoracic aortic dissection. 2. Atherosclerosis, including aortoiliac and coronary artery disease. Please note that although the presence of coronary artery calcium documents the presence of coronary artery disease, the severity of this disease and any potential stenosis cannot be assessed on this non-gated CT examination. Assessment for potential risk factor modification, dietary therapy or pharmacologic therapy may be warranted, if clinically indicated. 3. Left renal artery ostial stenosis of possible hemodynamic significance.   Electronically Signed   By: Lucrezia Europe M.D.   On: 07/29/2015 13:16   Ct Angio Abd/pel W/ And/or W/o  07/29/2015   CLINICAL DATA:  Interscapular pain, shortness of breath since this morning  EXAM: CT ANGIOGRAPHY CHEST, ABDOMEN AND PELVIS  TECHNIQUE: Multidetector CT imaging through the chest, abdomen and pelvis was performed using the standard protocol during bolus administration of intravenous contrast. Multiplanar reconstructed images and MIPs were obtained and reviewed to evaluate the vascular anatomy.  CONTRAST:  144mL OMNIPAQUE IOHEXOL 350 MG/ML SOLN  COMPARISON:  None available  FINDINGS: CHEST  The noncontrast scout shows no mediastinal hematoma or hyperdense crescent. Moderate coronary and scattered aortic arterial calcifications. No pneumothorax.  CTA via right arm IV injection. SVC patent. Mild four-chamber cardiac enlargement. RV/LV ratio remains less than 1, normal. Satisfactory opacification of pulmonary arteries noted, and there is no evidence of pulmonary emboli. Patent bilateral pulmonary veins. Adequate contrast opacification of the thoracic aorta with no evidence of dissection, aneurysm, or stenosis. There is classic 3-vessel brachiocephalic arch anatomy without proximal stenosis.  No pleural or pericardial effusion. No hilar or mediastinal adenopathy. Mild dependent atelectasis posteriorly in both lower lobes. Lungs otherwise  clear. Thoracic spine and sternum intact.  Review of the MIP images confirms the above findings.  ABDOMEN AND PELVIS  Arterial findings:  Aorta: Mild scattered plaque. No aneurysm, dissection, or stenosis.  Celiac axis:         Patent  Superior mesenteric: Patent  Left renal: Ostial plaque resulting in at least mild short-segment stenosis, patent distally.  Right renal:         Single, widely patent  Inferior mesenteric: Patent  Left iliac: Minimal nonocclusive plaque in the common iliac. Otherwise negative.  Right iliac: Minimal scattered plaque in the common iliac. Otherwise negative.  Venous findings: Venous phase imaging not obtained. Patent bilateral renal veins noted.  Review of the MIP images confirms the above findings.  Nonvascular findings: Unremarkable arterial phase evaluation of liver, spleen, adrenal glands, kidneys, pancreas, nondilated gallbladder. Stomach, small bowel and colon are nondilated. Urinary bladder is distended. Uterus and adnexal regions unremarkable. No ascites. Bilateral pelvic phleboliths. No free air. No adenopathy. Postop changes in the lower lumbar spine with spondylitic changes L2-S1.  IMPRESSION: 1. Negative for acute PE or thoracic aortic dissection. 2. Atherosclerosis, including aortoiliac and coronary artery disease. Please note that although the presence of coronary artery calcium documents the presence of coronary artery disease, the severity of this disease and any potential stenosis cannot be assessed on this non-gated CT examination. Assessment for potential risk factor modification, dietary therapy or pharmacologic therapy may be warranted, if clinically indicated. 3. Left renal artery ostial stenosis of possible hemodynamic significance.   Electronically Signed   By: Lucrezia Europe M.D.   On: 07/29/2015 13:16   I have personally reviewed and evaluated these images and lab results as part of my medical decision-making.   EKG Interpretation   Date/Time:  Thursday  July 29 2015 10:30:51 EDT Ventricular Rate:  73 PR Interval:  194 QRS Duration: 108 QT Interval:  405 QTC Calculation: 446 R Axis:   -43 Text Interpretation:  Sinus rhythm LAD, consider LAFB or inferior infarct  Anterior infarct, old Abnormal T, consider ischemia, lateral leads No  significant change since last tracing Confirmed by POLLINA  MD,  CHRISTOPHER [phone]) on 07/29/2015 10:37:19 AM      MDM   Final diagnoses:  Back pain  Back pain    Patient presented to the ER for evaluation of back pain. Patient is experiencing pain between the shoulder blades that is associated with shortness of breath. Pain does radiate to both arms. Patient does have an extensive cardiac history and this is considered a possible anginal equivalent for the patient. Initial workup was negative. Cardiology was consulted to further evaluate patient for further treatment and disposition.    Orpah Greek, MD 07/30/15 [phone]

## 2015-07-29 NOTE — ED Notes (Signed)
Attempted report 

## 2015-07-30 DIAGNOSIS — R079 Chest pain, unspecified: Secondary | ICD-10-CM

## 2015-07-30 LAB — BASIC METABOLIC PANEL
Anion gap: 7 (ref 5–15)
BUN: 11 mg/dL (ref 6–20)
CO2: 26 mmol/L (ref 22–32)
Calcium: 9.4 mg/dL (ref 8.9–10.3)
Chloride: 107 mmol/L (ref 101–111)
Creatinine, Ser: 0.65 mg/dL (ref 0.44–1.00)
GFR calc Af Amer: >60 mL/min (ref >60)
GFR calc non Af Amer: >60 mL/min (ref >60)
Glucose, Bld: 147 mg/dL — ABNORMAL HIGH (ref 65–99)
Potassium: 3.9 mmol/L (ref 3.5–5.1)
Sodium: 140 mmol/L (ref 135–145)

## 2015-07-30 LAB — GLUCOSE, CAPILLARY: Glucose-Capillary: 140 mg/dL — ABNORMAL HIGH (ref 65–99)

## 2015-07-30 LAB — CBC
HCT: 43.7 % (ref 36.0–46.0)
Hemoglobin: 14.4 g/dL (ref 12.0–15.0)
MCH: 29.3 pg (ref 26.0–34.0)
MCHC: 33 g/dL (ref 30.0–36.0)
MCV: 89 fL (ref 78.0–100.0)
Platelets: 180 10*3/uL (ref 150–400)
RBC: 4.91 MIL/uL (ref 3.87–5.11)
RDW: 13.7 % (ref 11.5–15.5)
WBC: 4.7 10*3/uL (ref 4.0–10.5)

## 2015-07-30 LAB — TROPONIN I
Troponin I: <0.03 ng/mL (ref <0.031)
Troponin I: <0.03 ng/mL (ref <0.031)

## 2015-07-30 NOTE — Progress Notes (Signed)
Patient Name: Marisa Forbes Date of Encounter: 07/30/2015   SUBJECTIVE  Feeling well. No chest pain, sob or palpitations.   CURRENT MEDS . aspirin EC  81 mg Oral Daily  . atorvastatin  80 mg Oral Q supper  . carvedilol  25 mg Oral BID WC  . heparin  5,000 Units Subcutaneous 3 times per day  . hydrALAZINE  50 mg Oral TID  . insulin aspart  0-15 Units Subcutaneous TID WC  . insulin aspart  0-5 Units Subcutaneous QHS  . isosorbide mononitrate  60 mg Oral Daily  . lisinopril  40 mg Oral Q breakfast    OBJECTIVE  Filed Vitals:   07/29/15 1715 07/29/15 1817 07/29/15 2027 07/30/15 0444  BP: 146/79 149/85 165/86 146/72  Pulse: 67 71 70 72  Temp:  97.8 F (36.6 C)  97.7 F (36.5 C)  TempSrc:  Oral  Oral  Resp: 16 17 18 18   Height:  5\' 5"  (1.651 m)    Weight:  145 lb 15.1 oz (66.2 kg)  142 lb 3.2 oz (64.5 kg)  SpO2: 93% 98% 99% 95%   No intake or output data in the 24 hours ending 07/30/15 0910 Filed Weights   07/29/15 1029 07/29/15 1817 07/30/15 0444  Weight: 146 lb (66.225 kg) 145 lb 15.1 oz (66.2 kg) 142 lb 3.2 oz (64.5 kg)    PHYSICAL EXAM  General: Pleasant, NAD. Neuro: Alert and oriented X 3. Moves all extremities spontaneously. Psych: Normal affect. HEENT:  Normal  Neck: Supple without bruits or JVD. Lungs:  Resp regular and unlabored, CTA. Heart: RRR no s3, s4, or murmurs. Abdomen: Soft, non-tender, non-distended, BS + x 4.  Extremities: No clubbing, cyanosis or edema. DP/PT/Radials 2+ and equal bilaterally.  Accessory Clinical Findings  CBC  Recent Labs  07/29/15 1033 07/30/15 0545  WBC 9.2 4.7  NEUTROABS 7.6  --   HGB 15.8* 14.4  HCT 46.3* 43.7  MCV 89.0 89.0  PLT 191 818   Basic Metabolic Panel  Recent Labs  07/29/15 1033 07/30/15 0545  NA 138 140  K 4.6 3.9  CL 106 107  CO2 24 26  GLUCOSE 273* 147*  BUN 15 11  CREATININE 0.67 0.65  CALCIUM 9.7 9.4   Liver Function Tests  Recent Labs  07/29/15 1033  AST 25  ALT 20    ALKPHOS 81  BILITOT 0.8  PROT 6.1*  ALBUMIN 3.9   No results for input(s): LIPASE, AMYLASE in the last 72 hours. Cardiac Enzymes  Recent Labs  07/29/15 1852 07/30/15 0035 07/30/15 0545  TROPONINI <0.03 <0.03 <0.03    TELE  NSR  Radiology/Studies  Dg Chest Port 1 View  07/29/2015   CLINICAL DATA:  Shortness of breath and pain between the shoulder blades beginning this morning. Initial encounter.  EXAM: PORTABLE CHEST 1 VIEW  COMPARISON:  Single view of the chest 05/18/2013.  FINDINGS: The lungs are clear. Heart size is normal. There is no pneumothorax or pleural effusion. No focal bony abnormality.  IMPRESSION: Negative chest.   Electronically Signed   By: Inge Rise M.D.   On: 07/29/2015 11:46   Ct Angio Chest Aorta W/cm &/or Wo/cm  07/29/2015   CLINICAL DATA:  Interscapular pain, shortness of breath since this morning  EXAM: CT ANGIOGRAPHY CHEST, ABDOMEN AND PELVIS  TECHNIQUE: Multidetector CT imaging through the chest, abdomen and pelvis was performed using the standard protocol during bolus administration of intravenous contrast. Multiplanar reconstructed images and MIPs were obtained and  reviewed to evaluate the vascular anatomy.  CONTRAST:  164mL OMNIPAQUE IOHEXOL 350 MG/ML SOLN  COMPARISON:  None available  FINDINGS: CHEST  The noncontrast scout shows no mediastinal hematoma or hyperdense crescent. Moderate coronary and scattered aortic arterial calcifications. No pneumothorax.  CTA via right arm IV injection. SVC patent. Mild four-chamber cardiac enlargement. RV/LV ratio remains less than 1, normal. Satisfactory opacification of pulmonary arteries noted, and there is no evidence of pulmonary emboli. Patent bilateral pulmonary veins. Adequate contrast opacification of the thoracic aorta with no evidence of dissection, aneurysm, or stenosis. There is classic 3-vessel brachiocephalic arch anatomy without proximal stenosis.  No pleural or pericardial effusion. No hilar or  mediastinal adenopathy. Mild dependent atelectasis posteriorly in both lower lobes. Lungs otherwise clear. Thoracic spine and sternum intact.  Review of the MIP images confirms the above findings.  ABDOMEN AND PELVIS  Arterial findings:  Aorta: Mild scattered plaque. No aneurysm, dissection, or stenosis.  Celiac axis:         Patent  Superior mesenteric: Patent  Left renal: Ostial plaque resulting in at least mild short-segment stenosis, patent distally.  Right renal:         Single, widely patent  Inferior mesenteric: Patent  Left iliac: Minimal nonocclusive plaque in the common iliac. Otherwise negative.  Right iliac: Minimal scattered plaque in the common iliac. Otherwise negative.  Venous findings: Venous phase imaging not obtained. Patent bilateral renal veins noted.  Review of the MIP images confirms the above findings.  Nonvascular findings: Unremarkable arterial phase evaluation of liver, spleen, adrenal glands, kidneys, pancreas, nondilated gallbladder. Stomach, small bowel and colon are nondilated. Urinary bladder is distended. Uterus and adnexal regions unremarkable. No ascites. Bilateral pelvic phleboliths. No free air. No adenopathy. Postop changes in the lower lumbar spine with spondylitic changes L2-S1.  IMPRESSION: 1. Negative for acute PE or thoracic aortic dissection. 2. Atherosclerosis, including aortoiliac and coronary artery disease. Please note that although the presence of coronary artery calcium documents the presence of coronary artery disease, the severity of this disease and any potential stenosis cannot be assessed on this non-gated CT examination. Assessment for potential risk factor modification, dietary therapy or pharmacologic therapy may be warranted, if clinically indicated. 3. Left renal artery ostial stenosis of possible hemodynamic significance.   Electronically Signed   By: Lucrezia Europe M.D.   On: 07/29/2015 13:16   Ct Angio Abd/pel W/ And/or W/o  07/29/2015   CLINICAL DATA:   Interscapular pain, shortness of breath since this morning  EXAM: CT ANGIOGRAPHY CHEST, ABDOMEN AND PELVIS  TECHNIQUE: Multidetector CT imaging through the chest, abdomen and pelvis was performed using the standard protocol during bolus administration of intravenous contrast. Multiplanar reconstructed images and MIPs were obtained and reviewed to evaluate the vascular anatomy.  CONTRAST:  143mL OMNIPAQUE IOHEXOL 350 MG/ML SOLN  COMPARISON:  None available  FINDINGS: CHEST  The noncontrast scout shows no mediastinal hematoma or hyperdense crescent. Moderate coronary and scattered aortic arterial calcifications. No pneumothorax.  CTA via right arm IV injection. SVC patent. Mild four-chamber cardiac enlargement. RV/LV ratio remains less than 1, normal. Satisfactory opacification of pulmonary arteries noted, and there is no evidence of pulmonary emboli. Patent bilateral pulmonary veins. Adequate contrast opacification of the thoracic aorta with no evidence of dissection, aneurysm, or stenosis. There is classic 3-vessel brachiocephalic arch anatomy without proximal stenosis.  No pleural or pericardial effusion. No hilar or mediastinal adenopathy. Mild dependent atelectasis posteriorly in both lower lobes. Lungs otherwise clear. Thoracic spine and sternum  intact.  Review of the MIP images confirms the above findings.  ABDOMEN AND PELVIS  Arterial findings:  Aorta: Mild scattered plaque. No aneurysm, dissection, or stenosis.  Celiac axis:         Patent  Superior mesenteric: Patent  Left renal: Ostial plaque resulting in at least mild short-segment stenosis, patent distally.  Right renal:         Single, widely patent  Inferior mesenteric: Patent  Left iliac: Minimal nonocclusive plaque in the common iliac. Otherwise negative.  Right iliac: Minimal scattered plaque in the common iliac. Otherwise negative.  Venous findings: Venous phase imaging not obtained. Patent bilateral renal veins noted.  Review of the MIP images  confirms the above findings.  Nonvascular findings: Unremarkable arterial phase evaluation of liver, spleen, adrenal glands, kidneys, pancreas, nondilated gallbladder. Stomach, small bowel and colon are nondilated. Urinary bladder is distended. Uterus and adnexal regions unremarkable. No ascites. Bilateral pelvic phleboliths. No free air. No adenopathy. Postop changes in the lower lumbar spine with spondylitic changes L2-S1.  IMPRESSION: 1. Negative for acute PE or thoracic aortic dissection. 2. Atherosclerosis, including aortoiliac and coronary artery disease. Please note that although the presence of coronary artery calcium documents the presence of coronary artery disease, the severity of this disease and any potential stenosis cannot be assessed on this non-gated CT examination. Assessment for potential risk factor modification, dietary therapy or pharmacologic therapy may be warranted, if clinically indicated. 3. Left renal artery ostial stenosis of possible hemodynamic significance.   Electronically Signed   By: Lucrezia Europe M.D.   On: 07/29/2015 13:16    ASSESSMENT AND PLAN Active Problems:   Chest pain, rule out acute myocardial infarction    Plan: Trop x 3 negative. No further pain. No anginal pain. EKG is unremarkable. Physical exam is also unremarkable w/o reproducible pain with palpation of posterior chest wall. EF improved on recent echo to 45-50%. Address importance of compliance of medications. Will discharge today with outpatient follow up with Dr. Harrington Challenger.   Signed, Bhagat,Bhavinkumar PA-C   Agree with note by Robbie Lis PA-C  Pt admitted with atyp back pain. H/O LAD infarct. Sx not similar to that. Pain resolved spontaneously. Enz neg. EKG w/o acute changes. Chest CTA no dissection or PE. Exam benign. EF improved by 2D several weeks ago. OK for DC home this AM. F/U as OP with Dr. Harrington Challenger.   Lorretta Harp, M.D., [place], [facility], Laverta [place] [address] [place], [place]  [address]  [phone] 07/30/2015 9:16 AM

## 2015-07-30 NOTE — Discharge Summary (Signed)
Discharge Summary   Patient ID: CLODAGH ODENTHAL,  MRN: [id number], DOB/AGE: [birth year] 64 y.o.  Admit date: 07/29/2015 Discharge date: 07/30/2015  Primary Care Provider: Scarlette Calico Primary Cardiologist: Dr. Harrington Challenger  Discharge Diagnoses Active Problems:   Chest pain, rule out acute myocardial infarction   Allergies No Known Allergies  Procedures: None  History of Present Illness  64 y/o female, followed by Dr. Harrington Challenger. 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 This was in the setting of severe, uncontrolled HTN with BP of 227/119, due to medication noncompliance. CT of the head showing no evidence of acute intracranial findings but did show old infarct and chronic microvascular disease. An echo was performed which demonstrated no obvious source of embolus, however she was noted to have reduction in her LVEF at 20-25%. This was down from 45-50% in 2014. Subsequently, she underwent a cardiac stress MRI which showed scar in the entire anterior, mid anteroseptal and apical septal walls with minimal ischemia in the mid inferoseptal wall; likely due to HTN and prior chemotherapy. It was recommended not to do a cath, but rather aggressive medical management of her HTN and systolic heart failure. She was placed on an ACE-I and BB and discharged home with a LifVest. F/u limited 2D echo 07/15/15 showed improvement in EF back to 40-45%. She was recently seen by Dr. Harrington Challenger in clinic  on 07/26/15. She was felt to be stable from a cardiac standpoint. She was instructed to discontinue her LifeVest. She also has a known parotid mass, currently undergoing w/u.   She presented 07/29/15 to the [facility] ED  with complaints of pain between  her shoulder blades and mild shortness of breath that began suddenly around 0915 in morning. She denied any radiation of her pain. No chest discomfort. She denied any recent heavy lifting, falls or injury. She notes it feels like a muscle ache, yet her pain was not reproducible with palpation of posterior chest wall. She did not have pain between her shoulder blades with her previous MI. She denies any exacerbating or alleviating factors. She was given 2 SL NTG in the ED w/o relief. She reported full medication compliance. HR and BP are decently controlled with SBPs in the 130s-140s. CT of the chest/abdomin and pelvis is negative for PE and thoracic aortic dissection. Coronary atherosclerosis is present along with evidence of left renal artery stenosis of possible hemodynamic significance. CXR is unremarkable. STAT troponin is negative. EKG shows NSR with nonspecific ST abnormalities.   She has known Renal Artery Stenosis:  Followed by Dr. Donnetta Hutching. Arteriogram has been delayed until parotid w/u is complete  Hospital Course  She was admitted for observation. Troponin x 3 was negative. No further chest pain. Her symptoms was not similar to prior cardiac event.  She was placed on SSI. Last A1c 10.8. Chest CTA no dissection or PE. Exam benign. EF improved by 2D several weeks ago.  She has been seen by Dr. Gwenlyn Found  today and deemed ready for discharge home. All follow-up appointments have been scheduled. No change in home meds. Advice to follow up with PCP for high BS.    Discharge Vitals Blood pressure 146/72, pulse 72, temperature 97.7 F (36.5 C),  temperature source Oral, resp. rate 18, height 5\' 5"  (1.651 m), weight 142 lb 3.2 oz (64.5 kg), SpO2 95 %.  Filed Weights   07/29/15 1029 07/29/15 1817 07/30/15 0444  Weight: 146 lb (66.225 kg) 145 lb 15.1 oz (66.2 kg) 142 lb 3.2 oz (64.5 kg)    Labs  CBC  Recent Labs  07/29/15 1033 07/30/15 0545  WBC 9.2 4.7  NEUTROABS 7.6  --   HGB 15.8* 14.4    HCT 46.3* 43.7  MCV 89.0 89.0  PLT 191 892   Basic Metabolic Panel  Recent Labs  07/29/15 1033 07/30/15 0545  NA 138 140  K 4.6 3.9  CL 106 107  CO2 24 26  GLUCOSE 273* 147*  BUN 15 11  CREATININE 0.67 0.65  CALCIUM 9.7 9.4   Liver Function Tests  Recent Labs  07/29/15 1033  AST 25  ALT 20  ALKPHOS 81  BILITOT 0.8  PROT 6.1*  ALBUMIN 3.9   Cardiac Enzymes  Recent Labs  07/29/15 1852 07/30/15 0035 07/30/15 0545  TROPONINI <0.03 <0.03 <0.03    Disposition  Pt is being discharged home today in good condition.  Follow-up Plans & Appointments  Follow-up Information    Follow up with [facility], NP. Go on 08/16/2015.   Specialties:  Cardiology, Radiology   Why:  @8 :00 am    Contact information:   [place] [address] [phone]       Discharge Instructions    Diet - low sodium heart healthy    Complete by:  As directed      Increase activity slowly    Complete by:  As directed            F/u Labs/Studies: None  Discharge Medications    Medication List    TAKE these medications        aspirin 81 MG EC tablet  Take 1 tablet (81 mg total) by mouth daily.     atorvastatin 80 MG tablet  Commonly known as:  LIPITOR  Take 1 tablet (80 mg total) by mouth daily with supper.     canagliflozin 100 MG Tabs tablet  Commonly known as:  INVOKANA  Take 1 tablet (100 mg total) by mouth daily.     carvedilol 25 MG tablet  Commonly known as:  COREG  Take 1 tablet (25 mg total) by mouth 2 (two) times daily with a meal.     glucose blood test strip  Commonly known as:  ONETOUCH VERIO  Use TID     HUMALOG KWIKPEN 100 UNIT/ML KiwkPen  Generic drug:  insulin lispro  INJECT 12 UNITS INTO THE SKIN TID WITH MEALS     hydrALAZINE 50 MG tablet  Commonly known as:  APRESOLINE  Take 1 tablet (50 mg total) by mouth 3 (three) times daily.     Insulin Glargine 100 UNIT/ML Solostar Pen  Commonly known as:  LANTUS  SOLOSTAR  Inject 18 units [place] at bedtime.     isosorbide mononitrate 60 MG 24 hr tablet  Commonly known as:  IMDUR  Take 1 tablet (60 mg total) by mouth daily.     lisinopril 40 MG tablet  Commonly known as:  PRINIVIL,ZESTRIL  Take 1 tablet (40 mg total) by mouth daily with breakfast.     metFORMIN 750 MG 24 hr tablet  Commonly known as:  GLUCOPHAGE XR  Take 2 tablets (1,500 mg total) by mouth daily with breakfast.  nitroGLYCERIN 0.4 MG SL tablet  Commonly known as:  NITROSTAT  Place 0.4 mg under the tongue every 5 (five) minutes as needed for chest pain.        Duration of Discharge Encounter   Greater than 30 minutes including physician time.  Signed, Bhagat,Bhavinkumar PA-C 07/30/2015, 9:18 AM

## 2015-08-03 NOTE — Progress Notes (Signed)
Need records from Dr Wilburn Cornelia regarding parotid mass.

## 2015-08-04 NOTE — Progress Notes (Signed)
Sent staff message to HIM to request records.

## 2015-08-12 ENCOUNTER — Encounter: Payer: Self-pay | Admitting: *Deleted

## 2015-08-13 NOTE — Progress Notes (Signed)
Records received from [facility] ([facility]  Will have scanned into chart for Dr. Harrington Challenger review.

## 2015-08-16 ENCOUNTER — Encounter: Payer: 59 | Admitting: Cardiology

## 2015-09-02 ENCOUNTER — Ambulatory Visit: Payer: BLUE CROSS/BLUE SHIELD | Admitting: Internal Medicine

## 2015-10-14 ENCOUNTER — Ambulatory Visit (INDEPENDENT_AMBULATORY_CARE_PROVIDER_SITE_OTHER): Payer: 59 | Admitting: Internal Medicine

## 2015-10-14 ENCOUNTER — Encounter: Payer: Self-pay | Admitting: Internal Medicine

## 2015-10-14 VITALS — BP 130/68 | HR 85 | Ht 65.0 in | Wt 144.8 lb

## 2015-10-14 DIAGNOSIS — I159 Secondary hypertension, unspecified: Secondary | ICD-10-CM | POA: Diagnosis not present

## 2015-10-14 DIAGNOSIS — I5022 Chronic systolic (congestive) heart failure: Secondary | ICD-10-CM

## 2015-10-14 NOTE — Progress Notes (Signed)
Cardiology Office Note   Date:  10/14/2015   ID:  Marisa Forbes, DOB [birth year], MRN [id number]  PCP:  Scarlette Calico, MD  Cardiologist:   Dorris Carnes, MD   F/U of HTN and CAD     History of Present Illness: Marisa Forbes is a 64 y.o. female with a history of HTN, breast CA, DM, CAD and embolic CVA. She was last in clinic on Jun 01, 2015 When hospitalized this summer echo showed LVEF 20 to 25% She was sent home with a Life Vest   Since d/c she denies CP Breathing is OK No palpitations She still gets tired. Echo done on 9/15 LVEF 40 to 45%  Pt in clinic in September    Went to ER later in fall for CP    Felt noncardiac    Breathing OK  No CP recnetly    LDL in Aug was 63  HDL was 59    Current Outpatient Prescriptions  Medication Sig Dispense Refill  . aspirin 81 MG EC tablet Take 1 tablet (81 mg total) by mouth daily. 30 tablet 12  . atorvastatin (LIPITOR) 80 MG tablet Take 1 tablet (80 mg total) by mouth daily with supper. 90 tablet 3  . canagliflozin (INVOKANA) 100 MG TABS tablet Take 1 tablet (100 mg total) by mouth daily. 100 tablet 0  . carvedilol (COREG) 25 MG tablet Take 1 tablet (25 mg total) by mouth 2 (two) times daily with a meal. 60 tablet 11  . glucose blood (ONETOUCH VERIO) test strip Use TID 100 each 12  . HUMALOG KWIKPEN 100 UNIT/ML KiwkPen INJECT 12 UNITS INTO THE SKIN TID WITH MEALS  1  . hydrALAZINE (APRESOLINE) 50 MG tablet Take 1 tablet (50 mg total) by mouth 3 (three) times daily. 270 tablet 3  . Insulin Glargine (LANTUS SOLOSTAR) 100 UNIT/ML Solostar Pen Inject 18 units Cass[place] at bedtime. 1 pen 3  . isosorbide mononitrate (IMDUR) 60 MG 24 hr tablet Take 1 tablet (60 mg total) by mouth daily. 90 tablet 3  . lisinopril (PRINIVIL,ZESTRIL) 40 MG tablet Take 1 tablet (40 mg total) by mouth daily with breakfast. 90 tablet 3  . metFORMIN (GLUCOPHAGE XR) 750 MG 24 hr tablet Take 2 tablets (1,500 mg total) by mouth daily with breakfast. 180 tablet 1  .  nitroGLYCERIN (NITROSTAT) 0.4 MG SL tablet Place 0.4 mg under the tongue every 5 (five) minutes as needed for chest pain.     No current facility-administered medications for this visit.    Allergies:   Review of patient's allergies indicates no known allergies.   Past Medical History  Diagnosis Date  . HTN (hypertension)   . Myocardial infarction ([facility]) 09/07/12  . Breast cancer ([place]) 12/03/12    a. 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 S/p chemo with CMF and radiation.  Marland Kitchen History of radiation therapy 06/23/2013-08/08/2013    62.4 gray to left breast  . Hypercholesteremia   . Coronary artery disease     a. STEMI 08/2012: s/p DES to LAD, PTCA to downstream LAD. b. Relook cath same hospitalization: stable post-PCI anatomy with Stable Diag lesions (unlikely cause of resting angina, not good PCI targets), stable RCA lesion (non flow limiting).  . Stroke, embolic ([facility])     a. [id number] post cath.  . Peripheral vision loss     left  . Balance problems     due to stroke  . History of cancer chemotherapy  last in August 2014  . GERD (gastroesophageal reflux disease)     food related  . Arthritis     back  . Renal artery stenosis ([facility])     a. 20% L RAS in 08/2012 by cath. b. Duplex 07/2012: >60% L RAS.  . LV dysfunction     a. EF 45-50% at time of STEMI 08/2012. b. Echo 04/2013: EF 45-50%, mild focal basal hypertrophy of septum, grade 1 d/d.  Marland Kitchen Vasovagal syncope     a. 04/2013 - echo stable compared to prior EF 45-50%, negative carotid duplex.  . Diabetes mellitus without complication ([place])     Takes insulin and metformin  . CHF (congestive heart failure) ([place])     On Monday she said her cardiologist was concerned for possible CHF, but is now improving-per pt.(07/26/15)    Past Surgical History  Procedure Laterality Date  . Coronary angioplasty with stent placement    . Breast lumpectomy with needle localization and axillary sentinel lymph node bx Left  01/08/2013    Procedure: LEFT BREAST WIRE GUIDED  LUMPECTOMY AND LEFT AXILLARY SENTINEL  NODE BX;  Surgeon: Rolm Bookbinder, MD;  Location: [facility];  Service: General;  Laterality: Left;  . Portacath placement Right 02/17/2013    Procedure: INSERTION PORT-A-CATH;  Surgeon: Rolm Bookbinder, MD;  Location: [facility];  Service: General;  Laterality: Right;  . Back surgery      x3, lower back  . Cataract extraction Bilateral   . Port-a-cath removal N/A 10/14/2013    Procedure: REMOVAL PORT-A-CATH;  Surgeon: Rolm Bookbinder, MD;  Location: [facility];  Service: General;  Laterality: N/A;  . Left heart catheterization with coronary angiogram N/A 09/07/2012    Procedure: LEFT HEART CATHETERIZATION WITH CORONARY ANGIOGRAM;  Surgeon: Troy Sine, MD;  Location: [facility] CATH LAB;  Service: Cardiovascular;  Laterality: N/A;  . Percutaneous coronary stent intervention (pci-s) N/A 09/07/2012    Procedure: PERCUTANEOUS CORONARY STENT INTERVENTION (PCI-S);  Surgeon: Troy Sine, MD;  Location: [facility] CATH LAB;  Service: Cardiovascular;  Laterality: N/A;  . Left heart catheterization with coronary angiogram  09/09/2012    Procedure: LEFT HEART CATHETERIZATION WITH CORONARY ANGIOGRAM;  Surgeon: Leonie Man, MD;  Location: [facility] CATH LAB;  Service: Cardiovascular;;     Social History:  The patient  reports that she has never smoked. She has never used smokeless tobacco. She reports that she drinks alcohol. She reports that she does not use illicit drugs.   Family History:  The patient's family history includes Heart disease in her father; Hypertension in her father. There is no history of Cancer, Diabetes, Hyperlipidemia, Kidney disease, or Stroke.    ROS:  Please see the history of present illness. All other systems are reviewed and  Negative to the above problem except as noted.    PHYSICAL EXAM: VS:  BP 130/68 mmHg  Pulse 85  Ht 5\' 5"  (1.651 m)  Wt 65.681 kg (144 lb 12.8 oz)  BMI 24.10 kg/m2  SpO2 98%  GEN:  Well nourished, well developed, in no acute distress HEENT: normal Neck: no JVD, carotid bruits, or masses Cardiac: RRR; no murmurs, rubs, or gallops,no edema  Respiratory:  clear to auscultation bilaterally, normal work of breathing GI: soft, nontender, nondistended, + BS  No hepatomegaly  MS: no deformity Moving all extremities   Skin: warm and dry, no rash Neuro:  Strength and sensation are intact Psych: euthymic mood, full affect   EKG:  EKG is not ordered today.  Lipid Panel    Component Value Date/Time   CHOL 139 06/01/2015 0854   TRIG 86.0 06/01/2015 0854   HDL 58.50 06/01/2015 0854   CHOLHDL 2 06/01/2015 0854   VLDL 17.2 06/01/2015 0854   LDLCALC 63 06/01/2015 0854      Wt Readings from Last 3 Encounters:  10/14/15 65.681 kg (144 lb 12.8 oz)  07/30/15 64.5 kg (142 lb 3.2 oz)  07/26/15 66.225 kg (146 lb)      ASSESSMENT AND PLAN:  1  CHF Volume status is OK  Will prob get ech next summer    2.  CAD  No sympotms to suggest angina   3  HL  COntinue lipitor  4  RAS  BP is good today  Follow  Contuinue current meds     F/U in 6 months    Signed, Dorris Carnes, MD  10/14/2015 3:47 PM    [facility] [address], [place], [place]  [address] Phone: [phone]; Fax: [phone]

## 2015-10-14 NOTE — Patient Instructions (Signed)
Your physician recommends that you continue on your current medications as directed. Please refer to the Current Medication list given to you today. Your physician wants you to follow-up in: 6 months with Dr. Ross.  You will receive a reminder letter in the mail two months in advance. If you don't receive a letter, please call our office to schedule the follow-up appointment.  

## 2015-10-15 ENCOUNTER — Ambulatory Visit: Payer: 59 | Admitting: Internal Medicine

## 2015-12-03 ENCOUNTER — Telehealth: Payer: Self-pay

## 2015-12-03 NOTE — Telephone Encounter (Signed)
Phone call to pt. Re: need to schedule angiogram; last evaluated at [facility] 05/2015 and Aortogram recommended for poss. Renal artery stenosis; left voice message to call back to discuss.

## 2015-12-08 ENCOUNTER — Other Ambulatory Visit: Payer: Self-pay | Admitting: Internal Medicine

## 2015-12-08 DIAGNOSIS — Z853 Personal history of malignant neoplasm of breast: Secondary | ICD-10-CM

## 2015-12-17 ENCOUNTER — Ambulatory Visit
Admission: RE | Admit: 2015-12-17 | Discharge: 2015-12-17 | Disposition: A | Discharge status: Home / Self Care | Payer: 59 | Source: Ambulatory Visit | Attending: Internal Medicine | Admitting: Internal Medicine

## 2015-12-17 DIAGNOSIS — Z853 Personal history of malignant neoplasm of breast: Secondary | ICD-10-CM

## 2015-12-17 LAB — HM MAMMOGRAPHY

## 2016-01-11 ENCOUNTER — Telehealth: Payer: Self-pay

## 2016-01-11 NOTE — Telephone Encounter (Signed)
LVM for pt to call back as soon as possible.   RE: Flu Vaccine 

## 2016-04-07 ENCOUNTER — Telehealth: Payer: Self-pay | Admitting: Internal Medicine

## 2016-04-07 ENCOUNTER — Encounter: Payer: Self-pay | Admitting: Internal Medicine

## 2016-04-07 ENCOUNTER — Ambulatory Visit (INDEPENDENT_AMBULATORY_CARE_PROVIDER_SITE_OTHER): Payer: 59 | Admitting: Internal Medicine

## 2016-04-07 VITALS — BP 148/88 | HR 77 | Ht 65.0 in | Wt 151.0 lb

## 2016-04-07 DIAGNOSIS — E785 Hyperlipidemia, unspecified: Secondary | ICD-10-CM

## 2016-04-07 DIAGNOSIS — R0602 Shortness of breath: Secondary | ICD-10-CM | POA: Diagnosis not present

## 2016-04-07 DIAGNOSIS — I420 Dilated cardiomyopathy: Secondary | ICD-10-CM

## 2016-04-07 DIAGNOSIS — I10 Essential (primary) hypertension: Secondary | ICD-10-CM

## 2016-04-07 DIAGNOSIS — Z79899 Other long term (current) drug therapy: Secondary | ICD-10-CM

## 2016-04-07 LAB — LIPID PANEL
Cholesterol: 244 mg/dL — ABNORMAL HIGH (ref 125–200)
HDL: 49 mg/dL (ref >=46)
LDL Cholesterol: 137 mg/dL — ABNORMAL HIGH (ref <130)
Total CHOL/HDL Ratio: 5 Ratio (ref <=5.0)
Triglycerides: 291 mg/dL — ABNORMAL HIGH (ref <150)
VLDL: 58 mg/dL — ABNORMAL HIGH (ref <30)

## 2016-04-07 LAB — CBC
HCT: 45.4 % — ABNORMAL HIGH (ref 35.0–45.0)
Hemoglobin: 15.5 g/dL (ref 11.7–15.5)
MCH: 29.6 pg (ref 27.0–33.0)
MCHC: 34.1 g/dL (ref 32.0–36.0)
MCV: 86.8 fL (ref 80.0–100.0)
MPV: 10.3 fL (ref 7.5–12.5)
Platelets: 210 10*3/uL (ref 140–400)
RBC: 5.23 MIL/uL — ABNORMAL HIGH (ref 3.80–5.10)
RDW: 13.6 % (ref 11.0–15.0)
WBC: 5.7 10*3/uL (ref 3.8–10.8)

## 2016-04-07 LAB — BASIC METABOLIC PANEL
BUN: 17 mg/dL (ref 7–25)
CO2: 23 mmol/L (ref 20–31)
Calcium: 9.2 mg/dL (ref 8.6–10.4)
Chloride: 99 mmol/L (ref 98–110)
Creat: 0.81 mg/dL (ref 0.50–0.99)
Glucose, Bld: 541 mg/dL (ref 65–99)
Potassium: 4.2 mmol/L (ref 3.5–5.3)
Sodium: 134 mmol/L — ABNORMAL LOW (ref 135–146)

## 2016-04-07 LAB — BRAIN NATRIURETIC PEPTIDE: Brain Natriuretic Peptide: 38 pg/mL (ref <100)

## 2016-04-07 MED ORDER — LISINOPRIL 40 MG PO TABS: 40.0000 mg | ORAL_TABLET | Freq: Every day | ORAL | Status: DC

## 2016-04-07 MED ORDER — CARVEDILOL 25 MG PO TABS: 25.0000 mg | ORAL_TABLET | Freq: Two times a day (BID) | ORAL | Status: DC

## 2016-04-07 MED ORDER — EZETIMIBE 10 MG PO TABS: 10.0000 mg | ORAL_TABLET | Freq: Every day | ORAL | Status: DC

## 2016-04-07 MED ORDER — ISOSORBIDE MONONITRATE ER 60 MG PO TB24: 60.0000 mg | ORAL_TABLET | Freq: Every day | ORAL | Status: DC

## 2016-04-07 MED ORDER — HYDRALAZINE HCL 50 MG PO TABS: 50.0000 mg | ORAL_TABLET | Freq: Three times a day (TID) | ORAL | Status: DC

## 2016-04-07 NOTE — Telephone Encounter (Signed)
New Message  RN- Randell Loop calling to report critical lab.

## 2016-04-07 NOTE — Telephone Encounter (Addendum)
Spoke with MD - she would advise adding zetia 10mg  in addition to lipitor - repeat lipids in 8 weeks Consult PCP regarding elevated glucose.   Attempted to contact patient - LMTCB  Results routed to PCP in regards to glucose level

## 2016-04-07 NOTE — Progress Notes (Signed)
Cardiology Office Note   Date:  04/07/2016   ID:  Marisa Forbes, DOB May 16, 1951, MRN [id number]  PCP:  Scarlette Calico, MD  Cardiologist:   Dorris Carnes, MD   No chief complaint on file.  F/U of CAD      History of Present Illness: Marisa Forbes is a 65 y.o. female with a history of HTN, breast CA, DM, CAD and embolic CVA. At one point  LVEF 20 to 25%  Echo done on 9/15 LVEF 40 to 45%  I saw her in December 2016  Pt has some neck/shoulder discomfort  Weak feeling  Like aching  Not associated with activity Got a little choked during supper  COuldnt catch breath  Went away. No CP with activity      Outpatient Prescriptions Prior to Visit  Medication Sig Dispense Refill  . aspirin 81 MG EC tablet Take 1 tablet (81 mg total) by mouth daily. 30 tablet 12  . atorvastatin (LIPITOR) 80 MG tablet Take 1 tablet (80 mg total) by mouth daily with supper. 90 tablet 3  . canagliflozin (INVOKANA) 100 MG TABS tablet Take 1 tablet (100 mg total) by mouth daily. 100 tablet 0  . carvedilol (COREG) 25 MG tablet Take 1 tablet (25 mg total) by mouth 2 (two) times daily with a meal. 60 tablet 11  . glucose blood (ONETOUCH VERIO) test strip Use TID 100 each 12  . HUMALOG KWIKPEN 100 UNIT/ML KiwkPen INJECT 12 UNITS INTO THE SKIN TID WITH MEALS  1  . hydrALAZINE (APRESOLINE) 50 MG tablet Take 1 tablet (50 mg total) by mouth 3 (three) times daily. 270 tablet 3  . Insulin Glargine (LANTUS SOLOSTAR) 100 UNIT/ML Solostar Pen Inject 18 units  at bedtime. 1 pen 3  . isosorbide mononitrate (IMDUR) 60 MG 24 hr tablet Take 1 tablet (60 mg total) by mouth daily. 90 tablet 3  . lisinopril (PRINIVIL,ZESTRIL) 40 MG tablet Take 1 tablet (40 mg total) by mouth daily with breakfast. 90 tablet 3  . metFORMIN (GLUCOPHAGE XR) 750 MG 24 hr tablet Take 2 tablets (1,500 mg total) by mouth daily with breakfast. 180 tablet 1  . nitroGLYCERIN (NITROSTAT) 0.4 MG SL tablet Place 0.4 mg under the tongue every 5 (five) minutes  as needed for chest pain.     No facility-administered medications prior to visit.     Allergies:   Review of patient's allergies indicates no known allergies.   Past Medical History  Diagnosis Date  . HTN (hypertension)   . Myocardial infarction ([place]) 09/07/12  . Breast cancer ([place]) 12/03/12    a. 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 S/p chemo with CMF and radiation.  Marland Kitchen History of radiation therapy 06/23/2013-08/08/2013    62.4 gray to left breast  . Hypercholesteremia   . Coronary artery disease     a. STEMI 08/2012: s/p DES to LAD, PTCA to downstream LAD. b. Relook cath same hospitalization: stable post-PCI anatomy with Stable Diag lesions (unlikely cause of resting angina, not good PCI targets), stable RCA lesion (non flow limiting).  . 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 [id number] post cath.  . Peripheral vision loss     left  . Balance problems     due to stroke  . History of cancer chemotherapy     last in August 2014  . GERD (gastroesophageal reflux disease)     food related  . Arthritis     back  .  Renal artery stenosis (H[facility])     a. 20% L RAS in 08/2012 by cath. b. Duplex 07/2012: >60% L RAS.  . LV dysfunction     a. EF 45-50% at time of STEMI 08/2012. b. Echo 04/2013: EF 45-50%, mild focal basal hypertrophy of septum, grade 1 d/d.  Marland Kitchen Vasovagal syncope     a. 04/2013 - echo stable compared to prior EF 45-50%, negative carotid duplex.  . Diabetes mellitus without complication ([place])     Takes insulin and metformin  . CHF (congestive heart failure) ([place])     On Monday she said her cardiologist was concerned for possible CHF, but is now improving-per pt.(07/26/15)    Past Surgical History  Procedure Laterality Date  . Coronary angioplasty with stent placement    . Breast lumpectomy with needle localization and axillary sentinel lymph node bx Left 01/08/2013    Procedure: LEFT BREAST WIRE GUIDED  LUMPECTOMY AND LEFT AXILLARY SENTINEL  NODE BX;   Surgeon: Rolm Bookbinder, MD;  Location: [facility];  Service: General;  Laterality: Left;  . Portacath placement Right 02/17/2013    Procedure: INSERTION PORT-A-CATH;  Surgeon: Rolm Bookbinder, MD;  Location: [facility];  Service: General;  Laterality: Right;  . Back surgery      x3, lower back  . Cataract extraction Bilateral   . Port-a-cath removal N/A 10/14/2013    Procedure: REMOVAL PORT-A-CATH;  Surgeon: Rolm Bookbinder, MD;  Location: [facility];  Service: General;  Laterality: N/A;  . Left heart catheterization with coronary angiogram N/A 09/07/2012    Procedure: LEFT HEART CATHETERIZATION WITH CORONARY ANGIOGRAM;  Surgeon: Troy Sine, MD;  Location: [facility] CATH LAB;  Service: Cardiovascular;  Laterality: N/A;  . Percutaneous coronary stent intervention (pci-s) N/A 09/07/2012    Procedure: PERCUTANEOUS CORONARY STENT INTERVENTION (PCI-S);  Surgeon: Troy Sine, MD;  Location[facility] C[facility] LAB;  Service: Cardiovascular;  Laterality: N/A;  . Left heart catheterization with coronary angiogram  09/09/2012    Procedure: LEFT HEART CATHETERIZATION WITH CORONARY ANGIOGRAM;  Surgeon: Leonie Man, MD;  Location: [facility] CATH LAB;  Service: Cardiovascular;;     Social History:  The patient  reports that she has never smoked. She has never used smokeless tobacco. She reports that she drinks alcohol. She reports that she does not use illicit drugs.   Family History:  The patient's family history includes Heart disease in her father; Hypertension in her father. There is no history of Cancer, Diabetes, Hyperlipidemia, Kidney disease, or Stroke.    ROS:  Please see the history of present illness. All other systems are reviewed and  Negative to the above problem except as noted.    PHYSICAL EXAM: VS:  BP 148/88 mmHg  Pulse 77  Ht 5\' 5"  (1.651 m)  Wt 151 lb (68.493 kg)  BMI 25.13 kg/m2  SpO2 98%  GEN: Well nourished, well developed, in no acute distress HEENT: normal Neck: no JVD, carotid bruits, or  masses Cardiac: RRR; no murmurs, rubs, or gallops,no edema  Respiratory:  clear to auscultation bilaterally, normal work of breathing GI: soft, nontender, nondistended, + BS  No hepatomegaly  MS: no deformity Moving all extremities   Skin: warm and dry, no rash Neuro:  Strength and sensation are intact Psych: euthymic mood, full affect   EKG:  EKG is not ordered today.   Lipid Panel    Component Value Date/Time   CHOL 139 06/01/2015 0854   TRIG 86.0 06/01/2015 0854   HDL 58.50 06/01/2015 0854  CHOLHDL 2 06/01/2015 0854   VLDL 17.2 06/01/2015 0854   LDLCALC 63 06/01/2015 0854      Wt Readings from Last 3 Encounters:  04/07/16 151 lb (68.493 kg)  10/14/15 144 lb 12.8 oz (65.681 kg)  07/30/15 142 lb 3.2 oz (64.5 kg)      ASSESSMENT AND PLAN:  1  CAD I am not convinced of active angina  2 Systolic CHF  Volume status is OK    3  Neuro  4  HTN  Diastolic is a little high  I would not change meds for now.    5  HL  Check lipid pane; tpdau  WIll check CBC, BMET, BNP today    5 DM    6  Parotid mass  7  HL  8  RAS     Signed, Dorris Carnes, MD  04/07/2016 12:35 PM    [facility] [place], [place], [place]  [address] Phone: [phone]; Fax: [phone]

## 2016-04-07 NOTE — Telephone Encounter (Signed)
Spoke with [employer] - reporting critical glucose of 541 drawn today - labs in [facility] Message routed to Dr. Harrington Challenger who saw patient this AM

## 2016-04-07 NOTE — Telephone Encounter (Signed)
Patient returned call. Provided patient w/BMET (glucose) & lipid results & instructions per Dr. Harrington Challenger. Patient agrees w/plan  zetia Rx sent to pharmacy  Repeat lipid ordered and patient scheduled for labs on 8/11 per patient request - she is aware she will need to be fasting.   Informed patient that we do not have CBC or BNP results but she will be contacted with those when the MD reviews.

## 2016-04-07 NOTE — Patient Instructions (Signed)
Medication Instructions:  The current medical regimen is effective;  continue present plan and medications.  Labwork: Please have blood work today. (CBC,BMP, BNP and Lipid)  Follow-Up: Follow up in 6 months with Dr. Harrington Challenger.  You will receive a letter in the mail 2 months before you are due.  Please call us when you receive this letter to schedule your follow up appointment.  If you need a refill on your cardiac medications before your next appointment, please call your pharmacy.  Thank you for choosing [facility]!!

## 2016-04-10 NOTE — Telephone Encounter (Signed)
Left detailed message on cell phone that cbc and bnp have resulted and are ok per Dr. Harrington Challenger.

## 2016-06-09 ENCOUNTER — Other Ambulatory Visit: Payer: 59 | Admitting: *Deleted

## 2016-06-09 DIAGNOSIS — E785 Hyperlipidemia, unspecified: Secondary | ICD-10-CM

## 2016-06-09 LAB — LIPID PANEL
Cholesterol: 191 mg/dL (ref 125–200)
HDL: 61 mg/dL (ref >=46)
LDL Cholesterol: 107 mg/dL (ref <130)
Total CHOL/HDL Ratio: 3.1 Ratio (ref <=5.0)
Triglycerides: 114 mg/dL (ref <150)
VLDL: 23 mg/dL (ref <30)

## 2016-07-17 ENCOUNTER — Telehealth: Payer: Self-pay | Admitting: Internal Medicine

## 2016-07-17 NOTE — Telephone Encounter (Signed)
BP was llow  I would recomm she cut out Hydralazine for now   Continue to follow   Keep on onther meds for now   Call back with BP readings in 1 wk

## 2016-07-17 NOTE — Telephone Encounter (Signed)
Patient calling about her BP dropping this morning while she was at work BP 94/50. Patient stated that at the time she was dizzy, lightheaded, and had some sweating. Patient stated she feels better now, BP 100/65 and HR 78. Patient wants to know if she should check her BP before she takes her BP medications, and if it's low what medications should she hold. Patient wants parameters on her BP readings to hold medications. Will forward to Dr. Harrington Challenger for advisement.

## 2016-07-17 NOTE — Telephone Encounter (Signed)
New Message:  Please call her,she have some questions. Pt said her blood pressure dropped today at work to 94/50.She is at home now,have not checked it yet.

## 2016-07-18 NOTE — Telephone Encounter (Signed)
Called patient about Dr. Alan Ripper recommendations. Per Dr. Harrington Challenger, patient can cut out her Hydralazine for now, continue her other mediations for now, and call back with BP reading in one week. Patient verbalized understanding and will call back next week with BP readings.

## 2016-10-08 DIAGNOSIS — I517 Cardiomegaly: Secondary | ICD-10-CM | POA: Diagnosis not present

## 2016-10-08 DIAGNOSIS — I110 Hypertensive heart disease with heart failure: Secondary | ICD-10-CM | POA: Diagnosis not present

## 2016-10-08 DIAGNOSIS — E119 Type 2 diabetes mellitus without complications: Secondary | ICD-10-CM | POA: Diagnosis present

## 2016-10-08 DIAGNOSIS — Z7982 Long term (current) use of aspirin: Secondary | ICD-10-CM | POA: Diagnosis not present

## 2016-10-08 DIAGNOSIS — E1165 Type 2 diabetes mellitus with hyperglycemia: Secondary | ICD-10-CM | POA: Diagnosis not present

## 2016-10-08 DIAGNOSIS — I5021 Acute systolic (congestive) heart failure: Secondary | ICD-10-CM | POA: Diagnosis not present

## 2016-10-08 DIAGNOSIS — D751 Secondary polycythemia: Secondary | ICD-10-CM | POA: Diagnosis not present

## 2016-10-08 DIAGNOSIS — I358 Other nonrheumatic aortic valve disorders: Secondary | ICD-10-CM | POA: Diagnosis not present

## 2016-10-08 DIAGNOSIS — E871 Hypo-osmolality and hyponatremia: Secondary | ICD-10-CM | POA: Diagnosis not present

## 2016-10-08 DIAGNOSIS — Z955 Presence of coronary angioplasty implant and graft: Secondary | ICD-10-CM | POA: Diagnosis not present

## 2016-10-08 DIAGNOSIS — I519 Heart disease, unspecified: Secondary | ICD-10-CM | POA: Diagnosis not present

## 2016-10-08 DIAGNOSIS — Z794 Long term (current) use of insulin: Secondary | ICD-10-CM | POA: Diagnosis not present

## 2016-10-08 DIAGNOSIS — R0602 Shortness of breath: Secondary | ICD-10-CM | POA: Diagnosis not present

## 2016-10-08 DIAGNOSIS — Z79899 Other long term (current) drug therapy: Secondary | ICD-10-CM | POA: Diagnosis not present

## 2016-10-08 DIAGNOSIS — I5023 Acute on chronic systolic (congestive) heart failure: Secondary | ICD-10-CM | POA: Diagnosis present

## 2016-10-08 DIAGNOSIS — J9601 Acute respiratory failure with hypoxia: Secondary | ICD-10-CM | POA: Diagnosis not present

## 2016-10-08 DIAGNOSIS — E876 Hypokalemia: Secondary | ICD-10-CM | POA: Diagnosis present

## 2016-10-08 DIAGNOSIS — I2510 Atherosclerotic heart disease of native coronary artery without angina pectoris: Secondary | ICD-10-CM | POA: Diagnosis present

## 2016-10-08 DIAGNOSIS — J90 Pleural effusion, not elsewhere classified: Secondary | ICD-10-CM | POA: Diagnosis not present

## 2016-10-08 DIAGNOSIS — I472 Ventricular tachycardia: Secondary | ICD-10-CM | POA: Diagnosis not present

## 2016-10-08 DIAGNOSIS — J811 Chronic pulmonary edema: Secondary | ICD-10-CM | POA: Diagnosis not present

## 2016-10-08 DIAGNOSIS — I5189 Other ill-defined heart diseases: Secondary | ICD-10-CM | POA: Diagnosis not present

## 2016-10-08 DIAGNOSIS — I252 Old myocardial infarction: Secondary | ICD-10-CM | POA: Diagnosis not present

## 2016-10-08 DIAGNOSIS — I161 Hypertensive emergency: Secondary | ICD-10-CM | POA: Diagnosis present

## 2016-10-08 DIAGNOSIS — I10 Essential (primary) hypertension: Secondary | ICD-10-CM | POA: Diagnosis not present

## 2016-10-08 DIAGNOSIS — Z853 Personal history of malignant neoplasm of breast: Secondary | ICD-10-CM | POA: Diagnosis not present

## 2016-10-11 ENCOUNTER — Encounter: Payer: Self-pay | Admitting: Physician Assistant

## 2016-10-12 NOTE — Progress Notes (Deleted)
Cardiology Office Note    Date:  10/12/2016   ID:  Marisa Forbes, DOB 1951/01/11, MRN [id number]  PCP:  Scarlette Calico, MD  Cardiologist:  Dr. Harrington Challenger  CC: follow up   History of Present Illness:  Marisa Forbes is a 65 y.o. female with a history of CAD (STEMI 08/2012 s/p DES to LAD and PTCA to distal LAD, residual RCA/Diag disease treated medically), post-cath stroke, carotid artery stenosis, renal artery stenosis, LV dysfunction EF 45-50%, HTN, DM, HLD, breast CA tx with lumpectomy/chemo/radiation who presents for follow who presents to clinic for follow up.   During her admission for STEMI back in 2013, she had required re-look cath for recurrent pain. This showed stable post-PCI anatomy with diffuse distal LAD disease and suggestion of small dissection distal to tiny D2, small caliber D1 with prox and mid 70-80% lesions (unsuitable for PCI), 50% mid RCA and 70% pre PDA lesions, stable. Ranexa was prescribed but she could not afford this. Brilinta was stopped by Dr. Harrington Challenger in 09/2013.   She has historically uncontrolled BPs and renal artery scan showed >60% L renal artery stenosis which has progressed since 2013 when it was about 20%. Incidentally the study also shows that her right kidney is 3.83 cm smaller than her left kidney. She was referred to Dr. Gwenlyn Found and started on HCTZ. She has never been in to see Dr. Gwenlyn Found.   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 This was in the setting of severe, uncontrolled HTN with BP of 227/119, due to medication noncompliance. CT of the head showing no evidence of acute intracranial findings but did show old infarct and chronic microvascular disease. An echo was performed which demonstrated no obvious source of embolus, however she was noted to have reduction in her LVEF at 20-25%. This was down from 45-50% in 2014. Subsequently, she underwent a cardiac stress MRI which showed scar in the entire  anterior, mid anteroseptal and apical septal walls with minimal ischemia in the mid inferoseptal wall; likely due to HTN and prior chemotherapy. It was recommended not to do a cath, but rather aggressive medical management of her HTN and systolic heart failure. She was placed on an ACE-I and BB and discharged home with a LifVest. F/u limited 2D echo 07/15/15 showed improvement in EF back to 40-45%. Lifevest discontinued.   She was admitted for chest pain observation in 07/2015. She ruled out and was discharged home.   Recent phone note reveals her BPs have been running low and she felt dizzy. Hydralazine was discontinued.   Today she presents to clinic for follow up.     Past Medical History:  Diagnosis Date  . Arthritis    back  . Balance problems    due to stroke  . Breast cancer ([place]) 12/03/12   a. 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 S/p chemo with CMF and radiation.  . CHF (congestive heart failure) ([place])    On Monday she said her cardiologist was concerned for possible CHF, but is now improving-per pt.(07/26/15)  . Coronary artery disease    a. STEMI 08/2012: s/p DES to LAD, PTCA to downstream LAD. b. Relook cath same hospitalization: stable post-PCI anatomy with Stable Diag lesions (unlikely cause of resting angina, not good PCI targets), stable RCA lesion (non flow limiting).  . Diabetes mellitus without complication ([place])    Takes insulin and metformin  .  GERD (gastroesophageal reflux disease)    food related  . History of cancer chemotherapy    last in August 2014  . History of radiation therapy 06/23/2013-08/08/2013   62.4 gray to left breast  . HTN (hypertension)   . Hypercholesteremia   . LV dysfunction    a. EF 45-50% at time of STEMI 08/2012. b. Echo 04/2013: EF 45-50%, mild focal basal hypertrophy of septum, grade 1 d/d.  Marland Kitchen Myocardial infarction 09/07/12  . Peripheral vision loss    left  . Renal artery stenosis ([facility])    a. 20% L RAS in  08/2012 by cath. b. Duplex 07/2012: >60% L RAS.  . Stroke, embolic ([place])    a. [id number] post cath.  . Vasovagal syncope    a. 04/2013 - echo stable compared to prior EF 45-50%, negative carotid duplex.    Past Surgical History:  Procedure Laterality Date  . BACK SURGERY     x3, lower back  . BREAST LUMPECTOMY WITH NEEDLE LOCALIZATION AND AXILLARY SENTINEL LYMPH NODE BX Left 01/08/2013   Procedure: LEFT BREAST WIRE GUIDED  LUMPECTOMY AND LEFT AXILLARY SENTINEL  NODE BX;  Surgeon: Rolm Bookbinder, MD;  Location: [facility];  Service: General;  Laterality: Left;  . CATARACT EXTRACTION Bilateral   . CORONARY ANGIOPLASTY WITH STENT PLACEMENT    . LEFT HEART CATHETERIZATION WITH CORONARY ANGIOGRAM N/A 09/07/2012   Procedure: LEFT HEART CATHETERIZATION WITH CORONARY ANGIOGRAM;  Surgeon: Troy Sine, MD;  Location: [facility] CATH LAB;  Service: Cardiovascular;  Laterality: N/A;  . LEFT HEART CATHETERIZATION WITH CORONARY ANGIOGRAM  09/09/2012   Procedure: LEFT HEART CATHETERIZATION WITH CORONARY ANGIOGRAM;  Surgeon: Leonie Man, MD;  Location: [facility] CATH LAB;  Service: Cardiovascular;;  . PERCUTANEOUS CORONARY STENT INTERVENTION (PCI-S) N/A 09/07/2012   Procedure: PERCUTANEOUS CORONARY STENT INTERVENTION (PCI-S);  Surgeon: Troy Sine, MD;  Location: [facility] CATH LAB;  Service: Cardiovascular;  Laterality: N/A;  . PORT-A-CATH REMOVAL N/A 10/14/2013   Procedure: REMOVAL PORT-A-CATH;  Surgeon: Rolm Bookbinder, MD;  Location[facility];  Service: General;  Laterality: N/A;  . PORTACATH PLACEMENT Right 02/17/2013   Procedure: INSERTION PORT-A-CATH;  Surgeon: Rolm Bookbinder, MD;  Location: [facility];  Service: General;  Laterality: Right;    Current Medications: Outpatient Medications Prior to Visit  Medication Sig Dispense Refill  . aspirin 81 MG EC tablet Take 1 tablet (81 mg total) by mouth daily. 30 tablet 12  . atorvastatin (LIPITOR) 80 MG tablet Take 1 tablet (80 mg total) by mouth daily with supper. 90  tablet 3  . canagliflozin (INVOKANA) 100 MG TABS tablet Take 1 tablet (100 mg total) by mouth daily. 100 tablet 0  . carvedilol (COREG) 25 MG tablet Take 1 tablet (25 mg total) by mouth 2 (two) times daily with a meal. 180 tablet 3  . ezetimibe (ZETIA) 10 MG tablet Take 1 tablet (10 mg total) by mouth daily. 30 tablet 5  . glucose blood (ONETOUCH VERIO) test strip Use TID 100 each 12  . HUMALOG KWIKPEN 100 UNIT/ML KiwkPen INJECT 12 UNITS INTO THE SKIN TID WITH MEALS  1  . Insulin Glargine (LANTUS SOLOSTAR) 100 UNIT/ML Solostar Pen Inject 18 units [place] at bedtime. 1 pen 3  . isosorbide mononitrate (IMDUR) 60 MG 24 hr tablet Take 1 tablet (60 mg total) by mouth daily. 90 tablet 3  . lisinopril (PRINIVIL,ZESTRIL) 40 MG tablet Take 1 tablet (40 mg total) by mouth daily with breakfast. 90 tablet 3  . metFORMIN (GLUCOPHAGE XR) 750  MG 24 hr tablet Take 2 tablets (1,500 mg total) by mouth daily with breakfast. 180 tablet 1  . nitroGLYCERIN (NITROSTAT) 0.4 MG SL tablet Place 0.4 mg under the tongue every 5 (five) minutes as needed for chest pain.     No facility-administered medications prior to visit.      Allergies:   Patient has no known allergies.   Social History   Social History  . Marital status: Married    Spouse name: [name]  . Number of children: 2  . Years of education: college   Occupational History  .  [employer]   Social History Main Topics  . Smoking status: Never Smoker  . Smokeless tobacco: Never Used  . Alcohol use Yes     Comment: socially  . Drug use: No  . Sexual activity: Yes   Other Topics Concern  . Not on file   Social History Narrative   Patient is out of work on medical leave and lives at home with husband.    Patient has 2 children and some college education.               Family History:  The patient's family history includes Heart disease in her father; Hypertension in her father.     ROS:   Please see the history of present illness.     ROS All other systems reviewed and are negative.   PHYSICAL EXAM:   VS:  There were no vitals taken for this visit.   GEN: Well nourished, well developed, in no acute distress  HEENT: normal  Neck: no JVD, carotid bruits, or masses Cardiac: ***RRR; no murmurs, rubs, or gallops,no edema  Respiratory:  clear to auscultation bilaterally, normal work of breathing GI: soft, nontender, nondistended, + BS MS: no deformity or atrophy  Skin: warm and dry, no rash Neuro:  Alert and Oriented x 3, Strength and sensation are intact Psych: euthymic mood, full affect  Wt Readings from Last 3 Encounters:  04/07/16 151 lb (68.5 kg)  10/14/15 144 lb 12.8 oz (65.7 kg)  07/30/15 142 lb 3.2 oz (64.5 kg)      Studies/Labs Reviewed:   EKG:  EKG is*** ordered today.  The ekg ordered today demonstrates ***  Recent Labs: 04/07/2016: Brain Natriuretic Peptide 38.0; BUN 17; Creat 0.81; Hemoglobin 15.5; Platelets 210; Potassium 4.2; Sodium 134   Lipid Panel    Component Value Date/Time   CHOL 191 06/09/2016 1420   TRIG 114 06/09/2016 1420   HDL 61 06/09/2016 1420   CHOLHDL 3.1 06/09/2016 1420   VLDL 23 06/09/2016 1420   LDLCALC 107 06/09/2016 1420    Additional studies/ records that were reviewed today include:  2D ECHO: 07/15/2015 LV EF: 40% -   45% Study Conclusions - Left ventricle: The cavity size was normal. Wall thickness was   increased in a pattern of mild LVH. Systolic function was mildly   to moderately reduced. The estimated ejection fraction was in the   range of 40% to 45%. There is akinesis of the   mid-apicalanteroseptal myocardium. - Mitral valve: Calcified annulus. Mildly thickened leaflets . Impressions: - When compared to prior, EF is improved.  Cardiac stress MRI 03/2015 IMPRESSION: 1. Normal left ventricular size with moderate concentric hypertrophy and severely decreased systolic function (LVEF = 26%). There is akinesis of the basal anterior, mid anterior,  anteroseptal and apical anterior and septal walls. 2. Normal right ventricular size, thickness and low normal systolic function (RVEF =  42%). 3. Stress test indicates that there is a scar in the entire anterior, mid anteroseptal and apical septal walls with minimal ischemia in the mid inferoseptal wall. 4. Late gadolinium images show a scar a scar in the entire anterior, mid anteroseptal and apical septal walls with poor recovery if revascularized. Collectively, these findings are showing that the degree of left ventricular systolic impairment are out of proportion to the infarct area and other causes such as hypertension and prior chemotherapy are most probably contributing to the degree of cardiomyopathy. I would not recommend a cath but rather aggressive management of hypertension and treatment of systolic heart failure.  Carotid duplex 03/2015 Summary: Findings suggest 1-39% internal carotid artery stenosis bilaterally. Vertebral arteries are patent with antegrade flow.  ASSESSMENT & PLAN:   Marisa Forbes is a 65 y.o. female with a history of CAD (STEMI 08/2012 s/p DES to LAD and PTCA to distal LAD, residual RCA/Diag disease treated medically), post-cath stroke, carotid artery stenosis, renal artery stenosis, LV dysfunction EF 45-50%, HTN, DM, HLD, breast CA tx with lumpectomy/chemo/radiation who presents for follow who presents to clinic for follow up.   Hypertension: with recent hypotension requiring DC of hydralazine. ***  CAD: stable: cardiac stress MRI in 03/2015 without high risk ischemia. Continue ASA, statin and BB.   Renal artery stenosis: BP has actually been soft recently requiring de escalation of BP meds. Continue medical therapy for now.   Chronic systolic CHF: EF [id number] by most recent echo. Stress MRI in 03/2015 showed degree of left ventricular systolic impairment are out of proportion to the infarct area and other causes such as hypertension and prior chemotherapy  are most probably contributing to the degree of cardiomyopathy. Plan was  aggressive management of hypertension and treatment of systolic heart failure.  -- Continue Coreg and Lisinopril. Hydralazine recently discontinued 2/2 hypotension. Continue imdur. -- Appears euvolemic off diuretics  HLD: continue statin   Breast CA: s/p chemo, rad, lumpectomy  Carotid artery stenosis: continue asa and statin.    Medication Adjustments/Labs and Tests Ordered: Current medicines are reviewed at length with the patient today.  Concerns regarding medicines are outlined above.  Medication changes, Labs and Tests ordered today are listed in the Patient Instructions below. There are no Patient Instructions on file for this visit.   Signed, Angelena Form, PA-C  10/12/2016 1:01 PM    [facility] [place], [place], [place]  [address] Phone: [phone]; Fax: [phone]

## 2016-10-17 ENCOUNTER — Ambulatory Visit: Payer: 59 | Admitting: Physician Assistant

## 2016-11-01 ENCOUNTER — Ambulatory Visit: Payer: 59 | Admitting: Physician Assistant

## 2016-11-07 NOTE — Progress Notes (Signed)
Cardiology Office Note    Date:  11/08/2016   ID:  Marisa Forbes, DOB 10-20-1951, MRN [id number]  PCP:  Scarlette Calico, MD  Cardiologist:  Dr. Harrington Challenger  CC: follow up  History of Present Illness:  Marisa Forbes is a 66 y.o. female with a history of CAD (STEMI 08/2012 s/p DES to LAD and PTCA to distal LAD, residual RCA/Diag disease treated medically), post-cath stroke, carotid artery stenosis, renal artery stenosis, chronic systolic CHF, HTN, DMT2, HLD, and breast CA s/p lumpectomy/chemo/radiation who presents to clinic for follow up.   During her admission for STEMI back in 2013, she had required re-look cath for recurrent pain. This showed stable post-PCI anatomy with diffuse distal LAD disease and suggestion of small dissection distal to tiny D2, small caliber D1 with prox and mid 70-80% lesions (unsuitable for PCI), 50% mid RCA and 70% pre PDA lesions, stable. Ranexa was prescribed but she could not afford this. Brilinta was stopped by Dr. Harrington Challenger in 09/2013.   She has historically uncontrolled BPs and renal artery scan 07/2014 showed >60% L renal artery stenosis. Incidentally the study also showed that her right kidney was 3.83 cm smaller than her left kidney. She was referred to Dr. Gwenlyn Found and started on HCTZ. However, she never followed up with Dr. Gwenlyn Found.   She was admitted in 03/2015 with visual/balance issues and diagnosed with a TIA vs. 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 This was in the setting of severe, uncontrolled HTN with BP of 227/119, due to medication noncompliance. CT of the head showed no evidence of acute intracranial findings but did show old infarct and chronic microvascular disease. An echo was performed which demonstrated no obvious source of embolus, however she was noted to have reduction in her LVEF at 20-25%. This was down from 45-50% in 2014. Subsequently, she underwent a cardiac stress MRI which showed scar in the entire anterior, mid anteroseptal and apical septal walls with minimal  ischemia in the mid inferoseptal wall; likely due to HTN and prior chemotherapy. It was recommended not to do a cath, but rather continue aggressive medical management of her HTN and systolic heart failure. She was placed on an ACE-I and BB and discharged home with a LifVest. F/u limited 2D echo 07/15/15 showed improvement in EF back to 40-45%. Lifevest discontinued.   She was admitted for chest pain observation in 07/2015. She ruled out and was discharged home.   Phone notes from 06/2016 reveal that her BPs had been running low and she felt dizzy. Hydralazine was discontinued.   She was recently admitted in 09/2016 at [facility] for acute CHF with flash pulmonary edema. Limited ECHO showed EF 30-35%. Noted to have runs of NSVT. Discharged on Lasix 40mg  daily and spironolactone 25mg  daily.   Today she presents to clinic for follow up. Within 2-3 days of stopping the hydralazine she felt much better. No CP or SOB. No LE edema, orthopnea or PND. No dizziness or syncope. No blood in stool or urine. No palpitations. She has felt much better since discharge.     Past Medical History:  Diagnosis Date  . Arthritis    back  . Balance problems    due to stroke  . Breast cancer ([place]) 12/03/12   a. 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 S/p chemo with CMF and radiation.  . CHF (congestive heart failure) [place])    On Monday she said her cardiologist was concerned for possible CHF, but is now improving-per  pt.(07/26/15)  . Coronary artery disease    a. STEMI 08/2012: s/p DES to LAD, PTCA to downstream LAD. b. Relook cath same hospitalization: stable post-PCI anatomy with Stable Diag lesions (unlikely cause of resting angina, not good PCI targets), stable RCA lesion (non flow limiting).  . Diabetes mellitus without complication ([place])    Takes insulin and metformin  . GERD (gastroesophageal reflux disease)    food related  . History of cancer chemotherapy    last in August 2014    . History of radiation therapy 06/23/2013-08/08/2013   62.4 gray to left breast  . HTN (hypertension)   . Hypercholesteremia   . LV dysfunction    a. EF 45-50% at time of STEMI 08/2012. b. Echo 04/2013: EF 45-50%, mild focal basal hypertrophy of septum, grade 1 d/d.  Marland Kitchen Myocardial infarction 09/07/12  . Peripheral vision loss    left  . Renal artery stenosis ([facility])    a. 20% L RAS in 08/2012 by cath. b. Duplex 07/2012: >60% L RAS.  . Stroke, embolic ([place])    a. [id number] post cath.  . Vasovagal syncope    a. 04/2013 - echo stable compared to prior EF 45-50%, negative carotid duplex.    Past Surgical History:  Procedure Laterality Date  . BACK SURGERY     x3, lower back  . BREAST LUMPECTOMY WITH NEEDLE LOCALIZATION AND AXILLARY SENTINEL LYMPH NODE BX Left 01/08/2013   Procedure: LEFT BREAST WIRE GUIDED  LUMPECTOMY AND LEFT AXILLARY SENTINEL  NODE BX;  Surgeon: Rolm Bookbinder, MD;  Location: [place];  Service: General;  Laterality: Left;  . CATARACT EXTRACTION Bilateral   . CORONARY ANGIOPLASTY WITH STENT PLACEMENT    . LEFT HEART CATHETERIZATION WITH CORONARY ANGIOGRAM N/A 09/07/2012   Procedure: LEFT HEART CATHETERIZATION WITH CORONARY ANGIOGRAM;  Surgeon: Troy Sine, MD;  [facility] C[facility] LAB;  Service: Cardiovascular;  Laterality: N/A;  . LEFT HEART CATHETERIZATION WITH CORONARY ANGIOGRAM  09/09/2012   Procedure: LEFT HEART CATHETERIZATION WITH CORONARY ANGIOGRAM;  Surgeon: Leonie Man, MD;  Location: [facility] CATH LAB;  Service: Cardiovascular;;  . PERCUTANEOUS CORONARY STENT INTERVENTION (PCI-S) N/A 09/07/2012   Procedure: PERCUTANEOUS CORONARY STENT INTERVENTION (PCI-S);  Surgeon: Troy Sine, MD;  Location: [facility] CATH LAB;  Service: Cardiovascular;  Laterality: N/A;  . PORT-A-CATH REMOVAL N/A 10/14/2013   Procedure: REMOVAL PORT-A-CATH;  Surgeon: Rolm Bookbinder, MD;  Location: [facility];  Service: General;  Laterality: N/A;  . PORTACATH PLACEMENT Right 02/17/2013   Procedure:  INSERTION PORT-A-CATH;  Surgeon: Rolm Bookbinder, MD;  Location: [place];  Service: General;  Laterality: Right;    Current Medications: Outpatient Medications Prior to Visit  Medication Sig Dispense Refill  . aspirin 81 MG EC tablet Take 1 tablet (81 mg total) by mouth daily. 30 tablet 12  . atorvastatin (LIPITOR) 80 MG tablet Take 1 tablet (80 mg total) by mouth daily with supper. 90 tablet 3  . canagliflozin (INVOKANA) 100 MG TABS tablet Take 1 tablet (100 mg total) by mouth daily. 100 tablet 0  . carvedilol (COREG) 25 MG tablet Take 1 tablet (25 mg total) by mouth 2 (two) times daily with a meal. 180 tablet 3  . ezetimibe (ZETIA) 10 MG tablet Take 1 tablet (10 mg total) by mouth daily. 30 tablet 5  . glucose blood (ONETOUCH VERIO) test strip Use TID 100 each 12  . Insulin Glargine (LANTUS SOLOSTAR) 100 UNIT/ML Solostar Pen Inject 18 units [place] at bedtime. 1 pen 3  . insulin  lispro (HUMALOG) 100 UNIT/ML injection Inject into the skin.    Marland Kitchen isosorbide mononitrate (IMDUR) 60 MG 24 hr tablet Take 1 tablet (60 mg total) by mouth daily. 90 tablet 3  . nitroGLYCERIN (NITROSTAT) 0.4 MG SL tablet Place 0.4 mg under the tongue every 5 (five) minutes as needed for chest pain.    Marland Kitchen lisinopril (PRINIVIL,ZESTRIL) 40 MG tablet Take 1 tablet (40 mg total) by mouth daily with breakfast. 90 tablet 3  . metFORMIN (GLUCOPHAGE XR) 750 MG 24 hr tablet Take 2 tablets (1,500 mg total) by mouth daily with breakfast. 180 tablet 1   No facility-administered medications prior to visit.      Allergies:   Patient has no known allergies.   Social History   Social History  . Marital status: Married    Spouse name: [name]  . Number of children: 2  . Years of education: college   Occupational History  .  [employer]   Social History Main Topics  . Smoking status: Never Smoker  . Smokeless tobacco: Never Used  . Alcohol use Yes     Comment: socially  . Drug use: No  . Sexual activity: Yes    Other Topics Concern  . None   Social History Narrative   Patient is out of work on medical leave and lives at home with husband.    Patient has 2 children and some college education.               Family History:  The patient's family history includes Heart disease in her father; Hypertension in her father.      ROS:   Please see the history of present illness.    ROS All other systems reviewed and are negative.   PHYSICAL EXAM:   VS:  BP 114/82 (BP Location: Right Arm, Patient Position: Sitting, Cuff Size: Normal)   Pulse 84   Ht 5\' 5"  (1.651 m)   Wt 144 lb (65.3 kg)   BMI 23.96 kg/m    GEN: Well nourished, well developed, in no acute distress  HEENT: normal  Neck: no JVD, carotid bruits, or masses Cardiac: RRR; no murmurs, rubs, or gallops,no edema  Respiratory:  clear to auscultation bilaterally, normal work of breathing GI: soft, nontender, nondistended, + BS MS: no deformity or atrophy  Skin: warm and dry, no rash Neuro:  Alert and Oriented x 3, Strength and sensation are intact Psych: euthymic mood, full affect   Wt Readings from Last 3 Encounters:  11/08/16 144 lb (65.3 kg)  04/07/16 151 lb (68.5 kg)  10/14/15 144 lb 12.8 oz (65.7 kg)      Studies/Labs Reviewed:   EKG:  EKG is ordered today.  The ekg ordered today demonstrates NSR with PVCs HR 80  Recent Labs: 04/07/2016: Brain Natriuretic Peptide 38.0; BUN 17; Creat 0.81; Hemoglobin 15.5; Platelets 210; Potassium 4.2; Sodium 134   Lipid Panel    Component Value Date/Time   CHOL 191 06/09/2016 1420   TRIG 114 06/09/2016 1420   HDL 61 06/09/2016 1420   CHOLHDL 3.1 06/09/2016 1420   VLDL 23 06/09/2016 1420   LDLCALC 107 06/09/2016 1420    Additional studies/ records that were reviewed today include:  2D ECHO: 07/15/2015 LV EF: 40% - 45% Study Conclusions - Left ventricle: The cavity size was normal. Wall thickness was increased in a pattern of mild LVH. Systolic function was mildly to  moderately reduced. The estimated ejection fraction was in the range of  40% to 45%. There is akinesis of the mid-apicalanteroseptal myocardium. - Mitral valve: Calcified annulus. Mildly thickened leaflets . Impressions: - When compared to prior, EF is improved.  Cardiac stress MRI 03/2015 IMPRESSION: 1. Normal left ventricular size with moderate concentric hypertrophy and severely decreased systolic function (LVEF = 26%). There is akinesis of the basal anterior, mid anterior, anteroseptal and apical anterior and septal walls. 2. Normal right ventricular size, thickness and low normal systolic function (RVEF = 42%). 3. Stress test indicates that there is a scar in the entire anterior, mid anteroseptal and apical septal walls with minimal ischemia in the mid inferoseptal wall. 4. Late gadolinium images show a scar a scar in the entire anterior, mid anteroseptal and apical septal walls with poor recovery if revascularized. Collectively, these findings are showing that the degree of left ventricular systolic impairment are out of proportion to the infarct area and other causes such as hypertension and prior chemotherapy are most probably contributing to the degree of cardiomyopathy. I would not recommend a cath but rather aggressive management of hypertension and treatment of systolic heart failure.  Carotid duplex 03/2015 Summary: Findings suggest 1-39% internal carotid artery stenosis bilaterally. Vertebral arteries are patent with antegrade flow.  2D ECHO 10/08/16 limited portable two-dimensional transthoracic echocardiogram with color Doppler and Spectral Doppler was performed. Definity contrast injection performed. The study was technically difficult. Left Ventricle The left ventricle is normal in size. There is mild concentric left ventricular hypertrophy. There is anteroseptal and apical wall akinesis. The left ventricular ejection fraction is markedly reduced (30-35%). Grade  I mild diastolic dysfunction; abnormal relaxation pattern. Right Ventricle The right ventricle is grossly normal in size and function. Mitral Valve The mitral valve is grossly normal. There is no mitral regurgitation. Tricuspid Valve The tricuspid valve is not well visualized, unable to adequately assess function. Estimation of right ventricular systolic pressure is not possible. Aortic Valve Moderate aortic sclerosis is present with good valvular opening. There is no aortic regurgitation present. Pulmonic Valve The pulmonic valve is not well seen, but is grossly normal. There is no pulmonic regurgitation. Vessels The aortic root is normal in diameter. Pericardium There is no pericardial effusion. MMode/2D Measurements & Calculations IVSd: 1.1 cm LVIDd: 5.0 cm LVIDs: 4.2 cm LVPWd: 1.1 cm    ASSESSMENT & PLAN:   Hypertension with renal artery stenosis: BP has actually been requiring de-escalation of BP meds 2/2 hypotension. 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 Dr. Meda Coffee felt HTN and prior chemotherapy were the most likely contributing to her CHF. Plan was aggressive medical management of HTN and CHF. EF had improved by echo in 07/2015 but most recent echo from [facility] shows EF down to 30-35%  -- She has been on Coreg 25mg  BID, Lisinopril 40mg  daily, spironolactone 25mg  daily and imdur 60mg  daily. Hydralazine discontinued 2/2 hypotension. Will stop Lisinopril (wash out for 36 hours) and start Entresto 49/51mg  BID. Will have her seen in the HTN clinic next week to assess volume status and BP and draw BMET.  -- Appears euvolemic today. Continue Lasix 40mg  daily. Will check a BMET today -- Will get a cardiac MR to assess EF. If it remains <35%, I will refer her to EP to discuss ICD placement.   CAD: stable. Cardiac stress MRI in 03/2015 without high risk  ischemia. Continue ASA, statin  and BB.   HLD: continue statin   Breast CA: s/p chemo, rad, lumectomy  Carotid artery stenosis: continue asa and statin   Total time spent with patient was over 40 minutes which included evaluating patient, reviewing record and coordinating care. Face to face time >50%. Reviewed old records and discharge summary from [place]. Discussed case with Dr. Burt Knack and we decided to start [facility] and get a cardiac MR.    Medication Adjustments/Labs and Tests Ordered: Current medicines are reviewed at length with the patient today.  Concerns regarding medicines are outlined above.  Medication changes, Labs and Tests ordered today are listed in the Patient Instructions below. Patient Instructions  Medication Instructions:  Your physician has recommended you make the following change in your medication:  1.  STOP the Lisinopril AS OF TODAY 2.  START Entresto 49/51 on Saturday 11/11/16 taking 1 tablet twice a day   Labwork: TODAY:  BMET  1 WEEK (AT TIME OF PHARM D APPT)  BMET  Testing/Procedures: Your physician has requested that you have a cardiac MRI. Cardiac MRI uses a computer to create images of your heart as its beating, producing both still and moving pictures of your heart and major blood vessels. For further information please visit [email or web address] Please follow the instruction sheet given to you today for more information.   Follow-Up: Your physician recommends that you schedule a follow-up appointment in: 1 WEEK WITH PHARM-D FOR BP RECHECK  Your physician recommends that you schedule a follow-up appointment in: [place] DR. ROSS OR KATIE THOMPSON, PA-C1  Any Other Special Instructions Will Be Listed Below (If Applicable).  Magnetic Resonance Cholangiopancreatogram Introduction Magnetic resonance imaging (MRI) is a type of procedure that is used to produce pictures of the inside of the body without using X-rays. Instead, strong magnets and  radio waves work together in a [employer], [employer] to form very detailed and sharp images. The images are viewed on a TV monitor in two-dimensional and three-dimensional form. The magnets and the radio waves are harmless. Magnetic resonance cholangiopancreatogram, or magnetic resonance cholangiopancreatography (MRCP), is an MRI that is done on your gallbladder, bile duct, pancreas, and pancreatic duct. MRCP produces detailed images of these organs. It can be used to help diagnose problems such as tumors, stones, infection, or inflammation. It is sometimes used to help determine the cause of pancreatitis or belly (abdominal) pain. Contrast material may be injected to make MRCP images even more clear. Tell a health care provider about:  Any allergies you have.  All medicines you are taking, including vitamins, herbs, eye drops, creams, and over-the-counter medicines.  Any surgeries you have had.  Medical conditions you have, including kidney disease.  Any metal you may have in your body. The magnet used in this procedure can cause metal objects in your body to move. Metal can also make it hard to get high-quality images. Objects that contain metal include:  A pacemaker or any other implants, such as an implanted neurostimulator, a metallic ear implant, or a metallic object within the eye socket.  Metal splinters.  Any bullet fragments.  A port for delivering insulin or chemotherapy.  Any tattoos you have. Some red dyes contain iron, which is sometimes a problem.  If you are pregnant or you think that you may be pregnant. It is best to avoid this test during the first 3 months of pregnancy unless there is a significant risk of missing a serious diagnosis without performing the test.  If you are breastfeeding.  If you are afraid of cramped spaces (claustrophobic). If claustrophobia is a problem for you, it can usually be relieved with mild sedatives or antianxiety medicines. What happens before  the procedure?  You will be asked to remove anything that contains metal, such as a watch or any jewelry that you are wearing. You may also be asked to remove any makeup because some makeup contains traces of metal. Braces and fillings are usually not a problem.  Women who are breastfeeding may need to pump breast milk before the exam so that they have milk to give to their baby until the contrast material (if used) has cleared from their body. What happens during the procedure?  You may be given earplugs because the machine that is used can be noisy. Headphones may also be available so you can listen to music.  If a contrast material will be used, an IV tube will be inserted into one of your veins. The contrast material will be injected through the tube.  You will lie down on a platform.  The platform will slide into a long, magnetic chamber. When you are inside the chamber, you will still be able to talk to the health care provider.  You will be asked to lie very still. The health care provider will tell you when you can shift position. You may have to wait a few minutes to make sure that the images produced during the procedure are readable. The procedure may vary among health care providers and hospitals. What happens after the procedure?  Return to your normal activities as directed by your health care provider.  If contrast material was used, it will pass from your body within a day.  A health care provider who is experienced in MRCP will analyze the results and send a report and an interpretation of the findings to your health care provider.  It is your responsibility to obtain your test results. Ask your health care provider or the department performing the test when and how you will get your results. This information is not intended to replace advice given to you by your health care provider. Make sure you discuss any questions you have with your health care provider. Document  Released: 04/03/2008 Document Revised: 03/23/2016 Document Reviewed: 07/28/2014  2017 Elsevier    If you need a refill on your cardiac medications before your next appointment, please call your pharmacy.      Signed, Angelena Form, PA-C  11/08/2016 4:26 PM    [facility] [place], [place], [place]  [address] Phone: [phone]; Fax: [phone]

## 2016-11-08 ENCOUNTER — Other Ambulatory Visit: Payer: Self-pay | Admitting: *Deleted

## 2016-11-08 ENCOUNTER — Encounter: Payer: Self-pay | Admitting: Physician Assistant

## 2016-11-08 ENCOUNTER — Ambulatory Visit (INDEPENDENT_AMBULATORY_CARE_PROVIDER_SITE_OTHER): Payer: Managed Care, Other (non HMO) | Admitting: Physician Assistant

## 2016-11-08 VITALS — BP 114/82 | HR 84 | Ht 65.0 in | Wt 144.0 lb

## 2016-11-08 DIAGNOSIS — I10 Essential (primary) hypertension: Secondary | ICD-10-CM | POA: Diagnosis not present

## 2016-11-08 DIAGNOSIS — I5022 Chronic systolic (congestive) heart failure: Secondary | ICD-10-CM

## 2016-11-08 DIAGNOSIS — I2583 Coronary atherosclerosis due to lipid rich plaque: Secondary | ICD-10-CM

## 2016-11-08 DIAGNOSIS — I739 Peripheral vascular disease, unspecified: Secondary | ICD-10-CM

## 2016-11-08 DIAGNOSIS — E785 Hyperlipidemia, unspecified: Secondary | ICD-10-CM

## 2016-11-08 DIAGNOSIS — I701 Atherosclerosis of renal artery: Secondary | ICD-10-CM | POA: Diagnosis not present

## 2016-11-08 DIAGNOSIS — I2510 Atherosclerotic heart disease of native coronary artery without angina pectoris: Secondary | ICD-10-CM | POA: Diagnosis not present

## 2016-11-08 DIAGNOSIS — Z853 Personal history of malignant neoplasm of breast: Secondary | ICD-10-CM

## 2016-11-08 DIAGNOSIS — I779 Disorder of arteries and arterioles, unspecified: Secondary | ICD-10-CM

## 2016-11-08 MED ORDER — SACUBITRIL-VALSARTAN 49-51 MG PO TABS: 1.0000 | ORAL_TABLET | Freq: Two times a day (BID) | ORAL | 1 refills | Status: DC

## 2016-11-08 MED ORDER — SPIRONOLACTONE 25 MG PO TABS: 25.0000 mg | ORAL_TABLET | Freq: Every day | ORAL | 3 refills | Status: DC

## 2016-11-08 MED ORDER — FUROSEMIDE 40 MG PO TABS: 40.0000 mg | ORAL_TABLET | Freq: Every day | ORAL | 3 refills | Status: DC

## 2016-11-08 NOTE — Patient Instructions (Signed)
Medication Instructions:  Your physician has recommended you make the following change in your medication:  1.  STOP the Lisinopril AS OF TODAY 2.  START Entresto 49/51 on Saturday 11/11/16 taking 1 tablet twice a day   Labwork: TODAY:  BMET  1 WEEK (AT TIME OF PHARM D APPT)  BMET  Testing/Procedures: Your physician has requested that you have a cardiac MRI. Cardiac MRI uses a computer to create images of your heart as its beating, producing both still and moving pictures of your heart and major blood vessels. For further information please visit [email or web address] Please follow the instruction sheet given to you today for more information.   Follow-Up: Your physician recommends that you schedule a follow-up appointment in: 1 WEEK WITH PHARM-D FOR BP RECHECK  Your physician recommends that you schedule a follow-up appointment in: [place] DR. ROSS OR KATIE THOMPSON, PA-C1  Any Other Special Instructions Will Be Listed Below (If Applicable).  Magnetic Resonance Cholangiopancreatogram Introduction Magnetic resonance imaging (MRI) is a type of procedure that is used to produce pictures of the inside of the body without using X-rays. Instead, strong magnets and radio waves work together in a [employer], [employer] to form very detailed and sharp images. The images are viewed on a TV monitor in two-dimensional and three-dimensional form. The magnets and the radio waves are harmless. Magnetic resonance cholangiopancreatogram, or magnetic resonance cholangiopancreatography (MRCP), is an MRI that is done on your gallbladder, bile duct, pancreas, and pancreatic duct. MRCP produces detailed images of these organs. It can be used to help diagnose problems such as tumors, stones, infection, or inflammation. It is sometimes used to help determine the cause of pancreatitis or belly (abdominal) pain. Contrast material may be injected to make MRCP images even more clear. Tell a health care provider  about:  Any allergies you have.  All medicines you are taking, including vitamins, herbs, eye drops, creams, and over-the-counter medicines.  Any surgeries you have had.  Medical conditions you have, including kidney disease.  Any metal you may have in your body. The magnet used in this procedure can cause metal objects in your body to move. Metal can also make it hard to get high-quality images. Objects that contain metal include:  A pacemaker or any other implants, such as an implanted neurostimulator, a metallic ear implant, or a metallic object within the eye socket.  Metal splinters.  Any bullet fragments.  A port for delivering insulin or chemotherapy.  Any tattoos you have. Some red dyes contain iron, which is sometimes a problem.  If you are pregnant or you think that you may be pregnant. It is best to avoid this test during the first 3 months of pregnancy unless there is a significant risk of missing a serious diagnosis without performing the test.  If you are breastfeeding.  If you are afraid of cramped spaces (claustrophobic). If claustrophobia is a problem for you, it can usually be relieved with mild sedatives or antianxiety medicines. What happens before the procedure?  You will be asked to remove anything that contains metal, such as a watch or any jewelry that you are wearing. You may also be asked to remove any makeup because some makeup contains traces of metal. Braces and fillings are usually not a problem.  Women who are breastfeeding may need to pump breast milk before the exam so that they have milk to give to their baby until the contrast material (if used) has cleared from their body. What happens  during the procedure?  You may be given earplugs because the machine that is used can be noisy. Headphones may also be available so you can listen to music.  If a contrast material will be used, an IV tube will be inserted into one of your veins. The contrast  material will be injected through the tube.  You will lie down on a platform.  The platform will slide into a long, magnetic chamber. When you are inside the chamber, you will still be able to talk to the health care provider.  You will be asked to lie very still. The health care provider will tell you when you can shift position. You may have to wait a few minutes to make sure that the images produced during the procedure are readable. The procedure may vary among health care providers and hospitals. What happens after the procedure?  Return to your normal activities as directed by your health care provider.  If contrast material was used, it will pass from your body within a day.  A health care provider who is experienced in MRCP will analyze the results and send a report and an interpretation of the findings to your health care provider.  It is your responsibility to obtain your test results. Ask your health care provider or the department performing the test when and how you will get your results. This information is not intended to replace advice given to you by your health care provider. Make sure you discuss any questions you have with your health care provider. Document Released: 04/03/2008 Document Revised: 03/23/2016 Document Reviewed: 07/28/2014  2017 Elsevier    If you need a refill on your cardiac medications before your next appointment, please call your pharmacy.

## 2016-11-09 ENCOUNTER — Telehealth: Payer: Self-pay | Admitting: Cardiology

## 2016-11-09 LAB — BASIC METABOLIC PANEL
BUN/Creatinine Ratio: 17 (ref 12–28)
BUN: 15 mg/dL (ref 8–27)
CO2: 22 mmol/L (ref 18–29)
Calcium: 10 mg/dL (ref 8.7–10.3)
Chloride: 92 mmol/L — ABNORMAL LOW (ref 96–106)
Creatinine, Ser: 0.86 mg/dL (ref 0.57–1.00)
GFR calc Af Amer: 82 mL/min/{1.73_m2} (ref >59)
GFR calc non Af Amer: 71 mL/min/{1.73_m2} (ref >59)
Glucose: 405 mg/dL — ABNORMAL HIGH (ref 65–99)
Potassium: 4.3 mmol/L (ref 3.5–5.2)
Sodium: 135 mmol/L (ref 134–144)

## 2016-11-09 NOTE — Telephone Encounter (Signed)
Called patient and left a message to call me back as to when and what day she would like to have her cardiac MRI.

## 2016-11-17 ENCOUNTER — Ambulatory Visit (INDEPENDENT_AMBULATORY_CARE_PROVIDER_SITE_OTHER): Payer: Managed Care, Other (non HMO) | Admitting: Pharmacist

## 2016-11-17 ENCOUNTER — Other Ambulatory Visit: Payer: Managed Care, Other (non HMO)

## 2016-11-17 ENCOUNTER — Telehealth: Payer: Self-pay | Admitting: Cardiology

## 2016-11-17 ENCOUNTER — Other Ambulatory Visit: Payer: Managed Care, Other (non HMO) | Admitting: *Deleted

## 2016-11-17 ENCOUNTER — Ambulatory Visit: Payer: Managed Care, Other (non HMO)

## 2016-11-17 VITALS — BP 118/82 | HR 92

## 2016-11-17 DIAGNOSIS — I2510 Atherosclerotic heart disease of native coronary artery without angina pectoris: Secondary | ICD-10-CM

## 2016-11-17 DIAGNOSIS — I10 Essential (primary) hypertension: Secondary | ICD-10-CM | POA: Diagnosis not present

## 2016-11-17 DIAGNOSIS — I739 Peripheral vascular disease, unspecified: Secondary | ICD-10-CM

## 2016-11-17 DIAGNOSIS — E785 Hyperlipidemia, unspecified: Secondary | ICD-10-CM

## 2016-11-17 DIAGNOSIS — I701 Atherosclerosis of renal artery: Secondary | ICD-10-CM

## 2016-11-17 DIAGNOSIS — Z853 Personal history of malignant neoplasm of breast: Secondary | ICD-10-CM

## 2016-11-17 DIAGNOSIS — I5022 Chronic systolic (congestive) heart failure: Secondary | ICD-10-CM

## 2016-11-17 DIAGNOSIS — I779 Disorder of arteries and arterioles, unspecified: Secondary | ICD-10-CM

## 2016-11-17 DIAGNOSIS — I2583 Coronary atherosclerosis due to lipid rich plaque: Secondary | ICD-10-CM

## 2016-11-17 NOTE — Telephone Encounter (Signed)
Left a voicemail for the patient stating that there were no openings today for her cardiac MRI.  Left my number to call me back to find out another day that is best for her.

## 2016-11-17 NOTE — Progress Notes (Signed)
Patient ID: Marisa Forbes                 DOB: [birth year]                      MRN: [id number]     HPI: Marisa Forbes is a 66 y.o. female patient of Dr Harrington Challenger referred to HTN clinic by Nell Range, PA. PMH is significant for CAD (STEMI 08/2012 s/p DES to LAD and PTCA to distal LAD, residual RCA/Diag disease treated medically), post-cath stroke, carotid artery stenosis, renal artery stenosis, chronic systolic CHF, HTN, DMT2, HLD, and breast CA s/p lumpectomy/chemo/radiation. She has historically uncontrolled BPs and renal artery scan 07/2014 showed >60% L renal artery stenosis. Incidentally the study also showed that her right kidney was 3.83 cm smaller than her left kidney. She was referred to Dr. Gwenlyn Found and started on HCTZ. However, she never followed up with Dr. Gwenlyn Found. 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 This was in the setting of severe, uncontrolled HTN with BP of 227/119 due to medication noncompliance. Most recently, pt's hydralazine was discontinued secondary to pt complaints of low BP and dizziness. Most recent echo 07/2015 showed LVEF of 30-35%. Pt was switched from lisinopril to Entresto at OV 1 week ago and presents today for BMET and HTN visit.  Patient reports tolerating Entresto well. She denies dizziness or headache. Reports compliance with all of her medications. Still on commercial insurance since she is still working - copay card with [employer] makes it cost effective.  Current HTN meds: carvedilol 25mg  BID, Entresto 49-51mg  BID, spironolactone 25mg  daily, Imdur 60mg  daily, furosemide 40mg  daily Previously tried: hydralazine - dizziness BP goal: <130/35mmHg  Family History: The patient's family history includes Heart disease in her father; Hypertension in her father.  Social History: Denies tobacco and illicit drug use. Occasional alcohol use.  Wt Readings from Last 3 Encounters:  11/08/16 144 lb (65.3 kg)  04/07/16  151 lb (68.5 kg)  10/14/15 144 lb 12.8 oz (65.7 kg)   BP Readings from Last 3 Encounters:  11/08/16 114/82  04/07/16 (!) 148/88  10/14/15 130/68   Pulse Readings from Last 3 Encounters:  11/08/16 84  04/07/16 77  10/14/15 85    Renal function: Estimated Creatinine Clearance: 58.7 mL/min (by C-G formula based on SCr of 0.86 mg/dL).  Past Medical History:  Diagnosis Date  . Arthritis    back  . Balance problems    due to stroke  . Breast cancer ([place]) 12/03/12   a. 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 S/p chemo with CMF and radiation.  . CHF (congestive heart failure) ([place])    On Monday she said her cardiologist was concerned for possible CHF, but is now improving-per pt.(07/26/15)  . Coronary artery disease    a. STEMI 08/2012: s/p DES to LAD, PTCA to downstream LAD. b. Relook cath same hospitalization: stable post-PCI anatomy with Stable Diag lesions (unlikely cause of resting angina, not good PCI targets), stable RCA lesion (non flow limiting).  . Diabetes mellitus without complication ([place])    Takes insulin and metformin  . GERD (gastroesophageal reflux disease)    food related  . History of cancer chemotherapy    last in August 2014  . History of radiation therapy 06/23/2013-08/08/2013   62.4 gray to left breast  . HTN (hypertension)   . Hypercholesteremia   . LV  dysfunction    a. EF 45-50% at time of STEMI 08/2012. b. Echo 04/2013: EF 45-50%, mild focal basal hypertrophy of septum, grade 1 d/d.  Marland Kitchen Myocardial infarction 09/07/12  . Peripheral vision loss    left  . Renal artery stenosis ([facility])    a. 20% L RAS in 08/2012 by cath. b. Duplex 07/2012: >60% L RAS.  . Stroke, embolic ([place])    a. [id number] post cath.  . Vasovagal syncope    a. 04/2013 - echo stable compared to prior EF 45-50%, negative carotid duplex.    Current Outpatient Prescriptions on File Prior to Visit  Medication Sig Dispense Refill  . aspirin 81 MG EC tablet Take 1  tablet (81 mg total) by mouth daily. 30 tablet 12  . atorvastatin (LIPITOR) 80 MG tablet Take 1 tablet (80 mg total) by mouth daily with supper. 90 tablet 3  . canagliflozin (INVOKANA) 100 MG TABS tablet Take 1 tablet (100 mg total) by mouth daily. 100 tablet 0  . carvedilol (COREG) 25 MG tablet Take 1 tablet (25 mg total) by mouth 2 (two) times daily with a meal. 180 tablet 3  . ezetimibe (ZETIA) 10 MG tablet Take 1 tablet (10 mg total) by mouth daily. 30 tablet 5  . furosemide (LASIX) 40 MG tablet Take 1 tablet (40 mg total) by mouth daily. 90 tablet 3  . glucose blood (ONETOUCH VERIO) test strip Use TID 100 each 12  . Insulin Glargine (LANTUS SOLOSTAR) 100 UNIT/ML Solostar Pen Inject 18 units [place] at bedtime. 1 pen 3  . insulin lispro (HUMALOG) 100 UNIT/ML injection Inject into the skin.    Marland Kitchen isosorbide mononitrate (IMDUR) 60 MG 24 hr tablet Take 1 tablet (60 mg total) by mouth daily. 90 tablet 3  . nitroGLYCERIN (NITROSTAT) 0.4 MG SL tablet Place 0.4 mg under the tongue every 5 (five) minutes as needed for chest pain.    . sacubitril-valsartan (ENTRESTO) 49-51 MG Take 1 tablet by mouth 2 (two) times daily. 60 tablet 1  . spironolactone (ALDACTONE) 25 MG tablet Take 1 tablet (25 mg total) by mouth daily. 90 tablet 3   No current facility-administered medications on file prior to visit.     No Known Allergies   Assessment/Plan:  1. Hypertension - BP at goal <130/67mmHg and pt is optimized on HF medications. No medication changes today. Checking BMET today to ensure stable SCr and K. Follow up as needed.   Miran Kautzman E. Matia Zelada, PharmD, CPP, [facility] [id number], [place], [place] [address] Phone: [phone]; Fax: [phone] 11/17/2016 4:07 PM

## 2016-11-17 NOTE — Telephone Encounter (Signed)
I called the patient and told her we could not fit her in for her cardiac MRI today.  Our next available cardiac MRI is for 11-22-16 at 8 a.m., and the patient agreed to take this appointment.

## 2016-11-18 LAB — BASIC METABOLIC PANEL
BUN/Creatinine Ratio: 21 (ref 12–28)
BUN: 18 mg/dL (ref 8–27)
CO2: 22 mmol/L (ref 18–29)
Calcium: 9.7 mg/dL (ref 8.7–10.3)
Chloride: 92 mmol/L — ABNORMAL LOW (ref 96–106)
Creatinine, Ser: 0.87 mg/dL (ref 0.57–1.00)
GFR calc Af Amer: 81 mL/min/{1.73_m2} (ref >59)
GFR calc non Af Amer: 70 mL/min/{1.73_m2} (ref >59)
Glucose: 465 mg/dL — ABNORMAL HIGH (ref 65–99)
Potassium: 4.2 mmol/L (ref 3.5–5.2)
Sodium: 132 mmol/L — ABNORMAL LOW (ref 134–144)

## 2016-11-21 ENCOUNTER — Other Ambulatory Visit: Payer: Managed Care, Other (non HMO)

## 2016-11-21 ENCOUNTER — Ambulatory Visit: Payer: Managed Care, Other (non HMO)

## 2016-11-22 ENCOUNTER — Ambulatory Visit (HOSPITAL_COMMUNITY)
Admission: RE | Admit: 2016-11-22 | Discharge: 2016-11-22 | Disposition: A | Discharge status: Home / Self Care | Payer: Managed Care, Other (non HMO) | Source: Ambulatory Visit | Attending: Cardiology | Admitting: Cardiology

## 2016-11-22 DIAGNOSIS — I517 Cardiomegaly: Secondary | ICD-10-CM | POA: Diagnosis not present

## 2016-11-22 DIAGNOSIS — I5022 Chronic systolic (congestive) heart failure: Secondary | ICD-10-CM | POA: Diagnosis present

## 2016-11-22 DIAGNOSIS — I340 Nonrheumatic mitral (valve) insufficiency: Secondary | ICD-10-CM | POA: Insufficient documentation

## 2016-11-22 DIAGNOSIS — I429 Cardiomyopathy, unspecified: Secondary | ICD-10-CM | POA: Diagnosis not present

## 2016-11-22 MED ORDER — GADOBENATE DIMEGLUMINE 529 MG/ML IV SOLN
23.0000 mL | Freq: Once | INTRAVENOUS | Status: AC
  Administered 2016-11-22: 23 mL via INTRAVENOUS

## 2016-11-23 ENCOUNTER — Telehealth: Payer: Self-pay | Admitting: Physician Assistant

## 2016-11-23 ENCOUNTER — Encounter: Payer: Self-pay | Admitting: *Deleted

## 2016-11-23 NOTE — Telephone Encounter (Signed)
Returned pts call re: lab results. Left detailed message, per pt request.

## 2016-11-23 NOTE — Telephone Encounter (Signed)
Marisa Forbes is returning you call, she states that it is okay to leave the results on her phone voicemail, as she will not be able to answer the phone until 6:30pm. Thanks.

## 2016-11-29 ENCOUNTER — Telehealth: Payer: Self-pay | Admitting: Internal Medicine

## 2016-11-29 NOTE — Telephone Encounter (Signed)
Follow Up   Pt calling regarding lab results. Requesting call back

## 2016-12-22 ENCOUNTER — Telehealth: Payer: Self-pay | Admitting: Interventional Cardiology

## 2016-12-22 ENCOUNTER — Encounter (HOSPITAL_COMMUNITY): Payer: Self-pay | Admitting: Emergency Medicine

## 2016-12-22 ENCOUNTER — Emergency Department (HOSPITAL_COMMUNITY): Payer: Managed Care, Other (non HMO)

## 2016-12-22 ENCOUNTER — Inpatient Hospital Stay (HOSPITAL_COMMUNITY)
Admission: EM | Admit: 2016-12-22 | Discharge: 2016-12-24 | DRG: 202 | Disposition: A | Discharge status: Home / Self Care | Payer: Managed Care, Other (non HMO) | Attending: Internal Medicine | Admitting: Internal Medicine

## 2016-12-22 DIAGNOSIS — E1165 Type 2 diabetes mellitus with hyperglycemia: Secondary | ICD-10-CM

## 2016-12-22 DIAGNOSIS — R0602 Shortness of breath: Secondary | ICD-10-CM | POA: Diagnosis not present

## 2016-12-22 DIAGNOSIS — H547 Unspecified visual loss: Secondary | ICD-10-CM | POA: Diagnosis present

## 2016-12-22 DIAGNOSIS — I2510 Atherosclerotic heart disease of native coronary artery without angina pectoris: Secondary | ICD-10-CM | POA: Diagnosis not present

## 2016-12-22 DIAGNOSIS — J208 Acute bronchitis due to other specified organisms: Secondary | ICD-10-CM | POA: Diagnosis not present

## 2016-12-22 DIAGNOSIS — Z794 Long term (current) use of insulin: Secondary | ICD-10-CM | POA: Diagnosis not present

## 2016-12-22 DIAGNOSIS — I10 Essential (primary) hypertension: Secondary | ICD-10-CM | POA: Diagnosis not present

## 2016-12-22 DIAGNOSIS — I420 Dilated cardiomyopathy: Secondary | ICD-10-CM | POA: Diagnosis present

## 2016-12-22 DIAGNOSIS — R7989 Other specified abnormal findings of blood chemistry: Secondary | ICD-10-CM

## 2016-12-22 DIAGNOSIS — I255 Ischemic cardiomyopathy: Secondary | ICD-10-CM | POA: Diagnosis present

## 2016-12-22 DIAGNOSIS — J189 Pneumonia, unspecified organism: Secondary | ICD-10-CM | POA: Diagnosis not present

## 2016-12-22 DIAGNOSIS — I5022 Chronic systolic (congestive) heart failure: Secondary | ICD-10-CM | POA: Diagnosis present

## 2016-12-22 DIAGNOSIS — E1140 Type 2 diabetes mellitus with diabetic neuropathy, unspecified: Secondary | ICD-10-CM | POA: Diagnosis present

## 2016-12-22 DIAGNOSIS — I2583 Coronary atherosclerosis due to lipid rich plaque: Secondary | ICD-10-CM | POA: Diagnosis not present

## 2016-12-22 DIAGNOSIS — Z9221 Personal history of antineoplastic chemotherapy: Secondary | ICD-10-CM

## 2016-12-22 DIAGNOSIS — E119 Type 2 diabetes mellitus without complications: Secondary | ICD-10-CM

## 2016-12-22 DIAGNOSIS — E1142 Type 2 diabetes mellitus with diabetic polyneuropathy: Secondary | ICD-10-CM

## 2016-12-22 DIAGNOSIS — E785 Hyperlipidemia, unspecified: Secondary | ICD-10-CM | POA: Diagnosis present

## 2016-12-22 DIAGNOSIS — Z923 Personal history of irradiation: Secondary | ICD-10-CM

## 2016-12-22 DIAGNOSIS — I252 Old myocardial infarction: Secondary | ICD-10-CM

## 2016-12-22 DIAGNOSIS — Z955 Presence of coronary angioplasty implant and graft: Secondary | ICD-10-CM

## 2016-12-22 DIAGNOSIS — E1159 Type 2 diabetes mellitus with other circulatory complications: Secondary | ICD-10-CM

## 2016-12-22 DIAGNOSIS — I248 Other forms of acute ischemic heart disease: Secondary | ICD-10-CM | POA: Diagnosis present

## 2016-12-22 DIAGNOSIS — R748 Abnormal levels of other serum enzymes: Secondary | ICD-10-CM | POA: Diagnosis not present

## 2016-12-22 DIAGNOSIS — R778 Other specified abnormalities of plasma proteins: Secondary | ICD-10-CM | POA: Diagnosis present

## 2016-12-22 DIAGNOSIS — K219 Gastro-esophageal reflux disease without esophagitis: Secondary | ICD-10-CM | POA: Diagnosis present

## 2016-12-22 DIAGNOSIS — Z8249 Family history of ischemic heart disease and other diseases of the circulatory system: Secondary | ICD-10-CM

## 2016-12-22 DIAGNOSIS — Z853 Personal history of malignant neoplasm of breast: Secondary | ICD-10-CM

## 2016-12-22 DIAGNOSIS — Z8673 Personal history of transient ischemic attack (TIA), and cerebral infarction without residual deficits: Secondary | ICD-10-CM

## 2016-12-22 DIAGNOSIS — E7800 Pure hypercholesterolemia, unspecified: Secondary | ICD-10-CM | POA: Diagnosis present

## 2016-12-22 DIAGNOSIS — I110 Hypertensive heart disease with heart failure: Secondary | ICD-10-CM | POA: Diagnosis present

## 2016-12-22 DIAGNOSIS — Z7982 Long term (current) use of aspirin: Secondary | ICD-10-CM

## 2016-12-22 LAB — CBC
HCT: 44.2 % (ref 36.0–46.0)
Hemoglobin: 15.6 g/dL — ABNORMAL HIGH (ref 12.0–15.0)
MCH: 29.7 pg (ref 26.0–34.0)
MCHC: 35.3 g/dL (ref 30.0–36.0)
MCV: 84.2 fL (ref 78.0–100.0)
Platelets: 233 10*3/uL (ref 150–400)
RBC: 5.25 MIL/uL — ABNORMAL HIGH (ref 3.87–5.11)
RDW: 13.4 % (ref 11.5–15.5)
WBC: 6.5 10*3/uL (ref 4.0–10.5)

## 2016-12-22 LAB — GLUCOSE, CAPILLARY: Glucose-Capillary: 292 mg/dL — ABNORMAL HIGH (ref 65–99)

## 2016-12-22 LAB — BASIC METABOLIC PANEL
Anion gap: 13 (ref 5–15)
BUN: 20 mg/dL (ref 6–20)
CO2: 27 mmol/L (ref 22–32)
Calcium: 10.2 mg/dL (ref 8.9–10.3)
Chloride: 93 mmol/L — ABNORMAL LOW (ref 101–111)
Creatinine, Ser: 0.94 mg/dL (ref 0.44–1.00)
GFR calc Af Amer: >60 mL/min (ref >60)
GFR calc non Af Amer: >60 mL/min (ref >60)
Glucose, Bld: 370 mg/dL — ABNORMAL HIGH (ref 65–99)
Potassium: 3.9 mmol/L (ref 3.5–5.1)
Sodium: 133 mmol/L — ABNORMAL LOW (ref 135–145)

## 2016-12-22 LAB — I-STAT TROPONIN, ED: Troponin i, poc: 0.62 ng/mL (ref 0.00–0.08)

## 2016-12-22 MED ORDER — ENOXAPARIN SODIUM 40 MG/0.4ML ~~LOC~~ SOLN
40.0000 mg | Freq: Every day | SUBCUTANEOUS | Status: DC
  Administered 2016-12-22 – 2016-12-23 (×2): 40 mg via SUBCUTANEOUS
  Filled 2016-12-22 (×2): qty 0.4

## 2016-12-22 MED ORDER — INSULIN ASPART 100 UNIT/ML ~~LOC~~ SOLN
0.0000 [IU] | Freq: Every day | SUBCUTANEOUS | Status: DC
  Administered 2016-12-22 – 2016-12-23 (×2): 3 [IU] via SUBCUTANEOUS

## 2016-12-22 MED ORDER — SODIUM CHLORIDE 0.9 % IV SOLN: 250.0000 mL | INTRAVENOUS | Status: DC | PRN

## 2016-12-22 MED ORDER — PIPERACILLIN-TAZOBACTAM 3.375 G IVPB 30 MIN
3.3750 g | Freq: Once | INTRAVENOUS | Status: AC
  Administered 2016-12-22: 3.375 g via INTRAVENOUS
  Filled 2016-12-22: qty 50

## 2016-12-22 MED ORDER — ASPIRIN EC 81 MG PO TBEC
81.0000 mg | DELAYED_RELEASE_TABLET | Freq: Every day | ORAL | Status: DC
  Administered 2016-12-23 – 2016-12-24 (×2): 81 mg via ORAL
  Filled 2016-12-22 (×2): qty 1

## 2016-12-22 MED ORDER — ALBUTEROL SULFATE (2.5 MG/3ML) 0.083% IN NEBU: 2.5000 mg | INHALATION_SOLUTION | RESPIRATORY_TRACT | Status: DC | PRN

## 2016-12-22 MED ORDER — SPIRONOLACTONE 25 MG PO TABS
25.0000 mg | ORAL_TABLET | Freq: Every day | ORAL | Status: DC
  Administered 2016-12-23 – 2016-12-24 (×2): 25 mg via ORAL
  Filled 2016-12-22 (×2): qty 1

## 2016-12-22 MED ORDER — SACUBITRIL-VALSARTAN 49-51 MG PO TABS
1.0000 | ORAL_TABLET | Freq: Two times a day (BID) | ORAL | Status: DC
  Administered 2016-12-22 – 2016-12-24 (×4): 1 via ORAL
  Filled 2016-12-22 (×4): qty 1

## 2016-12-22 MED ORDER — IOPAMIDOL (ISOVUE-370) INJECTION 76%
INTRAVENOUS | Status: AC
  Administered 2016-12-22: 21:00:00
  Filled 2016-12-22: qty 100

## 2016-12-22 MED ORDER — CARVEDILOL 25 MG PO TABS
25.0000 mg | ORAL_TABLET | Freq: Two times a day (BID) | ORAL | Status: DC
  Administered 2016-12-23 – 2016-12-24 (×3): 25 mg via ORAL
  Filled 2016-12-22 (×3): qty 1

## 2016-12-22 MED ORDER — ASPIRIN 81 MG PO CHEW
324.0000 mg | CHEWABLE_TABLET | Freq: Once | ORAL | Status: AC
  Administered 2016-12-22: 324 mg via ORAL
  Filled 2016-12-22: qty 4

## 2016-12-22 MED ORDER — ONDANSETRON HCL 4 MG PO TABS: 4.0000 mg | ORAL_TABLET | Freq: Four times a day (QID) | ORAL | Status: DC | PRN

## 2016-12-22 MED ORDER — ATORVASTATIN CALCIUM 40 MG PO TABS
80.0000 mg | ORAL_TABLET | Freq: Every day | ORAL | Status: DC
  Administered 2016-12-23: 80 mg via ORAL
  Filled 2016-12-22: qty 2

## 2016-12-22 MED ORDER — ONDANSETRON HCL 4 MG/2ML IJ SOLN: 4.0000 mg | Freq: Four times a day (QID) | INTRAMUSCULAR | Status: DC | PRN

## 2016-12-22 MED ORDER — ISOSORBIDE MONONITRATE ER 30 MG PO TB24
60.0000 mg | ORAL_TABLET | Freq: Every day | ORAL | Status: DC
  Administered 2016-12-23 – 2016-12-24 (×2): 60 mg via ORAL
  Filled 2016-12-22 (×2): qty 2

## 2016-12-22 MED ORDER — VANCOMYCIN HCL 500 MG IV SOLR
500.0000 mg | Freq: Two times a day (BID) | INTRAVENOUS | Status: DC
  Administered 2016-12-23 – 2016-12-24 (×3): 500 mg via INTRAVENOUS
  Filled 2016-12-22 (×4): qty 500

## 2016-12-22 MED ORDER — GUAIFENESIN ER 600 MG PO TB12
600.0000 mg | ORAL_TABLET | Freq: Two times a day (BID) | ORAL | Status: DC
  Administered 2016-12-22 – 2016-12-24 (×4): 600 mg via ORAL
  Filled 2016-12-22 (×4): qty 1

## 2016-12-22 MED ORDER — ACETAMINOPHEN 325 MG PO TABS: 650.0000 mg | ORAL_TABLET | Freq: Four times a day (QID) | ORAL | Status: DC | PRN

## 2016-12-22 MED ORDER — DEXTROSE 5 % IV SOLN
1.0000 g | Freq: Three times a day (TID) | INTRAVENOUS | Status: DC
  Administered 2016-12-23 – 2016-12-24 (×5): 1 g via INTRAVENOUS
  Filled 2016-12-22 (×6): qty 1

## 2016-12-22 MED ORDER — SODIUM CHLORIDE 0.9% FLUSH: 3.0000 mL | Freq: Two times a day (BID) | INTRAVENOUS | Status: DC

## 2016-12-22 MED ORDER — ACETAMINOPHEN 650 MG RE SUPP: 650.0000 mg | Freq: Four times a day (QID) | RECTAL | Status: DC | PRN

## 2016-12-22 MED ORDER — IOPAMIDOL (ISOVUE-370) INJECTION 76%
100.0000 mL | Freq: Once | INTRAVENOUS | Status: AC | PRN
  Administered 2016-12-22: 100 mL via INTRAVENOUS

## 2016-12-22 MED ORDER — EZETIMIBE 10 MG PO TABS
10.0000 mg | ORAL_TABLET | Freq: Every day | ORAL | Status: DC
  Administered 2016-12-23 – 2016-12-24 (×2): 10 mg via ORAL
  Filled 2016-12-22 (×2): qty 1

## 2016-12-22 MED ORDER — SODIUM CHLORIDE 0.9% FLUSH
3.0000 mL | Freq: Two times a day (BID) | INTRAVENOUS | Status: DC
  Administered 2016-12-23 – 2016-12-24 (×2): 3 mL via INTRAVENOUS

## 2016-12-22 MED ORDER — SODIUM CHLORIDE 0.9% FLUSH
3.0000 mL | INTRAVENOUS | Status: DC | PRN
Start: 2016-12-22 — End: 2016-12-24

## 2016-12-22 MED ORDER — FUROSEMIDE 40 MG PO TABS
40.0000 mg | ORAL_TABLET | Freq: Every day | ORAL | Status: DC
  Administered 2016-12-23 – 2016-12-24 (×2): 40 mg via ORAL
  Filled 2016-12-22 (×2): qty 1

## 2016-12-22 MED ORDER — INSULIN GLARGINE 100 UNIT/ML ~~LOC~~ SOLN
18.0000 [IU] | Freq: Every day | SUBCUTANEOUS | Status: DC
  Administered 2016-12-22 – 2016-12-23 (×2): 18 [IU] via SUBCUTANEOUS
  Filled 2016-12-22 (×3): qty 0.18

## 2016-12-22 MED ORDER — INSULIN ASPART 100 UNIT/ML ~~LOC~~ SOLN
0.0000 [IU] | Freq: Three times a day (TID) | SUBCUTANEOUS | Status: DC
  Administered 2016-12-23: 8 [IU] via SUBCUTANEOUS
  Administered 2016-12-23: 5 [IU] via SUBCUTANEOUS
  Administered 2016-12-23: 1 [IU] via SUBCUTANEOUS
  Administered 2016-12-24 (×2): 8 [IU] via SUBCUTANEOUS

## 2016-12-22 MED ORDER — VANCOMYCIN HCL 10 G IV SOLR
1250.0000 mg | Freq: Once | INTRAVENOUS | Status: AC
  Administered 2016-12-22: 1250 mg via INTRAVENOUS
  Filled 2016-12-22: qty 1250

## 2016-12-22 NOTE — ED Triage Notes (Signed)
Pt complaint of "chest congestion" onset 1400 today. Pt describes it as "I can hear it." Pt continues to verbalize seen here a few weeks ago for CHF. Pt denies pain.

## 2016-12-22 NOTE — ED Provider Notes (Signed)
[facility] DEPT Provider Note   CSN: [id number] Arrival date & time: 12/22/16  1627     History   Chief Complaint Chief Complaint  Patient presents with  . Shortness of Breath    HPI Marisa Forbes is a 66 y.o. female.  66 yo F with a chief complaint of cough. This going on for about a week. She feels like something is stuck in rattling in her chest. She says this feels just like a month ago when she was admitted to [facility]. At that time she felt that it was worse. She was diagnosed with acute heart failure. She had a recent nuclear study that showed that her EF was preserved about a month ago. She denies chest pain denies diaphoresis denies exertional symptoms.   The history is provided by the patient.  Shortness of Breath  This is a new problem. The problem occurs rarely.The current episode started less than 1 hour ago. The problem has not changed since onset.Pertinent negatives include no fever, no headaches, no rhinorrhea, no wheezing, no chest pain and no vomiting. She has tried nothing for the symptoms. The treatment provided no relief. She has had prior hospitalizations. She has had prior ED visits. She has had no prior ICU admissions. Associated medical issues include CAD.    Past Medical History:  Diagnosis Date  . Arthritis    back  . Balance problems    due to stroke  . Breast cancer ([place]) 12/03/12   a. 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 S/p chemo with CMF and radiation.  . CHF (congestive heart failure) ([place])    On Monday she said her cardiologist was concerned for possible CHF, but is now improving-per pt.(07/26/15)  . Coronary artery disease    a. STEMI 08/2012: s/p DES to LAD, PTCA to downstream LAD. b. Relook cath same hospitalization: stable post-PCI anatomy with Stable Diag lesions (unlikely cause of resting angina, not good PCI targets), stable RCA lesion (non flow limiting).  . Diabetes mellitus without  complication ([place])    Takes insulin and metformin  . GERD (gastroesophageal reflux disease)    food related  . History of cancer chemotherapy    last in August 2014  . History of radiation therapy 06/23/2013-08/08/2013   62.4 gray to left breast  . HTN (hypertension)   . Hypercholesteremia   . LV dysfunction    a. EF 45-50% at time of STEMI 08/2012. b. Echo 04/2013: EF 45-50%, mild focal basal hypertrophy of septum, grade 1 d/d.  Marland Kitchen Myocardial infarction 09/07/12  . Peripheral vision loss    left  . Renal artery stenosis ([facility])    a. 20% L RAS in 08/2012 by cath. b. Duplex 07/2012: >60% L RAS.  . Stroke, embolic ([place])    a. [id number] post cath.  . Vasovagal syncope    a. 04/2013 - echo stable compared to prior EF 45-50%, negative carotid duplex.    Patient Active Problem List   Diagnosis Date Noted  . Chest pain, rule out acute myocardial infarction 07/29/2015  . 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 Type 2 diabetes mellitus with other diabetic neurological complication   . Ischemic cardiomyopathy   . 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 3rd cranial nerve palsy   . Diplopia   . CVA (cerebral infarction) 04/11/2015  . Renal artery stenosis ([place])   . LV dysfunction   . Benign paroxysmal positional vertigo 05/19/2013  . Cancer of  upper-inner quadrant of female breast ([place]) 12/06/2012  . Cervical dystonia 10/18/2012  . Peripheral neuropathy ([place]) 10/18/2012  . Other screening mammogram 10/03/2012  . Stroke, embolic ([place]) [id number]    Class: Acute  . Hyperlipidemia with target LDL less than 70 09/09/2012  . CAD - STEMI s/p urgent LAD DES 09/07/12, residual RCA and Dx disease, EF 45-50% 09/09/2012  . NSVT, 5 beats 11/10 09/09/2012  . Essential hypertension 09/07/2012    Past Surgical History:  Procedure Laterality Date  . BACK SURGERY     x3, lower back  . BREAST LUMPECTOMY WITH NEEDLE LOCALIZATION AND AXILLARY SENTINEL LYMPH NODE BX Left 01/08/2013   Procedure:  LEFT BREAST WIRE GUIDED  LUMPECTOMY AND LEFT AXILLARY SENTINEL  NODE BX;  Surgeon: Rolm Bookbinder, MD;  Location: [facility];  Service: General;  Laterality: Left;  . CATARACT EXTRACTION Bilateral   . CORONARY ANGIOPLASTY WITH STENT PLACEMENT    . LEFT HEART CATHETERIZATION WITH CORONARY ANGIOGRAM N/A 09/07/2012   Procedure: LEFT HEART CATHETERIZATION WITH CORONARY ANGIOGRAM;  Surgeon: Troy Sine, MD;  Location[facility] CATH LAB;  Service: Cardiovascular;  Laterality: N/A;  . LEFT HEART CATHETERIZATION WITH CORONARY ANGIOGRAM  09/09/2012   Procedure: LEFT HEART CATHETERIZATION WITH CORONARY ANGIOGRAM;  Surgeon: Leonie Man, MD;  Location: [facility] CATH LAB;  Service: Cardiovascular;;  . PERCUTANEOUS CORONARY STENT INTERVENTION (PCI-S) N/A 09/07/2012   Procedure: PERCUTANEOUS CORONARY STENT INTERVENTION (PCI-S);  Surgeon: Troy Sine, MD;  Location: [facility] CATH LAB;  Service: Cardiovascular;  Laterality: N/A;  . PORT-A-CATH REMOVAL N/A 10/14/2013   Procedure: REMOVAL PORT-A-CATH;  Surgeon: Rolm Bookbinder, MD;  Location: [facility] ORS;  Service: General;  Laterality: N/A;  . PORTACATH PLACEMENT Right 02/17/2013   Procedure: INSERTION PORT-A-CATH;  Surgeon: Rolm Bookbinder, MD;  Location: [facility];  Service: General;  Laterality: Right;    OB History    No data available       Home Medications    Prior to Admission medications   Medication Sig Start Date End Date Taking? Authorizing Provider  aspirin 81 MG EC tablet Take 1 tablet (81 mg total) by mouth daily. 04/16/15  Yes Barton Dubois, MD  atorvastatin (LIPITOR) 80 MG tablet Take 1 tablet (80 mg total) by mouth daily with supper. 04/16/15  Yes Barton Dubois, MD  carvedilol (COREG) 25 MG tablet Take 1 tablet (25 mg total) by mouth 2 (two) times daily with a meal. 04/07/16  Yes Fay Records, MD  ezetimibe (ZETIA) 10 MG tablet Take 1 tablet (10 mg total) by mouth daily. 04/07/16  Yes Fay Records, MD  furosemide (LASIX) 40 MG tablet Take 1 tablet (40 mg total)  by mouth daily. 11/08/16 12/22/16 Yes Eileen Stanford, PA-C  Insulin Glargine (LANTUS SOLOSTAR) 100 UNIT/ML Solostar Pen Inject 18 units [place] at bedtime. 04/16/15  Yes Barton Dubois, MD  insulin lispro (HUMALOG) 100 UNIT/ML injection Inject 15-18 Units into the skin every morning.    Yes Historical Provider, MD  isosorbide mononitrate (IMDUR) 60 MG 24 hr tablet Take 1 tablet (60 mg total) by mouth daily. 04/07/16  Yes Fay Records, MD  sacubitril-valsartan (ENTRESTO) 49-51 MG Take 1 tablet by mouth 2 (two) times daily. 11/08/16  Yes Eileen Stanford, PA-C  spironolactone (ALDACTONE) 25 MG tablet Take 1 tablet (25 mg total) by mouth daily. 11/08/16  Yes Eileen Stanford, PA-C  glucose blood [facility] VERIO) test strip Use TID 10/03/12   Janith Lima, MD  nitroGLYCERIN (NITROSTAT) 0.4 MG SL  tablet Place 0.4 mg under the tongue every 5 (five) minutes as needed for chest pain. 09/13/12   Brett Canales, PA-C    Family History Family History  Problem Relation Age of Onset  . Heart disease Father   . Hypertension Father   . Cancer Neg Hx   . Diabetes Neg Hx   . Hyperlipidemia Neg Hx   . Kidney disease Neg Hx   . Stroke Neg Hx     Social History Social History  Substance Use Topics  . Smoking status: Never Smoker  . Smokeless tobacco: Never Used  . Alcohol use Yes     Comment: socially     Allergies   Patient has no known allergies.   Review of Systems Review of Systems  Constitutional: Negative for chills and fever.  HENT: Negative for congestion and rhinorrhea.   Eyes: Negative for redness and visual disturbance.  Respiratory: Negative for shortness of breath and wheezing.   Cardiovascular: Negative for chest pain and palpitations.  Gastrointestinal: Negative for nausea and vomiting.  Genitourinary: Negative for dysuria and urgency.  Musculoskeletal: Negative for arthralgias and myalgias.  Skin: Negative for pallor and wound.  Neurological: Negative for dizziness and  headaches.     Physical Exam Updated Vital Signs BP 132/80 (BP Location: Left Arm)   Pulse 89   Temp 98.1 F (36.7 C) (Oral)   Resp 14   Ht 5\' 5"  (1.651 m)   Wt 140 lb (63.5 kg)   SpO2 92%   BMI 23.30 kg/m   Physical Exam  Constitutional: She is oriented to person, place, and time. She appears well-developed and well-nourished. No distress.  HENT:  Head: Normocephalic and atraumatic.  Eyes: EOM are normal. Pupils are equal, round, and reactive to light.  Neck: Normal range of motion. Neck supple.  Cardiovascular: Normal rate and regular rhythm.  Exam reveals no gallop and no friction rub.   No murmur heard. Pulmonary/Chest: Effort normal. She has no wheezes. She has no rales.  Abdominal: Soft. She exhibits no distension. There is no tenderness.  Musculoskeletal: She exhibits no edema or tenderness.  Neurological: She is alert and oriented to person, place, and time.  Skin: Skin is warm and dry. She is not diaphoretic.  Psychiatric: She has a normal mood and affect. Her behavior is normal.  Nursing note and vitals reviewed.    ED Treatments / Results  Labs (all labs ordered are listed, but only abnormal results are displayed) Labs Reviewed  BASIC METABOLIC PANEL - Abnormal; Notable for the following:       Result Value   Sodium 133 (*)    Chloride 93 (*)    Glucose, Bld 370 (*)    All other components within normal limits  CBC - Abnormal; Notable for the following:    RBC 5.25 (*)    Hemoglobin 15.6 (*)    All other components within normal limits  I-STAT TROPOININ, ED - Abnormal; Notable for the following:    Troponin i, poc 0.62 (*)    All other components within normal limits  CULTURE, BLOOD (ROUTINE X 2)  CULTURE, BLOOD (ROUTINE X 2)    EKG  EKG Interpretation  Date/Time:  Friday December 22 2016 17:54:44 EST Ventricular Rate:  97 PR Interval:  188 QRS Duration: 94 QT Interval:  372 QTC Calculation: 472 R Axis:   -65 Text Interpretation:  Normal  sinus rhythm Left axis deviation Septal infarct , age undetermined Abnormal ECG No significant change since  last tracing Confirmed by Cheyan Frees MD, DANIEL [phone]) on 12/22/2016 6:22:49 PM       Radiology Dg Chest 2 View  Result Date: 12/22/2016 CLINICAL DATA:  Chest congestion today. Personal history of congestive heart failure and breast cancer. EXAM: CHEST  2 VIEW COMPARISON:  None. FINDINGS: The heart size and mediastinal contours are within normal limits. Both lungs are clear. The visualized skeletal structures are unremarkable. IMPRESSION: Negative two view chest x-ray Electronically Signed   By: San Morelle M.D.   On: 12/22/2016 17:28   Ct Angio Chest Pe W And/or Wo Contrast  Result Date: 12/22/2016 CLINICAL DATA:  Acute onset of generalized chest congestion and shortness of breath. Initial encounter. EXAM: CT ANGIOGRAPHY CHEST WITH CONTRAST TECHNIQUE: Multidetector CT imaging of the chest was performed using the standard protocol during bolus administration of intravenous contrast. Multiplanar CT image reconstructions and MIPs were obtained to evaluate the vascular anatomy. CONTRAST:  100 mL of Isovue 370 IV contrast COMPARISON:  Chest radiograph performed earlier today at 5:20 p.m. FINDINGS: Cardiovascular:  There is no evidence of pulmonary embolus. Diffuse coronary artery calcifications are seen. The heart remains normal in size. Scattered calcification noted along the aortic arch and descending thoracic aorta. The great vessels are grossly unremarkable in appearance. Mediastinum/Nodes: The mediastinum is unremarkable in appearance. No mediastinal lymphadenopathy is seen. No pericardial effusion is identified. The thyroid gland is unremarkable. No axillary lymphadenopathy is seen. Lungs/Pleura: Focal airspace opacity at the left lung base is compatible with pneumonia. No pleural effusion or pneumothorax is seen. No masses are identified. Upper Abdomen: The visualized portions of the liver  and spleen are grossly unremarkable. Musculoskeletal: No acute osseous abnormalities are identified. Mild degenerative change is noted at the lower cervical spine. The visualized musculature is unremarkable in appearance. Review of the MIP images confirms the above findings. IMPRESSION: 1. No evidence of pulmonary embolus. 2. Focal airspace opacity at the left lung base is compatible with pneumonia. 3. Diffuse coronary artery calcifications seen. Electronically Signed   By: Garald Balding M.D.   On: 12/22/2016 20:01    Procedures Procedures (including critical care time)  Medications Ordered in ED Medications  vancomycin (VANCOCIN) 1,250 mg in sodium chloride 0.9 % 250 mL IVPB (not administered)  piperacillin-tazobactam (ZOSYN) IVPB 3.375 g (not administered)  aspirin chewable tablet 324 mg (324 mg Oral Given 12/22/16 1917)  iopamidol (ISOVUE-370) 76 % injection (  Contrast Given 12/22/16 2045)  iopamidol (ISOVUE-370) 76 % injection 100 mL (100 mLs Intravenous Contrast Given 12/22/16 1940)     Initial Impression / Assessment and Plan / ED Course  I have reviewed the triage vital signs and the nursing notes.  Pertinent labs & imaging results that were available during my care of the patient were reviewed by me and considered in my medical decision making (see chart for details).     66 yo F With a chief complaint of cough. This been going on for the past week. She feels like something stuck in her chest. She had a positive troponin at 0.6. PET scan with a cold pneumonia. She was just in the hospital will start on HCAP Antibiotics.  I discussed the case with cardiology, Dr. William Dalton, as the patient has no chest pain no EKG changes and no shortness of breath when asked her he feels is likely demand ischemia based on the pneumonia. Recommended cycling troponins and cardiology would see her in the morning.  CRITICAL CARE Performed by: Cecilio Asper   Total  critical care time: 35  minutes  Critical care time was exclusive of separately billable procedures and treating other patients.  Critical care was necessary to treat or prevent imminent or life-threatening deterioration.  Critical care was time spent personally by me on the following activities: development of treatment plan with patient and/or surrogate as well as nursing, discussions with consultants, evaluation of patient's response to treatment, examination of patient, obtaining history from patient or surrogate, ordering and performing treatments and interventions, ordering and review of laboratory studies, ordering and review of radiographic studies, pulse oximetry and re-evaluation of patient's condition.  The patients results and plan were reviewed and discussed.   Any x-rays performed were independently reviewed by myself.   Differential diagnosis were considered with the presenting HPI.  Medications  vancomycin (VANCOCIN) 1,250 mg in sodium chloride 0.9 % 250 mL IVPB (not administered)  piperacillin-tazobactam (ZOSYN) IVPB 3.375 g (not administered)  aspirin chewable tablet 324 mg (324 mg Oral Given 12/22/16 1917)  iopamidol (ISOVUE-370) 76 % injection (  Contrast Given 12/22/16 2045)  iopamidol (ISOVUE-370) 76 % injection 100 mL (100 mLs Intravenous Contrast Given 12/22/16 1940)    Vitals:   12/22/16 1657 12/22/16 1810 12/22/16 2035 12/22/16 2044  BP: 137/89 132/80    Pulse: 93 89    Resp: 18 14    Temp: 98.1 F (36.7 C)     TempSrc: Oral     SpO2: 97% 92%    Weight:   140 lb (63.5 kg) 140 lb (63.5 kg)  Height:   5\' 5"  (1.651 m) 5\' 5"  (1.651 m)    Final diagnoses:  HCAP (healthcare-associated pneumonia)    Admission/ observation were discussed with the admitting physician, patient and/or family and they are comfortable with the plan.   Final Clinical Impressions(s) / ED Diagnoses   Final diagnoses:  HCAP (healthcare-associated pneumonia)    New Prescriptions New Prescriptions   No  medications on file     Deno Etienne, DO 12/22/16 2055

## 2016-12-22 NOTE — Telephone Encounter (Addendum)
Called from [facility] regarding Ms. Blomgren for possible transfer. Patient with history of CAD, chronic systolic CHF, HTN, DMT2, HLD, and breast CA presented with worsening cough, and found to have pneumonia on CT scan. First troponin found to be 0.6. Patient with no chest pain and no acute ECG changes. Enzymes likely represent demand ischemia. I think patient can stay at [facility], I would treat underlying infection, cycle cardiac enzymes, and will be seen by cardiology in AM.

## 2016-12-22 NOTE — H&P (Addendum)
Marisa Forbes [id number] DOB: 1951/09/18 DOA: 12/22/2016     PCP: Scarlette Calico, MD   Outpatient Specialists: [facility]  Patient coming from:    home Lives  With family   Chief Complaint: cough  HPI: Marisa Forbes is a 66 y.o. female with medical history significant of HTN, breast CA, DM, CAD and embolic CVA. At one point  LVEF 20 to 25% improved to LVEF 40 to 45% Echo done on 9/15     Presented with chest congestion states she can hear her breath. She denied any chest pain no associated fevers she had a little cough associated this. Reports some sensation of feedings being stuck in her chest and rattling. She have had similar symptoms last month. Anicteric that time was told she has CHF exacerbation..  She is endorsing somewhat worsening shortness of breath he denies any fevers or chills no wheezing  Regarding pertinent Chronic problems: Regarding history of coronary artery disease patient status post ST elevation MI in 2013 status post DES to LAD and PTCA to downstream LAD she had an embolic stroke post catheterization. Patient has known history of breast cancer status post chemotherapy in 2014 Diabetes mellitus on insulin IN ER:  Temp (24hrs), Avg:98.1 F (36.7 C), Min:98.1 F (36.7 C), Max:98.1 F (36.7 C)   RR 14 oxygen 92% HR 89 BP 132/80  Trop 0.62 Na 133 BUN 20 Cr  0.94 WBC 6.5 Hg 15.6 CT chest focal airspace left lung base  Following Medications were ordered in ER: Medications  vancomycin (VANCOCIN) 1,250 mg in sodium chloride 0.9 % 250 mL IVPB (1,250 mg Intravenous New Bag/Given 12/22/16 2059)  piperacillin-tazobactam (ZOSYN) IVPB 3.375 g (3.375 g Intravenous New Bag/Given 12/22/16 2059)  aspirin chewable tablet 324 mg (324 mg Oral Given 12/22/16 1917)  iopamidol (ISOVUE-370) 76 % injection (  Contrast Given 12/22/16 2045)  iopamidol (ISOVUE-370) 76 % injection 100 mL (100 mLs Intravenous Contrast Given 12/22/16 1940)     ER provider discussed case  with:  Cardiology given elevated troponin recommends admission to medicine at [facility] long and cycling cardiac enzymes  Hospitalist was called for admission for CAP   Review of Systems:    Pertinent positives include: fatigue,shortness of breath at rest. productive cough,   Constitutional:  No weight loss, night sweats, Fevers, chills,  weight loss  HEENT:  No headaches, Difficulty swallowing,Tooth/dental problems,Sore throat,  No sneezing, itching, ear ache, nasal congestion, post nasal drip,  Cardio-vascular:  No chest pain, Orthopnea, PND, anasarca, dizziness, palpitations.no Bilateral lower extremity swelling  GI:  No heartburn, indigestion, abdominal pain, nausea, vomiting, diarrhea, change in bowel habits, loss of appetite, melena, blood in stool, hematemesis Resp:  no No dyspnea on exertion, No excess mucus,   No non-productive cough, No coughing up of blood.No change in color of mucus.No wheezing. Skin:  no rash or lesions. No jaundice GU:  no dysuria, change in color of urine, no urgency or frequency. No straining to urinate.  No flank pain.  Musculoskeletal:  No joint pain or no joint swelling. No decreased range of motion. No back pain.  Psych:  No change in mood or affect. No depression or anxiety. No memory loss.  Neuro: no localizing neurological complaints, no tingling, no weakness, no double vision, no gait abnormality, no slurred speech, no confusion  As per HPI otherwise 10 point review of systems negative.   Past Medical History: Past Medical History:  Diagnosis Date  . Arthritis    back  .  Balance problems    due to stroke  . Breast cancer ([place]) 12/03/12   a. 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 S/p chemo with CMF and radiation.  . CHF (congestive heart failure) ([place])    On Monday she said her cardiologist was concerned for possible CHF, but is now improving-per pt.(07/26/15)  . Coronary artery disease    a. STEMI 08/2012:  s/p DES to LAD, PTCA to downstream LAD. b. Relook cath same hospitalization: stable post-PCI anatomy with Stable Diag lesions (unlikely cause of resting angina, not good PCI targets), stable RCA lesion (non flow limiting).  . Diabetes mellitus without complication ([place])    Takes insulin and metformin  . GERD (gastroesophageal reflux disease)    food related  . History of cancer chemotherapy    last in August 2014  . History of radiation therapy 06/23/2013-08/08/2013   62.4 gray to left breast  . HTN (hypertension)   . Hypercholesteremia   . LV dysfunction    a. EF 45-50% at time of STEMI 08/2012. b. Echo 04/2013: EF 45-50%, mild focal basal hypertrophy of septum, grade 1 d/d.  Marland Kitchen Myocardial infarction 09/07/12  . Peripheral vision loss    left  . Renal artery stenosis ([facility])    a. 20% L RAS in 08/2012 by cath. b. Duplex 07/2012: >60% L RAS.  . Stroke, embolic ([place])    a. [id number] post cath.  . Vasovagal syncope    a. 04/2013 - echo stable compared to prior EF 45-50%, negative carotid duplex.   Past Surgical History:  Procedure Laterality Date  . BACK SURGERY     x3, lower back  . BREAST LUMPECTOMY WITH NEEDLE LOCALIZATION AND AXILLARY SENTINEL LYMPH NODE BX Left 01/08/2013   Procedure: LEFT BREAST WIRE GUIDED  LUMPECTOMY AND LEFT AXILLARY SENTINEL  NODE BX;  Surgeon: Rolm Bookbinder, MD;  Location: [facility];  Service: General;  Laterality: Left;  . CATARACT EXTRACTION Bilateral   . CORONARY ANGIOPLASTY WITH STENT PLACEMENT    . LEFT HEART CATHETERIZATION WITH CORONARY ANGIOGRAM N/A 09/07/2012   Procedure: LEFT HEART CATHETERIZATION WITH CORONARY ANGIOGRAM;  Surgeon: Troy Sine, MD;  Location[facility] CATH LAB;  Service: Cardiovascular;  Laterality: N/A;  . LEFT HEART CATHETERIZATION WITH CORONARY ANGIOGRAM  09/09/2012   Procedure: LEFT HEART CATHETERIZATION WITH CORONARY ANGIOGRAM;  Surgeon: Leonie Man, MD;  Location: [facility] CATH LAB;  Service: Cardiovascular;;  . PERCUTANEOUS CORONARY STENT  INTERVENTION (PCI-S) N/A 09/07/2012   Procedure: PERCUTANEOUS CORONARY STENT INTERVENTION (PCI-S);  Surgeon: Troy Sine, MD;  Location: [facility] CATH LAB;  Service: Cardiovascular;  Laterality: N/A;  . PORT-A-CATH REMOVAL N/A 10/14/2013   Procedure: REMOVAL PORT-A-CATH;  Surgeon: Rolm Bookbinder, MD;  Location: [facility] ORS;  Service: General;  Laterality: N/A;  . PORTACATH PLACEMENT Right 02/17/2013   Procedure: INSERTION PORT-A-CATH;  Surgeon: Rolm Bookbinder, MD;  Location: [facility];  Service: General;  Laterality: Right;     Social History:  Ambulatory   independently      reports that she has never smoked. She has never used smokeless tobacco. She reports that she drinks alcohol. She reports that she does not use drugs.  Allergies:  No Known Allergies     Family History:   Family History  Problem Relation Age of Onset  . Heart disease Father   . Hypertension Father   . Cancer Neg Hx   . Diabetes Neg Hx   . Hyperlipidemia Neg Hx   . Kidney disease  Neg Hx   . Stroke Neg Hx     Medications: Prior to Admission medications   Medication Sig Start Date End Date Taking? Authorizing Provider  aspirin 81 MG EC tablet Take 1 tablet (81 mg total) by mouth daily. 04/16/15  Yes Barton Dubois, MD  atorvastatin (LIPITOR) 80 MG tablet Take 1 tablet (80 mg total) by mouth daily with supper. 04/16/15  Yes Barton Dubois, MD  carvedilol (COREG) 25 MG tablet Take 1 tablet (25 mg total) by mouth 2 (two) times daily with a meal. 04/07/16  Yes Fay Records, MD  ezetimibe (ZETIA) 10 MG tablet Take 1 tablet (10 mg total) by mouth daily. 04/07/16  Yes Fay Records, MD  furosemide (LASIX) 40 MG tablet Take 1 tablet (40 mg total) by mouth daily. 11/08/16 12/22/16 Yes Eileen Stanford, PA-C  Insulin Glargine (LANTUS SOLOSTAR) 100 UNIT/ML Solostar Pen Inject 18 units [place] at bedtime. 04/16/15  Yes Barton Dubois, MD  insulin lispro (HUMALOG) 100 UNIT/ML injection Inject 15-18 Units into the skin every morning.    Yes  Historical Provider, MD  isosorbide mononitrate (IMDUR) 60 MG 24 hr tablet Take 1 tablet (60 mg total) by mouth daily. 04/07/16  Yes Fay Records, MD  sacubitril-valsartan (ENTRESTO) 49-51 MG Take 1 tablet by mouth 2 (two) times daily. 11/08/16  Yes Eileen Stanford, PA-C  spironolactone (ALDACTONE) 25 MG tablet Take 1 tablet (25 mg total) by mouth daily. 11/08/16  Yes Eileen Stanford, PA-C  glucose blood [facility] VERIO) test strip Use TID 10/03/12   Janith Lima, MD  nitroGLYCERIN (NITROSTAT) 0.4 MG SL tablet Place 0.4 mg under the tongue every 5 (five) minutes as needed for chest pain. 09/13/12   Brett Canales, PA-C    Physical Exam: Patient Vitals for the past 24 hrs:  BP Temp Temp src Pulse Resp SpO2 Height Weight  12/22/16 2044 - - - - - - 5\' 5"  (1.651 m) 63.5 kg (140 lb)  12/22/16 2035 - - - - - - 5\' 5"  (1.651 m) 63.5 kg (140 lb)  12/22/16 1810 132/80 - - 89 14 92 % - -  12/22/16 1657 137/89 98.1 F (36.7 C) Oral 93 18 97 % - -    1. General:  in No Acute distress 2. Psychological: Alert and   Oriented 3. Head/ENT:    Dry Mucous Membranes                          Head Non traumatic, neck supple                            Poor Dentition 4. SKIN:   decreased Skin turgor,  Skin clean Dry and intact no rash 5. Heart: Regular rate and rhythm no  Murmur, Rub or gallop 6. Lungs:   no wheezes occasional crackles   7. Abdomen: Soft,  non-tender, Non distended 8. Lower extremities: no clubbing, cyanosis, or edema 9. Neurologically Grossly intact, moving all 4 extremities equally   10. MSK: Normal range of motion   body mass index is 23.3 kg/m.  Labs on Admission:   Labs on Admission: I have personally reviewed following labs and imaging studies  CBC:  Recent Labs Lab 12/22/16 1722  WBC 6.5  HGB 15.6*  HCT 44.2  MCV 84.2  PLT 0000000   Basic Metabolic Panel:  Recent Labs Lab 12/22/16 1722  NA 133*  K 3.9  CL 93*  CO2 27  GLUCOSE 370*  BUN 20  CREATININE 0.94    CALCIUM 10.2   GFR: Estimated Creatinine Clearance: 53 mL/min (by C-G formula based on SCr of 0.94 mg/dL). Liver Function Tests: No results for input(s): AST, ALT, ALKPHOS, BILITOT, PROT, ALBUMIN in the last 168 hours. No results for input(s): LIPASE, AMYLASE in the last 168 hours. No results for input(s): AMMONIA in the last 168 hours. Coagulation Profile: No results for input(s): INR, PROTIME in the last 168 hours. Cardiac Enzymes: No results for input(s): CKTOTAL, CKMB, CKMBINDEX, TROPONINI in the last 168 hours. BNP (last 3 results) No results for input(s): PROBNP in the last 8760 hours. HbA1C: No results for input(s): HGBA1C in the last 72 hours. CBG: No results for input(s): GLUCAP in the last 168 hours. Lipid Profile: No results for input(s): CHOL, HDL, LDLCALC, TRIG, CHOLHDL, LDLDIRECT in the last 72 hours. Thyroid Function Tests: No results for input(s): TSH, T4TOTAL, FREET4, T3FREE, THYROIDAB in the last 72 hours. Anemia Panel: No results for input(s): VITAMINB12, FOLATE, FERRITIN, TIBC, IRON, RETICCTPCT in the last 72 hours.  Sepsis Labs: @LABRCNTIP (procalcitonin:4,lacticidven:4) )No results found for this or any previous visit (from the past 240 hour(s)).    UA not ordered  Lab Results  Component Value Date   HGBA1C 10.8 (H) 06/01/2015    Estimated Creatinine Clearance: 53 mL/min (by C-G formula based on SCr of 0.94 mg/dL).  BNP (last 3 results) No results for input(s): PROBNP in the last 8760 hours.   ECG REPORT  Independently reviewed Rate: 97  Rhythm: Normal sinus ST&T Change: No acute ischemic changes   QTC 472  Filed Weights   12/22/16 2035 12/22/16 2044  Weight: 63.5 kg (140 lb) 63.5 kg (140 lb)     Cultures:    Component Value Date/Time   SDES URINE, RANDOM 04/12/2015 1115   SPECREQUEST NONE 04/12/2015 1115   CULT  04/12/2015 1115    ESCHERICHIA COLI Performed at [facility] 04/14/2015 FINAL 04/12/2015 1115      Radiological Exams on Admission: Dg Chest 2 View  Result Date: 12/22/2016 CLINICAL DATA:  Chest congestion today. Personal history of congestive heart failure and breast cancer. EXAM: CHEST  2 VIEW COMPARISON:  None. FINDINGS: The heart size and mediastinal contours are within normal limits. Both lungs are clear. The visualized skeletal structures are unremarkable. IMPRESSION: Negative two view chest x-ray Electronically Signed   By: San Morelle M.D.   On: 12/22/2016 17:28   Ct Angio Chest Pe W And/or Wo Contrast  Result Date: 12/22/2016 CLINICAL DATA:  Acute onset of generalized chest congestion and shortness of breath. Initial encounter. EXAM: CT ANGIOGRAPHY CHEST WITH CONTRAST TECHNIQUE: Multidetector CT imaging of the chest was performed using the standard protocol during bolus administration of intravenous contrast. Multiplanar CT image reconstructions and MIPs were obtained to evaluate the vascular anatomy. CONTRAST:  100 mL of Isovue 370 IV contrast COMPARISON:  Chest radiograph performed earlier today at 5:20 p.m. FINDINGS: Cardiovascular:  There is no evidence of pulmonary embolus. Diffuse coronary artery calcifications are seen. The heart remains normal in size. Scattered calcification noted along the aortic arch and descending thoracic aorta. The great vessels are grossly unremarkable in appearance. Mediastinum/Nodes: The mediastinum is unremarkable in appearance. No mediastinal lymphadenopathy is seen. No pericardial effusion is identified. The thyroid gland is unremarkable. No axillary lymphadenopathy is seen. Lungs/Pleura: Focal airspace opacity at the left lung base is compatible with pneumonia.  No pleural effusion or pneumothorax is seen. No masses are identified. Upper Abdomen: The visualized portions of the liver and spleen are grossly unremarkable. Musculoskeletal: No acute osseous abnormalities are identified. Mild degenerative change is noted at the lower cervical spine. The  visualized musculature is unremarkable in appearance. Review of the MIP images confirms the above findings. IMPRESSION: 1. No evidence of pulmonary embolus. 2. Focal airspace opacity at the left lung base is compatible with pneumonia. 3. Diffuse coronary artery calcifications seen. Electronically Signed   By: Garald Balding M.D.   On: 12/22/2016 20:01    Chart has been reviewed    Assessment/Plan  66 y.o. female with medical history significant of HTN, breast CA, DM, CAD and embolic CVA. At one point  LVEF 20 to 25% improved to LVEF 40 to 45% Echo done on 9/15  Admitted for community-acquired pneumonia and associated elevated troponin likely secondary to demand ischemia  Present on Admission: . HCAP (healthcare-associated pneumonia) given recent hospitalization for CHF or treat as healthcare associated pneumonia initiate cefepime and vancomycin. Oxygen as needed Mucinex and albuterol when necessary  blood in sputum cultures . Elevated troponin most likely secondary to demand ischemia currently no chest pain no EKG changes to suggest ACS continue to cycle cardiac enzymes obtain echogram cardiology consult in a.m. Marland Kitchen CAD -continue home medications including aspirin statins and beta blocker  . Essential hypertension stable continue home medications . Hyperlipidemia with target LDL less than 70 check lipid panel and continue home medications . Ischemic cardiomyopathy - continue home medications including Lasix and Entrsto repeat echogram  Diabetes mellitus with neuropathy- will  continue home medications and order sliding scale  Other plan as per orders.  DVT prophylaxis:   Lovenox     Code Status:  FULL CODE  as per patient    Family Communication:   Family  at  Bedside  plan of care was discussed with   Husband   Disposition Plan:    To home once workup is complete and patient is stable                                                Consults called: email cardiology    Admission  status:  obs given no chest pain or oxygen requirement anticipate quick recovery   Level of care     tele       I have spent a total of 57 min on this admission   Marisa Forbes 12/22/2016, 9:56 PM    [facility]  Pager [phone]   after 2 AM please page floor coverage PA If 7AM-7PM, please contact the day team taking care of the patient  [email or web address]  Password [email or web address]

## 2016-12-22 NOTE — Progress Notes (Signed)
Pharmacy Antibiotic Note  Marisa Forbes is a 66 y.o. female c/o cough admitted on 12/22/2016 with pneumonia.  Pharmacy has been consulted for vancomycin dosing.  Plan: Cefepime 1 Gm IV q8h (MD) Vancomycin 1250 mg x1 ED then 500 mg IV q12h VT=15-20 mg/L F/u Scr/cultures/levels as needed  Height: 5\' 5"  (165.1 cm) Weight: 140 lb (63.5 kg) IBW/kg (Calculated) : 57  Temp (24hrs), Avg:98 F (36.7 C), Min:97.9 F (36.6 C), Max:98.1 F (36.7 C)   Recent Labs Lab 12/22/16 1722  WBC 6.5  CREATININE 0.94    Estimated Creatinine Clearance: 53 mL/min (by C-G formula based on SCr of 0.94 mg/dL).    No Known Allergies  Antimicrobials this admission: 2/23 zosyn >> x1 ED 2/23 vancomycin >>  2/23 cefepime >>  Dose adjustments this admission:   Microbiology results:  BCx:   UCx:    Sputum:    MRSA PCR:   Thank you for allowing pharmacy to be a part of this patient's care.  Dorrene German 12/22/2016 11:13 PM

## 2016-12-23 ENCOUNTER — Observation Stay (HOSPITAL_BASED_OUTPATIENT_CLINIC_OR_DEPARTMENT_OTHER): Payer: Managed Care, Other (non HMO)

## 2016-12-23 DIAGNOSIS — I110 Hypertensive heart disease with heart failure: Secondary | ICD-10-CM | POA: Diagnosis present

## 2016-12-23 DIAGNOSIS — I248 Other forms of acute ischemic heart disease: Secondary | ICD-10-CM

## 2016-12-23 DIAGNOSIS — I5040 Unspecified combined systolic (congestive) and diastolic (congestive) heart failure: Secondary | ICD-10-CM | POA: Diagnosis not present

## 2016-12-23 DIAGNOSIS — Z794 Long term (current) use of insulin: Secondary | ICD-10-CM | POA: Diagnosis not present

## 2016-12-23 DIAGNOSIS — Z7982 Long term (current) use of aspirin: Secondary | ICD-10-CM | POA: Diagnosis not present

## 2016-12-23 DIAGNOSIS — I10 Essential (primary) hypertension: Secondary | ICD-10-CM | POA: Diagnosis not present

## 2016-12-23 DIAGNOSIS — I255 Ischemic cardiomyopathy: Secondary | ICD-10-CM | POA: Diagnosis present

## 2016-12-23 DIAGNOSIS — Z955 Presence of coronary angioplasty implant and graft: Secondary | ICD-10-CM | POA: Diagnosis not present

## 2016-12-23 DIAGNOSIS — I252 Old myocardial infarction: Secondary | ICD-10-CM | POA: Diagnosis not present

## 2016-12-23 DIAGNOSIS — H547 Unspecified visual loss: Secondary | ICD-10-CM | POA: Diagnosis present

## 2016-12-23 DIAGNOSIS — Z923 Personal history of irradiation: Secondary | ICD-10-CM | POA: Diagnosis not present

## 2016-12-23 DIAGNOSIS — I2510 Atherosclerotic heart disease of native coronary artery without angina pectoris: Secondary | ICD-10-CM | POA: Diagnosis not present

## 2016-12-23 DIAGNOSIS — R0602 Shortness of breath: Secondary | ICD-10-CM | POA: Diagnosis present

## 2016-12-23 DIAGNOSIS — I5022 Chronic systolic (congestive) heart failure: Secondary | ICD-10-CM | POA: Diagnosis present

## 2016-12-23 DIAGNOSIS — R748 Abnormal levels of other serum enzymes: Secondary | ICD-10-CM | POA: Diagnosis not present

## 2016-12-23 DIAGNOSIS — E785 Hyperlipidemia, unspecified: Secondary | ICD-10-CM | POA: Diagnosis present

## 2016-12-23 DIAGNOSIS — J189 Pneumonia, unspecified organism: Secondary | ICD-10-CM | POA: Diagnosis not present

## 2016-12-23 DIAGNOSIS — K219 Gastro-esophageal reflux disease without esophagitis: Secondary | ICD-10-CM | POA: Diagnosis present

## 2016-12-23 DIAGNOSIS — Z8673 Personal history of transient ischemic attack (TIA), and cerebral infarction without residual deficits: Secondary | ICD-10-CM | POA: Diagnosis not present

## 2016-12-23 DIAGNOSIS — Z9221 Personal history of antineoplastic chemotherapy: Secondary | ICD-10-CM | POA: Diagnosis not present

## 2016-12-23 DIAGNOSIS — Z853 Personal history of malignant neoplasm of breast: Secondary | ICD-10-CM | POA: Diagnosis not present

## 2016-12-23 DIAGNOSIS — I420 Dilated cardiomyopathy: Secondary | ICD-10-CM | POA: Diagnosis present

## 2016-12-23 DIAGNOSIS — E1140 Type 2 diabetes mellitus with diabetic neuropathy, unspecified: Secondary | ICD-10-CM | POA: Diagnosis present

## 2016-12-23 DIAGNOSIS — J208 Acute bronchitis due to other specified organisms: Secondary | ICD-10-CM | POA: Diagnosis present

## 2016-12-23 DIAGNOSIS — I2583 Coronary atherosclerosis due to lipid rich plaque: Secondary | ICD-10-CM

## 2016-12-23 DIAGNOSIS — E7800 Pure hypercholesterolemia, unspecified: Secondary | ICD-10-CM | POA: Diagnosis present

## 2016-12-23 DIAGNOSIS — Z8249 Family history of ischemic heart disease and other diseases of the circulatory system: Secondary | ICD-10-CM | POA: Diagnosis not present

## 2016-12-23 LAB — COMPREHENSIVE METABOLIC PANEL
ALT: 12 U/L — ABNORMAL LOW (ref 14–54)
AST: 15 U/L (ref 15–41)
Albumin: 3.2 g/dL — ABNORMAL LOW (ref 3.5–5.0)
Alkaline Phosphatase: 55 U/L (ref 38–126)
Anion gap: 9 (ref 5–15)
BUN: 16 mg/dL (ref 6–20)
CO2: 26 mmol/L (ref 22–32)
Calcium: 8.8 mg/dL — ABNORMAL LOW (ref 8.9–10.3)
Chloride: 100 mmol/L — ABNORMAL LOW (ref 101–111)
Creatinine, Ser: 0.64 mg/dL (ref 0.44–1.00)
GFR calc Af Amer: >60 mL/min (ref >60)
GFR calc non Af Amer: >60 mL/min (ref >60)
Glucose, Bld: 261 mg/dL — ABNORMAL HIGH (ref 65–99)
Potassium: 3.5 mmol/L (ref 3.5–5.1)
Sodium: 135 mmol/L (ref 135–145)
Total Bilirubin: 1.3 mg/dL — ABNORMAL HIGH (ref 0.3–1.2)
Total Protein: 5.9 g/dL — ABNORMAL LOW (ref 6.5–8.1)

## 2016-12-23 LAB — GLUCOSE, CAPILLARY
Glucose-Capillary: 256 mg/dL — ABNORMAL HIGH (ref 65–99)
Glucose-Capillary: 223 mg/dL — ABNORMAL HIGH (ref 65–99)
Glucose-Capillary: 347 mg/dL — ABNORMAL HIGH (ref 65–99)
Glucose-Capillary: 315 mg/dL — ABNORMAL HIGH (ref 65–99)

## 2016-12-23 LAB — PHOSPHORUS: Phosphorus: 3.2 mg/dL (ref 2.5–4.6)

## 2016-12-23 LAB — TROPONIN I
Troponin I: 0.7 ng/mL (ref <0.03)
Troponin I: 0.66 ng/mL (ref <0.03)
Troponin I: 0.54 ng/mL (ref <0.03)

## 2016-12-23 LAB — ECHOCARDIOGRAM COMPLETE
Height: 65 in
Weight: 2240 oz

## 2016-12-23 LAB — CBC
HCT: 41.1 % (ref 36.0–46.0)
Hemoglobin: 14.3 g/dL (ref 12.0–15.0)
MCH: 29.4 pg (ref 26.0–34.0)
MCHC: 34.8 g/dL (ref 30.0–36.0)
MCV: 84.4 fL (ref 78.0–100.0)
Platelets: 163 10*3/uL (ref 150–400)
RBC: 4.87 MIL/uL (ref 3.87–5.11)
RDW: 13.3 % (ref 11.5–15.5)
WBC: 4.2 10*3/uL (ref 4.0–10.5)

## 2016-12-23 LAB — MAGNESIUM: Magnesium: 1.9 mg/dL (ref 1.7–2.4)

## 2016-12-23 LAB — PROCALCITONIN: Procalcitonin: <0.1 ng/mL

## 2016-12-23 LAB — TSH: TSH: 1.496 u[IU]/mL (ref 0.350–4.500)

## 2016-12-23 LAB — STREP PNEUMONIAE URINARY ANTIGEN: Strep Pneumo Urinary Antigen: NEGATIVE

## 2016-12-23 LAB — MRSA PCR SCREENING: MRSA by PCR: NEGATIVE

## 2016-12-23 MED ORDER — INSULIN ASPART 100 UNIT/ML ~~LOC~~ SOLN
4.0000 [IU] | Freq: Three times a day (TID) | SUBCUTANEOUS | Status: DC
  Administered 2016-12-23 – 2016-12-24 (×2): 4 [IU] via SUBCUTANEOUS

## 2016-12-23 MED ORDER — PERFLUTREN LIPID MICROSPHERE
1.0000 mL | INTRAVENOUS | Status: AC | PRN
  Administered 2016-12-23: 2 mL via INTRAVENOUS
  Filled 2016-12-23: qty 10

## 2016-12-23 NOTE — Progress Notes (Signed)
CRITICAL VALUE ALERT  Critical value received:  Troponin 0.70  Date of notification:  12/23/2016   Time of notification:  0100  Critical value read back:Yes.    Nurse who received alert: Janett Billow RN   MD notified (1st page):  Dr Reece Levy  Time of first page:  2:22 AM   MD notified (2nd page):  Time of second page:  Responding MD:  Dr Reece Levy  Time MD responded:  2:22 AM

## 2016-12-23 NOTE — Consult Note (Signed)
Primary cardiologist: Dr Dorris Carnes Consulting cardiologist: Dr Carlyle Dolly Requesting physician: Dr Algis Liming Indication: elevated troponin  Clinical Summary Ms. Bibb is a 66 y.o.female history of HTN, breast cancer, DM2, CAD with STEMI in 2013 with DES to LAD and distal LAD PTCA, post cath CVA in 2013, , chronic systolic HF with historically labile LVEFs (as low as 25-30%, most recent echo 07/2015 LVEF 40-45%), admitted with cough and SOB. She denies any chest pain.    K 3.9 Cr 0.94 WBC 6.5 Plt 233 TSH 1.5  Trop 0.62-->0.70-->0.66 CXR no acute process CT PE no PE, focal airspace disease LLL Echo pending EKG  08/2012 cath: LM patent, LAD patent stent, distal LAD 40-70%, D1 70-80%, D2 diffuse disease, LCX patent, RCA 50% mid and 70% distal PDA.  EKG SR, LAFB, anteroseptal Qwaves. Chronic changes   No Known Allergies  Medications Scheduled Medications: . aspirin EC  81 mg Oral Daily  . atorvastatin  80 mg Oral Q supper  . carvedilol  25 mg Oral BID WC  . ceFEPime (MAXIPIME) IV  1 g Intravenous Q8H  . enoxaparin (LOVENOX) injection  40 mg Subcutaneous QHS  . ezetimibe  10 mg Oral Daily  . furosemide  40 mg Oral Daily  . guaiFENesin  600 mg Oral BID  . insulin aspart  0-15 Units Subcutaneous TID WC  . insulin aspart  0-5 Units Subcutaneous QHS  . insulin glargine  18 Units Subcutaneous Q2200  . isosorbide mononitrate  60 mg Oral Daily  . sacubitril-valsartan  1 tablet Oral BID  . sodium chloride flush  3 mL Intravenous Q12H  . sodium chloride flush  3 mL Intravenous Q12H  . spironolactone  25 mg Oral Daily  . vancomycin  500 mg Intravenous Q12H     Infusions:   PRN Medications:  sodium chloride, acetaminophen **OR** acetaminophen, albuterol, ondansetron **OR** ondansetron (ZOFRAN) IV, sodium chloride flush   Past Medical History:  Diagnosis Date  . Arthritis    back  . Balance problems    due to stroke  . Breast cancer ([place]) 12/03/12   a. Triple  negative, dx 2014. S/p L lumpectomy; sentinel node bx negative. S/p chemo with CMF and radiation.  . CHF (congestive heart failure) ([place])    On Monday she said her cardiologist was concerned for possible CHF, but is now improving-per pt.(07/26/15)  . Coronary artery disease    a. STEMI 08/2012: s/p DES to LAD, PTCA to downstream LAD. b. Relook cath same hospitalization: stable post-PCI anatomy with Stable Diag lesions (unlikely cause of resting angina, not good PCI targets), stable RCA lesion (non flow limiting).  . Diabetes mellitus without complication ([place])    Takes insulin and metformin  . GERD (gastroesophageal reflux disease)    food related  . History of cancer chemotherapy    last in August 2014  . History of radiation therapy 06/23/2013-08/08/2013   62.4 gray to left breast  . HTN (hypertension)   . Hypercholesteremia   . LV dysfunction    a. EF 45-50% at time of STEMI 08/2012. b. Echo 04/2013: EF 45-50%, mild focal basal hypertrophy of septum, grade 1 d/d.  Marland Kitchen Myocardial infarction 09/07/12  . Peripheral vision loss    left  . Renal artery stenosis ([facility])    a. 20% L RAS in 08/2012 by cath. b. Duplex 07/2012: >60% L RAS.  . Stroke, embolic ([place])    a. [id number] post cath.  . Vasovagal syncope    a. 04/2013 -  echo stable compared to prior EF 45-50%, negative carotid duplex.    Past Surgical History:  Procedure Laterality Date  . BACK SURGERY     x3, lower back  . BREAST LUMPECTOMY WITH NEEDLE LOCALIZATION AND AXILLARY SENTINEL LYMPH NODE BX Left 01/08/2013   Procedure: LEFT BREAST WIRE GUIDED  LUMPECTOMY AND LEFT AXILLARY SENTINEL  NODE BX;  Surgeon: Rolm Bookbinder, MD;  Location: [place];  Service: General;  Laterality: Left;  . CATARACT EXTRACTION Bilateral   . CORONARY ANGIOPLASTY WITH STENT PLACEMENT    . LEFT HEART CATHETERIZATION WITH CORONARY ANGIOGRAM N/A 09/07/2012   Procedure: LEFT HEART CATHETERIZATION WITH CORONARY ANGIOGRAM;  Surgeon: Troy Sine, MD;  Location: [facility]  CATH LAB;  Service: Cardiovascular;  Laterality: N/A;  . LEFT HEART CATHETERIZATION WITH CORONARY ANGIOGRAM  09/09/2012   Procedure: LEFT HEART CATHETERIZATION WITH CORONARY ANGIOGRAM;  Surgeon: Leonie Man, MD;  Location: [facility] CATH LAB;  Service: Cardiovascular;;  . PERCUTANEOUS CORONARY STENT INTERVENTION (PCI-S) N/A 09/07/2012   Procedure: PERCUTANEOUS CORONARY STENT INTERVENTION (PCI-S);  Surgeon: Troy Sine, MD;  Location: [facility] CATH LAB;  Service: Cardiovascular;  Laterality: N/A;  . PORT-A-CATH REMOVAL N/A 10/14/2013   Procedure: REMOVAL PORT-A-CATH;  Surgeon: Rolm Bookbinder, MD;  Location: [facility] ORS;  Service: General;  Laterality: N/A;  . PORTACATH PLACEMENT Right 02/17/2013   Procedure: INSERTION PORT-A-CATH;  Surgeon: Rolm Bookbinder, MD;  Location: [place];  Service: General;  Laterality: Right;    Family History  Problem Relation Age of Onset  . Heart disease Father   . Hypertension Father   . Cancer Neg Hx   . Diabetes Neg Hx   . Hyperlipidemia Neg Hx   . Kidney disease Neg Hx   . Stroke Neg Hx     Social History Ms. Labrador reports that she has never smoked. She has never used smokeless tobacco. Ms. Kulinski reports that she drinks alcohol.  Review of Systems CONSTITUTIONAL: No weight loss, fever, chills, weakness or fatigue.  HEENT: Eyes: No visual loss, blurred vision, double vision or yellow sclerae. No hearing loss, sneezing, congestion, runny nose or sore throat.  SKIN: No rash or itching.  CARDIOVASCULAR: No chest pain, chest pressure or chest discomfort. No palpitations or edema.  RESPIRATORY: per HPI GASTROINTESTINAL: No anorexia, nausea, vomiting or diarrhea. No abdominal pain or blood.  GENITOURINARY: no polyuria, no dysuria NEUROLOGICAL: No headache, dizziness, syncope, paralysis, ataxia, numbness or tingling in the extremities. No change in bowel or bladder control.  MUSCULOSKELETAL: No muscle, back pain, joint pain or stiffness.  HEMATOLOGIC: No anemia,  bleeding or bruising.  LYMPHATICS: No enlarged nodes. No history of splenectomy.  PSYCHIATRIC: No history of depression or anxiety.      Physical Examination Blood pressure (!) 141/70, pulse 67, temperature 97.7 F (36.5 C), temperature source Oral, resp. rate 18, height 5\' 5"  (1.651 m), weight 140 lb (63.5 kg), SpO2 96 %.  Intake/Output Summary (Last 24 hours) at 12/23/16 0842 Last data filed at 12/23/16 0500  Gross per 24 hour  Intake              490 ml  Output              500 ml  Net              -10 ml    HEENT: sclera clear, throat clear  Cardiovascular: RRR, no m/r/g, no jvd  Respiratory: CTAB  GI: abdomen soft, NT, ND  MSK: no LE edema  Neuro: no  focal deficits  Psych: appropriate affect   Lab Results  Basic Metabolic Panel:  Recent Labs Lab 12/22/16 1722 12/23/16 0515  NA 133* 135  K 3.9 3.5  CL 93* 100*  CO2 27 26  GLUCOSE 370* 261*  BUN 20 16  CREATININE 0.94 0.64  CALCIUM 10.2 8.8*  MG  --  1.9  PHOS  --  3.2    Liver Function Tests:  Recent Labs Lab 12/23/16 0515  AST 15  ALT 12*  ALKPHOS 55  BILITOT 1.3*  PROT 5.9*  ALBUMIN 3.2*    CBC:  Recent Labs Lab 12/22/16 1722 12/23/16 0515  WBC 6.5 4.2  HGB 15.6* 14.3  HCT 44.2 41.1  MCV 84.2 84.4  PLT 233 163    Cardiac Enzymes:  Recent Labs Lab 12/22/16 2334 12/23/16 0515  TROPONINI 0.70* 0.66*    BNP: Invalid input(s): POCBNP   ECG   Imaging   08/2012 cath Hemodynamics:  Central Aortic / Mean Pressures: 120/70 mmHg; 92 mmHg  Left Ventricular Pressures / EDP: 118/5 mmHg; 13 mmHg  Left Ventriculography: Not done  Coronary Anatomy: See drawing by Dr. Claiborne Billings post PCI, scanned.  Left Main: Moderate caliber -> bifrucates to LAD & Circumflex LAD: Moderate caliber vessel with proximal Diag followed by patent stent.  The LAD beyond the stent tapers to a <10mm vessel with diffuse lesions 40-70% throughout, most notable at tiny D2 takeoff where there appears  to be a small "shelf-like" lesion with what may be a small dissection flap.  TIMI 3 flow.  D1: Small caliber vessel with proximal & mid ~70-80% lesions in a ~1.5-2.0 mm vessel  D2: tiny vessel with diffuse disease Left Circumflex: Moderate to large caliber vessel that terminates as a bifurcating lateral OM, but has several small OMBs in the mid vessel. Only minimal luminal irregularities  RCA: Similar in appearance to STEMI angiography.  ~50% mid & ~70% distal pre PDA.  PDA & extensive RPL system with minimal luminal irregularites    Impression/Recommendations 1. CAD - peak troponin of 0.7 in setting of pneumonia, EKG without ischemic changes. Echo is pending - no chest pain symptoms. - at this time suspect demand ischemia. From her cath in 2013 she had small/distal vessel disease as described above that likely in the setting of increased demand has become ischemic - continue medical therapy with ASA, atorva, coreg, imdur - consider inpatient vs outpatient noninvasive stress testing, at this time likely outpatient.   2. Chronic systolic HF - repeat echo pending - continue medical therapt with coreg, entresto, aldactone.  Carlyle Dolly, M.D.

## 2016-12-23 NOTE — Progress Notes (Signed)
Paged DR Delford Field about the Pt's Troponin 0.70, no orders given at this time, Pt is alert x4 resting n/o chest pain, I will continue to monitor.

## 2016-12-23 NOTE — Discharge Summary (Deleted)
PROGRESS NOTE  Marisa Forbes  [id number] DOB: April 03, 1951  DOA: 12/22/2016 PCP: Scarlette Calico, MD   Brief Narrative:  66 year old female, PMH of HTN, DM 2, CAD with STEMI 2013 status post DES to LAD and distal LAD PTCA, post CVA 0000000, chronic systolic CHF (LVEF 123XX123 percent by most recent echo 07/2015), breast cancer, GERD, presented to ED after she noted chest congestion while at work where she was able to hear her own rattling breath sounds, mild intermittent dry cough and dyspnea without chest pain, fever or chills. In the ED, chest x-ray negative for pneumonia but CTA chest showed focal airspace opacity in left lung base compatible with pneumonia and no PE. Elevated troponin secondary to demand ischemia. Cardiology was consulted.   Assessment & Plan:   Active Problems:   Essential hypertension   Hyperlipidemia with target LDL less than 70   CAD - STEMI s/p urgent LAD DES 09/07/12, residual RCA and Dx disease, EF 45-50%   Congestive dilated cardiomyopathy (HCC)   DM (diabetes mellitus), type 2 (HCC)   Ischemic cardiomyopathy   Elevated troponin   HCAP (healthcare-associated pneumonia)   Healthcare acquired pneumonia, LLL - Reported symptoms of chest congestion, mild cough and dyspnea. Initial chest x-ray negative for pneumonia but CTA chest showed focal airspace opacity in left lung base compatible with pneumonia and no PE. No fever or leukocytosis. - Last hospitalization 07/29/15-07/30/15 - Empirically treating with IV vancomycin and cefepime. - Check MRSA screen (if negative consider stopping vancomycin) and pro-calcitonin. Blood cultures 2: Negative to date. - Recommend follow-up imaging (? Repeat CT noncontrasted) in 4 weeks to ensure resolution of pneumonia findings.  Elevated troponin/demand ischemia in patient with CAD - Cardiology consultation appreciated. Agree that this is demand ischemia. Continue aspirin, statins, beta blockers, nitrates.  Essential  hypertension Controlled  Hyperlipidemia Continue atorvastatin and Zetia.  Type II DM with neuropathy Mildly uncontrolled in the hospital. CBGs ranging in the 200s. Continue Lantus 18 units at bedtime and SSI. May need further adjustment.  Chronic systolic CHF/ischemic cardiomyopathy Compensated. Continue furosemide, nitrites, Entresto and Aldactone. Follow repeat echo results.   DVT prophylaxis: Lovenox Code Status: Full Family Communication: None at bedside Disposition Plan: DC home possibly in the next 24-48 hours   Consultants:   Cardiology  Procedures:   None  Antimicrobials:   IV vancomycin and cefepime.    Subjective: Feels better. Chest congestion has improved. Denies chest pain or cough.  Objective:  Vitals:   12/22/16 2044 12/22/16 2304 12/23/16 0601 12/23/16 0949  BP:  135/81 (!) 141/70 113/73  Pulse:  80 67 83  Resp:  16 18   Temp:  97.9 F (36.6 C) 97.7 F (36.5 C)   TempSrc:  Oral Oral   SpO2:  99% 96%   Weight: 63.5 kg (140 lb)     Height: 5\' 5"  (1.651 m)       Intake/Output Summary (Last 24 hours) at 12/23/16 1255 Last data filed at 12/23/16 1012  Gross per 24 hour  Intake              730 ml  Output             1100 ml  Net             -370 ml   Filed Weights   12/22/16 2035 12/22/16 2044  Weight: 63.5 kg (140 lb) 63.5 kg (140 lb)    Examination:  General exam: Pleasant middle-aged female seen ambulating comfortably in the room  without distress. Respiratory system: Clear to auscultation. Respiratory effort normal. Cardiovascular system: S1 & S2 heard, RRR. No JVD, murmurs, rubs, gallops or clicks. No pedal edema. Telemetry: Sinus rhythm. Gastrointestinal system: Abdomen is nondistended, soft and nontender. No organomegaly or masses felt. Normal bowel sounds heard. Central nervous system: Alert and oriented. No focal neurological deficits. Extremities: Symmetric 5 x 5 power. Skin: No rashes, lesions or ulcers Psychiatry: Judgement  and insight appear normal. Mood & affect appropriate.     Data Reviewed: I have personally reviewed following labs and imaging studies  CBC:  Recent Labs Lab 12/22/16 1722 12/23/16 0515  WBC 6.5 4.2  HGB 15.6* 14.3  HCT 44.2 41.1  MCV 84.2 84.4  PLT 233 [id number]   Basic Metabolic Panel:  Recent Labs Lab 12/22/16 1722 12/23/16 0515  NA 133* 135  K 3.9 3.5  CL 93* 100*  CO2 27 26  GLUCOSE 370* 261*  BUN 20 16  CREATININE 0.94 0.64  CALCIUM 10.2 8.8*  MG  --  1.9  PHOS  --  3.2   GFR: Estimated Creatinine Clearance: 62.2 mL/min (by C-G formula based on SCr of 0.64 mg/dL). Liver Function Tests:  Recent Labs Lab 12/23/16 0515  AST 15  ALT 12*  ALKPHOS 55  BILITOT 1.3*  PROT 5.9*  ALBUMIN 3.2*   No results for input(s): LIPASE, AMYLASE in the last 168 hours. No results for input(s): AMMONIA in the last 168 hours. Coagulation Profile: No results for input(s): INR, PROTIME in the last 168 hours. Cardiac Enzymes:  Recent Labs Lab 12/22/16 2334 12/23/16 0515 12/23/16 1121  TROPONINI 0.70* 0.66* 0.54*   BNP (last 3 results) No results for input(s): PROBNP in the last 8760 hours. HbA1C: No results for input(s): HGBA1C in the last 72 hours. CBG:  Recent Labs Lab 12/22/16 2311 12/23/16 0744 12/23/16 1146  GLUCAP 292* 256* 223*   Lipid Profile: No results for input(s): CHOL, HDL, LDLCALC, TRIG, CHOLHDL, LDLDIRECT in the last 72 hours. Thyroid Function Tests:  Recent Labs  12/23/16 0515  TSH 1.496   Anemia Panel: No results for input(s): VITAMINB12, FOLATE, FERRITIN, TIBC, IRON, RETICCTPCT in the last 72 hours.  Sepsis Labs:  Recent Labs Lab 12/22/16 1722 12/23/16 0515  WBC 6.5 4.2    Recent Results (from the past 240 hour(s))  Blood culture (routine x 2)     Status: None (Preliminary result)   Collection Time: 12/22/16  8:30 PM  Result Value Ref Range Status   Specimen Description BLOOD RIGHT HAND  Final   Special Requests BOTTLES DRAWN  AEROBIC AND ANAEROBIC 5CC EACH  Final   Culture   Final    NO GROWTH < 12 HOURS Performed at [facility], [place] [address], [place], [place] [address]    Report Status PENDING  Incomplete  Blood culture (routine x 2)     Status: None (Preliminary result)   Collection Time: 12/22/16  8:55 PM  Result Value Ref Range Status   Specimen Description BLOOD RIGHT FOREARM  Final   Special Requests BOTTLES DRAWN AEROBIC AND ANAEROBIC 5CC EACH  Final   Culture   Final    NO GROWTH < 12 HOURS Performed at [facility], [place] [address] [place], [place] [address]    Report Status PENDING  Incomplete         Radiology Studies: Dg Chest 2 View  Result Date: 12/22/2016 CLINICAL DATA:  Chest congestion today. Personal history of congestive heart failure and breast  cancer. EXAM: CHEST  2 VIEW COMPARISON:  None. FINDINGS: The heart size and mediastinal contours are within normal limits. Both lungs are clear. The visualized skeletal structures are unremarkable. IMPRESSION: Negative two view chest x-ray Electronically Signed   By: San Morelle M.D.   On: 12/22/2016 17:28   Ct Angio Chest Pe W And/or Wo Contrast  Result Date: 12/22/2016 CLINICAL DATA:  Acute onset of generalized chest congestion and shortness of breath. Initial encounter. EXAM: CT ANGIOGRAPHY CHEST WITH CONTRAST TECHNIQUE: Multidetector CT imaging of the chest was performed using the standard protocol during bolus administration of intravenous contrast. Multiplanar CT image reconstructions and MIPs were obtained to evaluate the vascular anatomy. CONTRAST:  100 mL of Isovue 370 IV contrast COMPARISON:  Chest radiograph performed earlier today at 5:20 p.m. FINDINGS: Cardiovascular:  There is no evidence of pulmonary embolus. Diffuse coronary artery calcifications are seen. The heart remains normal in size. Scattered calcification noted along the aortic arch and descending thoracic aorta. The great vessels are grossly  unremarkable in appearance. Mediastinum/Nodes: The mediastinum is unremarkable in appearance. No mediastinal lymphadenopathy is seen. No pericardial effusion is identified. The thyroid gland is unremarkable. No axillary lymphadenopathy is seen. Lungs/Pleura: Focal airspace opacity at the left lung base is compatible with pneumonia. No pleural effusion or pneumothorax is seen. No masses are identified. Upper Abdomen: The visualized portions of the liver and spleen are grossly unremarkable. Musculoskeletal: No acute osseous abnormalities are identified. Mild degenerative change is noted at the lower cervical spine. The visualized musculature is unremarkable in appearance. Review of the MIP images confirms the above findings. IMPRESSION: 1. No evidence of pulmonary embolus. 2. Focal airspace opacity at the left lung base is compatible with pneumonia. 3. Diffuse coronary artery calcifications seen. Electronically Signed   By: Garald Balding M.D.   On: 12/22/2016 20:01        Scheduled Meds: . aspirin EC  81 mg Oral Daily  . atorvastatin  80 mg Oral Q supper  . carvedilol  25 mg Oral BID WC  . ceFEPime (MAXIPIME) IV  1 g Intravenous Q8H  . enoxaparin (LOVENOX) injection  40 mg Subcutaneous QHS  . ezetimibe  10 mg Oral Daily  . furosemide  40 mg Oral Daily  . guaiFENesin  600 mg Oral BID  . insulin aspart  0-15 Units Subcutaneous TID WC  . insulin aspart  0-5 Units Subcutaneous QHS  . insulin glargine  18 Units Subcutaneous Q2200  . isosorbide mononitrate  60 mg Oral Daily  . sacubitril-valsartan  1 tablet Oral BID  . sodium chloride flush  3 mL Intravenous Q12H  . sodium chloride flush  3 mL Intravenous Q12H  . spironolactone  25 mg Oral Daily  . vancomycin  500 mg Intravenous Q12H   Continuous Infusions:   LOS: 0 days     [facility], MD [facility] Pager [phone]  If 7PM-7AM, please contact night-coverage [email or web address] Password [email or web address] 12/23/2016, 12:55 PM

## 2016-12-23 NOTE — Progress Notes (Signed)
  Echocardiogram 2D Echocardiogram has been performed.  Donata Clay 12/23/2016, 2:27 PM

## 2016-12-24 ENCOUNTER — Other Ambulatory Visit: Payer: Self-pay | Admitting: Cardiology

## 2016-12-24 DIAGNOSIS — J208 Acute bronchitis due to other specified organisms: Principal | ICD-10-CM

## 2016-12-24 DIAGNOSIS — R079 Chest pain, unspecified: Secondary | ICD-10-CM

## 2016-12-24 LAB — GLUCOSE, CAPILLARY
Glucose-Capillary: 290 mg/dL — ABNORMAL HIGH (ref 65–99)
Glucose-Capillary: 300 mg/dL — ABNORMAL HIGH (ref 65–99)

## 2016-12-24 MED ORDER — GUAIFENESIN-DM 100-10 MG/5ML PO SYRP: 5.0000 mL | ORAL_SOLUTION | ORAL | 0 refills | Status: DC | PRN

## 2016-12-24 NOTE — Progress Notes (Signed)
PROGRESS NOTE  Marisa Forbes  [id number] DOB: [birth year]  DOA: 12/22/2016 PCP: Scarlette Calico, MD   Had inadvertently labeled 12/23/16 progress note as discharge summary and this was rectified.  Brief Narrative:  66 year old female, PMH of HTN, DM 2, CAD with STEMI 2013 status post DES to LAD and distal LAD PTCA, post CVA 0000000, chronic systolic CHF (LVEF [id number] percent by most recent echo 07/2015), breast cancer, GERD, presented to ED after she noted chest congestion while at work where she was able to hear her own rattling breath sounds, mild intermittent dry cough and dyspnea without chest pain, fever or chills. In the ED, chest x-ray negative for pneumonia but CTA chest showed focal airspace opacity in left lung base compatible with pneumonia and no PE. Elevated troponin secondary to demand ischemia. Cardiology was consulted.   Assessment & Plan:   Active Problems:   Essential hypertension   Hyperlipidemia with target LDL less than 70   CAD - STEMI s/p urgent LAD DES 09/07/12, residual RCA and Dx disease, EF 45-50%   Congestive dilated cardiomyopathy ([place])   DM (diabetes mellitus), type 2 (HCC)   Ischemic cardiomyopathy   Elevated troponin   CAP (community acquired pneumonia)   HCAP (healthcare-associated pneumonia)   Healthcare acquired pneumonia, LLL - Reported symptoms of chest congestion, mild cough and dyspnea. Initial chest x-ray negative for pneumonia but CTA chest showed focal airspace opacity in left lung base compatible with pneumonia and no PE. No fever or leukocytosis. - Last hospitalization 07/29/15-07/30/15 - Empirically treating with IV vancomycin and cefepime. - Check MRSA screen (if negative consider stopping vancomycin) and pro-calcitonin. Blood cultures 2: Negative to date. - Recommend follow-up imaging (? Repeat CT noncontrasted) in 4 weeks to ensure resolution of pneumonia findings.  Elevated troponin/demand ischemia in patient with CAD - Cardiology  consultation appreciated. Agree that this is demand ischemia. Continue aspirin, statins, beta blockers, nitrates.  Essential hypertension Controlled  Hyperlipidemia Continue atorvastatin and Zetia.  Type II DM with neuropathy Mildly uncontrolled in the hospital. CBGs ranging in the 200s. Continue Lantus 18 units at bedtime and SSI. May need further adjustment.  Chronic systolic CHF/ischemic cardiomyopathy Compensated. Continue furosemide, nitrites, Entresto and Aldactone. Follow repeat echo results.   DVT prophylaxis: Lovenox Code Status: Full Family Communication: None at bedside Disposition Plan: DC home possibly in the next 24-48 hours   Consultants:   Cardiology  Procedures:   None  Antimicrobials:   IV vancomycin and cefepime.    Subjective: Feels better. Chest congestion has improved. Denies chest pain or cough.  Objective:  Vitals:   12/23/16 2127 12/24/16 0338 12/24/16 0430 12/24/16 0434  BP: (!) 109/56 (!) 160/95  136/88  Pulse: 87 73  75  Resp: 18 18    Temp: 98.5 F (36.9 C) 97.8 F (36.6 C)    TempSrc: Oral Oral    SpO2: 95% 98%    Weight:   65.2 kg (143 lb 11.2 oz)   Height:        Intake/Output Summary (Last 24 hours) at 12/24/16 1320 Last data filed at 12/24/16 1000  Gross per 24 hour  Intake             1360 ml  Output             1601 ml  Net             -241 ml   Filed Weights   12/22/16 2035 12/22/16 2044 12/24/16 0430  Weight: 63.5 kg (140  lb) 63.5 kg (140 lb) 65.2 kg (143 lb 11.2 oz)    Examination:  General exam: Pleasant middle-aged female seen ambulating comfortably in the room without distress. Respiratory system: Clear to auscultation. Respiratory effort normal. Cardiovascular system: S1 & S2 heard, RRR. No JVD, murmurs, rubs, gallops or clicks. No pedal edema. Telemetry: Sinus rhythm. Gastrointestinal system: Abdomen is nondistended, soft and nontender. No organomegaly or masses felt. Normal bowel sounds heard. Central  nervous system: Alert and oriented. No focal neurological deficits. Extremities: Symmetric 5 x 5 power. Skin: No rashes, lesions or ulcers Psychiatry: Judgement and insight appear normal. Mood & affect appropriate.     Data Reviewed: I have personally reviewed following labs and imaging studies  CBC:  Recent Labs Lab 12/22/16 1722 12/23/16 0515  WBC 6.5 4.2  HGB 15.6* 14.3  HCT 44.2 41.1  MCV 84.2 84.4  PLT 233 [id number]   Basic Metabolic Panel:  Recent Labs Lab 12/22/16 1722 12/23/16 0515  NA 133* 135  K 3.9 3.5  CL 93* 100*  CO2 27 26  GLUCOSE 370* 261*  BUN 20 16  CREATININE 0.94 0.64  CALCIUM 10.2 8.8*  MG  --  1.9  PHOS  --  3.2   GFR: Estimated Creatinine Clearance: 62.2 mL/min (by C-G formula based on SCr of 0.64 mg/dL). Liver Function Tests:  Recent Labs Lab 12/23/16 0515  AST 15  ALT 12*  ALKPHOS 55  BILITOT 1.3*  PROT 5.9*  ALBUMIN 3.2*   No results for input(s): LIPASE, AMYLASE in the last 168 hours. No results for input(s): AMMONIA in the last 168 hours. Coagulation Profile: No results for input(s): INR, PROTIME in the last 168 hours. Cardiac Enzymes:  Recent Labs Lab 12/22/16 2334 12/23/16 0515 12/23/16 1121  TROPONINI 0.70* 0.66* 0.54*   BNP (last 3 results) No results for input(s): PROBNP in the last 8760 hours. HbA1C: No results for input(s): HGBA1C in the last 72 hours. CBG:  Recent Labs Lab 12/23/16 1146 12/23/16 1659 12/23/16 2126 12/24/16 0742 12/24/16 1200  GLUCAP 223* 347* 315* 290* 300*   Lipid Profile: No results for input(s): CHOL, HDL, LDLCALC, TRIG, CHOLHDL, LDLDIRECT in the last 72 hours. Thyroid Function Tests:  Recent Labs  12/23/16 0515  TSH 1.496   Anemia Panel: No results for input(s): VITAMINB12, FOLATE, FERRITIN, TIBC, IRON, RETICCTPCT in the last 72 hours.  Sepsis Labs:  Recent Labs Lab 12/22/16 1722 12/23/16 0515 12/23/16 1119  PROCALCITON  --   --  <0.10  WBC 6.5 4.2  --     Recent  Results (from the past 240 hour(s))  Blood culture (routine x 2)     Status: None (Preliminary result)   Collection Time: 12/22/16  8:30 PM  Result Value Ref Range Status   Specimen Description BLOOD RIGHT HAND  Final   Special Requests BOTTLES DRAWN AEROBIC AND ANAEROBIC 5CC EACH  Final   Culture   Final    NO GROWTH < 12 HOURS Performed at [facility], [place] [address] [place], [place] [address]    Report Status PENDING  Incomplete  Blood culture (routine x 2)     Status: None (Preliminary result)   Collection Time: 12/22/16  8:55 PM  Result Value Ref Range Status   Specimen Description BLOOD RIGHT FOREARM  Final   Special Requests BOTTLES DRAWN AEROBIC AND ANAEROBIC 5CC EACH  Final   Culture   Final    NO GROWTH < 12 HOURS Performed at [facility],  [address] [place], [place] [address]    Report Status PENDING  Incomplete  MRSA PCR Screening     Status: None   Collection Time: 12/23/16  4:00 PM  Result Value Ref Range Status   MRSA by PCR NEGATIVE NEGATIVE Final    Comment:        The GeneXpert MRSA Assay (FDA approved for NASAL specimens only), is one component of a comprehensive MRSA colonization surveillance program. It is not intended to diagnose MRSA infection nor to guide or monitor treatment for MRSA infections.          Radiology Studies: Dg Chest 2 View  Result Date: 12/22/2016 CLINICAL DATA:  Chest congestion today. Personal history of congestive heart failure and breast cancer. EXAM: CHEST  2 VIEW COMPARISON:  None. FINDINGS: The heart size and mediastinal contours are within normal limits. Both lungs are clear. The visualized skeletal structures are unremarkable. IMPRESSION: Negative two view chest x-ray Electronically Signed   By: San Morelle M.D.   On: 12/22/2016 17:28   Ct Angio Chest Pe W And/or Wo Contrast  Result Date: 12/22/2016 CLINICAL DATA:  Acute onset of generalized chest congestion and shortness of breath. Initial  encounter. EXAM: CT ANGIOGRAPHY CHEST WITH CONTRAST TECHNIQUE: Multidetector CT imaging of the chest was performed using the standard protocol during bolus administration of intravenous contrast. Multiplanar CT image reconstructions and MIPs were obtained to evaluate the vascular anatomy. CONTRAST:  100 mL of Isovue 370 IV contrast COMPARISON:  Chest radiograph performed earlier today at 5:20 p.m. FINDINGS: Cardiovascular:  There is no evidence of pulmonary embolus. Diffuse coronary artery calcifications are seen. The heart remains normal in size. Scattered calcification noted along the aortic arch and descending thoracic aorta. The great vessels are grossly unremarkable in appearance. Mediastinum/Nodes: The mediastinum is unremarkable in appearance. No mediastinal lymphadenopathy is seen. No pericardial effusion is identified. The thyroid gland is unremarkable. No axillary lymphadenopathy is seen. Lungs/Pleura: Focal airspace opacity at the left lung base is compatible with pneumonia. No pleural effusion or pneumothorax is seen. No masses are identified. Upper Abdomen: The visualized portions of the liver and spleen are grossly unremarkable. Musculoskeletal: No acute osseous abnormalities are identified. Mild degenerative change is noted at the lower cervical spine. The visualized musculature is unremarkable in appearance. Review of the MIP images confirms the above findings. IMPRESSION: 1. No evidence of pulmonary embolus. 2. Focal airspace opacity at the left lung base is compatible with pneumonia. 3. Diffuse coronary artery calcifications seen. Electronically Signed   By: Garald Balding M.D.   On: 12/22/2016 20:01        Scheduled Meds: . aspirin EC  81 mg Oral Daily  . atorvastatin  80 mg Oral Q supper  . carvedilol  25 mg Oral BID WC  . ceFEPime (MAXIPIME) IV  1 g Intravenous Q8H  . enoxaparin (LOVENOX) injection  40 mg Subcutaneous QHS  . ezetimibe  10 mg Oral Daily  . furosemide  40 mg Oral  Daily  . guaiFENesin  600 mg Oral BID  . insulin aspart  0-15 Units Subcutaneous TID WC  . insulin aspart  0-5 Units Subcutaneous QHS  . insulin aspart  4 Units Subcutaneous TID WC  . insulin glargine  18 Units Subcutaneous Q2200  . isosorbide mononitrate  60 mg Oral Daily  . sacubitril-valsartan  1 tablet Oral BID  . sodium chloride flush  3 mL Intravenous Q12H  . sodium chloride flush  3 mL Intravenous Q12H  .  spironolactone  25 mg Oral Daily  . vancomycin  500 mg Intravenous Q12H   Continuous Infusions:   LOS: 1 day     [facility], MD [facility] Pager [phone]  If 7PM-7AM, please contact night-coverage [email or web address] Password [email or web address] 12/24/2016, 1:20 PM

## 2016-12-24 NOTE — Progress Notes (Signed)
Subjective:    Some coughing overnight but otherwise no complaints  Objective:   Temp:  [97.8 F (36.6 C)-98.5 F (36.9 C)] 97.8 F (36.6 C) (02/25 0338) Pulse Rate:  [73-87] 75 (02/25 0434) Resp:  [16-18] 18 (02/25 0338) BP: (102-160)/(50-95) 136/88 (02/25 0434) SpO2:  [95 %-98 %] 98 % (02/25 0338) Weight:  [143 lb 11.2 oz (65.2 kg)] 143 lb 11.2 oz (65.2 kg) (02/25 0430) Last BM Date: 12/22/16  Filed Weights   12/22/16 2035 12/22/16 2044 12/24/16 0430  Weight: 140 lb (63.5 kg) 140 lb (63.5 kg) 143 lb 11.2 oz (65.2 kg)    Intake/Output Summary (Last 24 hours) at 12/24/16 0816 Last data filed at 12/24/16 0600  Gross per 24 hour  Intake             1360 ml  Output             2201 ml  Net             -841 ml    Telemetry: NSR  Exam:  General: NAD  HEENT: sclera clear, throat clear  Resp: CTAB  Cardiac: RRR, no m/r/g, no jvd  GI: abvdomen soft, NT, ND  MSK: nO LE edema,   Neuro: no focal deficits  Psych: appropriate affect  Lab Results:  Basic Metabolic Panel:  Recent Labs Lab 12/22/16 1722 12/23/16 0515  NA 133* 135  K 3.9 3.5  CL 93* 100*  CO2 27 26  GLUCOSE 370* 261*  BUN 20 16  CREATININE 0.94 0.64  CALCIUM 10.2 8.8*  MG  --  1.9    Liver Function Tests:  Recent Labs Lab 12/23/16 0515  AST 15  ALT 12*  ALKPHOS 55  BILITOT 1.3*  PROT 5.9*  ALBUMIN 3.2*    CBC:  Recent Labs Lab 12/22/16 1722 12/23/16 0515  WBC 6.5 4.2  HGB 15.6* 14.3  HCT 44.2 41.1  MCV 84.2 84.4  PLT 233 163    Cardiac Enzymes:  Recent Labs Lab 12/22/16 2334 12/23/16 0515 12/23/16 1121  TROPONINI 0.70* 0.66* 0.54*    BNP: No results for input(s): PROBNP in the last 8760 hours.  Coagulation: No results for input(s): INR in the last 168 hours.  ECG:   Medications:   Scheduled Medications: . aspirin EC  81 mg Oral Daily  . atorvastatin  80 mg Oral Q supper  . carvedilol  25 mg Oral BID WC  . ceFEPime (MAXIPIME) IV  1 g  Intravenous Q8H  . enoxaparin (LOVENOX) injection  40 mg Subcutaneous QHS  . ezetimibe  10 mg Oral Daily  . furosemide  40 mg Oral Daily  . guaiFENesin  600 mg Oral BID  . insulin aspart  0-15 Units Subcutaneous TID WC  . insulin aspart  0-5 Units Subcutaneous QHS  . insulin aspart  4 Units Subcutaneous TID WC  . insulin glargine  18 Units Subcutaneous Q2200  . isosorbide mononitrate  60 mg Oral Daily  . sacubitril-valsartan  1 tablet Oral BID  . sodium chloride flush  3 mL Intravenous Q12H  . sodium chloride flush  3 mL Intravenous Q12H  . spironolactone  25 mg Oral Daily  . vancomycin  500 mg Intravenous Q12H     Infusions:   PRN Medications:  sodium chloride, acetaminophen **OR** acetaminophen, albuterol, ondansetron **OR** ondansetron (ZOFRAN) IV, sodium chloride flush     Assessment/Plan    1. CAD - peak troponin of 0.7 in setting of pneumonia  that quickly trended down, EKG without ischemic changes. Echo LVEF 40-45% hypokinesis of the anteroseptal and apical myocardium, grade I diastolic dysfunction. Overall stable echo findings from 07/2015 study.  - no chest pain symptoms, presented with cough and SOB.  - at this time suspect demand ischemia. From her cath in 2013 she had small/distal vessel disease as described above that likely in the setting of increased demand has become ischemic - continue medical therapy with ASA, atorva, coreg, imdur - we will arrange outpatient nuclear stress test once pneumonia treatment is completed.   2. Chronic systolic HF - echo LVEF [id number] this admit, stable from prior study.  - continue medical therapt with coreg, entresto, aldactone.  [place] for discharge from cardiac standpoint. We will arrange outpatient lexiscan (chronic balance issues cannot run on treadmill) and outpatient clinic f/u.     Carlyle Dolly, M.D.

## 2016-12-24 NOTE — Discharge Instructions (Signed)
Dr. Alan Ripper office will call you with appointment for outpt stress test.  Then you will have a follow up appt.   Acute Bronchitis, Adult Acute bronchitis is sudden (acute) swelling of the air tubes (bronchi) in the lungs. Acute bronchitis causes these tubes to fill with mucus, which can make it hard to breathe. It can also cause coughing or wheezing. In adults, acute bronchitis usually goes away within 2 weeks. A cough caused by bronchitis may last up to 3 weeks. Smoking, allergies, and asthma can make the condition worse. Repeated episodes of bronchitis may cause further lung problems, such as chronic obstructive pulmonary disease (COPD). What are the causes? This condition can be caused by germs and by substances that irritate the lungs, including:  Cold and flu viruses. This condition is most often caused by the same virus that causes a cold.  Bacteria.  Exposure to tobacco smoke, dust, fumes, and air pollution. What increases the risk? This condition is more likely to develop in people who:  Have close contact with someone with acute bronchitis.  Are exposed to lung irritants, such as tobacco smoke, dust, fumes, and vapors.  Have a weak immune system.  Have a respiratory condition such as asthma. What are the signs or symptoms? Symptoms of this condition include:  A cough.  Coughing up clear, yellow, or green mucus.  Wheezing.  Chest congestion.  Shortness of breath.  A fever.  Body aches.  Chills.  A sore throat. How is this diagnosed? This condition is usually diagnosed with a physical exam. During the exam, your health care provider may order tests, such as chest X-rays, to rule out other conditions. He or she may also:  Test a sample of your mucus for bacterial infection.  Check the level of oxygen in your blood. This is done to check for pneumonia.  Do a chest X-ray or lung function testing to rule out pneumonia and other conditions.  Perform blood  tests. Your health care provider will also ask about your symptoms and medical history. How is this treated? Most cases of acute bronchitis clear up over time without treatment. Your health care provider may recommend:  Drinking more fluids. Drinking more makes your mucus thinner, which may make it easier to breathe.  Taking a medicine for a fever or cough.  Taking an antibiotic medicine.  Using an inhaler to help improve shortness of breath and to control a cough.  Using a cool mist vaporizer or humidifier to make it easier to breathe. Follow these instructions at home: Medicines  Take over-the-counter and prescription medicines only as told by your health care provider.  If you were prescribed an antibiotic, take it as told by your health care provider. Do not stop taking the antibiotic even if you start to feel better. General instructions  Get plenty of rest.  Drink enough fluids to keep your urine clear or pale yellow.  Avoid smoking and secondhand smoke. Exposure to cigarette smoke or irritating chemicals will make bronchitis worse. If you smoke and you need help quitting, ask your health care provider. Quitting smoking will help your lungs heal faster.  Use an inhaler, cool mist vaporizer, or humidifier as told by your health care provider.  Keep all follow-up visits as told by your health care provider. This is important. How is this prevented? To lower your risk of getting this condition again:  Wash your hands often with soap and water. If soap and water are not available, use hand sanitizer.  Avoid contact with people who have cold symptoms.  Try not to touch your hands to your mouth, nose, or eyes.  Make sure to get the flu shot every year. Contact a health care provider if:  Your symptoms do not improve in 2 weeks of treatment. Get help right away if:  You cough up blood.  You have chest pain.  You have severe shortness of breath.  You become  dehydrated.  You faint or keep feeling like you are going to faint.  You keep vomiting.  You have a severe headache.  Your fever or chills gets worse. This information is not intended to replace advice given to you by your health care provider. Make sure you discuss any questions you have with your health care provider. Document Released: 11/23/2004 Document Revised: 05/10/2016 Document Reviewed: 04/05/2016 Elsevier Interactive Patient Education  2017 [place].  Discharge instructions:  Please get your medications reviewed and adjusted by your Primary MD.  Please request your Primary MD to go over all Hospital Tests and Procedure/Radiological results at the follow up, please get all Hospital records sent to your Prim MD by signing hospital release before you go home.  If you had Pneumonia of Lung problems at the Hospital: Please get a 2 view Chest X ray done in 6-8 weeks after hospital discharge or sooner if instructed by your Primary MD.  If you have Congestive Heart Failure: Please call your Cardiologist or Primary MD anytime you have any of the following symptoms:  1) 3 pound weight gain in 24 hours or 5 pounds in 1 week  2) shortness of breath, with or without a dry hacking cough  3) swelling in the hands, feet or stomach  4) if you have to sleep on extra pillows at night in order to breathe  Follow cardiac low salt diet and 1.5 lit/day fluid restriction.  If you have diabetes Accuchecks 4 times/day, Once in AM empty stomach and then before each meal. Log in all results and show them to your primary doctor at your next visit. If any glucose reading is under 80 or above 300 call your primary MD immediately.  If you have Seizure/Convulsions/Epilepsy: Please do not drive, operate heavy machinery, participate in activities at heights or participate in high speed sports until you have seen by Primary MD or a Neurologist and advised to do so again.  If you had  Gastrointestinal Bleeding: Please ask your Primary MD to check a complete blood count within one week of discharge or at your next visit. Your endoscopic/colonoscopic biopsies that are pending at the time of discharge, will also need to followed by your Primary MD.  Get Medicines reviewed and adjusted. Please take all your medications with you for your next visit with your Primary MD  Please request your Primary MD to go over all hospital tests and procedure/radiological results at the follow up, please ask your Primary MD to get all Hospital records sent to his/her office.  If you experience worsening of your admission symptoms, develop shortness of breath, life threatening emergency, suicidal or homicidal thoughts you must seek medical attention immediately by calling 911 or calling your MD immediately  if symptoms less severe.  You must read complete instructions/literature along with all the possible adverse reactions/side effects for all the Medicines you take and that have been prescribed to you. Take any new Medicines after you have completely understood and accpet all the possible adverse reactions/side effects.   Do not drive or operate  heavy machinery when taking Pain medications.   Do not take more than prescribed Pain, Sleep and Anxiety Medications  Special Instructions: If you have smoked or chewed Tobacco  in the last 2 yrs please stop smoking, stop any regular Alcohol  and or any Recreational drug use.  Wear Seat belts while driving.  Please note You were cared for by a hospitalist during your hospital stay. If you have any questions about your discharge medications or the care you received while you were in the hospital after you are discharged, you can call the unit and asked to speak with the hospitalist on call if the hospitalist that took care of you is not available. Once you are discharged, your primary care physician will handle any further medical issues. Please note that  NO REFILLS for any discharge medications will be authorized once you are discharged, as it is imperative that you return to your primary care physician (or establish a relationship with a primary care physician if you do not have one) for your aftercare needs so that they can reassess your need for medications and monitor your lab values.  You can reach the hospitalist office at phone [phone] or fax [phone]   If you do not have a primary care physician, you can call [phone] for a physician referral.

## 2016-12-24 NOTE — Discharge Summary (Signed)
Physician Discharge Summary  CHANNEL TEE [id number] DOB: 11-17-1950  PCP: Scarlette Calico, MD  Admit date: 12/22/2016 Discharge date: 12/24/2016  Recommendations for Outpatient Follow-up:  1. Dr. Scarlette Calico, PCP in 5 days: Advised to follow up regarding better management of her poorly controlled diabetes and for post hospital discharge follow-up. Please follow final blood culture results and A1c that were sent from the hospital. 2. Dr. Dorris Carnes, Cardiology: MDs office will arrange follow-up appointment for stress test.  Home Health: None Equipment/Devices: None    Discharge Condition: Improved and stable  CODE STATUS: Full  Diet recommendation: Heart healthy and diabetic diet.  Discharge Diagnoses:  Active Problems:   Essential hypertension   Hyperlipidemia with target LDL less than 70   CAD - STEMI s/p urgent LAD DES 09/07/12, residual RCA and Dx disease, EF 45-50%   Congestive dilated cardiomyopathy (HCC)   DM (diabetes mellitus), type 2 (HCC)   Ischemic cardiomyopathy   Elevated troponin   CAP (community acquired pneumonia)   HCAP (healthcare-associated pneumonia)   Brief/Interim Summary: 66 year old female, PMH of HTN, DM 2, CAD with STEMI 2013 status post DES to LAD and distal LAD PTCA, post CVA 0000000, chronic systolic CHF (LVEF [id number] percent by most recent echo 07/2015), breast cancer, GERD, presented to ED after she noted chest congestion while at work where she was able to hear her own rattling breath sounds, mild intermittent dry cough and dyspnea without chest pain, fever or chills. In the ED, chest x-ray negative for pneumonia but CTA chest showed focal airspace opacity in left lung base compatible with pneumonia and no PE. Elevated troponin secondary to demand ischemia. Cardiology was consulted.   Assessment & Plan:   Acute viral bronchitis (HCAP felt less likely) - Reported symptoms of chest congestion, mild cough and dyspnea. Initial chest x-ray negative for  pneumonia but CTA chest showed focal airspace opacity in left lung base compatible with pneumonia and no PE. No fever or leukocytosis. - Last hospitalization 07/29/15-07/30/15 - Empirically treatedwith IV vancomycin and cefepime. - MRSA screen and pro-calcitonin negative. Blood cultures 2: Negative to date. - From the outset, her clinical picture was not consistent with pneumonia except for focal airspace disease seen on CT chest. I discussed extensively with infectious disease M.D. on call who agreed and recommended discharging without antibiotics. - She may have had nonspecific acute viral bronchitis. Etiology of left lung base airspace disease is not clear. - Recommend follow-up imaging (? Repeat CT noncontrasted) in 4 weeks to ensure resolution of pneumonia findings.  Elevated troponin/demand ischemia in patient with CAD - Cardiology consultation appreciated. Agree that this is demand ischemia. Continue aspirin, statins, beta blockers, nitrates. Cardiology has cleared for discharge and will arrange for outpatient stress test.  Essential hypertension Controlled  Hyperlipidemia Continue atorvastatin and Zetia.  Type II DM with neuropathy Uncontrolled in the hospital. CBGs ranging in the 200s-300s. Patient states that she checks her CBGs once daily at home prior to going to work and her CBGs fluctuate. Repeat A1c is pending. Advised her to closely follow-up with her PCP and will need adjustment of her medications for better diabetes control. She verbalized understanding.  Chronic systolic CHF/ischemic cardiomyopathy Compensated. Continue furosemide, nitrites, Entresto and Aldactone. Follow repeat echo-EF unchanged and report as below. Cardiology follow-up appreciated.     Consultants:   Cardiology  Procedures:   2-D echo 12/23/16: Study Conclusions  - Left ventricle: The cavity size was normal. Wall thickness was   increased in a pattern of  mild LVH. Systolic function was  mildly   to moderately reduced. The estimated ejection fraction was in the   range of 40% to 45%. There is hypokinesis of the anteroseptal and   apical myocardium. Doppler parameters are consistent with   abnormal left ventricular relaxation (grade 1 diastolic   dysfunction). - Left atrium: The atrium was mildly dilated. - Pericardium, extracardiac: A trivial pericardial effusion was   identified.  Impressions:  - Technically difficult; definity used; there appears to be   hypokinesis of the distal septum and apex with overall mild to   moderate LV dysfunction; mild LVH; grade 1 diastolic dysfunction;   mild LAE.   Discharge Instructions  Discharge Instructions    (HEART FAILURE PATIENTS) Call MD:  Anytime you have any of the following symptoms: 1) 3 pound weight gain in 24 hours or 5 pounds in 1 week 2) shortness of breath, with or without a dry hacking cough 3) swelling in the hands, feet or stomach 4) if you have to sleep on extra pillows at night in order to breathe.    Complete by:  As directed    Call MD for:  difficulty breathing, headache or visual disturbances    Complete by:  As directed    Call MD for:  extreme fatigue    Complete by:  As directed    Call MD for:  persistant dizziness or light-headedness    Complete by:  As directed    Call MD for:  temperature >100.4    Complete by:  As directed    Diet - low sodium heart healthy    Complete by:  As directed    Diet Carb Modified    Complete by:  As directed    Increase activity slowly    Complete by:  As directed        Medication List    TAKE these medications   aspirin 81 MG EC tablet Take 1 tablet (81 mg total) by mouth daily.   atorvastatin 80 MG tablet Commonly known as:  LIPITOR Take 1 tablet (80 mg total) by mouth daily with supper.   carvedilol 25 MG tablet Commonly known as:  COREG Take 1 tablet (25 mg total) by mouth 2 (two) times daily with a meal.   ezetimibe 10 MG tablet Commonly  known as:  ZETIA Take 1 tablet (10 mg total) by mouth daily.   furosemide 40 MG tablet Commonly known as:  LASIX Take 1 tablet (40 mg total) by mouth daily.   glucose blood test strip Commonly known as:  ONETOUCH VERIO Use TID   guaiFENesin-dextromethorphan 100-10 MG/5ML syrup Commonly known as:  ROBITUSSIN DM Take 5 mLs by mouth every 4 (four) hours as needed for cough.   Insulin Glargine 100 UNIT/ML Solostar Pen Commonly known as:  LANTUS SOLOSTAR Inject 18 units [place] at bedtime.   insulin lispro 100 UNIT/ML injection Commonly known as:  HUMALOG Inject 15-18 Units into the skin every morning.   isosorbide mononitrate 60 MG 24 hr tablet Commonly known as:  IMDUR Take 1 tablet (60 mg total) by mouth daily.   nitroGLYCERIN 0.4 MG SL tablet Commonly known as:  NITROSTAT Place 0.4 mg under the tongue every 5 (five) minutes as needed for chest pain.   sacubitril-valsartan 49-51 MG Commonly known as:  ENTRESTO Take 1 tablet by mouth 2 (two) times daily.   spironolactone 25 MG tablet Commonly known as:  ALDACTONE Take 1 tablet (25 mg total) by mouth daily.  Follow-up Information    Dorris Carnes, MD Follow up.   Specialty:  Cardiology Why:  office will call with date and time and appt for stress test. Contact information: [address] [place] [address] [phone]        Scarlette Calico, MD Follow up in 5 day(s).   Specialty:  Internal Medicine Why:  Advised patient to follow up regarding post hospital discharge and for better control of her diabetes. Contact information: [address] [address] [phone]          No Known Allergies   Procedures/Studies: Dg Chest 2 View  Result Date: 12/22/2016 CLINICAL DATA:  Chest congestion today. Personal history of congestive heart failure and breast cancer. EXAM: CHEST  2 VIEW COMPARISON:  None. FINDINGS: The heart size and mediastinal contours are within normal  limits. Both lungs are clear. The visualized skeletal structures are unremarkable. IMPRESSION: Negative two view chest x-ray Electronically Signed   By: San Morelle M.D.   On: 12/22/2016 17:28   Ct Angio Chest Pe W And/or Wo Contrast  Result Date: 12/22/2016 CLINICAL DATA:  Acute onset of generalized chest congestion and shortness of breath. Initial encounter. EXAM: CT ANGIOGRAPHY CHEST WITH CONTRAST TECHNIQUE: Multidetector CT imaging of the chest was performed using the standard protocol during bolus administration of intravenous contrast. Multiplanar CT image reconstructions and MIPs were obtained to evaluate the vascular anatomy. CONTRAST:  100 mL of Isovue 370 IV contrast COMPARISON:  Chest radiograph performed earlier today at 5:20 p.m. FINDINGS: Cardiovascular:  There is no evidence of pulmonary embolus. Diffuse coronary artery calcifications are seen. The heart remains normal in size. Scattered calcification noted along the aortic arch and descending thoracic aorta. The great vessels are grossly unremarkable in appearance. Mediastinum/Nodes: The mediastinum is unremarkable in appearance. No mediastinal lymphadenopathy is seen. No pericardial effusion is identified. The thyroid gland is unremarkable. No axillary lymphadenopathy is seen. Lungs/Pleura: Focal airspace opacity at the left lung base is compatible with pneumonia. No pleural effusion or pneumothorax is seen. No masses are identified. Upper Abdomen: The visualized portions of the liver and spleen are grossly unremarkable. Musculoskeletal: No acute osseous abnormalities are identified. Mild degenerative change is noted at the lower cervical spine. The visualized musculature is unremarkable in appearance. Review of the MIP images confirms the above findings. IMPRESSION: 1. No evidence of pulmonary embolus. 2. Focal airspace opacity at the left lung base is compatible with pneumonia. 3. Diffuse coronary artery calcifications seen.  Electronically Signed   By: Garald Balding M.D.   On: 12/22/2016 20:01      Subjective: Mild intermittent dry cough. Denies dyspnea or chest pain.  Discharge Exam:  Vitals:   12/23/16 2127 12/24/16 0338 12/24/16 0430 12/24/16 0434  BP: (!) 109/56 (!) 160/95  136/88  Pulse: 87 73  75  Resp: 18 18    Temp: 98.5 F (36.9 C) 97.8 F (36.6 C)    TempSrc: Oral Oral    SpO2: 95% 98%    Weight:   65.2 kg (143 lb 11.2 oz)   Height:        General exam: Pleasant middle-aged female sitting up comfortably in bed. Respiratory system: Clear to auscultation. Respiratory effort normal. Cardiovascular system: S1 & S2 heard, RRR. No JVD, murmurs, rubs, gallops or clicks. No pedal edema. Telemetry: Sinus rhythm. Gastrointestinal system: Abdomen is nondistended, soft and nontender. No organomegaly or masses felt. Normal bowel sounds heard. Central nervous system: Alert and oriented.  No focal neurological deficits. Extremities: Symmetric 5 x 5 power. Skin: No rashes, lesions or ulcers Psychiatry: Judgement and insight appear normal. Mood & affect appropriate.     The results of significant diagnostics from this hospitalization (including imaging, microbiology, ancillary and laboratory) are listed below for reference.     Microbiology: Recent Results (from the past 240 hour(s))  Blood culture (routine x 2)     Status: None (Preliminary result)   Collection Time: 12/22/16  8:30 PM  Result Value Ref Range Status   Specimen Description BLOOD RIGHT HAND  Final   Special Requests BOTTLES DRAWN AEROBIC AND ANAEROBIC 5CC EACH  Final   Culture   Final    NO GROWTH < 12 HOURS Performed at [facility], [place] [address], [place], [place] [address]    Report Status PENDING  Incomplete  Blood culture (routine x 2)     Status: None (Preliminary result)   Collection Time: 12/22/16  8:55 PM  Result Value Ref Range Status   Specimen Description BLOOD RIGHT FOREARM  Final   Special Requests  BOTTLES DRAWN AEROBIC AND ANAEROBIC 5CC EACH  Final   Culture   Final    NO GROWTH < 12 HOURS Performed at [facility], [place] [address], [place], [place] [address]    Report Status PENDING  Incomplete  MRSA PCR Screening     Status: None   Collection Time: 12/23/16  4:00 PM  Result Value Ref Range Status   MRSA by PCR NEGATIVE NEGATIVE Final    Comment:        The GeneXpert MRSA Assay (FDA approved for NASAL specimens only), is one component of a comprehensive MRSA colonization surveillance program. It is not intended to diagnose MRSA infection nor to guide or monitor treatment for MRSA infections.      Labs: BNP (last 3 results)  Recent Labs  04/07/16 1259  BNP [id number]   Basic Metabolic Panel:  Recent Labs Lab 12/22/16 1722 12/23/16 0515  NA 133* 135  K 3.9 3.5  CL 93* 100*  CO2 27 26  GLUCOSE 370* 261*  BUN 20 16  CREATININE 0.94 0.64  CALCIUM 10.2 8.8*  MG  --  1.9  PHOS  --  3.2   Liver Function Tests:  Recent Labs Lab 12/23/16 0515  AST 15  ALT 12*  ALKPHOS 55  BILITOT 1.3*  PROT 5.9*  ALBUMIN 3.2*    CBC:  Recent Labs Lab 12/22/16 1722 12/23/16 0515  WBC 6.5 4.2  HGB 15.6* 14.3  HCT 44.2 41.1  MCV 84.2 84.4  PLT 233 163   Cardiac Enzymes:  Recent Labs Lab 12/22/16 2334 12/23/16 0515 12/23/16 1121  TROPONINI 0.70* 0.66* 0.54*   BNP: Invalid input(s): POCBNP CBG:  Recent Labs Lab 12/23/16 1146 12/23/16 1659 12/23/16 2126 12/24/16 0742 12/24/16 1200  GLUCAP 223* 347* 315* 290* 300*   Thyroid function studies  Recent Labs  12/23/16 0515  TSH 1.496      Time coordinating discharge: Over 30 minutes  SIGNED:  Vernell Leep, MD, FACP, [place]. [facility] Pager [phone]  If 7PM-7AM, please contact night-coverage [email or web address] Password [email or web address] 12/24/2016, 1:22 PM

## 2016-12-25 ENCOUNTER — Telehealth: Payer: Self-pay | Admitting: *Deleted

## 2016-12-25 LAB — HEMOGLOBIN A1C
Hgb A1c MFr Bld: >15.5 % — ABNORMAL HIGH (ref 4.8–5.6)
Mean Plasma Glucose: >398 mg/dL

## 2016-12-25 NOTE — Telephone Encounter (Signed)
Tried calling pt to set-up TCM hospital f/u appt no answer LMOM RTC to make hosp f/u appt. Pt hasn't seen MD since 01/2015 need to make sure Dr. Ronnald Ramp still her PCP...Marisa Forbes

## 2016-12-26 NOTE — Telephone Encounter (Signed)
Called pt verified if Dr. Ronnald Ramp was her PCP pt states she has another MD. Refuse appt w/Dr. Ronnald Ramp....Marisa Forbes

## 2016-12-27 LAB — CULTURE, BLOOD (ROUTINE X 2)
Culture: NO GROWTH
Culture: NO GROWTH

## 2017-02-10 ENCOUNTER — Other Ambulatory Visit: Payer: Self-pay | Admitting: Physician Assistant

## 2017-02-19 ENCOUNTER — Other Ambulatory Visit: Payer: Self-pay | Admitting: Internal Medicine

## 2017-02-19 DIAGNOSIS — Z1231 Encounter for screening mammogram for malignant neoplasm of breast: Secondary | ICD-10-CM

## 2017-02-19 DIAGNOSIS — Z853 Personal history of malignant neoplasm of breast: Secondary | ICD-10-CM

## 2017-02-19 DIAGNOSIS — Z9889 Other specified postprocedural states: Secondary | ICD-10-CM

## 2017-02-23 ENCOUNTER — Ambulatory Visit
Admission: RE | Admit: 2017-02-23 | Discharge: 2017-02-23 | Disposition: A | Discharge status: Home / Self Care | Payer: Medicare Other | Source: Ambulatory Visit | Attending: Internal Medicine | Admitting: Internal Medicine

## 2017-02-23 DIAGNOSIS — Z853 Personal history of malignant neoplasm of breast: Secondary | ICD-10-CM

## 2017-02-23 DIAGNOSIS — Z9889 Other specified postprocedural states: Secondary | ICD-10-CM

## 2017-02-24 LAB — HM MAMMOGRAPHY

## 2017-04-18 ENCOUNTER — Other Ambulatory Visit: Payer: Self-pay | Admitting: Internal Medicine

## 2017-05-01 ENCOUNTER — Telehealth: Payer: Self-pay | Admitting: Internal Medicine

## 2017-05-01 NOTE — Telephone Encounter (Signed)
Close encounter 

## 2017-10-11 ENCOUNTER — Other Ambulatory Visit: Payer: Self-pay | Admitting: Internal Medicine

## 2017-11-17 DIAGNOSIS — J810 Acute pulmonary edema: Secondary | ICD-10-CM | POA: Diagnosis not present

## 2017-11-17 DIAGNOSIS — Z79899 Other long term (current) drug therapy: Secondary | ICD-10-CM | POA: Diagnosis not present

## 2017-11-17 DIAGNOSIS — E1165 Type 2 diabetes mellitus with hyperglycemia: Secondary | ICD-10-CM | POA: Diagnosis not present

## 2017-11-17 DIAGNOSIS — I110 Hypertensive heart disease with heart failure: Secondary | ICD-10-CM | POA: Diagnosis not present

## 2017-11-17 DIAGNOSIS — Z794 Long term (current) use of insulin: Secondary | ICD-10-CM | POA: Diagnosis not present

## 2017-11-17 DIAGNOSIS — R0602 Shortness of breath: Secondary | ICD-10-CM | POA: Diagnosis not present

## 2017-11-17 DIAGNOSIS — I5043 Acute on chronic combined systolic (congestive) and diastolic (congestive) heart failure: Secondary | ICD-10-CM | POA: Diagnosis not present

## 2017-11-17 DIAGNOSIS — R0789 Other chest pain: Secondary | ICD-10-CM | POA: Diagnosis not present

## 2017-11-17 DIAGNOSIS — Z7982 Long term (current) use of aspirin: Secondary | ICD-10-CM | POA: Diagnosis not present

## 2017-11-17 DIAGNOSIS — J9601 Acute respiratory failure with hypoxia: Secondary | ICD-10-CM | POA: Diagnosis not present

## 2017-11-17 DIAGNOSIS — I10 Essential (primary) hypertension: Secondary | ICD-10-CM | POA: Diagnosis not present

## 2017-11-18 DIAGNOSIS — J810 Acute pulmonary edema: Secondary | ICD-10-CM | POA: Diagnosis not present

## 2017-11-18 DIAGNOSIS — Z794 Long term (current) use of insulin: Secondary | ICD-10-CM | POA: Diagnosis not present

## 2017-11-18 DIAGNOSIS — E1165 Type 2 diabetes mellitus with hyperglycemia: Secondary | ICD-10-CM | POA: Diagnosis not present

## 2017-11-18 DIAGNOSIS — I10 Essential (primary) hypertension: Secondary | ICD-10-CM | POA: Diagnosis not present

## 2017-11-18 DIAGNOSIS — I5043 Acute on chronic combined systolic (congestive) and diastolic (congestive) heart failure: Secondary | ICD-10-CM | POA: Diagnosis not present

## 2017-11-18 DIAGNOSIS — J9601 Acute respiratory failure with hypoxia: Secondary | ICD-10-CM | POA: Diagnosis not present

## 2017-11-20 MED ORDER — NITROGLYCERIN 0.4 MG SL SUBL
0.40 | SUBLINGUAL_TABLET | SUBLINGUAL | Status: DC
Start: ? — End: 2017-11-20

## 2017-11-20 MED ORDER — INSULIN LISPRO 100 UNIT/ML ~~LOC~~ SOLN
SUBCUTANEOUS | Status: DC
Start: ? — End: 2017-11-20

## 2017-11-20 MED ORDER — FUROSEMIDE 10 MG/ML IJ SOLN
60.00 | INTRAMUSCULAR | Status: DC
Start: 2017-11-18 — End: 2017-11-20

## 2017-11-20 MED ORDER — ENOXAPARIN SODIUM 40 MG/0.4ML ~~LOC~~ SOLN
40.00 | SUBCUTANEOUS | Status: DC
Start: 2017-11-19 — End: 2017-11-20

## 2017-11-20 MED ORDER — EZETIMIBE 10 MG PO TABS
10.00 | ORAL_TABLET | ORAL | Status: DC
Start: 2017-11-19 — End: 2017-11-20

## 2017-11-20 MED ORDER — ASPIRIN EC 81 MG PO TBEC
81.00 | DELAYED_RELEASE_TABLET | ORAL | Status: DC
Start: 2017-11-19 — End: 2017-11-20

## 2017-11-20 MED ORDER — MAGNESIUM HYDROXIDE 400 MG/5ML PO SUSP
30.00 | ORAL | Status: DC
Start: ? — End: 2017-11-20

## 2017-11-20 MED ORDER — MORPHINE SULFATE (PF) 4 MG/ML IV SOLN
2.00 | INTRAVENOUS | Status: DC
Start: ? — End: 2017-11-20

## 2017-11-20 MED ORDER — ACETAMINOPHEN 325 MG PO TABS
650.00 | ORAL_TABLET | ORAL | Status: DC
Start: ? — End: 2017-11-20

## 2017-11-20 MED ORDER — HYDROCODONE-ACETAMINOPHEN 10-325 MG PO TABS
ORAL_TABLET | ORAL | Status: DC
Start: ? — End: 2017-11-20

## 2017-11-20 MED ORDER — INSULIN GLARGINE 100 UNIT/ML ~~LOC~~ SOLN
SUBCUTANEOUS | Status: DC
Start: 2017-11-18 — End: 2017-11-20

## 2017-11-20 MED ORDER — GENERIC EXTERNAL MEDICATION
Status: DC
Start: ? — End: 2017-11-20

## 2017-11-20 MED ORDER — SENNA-DOCUSATE SODIUM 8.6-50 MG PO TABS
ORAL_TABLET | ORAL | Status: DC
Start: ? — End: 2017-11-20

## 2017-11-20 MED ORDER — PROMETHAZINE HCL 25 MG/ML IJ SOLN
12.50 | INTRAMUSCULAR | Status: DC
Start: ? — End: 2017-11-20

## 2017-11-20 MED ORDER — CARVEDILOL 25 MG PO TABS
25.00 | ORAL_TABLET | ORAL | Status: DC
Start: 2017-11-18 — End: 2017-11-20

## 2017-11-20 MED ORDER — ACETAMINOPHEN 650 MG RE SUPP
650.00 | RECTAL | Status: DC
Start: ? — End: 2017-11-20

## 2017-11-20 MED ORDER — NITROGLYCERIN 2 % TD OINT
TOPICAL_OINTMENT | TRANSDERMAL | Status: DC
Start: ? — End: 2017-11-20

## 2017-11-20 MED ORDER — HYDROCODONE-ACETAMINOPHEN 5-325 MG PO TABS
ORAL_TABLET | ORAL | Status: DC
Start: ? — End: 2017-11-20

## 2017-11-20 MED ORDER — SPIRONOLACTONE 25 MG PO TABS
25.00 | ORAL_TABLET | ORAL | Status: DC
Start: 2017-11-19 — End: 2017-11-20

## 2017-11-20 MED ORDER — INSULIN LISPRO 100 UNIT/ML ~~LOC~~ SOLN
SUBCUTANEOUS | Status: DC
Start: 2017-11-18 — End: 2017-11-20

## 2017-11-20 MED ORDER — PROMETHAZINE HCL 25 MG PO TABS
12.50 | ORAL_TABLET | ORAL | Status: DC
Start: ? — End: 2017-11-20

## 2017-11-20 MED ORDER — ISOSORBIDE MONONITRATE ER 60 MG PO TB24
60.00 | ORAL_TABLET | ORAL | Status: DC
Start: 2017-11-19 — End: 2017-11-20

## 2017-11-20 MED ORDER — SACUBITRIL-VALSARTAN 49-51 MG PO TABS
ORAL_TABLET | ORAL | Status: DC
Start: 2017-11-18 — End: 2017-11-20

## 2017-12-03 ENCOUNTER — Ambulatory Visit: Payer: Medicare Other | Admitting: Internal Medicine

## 2017-12-05 ENCOUNTER — Encounter: Payer: Self-pay | Admitting: Physician Assistant

## 2017-12-12 ENCOUNTER — Encounter: Payer: Self-pay | Admitting: Physician Assistant

## 2017-12-12 ENCOUNTER — Ambulatory Visit (INDEPENDENT_AMBULATORY_CARE_PROVIDER_SITE_OTHER): Payer: Medicare Other | Admitting: Physician Assistant

## 2017-12-12 VITALS — BP 110/70 | HR 96 | Ht 65.0 in | Wt 135.1 lb

## 2017-12-12 DIAGNOSIS — I5042 Chronic combined systolic (congestive) and diastolic (congestive) heart failure: Secondary | ICD-10-CM

## 2017-12-12 DIAGNOSIS — Z794 Long term (current) use of insulin: Secondary | ICD-10-CM

## 2017-12-12 DIAGNOSIS — E1142 Type 2 diabetes mellitus with diabetic polyneuropathy: Secondary | ICD-10-CM

## 2017-12-12 DIAGNOSIS — E785 Hyperlipidemia, unspecified: Secondary | ICD-10-CM | POA: Diagnosis not present

## 2017-12-12 DIAGNOSIS — I2510 Atherosclerotic heart disease of native coronary artery without angina pectoris: Secondary | ICD-10-CM | POA: Diagnosis not present

## 2017-12-12 DIAGNOSIS — I10 Essential (primary) hypertension: Secondary | ICD-10-CM | POA: Diagnosis not present

## 2017-12-12 DIAGNOSIS — I701 Atherosclerosis of renal artery: Secondary | ICD-10-CM

## 2017-12-12 MED ORDER — ISOSORBIDE MONONITRATE ER 60 MG PO TB24: 60.0000 mg | ORAL_TABLET | Freq: Every day | ORAL | 0 refills | Status: DC

## 2017-12-12 MED ORDER — EZETIMIBE 10 MG PO TABS: ORAL_TABLET | ORAL | 1 refills | Status: DC

## 2017-12-12 MED ORDER — FUROSEMIDE 40 MG PO TABS: 40.0000 mg | ORAL_TABLET | Freq: Every day | ORAL | 3 refills | Status: DC

## 2017-12-12 MED ORDER — SACUBITRIL-VALSARTAN 49-51 MG PO TABS: 1.0000 | ORAL_TABLET | Freq: Two times a day (BID) | ORAL | 7 refills | Status: DC

## 2017-12-12 MED ORDER — NITROGLYCERIN 0.4 MG SL SUBL: 0.4000 mg | SUBLINGUAL_TABLET | SUBLINGUAL | 3 refills | Status: DC | PRN

## 2017-12-12 MED ORDER — ATORVASTATIN CALCIUM 80 MG PO TABS: 80.0000 mg | ORAL_TABLET | Freq: Every day | ORAL | 11 refills | Status: DC

## 2017-12-12 MED ORDER — SPIRONOLACTONE 25 MG PO TABS: 25.0000 mg | ORAL_TABLET | Freq: Every day | ORAL | 3 refills | Status: DC

## 2017-12-12 MED ORDER — CARVEDILOL 25 MG PO TABS: ORAL_TABLET | ORAL | 1 refills | Status: DC

## 2017-12-12 NOTE — Progress Notes (Signed)
Cardiology Office Note:    Date:  12/12/2017   ID:  Marisa Forbes, DOB 1951-05-04, MRN [id number]  PCP:  Janith Lima, MD  Cardiologist:  Dorris Carnes, MD   Referring MD: Janith Lima, MD   Chief Complaint  Patient presents with  . Hospitalization Follow-up    Heart failure    History of Present Illness:    Marisa Forbes is a 67 y.o. female with a hx of coronary artery disease status post ST elevation myocardial infarction in November 2013 treated with drug-eluting stent to the LAD and angioplasty to the distal LAD, post cardiac catheterization stroke, carotid artery stenosis, renal artery stenosis, chronic systolic heart failure, diabetes, hypertension, hyperlipidemia, breast cancer status post lumpectomy, chemotherapy and radiation.  Blood pressure has been difficult to control in the past.  Renal artery ultrasound demonstrated >60% left renal artery stenosis in 2015.  She did not keep follow-up with PV.    She was admitted in June 2016 with transient ischemic attack versus 3rd nerve palsy in the setting of hypertensive emergency.  EF was down from 45-50 in 2014 to 20-25 by echocardiogram.  A cardiac stress MRI demonstrated scar in the entire anterior, mid anteroseptal and apical septal walls with minimal ischemia in the mid inferoseptal wall.  This was felt to be due to hypertension, prior chemotherapy and cardiac catheterization was not recommended.  She was discharged home on LifeVest and a follow-up echo in September 2016 demonstrated improvement in LV function with EF 40-45.    She was admitted to [facility] in December 2017 with decompensated heart failure and flash pulmonary edema.  Echo demonstrated EF 30-35 and she was noted to have runs of nonsustained ventricular tachycardia.  She was last seen in this office January 2018.  Cardiac MRI in January 2018 demonstrated EF 42 with late gadolinium uptake in the anterior, anteroseptal and apical anterior walls with poor chance of  recovery if revascularized.    She was admitted to [facility] in February 2018 with pneumonia.  She had an elevated troponin.  EF was stable on echocardiogram at 40-45.  It was felt that her elevated troponin was related to demand ischemia and an outpatient nuclear stress test was recommended.  This was never performed.   She was most recently admitted 1/19-1/20 [facility] with decompensated heart failure and acute pulmonary edema in the setting of hypertensive emergency.  Systolic blood pressure was 220 upon arrival.  She was treated with IV Lasix.  Marisa Forbes returns for follow-up.  She is here alone.  Since going to the hospital, she has been doing fairly well.  She denies significant shortness of breath.  She denies chest discomfort.  She denies syncope.  She denies orthopnea, PND or edema.  She does not use much salt.  Prior CV studies:   The following studies were reviewed today:  Echo 12/23/16 Mild LVH, EF 40-45, anteroseptal and apical hypokinesis, grade 1 diastolic dysfunction, mild LAE, trivial pericardial effusion  Cardiac MRI 11/22/16 IMPRESSION: 1. Normal left ventricular size with moderate concentric left ventricular hypertrophy and mildly to moderately decreased systolic function (LVEF = 42%).  There is akinesis of the apical septal, anterior, mid anteroseptal and hypokinesis of the mid and basal anterior walls.  There is late gadolinium enhancement in the basal anterior, mid anterior, anteroseptal, and apical anterior and septal walls with 75-100% transmurality and poor chance of recovery if revascularized.  Collectivelly, these findings are consistent with ischemic cardiomyopathy. There is  no indication for an ICD placement.  2. Normal right ventricular size, thickness and systolic function (RVEF = 55%) with no regional wall motion abnormalities.  3.  Normal biatrial size.  4.  Mild mitral regurgitation.  Limited echo 07/15/15 Mild LVH,  EF 40-45, anteroseptal akinesis  Stress cardiac MRI 04/14/15 IMPRESSION: 1. Normal left ventricular size with moderate concentric hypertrophy and severely decreased systolic function (LVEF = 26%). There is akinesis of the basal anterior, mid anterior, anteroseptal and apical anterior and septal walls.  2. Normal right ventricular size, thickness and low normal systolic function (RVEF = 42%).  3. Stress test indicates that there is a scar in the entire anterior, mid anteroseptal and apical septal walls with minimal ischemia in the mid inferoseptal wall.  4. Late gadolinium images show a scar a scar in the entire anterior, mid anteroseptal and apical septal walls with poor recovery if Revascularized.  Echo 04/13/15 Moderate concentric LVH, EF 25-30, diffuse HK, anterior akinesis  Carotid US 04/12/15 Findings suggest 1-39% internal carotid artery stenosis bilaterally.   Renal artery [place] 08/19/14 L RA >60%  Nuclear stress test 10/18/12 Mid to distal and apical LAD territory scar, no ischemia, EF 42  Cardiac catheterization 09/07/12 LAD stent patent, diffuse 40-70 beyond stent; D1 mid 70-80 (small vessel); tiny D2 with shelflike lesion thought to be a small dissection flap LCx irregularities RCA mid 50, distal 70  Past Medical History:  Diagnosis Date  . Arthritis    back  . Balance problems    due to stroke  . Breast cancer ([place]) 12/03/12   a. 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 S/p chemo with CMF and radiation.  . CHF (congestive heart failure) ([place])    On Monday she said her cardiologist was concerned for possible CHF, but is now improving-per pt.(07/26/15)  . Coronary artery disease    a. STEMI 08/2012: s/p DES to LAD, PTCA to downstream LAD. b. Relook cath same hospitalization: stable post-PCI anatomy with Stable Diag lesions (unlikely cause of resting angina, not good PCI targets), stable RCA lesion (non flow limiting).  . Diabetes  mellitus without complication ([place])    Takes insulin and metformin  . GERD (gastroesophageal reflux disease)    food related  . History of cancer chemotherapy    last in August 2014  . History of radiation therapy 06/23/2013-08/08/2013   62.4 gray to left breast  . HTN (hypertension)   . Hypercholesteremia   . LV dysfunction    a. EF 45-50% at time of STEMI 08/2012. b. Echo 04/2013: EF 45-50%, mild focal basal hypertrophy of septum, grade 1 d/d.  Marland Kitchen Myocardial infarction ([place]) 09/07/12  . Peripheral vision loss    left  . Renal artery stenosis (HCC)    a. 20% L RAS in 08/2012 by cath. b. Duplex 07/2012: >60% L RAS.  . Stroke, embolic ([place])    a. 26/9485 post cath.  . Vasovagal syncope    a. 04/2013 - echo stable compared to prior EF 45-50%, negative carotid duplex.    Past Surgical History:  Procedure Laterality Date  . BACK SURGERY     x3, lower back  . BREAST BIOPSY Left 12/03/2012   U/S Core- Malignant  . BREAST LUMPECTOMY Left 01/08/2013  . BREAST LUMPECTOMY WITH NEEDLE LOCALIZATION AND AXILLARY SENTINEL LYMPH NODE BX Left 01/08/2013   Procedure: LEFT BREAST WIRE GUIDED  LUMPECTOMY AND LEFT AXILLARY SENTINEL  NODE BX;  Surgeon: Rolm Bookbinder, MD;  Location: [facility]  OR;  Service: General;  Laterality: Left;  . CATARACT EXTRACTION Bilateral   . CORONARY ANGIOPLASTY WITH STENT PLACEMENT    . LEFT HEART CATHETERIZATION WITH CORONARY ANGIOGRAM N/A 09/07/2012   Procedure: LEFT HEART CATHETERIZATION WITH CORONARY ANGIOGRAM;  Surgeon: Troy Sine, MD;  Location: [facility] CATH LAB;  Service: Cardiovascular;  Laterality: N/A;  . LEFT HEART CATHETERIZATION WITH CORONARY ANGIOGRAM  09/09/2012   Procedure: LEFT HEART CATHETERIZATION WITH CORONARY ANGIOGRAM;  Surgeon: Leonie Man, MD;  Location: [facility] CATH LAB;  Service: Cardiovascular;;  . PERCUTANEOUS CORONARY STENT INTERVENTION (PCI-S) N/A 09/07/2012   Procedure: PERCUTANEOUS CORONARY STENT INTERVENTION (PCI-S);  Surgeon: Troy Sine, MD;   Location: [facility] CATH LAB;  Service: Cardiovascular;  Laterality: N/A;  . PORT-A-CATH REMOVAL N/A 10/14/2013   Procedure: REMOVAL PORT-A-CATH;  Surgeon: Rolm Bookbinder, MD;  Location: [facility] ORS;  Service: General;  Laterality: N/A;  . PORTACATH PLACEMENT Right 02/17/2013   Procedure: INSERTION PORT-A-CATH;  Surgeon: Rolm Bookbinder, MD;  Location: [place];  Service: General;  Laterality: Right;    Current Medications: Current Meds  Medication Sig  . aspirin 81 MG EC tablet Take 1 tablet (81 mg total) by mouth daily.  . carvedilol (COREG) 25 MG tablet TAKE 1 TABLET(25 MG) BY MOUTH TWICE DAILY WITH A MEAL  . ezetimibe (ZETIA) 10 MG tablet TAKE 1 TABLET(10 MG) BY MOUTH DAILY  . furosemide (LASIX) 40 MG tablet Take 1 tablet (40 mg total) by mouth daily.  Marland Kitchen glucose blood (ONETOUCH VERIO) test strip Use TID  . Insulin Glargine (LANTUS SOLOSTAR) 100 UNIT/ML Solostar Pen Inject 18 units [place] at bedtime.  . insulin lispro (HUMALOG) 100 UNIT/ML injection Inject 15-18 Units into the skin every morning.   . isosorbide mononitrate (IMDUR) 60 MG 24 hr tablet Take 1 tablet (60 mg total) by mouth daily. Please call and make an appt with Dr. Harrington Challenger for further refills.  . nitroGLYCERIN (NITROSTAT) 0.4 MG SL tablet Place 1 tablet (0.4 mg total) under the tongue every 5 (five) minutes as needed for chest pain.  . sacubitril-valsartan (ENTRESTO) 49-51 MG Take 1 tablet by mouth 2 (two) times daily.  Marland Kitchen spironolactone (ALDACTONE) 25 MG tablet Take 1 tablet (25 mg total) by mouth daily.  . [DISCONTINUED] carvedilol (COREG) 25 MG tablet TAKE 1 TABLET(25 MG) BY MOUTH TWICE DAILY WITH A MEAL  . [DISCONTINUED] ENTRESTO 49-51 MG TAKE 1 TABLET BY MOUTH TWICE DAILY  . [DISCONTINUED] ezetimibe (ZETIA) 10 MG tablet TAKE 1 TABLET(10 MG) BY MOUTH DAILY  . [DISCONTINUED] furosemide (LASIX) 40 MG tablet Take 1 tablet (40 mg total) by mouth daily.  . [DISCONTINUED] isosorbide mononitrate (IMDUR) 60 MG 24 hr tablet Take 1 tablet (60 mg  total) by mouth daily. Please call and make an appt with Dr. Harrington Challenger for further refills.  . [DISCONTINUED] nitroGLYCERIN (NITROSTAT) 0.4 MG SL tablet Place 0.4 mg under the tongue every 5 (five) minutes as needed for chest pain.  . [DISCONTINUED] spironolactone (ALDACTONE) 25 MG tablet Take 1 tablet (25 mg total) by mouth daily.     Allergies:   Patient has no known allergies.   Social History   Tobacco Use  . Smoking status: Never Smoker  . Smokeless tobacco: Never Used  Substance Use Topics  . Alcohol use: Yes    Comment: socially  . Drug use: No     Family Hx: The patient's family history includes Heart disease in her father; Hypertension in her father. There is no history of Cancer, Diabetes, Hyperlipidemia,  Kidney disease, or Stroke.  ROS:   Please see the history of present illness.    Review of Systems  Cardiovascular: Positive for leg swelling.  Respiratory: Positive for cough and shortness of breath.    All other systems reviewed and are negative.   EKGs/Labs/Other Test Reviewed:    EKG:  EKG is  ordered today.  The ekg ordered today demonstrates normal sinus rhythm, heart rate 93, left axis deviation, inferior Q waves, anterolateral Q waves, QTC 477, similar to prior tracing  Recent Labs: 12/23/2016: ALT 12; BUN 16; Creatinine, Ser 0.64; Hemoglobin 14.3; Magnesium 1.9; Platelets 163; Potassium 3.5; Sodium 135; TSH 1.496   Recent Lipid Panel Lab Results  Component Value Date/Time   CHOL 191 06/09/2016 02:20 PM   TRIG 114 06/09/2016 02:20 PM   HDL 61 06/09/2016 02:20 PM   CHOLHDL 3.1 06/09/2016 02:20 PM   [place] 107 06/09/2016 02:20 PM    Physical Exam:    VS:  BP 110/70   Pulse 96   Ht 5\' 5"  (1.651 m)   Wt 135 lb 1.9 oz (61.3 kg)   SpO2 97%   BMI 22.49 kg/m     Wt Readings from Last 3 Encounters:  12/12/17 135 lb 1.9 oz (61.3 kg)  12/24/16 143 lb 11.2 oz (65.2 kg)  11/08/16 144 lb (65.3 kg)     Physical Exam  Constitutional: She is oriented to  person, place, and time. She appears well-developed and well-nourished. No distress.  HENT:  Head: Normocephalic and atraumatic.  Eyes: No scleral icterus.  Neck: No JVD present.  Cardiovascular: Normal rate and regular rhythm.  No murmur heard. Pulmonary/Chest: Effort normal. She has no rales.  Abdominal: Soft.  Musculoskeletal: She exhibits no edema.  Neurological: She is alert and oriented to person, place, and time.  Skin: Skin is warm and dry.    ASSESSMENT & PLAN:    1.  Chronic combined systolic (congestive) and diastolic (congestive) heart failure (HCC) NYHA 2.  Status post recent admission to [facility] in January 2019 with flash pulmonary edema.  She has been adherent with her medications.  She denies heavy salt load.  Etiology of her flash pulmonary edema is not entirely clear.  Her blood pressure was significantly elevated upon presentation.  Question if this is secondary to acute respiratory distress versus the cause of her flash pulmonary edema.  She has not had an ischemic evaluation since 2016.  Her troponins were normal when she was hospitalized.  Her creatinine has remained normal.  Her blood pressure is fairly well controlled.  I do not think that she needs repeat renal artery Dopplers.  However, we should rule out the possibility of ischemia contributing.  She remains on a good medical regimen.  -Continue beta-blocker, nitrates, Entresto, spironolactone  -Given elevated heart rate, consider [place] follow-up  -Obtain follow-up echocardiogram  -Obtain Lexiscan Myoview  -Follow-up 3-4 weeks  -Continue low-salt diet  2.  Coronary artery disease History of myocardial infarction November 2013 treated with drug-eluting stent to the LAD.  Follow-up cardiac catheterization November 2013 did demonstrate some small vessel disease in the diagonals, moderate nonobstructive disease in the LAD beyond the stent and moderate nonobstructive disease in the RCA.  As noted, I am not  certain of the etiology for her flash pulmonary edema.  Obtain stress testing is noted.  Continue aspirin, ezetimibe, nitrates.  She is no longer on atorvastatin for unclear reasons.  Her atorvastatin will be resumed.  3.  Essential hypertension The  patient's blood pressure is controlled on her current regimen.  Continue current therapy.   4.  Renal artery stenosis (HCC)  She has a history of >60% left renal artery stenosis in 2015.  Her blood pressure is controlled.  Her creatinine is normal.  Therefore, I do not believe that renal artery stenosis is a cause for her flash pulmonary edema.  Continue aspirin, statin.  5.  Hyperlipidemia, unspecified hyperlipidemia type  Restart atorvastatin 80 mg daily.  Obtain lipids and LFTs in 3 months.  6.  Type 2 diabetes mellitus Follow-up with primary care.   Dispo:  Return in about 1 month (around 01/09/2018) for Close Follow Up, w/ Dr. Harrington Challenger, or Richardson Dopp, PA-C.   Total time spent with patient today 45 minutes. This includes reviewing records, evaluating the patient and coordinating care. Face-to-face time >50%.    Medication Adjustments/Labs and Tests Ordered: Current medicines are reviewed at length with the patient today.  Concerns regarding medicines are outlined above.  Tests Ordered: Orders Placed This Encounter  Procedures  . Lipid panel  . Hepatic function panel  . MYOCARDIAL PERFUSION IMAGING  . EKG 12-Lead  . ECHOCARDIOGRAM COMPLETE   Medication Changes: Meds ordered this encounter  Medications  . carvedilol (COREG) 25 MG tablet    Sig: TAKE 1 TABLET(25 MG) BY MOUTH TWICE DAILY WITH A MEAL    Dispense:  180 tablet    Refill:  1  . sacubitril-valsartan (ENTRESTO) 49-51 MG    Sig: Take 1 tablet by mouth 2 (two) times daily.    Dispense:  60 tablet    Refill:  7  . ezetimibe (ZETIA) 10 MG tablet    Sig: TAKE 1 TABLET(10 MG) BY MOUTH DAILY    Dispense:  90 tablet    Refill:  1  . furosemide (LASIX) 40 MG tablet    Sig:  Take 1 tablet (40 mg total) by mouth daily.    Dispense:  90 tablet    Refill:  3  . spironolactone (ALDACTONE) 25 MG tablet    Sig: Take 1 tablet (25 mg total) by mouth daily.    Dispense:  90 tablet    Refill:  3  . nitroGLYCERIN (NITROSTAT) 0.4 MG SL tablet    Sig: Place 1 tablet (0.4 mg total) under the tongue every 5 (five) minutes as needed for chest pain.    Dispense:  25 tablet    Refill:  3  . isosorbide mononitrate (IMDUR) 60 MG 24 hr tablet    Sig: Take 1 tablet (60 mg total) by mouth daily. Please call and make an appt with Dr. Harrington Challenger for further refills.    Dispense:  30 tablet    Refill:  0  . atorvastatin (LIPITOR) 80 MG tablet    Sig: Take 1 tablet (80 mg total) by mouth daily.    Dispense:  30 tablet    Refill:  [address] Richardson Dopp, [place]  12/12/2017 5:31 PM    [facility] [place], [place], [place]  [address] Phone: [phone]; Fax: [phone]

## 2017-12-12 NOTE — Patient Instructions (Addendum)
Medication Instructions:  Your physician has recommended you make the following change in your medication:  1.  RESTART  Atorvastatin 80 mg daily   Labwork: 3 MONTHS:  FASTING LIPID & LFT  Testing/Procedures: Your physician has requested that you have an echocardiogram. Echocardiography is a painless test that uses sound waves to create images of your heart. It provides your doctor with information about the size and shape of your heart and how well your heart's chambers and valves are working. This procedure takes approximately one hour. There are no restrictions for this procedure.   Your physician has requested that you have a lexiscan myoview. For further information please visit [email or web address]. Please follow instruction sheet, as given.     Follow-Up: Your physician recommends that you schedule a follow-up appointment in: 01/04/18 ARRIVE AT 9:30 FOR A 9:45 APPT TO SEE SCOTT WEAVER, PA-C    Any Other Special Instructions Will Be Listed Below (If Applicable). Echocardiogram An echocardiogram, or echocardiography, uses sound waves (ultrasound) to produce an image of your heart. The echocardiogram is simple, painless, obtained within a short period of time, and offers valuable information to your health care provider. The images from an echocardiogram can provide information such as:  Evidence of coronary artery disease (CAD).  Heart size.  Heart muscle function.  Heart valve function.  Aneurysm detection.  Evidence of a past heart attack.  Fluid buildup around the heart.  Heart muscle thickening.  Assess heart valve function.  Tell a health care provider about:  Any allergies you have.  All medicines you are taking, including vitamins, herbs, eye drops, creams, and over-the-counter medicines.  Any problems you or family members have had with anesthetic medicines.  Any blood disorders you have.  Any surgeries you have had.  Any medical conditions you  have.  Whether you are pregnant or may be pregnant. What happens before the procedure? No special preparation is needed. Eat and drink normally. What happens during the procedure?  In order to produce an image of your heart, gel will be applied to your chest and a wand-like tool (transducer) will be moved over your chest. The gel will help transmit the sound waves from the transducer. The sound waves will harmlessly bounce off your heart to allow the heart images to be captured in real-time motion. These images will then be recorded.  You may need an IV to receive a medicine that improves the quality of the pictures. What happens after the procedure? You may return to your normal schedule including diet, activities, and medicines, unless your health care provider tells you otherwise. This information is not intended to replace advice given to you by your health care provider. Make sure you discuss any questions you have with your health care provider. Document Released: 10/13/2000 Document Revised: 06/03/2016 Document Reviewed: 06/23/2013 Elsevier Interactive Patient Education  2017 [employer].    If you need a refill on your cardiac medications before your next appointment, please call your pharmacy.

## 2017-12-13 ENCOUNTER — Other Ambulatory Visit: Payer: Self-pay | Admitting: *Deleted

## 2017-12-13 MED ORDER — SPIRONOLACTONE 25 MG PO TABS: 25.0000 mg | ORAL_TABLET | Freq: Every day | ORAL | 3 refills | Status: DC

## 2017-12-13 MED ORDER — FUROSEMIDE 40 MG PO TABS: 40.0000 mg | ORAL_TABLET | Freq: Every day | ORAL | 11 refills | Status: DC

## 2017-12-13 MED ORDER — EZETIMIBE 10 MG PO TABS: ORAL_TABLET | ORAL | 11 refills | Status: DC

## 2017-12-13 MED ORDER — CARVEDILOL 25 MG PO TABS: ORAL_TABLET | ORAL | 11 refills | Status: DC

## 2017-12-13 MED ORDER — SACUBITRIL-VALSARTAN 49-51 MG PO TABS: 1.0000 | ORAL_TABLET | Freq: Two times a day (BID) | ORAL | 11 refills | Status: DC

## 2017-12-17 ENCOUNTER — Telehealth (HOSPITAL_COMMUNITY): Payer: Self-pay | Admitting: *Deleted

## 2017-12-17 NOTE — Telephone Encounter (Signed)
Patient given detailed instructions per Myocardial Perfusion Study Information Sheet for the test on 12/19/17. Patient notified to arrive 15 minutes early and that it is imperative to arrive on time for appointment to keep from having the test rescheduled.  If you need to cancel or reschedule your appointment, please call the office within 24 hours of your appointment. . Patient verbalized understanding. Kirstie Peri

## 2017-12-19 ENCOUNTER — Ambulatory Visit (HOSPITAL_BASED_OUTPATIENT_CLINIC_OR_DEPARTMENT_OTHER): Payer: Medicare Other

## 2017-12-19 ENCOUNTER — Ambulatory Visit (HOSPITAL_COMMUNITY): Payer: Medicare Other | Attending: Cardiology

## 2017-12-19 ENCOUNTER — Other Ambulatory Visit: Payer: Self-pay

## 2017-12-19 DIAGNOSIS — E119 Type 2 diabetes mellitus without complications: Secondary | ICD-10-CM | POA: Diagnosis not present

## 2017-12-19 DIAGNOSIS — I110 Hypertensive heart disease with heart failure: Secondary | ICD-10-CM | POA: Insufficient documentation

## 2017-12-19 DIAGNOSIS — I5042 Chronic combined systolic (congestive) and diastolic (congestive) heart failure: Secondary | ICD-10-CM | POA: Insufficient documentation

## 2017-12-19 DIAGNOSIS — I2510 Atherosclerotic heart disease of native coronary artery without angina pectoris: Secondary | ICD-10-CM | POA: Diagnosis not present

## 2017-12-19 DIAGNOSIS — E785 Hyperlipidemia, unspecified: Secondary | ICD-10-CM | POA: Diagnosis not present

## 2017-12-19 DIAGNOSIS — I255 Ischemic cardiomyopathy: Secondary | ICD-10-CM | POA: Diagnosis not present

## 2017-12-19 LAB — MYOCARDIAL PERFUSION IMAGING
LV dias vol: 118 mL (ref 46–106)
LV sys vol: 73 mL
Peak HR: 82 {beats}/min
RATE: 0.38
Rest HR: 76 {beats}/min
SDS: 7
SRS: 27
SSS: 34
TID: 1.11

## 2017-12-19 MED ORDER — TECHNETIUM TC 99M TETROFOSMIN IV KIT
10.7000 | PACK | Freq: Once | INTRAVENOUS | Status: AC | PRN
  Administered 2017-12-19: 10.7 via INTRAVENOUS
  Filled 2017-12-19: qty 11

## 2017-12-19 MED ORDER — TECHNETIUM TC 99M TETROFOSMIN IV KIT
32.6000 | PACK | Freq: Once | INTRAVENOUS | Status: AC | PRN
  Administered 2017-12-19: 32.6 via INTRAVENOUS
  Filled 2017-12-19: qty 33

## 2017-12-19 MED ORDER — PERFLUTREN LIPID MICROSPHERE
1.0000 mL | INTRAVENOUS | Status: AC | PRN
  Administered 2017-12-19: 2 mL via INTRAVENOUS

## 2017-12-19 MED ORDER — REGADENOSON 0.4 MG/5ML IV SOLN
0.4000 mg | Freq: Once | INTRAVENOUS | Status: AC
  Administered 2017-12-19: 0.4 mg via INTRAVENOUS

## 2017-12-25 ENCOUNTER — Encounter: Payer: Self-pay | Admitting: Physician Assistant

## 2017-12-28 ENCOUNTER — Telehealth: Payer: Self-pay | Admitting: *Deleted

## 2017-12-28 NOTE — Telephone Encounter (Signed)
-----   Message from Liliane Shi, [place] sent at 12/28/2017  2:21 PM EST ----- Please call the patient. Dr. Harrington Challenger reviewed her echo with one of our other cardiologists. The echo appears to be unchanged. No further testing needed. Continue current medications and follow up as planned.  Richardson Dopp, PA-C    12/28/2017 2:20 PM

## 2017-12-28 NOTE — Telephone Encounter (Signed)
Pt has been notified Dr. Harrington Challenger also reviewed echo who states no significant change. Pt aware no further testing needed. Pt thanked me for my call.

## 2018-01-04 ENCOUNTER — Ambulatory Visit: Payer: Medicare Other | Admitting: Physician Assistant

## 2018-01-22 ENCOUNTER — Ambulatory Visit (INDEPENDENT_AMBULATORY_CARE_PROVIDER_SITE_OTHER): Payer: Medicare Other | Admitting: Physician Assistant

## 2018-01-22 ENCOUNTER — Telehealth: Payer: Self-pay | Admitting: Physician Assistant

## 2018-01-22 ENCOUNTER — Encounter: Payer: Self-pay | Admitting: Physician Assistant

## 2018-01-22 VITALS — BP 144/80 | HR 82 | Ht 65.0 in | Wt 132.2 lb

## 2018-01-22 DIAGNOSIS — I5042 Chronic combined systolic (congestive) and diastolic (congestive) heart failure: Secondary | ICD-10-CM

## 2018-01-22 DIAGNOSIS — I701 Atherosclerosis of renal artery: Secondary | ICD-10-CM | POA: Diagnosis not present

## 2018-01-22 DIAGNOSIS — I110 Hypertensive heart disease with heart failure: Secondary | ICD-10-CM | POA: Diagnosis not present

## 2018-01-22 DIAGNOSIS — I2510 Atherosclerotic heart disease of native coronary artery without angina pectoris: Secondary | ICD-10-CM

## 2018-01-22 LAB — BASIC METABOLIC PANEL
BUN/Creatinine Ratio: 22 (ref 12–28)
BUN: 18 mg/dL (ref 8–27)
CO2: 23 mmol/L (ref 20–29)
Calcium: 9.6 mg/dL (ref 8.7–10.3)
Chloride: 91 mmol/L — ABNORMAL LOW (ref 96–106)
Creatinine, Ser: 0.83 mg/dL (ref 0.57–1.00)
GFR calc Af Amer: 84 mL/min/{1.73_m2} (ref >59)
GFR calc non Af Amer: 73 mL/min/{1.73_m2} (ref >59)
Glucose: 580 mg/dL (ref 65–99)
Potassium: 5 mmol/L (ref 3.5–5.2)
Sodium: 133 mmol/L — ABNORMAL LOW (ref 134–144)

## 2018-01-22 NOTE — Telephone Encounter (Addendum)
[facility] paged after hours answering service - glucose 580 which was verified per their report. I called pt at home - had OV earlier today and overall doing well, asymptomatic. Her insulin is managed by PCP. I advised she contact their office ASAP to discuss further action on her insulin dosing tonight given this value. She verbalized understanding and gratitude.   Sefora Tietje PA-C

## 2018-01-22 NOTE — Patient Instructions (Addendum)
Medication Instructions:  1. Your physician recommends that you continue on your current medications as directed. Please refer to the Current Medication list given to you today.   Labwork: TODAY BMET  Testing/Procedures: NONE ORDERED TODAY  Follow-Up: DR. Harrington Challenger ON April 26, 2018 @ 1:20 PM   Any Other Special Instructions Will Be Listed Below (If Applicable). CALL IF YOUR BLOOD PRESSURE IS 135/85 OR HIGHER [phone]  YOU HAVE BEEN GIVEN ASSISTANCE PAPERWORK FOR ENTRESTO. ONCE YOU FINISH FILLING OUT THE PAPERWORK, PLEASE RETURN TO OUR OFFICE SO THAT WE MAY SEND IN  If you need a refill on your cardiac medications before your next appointment, please call your pharmacy.

## 2018-01-22 NOTE — Progress Notes (Signed)
Cardiology Office Note:    Date:  01/22/2018   ID:  JACOBY Forbes, DOB [birth year], MRN [id number]  PCP:  Janith Lima, MD  Cardiologist:  Dorris Carnes, MD   Referring MD: Janith Lima, MD   Chief Complaint  Patient presents with  . Follow-up    Heart failure    History of Present Illness:    Marisa Forbes is a 67 y.o. female with coronary artery disease status post ST elevation myocardial infarction November 2013 treated with drug-eluting stent to the LAD and angioplasty to the distal LAD, post catheterization stroke, carotid artery stenosis, renal artery stenosis, breast cancer, hypertension, diabetes, systolic heart failure.  EF was previously 45-50 in 2014.  During an admission in 2016, she was noted to have an EF of 20-25%.  Cardiac stress MRI demonstrated scar in the entire anterior, mid anteroseptal and apical septal walls with minimal ischemia in the mid inferoseptal wall.  Findings were felt to be due to hypertension, prior chemotherapy and cardiac catheterization was not recommended.  Follow-up echo in September 2016 demonstrated improved LV function with EF 40-45.  She was admitted to [facility] in 12/17 with decompensated heart failure/flash pulmonary edema.  EF was 30-35.  Cardiac MRI in January 2018 demonstrated EF 42 with late gadolinium uptake in the anterior, anteroseptal and apical anterior walls with poor chance of recovery if revascularized.  She had an elevated troponin during an admission for pneumonia in December 2018 felt to be related to demand ischemia.  EF was 40-45 and echocardiogram.    She was most recently seen after admission to [facility] in January 2019 with decompensated heart failure and acute pulmonary edema in the setting of hypertensive emergency.  A follow-up echocardiogram demonstrated EF 45-50 with grade 1 diastolic dysfunction.  There was some concern for apical pseudoaneurysm.  However, her echocardiogram was reviewed by Dr. Harrington Challenger and compared with  prior studies as well as her prior cardiac MRIs.  There was no significant change found and no further evaluation was felt to be necessary.  A nuclear stress test demonstrated a large area of scar with very mild peri-infarct ischemia and EF 38%.  This was also reviewed with Dr. Harrington Challenger who felt that no further ischemic evaluation was necessary.  Marisa Forbes returns for follow-up.  She is here alone.  Since last seen, she denies chest discomfort, significant shortness of breath, PND, edema, syncope.  Prior CV studies:   The following studies were reviewed today:  Nuclear stress test 12/19/17 EF 38, inferior/inferolateral/anterior/anteroseptal/apical scar with very mild peri-infarct ischemia in the basal anterolateral myocardium  Echo 12/19/17 Moderate focal basal and mild concentric hypertrophy, EF 45-50, apical akinesis, anteroseptal akinesis, grade 1 diastolic dysfunction, MAC; high suspicion for apical pseudoaneurysm >> Echo reviewed by Dr. Harrington Challenger and compared with prior echocardiograms and MRIs >> no significant change  Echo 12/23/16 Mild LVH, EF 40-45, anteroseptal and apical hypokinesis, grade 1 diastolic dysfunction, mild LAE, trivial pericardial effusion  Cardiac MRI 11/22/16 IMPRESSION: 1. Normal left ventricular size with moderate concentric left ventricular hypertrophy and mildly to moderately decreased systolic function (LVEF = 42%).  There is akinesis of the apical septal, anterior, mid anteroseptal and hypokinesis of the mid and basal anterior walls.  There is late gadolinium enhancement in the basal anterior, mid anterior, anteroseptal, and apical anterior and septal walls with 75-100% transmurality and poor chance of recovery if revascularized.  Collectivelly, these findings are consistent with ischemic cardiomyopathy. There is no indication for an ICD placement.  2. Normal right ventricular size, thickness and systolic function (RVEF = 55%) with no regional wall motion  abnormalities.  3. Normal biatrial size.  4. Mild mitral regurgitation.  Limited echo 07/15/15 Mild LVH, EF 40-45, anteroseptal akinesis  Stress cardiac MRI 04/14/15 IMPRESSION: 1. Normal left ventricular size with moderate concentric hypertrophy and severely decreased systolic function (LVEF = 26%). There is akinesis of the basal anterior, mid anterior, anteroseptal and apical anterior and septal walls.  2. Normal right ventricular size, thickness and low normal systolic function (RVEF = 42%).  3. Stress test indicates that there is a scar in the entire anterior, mid anteroseptal and apical septal walls with minimal ischemia in the mid inferoseptal wall.  4. Late gadolinium images show a scar a scar in the entire anterior, mid anteroseptal and apical septal walls with poor recovery if Revascularized.  Echo 04/13/15 Moderate concentric LVH, EF 25-30, diffuse HK, anterior akinesis  Carotid US 04/12/15 Findings suggest 1-39% internal carotid artery stenosis bilaterally.   Renal artery [place] 08/19/14 L RA >60%  Nuclear stress test 10/18/12 Mid to distal and apical LAD territory scar, no ischemia, EF 42  Cardiac catheterization 09/07/12 LAD stent patent, diffuse 40-70 beyond stent; D1 mid 70-80 (small vessel); tiny D2 with shelflike lesion thought to be a small dissection flap LCx irregularities RCA mid 50, distal 70  Past Medical History:  Diagnosis Date  . Arthritis    back  . Balance problems    due to stroke  . Breast cancer ([place]) 12/03/12   a. 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 S/p chemo with CMF and radiation.  . CHF (congestive heart failure) ([place])    On Monday she said her cardiologist was concerned for possible CHF, but is now improving-per pt.(07/26/15)  . Coronary artery disease    a. STEMI 08/2012: s/p DES to LAD, PTCA to downstream LAD. b. Relook cath same hospitalization: stable post-PCI anatomy with Stable Diag  lesions (unlikely cause of resting angina, not good PCI targets), stable RCA lesion (non flow limiting).  . Diabetes mellitus without complication ([place])    Takes insulin and metformin  . GERD (gastroesophageal reflux disease)    food related  . History of cancer chemotherapy    last in August 2014  . 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 History of radiation therapy 06/23/2013-08/08/2013   62.4 gray to left breast  . HTN (hypertension)   . Hypercholesteremia   . LV dysfunction    a. EF 45-50% at time of STEMI 08/2012. b. Echo 04/2013: EF 45-50%, mild focal basal hypertrophy of septum, grade 1 d/d.  Marland Kitchen Myocardial infarction ([place]) 09/07/12  . Peripheral vision loss    left  . Renal artery stenosis (HCC)    a. 20% L RAS in 08/2012 by cath. b. Duplex 07/2012: >60% L RAS.  . Stroke, embolic ([place])    a. 19/5093 post cath.  . Vasovagal syncope    a. 04/2013 - echo stable compared to prior EF 45-50%, negative carotid duplex.   Surgical Hx: The patient  has a past surgical history that includes Coronary angioplasty with stent; Breast lumpectomy with needle localization and axillary sentinel lymph node bx (Left, 01/08/2013); Portacath placement (Right, 02/17/2013); Back surgery; Cataract extraction (Bilateral); Port-a-cath removal (N/A, 10/14/2013); left heart catheterization with coronary angiogram (N/A, 09/07/2012); percutaneous coronary stent  intervention (pci-s) (N/A, 09/07/2012); left heart catheterization with coronary angiogram (09/09/2012); Breast lumpectomy (Left, 01/08/2013); and Breast biopsy (Left, 12/03/2012).   Current Medications: Current Meds  Medication Sig  . aspirin 81 MG EC tablet Take 1 tablet (81 mg total) by mouth daily.  Marland Kitchen atorvastatin (LIPITOR) 80 MG tablet Take 1 tablet (80 mg total) by mouth daily.  .  carvedilol (COREG) 25 MG tablet TAKE 1 TABLET(25 MG) BY MOUTH TWICE DAILY WITH A MEAL  . ezetimibe (ZETIA) 10 MG tablet TAKE 1 TABLET(10 MG) BY MOUTH DAILY  . furosemide (LASIX) 40 MG tablet Take 1 tablet (40 mg total) by mouth daily.  Marland Kitchen glucose blood (ONETOUCH VERIO) test strip Use TID  . Insulin Glargine (LANTUS SOLOSTAR) 100 UNIT/ML Solostar Pen Inject 18 units Luis Lopez at bedtime.  . insulin lispro (HUMALOG) 100 UNIT/ML injection Inject 15-18 Units into the skin every morning.   . isosorbide mononitrate (IMDUR) 60 MG 24 hr tablet Take 1 tablet (60 mg total) by mouth daily. Please call and make an appt with Dr. Harrington Challenger for further refills.  . nitroGLYCERIN (NITROSTAT) 0.4 MG SL tablet Place 1 tablet (0.4 mg total) under the tongue every 5 (five) minutes as needed for chest pain.  . sacubitril-valsartan (ENTRESTO) 49-51 MG Take 1 tablet by mouth 2 (two) times daily.  Marland Kitchen spironolactone (ALDACTONE) 25 MG tablet Take 1 tablet (25 mg total) by mouth daily.     Allergies:   Patient has no known allergies.   Social History   Tobacco Use  . Smoking status: Never Smoker  . Smokeless tobacco: Never Used  Substance Use Topics  . Alcohol use: Yes    Comment: socially  . Drug use: No     Family Hx: The patient's family history includes Heart disease in her father; Hypertension in her father. There is no history of Cancer, Diabetes, Hyperlipidemia, Kidney disease, or Stroke.  ROS:   Please see the history of present illness.    ROS All other systems reviewed and are negative.   EKGs/Labs/Other Test Reviewed:    EKG:  EKG is not ordered today.    Recent Labs: No results found for requested labs within last 8760 hours.   Recent Lipid Panel Lab Results  Component Value Date/Time   CHOL 191 06/09/2016 02:20 PM   TRIG 114 06/09/2016 02:20 PM   HDL 61 06/09/2016 02:20 PM   CHOLHDL 3.1 06/09/2016 02:20 PM   [place] 107 06/09/2016 02:20 PM    Physical Exam:    VS:  BP (!) 144/80   Pulse 82    Ht _0  (1.651 m)   Wt 132 lb 3.2 oz (60 kg)   SpO2 92%   BMI 22.00 kg/m     Wt Readings from Last 3 Encounters:  01/22/18 132 lb 3.2 oz (60 kg)  12/19/17 135 lb (61.2 kg)  12/12/17 135 lb 1.9 oz (61.3 kg)     Physical Exam  Constitutional: She is oriented to person, place, and time. She appears well-developed and well-nourished. No distress.  HENT:  Head: Normocephalic and atraumatic.  Neck: No JVD present.  Cardiovascular: Normal rate and regular rhythm.  No murmur heard. Pulmonary/Chest: Effort normal.  Abdominal: Soft.  Musculoskeletal: She exhibits no edema.  Neurological: She is alert and oriented to person, place, and time.  Skin: Skin is warm and dry.    ASSESSMENT & PLAN:    #1.  Chronic combined systolic (congestive) and diastolic (congestive) heart failure (HCC) EF 45-50 by recent echocardiogram  with mild diastolic dysfunction.  She is NYHA 2.  As noted, she was recently evaluated for admission to [facility] in January 2019 with flash pulmonary edema.  Stress testing did not demonstrate any significant ischemia.  Echocardiogram did not demonstrate any significant change in her ejection fraction.  Volume overall appears stable.  Continue current dose of beta-blocker, spironolactone, Entresto, nitrates.  We discussed the importance of continued restriction of salt as well as continued strict control of her blood pressure.  #2.  Coronary artery disease involving native coronary artery of native heart without angina pectoris Prior myocardial infarction in November 2013 treated with drug-eluting stent to the LAD.  Recent stress testing with extensive scar but no ischemia.  She denies anginal symptoms.  Continue aspirin, statin, beta-blocker, ARB.  #3.  Hypertensive heart disease with chronic combined systolic and diastolic congestive heart failure (HCC) Blood pressure somewhat borderline today.  However, she has not taken any medications yet.  I have asked her to continue to  monitor blood pressure at home.  Consider adjusting isosorbide versus Entresto if blood pressure remains above target.  #4.  Renal artery stenosis ([place]) Renal arterial Dopplers in 2015 demonstrated greater than 60% left renal artery stenosis.  Recent creatinines have been normal.  Her blood pressure has been well controlled.  I have recommended obtaining a repeat BMET today.  If her creatinine remains stable, no further testing indicated.  If she has worsening creatinine, she will need follow-up renal arterial Dopplers.   Dispo:  Return in about 3 months (around 04/24/2018) for Routine Follow Up, w/ Dr. Harrington Challenger.   Medication Adjustments/Labs and Tests Ordered: Current medicines are reviewed at length with the patient today.  Concerns regarding medicines are outlined above.  Tests Ordered: Orders Placed This Encounter  Procedures  . Basic Metabolic Panel (BMET)   Medication Changes: No orders of the defined types were placed in this encounter.   Signed, Richardson Dopp, PA-C  01/22/2018 3:56 PM    [facility] [address], [place], [place]  [address] Phone: [phone]; Fax: [phone]

## 2018-01-23 ENCOUNTER — Telehealth: Payer: Self-pay | Admitting: Internal Medicine

## 2018-01-23 ENCOUNTER — Telehealth: Payer: Self-pay | Admitting: *Deleted

## 2018-01-23 NOTE — Telephone Encounter (Signed)
-----   Message from Liliane Shi, PA-C sent at 01/23/2018  7:55 AM EDT ----- Renal function and potassium normal. Her sugar is out of control. She needs to check it this AM and call her PCP if > 300. Otherwise, Continue current medications and follow up as planned.  Richardson Dopp, PA-C    01/23/2018 7:54 AM

## 2018-01-23 NOTE — Telephone Encounter (Signed)
Pt has been notified of lab results. Pt made aware of blood sugars out of control per Richardson Dopp, [place] . I asked the pt if she had checked her blood sugar yet this morning, pt answered no. I asked the pt to please check her blood sugar and call me back so that we may make sure her blood sugar is come back down. Pt has been made aware she needs to call PCP today for follow up on her diabetes. Pt is agreeable to plan of care and thanked me for the call. I will forward results to PCP Dr. Scarlette Calico.

## 2018-01-23 NOTE — Telephone Encounter (Signed)
Ptcb as instructed with her glucose level for this morning, pt reports glucose was 218 today. I advised pt to please contact PCP as this is still too high. Pt is agreeable to plan of care.

## 2018-01-23 NOTE — Telephone Encounter (Signed)
Follow Up:     Returning your cal from this morning.

## 2018-01-25 ENCOUNTER — Other Ambulatory Visit: Payer: Self-pay | Admitting: Internal Medicine

## 2018-01-25 DIAGNOSIS — Z139 Encounter for screening, unspecified: Secondary | ICD-10-CM

## 2018-04-24 ENCOUNTER — Other Ambulatory Visit: Payer: Self-pay | Admitting: Internal Medicine

## 2018-04-24 DIAGNOSIS — Z853 Personal history of malignant neoplasm of breast: Secondary | ICD-10-CM

## 2018-04-26 ENCOUNTER — Encounter: Payer: Self-pay | Admitting: Internal Medicine

## 2018-04-26 ENCOUNTER — Ambulatory Visit (INDEPENDENT_AMBULATORY_CARE_PROVIDER_SITE_OTHER): Payer: Medicare Other | Admitting: Internal Medicine

## 2018-04-26 VITALS — BP 120/90 | HR 102 | Ht 65.0 in | Wt 133.0 lb

## 2018-04-26 DIAGNOSIS — I701 Atherosclerosis of renal artery: Secondary | ICD-10-CM

## 2018-04-26 DIAGNOSIS — I5042 Chronic combined systolic (congestive) and diastolic (congestive) heart failure: Secondary | ICD-10-CM

## 2018-04-26 DIAGNOSIS — E1142 Type 2 diabetes mellitus with diabetic polyneuropathy: Secondary | ICD-10-CM

## 2018-04-26 DIAGNOSIS — I5022 Chronic systolic (congestive) heart failure: Secondary | ICD-10-CM

## 2018-04-26 DIAGNOSIS — Z794 Long term (current) use of insulin: Secondary | ICD-10-CM

## 2018-04-26 DIAGNOSIS — I10 Essential (primary) hypertension: Secondary | ICD-10-CM | POA: Diagnosis not present

## 2018-04-26 DIAGNOSIS — R079 Chest pain, unspecified: Secondary | ICD-10-CM

## 2018-04-26 DIAGNOSIS — I110 Hypertensive heart disease with heart failure: Secondary | ICD-10-CM | POA: Diagnosis not present

## 2018-04-26 DIAGNOSIS — I4729 Other ventricular tachycardia: Secondary | ICD-10-CM

## 2018-04-26 DIAGNOSIS — E785 Hyperlipidemia, unspecified: Secondary | ICD-10-CM

## 2018-04-26 DIAGNOSIS — I472 Ventricular tachycardia: Secondary | ICD-10-CM

## 2018-04-26 MED ORDER — SPIRONOLACTONE 25 MG PO TABS: 25.0000 mg | ORAL_TABLET | Freq: Every day | ORAL | 11 refills | Status: DC

## 2018-04-26 MED ORDER — CARVEDILOL 25 MG PO TABS: ORAL_TABLET | ORAL | 11 refills | Status: DC

## 2018-04-26 MED ORDER — SACUBITRIL-VALSARTAN 49-51 MG PO TABS: 1.0000 | ORAL_TABLET | Freq: Two times a day (BID) | ORAL | 11 refills | Status: DC

## 2018-04-26 MED ORDER — ISOSORBIDE MONONITRATE ER 60 MG PO TB24
60.0000 mg | ORAL_TABLET | Freq: Every day | ORAL | 11 refills | Status: DC
Start: 2018-04-26 — End: 2019-05-14

## 2018-04-26 MED ORDER — FUROSEMIDE 40 MG PO TABS: 40.0000 mg | ORAL_TABLET | Freq: Every day | ORAL | 11 refills | Status: DC

## 2018-04-26 MED ORDER — ATORVASTATIN CALCIUM 80 MG PO TABS: 80.0000 mg | ORAL_TABLET | Freq: Every day | ORAL | 11 refills | Status: DC

## 2018-04-26 MED ORDER — NITROGLYCERIN 0.4 MG SL SUBL: 0.4000 mg | SUBLINGUAL_TABLET | SUBLINGUAL | 11 refills | Status: AC | PRN

## 2018-04-26 MED ORDER — EZETIMIBE 10 MG PO TABS: ORAL_TABLET | ORAL | 11 refills | Status: DC

## 2018-04-26 NOTE — Progress Notes (Signed)
Cardiology Office Note   Date:  04/26/2018   ID:  [name], DOB 1951/01/14, MRN [id number]  PCP:  Patient, No Pcp Per  Cardiologist:   Dorris Carnes, MD   No chief complaint on file.  F/U of CAD      History of Present Illness: Marisa Forbes is a 67 y.o. female with a history of HTN, breast CA, DM, CAD (STEMI 2013 with DES to LAD and PTCA to distal LAD)  and embolic CVA.  Pt also with RAS.   At one point  LVEF 20 to 25%  Admitted iwht ? Palsy vs TIA in June 2016   Stess MRI showed scar and minimal ischemia     F/U echo 40 to 45%   Admited Dec 2017 to [facility] with decompensated CHF   Echo LVEF 30 to 35%  NSVT    Cardiac MR in Jan 2018:   Sar in the anteior, anteroseptal, apical walls     Admitted in Jan 2019 to [place] with CHF, hypertensive urgency   BP 220 on arrival She was sen by Kathleen Argue in Feb    Echo LVEF 45 to 50%  With akinesis of apex, anteroseptal wall    Myovue  Scan in Feb sowed scar but no ischemia   Plan for medical Rx    The pt says she has been feeling good   No SOB    No Chest tightness Wt down Now on medicare   Entresto very expensive       Outpatient Medications Prior to Visit  Medication Sig Dispense Refill  . aspirin 81 MG EC tablet Take 1 tablet (81 mg total) by mouth daily. 30 tablet 12  . glucose blood (ONETOUCH VERIO) test strip Use TID 100 each 12  . Insulin Glargine (LANTUS SOLOSTAR) 100 UNIT/ML Solostar Pen Inject 18 units Rew at bedtime. 1 pen 3  . insulin lispro (HUMALOG) 100 UNIT/ML injection Inject 15-18 Units into the skin every morning.     . carvedilol (COREG) 25 MG tablet TAKE 1 TABLET(25 MG) BY MOUTH TWICE DAILY WITH A MEAL 60 tablet 11  . ezetimibe (ZETIA) 10 MG tablet TAKE 1 TABLET(10 MG) BY MOUTH DAILY 30 tablet 11  . furosemide (LASIX) 40 MG tablet Take 1 tablet (40 mg total) by mouth daily. 30 tablet 11  . isosorbide mononitrate (IMDUR) 60 MG 24 hr tablet Take 1 tablet (60 mg total) by mouth daily. Please call and make an appt  with Dr. Harrington Challenger for further refills. 30 tablet 0  . nitroGLYCERIN (NITROSTAT) 0.4 MG SL tablet Place 1 tablet (0.4 mg total) under the tongue every 5 (five) minutes as needed for chest pain. 25 tablet 3  . sacubitril-valsartan (ENTRESTO) 49-51 MG Take 1 tablet by mouth 2 (two) times daily. 60 tablet 11  . spironolactone (ALDACTONE) 25 MG tablet Take 1 tablet (25 mg total) by mouth daily. 90 tablet 3  . atorvastatin (LIPITOR) 80 MG tablet Take 1 tablet (80 mg total) by mouth daily. 30 tablet 11   No facility-administered medications prior to visit.      Allergies:   Patient has no known allergies.   Past Medical History:  Diagnosis Date  . Arthritis    back  . Balance problems    due to stroke  . Breast cancer ([place]) 12/03/12   a. 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 S/p chemo with CMF and radiation.  . CHF (congestive heart failure) ([place])  On Monday she said her cardiologist was concerned for possible CHF, but is now improving-per pt.(07/26/15)  . Coronary artery disease    a. STEMI 08/2012: s/p DES to LAD, PTCA to downstream LAD. b. Relook cath same hospitalization: stable post-PCI anatomy with Stable Diag lesions (unlikely cause of resting angina, not good PCI targets), stable RCA lesion (non flow limiting).  . Diabetes mellitus without complication ([place])    Takes insulin and metformin  . GERD (gastroesophageal reflux disease)    food related  . History of cancer chemotherapy    last in August 2014  . 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 History of radiation therapy 06/23/2013-08/08/2013   62.4 gray to left breast  . HTN (hypertension)   . Hypercholesteremia   . LV dysfunction    a. EF 45-50% at time of STEMI 08/2012. b. Echo 04/2013: EF 45-50%, mild focal basal hypertrophy  of septum, grade 1 d/d.  Marland Kitchen Myocardial infarction ([place]) 09/07/12  . Peripheral vision loss    left  . Renal artery stenosis (HCC)    a. 20% L RAS in 08/2012 by cath. b. Duplex 07/2012: >60% L RAS.  . Stroke, embolic ([place])    a. 24/4010 post cath.  . Vasovagal syncope    a. 04/2013 - echo stable compared to prior EF 45-50%, negative carotid duplex.    Past Surgical History:  Procedure Laterality Date  . BACK SURGERY     x3, lower back  . BREAST BIOPSY Left 12/03/2012   U/S Core- Malignant  . BREAST LUMPECTOMY Left 01/08/2013  . BREAST LUMPECTOMY WITH NEEDLE LOCALIZATION AND AXILLARY SENTINEL LYMPH NODE BX Left 01/08/2013   Procedure: LEFT BREAST WIRE GUIDED  LUMPECTOMY AND LEFT AXILLARY SENTINEL  NODE BX;  Surgeon: Rolm Bookbinder, MD;  Location: [facility];  Service: General;  Laterality: Left;  . CATARACT EXTRACTION Bilateral   . CORONARY ANGIOPLASTY WITH STENT PLACEMENT    . LEFT HEART CATHETERIZATION WITH CORONARY ANGIOGRAM N/A 09/07/2012   Procedure: LEFT HEART CATHETERIZATION WITH CORONARY ANGIOGRAM;  Surgeon: Troy Sine, MD;  Location: [facility] CATH LAB;  Service: Cardiovascular;  Laterality: N/A;  . LEFT HEART CATHETERIZATION WITH CORONARY ANGIOGRAM  09/09/2012   Procedure: LEFT HEART CATHETERIZATION WITH CORONARY ANGIOGRAM;  Surgeon: Leonie Man, MD;  Location: [facility] CATH LAB;  Service: Cardiovascular;;  . PERCUTANEOUS CORONARY STENT INTERVENTION (PCI-S) N/A 09/07/2012   Procedure: PERCUTANEOUS CORONARY STENT INTERVENTION (PCI-S);  Surgeon: Troy Sine, MD;  Location: [facility] CATH LAB;  Service: Cardiovascular;  Laterality: N/A;  . PORT-A-CATH REMOVAL N/A 10/14/2013   Procedure: REMOVAL PORT-A-CATH;  Surgeon: Rolm Bookbinder, MD;  Location: [facility] ORS;  Service: General;  Laterality: N/A;  . PORTACATH PLACEMENT Right 02/17/2013   Procedure: INSERTION PORT-A-CATH;  Surgeon: Rolm Bookbinder, MD;  Location: [facility];  Service: General;  Laterality: Right;     Social History:  The patient   reports that she has never smoked. She has never used smokeless tobacco. She reports that she drinks alcohol. She reports that she does not use drugs.   Family History:  The patient's family history includes Heart disease in her father; Hypertension in her father.    ROS:  Please see the history of present illness. All other systems are reviewed and  Negative to the above problem except as noted.    PHYSICAL EXAM: VS:  BP 120/90  Pulse (!) 102   Ht 5' 5"  (1.651 m)   Wt 60.3 kg (133 lb)   SpO2 95%   BMI 22.13 kg/m   GEN: Well nourished, well developed, in no acute distress  HEENT: normal  Neck: no JVD, carotid bruits, or masses Cardiac: RRR; no murmurs, rubs, or gallops,no edema  Respiratory:  clear to auscultation bilaterally, normal work of breathing GI: soft, nontender, nondistended, + BS  No hepatomegaly  MS: no deformity Moving all extremities   Skin: warm and dry, no rash Neuro:  Strength and sensation are intact Psych: euthymic mood, full affect   EKG:  EKG is not ordered today.   Lipid Panel    Component Value Date/Time   CHOL 191 06/09/2016 1420   TRIG 114 06/09/2016 1420   HDL 61 06/09/2016 1420   CHOLHDL 3.1 06/09/2016 1420   VLDL 23 06/09/2016 1420   LDLCALC 107 06/09/2016 1420      Wt Readings from Last 3 Encounters:  04/26/18 60.3 kg (133 lb)  01/22/18 60 kg (132 lb 3.2 oz)  12/19/17 61.2 kg (135 lb)      ASSESSMENT AND PLAN:  1  CAD Doing good   No anginal symptoms     2 Systolic CHF  Volume status is OK  LVEF is mildly depressed on recent echo    I would like to keep her on current regimen  WIll see about Entresto coverage    3  HTN  Diastolic is a little high  I would not change meds for now.    4  HL  Check lipid panel   WIll check CBC, BMET, TSH  today         Signed, Dorris Carnes, MD  04/26/2018 1:47 PM    [address], [place], [place]  [address] Phone: [phone]; Fax: [phone]

## 2018-04-26 NOTE — Patient Instructions (Addendum)
Medication Instructions:  Your physician recommends that you continue on your current medications as directed. Please refer to the Current Medication list given to you today. Please return Entresto drug assistance paperwork to [place] ASAP. May receive more samples while waiting for assistance to be approved.    Labwork: Your physician recommends that you return for lab work today:  cbc,bmet,tsh,lipid,hepatic    Testing/Procedures: None ordered    Follow-Up: Your physician recommends that you schedule a follow-up appointment in:  3-4 months with Dr. Dorris Carnes   Any Other Special Instructions Will Be Listed Below (If Applicable).     If you need a refill on your cardiac medications before your next appointment, please call your pharmacy.

## 2018-04-28 ENCOUNTER — Telehealth: Payer: Self-pay | Admitting: Cardiology

## 2018-04-28 NOTE — Telephone Encounter (Signed)
I was called by [employer]- pt had a glucose of 572 two days ago. I spoke with patient, she is doing fine. She is in the process of getting a PCP. She will check her BS and adjust her sliding scale at home.  Kerin Ransom PA-C 04/28/2018 8:41 AM

## 2018-04-29 LAB — LIPID PANEL
Chol/HDL Ratio: 5.6 ratio — ABNORMAL HIGH (ref 0.0–4.4)
Cholesterol, Total: 241 mg/dL — ABNORMAL HIGH (ref 100–199)
HDL: 43 mg/dL (ref >39)
LDL Calculated: 152 mg/dL — ABNORMAL HIGH (ref 0–99)
Triglycerides: 230 mg/dL — ABNORMAL HIGH (ref 0–149)
VLDL Cholesterol Cal: 46 mg/dL — ABNORMAL HIGH (ref 5–40)

## 2018-04-29 LAB — CBC WITH DIFFERENTIAL/PLATELET
Basophils Absolute: 0 10*3/uL (ref 0.0–0.2)
Basos: 0 %
EOS (ABSOLUTE): 0.1 10*3/uL (ref 0.0–0.4)
Eos: 2 %
Hematocrit: 48.1 % — ABNORMAL HIGH (ref 34.0–46.6)
Hemoglobin: 15.4 g/dL (ref 11.1–15.9)
Immature Grans (Abs): 0 10*3/uL (ref 0.0–0.1)
Immature Granulocytes: 0 %
Lymphocytes Absolute: 0.9 10*3/uL (ref 0.7–3.1)
Lymphs: 19 %
MCH: 29.6 pg (ref 26.6–33.0)
MCHC: 32 g/dL (ref 31.5–35.7)
MCV: 92 fL (ref 79–97)
Monocytes Absolute: 0.4 10*3/uL (ref 0.1–0.9)
Monocytes: 9 %
Neutrophils Absolute: 3.3 10*3/uL (ref 1.4–7.0)
Neutrophils: 70 %
Platelets: 208 10*3/uL (ref 150–450)
RBC: 5.21 x10E6/uL (ref 3.77–5.28)
RDW: 13.6 % (ref 12.3–15.4)
WBC: 4.7 10*3/uL (ref 3.4–10.8)

## 2018-04-29 LAB — BASIC METABOLIC PANEL
BUN/Creatinine Ratio: 19 (ref 12–28)
BUN: 13 mg/dL (ref 8–27)
CO2: 23 mmol/L (ref 20–29)
Calcium: 9.7 mg/dL (ref 8.7–10.3)
Chloride: 100 mmol/L (ref 96–106)
Creatinine, Ser: 0.67 mg/dL (ref 0.57–1.00)
GFR calc Af Amer: 105 mL/min/{1.73_m2} (ref >59)
GFR calc non Af Amer: 91 mL/min/{1.73_m2} (ref >59)
Glucose: 572 mg/dL (ref 65–99)
Potassium: 4.3 mmol/L (ref 3.5–5.2)
Sodium: 136 mmol/L (ref 134–144)

## 2018-04-29 LAB — HEPATIC FUNCTION PANEL
ALT: 15 IU/L (ref 0–32)
AST: 9 IU/L (ref 0–40)
Albumin: 4.1 g/dL (ref 3.6–4.8)
Alkaline Phosphatase: 80 IU/L (ref 39–117)
Bilirubin Total: 0.6 mg/dL (ref 0.0–1.2)
Bilirubin, Direct: 0.14 mg/dL (ref 0.00–0.40)
Total Protein: 6.4 g/dL (ref 6.0–8.5)

## 2018-04-29 LAB — TSH: TSH: 1.38 u[IU]/mL (ref 0.450–4.500)

## 2018-04-30 ENCOUNTER — Other Ambulatory Visit: Payer: Self-pay

## 2018-04-30 DIAGNOSIS — E785 Hyperlipidemia, unspecified: Secondary | ICD-10-CM

## 2018-04-30 NOTE — Progress Notes (Signed)
Will forward to scheduling team to arrange pharmacy visit.

## 2018-04-30 NOTE — Progress Notes (Signed)
Left patient a message in regards to Lipid Clinic visit to consider Repatha medication to control her lipids.  Please schedule, thank you

## 2018-05-01 ENCOUNTER — Ambulatory Visit (INDEPENDENT_AMBULATORY_CARE_PROVIDER_SITE_OTHER): Payer: Medicare Other | Admitting: Pharmacist

## 2018-05-01 DIAGNOSIS — E785 Hyperlipidemia, unspecified: Secondary | ICD-10-CM

## 2018-05-01 DIAGNOSIS — I701 Atherosclerosis of renal artery: Secondary | ICD-10-CM

## 2018-05-01 NOTE — Patient Instructions (Addendum)
Please bring your prescriptions for atorvastatin 80mg  and ezetimibe 10mg  to [facility] and start taking 1 tablet each day of each medication  Try to limit your bacon and sausage  Recheck fasting cholesterol in 6 weeks. Call Jinny Blossom to get this scheduled [phone]

## 2018-05-01 NOTE — Progress Notes (Signed)
Patient ID: [name]                 DOB: 01-20-1951                    MRN: [id number]     HPI: Marisa Forbes is a 67 y.o. female patient referred to lipid clinic by Dr Harrington Challenger. PMH is significant for CAD s/p STEMI 2013 with DES to LAD and PTCA to distal LAD, embolic CVA, CHF with LVEF 40-45%, DM, HTN, and breast cancer.  Pt presents today in good spirits. She reports adherence to her cholesterol medications, however her lipids do not look as though she has been taking either medication. LDL previously 63 in 2016 when pt was likely adherence to her lipid medication, however it has notably increased since then to 100-150 on most recent lipid panels. I called her pharmacy who reported she picked up a 30 day supply of atorvastatin 5 months ago and that she has not filled her ezetimibe in 1 year. Pt then states she ran out of her ezetimibe and does not remember how long ago, but she states she has atorvastatin at home. She moved recently to [facility] however she has not had any prescriptions filled at their [facility] yet. She states she was given hard copy prescriptions at her most recent visit with Dr Harrington Challenger since she was unsure of what pharmacy she would be using.   Current Medications: atorvastatin 55m daily, ezetimibe 19mdaily  Risk Factors: CAD s/p STEMI and DES, CVA, DM, HTN, CHF LDL goal: <7028mL   Diet: Breakfast - cereal or 1 sausage or bacon with eggs 3-4x a week, toast. Lunch - sandwich. Dinner - meat and vegetables. Drinks water and diet Coke. No alcohol. Likes [place] light.   Exercise: Minimal, can walk short distances without tiring out.  Family History: The patient's family history includes Heart disease in her father; Hypertension in her father.   Social History: The patient  reports that she has never smoked. She has never used smokeless tobacco. She reports that she drinks alcohol. She reports that she does not use drugs.   Labs: 04/26/18: TC 241, TG 230, HDL 43, LDL 152 (pt  reports adherence to atorvastatin however had not had med filled in 5 months)  Past Medical History:  Diagnosis Date  . Arthritis    back  . Balance problems    due to stroke  . Breast cancer ([place]14   a. 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 S/p chemo with CMF and radiation.  . CHF (congestive heart failure) ([facility]  On Monday she said her cardiologist was concerned for possible CHF, but is now improving-per pt.(07/26/15)  . Coronary artery disease    a. STEMI 08/2012: s/p DES to LAD, PTCA to downstream LAD. b. Relook cath same hospitalization: stable post-PCI anatomy with Stable Diag lesions (unlikely cause of resting angina, not good PCI targets), stable RCA lesion (non flow limiting).  . Diabetes mellitus without complication ([place]  Takes insulin and metformin  . GERD (gastroesophageal reflux disease)    food related  . History of cancer chemotherapy    last in August 2014  . 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 History of radiation therapy 06/23/2013-08/08/2013   62.4 gray to left breast  .  HTN (hypertension)   . Hypercholesteremia   . LV dysfunction    a. EF 45-50% at time of STEMI 08/2012. b. Echo 04/2013: EF 45-50%, mild focal basal hypertrophy of septum, grade 1 d/d.  Marland Kitchen Myocardial infarction ([place]) 09/07/12  . Peripheral vision loss    left  . Renal artery stenosis (HCC)    a. 20% L RAS in 08/2012 by cath. b. Duplex 07/2012: >60% L RAS.  . Stroke, embolic ([place])    a. 34/2876 post cath.  . Vasovagal syncope    a. 04/2013 - echo stable compared to prior EF 45-50%, negative carotid duplex.    Current Outpatient Medications on File Prior to Visit  Medication Sig Dispense Refill  . aspirin 81 MG EC tablet Take 1 tablet (81 mg total) by mouth daily. 30 tablet 12    . atorvastatin (LIPITOR) 80 MG tablet Take 1 tablet (80 mg total) by mouth daily. 30 tablet 11  . carvedilol (COREG) 25 MG tablet TAKE 1 TABLET(25 MG) BY MOUTH TWICE DAILY WITH A MEAL 60 tablet 11  . ezetimibe (ZETIA) 10 MG tablet TAKE 1 TABLET(10 MG) BY MOUTH DAILY 30 tablet 11  . furosemide (LASIX) 40 MG tablet Take 1 tablet (40 mg total) by mouth daily. 30 tablet 11  . glucose blood (ONETOUCH VERIO) test strip Use TID 100 each 12  . Insulin Glargine (LANTUS SOLOSTAR) 100 UNIT/ML Solostar Pen Inject 18 units [place] at bedtime. 1 pen 3  . insulin lispro (HUMALOG) 100 UNIT/ML injection Inject 15-18 Units into the skin every morning.     . isosorbide mononitrate (IMDUR) 60 MG 24 hr tablet Take 1 tablet (60 mg total) by mouth daily. Please call and make an appt with Dr. Harrington Challenger for further refills. 30 tablet 11  . nitroGLYCERIN (NITROSTAT) 0.4 MG SL tablet Place 1 tablet (0.4 mg total) under the tongue every 5 (five) minutes as needed for chest pain. 25 tablet 11  . sacubitril-valsartan (ENTRESTO) 49-51 MG Take 1 tablet by mouth 2 (two) times daily. 60 tablet 11  . spironolactone (ALDACTONE) 25 MG tablet Take 1 tablet (25 mg total) by mouth daily. 90 tablet 11   No current facility-administered medications on file prior to visit.     No Known Allergies  Assessment/Plan:  1. Hyperlipidemia - LDL 132m/dL above goal <771mdL due to hx of ASCVD. Pt reported initial adherence to her atorvastatin 8087maily and ezetimibe 42m61mily, however her pharmacy reported pt had not picked up her atorvastatin in 5 months or her ezetimibe in 1 year. She has not been filling medications at any other pharmacy. Suspect noncompliance as this is the highest patient's LDL has been in our system and it was previously controlled at 63 in 2016 which suggests much better medication adherence at that time. Discussed the need for medication adherence and advised pt to refill her atorvastatin and ezetimibe today. Will recheck  fasting lipids in 6 weeks - pt to call when she knows her availability. Once medication adherence improves, suspect LDL will be at goal. If not, can consider PCSK9i at that time.   Inza Mikrut E. Tremon Sainvil, PharmD, BCACP, CPP [id number] [id number]: [phone]: [phone] 10:20 AM

## 2018-05-06 ENCOUNTER — Telehealth: Payer: Self-pay

## 2018-05-06 ENCOUNTER — Other Ambulatory Visit: Payer: Self-pay

## 2018-05-06 MED ORDER — SACUBITRIL-VALSARTAN 49-51 MG PO TABS: 1.0000 | ORAL_TABLET | Freq: Two times a day (BID) | ORAL | 3 refills | Status: DC

## 2018-05-06 NOTE — Telephone Encounter (Signed)
**Note De-Identified Samad Thon Obfuscation** The pt returned her [facility] pt asst application to the office. I have completed the provider part of the application, printed an [employer], and placed both in Dr [employer] mail bin awaiting her signature.

## 2018-05-10 NOTE — Telephone Encounter (Signed)
**Note De-Identified Jacody Beneke Obfuscation** Dr Harrington Challenger has signed the [facility] pt asst application and [employer]. I have faxed all to [facility].

## 2018-05-14 NOTE — Telephone Encounter (Signed)
Letter received from [facility] via fax stating that a benefit investigation was conducted and coverage has been confirmed without a PA requirement.  They have notified the pt and per the letter she requested to be referred to [employer] [place] for financial assistance for [employer]. [employer] foundation will contact the pt regrading enrollment and eligibility.

## 2018-07-08 ENCOUNTER — Telehealth: Payer: Self-pay | Admitting: Pharmacist

## 2018-07-08 NOTE — Telephone Encounter (Signed)
Have spoken with pt 3 times attempting to schedule f/u lipid panel. Each time she states she needs to check her work schedule and will call us the next day to get labs scheduled however she has not done so. Left message one last time for pt to schedule lipids and will await return call.

## 2018-08-02 ENCOUNTER — Ambulatory Visit: Payer: Medicare Other | Admitting: Internal Medicine

## 2018-08-23 ENCOUNTER — Ambulatory Visit: Payer: Medicare Other | Admitting: Nurse Practitioner

## 2018-12-10 ENCOUNTER — Inpatient Hospital Stay (HOSPITAL_COMMUNITY)
Admission: EM | Admit: 2018-12-10 | Discharge: 2018-12-14 | DRG: 345 | Disposition: A | Discharge status: Home / Self Care | Payer: Medicare Other | Attending: Internal Medicine | Admitting: Internal Medicine

## 2018-12-10 ENCOUNTER — Other Ambulatory Visit: Payer: Self-pay

## 2018-12-10 DIAGNOSIS — I5042 Chronic combined systolic (congestive) and diastolic (congestive) heart failure: Secondary | ICD-10-CM | POA: Diagnosis not present

## 2018-12-10 DIAGNOSIS — E7800 Pure hypercholesterolemia, unspecified: Secondary | ICD-10-CM | POA: Diagnosis present

## 2018-12-10 DIAGNOSIS — Z923 Personal history of irradiation: Secondary | ICD-10-CM

## 2018-12-10 DIAGNOSIS — Z9221 Personal history of antineoplastic chemotherapy: Secondary | ICD-10-CM

## 2018-12-10 DIAGNOSIS — G9389 Other specified disorders of brain: Secondary | ICD-10-CM | POA: Diagnosis not present

## 2018-12-10 DIAGNOSIS — N390 Urinary tract infection, site not specified: Secondary | ICD-10-CM | POA: Diagnosis not present

## 2018-12-10 DIAGNOSIS — I252 Old myocardial infarction: Secondary | ICD-10-CM

## 2018-12-10 DIAGNOSIS — I110 Hypertensive heart disease with heart failure: Secondary | ICD-10-CM | POA: Diagnosis present

## 2018-12-10 DIAGNOSIS — J90 Pleural effusion, not elsewhere classified: Secondary | ICD-10-CM | POA: Diagnosis not present

## 2018-12-10 DIAGNOSIS — E1142 Type 2 diabetes mellitus with diabetic polyneuropathy: Secondary | ICD-10-CM | POA: Diagnosis present

## 2018-12-10 DIAGNOSIS — K644 Residual hemorrhoidal skin tags: Secondary | ICD-10-CM | POA: Diagnosis present

## 2018-12-10 DIAGNOSIS — Z794 Long term (current) use of insulin: Secondary | ICD-10-CM

## 2018-12-10 DIAGNOSIS — Z171 Estrogen receptor negative status [ER-]: Secondary | ICD-10-CM

## 2018-12-10 DIAGNOSIS — K5900 Constipation, unspecified: Secondary | ICD-10-CM | POA: Diagnosis present

## 2018-12-10 DIAGNOSIS — I10 Essential (primary) hypertension: Secondary | ICD-10-CM | POA: Diagnosis not present

## 2018-12-10 DIAGNOSIS — E119 Type 2 diabetes mellitus without complications: Secondary | ICD-10-CM

## 2018-12-10 DIAGNOSIS — Z79899 Other long term (current) drug therapy: Secondary | ICD-10-CM

## 2018-12-10 DIAGNOSIS — K611 Rectal abscess: Principal | ICD-10-CM | POA: Diagnosis present

## 2018-12-10 DIAGNOSIS — K6289 Other specified diseases of anus and rectum: Secondary | ICD-10-CM | POA: Diagnosis not present

## 2018-12-10 DIAGNOSIS — R634 Abnormal weight loss: Secondary | ICD-10-CM

## 2018-12-10 DIAGNOSIS — Z681 Body mass index (BMI) 19 or less, adult: Secondary | ICD-10-CM | POA: Diagnosis not present

## 2018-12-10 DIAGNOSIS — E1159 Type 2 diabetes mellitus with other circulatory complications: Secondary | ICD-10-CM

## 2018-12-10 DIAGNOSIS — N2889 Other specified disorders of kidney and ureter: Secondary | ICD-10-CM | POA: Diagnosis present

## 2018-12-10 DIAGNOSIS — Z8673 Personal history of transient ischemic attack (TIA), and cerebral infarction without residual deficits: Secondary | ICD-10-CM

## 2018-12-10 DIAGNOSIS — E861 Hypovolemia: Secondary | ICD-10-CM | POA: Diagnosis present

## 2018-12-10 DIAGNOSIS — K219 Gastro-esophageal reflux disease without esophagitis: Secondary | ICD-10-CM | POA: Diagnosis present

## 2018-12-10 DIAGNOSIS — Z853 Personal history of malignant neoplasm of breast: Secondary | ICD-10-CM | POA: Diagnosis not present

## 2018-12-10 DIAGNOSIS — Z955 Presence of coronary angioplasty implant and graft: Secondary | ICD-10-CM

## 2018-12-10 DIAGNOSIS — E1165 Type 2 diabetes mellitus with hyperglycemia: Secondary | ICD-10-CM

## 2018-12-10 DIAGNOSIS — H547 Unspecified visual loss: Secondary | ICD-10-CM | POA: Diagnosis present

## 2018-12-10 DIAGNOSIS — N289 Disorder of kidney and ureter, unspecified: Secondary | ICD-10-CM | POA: Diagnosis not present

## 2018-12-10 DIAGNOSIS — I2510 Atherosclerotic heart disease of native coronary artery without angina pectoris: Secondary | ICD-10-CM | POA: Diagnosis present

## 2018-12-10 DIAGNOSIS — Z9889 Other specified postprocedural states: Secondary | ICD-10-CM

## 2018-12-10 DIAGNOSIS — Z7982 Long term (current) use of aspirin: Secondary | ICD-10-CM

## 2018-12-10 LAB — URINALYSIS, ROUTINE W REFLEX MICROSCOPIC
Bilirubin Urine: NEGATIVE
Glucose, UA: >=500 mg/dL — AB
Ketones, ur: 20 mg/dL — AB
Nitrite: NEGATIVE
Protein, ur: 100 mg/dL — AB
Specific Gravity, Urine: 1.025 (ref 1.005–1.030)
pH: 5 (ref 5.0–8.0)

## 2018-12-10 LAB — CBC
HCT: 45.6 % (ref 36.0–46.0)
Hemoglobin: 14.9 g/dL (ref 12.0–15.0)
MCH: 28.8 pg (ref 26.0–34.0)
MCHC: 32.7 g/dL (ref 30.0–36.0)
MCV: 88.2 fL (ref 80.0–100.0)
Platelets: 203 10*3/uL (ref 150–400)
RBC: 5.17 MIL/uL — ABNORMAL HIGH (ref 3.87–5.11)
RDW: 13.9 % (ref 11.5–15.5)
WBC: 11.8 10*3/uL — ABNORMAL HIGH (ref 4.0–10.5)
nRBC: 0 % (ref 0.0–0.2)

## 2018-12-10 LAB — BASIC METABOLIC PANEL
Anion gap: 15 (ref 5–15)
BUN: 14 mg/dL (ref 8–23)
CO2: 22 mmol/L (ref 22–32)
Calcium: 9.3 mg/dL (ref 8.9–10.3)
Chloride: 96 mmol/L — ABNORMAL LOW (ref 98–111)
Creatinine, Ser: 0.67 mg/dL (ref 0.44–1.00)
GFR calc Af Amer: >60 mL/min (ref >60)
GFR calc non Af Amer: >60 mL/min (ref >60)
Glucose, Bld: 311 mg/dL — ABNORMAL HIGH (ref 70–99)
Potassium: 3.7 mmol/L (ref 3.5–5.1)
Sodium: 133 mmol/L — ABNORMAL LOW (ref 135–145)

## 2018-12-10 NOTE — ED Triage Notes (Addendum)
Pt from home, family at bedside, reports rectal pain for 2 days. Pt reports raised area at rectum. No blood in stool. Pt reports last BM 2/10.  Pt family expresses concern that pt has had decreased appetite, also state they think she had a stroke 2 weeks ago, but did not seek treatment.

## 2018-12-10 NOTE — ED Notes (Signed)
Ultrasound at bedside

## 2018-12-11 ENCOUNTER — Encounter (HOSPITAL_COMMUNITY)
Admission: EM | Disposition: A | Discharge status: Home / Self Care | Payer: Self-pay | Source: Home / Self Care | Attending: Internal Medicine

## 2018-12-11 ENCOUNTER — Observation Stay (HOSPITAL_COMMUNITY): Payer: Medicare Other | Admitting: Registered Nurse

## 2018-12-11 ENCOUNTER — Emergency Department (HOSPITAL_COMMUNITY): Payer: Medicare Other

## 2018-12-11 ENCOUNTER — Encounter (HOSPITAL_COMMUNITY): Payer: Self-pay

## 2018-12-11 ENCOUNTER — Other Ambulatory Visit: Payer: Self-pay

## 2018-12-11 DIAGNOSIS — Z794 Long term (current) use of insulin: Secondary | ICD-10-CM

## 2018-12-11 DIAGNOSIS — K6289 Other specified diseases of anus and rectum: Secondary | ICD-10-CM | POA: Diagnosis not present

## 2018-12-11 DIAGNOSIS — J90 Pleural effusion, not elsewhere classified: Secondary | ICD-10-CM | POA: Diagnosis not present

## 2018-12-11 DIAGNOSIS — N2889 Other specified disorders of kidney and ureter: Secondary | ICD-10-CM

## 2018-12-11 DIAGNOSIS — E1142 Type 2 diabetes mellitus with diabetic polyneuropathy: Secondary | ICD-10-CM

## 2018-12-11 DIAGNOSIS — Z9221 Personal history of antineoplastic chemotherapy: Secondary | ICD-10-CM | POA: Diagnosis not present

## 2018-12-11 DIAGNOSIS — Z853 Personal history of malignant neoplasm of breast: Secondary | ICD-10-CM | POA: Diagnosis not present

## 2018-12-11 DIAGNOSIS — Z8673 Personal history of transient ischemic attack (TIA), and cerebral infarction without residual deficits: Secondary | ICD-10-CM

## 2018-12-11 DIAGNOSIS — G9389 Other specified disorders of brain: Secondary | ICD-10-CM | POA: Diagnosis not present

## 2018-12-11 DIAGNOSIS — N390 Urinary tract infection, site not specified: Secondary | ICD-10-CM | POA: Diagnosis not present

## 2018-12-11 DIAGNOSIS — K611 Rectal abscess: Secondary | ICD-10-CM | POA: Diagnosis not present

## 2018-12-11 DIAGNOSIS — R634 Abnormal weight loss: Secondary | ICD-10-CM

## 2018-12-11 DIAGNOSIS — Z681 Body mass index (BMI) 19 or less, adult: Secondary | ICD-10-CM | POA: Diagnosis not present

## 2018-12-11 DIAGNOSIS — Z955 Presence of coronary angioplasty implant and graft: Secondary | ICD-10-CM | POA: Diagnosis not present

## 2018-12-11 DIAGNOSIS — I5042 Chronic combined systolic (congestive) and diastolic (congestive) heart failure: Secondary | ICD-10-CM | POA: Diagnosis not present

## 2018-12-11 DIAGNOSIS — E7800 Pure hypercholesterolemia, unspecified: Secondary | ICD-10-CM | POA: Diagnosis not present

## 2018-12-11 DIAGNOSIS — I110 Hypertensive heart disease with heart failure: Secondary | ICD-10-CM | POA: Diagnosis not present

## 2018-12-11 DIAGNOSIS — N289 Disorder of kidney and ureter, unspecified: Secondary | ICD-10-CM | POA: Diagnosis not present

## 2018-12-11 DIAGNOSIS — I2510 Atherosclerotic heart disease of native coronary artery without angina pectoris: Secondary | ICD-10-CM | POA: Diagnosis not present

## 2018-12-11 DIAGNOSIS — Z923 Personal history of irradiation: Secondary | ICD-10-CM | POA: Diagnosis not present

## 2018-12-11 HISTORY — PX: INCISION AND DRAINAGE PERIRECTAL ABSCESS: SHX1804

## 2018-12-11 LAB — GLUCOSE, CAPILLARY
Glucose-Capillary: 321 mg/dL — ABNORMAL HIGH (ref 70–99)
Glucose-Capillary: 323 mg/dL — ABNORMAL HIGH (ref 70–99)
Glucose-Capillary: 271 mg/dL — ABNORMAL HIGH (ref 70–99)
Glucose-Capillary: 241 mg/dL — ABNORMAL HIGH (ref 70–99)
Glucose-Capillary: 218 mg/dL — ABNORMAL HIGH (ref 70–99)
Glucose-Capillary: 234 mg/dL — ABNORMAL HIGH (ref 70–99)
Glucose-Capillary: 278 mg/dL — ABNORMAL HIGH (ref 70–99)

## 2018-12-11 LAB — CBC WITH DIFFERENTIAL/PLATELET
Abs Immature Granulocytes: 0.03 10*3/uL (ref 0.00–0.07)
Basophils Absolute: 0 10*3/uL (ref 0.0–0.1)
Basophils Relative: 0 %
Eosinophils Absolute: 0 10*3/uL (ref 0.0–0.5)
Eosinophils Relative: 0 %
HCT: 42.7 % (ref 36.0–46.0)
Hemoglobin: 13.7 g/dL (ref 12.0–15.0)
Immature Granulocytes: 0 %
Lymphocytes Relative: 6 %
Lymphs Abs: 0.6 10*3/uL — ABNORMAL LOW (ref 0.7–4.0)
MCH: 27.8 pg (ref 26.0–34.0)
MCHC: 32.1 g/dL (ref 30.0–36.0)
MCV: 86.6 fL (ref 80.0–100.0)
Monocytes Absolute: 0.8 10*3/uL (ref 0.1–1.0)
Monocytes Relative: 9 %
Neutro Abs: 7.7 10*3/uL (ref 1.7–7.7)
Neutrophils Relative %: 85 %
Platelets: 181 10*3/uL (ref 150–400)
RBC: 4.93 MIL/uL (ref 3.87–5.11)
RDW: 13.8 % (ref 11.5–15.5)
WBC: 9.1 10*3/uL (ref 4.0–10.5)
nRBC: 0 % (ref 0.0–0.2)

## 2018-12-11 LAB — LACTATE DEHYDROGENASE: LDH: 144 U/L (ref 98–192)

## 2018-12-11 LAB — HEPATIC FUNCTION PANEL
ALT: 16 U/L (ref 0–44)
AST: 15 U/L (ref 15–41)
Albumin: 3.3 g/dL — ABNORMAL LOW (ref 3.5–5.0)
Alkaline Phosphatase: 93 U/L (ref 38–126)
Bilirubin, Direct: 0.3 mg/dL — ABNORMAL HIGH (ref 0.0–0.2)
Indirect Bilirubin: 1.1 mg/dL — ABNORMAL HIGH (ref 0.3–0.9)
Total Bilirubin: 1.4 mg/dL — ABNORMAL HIGH (ref 0.3–1.2)
Total Protein: 6.3 g/dL — ABNORMAL LOW (ref 6.5–8.1)

## 2018-12-11 LAB — I-STAT TROPONIN, ED: Troponin i, poc: 0.02 ng/mL (ref 0.00–0.08)

## 2018-12-11 LAB — BASIC METABOLIC PANEL
Anion gap: 16 — ABNORMAL HIGH (ref 5–15)
BUN: 15 mg/dL (ref 8–23)
CO2: 20 mmol/L — ABNORMAL LOW (ref 22–32)
Calcium: 8.5 mg/dL — ABNORMAL LOW (ref 8.9–10.3)
Chloride: 93 mmol/L — ABNORMAL LOW (ref 98–111)
Creatinine, Ser: 0.53 mg/dL (ref 0.44–1.00)
GFR calc Af Amer: >60 mL/min (ref >60)
GFR calc non Af Amer: >60 mL/min (ref >60)
Glucose, Bld: 335 mg/dL — ABNORMAL HIGH (ref 70–99)
Potassium: 3.4 mmol/L — ABNORMAL LOW (ref 3.5–5.1)
Sodium: 129 mmol/L — ABNORMAL LOW (ref 135–145)

## 2018-12-11 LAB — HEMOGLOBIN A1C
Hgb A1c MFr Bld: 13.8 % — ABNORMAL HIGH (ref 4.8–5.6)
Mean Plasma Glucose: 349.36 mg/dL

## 2018-12-11 SURGERY — INCISION AND DRAINAGE, ABSCESS, PERIRECTAL
Anesthesia: General

## 2018-12-11 MED ORDER — ONDANSETRON HCL 4 MG PO TABS: 4.0000 mg | ORAL_TABLET | Freq: Four times a day (QID) | ORAL | Status: DC | PRN

## 2018-12-11 MED ORDER — ACETAMINOPHEN 325 MG PO TABS: 650.0000 mg | ORAL_TABLET | Freq: Four times a day (QID) | ORAL | Status: DC | PRN

## 2018-12-11 MED ORDER — ONDANSETRON HCL 4 MG/2ML IJ SOLN
INTRAMUSCULAR | Status: AC
  Filled 2018-12-11: qty 2

## 2018-12-11 MED ORDER — ONDANSETRON HCL 4 MG/2ML IJ SOLN
INTRAMUSCULAR | Status: DC | PRN
  Administered 2018-12-11 (×2): 4 mg via INTRAVENOUS

## 2018-12-11 MED ORDER — IOHEXOL 300 MG/ML  SOLN
30.0000 mL | Freq: Once | INTRAMUSCULAR | Status: AC | PRN
  Administered 2018-12-11: 30 mL via ORAL

## 2018-12-11 MED ORDER — BUPIVACAINE LIPOSOME 1.3 % IJ SUSP
20.0000 mL | Freq: Once | INTRAMUSCULAR | Status: AC
  Administered 2018-12-11: 20 mL
  Filled 2018-12-11: qty 20

## 2018-12-11 MED ORDER — LIDOCAINE 2% (20 MG/ML) 5 ML SYRINGE
INTRAMUSCULAR | Status: AC
  Filled 2018-12-11: qty 5

## 2018-12-11 MED ORDER — SODIUM CHLORIDE 0.9 % IV SOLN
2.0000 g | INTRAVENOUS | Status: DC
  Administered 2018-12-11 – 2018-12-13 (×3): 2 g via INTRAVENOUS
  Filled 2018-12-11 (×3): qty 2

## 2018-12-11 MED ORDER — SODIUM CHLORIDE 0.9 % IV SOLN
INTRAVENOUS | Status: AC
  Administered 2018-12-11: 06:00:00 via INTRAVENOUS

## 2018-12-11 MED ORDER — IOPAMIDOL (ISOVUE-300) INJECTION 61%
100.0000 mL | Freq: Once | INTRAVENOUS | Status: AC | PRN
  Administered 2018-12-11: 100 mL via INTRAVENOUS

## 2018-12-11 MED ORDER — LACTATED RINGERS IV SOLN
INTRAVENOUS | Status: DC | PRN
  Administered 2018-12-11: 13:00:00 via INTRAVENOUS

## 2018-12-11 MED ORDER — MIDAZOLAM HCL 2 MG/2ML IJ SOLN
INTRAMUSCULAR | Status: AC
  Filled 2018-12-11: qty 2

## 2018-12-11 MED ORDER — SODIUM CHLORIDE 0.9 % IV BOLUS
1000.0000 mL | Freq: Once | INTRAVENOUS | Status: AC
  Administered 2018-12-11: 1000 mL via INTRAVENOUS

## 2018-12-11 MED ORDER — HEPARIN SODIUM (PORCINE) 5000 UNIT/ML IJ SOLN
5000.0000 [IU] | Freq: Three times a day (TID) | INTRAMUSCULAR | Status: DC
  Administered 2018-12-11 – 2018-12-14 (×9): 5000 [IU] via SUBCUTANEOUS
  Filled 2018-12-11 (×9): qty 1

## 2018-12-11 MED ORDER — METRONIDAZOLE IN NACL 5-0.79 MG/ML-% IV SOLN
500.0000 mg | Freq: Once | INTRAVENOUS | Status: AC
  Administered 2018-12-11: 500 mg via INTRAVENOUS
  Filled 2018-12-11: qty 100

## 2018-12-11 MED ORDER — MIDAZOLAM HCL 5 MG/5ML IJ SOLN
INTRAMUSCULAR | Status: DC | PRN
  Administered 2018-12-11: 1 mg via INTRAVENOUS

## 2018-12-11 MED ORDER — FENTANYL CITRATE (PF) 250 MCG/5ML IJ SOLN
INTRAMUSCULAR | Status: AC
  Filled 2018-12-11: qty 5

## 2018-12-11 MED ORDER — IOPAMIDOL (ISOVUE-300) INJECTION 61%
INTRAVENOUS | Status: AC
  Filled 2018-12-11: qty 100

## 2018-12-11 MED ORDER — ONDANSETRON HCL 4 MG/2ML IJ SOLN: 4.0000 mg | Freq: Four times a day (QID) | INTRAMUSCULAR | Status: DC | PRN

## 2018-12-11 MED ORDER — INSULIN GLARGINE 100 UNIT/ML ~~LOC~~ SOLN
9.0000 [IU] | Freq: Every day | SUBCUTANEOUS | Status: DC
  Administered 2018-12-11: 9 [IU] via SUBCUTANEOUS
  Filled 2018-12-11 (×2): qty 0.09

## 2018-12-11 MED ORDER — SODIUM CHLORIDE (PF) 0.9 % IJ SOLN
INTRAMUSCULAR | Status: AC
  Filled 2018-12-11: qty 50

## 2018-12-11 MED ORDER — LIDOCAINE 2% (20 MG/ML) 5 ML SYRINGE
INTRAMUSCULAR | Status: DC | PRN
  Administered 2018-12-11: 25 mg via INTRAVENOUS
  Administered 2018-12-11: 100 mg via INTRAVENOUS

## 2018-12-11 MED ORDER — HYDROGEN PEROXIDE 3 % EX SOLN
CUTANEOUS | Status: DC | PRN
  Administered 2018-12-11: 1

## 2018-12-11 MED ORDER — ACETAMINOPHEN 650 MG RE SUPP: 650.0000 mg | Freq: Four times a day (QID) | RECTAL | Status: DC | PRN

## 2018-12-11 MED ORDER — SUCCINYLCHOLINE CHLORIDE 200 MG/10ML IV SOSY
PREFILLED_SYRINGE | INTRAVENOUS | Status: DC | PRN
  Administered 2018-12-11: 120 mg via INTRAVENOUS

## 2018-12-11 MED ORDER — HYDRALAZINE HCL 20 MG/ML IJ SOLN: 10.0000 mg | INTRAMUSCULAR | Status: DC | PRN

## 2018-12-11 MED ORDER — FENTANYL CITRATE (PF) 100 MCG/2ML IJ SOLN
INTRAMUSCULAR | Status: DC | PRN
  Administered 2018-12-11: 100 ug via INTRAVENOUS

## 2018-12-11 MED ORDER — ROCURONIUM BROMIDE 100 MG/10ML IV SOLN
INTRAVENOUS | Status: AC
  Filled 2018-12-11: qty 1

## 2018-12-11 MED ORDER — INSULIN ASPART 100 UNIT/ML ~~LOC~~ SOLN
0.0000 [IU] | SUBCUTANEOUS | Status: DC
  Administered 2018-12-11 (×3): 5 [IU] via SUBCUTANEOUS
  Administered 2018-12-11 – 2018-12-12 (×2): 3 [IU] via SUBCUTANEOUS
  Administered 2018-12-12 (×3): 5 [IU] via SUBCUTANEOUS
  Administered 2018-12-12: 1 [IU] via SUBCUTANEOUS
  Administered 2018-12-13: 3 [IU] via SUBCUTANEOUS
  Administered 2018-12-13 (×2): 2 [IU] via SUBCUTANEOUS
  Administered 2018-12-13: 5 [IU] via SUBCUTANEOUS
  Administered 2018-12-13: 3 [IU] via SUBCUTANEOUS
  Administered 2018-12-14 (×2): 7 [IU] via SUBCUTANEOUS
  Administered 2018-12-14: 3 [IU] via SUBCUTANEOUS

## 2018-12-11 MED ORDER — PROPOFOL 10 MG/ML IV BOLUS
INTRAVENOUS | Status: DC | PRN
  Administered 2018-12-11: 100 mg via INTRAVENOUS

## 2018-12-11 MED ORDER — METRONIDAZOLE IN NACL 5-0.79 MG/ML-% IV SOLN
500.0000 mg | Freq: Three times a day (TID) | INTRAVENOUS | Status: DC
  Administered 2018-12-11 – 2018-12-13 (×7): 500 mg via INTRAVENOUS
  Filled 2018-12-11 (×7): qty 100

## 2018-12-11 MED ORDER — LACTATED RINGERS IV SOLN: INTRAVENOUS | Status: DC | PRN

## 2018-12-11 MED ORDER — HYDROCODONE-ACETAMINOPHEN 5-325 MG PO TABS
1.0000 | ORAL_TABLET | ORAL | Status: DC | PRN
  Administered 2018-12-11 – 2018-12-14 (×4): 1 via ORAL
  Filled 2018-12-11 (×4): qty 1

## 2018-12-11 MED ORDER — SODIUM CHLORIDE 0.9 % IV SOLN
1.0000 g | Freq: Once | INTRAVENOUS | Status: AC
  Administered 2018-12-11: 1 g via INTRAVENOUS
  Filled 2018-12-11: qty 10

## 2018-12-11 MED ORDER — PROPOFOL 10 MG/ML IV BOLUS
INTRAVENOUS | Status: AC
  Filled 2018-12-11: qty 20

## 2018-12-11 MED ORDER — DEXAMETHASONE SODIUM PHOSPHATE 10 MG/ML IJ SOLN
INTRAMUSCULAR | Status: AC
  Filled 2018-12-11: qty 1

## 2018-12-11 MED ORDER — HYDROGEN PEROXIDE 3 % EX SOLN
CUTANEOUS | Status: AC
  Filled 2018-12-11: qty 473

## 2018-12-11 MED ORDER — SUCCINYLCHOLINE CHLORIDE 200 MG/10ML IV SOSY
PREFILLED_SYRINGE | INTRAVENOUS | Status: AC
  Filled 2018-12-11: qty 10

## 2018-12-11 SURGICAL SUPPLY — 21 items
BLADE SURG SZ20 CARB STEEL (BLADE) ×3 IMPLANT
COVER SURGICAL LIGHT HANDLE (MISCELLANEOUS) ×3 IMPLANT
COVER WAND RF STERILE (DRAPES) IMPLANT
DRAPE SHEET LG 3/4 BI-LAMINATE (DRAPES) ×3 IMPLANT
ELECT PENCIL ROCKER SW 15FT (MISCELLANEOUS) ×1 IMPLANT
GAUZE 4X4 16PLY RFD (DISPOSABLE) ×3 IMPLANT
GAUZE IODOFORM PACK 1/2 7832 (GAUZE/BANDAGES/DRESSINGS) ×3 IMPLANT
GAUZE PACKING IODOFORM 1/2 (PACKING) ×2 IMPLANT
GAUZE SPONGE 4X4 12PLY STRL (GAUZE/BANDAGES/DRESSINGS) ×3 IMPLANT
GLOVE BIOGEL M 8.0 STRL (GLOVE) ×3 IMPLANT
GLOVE BIOGEL PI IND STRL 7.0 (GLOVE) ×1 IMPLANT
GLOVE BIOGEL PI INDICATOR 7.0 (GLOVE) ×12
GOWN SPEC L4 XLG W/TWL (GOWN DISPOSABLE) ×1 IMPLANT
GOWN STRL REUS W/TWL LRG LVL3 (GOWN DISPOSABLE) ×7 IMPLANT
GOWN STRL REUS W/TWL XL LVL3 (GOWN DISPOSABLE) ×3 IMPLANT
KIT BASIN OR (CUSTOM PROCEDURE TRAY) ×3 IMPLANT
PACK GENERAL/GYN (CUSTOM PROCEDURE TRAY) ×2 IMPLANT
PACK LITHOTOMY IV (CUSTOM PROCEDURE TRAY) ×1 IMPLANT
SWAB CULTURE ESWAB REG 1ML (MISCELLANEOUS) ×3 IMPLANT
TOWEL OR 17X26 10 PK STRL BLUE (TOWEL DISPOSABLE) ×3 IMPLANT
YANKAUER SUCT BULB TIP 10FT TU (MISCELLANEOUS) ×6 IMPLANT

## 2018-12-11 NOTE — Interval H&P Note (Signed)
History and Physical Interval Note:  12/11/2018 11:38 AM  Marisa Forbes  has presented today for surgery, with the diagnosis of perirectal abcess  The various methods of treatment have been discussed with the patient and family. After consideration of risks, benefits and other options for treatment, the patient has consented to  Procedure(s): IRRIGATION AND DEBRIDEMENT PERIRECTAL ABSCESS (N/A) as a surgical intervention .  The patient's history has been reviewed, patient examined, no change in status, stable for surgery.  I have reviewed the patient's chart and labs.  Questions were answered to the patient's satisfaction.     [name]

## 2018-12-11 NOTE — Op Note (Signed)
Marisa Forbes  [birth year] 11 December 2018    PCP:  Patient, No Pcp Per   Surgeon: Kaylyn Lim, MD, FACS  Asst:  none  Anes:  general  Preop Dx: Right perirectal abscess Postop Dx: same  Procedure: Incision and drainage of larger right perirectal abscess Location Surgery: [facility] 4 Complications: None noted  EBL:   minimal cc  Drains: None but 1/2 " packing into cavity  Description of Procedure:  The patient was taken to OR 4 .  After anesthesia was administered and the patient was prepped  with [employer] and a timeout was performed.  EUA performed and fluctuant area opened with radial incision and with blunt dissection with my finger and the Yankour sucker the foul smelling pus filled cavity was evacuated.  The cavity was irrigated with hydrogen peroxide and   1/2 " packing placed.  Dressings and mesh panties applied.    The patient tolerated the procedure well and was taken to the PACU in stable condition.     Matt B. Hassell Done, [place], [facility], [place]

## 2018-12-11 NOTE — ED Notes (Signed)
Pt to CT

## 2018-12-11 NOTE — H&P (View-Only) (Signed)
[facility] Surgery Consult Note  Marisa Forbes [birth year]  [id number].    Requesting MD: Nevada Crane Chief Complaint/Reason for Consult: perirectal abscess  HPI:  Patient is a 68 year old female who presented to [facility] last night with rectal pain and fullness. Pain started 2 days ago and has progressively worsened. Pain is constant and located in right gluteus. Not improved by anything at home, improved somewhat with pain medication here. Patient has never had a perirectal abscess in the past. Reports associated constipation secondary to pain. Denies diarrhea, bloody stools, abdominal pain, nausea, vomiting. Patient denies urinary symptoms, but was told on admission that she has a UTI. Patient does report increased fatigue and weight loss over the last month or so. Has lost about 20 lbs. Also seen on CT was left renal mass. Denies fever, chills, chest pain, palpitations. Patient has some chronic DOE, but does not feel this has worsened acutely. PMH significant for chronic combined CHF, insulin dependent type 2 diabetes mellitus, Hx of CVA, Hx of breast cancer s/p lumpectomy, CAD w/ Hx of MI s/p stent. Blood glucose runs in the 200s usually at home, patient is now on insulin only. Reports she knows it is poorly controlled at times because she enjoys sweets. Patient is not on any blood thinning medications. Patient denies alcohol, tobacco, and illicit drug use.    ROS: Review of Systems  Constitutional: Positive for malaise/fatigue and weight loss. Negative for chills and fever.  Respiratory: Positive for shortness of breath. Negative for wheezing.   Cardiovascular: Negative for chest pain, palpitations and leg swelling.  Gastrointestinal: Positive for constipation. Negative for abdominal pain, blood in stool, diarrhea, nausea and vomiting.  Genitourinary: Negative for dysuria, frequency, hematuria and urgency.       Rectal pain and swelling   All other systems reviewed and are negative.   Family  History  Problem Relation Age of Onset  . Heart disease Father   . Hypertension Father   . Cancer Neg Hx   . Diabetes Neg Hx   . Hyperlipidemia Neg Hx   . Kidney disease Neg Hx   . Stroke Neg Hx     Past Medical History:  Diagnosis Date  . Arthritis    back  . Balance problems    due to stroke  . Breast cancer ([place]) 12/03/12   a. 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 S/p chemo with CMF and radiation.  . CHF (congestive heart failure) ([place])    On Monday she said her cardiologist was concerned for possible CHF, but is now improving-per pt.(07/26/15)  . Coronary artery disease    a. STEMI 08/2012: s/p DES to LAD, PTCA to downstream LAD. b. Relook cath same hospitalization: stable post-PCI anatomy with Stable Diag lesions (unlikely cause of resting angina, not good PCI targets), stable RCA lesion (non flow limiting).  . Diabetes mellitus without complication ([place])    Takes insulin and metformin  . GERD (gastroesophageal reflux disease)    food related  . History of cancer chemotherapy    last in August 2014  . 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 History of radiation therapy 06/23/2013-08/08/2013   62.4 gray to left breast  . HTN (hypertension)   . Hypercholesteremia   . LV dysfunction    a. EF 45-50% at time of STEMI 08/2012. b.  Echo 04/2013: EF 45-50%, mild focal basal hypertrophy of septum, grade 1 d/d.  Marland Kitchen Myocardial infarction ([place]) 09/07/12  . Peripheral vision loss    left  . Renal artery stenosis ([facility])    a. 20% L RAS in 08/2012 by cath. b. Duplex 07/2012: >60% L RAS.  . Stroke, embolic ([place])    a. 37/8588 post cath.  . Vasovagal syncope    a. 04/2013 - echo stable compared to prior EF 45-50%, negative carotid duplex.    Past Surgical History:  Procedure Laterality  Date  . BACK SURGERY     x3, lower back  . BREAST BIOPSY Left 12/03/2012   U/S Core- Malignant  . BREAST LUMPECTOMY Left 01/08/2013  . BREAST LUMPECTOMY WITH NEEDLE LOCALIZATION AND AXILLARY SENTINEL LYMPH NODE BX Left 01/08/2013   Procedure: LEFT BREAST WIRE GUIDED  LUMPECTOMY AND LEFT AXILLARY SENTINEL  NODE BX;  Surgeon: Rolm Bookbinder, MD;  Location: [place];  Service: General;  Laterality: Left;  . CATARACT EXTRACTION Bilateral   . CORONARY ANGIOPLASTY WITH STENT PLACEMENT    . LEFT HEART CATHETERIZATION WITH CORONARY ANGIOGRAM N/A 09/07/2012   Procedure: LEFT HEART CATHETERIZATION WITH CORONARY ANGIOGRAM;  Surgeon: Troy Sine, MD;  Location: [facility] CATH LAB;  Service: Cardiovascular;  Laterality: N/A;  . LEFT HEART CATHETERIZATION WITH CORONARY ANGIOGRAM  09/09/2012   Procedure: LEFT HEART CATHETERIZATION WITH CORONARY ANGIOGRAM;  Surgeon: Leonie Man, MD;  Location: [facility] CATH LAB;  Service: Cardiovascular;;  . PERCUTANEOUS CORONARY STENT INTERVENTION (PCI-S) N/A 09/07/2012   Procedure: PERCUTANEOUS CORONARY STENT INTERVENTION (PCI-S);  Surgeon: Troy Sine, MD;  Location: [facility] CATH LAB;  Service: Cardiovascular;  Laterality: N/A;  . PORT-A-CATH REMOVAL N/A 10/14/2013   Procedure: REMOVAL PORT-A-CATH;  Surgeon: Rolm Bookbinder, MD;  Location: [facility] ORS;  Service: General;  Laterality: N/A;  . PORTACATH PLACEMENT Right 02/17/2013   Procedure: INSERTION PORT-A-CATH;  Surgeon: Rolm Bookbinder, MD;  Location: [facility];  Service: General;  Laterality: Right;    Social History:  reports that she has never smoked. She has never used smokeless tobacco. She reports current alcohol use. She reports that she does not use drugs.  Allergies: No Known Allergies  Medications Prior to Admission  Medication Sig Dispense Refill  . aspirin 81 MG EC tablet Take 1 tablet (81 mg total) by mouth daily. 30 tablet 12  . atorvastatin (LIPITOR) 80 MG tablet Take 1 tablet (80 mg total) by mouth daily. 30 tablet  11  . carvedilol (COREG) 25 MG tablet TAKE 1 TABLET(25 MG) BY MOUTH TWICE DAILY WITH A MEAL 60 tablet 11  . ezetimibe (ZETIA) 10 MG tablet TAKE 1 TABLET(10 MG) BY MOUTH DAILY 30 tablet 11  . furosemide (LASIX) 40 MG tablet Take 1 tablet (40 mg total) by mouth daily. 30 tablet 11  . Insulin Glargine (LANTUS SOLOSTAR) 100 UNIT/ML Solostar Pen Inject 18 units Nina at bedtime. 1 pen 3  . insulin lispro (HUMALOG) 100 UNIT/ML injection Inject 10-12 Units into the skin every morning.     . isosorbide mononitrate (IMDUR) 60 MG 24 hr tablet Take 1 tablet (60 mg total) by mouth daily. Please call and make an appt with Dr. Harrington Challenger for further refills. 30 tablet 11  . nitroGLYCERIN (NITROSTAT) 0.4 MG SL tablet Place 1 tablet (0.4 mg total) under the tongue every 5 (five) minutes as needed for chest pain. 25 tablet 11  . sacubitril-valsartan (ENTRESTO) 49-51 MG Take 1 tablet by mouth 2 (two) times daily. 180 tablet 3  .  spironolactone (ALDACTONE) 25 MG tablet Take 1 tablet (25 mg total) by mouth daily. 90 tablet 11  . glucose blood (ONETOUCH VERIO) test strip Use TID 100 each 12    Blood pressure 140/82, pulse 92, temperature 99.4 F (37.4 C), temperature source Oral, resp. rate 16, height _0  (1.651 m), weight 53.5 kg, SpO2 96 %. Physical Exam: Physical Exam Constitutional:      General: She is not in acute distress.    Appearance: She is well-developed and underweight. She is not toxic-appearing.  HENT:     Head: Normocephalic and atraumatic.     Right Ear: External ear normal.     Left Ear: External ear normal.     Nose: Nose normal.     Mouth/Throat:     Lips: Pink.  Eyes:     General: Lids are normal. No scleral icterus.    Extraocular Movements: Extraocular movements intact.     Conjunctiva/sclera: Conjunctivae normal.  Neck:     Musculoskeletal: Normal range of motion and neck supple.  Cardiovascular:     Rate and Rhythm: Normal rate and regular rhythm.     Pulses:          Radial pulses  are 2+ on the right side and 2+ on the left side.       Dorsalis pedis pulses are 2+ on the right side and 2+ on the left side.  Pulmonary:     Effort: Pulmonary effort is normal.     Breath sounds: Normal air entry. Rales (bilbasilar ) present. No wheezing or rhonchi.  Abdominal:     General: Bowel sounds are normal. There is no distension.     Palpations: Abdomen is soft.     Tenderness: There is no abdominal tenderness. There is no guarding.     Hernia: No hernia is present.  Genitourinary:    Comments: Perirectal swelling and induration with erythema extending to R gluteus, small area of fluctuance centrally, TTP  Musculoskeletal:     Comments: ROM grossly intact in all 4 extremities  Skin:    General: Skin is warm and dry.  Neurological:     Mental Status: She is alert and oriented to person, place, and time.  Psychiatric:        Attention and Perception: Attention normal.        Mood and Affect: Mood normal.        Speech: Speech normal.        Behavior: Behavior is cooperative.     Results for orders placed or performed during the hospital encounter of 12/10/18 (from the past 48 hour(s))  Basic metabolic panel     Status: Abnormal   Collection Time: 12/10/18  6:51 PM  Result Value Ref Range   Sodium 133 (L) 135 - 145 mmol/L   Potassium 3.7 3.5 - 5.1 mmol/L   Chloride 96 (L) 98 - 111 mmol/L   CO2 22 22 - 32 mmol/L   Glucose, Bld 311 (H) 70 - 99 mg/dL   BUN 14 8 - 23 mg/dL   Creatinine, Ser 0.67 0.44 - 1.00 mg/dL   Calcium 9.3 8.9 - 10.3 mg/dL   GFR calc non Af Amer >60 >60 mL/min   GFR calc Af Amer >60 >60 mL/min   Anion gap 15 5 - 15    Comment: Performed at [facility], [place] [address], [place], [place] [address]  CBC     Status: Abnormal   Collection Time: 12/10/18  6:51  PM  Result Value Ref Range   WBC 11.8 (H) 4.0 - 10.5 K/uL   RBC 5.17 (H) 3.87 - 5.11 MIL/uL   Hemoglobin 14.9 12.0 - 15.0 g/dL   HCT 45.6 36.0 - 46.0 %   MCV 88.2 80.0 -  100.0 fL   MCH 28.8 26.0 - 34.0 pg   MCHC 32.7 30.0 - 36.0 g/dL   RDW 13.9 11.5 - 15.5 %   Platelets 203 150 - 400 K/uL   nRBC 0.0 0.0 - 0.2 %    Comment: Performed at [facility], [place] [address] [place], [place] [address]  Urinalysis, Routine w reflex microscopic     Status: Abnormal   Collection Time: 12/10/18 10:38 PM  Result Value Ref Range   Color, Urine YELLOW YELLOW   APPearance CLOUDY (A) CLEAR   Specific Gravity, Urine 1.025 1.005 - 1.030   pH 5.0 5.0 - 8.0   Glucose, UA >=500 (A) NEGATIVE mg/dL   Hgb urine dipstick SMALL (A) NEGATIVE   Bilirubin Urine NEGATIVE NEGATIVE   Ketones, ur 20 (A) NEGATIVE mg/dL   Protein, ur 100 (A) NEGATIVE mg/dL   Nitrite NEGATIVE NEGATIVE   Leukocytes,Ua LARGE (A) NEGATIVE   RBC / HPF 11-20 0 - 5 RBC/hpf   WBC, UA 11-20 0 - 5 WBC/hpf   Bacteria, UA MANY (A) NONE SEEN   Squamous Epithelial / LPF 0-5 0 - 5   WBC Clumps PRESENT    Mucus PRESENT    Hyaline Casts, UA PRESENT     Comment: Performed at [facility], [place] [address], [place], [place] [address]  I-Stat Troponin, ED (not at [facility])     Status: None   Collection Time: 12/11/18  1:20 AM  Result Value Ref Range   Troponin i, poc 0.02 0.00 - 0.08 ng/mL   Comment 3            Comment: Due to the release kinetics of cTnI, a negative result within the first hours of the onset of symptoms does not rule out myocardial infarction with certainty. If myocardial infarction is still suspected, repeat the test at appropriate intervals.   Basic metabolic panel     Status: Abnormal   Collection Time: 12/11/18  6:05 AM  Result Value Ref Range   Sodium 129 (L) 135 - 145 mmol/L   Potassium 3.4 (L) 3.5 - 5.1 mmol/L   Chloride 93 (L) 98 - 111 mmol/L   CO2 20 (L) 22 - 32 mmol/L   Glucose, Bld 335 (H) 70 - 99 mg/dL   BUN 15 8 - 23 mg/dL   Creatinine, Ser 0.53 0.44 - 1.00 mg/dL   Calcium 8.5 (L) 8.9 - 10.3 mg/dL   GFR calc non Af Amer >60 >60 mL/min   GFR  calc Af Amer >60 >60 mL/min   Anion gap 16 (H) 5 - 15    Comment: Performed at [facility], [place] [address], [place], [place] [address]  CBC WITH DIFFERENTIAL     Status: Abnormal   Collection Time: 12/11/18  6:05 AM  Result Value Ref Range   WBC 9.1 4.0 - 10.5 K/uL   RBC 4.93 3.87 - 5.11 MIL/uL   Hemoglobin 13.7 12.0 - 15.0 g/dL   HCT 42.7 36.0 - 46.0 %   MCV 86.6 80.0 - 100.0 fL   MCH 27.8 26.0 - 34.0 pg   MCHC 32.1 30.0 - 36.0 g/dL   RDW 13.8 11.5 - 15.5 %  Platelets 181 150 - 400 K/uL   nRBC 0.0 0.0 - 0.2 %   Neutrophils Relative % 85 %   Neutro Abs 7.7 1.7 - 7.7 K/uL   Lymphocytes Relative 6 %   Lymphs Abs 0.6 (L) 0.7 - 4.0 K/uL   Monocytes Relative 9 %   Monocytes Absolute 0.8 0.1 - 1.0 K/uL   Eosinophils Relative 0 %   Eosinophils Absolute 0.0 0.0 - 0.5 K/uL   Basophils Relative 0 %   Basophils Absolute 0.0 0.0 - 0.1 K/uL   Immature Granulocytes 0 %   Abs Immature Granulocytes 0.03 0.00 - 0.07 K/uL    Comment: Performed at [facility], [place] [address], [place], [place] [address]  Hepatic function panel     Status: Abnormal   Collection Time: 12/11/18  6:05 AM  Result Value Ref Range   Total Protein 6.3 (L) 6.5 - 8.1 g/dL   Albumin 3.3 (L) 3.5 - 5.0 g/dL   AST 15 15 - 41 U/L   ALT 16 0 - 44 U/L   Alkaline Phosphatase 93 38 - 126 U/L   Total Bilirubin 1.4 (H) 0.3 - 1.2 mg/dL   Bilirubin, Direct 0.3 (H) 0.0 - 0.2 mg/dL   Indirect Bilirubin 1.1 (H) 0.3 - 0.9 mg/dL    Comment: Performed at [facility], [place] [address] [place], [place] [address]  Lactate dehydrogenase     Status: None   Collection Time: 12/11/18  6:05 AM  Result Value Ref Range   LDH 144 98 - 192 U/L    Comment: Performed at [facility], [place] [address], [place], [place] [address]   Ct Head Wo Contrast  Result Date: 12/11/2018 CLINICAL DATA:  Possible recent stroke EXAM: CT HEAD WITHOUT CONTRAST TECHNIQUE: Contiguous axial  images were obtained from the base of the skull through the vertex without intravenous contrast. COMPARISON:  04/11/2015 FINDINGS: Brain: Encephalomalacia changes are noted in the right occipital lobe consistent with prior infarct. These changes are stable from prior exam. Scattered chronic white matter ischemic changes are seen. No findings to suggest acute hemorrhage, acute infarction or space-occupying mass lesion noted. Vascular: No hyperdense vessel or unexpected calcification. Skull: Normal. Negative for fracture or focal lesion. Sinuses/Orbits: No acute finding. Other: None. IMPRESSION: Chronic changes without acute abnormality. Electronically Signed   By: Inez Catalina M.D.   On: 12/11/2018 03:15   Ct Chest W Contrast  Result Date: 12/11/2018 CLINICAL DATA:  Rectal pain for 2 days. No bowel movement in 24 hours. 20 lb weight loss. Remote history of left breast cancer. EXAM: CT CHEST, ABDOMEN, AND PELVIS WITH CONTRAST TECHNIQUE: Multidetector CT imaging of the chest, abdomen and pelvis was performed following the standard protocol during bolus administration of intravenous contrast. CONTRAST:  142m ISOVUE-300 IOPAMIDOL (ISOVUE-300) INJECTION 61% COMPARISON:  CT 07/29/2015 FINDINGS: CT CHEST FINDINGS Cardiovascular: Heart is mildly enlarged. Moderate coronary artery calcifications and scattered aortic calcifications. No evidence of aneurysm. Mediastinum/Nodes: No mediastinal, hilar, or axillary adenopathy. Lungs/Pleura: Moderate bilateral pleural effusions. Areas of atelectasis in the lower lobes bilaterally. Scarring in the lingula. No acute confluent airspace opacities or suspicious pulmonary nodules. Musculoskeletal: Chest wall soft tissues are unremarkable. No acute bony abnormality. CT ABDOMEN PELVIS FINDINGS Hepatobiliary: No focal hepatic abnormality. Gallbladder unremarkable. Pancreas: No focal abnormality or ductal dilatation. Spleen: No focal abnormality.  Normal size. Adrenals/Urinary Tract: 5.8  x 4.8 x 4.5 cm mixed density mass off the lower pole of the left kidney. This has  enlarged slightly when compared to prior study when this measured 5.1 x 4.5 x 4.4 cm. Cannot exclude slowly growing renal neoplasm. No hydronephrosis. Adrenal glands unremarkable. Gas noted within the urinary bladder, presumably from recent catheterization. No bladder wall abnormality. Stomach/Bowel: There appears to be circumferential wall thickening noted in the region of the anus/lower rectum. There also appears to be fluid collection posterior to the rectum on image 124 concerning for perirectal abscess. This measures up to 3.3 cm. This also extends to the right of the rectum/anus. No evidence of bowel obstruction. Moderate stool burden in the colon. Vascular/Lymphatic: Aortic atherosclerosis. No enlarged abdominal or pelvic lymph nodes. Reproductive: Uterus and adnexa unremarkable.  No mass. Other: No free fluid or free air. Musculoskeletal: No acute bony abnormality. IMPRESSION: Circumferential wall thickening in the region of the rectum/anus concerning for proctitis. Fluid collection noted posterior and to the right of the lower rectum and anus concerning for perirectal abscess. Mixed density mass noted off the lower pole of the left kidney measuring up to 5.8 cm, slowly enlarging since 2014 when this measured up to 5.1 cm. Cannot exclude slowly enlarging renal cell neoplasm. Recommend urologic consultation. Moderate bilateral pleural effusions. Compressive atelectasis in the lower lobes. Cardiomegaly, coronary artery disease. Electronically Signed   By: Rolm Baptise M.D.   On: 12/11/2018 03:39   Ct Abdomen Pelvis W Contrast  Result Date: 12/11/2018 CLINICAL DATA:  Rectal pain for 2 days. No bowel movement in 24 hours. 20 lb weight loss. Remote history of left breast cancer. EXAM: CT CHEST, ABDOMEN, AND PELVIS WITH CONTRAST TECHNIQUE: Multidetector CT imaging of the chest, abdomen and pelvis was performed following the  standard protocol during bolus administration of intravenous contrast. CONTRAST:  13m ISOVUE-300 IOPAMIDOL (ISOVUE-300) INJECTION 61% COMPARISON:  CT 07/29/2015 FINDINGS: CT CHEST FINDINGS Cardiovascular: Heart is mildly enlarged. Moderate coronary artery calcifications and scattered aortic calcifications. No evidence of aneurysm. Mediastinum/Nodes: No mediastinal, hilar, or axillary adenopathy. Lungs/Pleura: Moderate bilateral pleural effusions. Areas of atelectasis in the lower lobes bilaterally. Scarring in the lingula. No acute confluent airspace opacities or suspicious pulmonary nodules. Musculoskeletal: Chest wall soft tissues are unremarkable. No acute bony abnormality. CT ABDOMEN PELVIS FINDINGS Hepatobiliary: No focal hepatic abnormality. Gallbladder unremarkable. Pancreas: No focal abnormality or ductal dilatation. Spleen: No focal abnormality.  Normal size. Adrenals/Urinary Tract: 5.8 x 4.8 x 4.5 cm mixed density mass off the lower pole of the left kidney. This has enlarged slightly when compared to prior study when this measured 5.1 x 4.5 x 4.4 cm. Cannot exclude slowly growing renal neoplasm. No hydronephrosis. Adrenal glands unremarkable. Gas noted within the urinary bladder, presumably from recent catheterization. No bladder wall abnormality. Stomach/Bowel: There appears to be circumferential wall thickening noted in the region of the anus/lower rectum. There also appears to be fluid collection posterior to the rectum on image 124 concerning for perirectal abscess. This measures up to 3.3 cm. This also extends to the right of the rectum/anus. No evidence of bowel obstruction. Moderate stool burden in the colon. Vascular/Lymphatic: Aortic atherosclerosis. No enlarged abdominal or pelvic lymph nodes. Reproductive: Uterus and adnexa unremarkable.  No mass. Other: No free fluid or free air. Musculoskeletal: No acute bony abnormality. IMPRESSION: Circumferential wall thickening in the region of the  rectum/anus concerning for proctitis. Fluid collection noted posterior and to the right of the lower rectum and anus concerning for perirectal abscess. Mixed density mass noted off the lower pole of the left kidney measuring up to 5.8 cm, slowly enlarging since  2014 when this measured up to 5.1 cm. Cannot exclude slowly enlarging renal cell neoplasm. Recommend urologic consultation. Moderate bilateral pleural effusions. Compressive atelectasis in the lower lobes. Cardiomegaly, coronary artery disease. Electronically Signed   By: Rolm Baptise M.D.   On: 12/11/2018 03:39      Assessment/Plan Insulin dependent type 2 diabetes mellitus - SSI, need to get better control glucose in the 300s this AM, will order A1c Chronic combined CHF - last echo with EF 45-50% CAD/Hx of MI with stent Hx of CVA HTN L renal mass - noted on CT, recommend urology consult Pleural effusions - IR was consulted for possible thoracentesis  Perirectal abscess with proctitis - CT shows wall thickening around rectum/anus, fluid collection to the R of rectum - WBC 9 from 11.8 on admission, afebrile - obvious area of cellulitis with central fluctuance - plan for I&D in the OR today   FEN: NPO, IVF VTE: SCDs ID: [id number] 2/12>>   [place] Re, [facility] Surgery 12/11/2018, 7:33 AM Pager: [phone] Consults: [phone]

## 2018-12-11 NOTE — ED Provider Notes (Signed)
[facility] DEPT Provider Note  CSN: [id number] Arrival date & time: 12/10/18 1750  Chief Complaint(s) Rectal Pain and multiple complaints  HPI Marisa Forbes is a 68 y.o. female with extensive past medical history listed below who presents to the emergency department with several days of perirectal pain and constipation.  Pain is worse with sitting and bowel movements.  No alleviating factors.  Denies any associated fevers or chills.  She did report 20 pound weight loss in the past 1 to 2 months.  Reports a prior history of breast cancer status post lumpectomy and chemoradiation.  Reports that her appetite is intact.  No nausea or vomiting.  No diarrhea.  No abdominal pain.  No urinary symptoms.  Family does report that the patient has been increasingly fatigued over the past 1 to 2 weeks.  HPI  Past Medical History Past Medical History:  Diagnosis Date  . Arthritis    back  . Balance problems    due to stroke  . Breast cancer ([place]) 12/03/12   a. 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 S/p chemo with CMF and radiation.  . CHF (congestive heart failure) ([place])    On Monday she said her cardiologist was concerned for possible CHF, but is now improving-per pt.(07/26/15)  . Coronary artery disease    a. STEMI 08/2012: s/p DES to LAD, PTCA to downstream LAD. b. Relook cath same hospitalization: stable post-PCI anatomy with Stable Diag lesions (unlikely cause of resting angina, not good PCI targets), stable RCA lesion (non flow limiting).  . Diabetes mellitus without complication ([place])    Takes insulin and metformin  . GERD (gastroesophageal reflux disease)    food related  . History of cancer chemotherapy    last in August 2014  . 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 History of radiation therapy 06/23/2013-08/08/2013   62.4 gray to left breast  . HTN (hypertension)   . Hypercholesteremia   . LV dysfunction    a. EF 45-50% at time of STEMI 08/2012. b. Echo 04/2013: EF 45-50%, mild focal basal hypertrophy of septum, grade 1 d/d.  Marland Kitchen Myocardial infarction ([place]) 09/07/12  . Peripheral vision loss    left  . Renal artery stenosis ([facility])    a. 20% L RAS in 08/2012 by cath. b. Duplex 07/2012: >60% L RAS.  . Stroke, embolic ([place])    a. 06/6282 post cath.  . Vasovagal syncope    a. 04/2013 - echo stable compared to prior EF 45-50%, negative carotid duplex.   Patient Active Problem List   Diagnosis Date Noted  . Perirectal abscess 12/11/2018  . Left renal mass 12/11/2018  . Pleural effusion 12/11/2018  . Chronic combined systolic (congestive) and diastolic (congestive) heart failure ([place]) 01/22/2018  . Elevated troponin 12/22/2016  . CAP (community acquired pneumonia) 12/22/2016  . HCAP (healthcare-associated pneumonia) 12/22/2016  . Chest pain, rule out acute myocardial infarction 07/29/2015  . 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 DM (diabetes mellitus), type 2 ([place])   . Ischemic cardiomyopathy   . Congestive dilated cardiomyopathy ([place]) 04/13/2015  . 3rd cranial nerve palsy   . Diplopia   . CVA (cerebral infarction) 04/11/2015  . Renal artery stenosis ([place])   . LV dysfunction   . Benign paroxysmal positional vertigo 05/19/2013  . Cancer of upper-inner  quadrant of female breast ([place]) 12/06/2012  . Cervical dystonia 10/18/2012  . Peripheral neuropathy 10/18/2012  . Other screening mammogram 10/03/2012  . History of completed stroke 09/12/2012    Class: Acute  . Hyperlipidemia with target LDL less than 70 09/09/2012  . CAD (coronary artery disease) 09/09/2012  . NSVT, 5 beats 11/10 09/09/2012  . Hypertensive heart disease with CHF (congestive heart failure) ([place]) 09/07/2012   Home Medication(s) Prior to Admission medications     Medication Sig Start Date End Date Taking? Authorizing Provider  aspirin 81 MG EC tablet Take 1 tablet (81 mg total) by mouth daily. 04/16/15  Yes Barton Dubois, MD  atorvastatin (LIPITOR) 80 MG tablet Take 1 tablet (80 mg total) by mouth daily. 04/26/18 04/21/19 Yes Fay Records, MD  carvedilol (COREG) 25 MG tablet TAKE 1 TABLET(25 MG) BY MOUTH TWICE DAILY WITH A MEAL 04/26/18  Yes Fay Records, MD  ezetimibe (ZETIA) 10 MG tablet TAKE 1 TABLET(10 MG) BY MOUTH DAILY 04/26/18  Yes Fay Records, MD  furosemide (LASIX) 40 MG tablet Take 1 tablet (40 mg total) by mouth daily. 04/26/18 04/21/19 Yes Fay Records, MD  Insulin Glargine (LANTUS SOLOSTAR) 100 UNIT/ML Solostar Pen Inject 18 units [place] at bedtime. 04/16/15  Yes Barton Dubois, MD  insulin lispro (HUMALOG) 100 UNIT/ML injection Inject 10-12 Units into the skin every morning.    Yes [provider]  isosorbide mononitrate (IMDUR) 60 MG 24 hr tablet Take 1 tablet (60 mg total) by mouth daily. Please call and make an appt with Dr. Harrington Challenger for further refills. 04/26/18  Yes Fay Records, MD  nitroGLYCERIN (NITROSTAT) 0.4 MG SL tablet Place 1 tablet (0.4 mg total) under the tongue every 5 (five) minutes as needed for chest pain. 04/26/18  Yes Fay Records, MD  sacubitril-valsartan (ENTRESTO) 49-51 MG Take 1 tablet by mouth 2 (two) times daily. 05/06/18  Yes Fay Records, MD  spironolactone (ALDACTONE) 25 MG tablet Take 1 tablet (25 mg total) by mouth daily. 04/26/18  Yes Fay Records, MD  glucose blood [facility] VERIO) test strip Use TID 10/03/12   Janith Lima, MD                                                                                                                                    Past Surgical History Past Surgical History:  Procedure Laterality Date  . BACK SURGERY     x3, lower back  . BREAST BIOPSY Left 12/03/2012   U/S Core- Malignant  . BREAST LUMPECTOMY Left 01/08/2013  . BREAST LUMPECTOMY WITH NEEDLE LOCALIZATION AND  AXILLARY SENTINEL LYMPH NODE BX Left 01/08/2013   Procedure: LEFT BREAST WIRE GUIDED  LUMPECTOMY AND LEFT AXILLARY SENTINEL  NODE BX;  Surgeon: Rolm Bookbinder, MD;  Location: [facility];  Service: General;  Laterality: Left;  . CATARACT EXTRACTION Bilateral   . CORONARY ANGIOPLASTY WITH STENT PLACEMENT    .  LEFT HEART CATHETERIZATION WITH CORONARY ANGIOGRAM N/A 09/07/2012   Procedure: LEFT HEART CATHETERIZATION WITH CORONARY ANGIOGRAM;  Surgeon: Troy Sine, MD;  Location[facility] CATH LAB;  Service: Cardiovascular;  Laterality: N/A;  . LEFT HEART CATHETERIZATION WITH CORONARY ANGIOGRAM  09/09/2012   Procedure: LEFT HEART CATHETERIZATION WITH CORONARY ANGIOGRAM;  Surgeon: Leonie Man, MD;  Location[facility] CATH LAB;  Service: Cardiovascular;;  . PERCUTANEOUS CORONARY STENT INTERVENTION (PCI-S) N/A 09/07/2012   Procedure: PERCUTANEOUS CORONARY STENT INTERVENTION (PCI-S);  Surgeon: Troy Sine, MD;  Location: [facility] CATH LAB;  Service: Cardiovascular;  Laterality: N/A;  . PORT-A-CATH REMOVAL N/A 10/14/2013   Procedure: REMOVAL PORT-A-CATH;  Surgeon: Rolm Bookbinder, MD;  Location: [facility] ORS;  Service: General;  Laterality: N/A;  . PORTACATH PLACEMENT Right 02/17/2013   Procedure: INSERTION PORT-A-CATH;  Surgeon: Rolm Bookbinder, MD;  Location: [facility];  Service: General;  Laterality: Right;   Family History Family History  Problem Relation Age of Onset  . Heart disease Father   . Hypertension Father   . Cancer Neg Hx   . Diabetes Neg Hx   . Hyperlipidemia Neg Hx   . Kidney disease Neg Hx   . Stroke Neg Hx     Social History Social History   Tobacco Use  . Smoking status: Never Smoker  . Smokeless tobacco: Never Used  Substance Use Topics  . Alcohol use: Yes    Comment: socially  . Drug use: No   Allergies Patient has no known allergies.  Review of Systems Review of Systems All other systems are reviewed and are negative for acute change except as noted in the HPI  Physical  Exam Vital Signs  I have reviewed the triage vital signs BP (!) 161/90   Pulse 92   Temp 98.9 F (37.2 C) (Oral)   Resp 20   Ht 5' 5"  (1.651 m)   Wt 53.5 kg   SpO2 95%   BMI 19.64 kg/m   Physical Exam Vitals signs reviewed.  Constitutional:      General: She is not in acute distress.    Appearance: She is well-developed. She is cachectic. She is not diaphoretic.  HENT:     Head: Normocephalic and atraumatic.     Nose: Nose normal.  Eyes:     General: No scleral icterus.       Right eye: No discharge.        Left eye: No discharge.     Conjunctiva/sclera: Conjunctivae normal.     Pupils: Pupils are equal, round, and reactive to light.  Neck:     Musculoskeletal: Normal range of motion and neck supple.  Cardiovascular:     Rate and Rhythm: Normal rate and regular rhythm.     Heart sounds: No murmur. No friction rub. No gallop.   Pulmonary:     Effort: Pulmonary effort is normal. No respiratory distress.     Breath sounds: Normal breath sounds. No stridor. No rales.  Abdominal:     General: There is no distension.     Palpations: Abdomen is soft.     Tenderness: There is no abdominal tenderness.  Genitourinary:    Rectum: Mass (to right perianal region. TTP with underlying fluctuance), tenderness and external hemorrhoid present.  Musculoskeletal:        General: No tenderness.  Skin:    General: Skin is warm and dry.     Findings: No erythema or rash.  Neurological:     Mental Status: She is alert and  oriented to person, place, and time.     ED Results and Treatments Labs (all labs ordered are listed, but only abnormal results are displayed) Labs Reviewed  BASIC METABOLIC PANEL - Abnormal; Notable for the following components:      Result Value   Sodium 133 (*)    Chloride 96 (*)    Glucose, Bld 311 (*)    All other components within normal limits  CBC - Abnormal; Notable for the following components:   WBC 11.8 (*)    RBC 5.17 (*)    All other components  within normal limits  URINALYSIS, ROUTINE W REFLEX MICROSCOPIC - Abnormal; Notable for the following components:   APPearance CLOUDY (*)    Glucose, UA >=500 (*)    Hgb urine dipstick SMALL (*)    Ketones, ur 20 (*)    Protein, ur 100 (*)    Leukocytes,Ua LARGE (*)    Bacteria, UA MANY (*)    All other components within normal limits  URINE CULTURE  HIV ANTIBODY (ROUTINE TESTING W REFLEX)  BASIC METABOLIC PANEL  CBC WITH DIFFERENTIAL/PLATELET  HEPATIC FUNCTION PANEL  I-STAT TROPONIN, ED                                                                                                                         EKG  EKG Interpretation  Date/Time:  Tuesday December 10 2018 19:09:53 EST Ventricular Rate:  84 PR Interval:    QRS Duration: 110 QT Interval:  410 QTC Calculation: 485 R Axis:   -75 Text Interpretation:  Sinus rhythm LAD, consider left anterior fascicular block Anterior infarct, old Minimal ST depression, lateral leads Artifact Confirmed by Addison Lank [phone]) on 12/10/2018 11:55:41 PM      Radiology Ct Head Wo Contrast  Result Date: 12/11/2018 CLINICAL DATA:  Possible recent stroke EXAM: CT HEAD WITHOUT CONTRAST TECHNIQUE: Contiguous axial images were obtained from the base of the skull through the vertex without intravenous contrast. COMPARISON:  04/11/2015 FINDINGS: Brain: Encephalomalacia changes are noted in the right occipital lobe consistent with prior infarct. These changes are stable from prior exam. Scattered chronic white matter ischemic changes are seen. No findings to suggest acute hemorrhage, acute infarction or space-occupying mass lesion noted. Vascular: No hyperdense vessel or unexpected calcification. Skull: Normal. Negative for fracture or focal lesion. Sinuses/Orbits: No acute finding. Other: None. IMPRESSION: Chronic changes without acute abnormality. Electronically Signed   By: Inez Catalina M.D.   On: 12/11/2018 03:15   Ct Chest W Contrast  Result Date:  12/11/2018 CLINICAL DATA:  Rectal pain for 2 days. No bowel movement in 24 hours. 20 lb weight loss. Remote history of left breast cancer. EXAM: CT CHEST, ABDOMEN, AND PELVIS WITH CONTRAST TECHNIQUE: Multidetector CT imaging of the chest, abdomen and pelvis was performed following the standard protocol during bolus administration of intravenous contrast. CONTRAST:  181m ISOVUE-300 IOPAMIDOL (ISOVUE-300) INJECTION 61% COMPARISON:  CT 07/29/2015 FINDINGS: CT CHEST FINDINGS Cardiovascular: Heart is mildly enlarged.  Moderate coronary artery calcifications and scattered aortic calcifications. No evidence of aneurysm. Mediastinum/Nodes: No mediastinal, hilar, or axillary adenopathy. Lungs/Pleura: Moderate bilateral pleural effusions. Areas of atelectasis in the lower lobes bilaterally. Scarring in the lingula. No acute confluent airspace opacities or suspicious pulmonary nodules. Musculoskeletal: Chest wall soft tissues are unremarkable. No acute bony abnormality. CT ABDOMEN PELVIS FINDINGS Hepatobiliary: No focal hepatic abnormality. Gallbladder unremarkable. Pancreas: No focal abnormality or ductal dilatation. Spleen: No focal abnormality.  Normal size. Adrenals/Urinary Tract: 5.8 x 4.8 x 4.5 cm mixed density mass off the lower pole of the left kidney. This has enlarged slightly when compared to prior study when this measured 5.1 x 4.5 x 4.4 cm. Cannot exclude slowly growing renal neoplasm. No hydronephrosis. Adrenal glands unremarkable. Gas noted within the urinary bladder, presumably from recent catheterization. No bladder wall abnormality. Stomach/Bowel: There appears to be circumferential wall thickening noted in the region of the anus/lower rectum. There also appears to be fluid collection posterior to the rectum on image 124 concerning for perirectal abscess. This measures up to 3.3 cm. This also extends to the right of the rectum/anus. No evidence of bowel obstruction. Moderate stool burden in the colon.  Vascular/Lymphatic: Aortic atherosclerosis. No enlarged abdominal or pelvic lymph nodes. Reproductive: Uterus and adnexa unremarkable.  No mass. Other: No free fluid or free air. Musculoskeletal: No acute bony abnormality. IMPRESSION: Circumferential wall thickening in the region of the rectum/anus concerning for proctitis. Fluid collection noted posterior and to the right of the lower rectum and anus concerning for perirectal abscess. Mixed density mass noted off the lower pole of the left kidney measuring up to 5.8 cm, slowly enlarging since 2014 when this measured up to 5.1 cm. Cannot exclude slowly enlarging renal cell neoplasm. Recommend urologic consultation. Moderate bilateral pleural effusions. Compressive atelectasis in the lower lobes. Cardiomegaly, coronary artery disease. Electronically Signed   By: Rolm Baptise M.D.   On: 12/11/2018 03:39   Ct Abdomen Pelvis W Contrast  Result Date: 12/11/2018 CLINICAL DATA:  Rectal pain for 2 days. No bowel movement in 24 hours. 20 lb weight loss. Remote history of left breast cancer. EXAM: CT CHEST, ABDOMEN, AND PELVIS WITH CONTRAST TECHNIQUE: Multidetector CT imaging of the chest, abdomen and pelvis was performed following the standard protocol during bolus administration of intravenous contrast. CONTRAST:  162m ISOVUE-300 IOPAMIDOL (ISOVUE-300) INJECTION 61% COMPARISON:  CT 07/29/2015 FINDINGS: CT CHEST FINDINGS Cardiovascular: Heart is mildly enlarged. Moderate coronary artery calcifications and scattered aortic calcifications. No evidence of aneurysm. Mediastinum/Nodes: No mediastinal, hilar, or axillary adenopathy. Lungs/Pleura: Moderate bilateral pleural effusions. Areas of atelectasis in the lower lobes bilaterally. Scarring in the lingula. No acute confluent airspace opacities or suspicious pulmonary nodules. Musculoskeletal: Chest wall soft tissues are unremarkable. No acute bony abnormality. CT ABDOMEN PELVIS FINDINGS Hepatobiliary: No focal hepatic  abnormality. Gallbladder unremarkable. Pancreas: No focal abnormality or ductal dilatation. Spleen: No focal abnormality.  Normal size. Adrenals/Urinary Tract: 5.8 x 4.8 x 4.5 cm mixed density mass off the lower pole of the left kidney. This has enlarged slightly when compared to prior study when this measured 5.1 x 4.5 x 4.4 cm. Cannot exclude slowly growing renal neoplasm. No hydronephrosis. Adrenal glands unremarkable. Gas noted within the urinary bladder, presumably from recent catheterization. No bladder wall abnormality. Stomach/Bowel: There appears to be circumferential wall thickening noted in the region of the anus/lower rectum. There also appears to be fluid collection posterior to the rectum on image 124 concerning for perirectal abscess. This measures up to 3.3  cm. This also extends to the right of the rectum/anus. No evidence of bowel obstruction. Moderate stool burden in the colon. Vascular/Lymphatic: Aortic atherosclerosis. No enlarged abdominal or pelvic lymph nodes. Reproductive: Uterus and adnexa unremarkable.  No mass. Other: No free fluid or free air. Musculoskeletal: No acute bony abnormality. IMPRESSION: Circumferential wall thickening in the region of the rectum/anus concerning for proctitis. Fluid collection noted posterior and to the right of the lower rectum and anus concerning for perirectal abscess. Mixed density mass noted off the lower pole of the left kidney measuring up to 5.8 cm, slowly enlarging since 2014 when this measured up to 5.1 cm. Cannot exclude slowly enlarging renal cell neoplasm. Recommend urologic consultation. Moderate bilateral pleural effusions. Compressive atelectasis in the lower lobes. Cardiomegaly, coronary artery disease. Electronically Signed   By: Rolm Baptise M.D.   On: 12/11/2018 03:39   Pertinent labs & imaging results that were available during my care of the patient were reviewed by me and considered in my medical decision making (see chart for  details).  Medications Ordered in ED Medications  iopamidol (ISOVUE-300) 61 % injection (has no administration in time range)  sodium chloride (PF) 0.9 % injection (has no administration in time range)  metroNIDAZOLE (FLAGYL) IVPB 500 mg (has no administration in time range)  insulin glargine (LANTUS) injection 9 Units (has no administration in time range)  insulin aspart (novoLOG) injection 0-9 Units (has no administration in time range)  0.9 %  sodium chloride infusion (has no administration in time range)  acetaminophen (TYLENOL) tablet 650 mg (has no administration in time range)    Or  acetaminophen (TYLENOL) suppository 650 mg (has no administration in time range)  HYDROcodone-acetaminophen (NORCO/VICODIN) 5-325 MG per tablet 1 tablet (has no administration in time range)  ondansetron (ZOFRAN) tablet 4 mg (has no administration in time range)    Or  ondansetron (ZOFRAN) injection 4 mg (has no administration in time range)  hydrALAZINE (APRESOLINE) injection 10 mg (has no administration in time range)  cefTRIAXone (ROCEPHIN) 1 g in sodium chloride 0.9 % 100 mL IVPB (has no administration in time range)  metroNIDAZOLE (FLAGYL) IVPB 500 mg (has no administration in time range)  sodium chloride 0.9 % bolus 1,000 mL (1,000 mLs Intravenous New Bag/Given 12/11/18 0131)  cefTRIAXone (ROCEPHIN) 1 g in sodium chloride 0.9 % 100 mL IVPB (0 g Intravenous Stopped 12/11/18 0216)  iohexol (OMNIPAQUE) 300 MG/ML solution 30 mL (30 mLs Oral Contrast Given 12/11/18 0108)  iopamidol (ISOVUE-300) 61 % injection 100 mL (100 mLs Intravenous Contrast Given 12/11/18 0256)                                                                                                                                    Procedures Procedures EMERGENCY DEPARTMENT US SOFT TISSUE INTERPRETATION "Study: Limited Soft Tissue Ultrasound"  INDICATIONS: Pain Multiple views of the body part were obtained in real-time with a  multi-frequency linear probe  PERFORMED BY: Myself IMAGES ARCHIVED?: Yes SIDE:Right  BODY PART:perianal INTERPRETATION:  fluid filled area with cobbletoning; suspicious for abscess    (including critical care time)  Medical Decision Making / ED Course I have reviewed the nursing notes for this encounter and the patient's prior records (if available in EHR or on provided paperwork).    1.  Rectal abscess. Patient has a large external hemorrhoid with area of fluctuance on the right perianal region.  On DRE, patient had significant tenderness to palpation.  CT scan obtained and notable for proctitis with right/posterior perianal and perirectal abscess.  Work-up also notable for urinary tract infection.  Patient treated with Rocephin and Flagyl.  Case discussed with general surgery who see the patient in consult.  2.  20 pound weight loss and fatigue CT scans also revealed a left nephric mass which has increased in size since 2016.  This is concerning for possible neoplasm.  Patient admitted to medicine for continued work-up and management.  Final Clinical Impression(s) / ED Diagnoses Final diagnoses:  Perirectal abscess  Proctitis  Left renal mass  Lower urinary tract infectious disease      This chart was dictated using voice recognition software.  Despite best efforts to proofread,  errors can occur which can change the documentation meaning.   Fatima Blank, MD 12/11/18 [phone]

## 2018-12-11 NOTE — Transfer of Care (Signed)
Immediate Anesthesia Transfer of Care Note  Patient: KSENIA KUNZ  Procedure(s) Performed: IRRIGATION AND DEBRIDEMENT PERIRECTAL ABSCESS (N/A )  Patient Location: PACU  Anesthesia Type:General  Level of Consciousness: awake, sedated and patient cooperative  Airway & Oxygen Therapy: Patient Spontanous Breathing and Patient connected to face mask oxygen  Post-op Assessment: Report given to RN, Post -op Vital signs reviewed and stable and Patient moving all extremities X 4  Post vital signs: stable  Last Vitals:  Vitals Value Taken Time  BP 115/73 12/11/2018  1:32 PM  Temp 36.4 C 12/11/2018  1:32 PM  Pulse 75 12/11/2018  1:45 PM  Resp 13 12/11/2018  1:45 PM  SpO2 100 % 12/11/2018  1:45 PM  Vitals shown include unvalidated device data.  Last Pain:  Vitals:   12/11/18 1332  TempSrc:   PainSc: Asleep         Complications: No apparent anesthesia complications

## 2018-12-11 NOTE — Plan of Care (Signed)
  Problem: Activity: Goal: Ability to return to normal activity level will improve to the fullest extent possible by discharge Outcome: Progressing   Problem: Education: Goal: Knowledge of medication regimen will be met for pain relief regimen by discharge Outcome: Progressing Goal: Understanding of ways to prevent infection will improve by discharge Outcome: Progressing   Problem: Education: Goal: Understanding of ways to prevent infection will improve by discharge Outcome: Progressing   Problem: Coping: Goal: Family members realistic understanding of the patients condition will improve by discharge Outcome: Progressing

## 2018-12-11 NOTE — Anesthesia Preprocedure Evaluation (Signed)
Anesthesia Evaluation  Patient identified by MRN, date of birth, ID band Patient awake    Reviewed: Allergy & Precautions, NPO status , Patient's Chart, lab work & pertinent test results  Airway Mallampati: I  TM Distance: >3 FB Neck ROM: Full    Dental   Pulmonary    Pulmonary exam normal        Cardiovascular hypertension, + CAD, + Past MI and + Cardiac Stents  Normal cardiovascular exam     Neuro/Psych CVA    GI/Hepatic GERD  Medicated and Controlled,  Endo/Other  diabetes, Type 2, Insulin Dependent  Renal/GU      Musculoskeletal   Abdominal   Peds  Hematology   Anesthesia Other Findings   Reproductive/Obstetrics                             Anesthesia Physical Anesthesia Plan  ASA: III  Anesthesia Plan: General   Post-op Pain Management:    Induction: Intravenous, Rapid sequence and Cricoid pressure planned  PONV Risk Score and Plan: 3 and Ondansetron, Midazolam and Treatment may vary due to age or medical condition  Airway Management Planned: Oral ETT  Additional Equipment:   Intra-op Plan:   Post-operative Plan: Extubation in OR  Informed Consent: I have reviewed the patients History and Physical, chart, labs and discussed the procedure including the risks, benefits and alternatives for the proposed anesthesia with the patient or authorized representative who has indicated his/her understanding and acceptance.       Plan Discussed with: CRNA and Surgeon  Anesthesia Plan Comments:         Anesthesia Quick Evaluation

## 2018-12-11 NOTE — H&P (Signed)
History and Physical    Marisa Forbes DJM:[id number] DOB: 08/29/1951 DOA: 12/10/2018  PCP: Patient, No Pcp Per   Patient coming from: Home   Chief Complaint: Rectal pain, wt loss, decreased appetite   HPI: Marisa Forbes is a 68 y.o. female with medical history significant for insulin-dependent diabetes mellitus, chronic combined systolic and diastolic CHF, history of CVA, history of breast cancer status post lumpectomy followed by chemotherapy and radiation, and coronary artery disease, now presenting to the emergency department for evaluation of rectal pain, decreased appetite, and unintentional weight loss.  Patient reports insidious development of loss of appetite and general malaise over the past couple months.  She reports losing approximately 20 pounds over this interval.  She has also developed worsening rectal pain over the past 2 days and reports a swelling around her anus.  She denies any significant shortness of breath or cough, denies chest pain, and has not noted any fevers or chills.  She denies any gross hematuria.  ED Course: Upon arrival to the ED, patient is found to be afebrile, saturating well on room air, and with vitals otherwise stable.  EKG features a sinus rhythm with LAD and possible minimal lateral ST depressions.  Noncontrast head CT is negative for acute intracranial abnormality.  CT chest reveals moderate pleural effusions bilaterally with compressive atelectasis at the bases.  CT abdomen and pelvis is concerning for circumferential thickening of rectum and anus concerning for proctitis, as well as fluid collection adjacent to the anus suggestive of perirectal abscess and a left renal mass.  Chemistry panel features a glucose of 311 with normal bicarbonate and anion gap.  CBC is notable for mild leukocytosis.  Troponin is normal.  Patient was given a liter of normal saline, Rocephin, and Flagyl in the emergency department.  General surgery was consulted by the ED  physician and recommended medical admission.  Review of Systems:  All other systems reviewed and apart from HPI, are negative.  Past Medical History:  Diagnosis Date  . Arthritis    back  . Balance problems    due to stroke  . Breast cancer ([place]) 12/03/12   a. 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 S/p chemo with CMF and radiation.  . CHF (congestive heart failure) ([place])    On Monday she said her cardiologist was concerned for possible CHF, but is now improving-per pt.(07/26/15)  . Coronary artery disease    a. STEMI 08/2012: s/p DES to LAD, PTCA to downstream LAD. b. Relook cath same hospitalization: stable post-PCI anatomy with Stable Diag lesions (unlikely cause of resting angina, not good PCI targets), stable RCA lesion (non flow limiting).  . Diabetes mellitus without complication ([place])    Takes insulin and metformin  . GERD (gastroesophageal reflux disease)    food related  . History of cancer chemotherapy    last in August 2014  . 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 History of radiation therapy 06/23/2013-08/08/2013   62.4 gray to left breast  . HTN (hypertension)   . Hypercholesteremia   . LV dysfunction    a. EF 45-50% at time of STEMI 08/2012. b. Echo 04/2013: EF 45-50%, mild focal basal hypertrophy of septum, grade 1 d/d.  Marland Kitchen Myocardial infarction ([place]) 09/07/12  . Peripheral vision loss    left  . Renal artery  stenosis ([place])    a. 20% L RAS in 08/2012 by cath. b. Duplex 07/2012: >60% L RAS.  . Stroke, embolic ([place])    a. 22/4825 post cath.  . Vasovagal syncope    a. 04/2013 - echo stable compared to prior EF 45-50%, negative carotid duplex.    Past Surgical History:  Procedure Laterality Date  . BACK SURGERY     x3, lower back  . BREAST BIOPSY Left 12/03/2012     U/S Core- Malignant  . BREAST LUMPECTOMY Left 01/08/2013  . BREAST LUMPECTOMY WITH NEEDLE LOCALIZATION AND AXILLARY SENTINEL LYMPH NODE BX Left 01/08/2013   Procedure: LEFT BREAST WIRE GUIDED  LUMPECTOMY AND LEFT AXILLARY SENTINEL  NODE BX;  Surgeon: Rolm Bookbinder, MD;  Location: [place];  Service: General;  Laterality: Left;  . CATARACT EXTRACTION Bilateral   . CORONARY ANGIOPLASTY WITH STENT PLACEMENT    . LEFT HEART CATHETERIZATION WITH CORONARY ANGIOGRAM N/A 09/07/2012   Procedure: LEFT HEART CATHETERIZATION WITH CORONARY ANGIOGRAM;  Surgeon: Troy Sine, MD;  Location: [facility] CATH LAB;  Service: Cardiovascular;  Laterality: N/A;  . LEFT HEART CATHETERIZATION WITH CORONARY ANGIOGRAM  09/09/2012   Procedure: LEFT HEART CATHETERIZATION WITH CORONARY ANGIOGRAM;  Surgeon: Leonie Man, MD;  Location: [facility] CATH LAB;  Service: Cardiovascular;;  . PERCUTANEOUS CORONARY STENT INTERVENTION (PCI-S) N/A 09/07/2012   Procedure: PERCUTANEOUS CORONARY STENT INTERVENTION (PCI-S);  Surgeon: Troy Sine, MD;  Location: [facility] CATH LAB;  Service: Cardiovascular;  Laterality: N/A;  . PORT-A-CATH REMOVAL N/A 10/14/2013   Procedure: REMOVAL PORT-A-CATH;  Surgeon: Rolm Bookbinder, MD;  Location: [facility];  Service: General;  Laterality: N/A;  . PORTACATH PLACEMENT Right 02/17/2013   Procedure: INSERTION PORT-A-CATH;  Surgeon: Rolm Bookbinder, MD;  Location: [place];  Service: General;  Laterality: Right;     reports that she has never smoked. She has never used smokeless tobacco. She reports current alcohol use. She reports that she does not use drugs.  No Known Allergies  Family History  Problem Relation Age of Onset  . Heart disease Father   . Hypertension Father   . Cancer Neg Hx   . Diabetes Neg Hx   . Hyperlipidemia Neg Hx   . Kidney disease Neg Hx   . Stroke Neg Hx      Prior to Admission medications   Medication Sig Start Date End Date Taking? Authorizing Provider  aspirin 81 MG EC tablet  Take 1 tablet (81 mg total) by mouth daily. 04/16/15  Yes Barton Dubois, MD  atorvastatin (LIPITOR) 80 MG tablet Take 1 tablet (80 mg total) by mouth daily. 04/26/18 04/21/19 Yes Fay Records, MD  carvedilol (COREG) 25 MG tablet TAKE 1 TABLET(25 MG) BY MOUTH TWICE DAILY WITH A MEAL 04/26/18  Yes Fay Records, MD  ezetimibe (ZETIA) 10 MG tablet TAKE 1 TABLET(10 MG) BY MOUTH DAILY 04/26/18  Yes Fay Records, MD  furosemide (LASIX) 40 MG tablet Take 1 tablet (40 mg total) by mouth daily. 04/26/18 04/21/19 Yes Fay Records, MD  Insulin Glargine (LANTUS SOLOSTAR) 100 UNIT/ML Solostar Pen Inject 18 units [place] at bedtime. 04/16/15  Yes Barton Dubois, MD  insulin lispro (HUMALOG) 100 UNIT/ML injection Inject 10-12 Units into the skin every morning.    Yes [provider]  isosorbide mononitrate (IMDUR) 60 MG 24 hr tablet Take 1 tablet (60 mg total) by mouth daily. Please call and make an appt with Dr. Harrington Challenger for further refills. 04/26/18  Yes Fay Records, MD  nitroGLYCERIN (NITROSTAT) 0.4 MG SL tablet Place 1 tablet (0.4 mg total) under the tongue every 5 (five) minutes as needed for chest pain. 04/26/18  Yes Fay Records, MD  sacubitril-valsartan (ENTRESTO) 49-51 MG Take 1 tablet by mouth 2 (two) times daily. 05/06/18  Yes Fay Records, MD  spironolactone (ALDACTONE) 25 MG tablet Take 1 tablet (25 mg total) by mouth daily. 04/26/18  Yes Fay Records, MD  glucose blood [facility] VERIO) test strip Use TID 10/03/12   Janith Lima, MD    Physical Exam: Vitals:   12/11/18 0100 12/11/18 0130 12/11/18 0200 12/11/18 0230  BP: (!) 158/101 (!) 162/108 (!) 167/99 (!) 161/90  Pulse: 92 82 92 92  Resp: 18 17 18 20   Temp:      TempSrc:      SpO2: 97% 97% 98% 95%  Weight:      Height:        Constitutional: NAD, calm  Eyes: PERTLA, lids and conjunctivae normal ENMT: Mucous membranes are moist. Posterior pharynx clear of any exudate or lesions.   Neck: normal, supple, no masses, no  thyromegaly Respiratory: clear to auscultation bilaterally, no wheezing, no crackles. Normal respiratory effort.    Cardiovascular: S1 & S2 heard, regular rate and rhythm. No extremity edema.   Abdomen: No distension, soft, non-tender. Bowel sounds active.  Musculoskeletal: no clubbing / cyanosis. No joint deformity upper and lower extremities.    Skin: no significant rashes, lesions, ulcers. Warm, dry, well-perfused. Neurologic: no facial asymmetry. Sensation intact. Moving all extremities.  Psychiatric: Alert and oriented x 3. Pleasant and cooperative.    Labs on Admission: I have personally reviewed following labs and imaging studies  CBC: Recent Labs  Lab 12/10/18 1851  WBC 11.8*  HGB 14.9  HCT 45.6  MCV 88.2  PLT 413   Basic Metabolic Panel: Recent Labs  Lab 12/10/18 1851  NA 133*  K 3.7  CL 96*  CO2 22  GLUCOSE 311*  BUN 14  CREATININE 0.67  CALCIUM 9.3   GFR: Estimated Creatinine Clearance: 57.6 mL/min (by C-G formula based on SCr of 0.67 mg/dL). Liver Function Tests: No results for input(s): AST, ALT, ALKPHOS, BILITOT, PROT, ALBUMIN in the last 168 hours. No results for input(s): LIPASE, AMYLASE in the last 168 hours. No results for input(s): AMMONIA in the last 168 hours. Coagulation Profile: No results for input(s): INR, PROTIME in the last 168 hours. Cardiac Enzymes: No results for input(s): CKTOTAL, CKMB, CKMBINDEX, TROPONINI in the last 168 hours. BNP (last 3 results) No results for input(s): PROBNP in the last 8760 hours. HbA1C: No results for input(s): HGBA1C in the last 72 hours. CBG: No results for input(s): GLUCAP in the last 168 hours. Lipid Profile: No results for input(s): CHOL, HDL, LDLCALC, TRIG, CHOLHDL, LDLDIRECT in the last 72 hours. Thyroid Function Tests: No results for input(s): TSH, T4TOTAL, FREET4, T3FREE, THYROIDAB in the last 72 hours. Anemia Panel: No results for input(s): VITAMINB12, FOLATE, FERRITIN, TIBC, IRON, RETICCTPCT in  the last 72 hours. Urine analysis:    Component Value Date/Time   COLORURINE YELLOW 12/10/2018 2238   APPEARANCEUR CLOUDY (A) 12/10/2018 2238   LABSPEC 1.025 12/10/2018 2238   PHURINE 5.0 12/10/2018 2238   GLUCOSEU >=500 (A) 12/10/2018 2238   GLUCOSEU >=1000 (A) 06/01/2015 0854   HGBUR SMALL (A) 12/10/2018 2238   BILIRUBINUR NEGATIVE 12/10/2018 2238   KETONESUR 20 (A) 12/10/2018 2238   PROTEINUR 100 (A) 12/10/2018 2238   UROBILINOGEN 0.2 06/01/2015 0854  NITRITE NEGATIVE 12/10/2018 2238   LEUKOCYTESUR LARGE (A) 12/10/2018 2238   Sepsis Labs: @LABRCNTIP (procalcitonin:4,lacticidven:4) )No results found for this or any previous visit (from the past 240 hour(s)).   Radiological Exams on Admission: Ct Head Wo Contrast  Result Date: 12/11/2018 CLINICAL DATA:  Possible recent stroke EXAM: CT HEAD WITHOUT CONTRAST TECHNIQUE: Contiguous axial images were obtained from the base of the skull through the vertex without intravenous contrast. COMPARISON:  04/11/2015 FINDINGS: Brain: Encephalomalacia changes are noted in the right occipital lobe consistent with prior infarct. These changes are stable from prior exam. Scattered chronic white matter ischemic changes are seen. No findings to suggest acute hemorrhage, acute infarction or space-occupying mass lesion noted. Vascular: No hyperdense vessel or unexpected calcification. Skull: Normal. Negative for fracture or focal lesion. Sinuses/Orbits: No acute finding. Other: None. IMPRESSION: Chronic changes without acute abnormality. Electronically Signed   By: Inez Catalina M.D.   On: 12/11/2018 03:15   Ct Chest W Contrast  Result Date: 12/11/2018 CLINICAL DATA:  Rectal pain for 2 days. No bowel movement in 24 hours. 20 lb weight loss. Remote history of left breast cancer. EXAM: CT CHEST, ABDOMEN, AND PELVIS WITH CONTRAST TECHNIQUE: Multidetector CT imaging of the chest, abdomen and pelvis was performed following the standard protocol during bolus  administration of intravenous contrast. CONTRAST:  165m ISOVUE-300 IOPAMIDOL (ISOVUE-300) INJECTION 61% COMPARISON:  CT 07/29/2015 FINDINGS: CT CHEST FINDINGS Cardiovascular: Heart is mildly enlarged. Moderate coronary artery calcifications and scattered aortic calcifications. No evidence of aneurysm. Mediastinum/Nodes: No mediastinal, hilar, or axillary adenopathy. Lungs/Pleura: Moderate bilateral pleural effusions. Areas of atelectasis in the lower lobes bilaterally. Scarring in the lingula. No acute confluent airspace opacities or suspicious pulmonary nodules. Musculoskeletal: Chest wall soft tissues are unremarkable. No acute bony abnormality. CT ABDOMEN PELVIS FINDINGS Hepatobiliary: No focal hepatic abnormality. Gallbladder unremarkable. Pancreas: No focal abnormality or ductal dilatation. Spleen: No focal abnormality.  Normal size. Adrenals/Urinary Tract: 5.8 x 4.8 x 4.5 cm mixed density mass off the lower pole of the left kidney. This has enlarged slightly when compared to prior study when this measured 5.1 x 4.5 x 4.4 cm. Cannot exclude slowly growing renal neoplasm. No hydronephrosis. Adrenal glands unremarkable. Gas noted within the urinary bladder, presumably from recent catheterization. No bladder wall abnormality. Stomach/Bowel: There appears to be circumferential wall thickening noted in the region of the anus/lower rectum. There also appears to be fluid collection posterior to the rectum on image 124 concerning for perirectal abscess. This measures up to 3.3 cm. This also extends to the right of the rectum/anus. No evidence of bowel obstruction. Moderate stool burden in the colon. Vascular/Lymphatic: Aortic atherosclerosis. No enlarged abdominal or pelvic lymph nodes. Reproductive: Uterus and adnexa unremarkable.  No mass. Other: No free fluid or free air. Musculoskeletal: No acute bony abnormality. IMPRESSION: Circumferential wall thickening in the region of the rectum/anus concerning for  proctitis. Fluid collection noted posterior and to the right of the lower rectum and anus concerning for perirectal abscess. Mixed density mass noted off the lower pole of the left kidney measuring up to 5.8 cm, slowly enlarging since 2014 when this measured up to 5.1 cm. Cannot exclude slowly enlarging renal cell neoplasm. Recommend urologic consultation. Moderate bilateral pleural effusions. Compressive atelectasis in the lower lobes. Cardiomegaly, coronary artery disease. Electronically Signed   By: KRolm BaptiseM.D.   On: 12/11/2018 03:39   Ct Abdomen Pelvis W Contrast  Result Date: 12/11/2018 CLINICAL DATA:  Rectal pain for 2 days. No bowel movement in  24 hours. 20 lb weight loss. Remote history of left breast cancer. EXAM: CT CHEST, ABDOMEN, AND PELVIS WITH CONTRAST TECHNIQUE: Multidetector CT imaging of the chest, abdomen and pelvis was performed following the standard protocol during bolus administration of intravenous contrast. CONTRAST:  1103m ISOVUE-300 IOPAMIDOL (ISOVUE-300) INJECTION 61% COMPARISON:  CT 07/29/2015 FINDINGS: CT CHEST FINDINGS Cardiovascular: Heart is mildly enlarged. Moderate coronary artery calcifications and scattered aortic calcifications. No evidence of aneurysm. Mediastinum/Nodes: No mediastinal, hilar, or axillary adenopathy. Lungs/Pleura: Moderate bilateral pleural effusions. Areas of atelectasis in the lower lobes bilaterally. Scarring in the lingula. No acute confluent airspace opacities or suspicious pulmonary nodules. Musculoskeletal: Chest wall soft tissues are unremarkable. No acute bony abnormality. CT ABDOMEN PELVIS FINDINGS Hepatobiliary: No focal hepatic abnormality. Gallbladder unremarkable. Pancreas: No focal abnormality or ductal dilatation. Spleen: No focal abnormality.  Normal size. Adrenals/Urinary Tract: 5.8 x 4.8 x 4.5 cm mixed density mass off the lower pole of the left kidney. This has enlarged slightly when compared to prior study when this measured 5.1 x  4.5 x 4.4 cm. Cannot exclude slowly growing renal neoplasm. No hydronephrosis. Adrenal glands unremarkable. Gas noted within the urinary bladder, presumably from recent catheterization. No bladder wall abnormality. Stomach/Bowel: There appears to be circumferential wall thickening noted in the region of the anus/lower rectum. There also appears to be fluid collection posterior to the rectum on image 124 concerning for perirectal abscess. This measures up to 3.3 cm. This also extends to the right of the rectum/anus. No evidence of bowel obstruction. Moderate stool burden in the colon. Vascular/Lymphatic: Aortic atherosclerosis. No enlarged abdominal or pelvic lymph nodes. Reproductive: Uterus and adnexa unremarkable.  No mass. Other: No free fluid or free air. Musculoskeletal: No acute bony abnormality. IMPRESSION: Circumferential wall thickening in the region of the rectum/anus concerning for proctitis. Fluid collection noted posterior and to the right of the lower rectum and anus concerning for perirectal abscess. Mixed density mass noted off the lower pole of the left kidney measuring up to 5.8 cm, slowly enlarging since 2014 when this measured up to 5.1 cm. Cannot exclude slowly enlarging renal cell neoplasm. Recommend urologic consultation. Moderate bilateral pleural effusions. Compressive atelectasis in the lower lobes. Cardiomegaly, coronary artery disease. Electronically Signed   By: KRolm BaptiseM.D.   On: 12/11/2018 03:39    EKG: Independently reviewed. Sinus rhythm, LAD, minimal ST-depressions laterally.   Assessment/Plan  1. Perirectal abscess  - Presents with rectal pain and swelling; CT findings suggestive of proctitis with perirectal abscess  - Surgery is consulting and much appreciated  - Continue empiric antibiotics and supportive care   2. Left renal mass  - Left renal mass noted on CT with slow-growing RCC not excluded  - Urology consultation recommended    3. Pleural effusions  -  Moderate pleural effusions noted bilaterally on chest CT  - Denies cough and denies worsening in her chronic DOE  - Possibly related to CHF though appears hypovolemic  - Check LFT's, IR consulted for diagnostic thoracentesis    4. Chronic combined systolic & diastolic CHF  - There is pleural effusions noted, possibly related to CHF, but wt is down 20 lbs over past 2 months per pt report  - She appeared hypovolemic in ED and was given 1 liter NS  - Continue gentle IVF hydration while NPO, hold diuretics, follow daily wt and I/O's    5. CAD  - No anginal complaints  - Taking ASA, beta-blocker, nitrates, and statin at home; NPO for now  6. History of CVA  - Head CT with no acute findings  - NPO pending surgical evaluation, resume ASA and statin when appropriate   7. Insulin-dependent DM  - A1c was 14.3% one year ago  - Managed at home with Lantus 18 units qHS and Humalog 10-12 units qAM  - Check CBG's q4h while NPO, continue Lantus with dose-reduction while NPO, and use a SSI with Novolog while in hospital     DVT prophylaxis: SCD's  Code Status: Full  Family Communication: Husband and daughter updated at bedside Consults called: Surgery Admission status: Observation     Vianne Bulls, MD [facility] Pager [phone]  If 7PM-7AM, please contact night-coverage [email or web address] Password [facility]  12/11/2018, 4:33 AM

## 2018-12-11 NOTE — Progress Notes (Addendum)
Marisa Forbes is a 68 y.o. female with medical history significant for insulin-dependent diabetes mellitus, chronic combined systolic and diastolic CHF, history of CVA, history of breast cancer status post lumpectomy followed by chemotherapy and radiation, and coronary artery disease, now presenting to the emergency department for evaluation of rectal pain, decreased appetite, and unintentional weight loss.  Patient reports insidious development of loss of appetite and general malaise over the past couple months.  She reports losing approximately 20 pounds over this interval.  She has also developed worsening rectal pain over the past 2 days and reports a swelling around her anus.  She denies any significant shortness of breath or cough, denies chest pain, and has not noted any fevers or chills.  She denies any gross hematuria.  12/11/18: Seen and examined with her husband and daughter at her bedside. Pain is well controlled. Possible I&D in the OR today. All questions answered to their satisfaction.  Update: Urology, Dr Dorina Hoyer, has been consulted and will see the patient.  Please refer to H&P dictated by Dr Myna Hidalgo on 12/11/18 for further details of the assessment and plan.

## 2018-12-11 NOTE — Anesthesia Procedure Notes (Addendum)
Procedure Name: Intubation Date/Time: 12/11/2018 12:28 PM Performed by: Lissa Morales, CRNA Pre-anesthesia Checklist: Patient identified, Emergency Drugs available, Suction available, Patient being monitored and Timeout performed Patient Re-evaluated:Patient Re-evaluated prior to induction Oxygen Delivery Method: Circle system utilized Preoxygenation: Pre-oxygenation with 100% oxygen Induction Type: IV induction, Rapid sequence and Cricoid Pressure applied Ventilation: Mask ventilation without difficulty Laryngoscope Size: Mac and 4 Grade View: Grade II Tube type: Oral Tube size: 7.0 mm Number of attempts: 1 Airway Equipment and Method: Stylet Placement Confirmation: ETT inserted through vocal cords under direct vision,  positive ETCO2 and breath sounds checked- equal and bilateral Secured at: 20 cm Tube secured with: Tape Dental Injury: Teeth and Oropharynx as per pre-operative assessment

## 2018-12-11 NOTE — Consult Note (Signed)
[facility] Surgery Consult Note  WILEY FLICKER 07-Apr-1951  [id number].    Requesting MD: Nevada Crane Chief Complaint/Reason for Consult: perirectal abscess  HPI:  Patient is a 68 year old female who presented to [facility] last night with rectal pain and fullness. Pain started 2 days ago and has progressively worsened. Pain is constant and located in right gluteus. Not improved by anything at home, improved somewhat with pain medication here. Patient has never had a perirectal abscess in the past. Reports associated constipation secondary to pain. Denies diarrhea, bloody stools, abdominal pain, nausea, vomiting. Patient denies urinary symptoms, but was told on admission that she has a UTI. Patient does report increased fatigue and weight loss over the last month or so. Has lost about 20 lbs. Also seen on CT was left renal mass. Denies fever, chills, chest pain, palpitations. Patient has some chronic DOE, but does not feel this has worsened acutely. PMH significant for chronic combined CHF, insulin dependent type 2 diabetes mellitus, Hx of CVA, Hx of breast cancer s/p lumpectomy, CAD w/ Hx of MI s/p stent. Blood glucose runs in the 200s usually at home, patient is now on insulin only. Reports she knows it is poorly controlled at times because she enjoys sweets. Patient is not on any blood thinning medications. Patient denies alcohol, tobacco, and illicit drug use.    ROS: Review of Systems  Constitutional: Positive for malaise/fatigue and weight loss. Negative for chills and fever.  Respiratory: Positive for shortness of breath. Negative for wheezing.   Cardiovascular: Negative for chest pain, palpitations and leg swelling.  Gastrointestinal: Positive for constipation. Negative for abdominal pain, blood in stool, diarrhea, nausea and vomiting.  Genitourinary: Negative for dysuria, frequency, hematuria and urgency.       Rectal pain and swelling   All other systems reviewed and are negative.   Family  History  Problem Relation Age of Onset  . Heart disease Father   . Hypertension Father   . Cancer Neg Hx   . Diabetes Neg Hx   . Hyperlipidemia Neg Hx   . Kidney disease Neg Hx   . Stroke Neg Hx     Past Medical History:  Diagnosis Date  . Arthritis    back  . Balance problems    due to stroke  . Breast cancer ([place]) 12/03/12   a. 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 S/p chemo with CMF and radiation.  . CHF (congestive heart failure) ([place])    On Monday she said her cardiologist was concerned for possible CHF, but is now improving-per pt.(07/26/15)  . Coronary artery disease    a. STEMI 08/2012: s/p DES to LAD, PTCA to downstream LAD. b. Relook cath same hospitalization: stable post-PCI anatomy with Stable Diag lesions (unlikely cause of resting angina, not good PCI targets), stable RCA lesion (non flow limiting).  . Diabetes mellitus without complication ([place])    Takes insulin and metformin  . GERD (gastroesophageal reflux disease)    food related  . History of cancer chemotherapy    last in August 2014  . 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 History of radiation therapy 06/23/2013-08/08/2013   62.4 gray to left breast  . HTN (hypertension)   . Hypercholesteremia   . LV dysfunction    a. EF 45-50% at time of STEMI 08/2012. b.  Echo 04/2013: EF 45-50%, mild focal basal hypertrophy of septum, grade 1 d/d.  Marland Kitchen Myocardial infarction ([place]) 09/07/12  . Peripheral vision loss    left  . Renal artery stenosis ([facility])    a. 20% L RAS in 08/2012 by cath. b. Duplex 07/2012: >60% L RAS.  . Stroke, embolic ([place])    a. 40/0867 post cath.  . Vasovagal syncope    a. 04/2013 - echo stable compared to prior EF 45-50%, negative carotid duplex.    Past Surgical History:  Procedure Laterality  Date  . BACK SURGERY     x3, lower back  . BREAST BIOPSY Left 12/03/2012   U/S Core- Malignant  . BREAST LUMPECTOMY Left 01/08/2013  . BREAST LUMPECTOMY WITH NEEDLE LOCALIZATION AND AXILLARY SENTINEL LYMPH NODE BX Left 01/08/2013   Procedure: LEFT BREAST WIRE GUIDED  LUMPECTOMY AND LEFT AXILLARY SENTINEL  NODE BX;  Surgeon: Rolm Bookbinder, MD;  Location: [place];  Service: General;  Laterality: Left;  . CATARACT EXTRACTION Bilateral   . CORONARY ANGIOPLASTY WITH STENT PLACEMENT    . LEFT HEART CATHETERIZATION WITH CORONARY ANGIOGRAM N/A 09/07/2012   Procedure: LEFT HEART CATHETERIZATION WITH CORONARY ANGIOGRAM;  Surgeon: Troy Sine, MD;  Location: [facility] CATH LAB;  Service: Cardiovascular;  Laterality: N/A;  . LEFT HEART CATHETERIZATION WITH CORONARY ANGIOGRAM  09/09/2012   Procedure: LEFT HEART CATHETERIZATION WITH CORONARY ANGIOGRAM;  Surgeon: Leonie Man, MD;  Location: [facility] CATH LAB;  Service: Cardiovascular;;  . PERCUTANEOUS CORONARY STENT INTERVENTION (PCI-S) N/A 09/07/2012   Procedure: PERCUTANEOUS CORONARY STENT INTERVENTION (PCI-S);  Surgeon: Troy Sine, MD;  Location: [facility] CATH LAB;  Service: Cardiovascular;  Laterality: N/A;  . PORT-A-CATH REMOVAL N/A 10/14/2013   Procedure: REMOVAL PORT-A-CATH;  Surgeon: Rolm Bookbinder, MD;  Location: [facility] ORS;  Service: General;  Laterality: N/A;  . PORTACATH PLACEMENT Right 02/17/2013   Procedure: INSERTION PORT-A-CATH;  Surgeon: Rolm Bookbinder, MD;  Location: [place];  Service: General;  Laterality: Right;    Social History:  reports that she has never smoked. She has never used smokeless tobacco. She reports current alcohol use. She reports that she does not use drugs.  Allergies: No Known Allergies  Medications Prior to Admission  Medication Sig Dispense Refill  . aspirin 81 MG EC tablet Take 1 tablet (81 mg total) by mouth daily. 30 tablet 12  . atorvastatin (LIPITOR) 80 MG tablet Take 1 tablet (80 mg total) by mouth daily. 30 tablet  11  . carvedilol (COREG) 25 MG tablet TAKE 1 TABLET(25 MG) BY MOUTH TWICE DAILY WITH A MEAL 60 tablet 11  . ezetimibe (ZETIA) 10 MG tablet TAKE 1 TABLET(10 MG) BY MOUTH DAILY 30 tablet 11  . furosemide (LASIX) 40 MG tablet Take 1 tablet (40 mg total) by mouth daily. 30 tablet 11  . Insulin Glargine (LANTUS SOLOSTAR) 100 UNIT/ML Solostar Pen Inject 18 units [place] at bedtime. 1 pen 3  . insulin lispro (HUMALOG) 100 UNIT/ML injection Inject 10-12 Units into the skin every morning.     . isosorbide mononitrate (IMDUR) 60 MG 24 hr tablet Take 1 tablet (60 mg total) by mouth daily. Please call and make an appt with Dr. Harrington Challenger for further refills. 30 tablet 11  . nitroGLYCERIN (NITROSTAT) 0.4 MG SL tablet Place 1 tablet (0.4 mg total) under the tongue every 5 (five) minutes as needed for chest pain. 25 tablet 11  . sacubitril-valsartan (ENTRESTO) 49-51 MG Take 1 tablet by mouth 2 (two) times daily. 180 tablet 3  .  spironolactone (ALDACTONE) 25 MG tablet Take 1 tablet (25 mg total) by mouth daily. 90 tablet 11  . glucose blood (ONETOUCH VERIO) test strip Use TID 100 each 12    Blood pressure 140/82, pulse 92, temperature 99.4 F (37.4 C), temperature source Oral, resp. rate 16, height _0  (1.651 m), weight 53.5 kg, SpO2 96 %. Physical Exam: Physical Exam Constitutional:      General: She is not in acute distress.    Appearance: She is well-developed and underweight. She is not toxic-appearing.  HENT:     Head: Normocephalic and atraumatic.     Right Ear: External ear normal.     Left Ear: External ear normal.     Nose: Nose normal.     Mouth/Throat:     Lips: Pink.  Eyes:     General: Lids are normal. No scleral icterus.    Extraocular Movements: Extraocular movements intact.     Conjunctiva/sclera: Conjunctivae normal.  Neck:     Musculoskeletal: Normal range of motion and neck supple.  Cardiovascular:     Rate and Rhythm: Normal rate and regular rhythm.     Pulses:          Radial pulses  are 2+ on the right side and 2+ on the left side.       Dorsalis pedis pulses are 2+ on the right side and 2+ on the left side.  Pulmonary:     Effort: Pulmonary effort is normal.     Breath sounds: Normal air entry. Rales (bilbasilar ) present. No wheezing or rhonchi.  Abdominal:     General: Bowel sounds are normal. There is no distension.     Palpations: Abdomen is soft.     Tenderness: There is no abdominal tenderness. There is no guarding.     Hernia: No hernia is present.  Genitourinary:    Comments: Perirectal swelling and induration with erythema extending to R gluteus, small area of fluctuance centrally, TTP  Musculoskeletal:     Comments: ROM grossly intact in all 4 extremities  Skin:    General: Skin is warm and dry.  Neurological:     Mental Status: She is alert and oriented to person, place, and time.  Psychiatric:        Attention and Perception: Attention normal.        Mood and Affect: Mood normal.        Speech: Speech normal.        Behavior: Behavior is cooperative.     Results for orders placed or performed during the hospital encounter of 12/10/18 (from the past 48 hour(s))  Basic metabolic panel     Status: Abnormal   Collection Time: 12/10/18  6:51 PM  Result Value Ref Range   Sodium 133 (L) 135 - 145 mmol/L   Potassium 3.7 3.5 - 5.1 mmol/L   Chloride 96 (L) 98 - 111 mmol/L   CO2 22 22 - 32 mmol/L   Glucose, Bld 311 (H) 70 - 99 mg/dL   BUN 14 8 - 23 mg/dL   Creatinine, Ser 0.67 0.44 - 1.00 mg/dL   Calcium 9.3 8.9 - 10.3 mg/dL   GFR calc non Af Amer >60 >60 mL/min   GFR calc Af Amer >60 >60 mL/min   Anion gap 15 5 - 15    Comment: Performed at [facility], [place] [address], [place], [place] [address]  CBC     Status: Abnormal   Collection Time: 12/10/18  6:51  PM  Result Value Ref Range   WBC 11.8 (H) 4.0 - 10.5 K/uL   RBC 5.17 (H) 3.87 - 5.11 MIL/uL   Hemoglobin 14.9 12.0 - 15.0 g/dL   HCT 45.6 36.0 - 46.0 %   MCV 88.2 80.0 -  100.0 fL   MCH 28.8 26.0 - 34.0 pg   MCHC 32.7 30.0 - 36.0 g/dL   RDW 13.9 11.5 - 15.5 %   Platelets 203 150 - 400 K/uL   nRBC 0.0 0.0 - 0.2 %    Comment: Performed at [facility], [place] [address] [place], [place] [address]  Urinalysis, Routine w reflex microscopic     Status: Abnormal   Collection Time: 12/10/18 10:38 PM  Result Value Ref Range   Color, Urine YELLOW YELLOW   APPearance CLOUDY (A) CLEAR   Specific Gravity, Urine 1.025 1.005 - 1.030   pH 5.0 5.0 - 8.0   Glucose, UA >=500 (A) NEGATIVE mg/dL   Hgb urine dipstick SMALL (A) NEGATIVE   Bilirubin Urine NEGATIVE NEGATIVE   Ketones, ur 20 (A) NEGATIVE mg/dL   Protein, ur 100 (A) NEGATIVE mg/dL   Nitrite NEGATIVE NEGATIVE   Leukocytes,Ua LARGE (A) NEGATIVE   RBC / HPF 11-20 0 - 5 RBC/hpf   WBC, UA 11-20 0 - 5 WBC/hpf   Bacteria, UA MANY (A) NONE SEEN   Squamous Epithelial / LPF 0-5 0 - 5   WBC Clumps PRESENT    Mucus PRESENT    Hyaline Casts, UA PRESENT     Comment: Performed at [facility], [place] [address], [place], [place] [address]  I-Stat Troponin, ED (not at [facility])     Status: None   Collection Time: 12/11/18  1:20 AM  Result Value Ref Range   Troponin i, poc 0.02 0.00 - 0.08 ng/mL   Comment 3            Comment: Due to the release kinetics of cTnI, a negative result within the first hours of the onset of symptoms does not rule out myocardial infarction with certainty. If myocardial infarction is still suspected, repeat the test at appropriate intervals.   Basic metabolic panel     Status: Abnormal   Collection Time: 12/11/18  6:05 AM  Result Value Ref Range   Sodium 129 (L) 135 - 145 mmol/L   Potassium 3.4 (L) 3.5 - 5.1 mmol/L   Chloride 93 (L) 98 - 111 mmol/L   CO2 20 (L) 22 - 32 mmol/L   Glucose, Bld 335 (H) 70 - 99 mg/dL   BUN 15 8 - 23 mg/dL   Creatinine, Ser 0.53 0.44 - 1.00 mg/dL   Calcium 8.5 (L) 8.9 - 10.3 mg/dL   GFR calc non Af Amer >60 >60 mL/min   GFR  calc Af Amer >60 >60 mL/min   Anion gap 16 (H) 5 - 15    Comment: Performed at [facility], [place] [address], [place], [place] [address]  CBC WITH DIFFERENTIAL     Status: Abnormal   Collection Time: 12/11/18  6:05 AM  Result Value Ref Range   WBC 9.1 4.0 - 10.5 K/uL   RBC 4.93 3.87 - 5.11 MIL/uL   Hemoglobin 13.7 12.0 - 15.0 g/dL   HCT 42.7 36.0 - 46.0 %   MCV 86.6 80.0 - 100.0 fL   MCH 27.8 26.0 - 34.0 pg   MCHC 32.1 30.0 - 36.0 g/dL   RDW 13.8 11.5 - 15.5 %  Platelets 181 150 - 400 K/uL   nRBC 0.0 0.0 - 0.2 %   Neutrophils Relative % 85 %   Neutro Abs 7.7 1.7 - 7.7 K/uL   Lymphocytes Relative 6 %   Lymphs Abs 0.6 (L) 0.7 - 4.0 K/uL   Monocytes Relative 9 %   Monocytes Absolute 0.8 0.1 - 1.0 K/uL   Eosinophils Relative 0 %   Eosinophils Absolute 0.0 0.0 - 0.5 K/uL   Basophils Relative 0 %   Basophils Absolute 0.0 0.0 - 0.1 K/uL   Immature Granulocytes 0 %   Abs Immature Granulocytes 0.03 0.00 - 0.07 K/uL    Comment: Performed at [facility], [address] [place], [place] [address]  Hepatic function panel     Status: Abnormal   Collection Time: 12/11/18  6:05 AM  Result Value Ref Range   Total Protein 6.3 (L) 6.5 - 8.1 g/dL   Albumin 3.3 (L) 3.5 - 5.0 g/dL   AST 15 15 - 41 U/L   ALT 16 0 - 44 U/L   Alkaline Phosphatase 93 38 - 126 U/L   Total Bilirubin 1.4 (H) 0.3 - 1.2 mg/dL   Bilirubin, Direct 0.3 (H) 0.0 - 0.2 mg/dL   Indirect Bilirubin 1.1 (H) 0.3 - 0.9 mg/dL    Comment: Performed at [facility], [address] [place], [place] [address]  Lactate dehydrogenase     Status: None   Collection Time: 12/11/18  6:05 AM  Result Value Ref Range   LDH 144 98 - 192 U/L    Comment: Performed at [facility], [address] [place], [place] [address]   Ct Head Wo Contrast  Result Date: 12/11/2018 CLINICAL DATA:  Possible recent stroke EXAM: CT HEAD WITHOUT CONTRAST TECHNIQUE: Contiguous axial  images were obtained from the base of the skull through the vertex without intravenous contrast. COMPARISON:  04/11/2015 FINDINGS: Brain: Encephalomalacia changes are noted in the right occipital lobe consistent with prior infarct. These changes are stable from prior exam. Scattered chronic white matter ischemic changes are seen. No findings to suggest acute hemorrhage, acute infarction or space-occupying mass lesion noted. Vascular: No hyperdense vessel or unexpected calcification. Skull: Normal. Negative for fracture or focal lesion. Sinuses/Orbits: No acute finding. Other: None. IMPRESSION: Chronic changes without acute abnormality. Electronically Signed   By: Mark  Lukens M.D.   On: 12/11/2018 03:15   Ct Chest W Contrast  Result Date: 12/11/2018 CLINICAL DATA:  Rectal pain for 2 days. No bowel movement in 24 hours. 20 lb weight loss. Remote history of left breast cancer. EXAM: CT CHEST, ABDOMEN, AND PELVIS WITH CONTRAST TECHNIQUE: Multidetector CT imaging of the chest, abdomen and pelvis was performed following the standard protocol during bolus administration of intravenous contrast. CONTRAST:  100mL ISOVUE-300 IOPAMIDOL (ISOVUE-300) INJECTION 61% COMPARISON:  CT 07/29/2015 FINDINGS: CT CHEST FINDINGS Cardiovascular: Heart is mildly enlarged. Moderate coronary artery calcifications and scattered aortic calcifications. No evidence of aneurysm. Mediastinum/Nodes: No mediastinal, hilar, or axillary adenopathy. Lungs/Pleura: Moderate bilateral pleural effusions. Areas of atelectasis in the lower lobes bilaterally. Scarring in the lingula. No acute confluent airspace opacities or suspicious pulmonary nodules. Musculoskeletal: Chest wall soft tissues are unremarkable. No acute bony abnormality. CT ABDOMEN PELVIS FINDINGS Hepatobiliary: No focal hepatic abnormality. Gallbladder unremarkable. Pancreas: No focal abnormality or ductal dilatation. Spleen: No focal abnormality.  Normal size. Adrenals/Urinary Tract: 5.8  x 4.8 x 4.5 cm mixed density mass off the lower pole of the left kidney. This has   enlarged slightly when compared to prior study when this measured 5.1 x 4.5 x 4.4 cm. Cannot exclude slowly growing renal neoplasm. No hydronephrosis. Adrenal glands unremarkable. Gas noted within the urinary bladder, presumably from recent catheterization. No bladder wall abnormality. Stomach/Bowel: There appears to be circumferential wall thickening noted in the region of the anus/lower rectum. There also appears to be fluid collection posterior to the rectum on image 124 concerning for perirectal abscess. This measures up to 3.3 cm. This also extends to the right of the rectum/anus. No evidence of bowel obstruction. Moderate stool burden in the colon. Vascular/Lymphatic: Aortic atherosclerosis. No enlarged abdominal or pelvic lymph nodes. Reproductive: Uterus and adnexa unremarkable.  No mass. Other: No free fluid or free air. Musculoskeletal: No acute bony abnormality. IMPRESSION: Circumferential wall thickening in the region of the rectum/anus concerning for proctitis. Fluid collection noted posterior and to the right of the lower rectum and anus concerning for perirectal abscess. Mixed density mass noted off the lower pole of the left kidney measuring up to 5.8 cm, slowly enlarging since 2014 when this measured up to 5.1 cm. Cannot exclude slowly enlarging renal cell neoplasm. Recommend urologic consultation. Moderate bilateral pleural effusions. Compressive atelectasis in the lower lobes. Cardiomegaly, coronary artery disease. Electronically Signed   By: Rolm Baptise M.D.   On: 12/11/2018 03:39   Ct Abdomen Pelvis W Contrast  Result Date: 12/11/2018 CLINICAL DATA:  Rectal pain for 2 days. No bowel movement in 24 hours. 20 lb weight loss. Remote history of left breast cancer. EXAM: CT CHEST, ABDOMEN, AND PELVIS WITH CONTRAST TECHNIQUE: Multidetector CT imaging of the chest, abdomen and pelvis was performed following the  standard protocol during bolus administration of intravenous contrast. CONTRAST:  13m ISOVUE-300 IOPAMIDOL (ISOVUE-300) INJECTION 61% COMPARISON:  CT 07/29/2015 FINDINGS: CT CHEST FINDINGS Cardiovascular: Heart is mildly enlarged. Moderate coronary artery calcifications and scattered aortic calcifications. No evidence of aneurysm. Mediastinum/Nodes: No mediastinal, hilar, or axillary adenopathy. Lungs/Pleura: Moderate bilateral pleural effusions. Areas of atelectasis in the lower lobes bilaterally. Scarring in the lingula. No acute confluent airspace opacities or suspicious pulmonary nodules. Musculoskeletal: Chest wall soft tissues are unremarkable. No acute bony abnormality. CT ABDOMEN PELVIS FINDINGS Hepatobiliary: No focal hepatic abnormality. Gallbladder unremarkable. Pancreas: No focal abnormality or ductal dilatation. Spleen: No focal abnormality.  Normal size. Adrenals/Urinary Tract: 5.8 x 4.8 x 4.5 cm mixed density mass off the lower pole of the left kidney. This has enlarged slightly when compared to prior study when this measured 5.1 x 4.5 x 4.4 cm. Cannot exclude slowly growing renal neoplasm. No hydronephrosis. Adrenal glands unremarkable. Gas noted within the urinary bladder, presumably from recent catheterization. No bladder wall abnormality. Stomach/Bowel: There appears to be circumferential wall thickening noted in the region of the anus/lower rectum. There also appears to be fluid collection posterior to the rectum on image 124 concerning for perirectal abscess. This measures up to 3.3 cm. This also extends to the right of the rectum/anus. No evidence of bowel obstruction. Moderate stool burden in the colon. Vascular/Lymphatic: Aortic atherosclerosis. No enlarged abdominal or pelvic lymph nodes. Reproductive: Uterus and adnexa unremarkable.  No mass. Other: No free fluid or free air. Musculoskeletal: No acute bony abnormality. IMPRESSION: Circumferential wall thickening in the region of the  rectum/anus concerning for proctitis. Fluid collection noted posterior and to the right of the lower rectum and anus concerning for perirectal abscess. Mixed density mass noted off the lower pole of the left kidney measuring up to 5.8 cm, slowly enlarging since  2014 when this measured up to 5.1 cm. Cannot exclude slowly enlarging renal cell neoplasm. Recommend urologic consultation. Moderate bilateral pleural effusions. Compressive atelectasis in the lower lobes. Cardiomegaly, coronary artery disease. Electronically Signed   By: Rolm Baptise M.D.   On: 12/11/2018 03:39      Assessment/Plan Insulin dependent type 2 diabetes mellitus - SSI, need to get better control glucose in the 300s this AM, will order A1c Chronic combined CHF - last echo with EF 45-50% CAD/Hx of MI with stent Hx of CVA HTN L renal mass - noted on CT, recommend urology consult Pleural effusions - IR was consulted for possible thoracentesis  Perirectal abscess with proctitis - CT shows wall thickening around rectum/anus, fluid collection to the R of rectum - WBC 9 from 11.8 on admission, afebrile - obvious area of cellulitis with central fluctuance - plan for I&D in the OR today   FEN: NPO, IVF VTE: SCDs ID: [id number] 2/12>>   [place]igid Re, [facility] Surgery 12/11/2018, 7:33 AM Pager: [phone] Consults: [phone]

## 2018-12-12 ENCOUNTER — Observation Stay (HOSPITAL_COMMUNITY): Payer: Medicare Other

## 2018-12-12 ENCOUNTER — Encounter (HOSPITAL_COMMUNITY): Payer: Self-pay | Admitting: Surgery

## 2018-12-12 DIAGNOSIS — Z794 Long term (current) use of insulin: Secondary | ICD-10-CM | POA: Diagnosis not present

## 2018-12-12 DIAGNOSIS — R846 Abnormal cytological findings in specimens from respiratory organs and thorax: Secondary | ICD-10-CM | POA: Diagnosis not present

## 2018-12-12 DIAGNOSIS — Z955 Presence of coronary angioplasty implant and graft: Secondary | ICD-10-CM | POA: Diagnosis not present

## 2018-12-12 DIAGNOSIS — E861 Hypovolemia: Secondary | ICD-10-CM | POA: Diagnosis present

## 2018-12-12 DIAGNOSIS — N289 Disorder of kidney and ureter, unspecified: Secondary | ICD-10-CM | POA: Diagnosis not present

## 2018-12-12 DIAGNOSIS — Z853 Personal history of malignant neoplasm of breast: Secondary | ICD-10-CM | POA: Diagnosis not present

## 2018-12-12 DIAGNOSIS — N390 Urinary tract infection, site not specified: Secondary | ICD-10-CM | POA: Diagnosis present

## 2018-12-12 DIAGNOSIS — H547 Unspecified visual loss: Secondary | ICD-10-CM | POA: Diagnosis present

## 2018-12-12 DIAGNOSIS — I252 Old myocardial infarction: Secondary | ICD-10-CM | POA: Diagnosis not present

## 2018-12-12 DIAGNOSIS — Z171 Estrogen receptor negative status [ER-]: Secondary | ICD-10-CM | POA: Diagnosis not present

## 2018-12-12 DIAGNOSIS — K644 Residual hemorrhoidal skin tags: Secondary | ICD-10-CM | POA: Diagnosis present

## 2018-12-12 DIAGNOSIS — I5042 Chronic combined systolic (congestive) and diastolic (congestive) heart failure: Secondary | ICD-10-CM | POA: Diagnosis present

## 2018-12-12 DIAGNOSIS — I2510 Atherosclerotic heart disease of native coronary artery without angina pectoris: Secondary | ICD-10-CM | POA: Diagnosis present

## 2018-12-12 DIAGNOSIS — K5900 Constipation, unspecified: Secondary | ICD-10-CM | POA: Diagnosis present

## 2018-12-12 DIAGNOSIS — Z681 Body mass index (BMI) 19 or less, adult: Secondary | ICD-10-CM | POA: Diagnosis not present

## 2018-12-12 DIAGNOSIS — K611 Rectal abscess: Secondary | ICD-10-CM | POA: Diagnosis present

## 2018-12-12 DIAGNOSIS — J90 Pleural effusion, not elsewhere classified: Secondary | ICD-10-CM | POA: Diagnosis not present

## 2018-12-12 DIAGNOSIS — E1142 Type 2 diabetes mellitus with diabetic polyneuropathy: Secondary | ICD-10-CM | POA: Diagnosis present

## 2018-12-12 DIAGNOSIS — N2889 Other specified disorders of kidney and ureter: Secondary | ICD-10-CM | POA: Diagnosis not present

## 2018-12-12 DIAGNOSIS — Z8673 Personal history of transient ischemic attack (TIA), and cerebral infarction without residual deficits: Secondary | ICD-10-CM | POA: Diagnosis not present

## 2018-12-12 DIAGNOSIS — I110 Hypertensive heart disease with heart failure: Secondary | ICD-10-CM | POA: Diagnosis present

## 2018-12-12 DIAGNOSIS — E7800 Pure hypercholesterolemia, unspecified: Secondary | ICD-10-CM | POA: Diagnosis present

## 2018-12-12 DIAGNOSIS — Z9221 Personal history of antineoplastic chemotherapy: Secondary | ICD-10-CM | POA: Diagnosis not present

## 2018-12-12 DIAGNOSIS — K219 Gastro-esophageal reflux disease without esophagitis: Secondary | ICD-10-CM | POA: Diagnosis present

## 2018-12-12 DIAGNOSIS — Z79899 Other long term (current) drug therapy: Secondary | ICD-10-CM | POA: Diagnosis not present

## 2018-12-12 DIAGNOSIS — Z923 Personal history of irradiation: Secondary | ICD-10-CM | POA: Diagnosis not present

## 2018-12-12 DIAGNOSIS — Z7982 Long term (current) use of aspirin: Secondary | ICD-10-CM | POA: Diagnosis not present

## 2018-12-12 LAB — CBC
HCT: 40.2 % (ref 36.0–46.0)
Hemoglobin: 13 g/dL (ref 12.0–15.0)
MCH: 28.8 pg (ref 26.0–34.0)
MCHC: 32.3 g/dL (ref 30.0–36.0)
MCV: 88.9 fL (ref 80.0–100.0)
Platelets: 183 10*3/uL (ref 150–400)
RBC: 4.52 MIL/uL (ref 3.87–5.11)
RDW: 13.9 % (ref 11.5–15.5)
WBC: 10.1 10*3/uL (ref 4.0–10.5)
nRBC: 0 % (ref 0.0–0.2)

## 2018-12-12 LAB — HIV ANTIBODY (ROUTINE TESTING W REFLEX): HIV Screen 4th Generation wRfx: NONREACTIVE

## 2018-12-12 LAB — GLUCOSE, CAPILLARY
Glucose-Capillary: 127 mg/dL — ABNORMAL HIGH (ref 70–99)
Glucose-Capillary: 211 mg/dL — ABNORMAL HIGH (ref 70–99)
Glucose-Capillary: 255 mg/dL — ABNORMAL HIGH (ref 70–99)
Glucose-Capillary: 257 mg/dL — ABNORMAL HIGH (ref 70–99)
Glucose-Capillary: 97 mg/dL (ref 70–99)

## 2018-12-12 LAB — LACTATE DEHYDROGENASE, PLEURAL OR PERITONEAL FLUID: LD, Fluid: 30 U/L — ABNORMAL HIGH (ref 3–23)

## 2018-12-12 LAB — GRAM STAIN: Gram Stain: NONE SEEN

## 2018-12-12 LAB — PROTEIN, PLEURAL OR PERITONEAL FLUID: Total protein, fluid: <3 g/dL

## 2018-12-12 LAB — BASIC METABOLIC PANEL
Anion gap: 7 (ref 5–15)
BUN: 19 mg/dL (ref 8–23)
CO2: 22 mmol/L (ref 22–32)
Calcium: 8.5 mg/dL — ABNORMAL LOW (ref 8.9–10.3)
Chloride: 103 mmol/L (ref 98–111)
Creatinine, Ser: 0.58 mg/dL (ref 0.44–1.00)
GFR calc Af Amer: >60 mL/min (ref >60)
GFR calc non Af Amer: >60 mL/min (ref >60)
Glucose, Bld: 255 mg/dL — ABNORMAL HIGH (ref 70–99)
Potassium: 3.3 mmol/L — ABNORMAL LOW (ref 3.5–5.1)
Sodium: 132 mmol/L — ABNORMAL LOW (ref 135–145)

## 2018-12-12 LAB — GLUCOSE, PLEURAL OR PERITONEAL FLUID: Glucose, Fluid: 233 mg/dL

## 2018-12-12 MED ORDER — INSULIN GLARGINE 100 UNIT/ML ~~LOC~~ SOLN
15.0000 [IU] | Freq: Every day | SUBCUTANEOUS | Status: DC
  Administered 2018-12-12 – 2018-12-13 (×2): 15 [IU] via SUBCUTANEOUS
  Filled 2018-12-12 (×3): qty 0.15

## 2018-12-12 MED ORDER — SPIRONOLACTONE 25 MG PO TABS
25.0000 mg | ORAL_TABLET | Freq: Every day | ORAL | Status: DC
  Administered 2018-12-12 – 2018-12-14 (×3): 25 mg via ORAL
  Filled 2018-12-12 (×3): qty 1

## 2018-12-12 MED ORDER — SACUBITRIL-VALSARTAN 49-51 MG PO TABS
1.0000 | ORAL_TABLET | Freq: Two times a day (BID) | ORAL | Status: DC
  Administered 2018-12-12 – 2018-12-14 (×5): 1 via ORAL
  Filled 2018-12-12 (×5): qty 1

## 2018-12-12 MED ORDER — ENSURE ENLIVE PO LIQD: 237.0000 mL | Freq: Two times a day (BID) | ORAL | Status: DC | PRN

## 2018-12-12 MED ORDER — POTASSIUM CHLORIDE CRYS ER 20 MEQ PO TBCR
40.0000 meq | EXTENDED_RELEASE_TABLET | Freq: Once | ORAL | Status: AC
  Administered 2018-12-12: 40 meq via ORAL
  Filled 2018-12-12: qty 2

## 2018-12-12 MED ORDER — NITROGLYCERIN 0.4 MG SL SUBL: 0.4000 mg | SUBLINGUAL_TABLET | SUBLINGUAL | Status: DC | PRN

## 2018-12-12 MED ORDER — GADOBUTROL 1 MMOL/ML IV SOLN
6.0000 mL | Freq: Once | INTRAVENOUS | Status: AC | PRN
  Administered 2018-12-12: 6 mL via INTRAVENOUS

## 2018-12-12 MED ORDER — LIDOCAINE HCL 1 % IJ SOLN
INTRAMUSCULAR | Status: AC
  Filled 2018-12-12: qty 20

## 2018-12-12 MED ORDER — INSULIN GLARGINE 100 UNIT/ML SOLOSTAR PEN: 18.0000 [IU] | PEN_INJECTOR | Freq: Every day | SUBCUTANEOUS | Status: DC

## 2018-12-12 MED ORDER — ENSURE ENLIVE PO LIQD: 237.0000 mL | Freq: Two times a day (BID) | ORAL | Status: DC

## 2018-12-12 NOTE — Procedures (Signed)
PROCEDURE SUMMARY:  Successful image-guided left thoracentesis. Yielded 500 milliliters of clear gold fluid. Patient tolerated procedure well. No immediate complications. EBL = none.  Specimen was sent for labs. CXR ordered.  Claris Pong Leandre Wien PA-C 12/12/2018 9:45 AM

## 2018-12-12 NOTE — Progress Notes (Signed)
Inpatient Diabetes Program Recommendations  AACE/ADA: New Consensus Statement on Inpatient Glycemic Control (2015)  Target Ranges:  Prepandial:   less than 140 mg/dL      Peak postprandial:   less than 180 mg/dL (1-2 hours)      Critically ill patients:  140 - 180 mg/dL   Lab Results  Component Value Date   GLUCAP 257 (H) 12/12/2018   HGBA1C 13.8 (H) 12/11/2018    Review of Glycemic Control  Diabetes history: DM2 Outpatient Diabetes medications: Lantus 18 units QHS, Humalog 10-12 units in am Current orders for Inpatient glycemic control: Lantus 9 units QHS, Novolog 0-9 units Q4H  HgbA1C - 13.8% - uncontrolled DM PTA  Inpatient Diabetes Program Recommendations:     Increase Lantus to 12 units QHS Increase Novolog to 0-15 units Q4H  Speak with pt regarding her Hgba1C today.  Will continue to follow.  Thank you. Lorenda Peck, RD, LDN, CDE Inpatient Diabetes Coordinator [phone]

## 2018-12-12 NOTE — Progress Notes (Signed)
Initial Nutrition Assessment  DOCUMENTATION CODES:   Not applicable  INTERVENTION:  - Will order Ensure Enlive BID PRN, each supplement provides 350 kcal and 20 grams of protein. - Continue to encourage PO intakes.   NUTRITION DIAGNOSIS:   Inadequate oral intake related to decreased appetite as evidenced by per patient/family report.  GOAL:   Patient will meet greater than or equal to 90% of their needs  MONITOR:   PO intake, Supplement acceptance, Weight trends, Labs, Skin  REASON FOR ASSESSMENT:   Malnutrition Screening Tool  ASSESSMENT:   68 y.o. female with medical history significant for insulin-dependent DM, CHF, CVA, history of breast cancer s/p lumpectomy followed by chemo and radiation, and CAD. She presented to the ED for evaluation of rectal pain, decreased appetite, and unintentional weight loss. She reports decreased appetite and general malaise over the past couple months with 20 lb weight loss during this time frame.  She has also developed worsening rectal pain over the past 2 days and reports a swelling around her anus.   BMI indicates normal weight. Patient has been NPO since midnight; she was on Carb Modified from 5:45 PM 2/12 until midnight with no PO intakes during that time. Patient reports no abdominal pain or nausea at this time, but is having soreness and pain at incision site. She reports that over the past few months she has been feeling tired and has not felt up to eating as large of meals as she typically does. She was still eating meals 2-3x/day but they were smaller than what was usual for her.   Per chart review, current weight is 134 lb and weight on 04/26/18 was 133 lb. On 12/12/17 she weighed 135 lb. Patient believes that she has lost 20 lb over the past few months; but unable to confirm this with weights in the chart. Will order Ensure in case patient does not feel like eating a meal.    Medications reviewed; sliding scale novolog, 15 units  lantus/day, 40 mEq K-Dur x1 dose 2/13, 25 mg aldactone/day.  Labs reviewed; CBGs: 255, 257, and 127 mg/dl today, Na: 132 mmol/l, K: 3.3 mmol/l, Ca: 8.5 mg/dl.     NUTRITION - FOCUSED PHYSICAL EXAM:  Completed; no muscle and no fat wasting, no edema at this time.   Diet Order:   Diet Order            Diet NPO time specified Except for: Ice Chips, Sips with Meds  Diet effective midnight              EDUCATION NEEDS:   No education needs have been identified at this time  Skin:  Skin Assessment: Skin Integrity Issues: Skin Integrity Issues:: Incisions Incisions: perineum (2/12)  Last BM:  2/10 (PTA)  Height:   Ht Readings from Last 1 Encounters:  12/10/18 5\' 5"  (1.651 m)    Weight:   Wt Readings from Last 1 Encounters:  12/12/18 60.9 kg    Ideal Body Weight:  56.82 kg  BMI:  Body mass index is 22.34 kg/m.  Estimated Nutritional Needs:   Kcal:  1525-1705 kcal  Protein:  70-80 grams  Fluid:  >/= 1.7 L/day     Jarome Matin, MS, RD, LDN, [facility] Inpatient Clinical Dietitian Pager # [phone] After hours/weekend pager # [phone]

## 2018-12-12 NOTE — Progress Notes (Signed)
PROGRESS NOTE  Marisa Forbes ZOX:[id number] DOB: 12-25-1950 DOA: 12/10/2018 PCP: Patient, No Pcp Per  Brief History   Marisa Forbes a 69 y.o.femalewith medical history significant forinsulin-dependent diabetes mellitus, chronic combined systolic and diastolic CHF, history of CVA, history of breast cancer status post lumpectomy followed by chemotherapy and radiation, and coronary artery disease, now presenting to the emergency department for evaluation of rectal pain, decreased appetite, and unintentional weight loss.Patient reports insidious development of loss of appetite and general malaise over the past couple months. She reports losing approximately 20 pounds over this interval. She has also developed worsening rectal pain over the past 2 days and reports a swelling around her anus. She denies any significant shortness of breath or cough, denies chest pain, and has not noted any fevers or chills. She denies any gross hematuria.  Urology was consulted and ordered an MRI to evaluate the renal mass. Radiology reported the mass as a hemorrhagic cyst.   General surgery was consulted regarding the patient's perirectal swelling and pain. The patient underwent operative I&D on 12/11/2018. Cultures and gram stains have been collected, but have had no growth. Will monitor. The patient is receiving IV rocephin and flagyl and daily wound care to the area,    A & P  Perirectal abscess : Presents with rectal pain and swelling; CT findings suggestive of proctitis with perirectal abscess. The patient has undergone operative incision and drainage. Cultures have been taken and have demonstrated abundant staph aureus. Susceptibilities are pending. She is receiving IV Rocephin and flagyl.  Left renal mass: Urology assistance is appreciated. MRI report read this out as a hemorrhagic cyst. Follow radiologically.  Pleural effusions: Pt underwent thoracentesis today. The fluid appears to be a  transudate, and is likely due to CHF. Cultures were collected and have had no growth as of yet. there is no evidence of cirrhosis.  Chronic combined systolic & diastolic CHF: Despite the presence of pleural effusions, the patient appears to be a little hypovolemic. She has received cautious IV fluids. Monitor volume status carefully.   CAD: Noted and stable. The patient is being continued on her ASA, beta-blocker, nitrates, and statin as at home.  History of CVA: Head CT with no acute findings. Pt is receiving ASA and statin as at home.  Insulin-dependent DM: A1c was 14.3% one year ago. At home the patient receives Lantus 18 units qHS and Humalog 10-12 units qAM. Here she will receive Lantus 15 units daily and correction insulin. She is on a diabetic diet and hypoglycemic protocol.  DVT prophylaxis: SCD's  Code Status: Full  Family Communication: Husband and daughter updated at bedside Consults called: Surgery Admission status: Observation    Marisa Zoeller, DO [facility] Direct contact: see [email or web address]  7PM-7AM contact night coverage as above 12/12/2018, 4:46 PM  LOS: 0 days   Consultants  . Urology, interventional radiology, and general surgery  Procedures  . Thoracentesis and I & D of perirectal abscess  Antibiotics  . Rocephin and Flagyl  Interval History/Subjective  The patient is resting comfortably. No new complaints.  Objective   Vitals:  Vitals:   12/12/18 0942 12/12/18 1153  BP: 122/85 129/80  Pulse:  84  Resp:  19  Temp:  98.2 F (36.8 C)  SpO2:  97%    Exam:  Constitutional:  . Awake, alert, and oriented x 3. No acute distress. Respiratory:  . No wheezes, rales, or rhonchi. . No tactile fremitus.  .  No increased work of breathing.  Cardiovascular:  . Regular rate and rhythm. No murmurs, ectopy, or gallups. . No LE extremity edema   . Normal pedal pulses Abdomen:  . Soft, non-tender, non-distended. . No hernias, masses, or organomegaly  is appreciated. . Normoactive bowel sounds. Musculoskeletal:  . No cyanosis, clubbing or edema. Skin:  . No rashes, lesions, ulcers . palpation of skin: no induration or nodules Neurologic:  . CN 2-12 intact . Sensation all 4 extremities intact Psychiatric:  . Mental status o Mood, affect appropriate o Orientation to person, place, time  . judgment and insight appear intact     I have personally reviewed the following:   Today's Data  . Cultures, MRI report, CBC, Chemistry, Pleural fluid studies.   Scheduled Meds: . heparin  5,000 Units Subcutaneous Q8H  . insulin aspart  0-9 Units Subcutaneous Q4H  . insulin glargine  15 Units Subcutaneous QHS  . lidocaine      . sacubitril-valsartan  1 tablet Oral BID  . spironolactone  25 mg Oral Daily   Continuous Infusions: . cefTRIAXone (ROCEPHIN)  IV 2 g (12/12/18 1214)  . metronidazole Stopped (12/12/18 0530)    Principal Problem:   Perirectal abscess Active Problems:   CAD (coronary artery disease)   History of completed stroke   DM (diabetes mellitus), type 2 (HCC)   Chronic combined systolic (congestive) and diastolic (congestive) heart failure (HCC)   Left renal mass   Pleural effusion   Lower urinary tract infectious disease   Proctitis   Unintentional weight loss   Peri-rectal abscess   LOS: 0 days

## 2018-12-12 NOTE — Progress Notes (Signed)
Inpatient Diabetes Program Recommendations  AACE/ADA: New Consensus Statement on Inpatient Glycemic Control (2015)  Target Ranges:  Prepandial:   less than 140 mg/dL      Peak postprandial:   less than 180 mg/dL (1-2 hours)      Critically ill patients:  140 - 180 mg/dL   Lab Results  Component Value Date   GLUCAP 97 12/12/2018   HGBA1C 13.8 (H) 12/11/2018    Review of Glycemic Control  Spoke with pt and husband about HgbA1C of 13.8%. Pt states she takes her insulin and checks her blood sugars daily. Said HgbA1C was elevated because "I eat sweets." Has lost 20 pounds in the past couple months. Pt states she has appt with new MD in the next couple of weeks. States she did not like PCP and has wanted to change. Husband came out of room and states pt is NOT taking insulin or checking blood sugars. States she is depressed because she feels bad all the time. Husband states he never sees any evidence of insulin needles or monitoring lancets.  Pt states she has no problems in obtaining insulin or supplies, although told RN that insulin supplies are expensive. Pt also states she does not change insulin pen needle after giving her insulin. Uses same needle over and over. Instructed to change insulin pen needles every time she gives herself an injection.  May benefit from psych consult for depression.  Continue to follow.  Thank you. Lorenda Peck, RD, LDN, CDE Inpatient Diabetes Coordinator [phone]

## 2018-12-12 NOTE — Discharge Instructions (Signed)
Anorectal Abscess °An abscess is an infected area that contains a collection of pus. An anorectal abscess is an abscess that is near the opening of the anus or around the rectum. Without treatment, an anorectal abscess can become larger and cause other problems, such as a more serious body-wide infection or pain, especially during bowel movements. °What are the causes? °This condition is caused by plugged glands or an infection in one of these areas: °· The anus. °· The area between the anus and the scrotum in males or between the anus and the vagina in females (perineum). °What increases the risk? °The following factors may make you more likely to develop this condition: °· Diabetes or inflammatory bowel disease. °· Having a body defense system (immune system) that is weak. °· Engaging in anal sex. °· Having a sexually transmitted infection (STI). °· Certain kinds of cancer, such as rectal carcinoma, leukemia, or lymphoma. °What are the signs or symptoms? °The main symptom of this condition is pain. The pain may be a throbbing pain that gets worse during bowel movements. Other symptoms include: °· Swelling and redness in the area of the abscess. The redness may go beyond the abscess and appear as a red streak on the skin. °· A visible, painful lump, or a lump that can be felt when touched. °· Bleeding or pus-like discharge from the area. °· Fever. °· General weakness. °· Constipation. °· Diarrhea. °How is this diagnosed? °This condition is diagnosed based on your medical history and a physical exam of the affected area. °· This may involve examining the rectal area with a gloved hand (digital rectal exam). °· Sometimes, the health care provider needs to look into the rectum using a probe, scope, or imaging test. °· For women, it may require a careful vaginal exam. °How is this treated? °Treatment for this condition may include: °· Incision and drainage surgery. This involves making an incision over the abscess to  drain the pus. °· Medicines, including antibiotic medicine, pain medicine, stool softeners, or laxatives. °Follow these instructions at home: °Medicines °· Take over-the-counter and prescription medicines only as told by your health care provider. °· If you were prescribed an antibiotic medicine, use it as told by your health care provider. Do not stop using the antibiotic even if you start to feel better. °· Do not drive or use heavy machinery while taking prescription pain medicine. °Wound care ° °· If gauze was used in the abscess, follow instructions from your health care provider about removing or changing the gauze. It can usually be removed in 2-3 days. °· Wash your hands with soap and water before you remove or change your gauze. If soap and water are not available, use hand sanitizer. °· If one or more drains were placed in the abscess cavity, be careful not to pull at them. Your health care provider will tell you how long they need to remain in place. °· Check your incision area every day for signs of infection. Check for: °? More redness, swelling, or pain. °? More fluid or blood. °? Warmth. °? Pus or a bad smell. °Managing pain, stiffness, and swelling ° °· Take a sitz bath 3-4 times a day and after bowel movements. This will help reduce pain and swelling. °· To relieve pain, try sitting: °? On a heating pad with the setting on low. °? On an inflatable donut-shaped cushion. °· If directed, put ice on the affected area: °? Put ice in a plastic bag. °? Place   a towel between your skin and the bag. °? Leave the ice on for 20 minutes, 2-3 times a day. °General instructions °· Follow any diet instructions given by your health care provider. °· Keep all follow-up visits as told by your health care provider. This is important. °Contact a health care provider if you have: °· Bleeding from your incision. °· Pain, swelling, or redness that does not improve or gets worse. °· Trouble passing stool or  urine. °· Symptoms that return after treatment. °Get help right away if you: °· Have problems moving or using your legs. °· Have severe or increasing pain. °· Have swelling in the affected area that suddenly gets worse. °· Have a large increase in bleeding or passing of pus. °· Develop chills or a fever. °Summary °· An anorectal abscess is an abscess that is near the opening of the anus or around the rectum. An abscess is an infected area that contains a collection of pus. °· The main symptom of this condition is pain. It may be a throbbing pain that gets worse during bowel movements. °· Treatment for an anorectal abscess may include surgery to drain the pus from the abscess. Medicines and sitz baths may also be a part of your treatment plan. °This information is not intended to replace advice given to you by your health care provider. Make sure you discuss any questions you have with your health care provider. °Document Released: 10/13/2000 Document Revised: 11/22/2017 Document Reviewed: 11/22/2017 °Elsevier Interactive Patient Education © 2019 [employer] ° ° °How to Take a Sitz Bath °A sitz bath is a warm water bath that may be used to care for your rectum, genital area, or the area between your rectum and genitals (perineum). For a sitz bath, the water only comes up to your hips and covers your buttocks. A sitz bath may done at home in a bathtub or with a portable sitz bath that fits over the toilet. °Your health care provider may recommend a sitz bath to help: °· Relieve pain and discomfort after delivering a baby. °· Relieve pain and itching from hemorrhoids or anal fissures. °· Relieve pain after certain surgeries. °· Relax muscles that are sore or tight. °How to take a sitz bath °Take 3-4 sitz baths a day, or as many as told by your health care provider. °Bathtub sitz bath °To take a sitz bath in a bathtub: °1. Partially fill a bathtub with warm water. The water should be deep enough to cover your hips and  buttocks when you are sitting in the tub. °2. If your health care provider told you to put medicine in the water, follow his or her instructions. °3. Sit in the water. °4. Open the tub drain a little, and leave it open during your bath. °5. Turn on the warm water again, enough to replace the water that is draining out. Keep the water running throughout your bath. This helps keep the water at the right level and the right temperature. °6. Soak in the water for 15-20 minutes, or as long as told by your health care provider. °7. When you are done, be careful when you stand up. You may feel dizzy. °8. After the sitz bath, pat yourself dry. Do not rub your skin to dry it. ° °Over-the-toilet sitz bath °To take a sitz bath with an over-the-toilet basin: °1. Follow the manufacturer's instructions. °2. Fill the basin with warm water. °3. If your health care provider told you to put medicine in   the water, follow his or her instructions. °4. Sit on the seat. Make sure the water covers your buttocks and perineum. °5. Soak in the water for 15-20 minutes, or as long as told by your health care provider. °6. After the sitz bath, pat yourself dry. Do not rub your skin to dry it. °7. Clean and dry the basin between uses. °8. Discard the basin if it cracks, or according to the manufacturer's instructions. °Contact a health care provider if: °· Your symptoms get worse. Do not continue with sitz baths if your symptoms get worse. °· You have new symptoms. If this happens, do not continue with sitz baths until you talk with your health care provider. °Summary °· A sitz bath is a warm water bath in which the water only comes up to your hips and covers your buttocks. °· A sitz bath may help relieve itching, relieve pain, and relax muscles that are sore or tight in the lower part of your body, including your genital area. °· Take 3-4 sitz baths a day, or as many as told by your health care provider. Soak in the water for 15-20  minutes. °· Do not continue with sitz baths if your symptoms get worse. °This information is not intended to replace advice given to you by your health care provider. Make sure you discuss any questions you have with your health care provider. °Document Released: 07/08/2004 Document Revised: 10/18/2017 Document Reviewed: 10/18/2017 °Elsevier Interactive Patient Education © 2019 [employer] ° °

## 2018-12-12 NOTE — Progress Notes (Signed)
1 Day Post-Op    CC: Perirectal abscess  Subjective: She feels much better today and she just came back from her thoracentesis. I took out half the packing.  It is bloody but not very purulent.  We will work on getting her in a sitz bath and take the remainder of the packing out tomorrow.  Start teaching patient how to take care of her I&D site with sitz baths/showers.  Objective: Vital signs in last 24 hours: Temp:  [97.6 F (36.4 C)-98.6 F (37 C)] 98.2 F (36.8 C) (02/13 0800) Pulse Rate:  [74-93] 86 (02/13 0800) Resp:  [11-18] 18 (02/13 0800) BP: (112-147)/(66-89) 134/89 (02/13 0800) SpO2:  [93 %-100 %] 94 % (02/13 0800) Weight:  [60.9 kg] 60.9 kg (02/13 0449) Last BM Date: 12/09/18 Nothing p.o. recorded 1948 IV Urine x1 Afebrile vital signs are stable Sodium 132, potassium 3.3, glucose 255, WBC 10.1, hemoglobin 13, hematocrit 40 platelets 183K  Intake/Output from previous day: 02/12 0701 - 02/13 0700 In: 1948.1 [I.V.:1648.1; IV Piggyback:300] Out: 5 [Blood:5] Intake/Output this shift: No intake/output data recorded.  General appearance: alert, cooperative and no distress Resp: clear to auscultation bilaterally Skin: Skin color, texture, turgor normal. No rashes or lesions or There is no erythema around the I&D site.  I removed half the packing was fairly bloody but there was a lot of purulent drainage.  Lab Results:  Recent Labs    12/11/18 0605 12/12/18 0600  WBC 9.1 10.1  HGB 13.7 13.0  HCT 42.7 40.2  PLT 181 183    BMET Recent Labs    12/11/18 0605 12/12/18 0600  NA 129* 132*  K 3.4* 3.3*  CL 93* 103  CO2 20* 22  GLUCOSE 335* 255*  BUN 15 19  CREATININE 0.53 0.58  CALCIUM 8.5* 8.5*   PT/INR No results for input(s): LABPROT, INR in the last 72 hours.  Recent Labs  Lab 12/11/18 0605  AST 15  ALT 16  ALKPHOS 93  BILITOT 1.4*  PROT 6.3*  ALBUMIN 3.3*     Lipase  No results found for: LIPASE   Medications: . heparin  5,000 Units  Subcutaneous Q8H  . insulin aspart  0-9 Units Subcutaneous Q4H  . insulin glargine  9 Units Subcutaneous QHS  . lidocaine      . potassium chloride  40 mEq Oral Once   Anti-infectives (From admission, onward)   Start     Dose/Rate Route Frequency Ordered Stop   12/11/18 1400  metroNIDAZOLE (FLAGYL) IVPB 500 mg     500 mg 100 mL/hr over 60 Minutes Intravenous Every 8 hours 12/11/18 0433     12/11/18 1000  cefTRIAXone (ROCEPHIN) 2 g in sodium chloride 0.9 % 100 mL IVPB     2 g 200 mL/hr over 30 Minutes Intravenous Every 24 hours 12/11/18 0433     12/11/18 0415  metroNIDAZOLE (FLAGYL) IVPB 500 mg     500 mg 100 mL/hr over 60 Minutes Intravenous  Once 12/11/18 0400 12/11/18 0547   12/11/18 0100  cefTRIAXone (ROCEPHIN) 1 g in sodium chloride 0.9 % 100 mL IVPB     1 g 200 mL/hr over 30 Minutes Intravenous  Once 12/11/18 0051 12/11/18 0216     Assessment/Plan 5.8 cm left renal mass -Dr. 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 -last ECHO EF 45 to 50% CAD/history of MI with stent Neck CVA Hypertension Pleural effusions  Large perirectal abscess Incision and drainage of large right  perirectal abscess 12/11/2018    FEN:n.p.o./IV fluids PT:CKFWBLTG 2/12 >> day 2, Flagyl 2/12 >> day 2 DVT: Heparin Follow-up: [facility]    Plan: We will start her on sitz bath and take out the remaining portion of the packing tomorrow.  Mobilize, medical management of her diabetes.    LOS: 0 days    Marisa Forbes 12/12/2018 [phone]

## 2018-12-12 NOTE — Consult Note (Signed)
Urology Consult   Physician requesting consult: Dr Myna Hidalgo  Reason for consult: Left renal mass  History of Present Illness: Marisa Forbes is a 68 y.o. female with multiple medical issues, admitted for management of a perirectal abscess.  The patient has had about a 20 pound weight loss over the past few months.  She has history of breast cancer, with adjuvant chemotherapy and radiation, congestive heart failure, history of CVA, diabetes mellitus, coronary artery disease.  On admission, she had CT of the abdomen and pelvis.  This revealed a 5.8 cm left lower pole renal mass.  There was no obvious retroperitoneal adenopathy.  Chest CT revealed bilateral effusions but no evidence of pulmonary metastases.  Urologic consultation is requested.  She denies any gross hematuria, left flank pain.  Past Medical History:  Diagnosis Date  . Arthritis    back  . Balance problems    due to stroke  . Breast cancer ([place]) 12/03/12   a. 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 S/p chemo with CMF and radiation.  . CHF (congestive heart failure) ([place])    On Monday she said her cardiologist was concerned for possible CHF, but is now improving-per pt.(07/26/15)  . Coronary artery disease    a. STEMI 08/2012: s/p DES to LAD, PTCA to downstream LAD. b. Relook cath same hospitalization: stable post-PCI anatomy with Stable Diag lesions (unlikely cause of resting angina, not good PCI targets), stable RCA lesion (non flow limiting).  . Diabetes mellitus without complication ([place])    Takes insulin and metformin  . GERD (gastroesophageal reflux disease)    food related  . History of cancer chemotherapy    last in August 2014  . 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 History of radiation therapy  06/23/2013-08/08/2013   62.4 gray to left breast  . HTN (hypertension)   . Hypercholesteremia   . LV dysfunction    a. EF 45-50% at time of STEMI 08/2012. b. Echo 04/2013: EF 45-50%, mild focal basal hypertrophy of septum, grade 1 d/d.  Marland Kitchen Myocardial infarction ([place]) 09/07/12  . Peripheral vision loss    left  . Renal artery stenosis ([facility])    a. 20% L RAS in 08/2012 by cath. b. Duplex 07/2012: >60% L RAS.  . Stroke, embolic ([place])    a. 33/8250 post cath.  . Vasovagal syncope    a. 04/2013 - echo stable compared to prior EF 45-50%, negative carotid duplex.    Past Surgical History:  Procedure Laterality Date  . BACK SURGERY     x3, lower back  . BREAST BIOPSY Left 12/03/2012   U/S Core- Malignant  . BREAST LUMPECTOMY Left 01/08/2013  . BREAST LUMPECTOMY WITH NEEDLE LOCALIZATION AND AXILLARY SENTINEL LYMPH NODE BX Left 01/08/2013   Procedure: LEFT BREAST WIRE GUIDED  LUMPECTOMY AND LEFT AXILLARY SENTINEL  NODE BX;  Surgeon: Rolm Bookbinder, MD;  Location: [facility];  Service: General;  Laterality: Left;  . CATARACT EXTRACTION Bilateral   . CORONARY ANGIOPLASTY WITH STENT PLACEMENT    . INCISION AND DRAINAGE PERIRECTAL ABSCESS N/A 12/11/2018   Procedure: IRRIGATION AND DEBRIDEMENT PERIRECTAL ABSCESS;  Surgeon: Johnathan Hausen, MD;  Location: [facility];  Service: General;  Laterality: N/A;  . LEFT HEART CATHETERIZATION WITH CORONARY ANGIOGRAM N/A 09/07/2012   Procedure: LEFT HEART CATHETERIZATION WITH CORONARY ANGIOGRAM;  Surgeon:  Troy Sine, MD;  Location: [facility] CATH LAB;  Service: Cardiovascular;  Laterality: N/A;  . LEFT HEART CATHETERIZATION WITH CORONARY ANGIOGRAM  09/09/2012   Procedure: LEFT HEART CATHETERIZATION WITH CORONARY ANGIOGRAM;  Surgeon: Leonie Man, MD;  Location: [facility] CATH LAB;  Service: Cardiovascular;;  . PERCUTANEOUS CORONARY STENT INTERVENTION (PCI-S) N/A 09/07/2012   Procedure: PERCUTANEOUS CORONARY STENT INTERVENTION (PCI-S);  Surgeon: Troy Sine, MD;  Location: [facility]  CATH LAB;  Service: Cardiovascular;  Laterality: N/A;  . PORT-A-CATH REMOVAL N/A 10/14/2013   Procedure: REMOVAL PORT-A-CATH;  Surgeon: Rolm Bookbinder, MD;  Location: [facility];  Service: General;  Laterality: N/A;  . PORTACATH PLACEMENT Right 02/17/2013   Procedure: INSERTION PORT-A-CATH;  Surgeon: Rolm Bookbinder, MD;  Location: [facility];  Service: General;  Laterality: Right;     Current Hospital Medications: Scheduled Meds: . heparin  5,000 Units Subcutaneous Q8H  . insulin aspart  0-9 Units Subcutaneous Q4H  . insulin glargine  9 Units Subcutaneous QHS  . lidocaine       Continuous Infusions: . cefTRIAXone (ROCEPHIN)  IV Stopped (12/11/18 1115)  . metronidazole 500 mg (12/12/18 0525)   PRN Meds:.acetaminophen **OR** acetaminophen, hydrALAZINE, HYDROcodone-acetaminophen, ondansetron **OR** ondansetron (ZOFRAN) IV  Allergies: No Known Allergies  Family History  Problem Relation Age of Onset  . Heart disease Father   . Hypertension Father   . Cancer Neg Hx   . Diabetes Neg Hx   . Hyperlipidemia Neg Hx   . Kidney disease Neg Hx   . Stroke Neg Hx     Social History:  reports that she has never smoked. She has never used smokeless tobacco. She reports current alcohol use. She reports that she does not use drugs.  ROS: A complete review of systems was performed.  All systems are negative except for pertinent findings as noted.  Physical Exam:  Vital signs in last 24 hours: Temp:  [97.6 F (36.4 C)-98.6 F (37 C)] 98.2 F (36.8 C) (02/13 0800) Pulse Rate:  [74-93] 86 (02/13 0800) Resp:  [11-18] 18 (02/13 0800) BP: (112-147)/(66-89) 134/89 (02/13 0800) SpO2:  [93 %-100 %] 94 % (02/13 0800) Weight:  [60.9 kg] 60.9 kg (02/13 0449) General:  Alert and oriented, No acute distress HEENT: Normocephalic, atraumatic Neck: Supple Cardiovascular: Regular rate and rhythm Lungs: Normal inspiratory and expiratory excursion Abdomen: Soft, nontender, nondistended, no abdominal  masses Neurologic: Grossly intact  Laboratory Data:  Recent Labs    12/10/18 1851 12/11/18 0605 12/12/18 0600  WBC 11.8* 9.1 10.1  HGB 14.9 13.7 13.0  HCT 45.6 42.7 40.2  PLT 203 181 183    Recent Labs    12/10/18 1851 12/11/18 0605 12/12/18 0600  NA 133* 129* 132*  K 3.7 3.4* 3.3*  CL 96* 93* 103  GLUCOSE 311* 335* 255*  BUN _0 CALCIUM 9.3 8.5* 8.5*  CREATININE 0.67 0.53 0.58     Results for orders placed or performed during the hospital encounter of 12/10/18 (from the past 24 hour(s))  Glucose, capillary     Status: Abnormal   Collection Time: 12/11/18 10:50 AM  Result Value Ref Range   Glucose-Capillary 271 (H) 70 - 99 mg/dL  Glucose, capillary     Status: Abnormal   Collection Time: 12/11/18 12:06 PM  Result Value Ref Range   Glucose-Capillary 241 (H) 70 - 99 mg/dL  Aerobic/Anaerobic Culture (surgical/deep wound)     Status: None (Preliminary result)   Collection Time: 12/11/18  1:04 PM  Result Value Ref  Range   Specimen Description      ABSCESS PERIRECTAL Performed at [facility] [address], [place], [place] [address]    Special Requests      NONE Performed at [facility], [place] [address] [place], [place] [address]    Gram Stain      ABUNDANT WBC PRESENT,BOTH PMN AND MONONUCLEAR ABUNDANT GRAM POSITIVE COCCI Performed at [facility], [place] [address] [place], [place] [address]    Culture PENDING    Report Status PENDING   Glucose, capillary     Status: Abnormal   Collection Time: 12/11/18  1:33 PM  Result Value Ref Range   Glucose-Capillary 218 (H) 70 - 99 mg/dL  Glucose, capillary     Status: Abnormal   Collection Time: 12/11/18  4:47 PM  Result Value Ref Range   Glucose-Capillary 234 (H) 70 - 99 mg/dL  Glucose, capillary     Status: Abnormal   Collection Time: 12/11/18  8:37 PM  Result Value Ref Range   Glucose-Capillary 278 (H) 70 - 99 mg/dL  Glucose, capillary     Status: Abnormal    Collection Time: 12/12/18 12:31 AM  Result Value Ref Range   Glucose-Capillary 255 (H) 70 - 99 mg/dL  Glucose, capillary     Status: Abnormal   Collection Time: 12/12/18  4:52 AM  Result Value Ref Range   Glucose-Capillary 257 (H) 70 - 99 mg/dL  CBC     Status: None   Collection Time: 12/12/18  6:00 AM  Result Value Ref Range   WBC 10.1 4.0 - 10.5 K/uL   RBC 4.52 3.87 - 5.11 MIL/uL   Hemoglobin 13.0 12.0 - 15.0 g/dL   HCT 40.2 36.0 - 46.0 %   MCV 88.9 80.0 - 100.0 fL   MCH 28.8 26.0 - 34.0 pg   MCHC 32.3 30.0 - 36.0 g/dL   RDW 13.9 11.5 - 15.5 %   Platelets 183 150 - 400 K/uL   nRBC 0.0 0.0 - 0.2 %  Basic metabolic panel     Status: Abnormal   Collection Time: 12/12/18  6:00 AM  Result Value Ref Range   Sodium 132 (L) 135 - 145 mmol/L   Potassium 3.3 (L) 3.5 - 5.1 mmol/L   Chloride 103 98 - 111 mmol/L   CO2 22 22 - 32 mmol/L   Glucose, Bld 255 (H) 70 - 99 mg/dL   BUN 19 8 - 23 mg/dL   Creatinine, Ser 0.58 0.44 - 1.00 mg/dL   Calcium 8.5 (L) 8.9 - 10.3 mg/dL   GFR calc non Af Amer >60 >60 mL/min   GFR calc Af Amer >60 >60 mL/min   Anion gap 7 5 - 15   Recent Results (from the past 240 hour(s))  Aerobic/Anaerobic Culture (surgical/deep wound)     Status: None (Preliminary result)   Collection Time: 12/11/18  1:04 PM  Result Value Ref Range Status   Specimen Description   Final    ABSCESS PERIRECTAL Performed at [facility], [place] [address], [place], [place] [address]    Special Requests   Final    NONE Performed at [facility], [place] [address], [place], [place] [address]    Gram Stain   Final    ABUNDANT WBC PRESENT,BOTH PMN AND MONONUCLEAR ABUNDANT GRAM POSITIVE COCCI Performed at [facility], [place] [address] [place], [place] [address]    Culture PENDING  Incomplete   Report  Status PENDING  Incomplete    Renal Function: Recent Labs    12/10/18 1851 12/11/18 0605 12/12/18 0600  CREATININE 0.67 0.53 0.58    Estimated Creatinine Clearance: 61.4 mL/min (by C-G formula based on SCr of 0.58 mg/dL).  Radiologic Imaging: Ct Head Wo Contrast  Result Date: 12/11/2018 CLINICAL DATA:  Possible recent stroke EXAM: CT HEAD WITHOUT CONTRAST TECHNIQUE: Contiguous axial images were obtained from the base of the skull through the vertex without intravenous contrast. COMPARISON:  04/11/2015 FINDINGS: Brain: Encephalomalacia changes are noted in the right occipital lobe consistent with prior infarct. These changes are stable from prior exam. Scattered chronic white matter ischemic changes are seen. No findings to suggest acute hemorrhage, acute infarction or space-occupying mass lesion noted. Vascular: No hyperdense vessel or unexpected calcification. Skull: Normal. Negative for fracture or focal lesion. Sinuses/Orbits: No acute finding. Other: None. IMPRESSION: Chronic changes without acute abnormality. Electronically Signed   By: Inez Catalina M.D.   On: 12/11/2018 03:15   Ct Chest W Contrast  Result Date: 12/11/2018 CLINICAL DATA:  Rectal pain for 2 days. No bowel movement in 24 hours. 20 lb weight loss. Remote history of left breast cancer. EXAM: CT CHEST, ABDOMEN, AND PELVIS WITH CONTRAST TECHNIQUE: Multidetector CT imaging of the chest, abdomen and pelvis was performed following the standard protocol during bolus administration of intravenous contrast. CONTRAST:  176m ISOVUE-300 IOPAMIDOL (ISOVUE-300) INJECTION 61% COMPARISON:  CT 07/29/2015 FINDINGS: CT CHEST FINDINGS Cardiovascular: Heart is mildly enlarged. Moderate coronary artery calcifications and scattered aortic calcifications. No evidence of aneurysm. Mediastinum/Nodes: No mediastinal, hilar, or axillary adenopathy. Lungs/Pleura: Moderate bilateral pleural effusions. Areas of atelectasis in the lower lobes bilaterally. Scarring in the lingula. No acute confluent airspace opacities or suspicious pulmonary nodules. Musculoskeletal: Chest wall soft tissues are  unremarkable. No acute bony abnormality. CT ABDOMEN PELVIS FINDINGS Hepatobiliary: No focal hepatic abnormality. Gallbladder unremarkable. Pancreas: No focal abnormality or ductal dilatation. Spleen: No focal abnormality.  Normal size. Adrenals/Urinary Tract: 5.8 x 4.8 x 4.5 cm mixed density mass off the lower pole of the left kidney. This has enlarged slightly when compared to prior study when this measured 5.1 x 4.5 x 4.4 cm. Cannot exclude slowly growing renal neoplasm. No hydronephrosis. Adrenal glands unremarkable. Gas noted within the urinary bladder, presumably from recent catheterization. No bladder wall abnormality. Stomach/Bowel: There appears to be circumferential wall thickening noted in the region of the anus/lower rectum. There also appears to be fluid collection posterior to the rectum on image 124 concerning for perirectal abscess. This measures up to 3.3 cm. This also extends to the right of the rectum/anus. No evidence of bowel obstruction. Moderate stool burden in the colon. Vascular/Lymphatic: Aortic atherosclerosis. No enlarged abdominal or pelvic lymph nodes. Reproductive: Uterus and adnexa unremarkable.  No mass. Other: No free fluid or free air. Musculoskeletal: No acute bony abnormality. IMPRESSION: Circumferential wall thickening in the region of the rectum/anus concerning for proctitis. Fluid collection noted posterior and to the right of the lower rectum and anus concerning for perirectal abscess. Mixed density mass noted off the lower pole of the left kidney measuring up to 5.8 cm, slowly enlarging since 2014 when this measured up to 5.1 cm. Cannot exclude slowly enlarging renal cell neoplasm. Recommend urologic consultation. Moderate bilateral pleural effusions. Compressive atelectasis in the lower lobes. Cardiomegaly, coronary artery disease. Electronically Signed   By: KRolm BaptiseM.D.   On: 12/11/2018 03:39   Ct Abdomen Pelvis W Contrast  Result Date: 12/11/2018 CLINICAL DATA:  Rectal pain for 2 days. No bowel movement in 24 hours. 20 lb weight loss. Remote history of left breast cancer. EXAM: CT CHEST, ABDOMEN, AND PELVIS WITH CONTRAST TECHNIQUE: Multidetector CT imaging of the chest, abdomen and pelvis was performed following the standard protocol during bolus administration of intravenous contrast. CONTRAST:  157m ISOVUE-300 IOPAMIDOL (ISOVUE-300) INJECTION 61% COMPARISON:  CT 07/29/2015 FINDINGS: CT CHEST FINDINGS Cardiovascular: Heart is mildly enlarged. Moderate coronary artery calcifications and scattered aortic calcifications. No evidence of aneurysm. Mediastinum/Nodes: No mediastinal, hilar, or axillary adenopathy. Lungs/Pleura: Moderate bilateral pleural effusions. Areas of atelectasis in the lower lobes bilaterally. Scarring in the lingula. No acute confluent airspace opacities or suspicious pulmonary nodules. Musculoskeletal: Chest wall soft tissues are unremarkable. No acute bony abnormality. CT ABDOMEN PELVIS FINDINGS Hepatobiliary: No focal hepatic abnormality. Gallbladder unremarkable. Pancreas: No focal abnormality or ductal dilatation. Spleen: No focal abnormality.  Normal size. Adrenals/Urinary Tract: 5.8 x 4.8 x 4.5 cm mixed density mass off the lower pole of the left kidney. This has enlarged slightly when compared to prior study when this measured 5.1 x 4.5 x 4.4 cm. Cannot exclude slowly growing renal neoplasm. No hydronephrosis. Adrenal glands unremarkable. Gas noted within the urinary bladder, presumably from recent catheterization. No bladder wall abnormality. Stomach/Bowel: There appears to be circumferential wall thickening noted in the region of the anus/lower rectum. There also appears to be fluid collection posterior to the rectum on image 124 concerning for perirectal abscess. This measures up to 3.3 cm. This also extends to the right of the rectum/anus. No evidence of bowel obstruction. Moderate stool burden in the colon. Vascular/Lymphatic: Aortic  atherosclerosis. No enlarged abdominal or pelvic lymph nodes. Reproductive: Uterus and adnexa unremarkable.  No mass. Other: No free fluid or free air. Musculoskeletal: No acute bony abnormality. IMPRESSION: Circumferential wall thickening in the region of the rectum/anus concerning for proctitis. Fluid collection noted posterior and to the right of the lower rectum and anus concerning for perirectal abscess. Mixed density mass noted off the lower pole of the left kidney measuring up to 5.8 cm, slowly enlarging since 2014 when this measured up to 5.1 cm. Cannot exclude slowly enlarging renal cell neoplasm. Recommend urologic consultation. Moderate bilateral pleural effusions. Compressive atelectasis in the lower lobes. Cardiomegaly, coronary artery disease. Electronically Signed   By: KRolm BaptiseM.D.   On: 12/11/2018 03:39    I independently reviewed the above imaging studies.  Impression/Assessment:  5.8 cm left renal mass.  This actually was present, in slightly smaller form on CT scan performed in 2016.  There is no evidence of metastatic disease on CT chest/abdomen/pelvis.  This may be a benign tumor i.e. angiomyolipoma (lipid poor), or very slow-growing malignancy.  Plan:  I would recommend MRI of the kidneys with and without contrast to better evaluate this lesion.  If it does not seem to be an angiomyolipoma, I would suggest CT directed biopsy.  If a lesion of low metastatic potential, consider following it radiographically rather than performing extirpation/ablation.  I have ordered the MRI.  I will make sure she has proper follow-up.

## 2018-12-13 ENCOUNTER — Other Ambulatory Visit: Payer: Self-pay | Admitting: Radiology

## 2018-12-13 ENCOUNTER — Inpatient Hospital Stay (HOSPITAL_COMMUNITY): Payer: Medicare Other

## 2018-12-13 DIAGNOSIS — K611 Rectal abscess: Principal | ICD-10-CM

## 2018-12-13 LAB — BASIC METABOLIC PANEL
Anion gap: 10 (ref 5–15)
BUN: 17 mg/dL (ref 8–23)
CO2: 21 mmol/L — ABNORMAL LOW (ref 22–32)
Calcium: 8.7 mg/dL — ABNORMAL LOW (ref 8.9–10.3)
Chloride: 104 mmol/L (ref 98–111)
Creatinine, Ser: 0.47 mg/dL (ref 0.44–1.00)
GFR calc Af Amer: >60 mL/min (ref >60)
GFR calc non Af Amer: >60 mL/min (ref >60)
Glucose, Bld: 178 mg/dL — ABNORMAL HIGH (ref 70–99)
Potassium: 3.5 mmol/L (ref 3.5–5.1)
Sodium: 135 mmol/L (ref 135–145)

## 2018-12-13 LAB — CBC
HCT: 44 % (ref 36.0–46.0)
Hemoglobin: 13.9 g/dL (ref 12.0–15.0)
MCH: 28.1 pg (ref 26.0–34.0)
MCHC: 31.6 g/dL (ref 30.0–36.0)
MCV: 89.1 fL (ref 80.0–100.0)
Platelets: 232 10*3/uL (ref 150–400)
RBC: 4.94 MIL/uL (ref 3.87–5.11)
RDW: 14.1 % (ref 11.5–15.5)
WBC: 8.3 10*3/uL (ref 4.0–10.5)
nRBC: 0 % (ref 0.0–0.2)

## 2018-12-13 LAB — GLUCOSE, CAPILLARY
Glucose-Capillary: 117 mg/dL — ABNORMAL HIGH (ref 70–99)
Glucose-Capillary: 170 mg/dL — ABNORMAL HIGH (ref 70–99)
Glucose-Capillary: 176 mg/dL — ABNORMAL HIGH (ref 70–99)
Glucose-Capillary: 203 mg/dL — ABNORMAL HIGH (ref 70–99)
Glucose-Capillary: 224 mg/dL — ABNORMAL HIGH (ref 70–99)
Glucose-Capillary: 244 mg/dL — ABNORMAL HIGH (ref 70–99)
Glucose-Capillary: 280 mg/dL — ABNORMAL HIGH (ref 70–99)

## 2018-12-13 MED ORDER — SENNA 8.6 MG PO TABS
1.0000 | ORAL_TABLET | Freq: Two times a day (BID) | ORAL | Status: DC
  Filled 2018-12-13: qty 1

## 2018-12-13 MED ORDER — LIDOCAINE HCL 1 % IJ SOLN
INTRAMUSCULAR | Status: AC
  Filled 2018-12-13: qty 20

## 2018-12-13 MED ORDER — CEFAZOLIN SODIUM-DEXTROSE 2-4 GM/100ML-% IV SOLN
2.0000 g | Freq: Three times a day (TID) | INTRAVENOUS | Status: DC
  Administered 2018-12-14: 2 g via INTRAVENOUS
  Filled 2018-12-13 (×2): qty 100

## 2018-12-13 MED ORDER — POLYETHYLENE GLYCOL 3350 17 G PO PACK
17.0000 g | PACK | Freq: Every day | ORAL | Status: DC
  Administered 2018-12-13: 17 g via ORAL
  Filled 2018-12-13: qty 1

## 2018-12-13 NOTE — Progress Notes (Signed)
[facility] Progress Note  2 Days Post-Op  Subjective: CC: perirectal abscess Patient reports occasional pain that is not severe. Started sitz baths started yesterday. Patient tolerating diet and feels much better.   Objective: Vital signs in last 24 hours: Temp:  [97.8 F (36.6 C)-98.9 F (37.2 C)] 97.8 F (36.6 C) (02/14 0814) Pulse Rate:  [83-92] 83 (02/14 0814) Resp:  [16-19] 16 (02/14 0814) BP: (121-130)/(69-82) 130/82 (02/14 0814) SpO2:  [97 %-98 %] 98 % (02/14 0814) Weight:  [58.9 kg] 58.9 kg (02/14 0442) Last BM Date: 12/09/18  Intake/Output from previous day: 02/13 0701 - 02/14 0700 In: 424.3 [IV Piggyback:424.3] Out: -  Intake/Output this shift: No intake/output data recorded.  PE: Gen:  Alert, NAD, pleasant Card:  Regular rate and rhythm Pulm:  Normal effort Abd: Soft, non-tender, non-distended, +BS GU: removed remaining packing with bloody-purulent draining, surrounding erythema and induration improving  Lab Results:  Recent Labs    12/12/18 0600 12/13/18 0554  WBC 10.1 8.3  HGB 13.0 13.9  HCT 40.2 44.0  PLT 183 232   BMET Recent Labs    12/12/18 0600 12/13/18 0554  NA 132* 135  K 3.3* 3.5  CL 103 104  CO2 22 21*  GLUCOSE 255* 178*  BUN 19 17  CREATININE 0.58 0.47  CALCIUM 8.5* 8.7*   PT/INR No results for input(s): LABPROT, INR in the last 72 hours. CMP     Component Value Date/Time   NA 135 12/13/2018 0554   NA 136 04/26/2018 1429   NA 141 08/14/2014 1024   K 3.5 12/13/2018 0554   K 4.3 08/14/2014 1024   CL 104 12/13/2018 0554   CL 110 (H) 04/10/2013 1020   CO2 21 (L) 12/13/2018 0554   CO2 27 08/14/2014 1024   GLUCOSE 178 (H) 12/13/2018 0554   GLUCOSE 207 (H) 08/14/2014 1024   GLUCOSE 109 (H) 04/10/2013 1020   BUN 17 12/13/2018 0554   BUN 13 04/26/2018 1429   BUN 9.2 08/14/2014 1024   CREATININE 0.47 12/13/2018 0554   CREATININE 0.81 04/07/2016 1259   CREATININE 0.7 08/14/2014 1024   CALCIUM 8.7 (L)  12/13/2018 0554   CALCIUM 10.1 08/14/2014 1024   PROT 6.3 (L) 12/11/2018 0605   PROT 6.4 04/26/2018 1429   PROT 6.2 (L) 08/14/2014 1024   ALBUMIN 3.3 (L) 12/11/2018 0605   ALBUMIN 4.1 04/26/2018 1429   ALBUMIN 3.5 08/14/2014 1024   AST 15 12/11/2018 0605   AST 10 08/14/2014 1024   ALT 16 12/11/2018 0605   ALT 14 08/14/2014 1024   ALKPHOS 93 12/11/2018 0605   ALKPHOS 80 08/14/2014 1024   BILITOT 1.4 (H) 12/11/2018 0605   BILITOT 0.6 04/26/2018 1429   BILITOT 0.54 08/14/2014 1024   GFRNONAA >60 12/13/2018 0554   GFRAA >60 12/13/2018 0554   Lipase  No results found for: LIPASE     Studies/Results: Dg Chest 1 View  Result Date: 12/12/2018 CLINICAL DATA:  Status post left thoracentesis EXAM: CHEST  1 VIEW COMPARISON:  12/11/2018 chest CT FINDINGS: Top-normal heart size. Normal mediastinal contour. No pneumothorax. Small bilateral pleural effusions, decreased on the left. Surgical clips overlie the left breast. No pulmonary edema. Stable mild scarring versus atelectasis at the left lung base. IMPRESSION: 1. No pneumothorax. 2. Small left pleural effusion, decreased. Stable small right pleural effusion. 3. Stable mild left basilar scarring versus atelectasis. Electronically Signed   By: Ilona Sorrel M.D.   On: 12/12/2018 10:37   Mr Abdomen W  Wo Contrast  Result Date: 12/12/2018 CLINICAL DATA:  68 year old female with history of left-sided breast cancer. Indeterminate lesion in the lower pole the left kidney noted on prior CT examination. Follow-up study. EXAM: MRI ABDOMEN WITHOUT AND WITH CONTRAST TECHNIQUE: Multiplanar multisequence MR imaging of the abdomen was performed both before and after the administration of intravenous contrast. CONTRAST:  6 mL of Gadavist. COMPARISON:  No prior abdominal MRI. CT the abdomen and pelvis 12/11/2018. FINDINGS: Lower chest: Moderate right and small left pleural effusions lying dependently. Mild cardiomegaly. Hepatobiliary: No suspicious cystic or solid  hepatic lesions. No intra or extrahepatic biliary ductal dilatation. Gallbladder is normal in appearance. Pancreas: No pancreatic mass. No pancreatic ductal dilatation. No pancreatic or peripancreatic fluid or inflammatory changes. Spleen:  Unremarkable. Adrenals/Urinary Tract: The lesion of concern in the lower pole of the left kidney measures 4.5 x 4.2 x 5.4 cm (axial image 74 of series 900 and coronal image 34 of series 10) and is heterogeneous in signal intensity but predominantly T1 hyperintense, predominantly T2 hypointense and demonstrates no definitive internal enhancement on post gadolinium images. Right kidney and bilateral adrenal glands are normal in appearance. No hydroureteronephrosis in the visualized portions of the abdomen. Stomach/Bowel: Visualized portions are unremarkable. Vascular/Lymphatic: No aneurysm identified in the visualized abdominal vasculature. No lymphadenopathy noted in the abdomen. Other: No significant volume of ascites noted in the visualized portions of the peritoneal cavity. Musculoskeletal: No aggressive appearing osseous lesions are noted in the visualized portions of the skeleton. IMPRESSION: 1. The lesion of concern in the lower pole of the left kidney has imaging characteristics compatible with a hemorrhagic cyst. 2. Moderate right and small left pleural effusions lying dependently. 3. Mild cardiomegaly. Electronically Signed   By: Vinnie Langton M.D.   On: 12/12/2018 12:13   US Thoracentesis Asp Pleural Space W/img Guide  Result Date: 12/12/2018 INDICATION: Patient with history of breast cancer, dyspnea, and bilateral pleural effusions. Request is made for diagnostic and therapeutic left thoracentesis. EXAM: ULTRASOUND GUIDED DIAGNOSTIC AND THERAPEUTIC LEFT THORACENTESIS MEDICATIONS: 8 mL of 1% lidocaine COMPLICATIONS: None immediate. PROCEDURE: An ultrasound guided thoracentesis was thoroughly discussed with the patient and questions answered. The benefits, risks,  alternatives and complications were also discussed. The patient understands and wishes to proceed with the procedure. Written consent was obtained. Ultrasound was performed to localize and mark an adequate pocket of fluid in the left chest. The area was then prepped and draped in the normal sterile fashion. 1% Lidocaine was used for local anesthesia. Under ultrasound guidance a 6 Fr Safe-T-Centesis catheter was introduced. Thoracentesis was performed. The catheter was removed and a dressing applied. FINDINGS: A total of approximately 500 mL of clear gold fluid was removed. Samples were sent to the laboratory as requested by the clinical team. IMPRESSION: Successful ultrasound guided left thoracentesis yielding 500 mL of pleural fluid. Read by: Earley Abide, PA-C Electronically Signed   By: Jerilynn Mages.  Shick M.D.   On: 12/12/2018 10:53    Anti-infectives: Anti-infectives (From admission, onward)   Start     Dose/Rate Route Frequency Ordered Stop   12/11/18 1400  metroNIDAZOLE (FLAGYL) IVPB 500 mg     500 mg 100 mL/hr over 60 Minutes Intravenous Every 8 hours 12/11/18 0433     12/11/18 1000  cefTRIAXone (ROCEPHIN) 2 g in sodium chloride 0.9 % 100 mL IVPB     2 g 200 mL/hr over 30 Minutes Intravenous Every 24 hours 12/11/18 0433     12/11/18 0415  metroNIDAZOLE (FLAGYL) IVPB  500 mg     500 mg 100 mL/hr over 60 Minutes Intravenous  Once 12/11/18 0400 12/11/18 0547   12/11/18 0100  cefTRIAXone (ROCEPHIN) 1 g in sodium chloride 0.9 % 100 mL IVPB     1 g 200 mL/hr over 30 Minutes Intravenous  Once 12/11/18 0051 12/11/18 0216       Assessment/Plan 5.8 cm left renal mass -Dr. 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

## 2018-12-13 NOTE — Progress Notes (Signed)
I reviewed the MRI reading and images.  She has a hemorrhagic cyst in the left lower pole.  No further evaluation or management needs to be done.  I called the patient with the results.

## 2018-12-13 NOTE — Procedures (Signed)
PROCEDURE SUMMARY:  Successful US guided right thoracentesis. Yielded 550 mL of clear, dark yellow fluid. Pt tolerated procedure well. No immediate complications.  Specimen was not sent for labs. CXR ordered.  EBL < 5 mL  Ascencion Dike PA-C 12/13/2018 1:11 PM

## 2018-12-13 NOTE — Evaluation (Signed)
Physical Therapy Evaluation Patient Details Name: Marisa Forbes MRN: [id number] DOB: 1951-04-11 Today's Date: 12/13/2018   History of Present Illness  68 y.o. female with medical history significant for insulin-dependent diabetes mellitus, chronic combined systolic and diastolic CHF, history of CVA, history of breast cancer status post lumpectomy followed by chemotherapy and radiation, and coronary artery disease, now presenting to the emergency department for evaluation of rectal pain, decreased appetite, and unintentional weight loss. s/p I&D of perirectal abscess 12/11/18.  Clinical Impression  Pt is modified independent with mobility, she ambulated 180' holding the IV pole, no loss of balance. At baseline pt is independent with mobility. Encouraged pt to use her cane at home if extra support is needed. From PT standpoint, pt is ready to DC home. No further PT needed, will sign off.     Follow Up Recommendations No PT follow up    Equipment Recommendations  None recommended by PT    Recommendations for Other Services       Precautions / Restrictions Precautions Precautions: None Precaution Comments: denies falls in past 1 year Restrictions Weight Bearing Restrictions: No      Mobility  Bed Mobility Overal bed mobility: Independent                Transfers Overall transfer level: Independent                  Ambulation/Gait Ambulation/Gait assistance: Modified independent (Device/Increase time) Gait Distance (Feet): 180 Feet Assistive device: IV Pole Gait Pattern/deviations: WFL(Within Functional Limits) Gait velocity: WFL   General Gait Details: steady with IV pole  Stairs            Wheelchair Mobility    Modified Rankin (Stroke Patients Only)       Balance Overall balance assessment: Modified Independent                                           Pertinent Vitals/Pain Pain Assessment: 0-10 Pain Score: 2  Pain  Location: perirectal incision Pain Descriptors / Indicators: Sore Pain Intervention(s): Limited activity within patient's tolerance;Monitored during session    Home Living Family/patient expects to be discharged to:: Private residence Living Arrangements: Spouse/significant other Available Help at Discharge: Family;Available 24 hours/day Type of Home: House Home Access: Stairs to enter Entrance Stairs-Rails: Right Entrance Stairs-Number of Steps: 2 Home Layout: One level Home Equipment: Cane - single point;Grab bars - tub/shower      Prior Function Level of Independence: Independent               Hand Dominance        Extremity/Trunk Assessment   Upper Extremity Assessment Upper Extremity Assessment: Overall WFL for tasks assessed    Lower Extremity Assessment Lower Extremity Assessment: Overall WFL for tasks assessed    Cervical / Trunk Assessment Cervical / Trunk Assessment: Kyphotic  Communication   Communication: No difficulties  Cognition Arousal/Alertness: Awake/alert Behavior During Therapy: WFL for tasks assessed/performed Overall Cognitive Status: Within Functional Limits for tasks assessed                                        General Comments      Exercises     Assessment/Plan    PT Assessment Patent does not need any further PT  services  PT Problem List         PT Treatment Interventions      PT Goals (Current goals can be found in the Care Plan section)  Acute Rehab PT Goals PT Goal Formulation: All assessment and education complete, DC therapy    Frequency     Barriers to discharge        Co-evaluation               AM-PAC PT "6 Clicks" Mobility  Outcome Measure Help needed turning from your back to your side while in a flat bed without using bedrails?: None Help needed moving from lying on your back to sitting on the side of a flat bed without using bedrails?: None Help needed moving to and from a  bed to a chair (including a wheelchair)?: None Help needed standing up from a chair using your arms (e.g., wheelchair or bedside chair)?: None Help needed to walk in hospital room?: None Help needed climbing 3-5 steps with a railing? : A Little 6 Click Score: 23    End of Session Equipment Utilized During Treatment: Gait belt Activity Tolerance: Patient tolerated treatment well Patient left: in bed;with call bell/phone within reach;with family/visitor present Nurse Communication: Mobility status      Time: 2575-0518 PT Time Calculation (min) (ACUTE ONLY): 19 min   Charges:   PT Evaluation $PT Eval Low Complexity: 1 Low         Philomena Doheny PT 12/13/2018  Acute Rehabilitation Services Pager [phone] Office [phone]

## 2018-12-13 NOTE — Progress Notes (Signed)
PROGRESS NOTE  Marisa Forbes GBT:[id number] DOB: Jan 16, 1951 DOA: 12/10/2018 PCP: Patient, No Pcp Per  HPI/Recap of past 24 hours: Marisa Forbes a 68 y.o.femalewith medical history significant forinsulin-dependent diabetes mellitus, chronic combined systolic and diastolic CHF, history of CVA, history of breast cancer status post lumpectomy followed by chemotherapy and radiation, and coronary artery disease, now presenting to the emergency department for evaluation of rectal pain, decreased appetite, and unintentional weight loss.Patient reports insidious development of loss of appetite and general malaise over the past couple months. She reports losing approximately 20 pounds over this interval. She has also developed worsening rectal pain over the past 2 days and reports a swelling around her anus. She denies any significant shortness of breath or cough, denies chest pain, and has not noted any fevers or chills. She denies any gross hematuria.  Urology was consulted and ordered an MRI to evaluate the renal mass. Radiology reported the mass as a hemorrhagic cyst.   General surgery was consulted regarding the patient's perirectal swelling and pain. The patient underwent operative I&D on 12/11/2018. Cultures and gram stains have been collected, but have had no growth. Will monitor. The patient is receiving IV rocephin and flagyl and daily wound care to the area.  12/13/2018: Patient seen and examined at her bedside.  No acute events overnight.  States her pain is well controlled on current pain management.  Left thoracentesis yielded 500 cc fluid.  Right thoracentesis 550 cc of fluid removed.   Assessment/Plan: Principal Problem:   Perirectal abscess Active Problems:   CAD (coronary artery disease)   History of completed stroke   DM (diabetes mellitus), type 2 (HCC)   Chronic combined systolic (congestive) and diastolic (congestive) heart failure (HCC)   Left renal mass  Pleural effusion   Lower urinary tract infectious disease   Proctitis   Unintentional weight loss   Peri-rectal abscess  Perirectal abscess: Presents with rectal pain and swelling; CT findings suggestive of proctitis with perirectal abscess. The patient has undergone operative incision and drainage. Cultures have been taken and have demonstrated abundant staph aureus pansensitive. She is receiving IV Rocephin and flagyl.  Left renal mass: Urology assistance is appreciated. MRI report read this out as a hemorrhagic cyst. Follow radiologically.  Moderate bilateral pleural effusions: Had left thoracentesis on 12/12/2018 with 500 cc of fluid removed.  Right thoracentesis done on 12/13/2018 with 550 cc of fluid removed.   Cultures were collected and have had no growth as of yet.there is no evidence of cirrhosis.  Gram stain no WBC or organism present  Chronic combined systolic & diastolic CHF: Appears euvolemic.  She has received cautious IV fluids. Monitor volume status carefully.   CAD: Noted and stable. The patient is being continued on her ASA, beta-blocker, nitrates, and statin as at home.  History of CVA: Head CT with no acute findings. Pt is receiving ASA and statin as at home.  Insulin-dependent DM2 complicated by hyperglycemia: A1c was 14.3% one year ago. At home the patient receives Lantus 18 units qHS and Humalog 10-12 units qAM. Here she will receive Lantus 15 units daily and correction insulin. She is on a diabetic diet and hypoglycemic protocol.  DVT prophylaxis:Subcu heparin 3 times daily Code Status:Full Family Communication: None at bedside Consults called:Surgery   Consultants   Urology, interventional radiology, and general surgery  Procedures   Thoracentesis and I & D of perirectal abscess  Antibiotics   Rocephin and Flagyl    Objective: Vitals:  12/13/18 0814 12/13/18 1252 12/13/18 1259 12/13/18 1328  BP: 130/82 (!) 147/94 (!) 151/94 (!)  144/89  Pulse: 83   93  Resp: 16     Temp: 97.8 F (36.6 C)   98.1 F (36.7 C)  TempSrc: Oral   Oral  SpO2: 98%   100%  Weight:      Height:        Intake/Output Summary (Last 24 hours) at 12/13/2018 1356 Last data filed at 12/13/2018 0000 Gross per 24 hour  Intake 224.29 ml  Output -  Net 224.29 ml   Filed Weights   12/10/18 1826 12/12/18 0449 12/13/18 0442  Weight: 53.5 kg 60.9 kg 58.9 kg    Exam:  . General: 68 y.o. year-old female well developed well nourished in no acute distress.  Alert and oriented x3. . Cardiovascular: Regular rate and rhythm with no rubs or gallops.  No thyromegaly or JVD noted.   Marland Kitchen Respiratory: Clear to auscultation with no wheezes or rales. Good inspiratory effort. . Abdomen: Soft nontender nondistended with normal bowel sounds x4 quadrants. . Musculoskeletal: No lower extremity edema. 2/4 pulses in all 4 extremities. Marland Kitchen Psychiatry: Mood is appropriate for condition and setting   Data Reviewed: CBC: Recent Labs  Lab 12/10/18 1851 12/11/18 0605 12/12/18 0600 12/13/18 0554  WBC 11.8* 9.1 10.1 8.3  NEUTROABS  --  7.7  --   --   HGB 14.9 13.7 13.0 13.9  HCT 45.6 42.7 40.2 44.0  MCV 88.2 86.6 88.9 89.1  PLT 203 181 183 161   Basic Metabolic Panel: Recent Labs  Lab 12/10/18 1851 12/11/18 0605 12/12/18 0600 12/13/18 0554  NA 133* 129* 132* 135  K 3.7 3.4* 3.3* 3.5  CL 96* 93* 103 104  CO2 22 20* 22 21*  GLUCOSE 311* 335* 255* 178*  BUN 14 15 19 17   CREATININE 0.67 0.53 0.58 0.47  CALCIUM 9.3 8.5* 8.5* 8.7*   GFR: Estimated Creatinine Clearance: 60.6 mL/min (by C-G formula based on SCr of 0.47 mg/dL). Liver Function Tests: Recent Labs  Lab 12/11/18 0605  AST 15  ALT 16  ALKPHOS 93  BILITOT 1.4*  PROT 6.3*  ALBUMIN 3.3*   No results for input(s): LIPASE, AMYLASE in the last 168 hours. No results for input(s): AMMONIA in the last 168 hours. Coagulation Profile: No results for input(s): INR, PROTIME in the last 168  hours. Cardiac Enzymes: No results for input(s): CKTOTAL, CKMB, CKMBINDEX, TROPONINI in the last 168 hours. BNP (last 3 results) No results for input(s): PROBNP in the last 8760 hours. HbA1C: Recent Labs    12/11/18 0605  HGBA1C 13.8*   CBG: Recent Labs  Lab 12/12/18 2049 12/13/18 0012 12/13/18 0436 12/13/18 0739 12/13/18 1141  GLUCAP 211* 244* 176* 117* 170*   Lipid Profile: No results for input(s): CHOL, HDL, LDLCALC, TRIG, CHOLHDL, LDLDIRECT in the last 72 hours. Thyroid Function Tests: No results for input(s): TSH, T4TOTAL, FREET4, T3FREE, THYROIDAB in the last 72 hours. Anemia Panel: No results for input(s): VITAMINB12, FOLATE, FERRITIN, TIBC, IRON, RETICCTPCT in the last 72 hours. Urine analysis:    Component Value Date/Time   COLORURINE YELLOW 12/10/2018 2238   APPEARANCEUR CLOUDY (A) 12/10/2018 2238   LABSPEC 1.025 12/10/2018 2238   PHURINE 5.0 12/10/2018 2238   GLUCOSEU >=500 (A) 12/10/2018 2238   GLUCOSEU >=1000 (A) 06/01/2015 0854   HGBUR SMALL (A) 12/10/2018 2238   BILIRUBINUR NEGATIVE 12/10/2018 2238   KETONESUR 20 (A) 12/10/2018 2238   PROTEINUR 100 (A)  12/10/2018 2238   UROBILINOGEN 0.2 06/01/2015 0854   NITRITE NEGATIVE 12/10/2018 2238   LEUKOCYTESUR LARGE (A) 12/10/2018 2238   Sepsis Labs: @LABRCNTIP (procalcitonin:4,lacticidven:4)  ) Recent Results (from the past 240 hour(s))  Urine culture     Status: Abnormal (Preliminary result)   Collection Time: 12/10/18 10:38 PM  Result Value Ref Range Status   Specimen Description   Final    URINE, RANDOM Performed at [facility] [address] [place], [place] [address]    Special Requests   Final    NONE Performed at [facility], [place] [address], [place], [place] [address]    Culture >=100,000 COLONIES/mL ESCHERICHIA COLI (A)  Final   Report Status PENDING  Incomplete  Aerobic/Anaerobic Culture (surgical/deep wound)     Status: None (Preliminary result)    Collection Time: 12/11/18  1:04 PM  Result Value Ref Range Status   Specimen Description   Final    ABSCESS PERIRECTAL Performed at [facility] [address], [place],  [place] [address]    Special Requests   Final    NONE Performed at [facility], [place] [address], [place], [place] [address]    Gram Stain   Final    ABUNDANT WBC PRESENT,BOTH PMN AND MONONUCLEAR ABUNDANT GRAM POSITIVE COCCI Performed at [facility], [place] [address], [place], [place] [address]    Culture   Final    ABUNDANT STAPHYLOCOCCUS AUREUS NO ANAEROBES ISOLATED; CULTURE IN PROGRESS FOR 5 DAYS    Report Status PENDING  Incomplete   Organism ID, Bacteria STAPHYLOCOCCUS AUREUS  Final      Susceptibility   Staphylococcus aureus - MIC*    CIPROFLOXACIN <=0.5 SENSITIVE Sensitive     ERYTHROMYCIN <=0.25 SENSITIVE Sensitive     GENTAMICIN <=0.5 SENSITIVE Sensitive     OXACILLIN 0.5 SENSITIVE Sensitive     TETRACYCLINE <=1 SENSITIVE Sensitive     VANCOMYCIN <=0.5 SENSITIVE Sensitive     TRIMETH/SULFA <=10 SENSITIVE Sensitive     CLINDAMYCIN <=0.25 SENSITIVE Sensitive     RIFAMPIN <=0.5 SENSITIVE Sensitive     Inducible Clindamycin NEGATIVE Sensitive     * ABUNDANT STAPHYLOCOCCUS AUREUS  Culture, body fluid-bottle     Status: None (Preliminary result)   Collection Time: 12/12/18  9:55 AM  Result Value Ref Range Status   Specimen Description PLEURAL LEFT  Final   Special Requests NONE  Final   Culture   Final    NO GROWTH < 24 HOURS Performed at [facility], [place] [address], [place], [place] [address]    Report Status PENDING  Incomplete  Gram stain     Status: None   Collection Time: 12/12/18  9:55 AM  Result Value Ref Range Status   Specimen Description PLEURAL LEFT  Final   Special Requests NONE  Final   Gram Stain   Final    NO WBC SEEN NO ORGANISMS SEEN Performed at [facility], [place] [address] [place], [place] [address]    Report Status  12/12/2018 FINAL  Final      Studies: Dg Chest Port 1 View  Result Date: 12/13/2018 CLINICAL DATA:  68 y/o  F; status post right thoracentesis. EXAM: PORTABLE CHEST 1 VIEW COMPARISON:  None. FINDINGS: Stable cardiac silhouette given projection and technique. Surgical clips project over the left breast. Diminution of right pleural effusion. No pneumothorax. Stable small left pleural effusion. No focal consolidation. Bones are unremarkable. IMPRESSION: Diminution of  right pleural effusion. No pneumothorax. Stable small left effusion. Electronically Signed   By: Kristine Garbe M.D.   On: 12/13/2018 13:40    Scheduled Meds: . heparin  5,000 Units Subcutaneous Q8H  . insulin aspart  0-9 Units Subcutaneous Q4H  . insulin glargine  15 Units Subcutaneous QHS  . lidocaine      . sacubitril-valsartan  1 tablet Oral BID  . spironolactone  25 mg Oral Daily    Continuous Infusions: . cefTRIAXone (ROCEPHIN)  IV 2 g (12/13/18 1010)  . metronidazole 500 mg (12/13/18 0531)     LOS: 1 day     Kayleen Memos, MD [facility] Pager [phone]  If 7PM-7AM, please contact night-coverage [email or web address] Password [email or web address] 12/13/2018, 1:56 PM

## 2018-12-14 LAB — URINE CULTURE: Culture: >=100000 — AB

## 2018-12-14 LAB — GLUCOSE, CAPILLARY
Glucose-Capillary: 232 mg/dL — ABNORMAL HIGH (ref 70–99)
Glucose-Capillary: 312 mg/dL — ABNORMAL HIGH (ref 70–99)
Glucose-Capillary: 331 mg/dL — ABNORMAL HIGH (ref 70–99)
Glucose-Capillary: 89 mg/dL (ref 70–99)

## 2018-12-14 LAB — CHOLESTEROL, BODY FLUID: Cholesterol, Fluid: 11 mg/dL

## 2018-12-14 MED ORDER — SODIUM CHLORIDE 0.9 % IV SOLN
INTRAVENOUS | Status: DC | PRN
  Administered 2018-12-14: 250 mL via INTRAVENOUS

## 2018-12-14 MED ORDER — CEPHALEXIN 500 MG PO CAPS: 500.0000 mg | ORAL_CAPSULE | Freq: Three times a day (TID) | ORAL | Status: DC

## 2018-12-14 MED ORDER — POLYETHYLENE GLYCOL 3350 17 G PO PACK: 17.0000 g | PACK | Freq: Every day | ORAL | 0 refills | Status: DC

## 2018-12-14 MED ORDER — SACUBITRIL-VALSARTAN 49-51 MG PO TABS: 1.0000 | ORAL_TABLET | Freq: Two times a day (BID) | ORAL | 0 refills | Status: DC

## 2018-12-14 MED ORDER — INSULIN GLARGINE 100 UNIT/ML SOLOSTAR PEN: PEN_INJECTOR | SUBCUTANEOUS | 0 refills | Status: DC

## 2018-12-14 MED ORDER — CEPHALEXIN 500 MG PO CAPS: 500.0000 mg | ORAL_CAPSULE | Freq: Three times a day (TID) | ORAL | 0 refills | Status: AC

## 2018-12-14 MED ORDER — INSULIN LISPRO 100 UNIT/ML ~~LOC~~ SOLN: 10.0000 [IU] | SUBCUTANEOUS | 0 refills | Status: DC

## 2018-12-14 MED ORDER — INSULIN ASPART 100 UNIT/ML ~~LOC~~ SOLN
12.0000 [IU] | Freq: Three times a day (TID) | SUBCUTANEOUS | Status: DC
  Administered 2018-12-14: 12 [IU] via SUBCUTANEOUS

## 2018-12-14 MED ORDER — ENSURE ENLIVE PO LIQD: 237.0000 mL | Freq: Two times a day (BID) | ORAL | 12 refills | Status: AC | PRN

## 2018-12-14 MED ORDER — INSULIN ASPART 100 UNIT/ML ~~LOC~~ SOLN: 12.0000 [IU] | Freq: Three times a day (TID) | SUBCUTANEOUS | Status: DC

## 2018-12-14 MED ORDER — SENNA 8.6 MG PO TABS: 2.0000 | ORAL_TABLET | Freq: Every day | ORAL | 0 refills | Status: DC

## 2018-12-14 NOTE — Discharge Summary (Signed)
Discharge Summary  Marisa Forbes GDJ:[id number] DOB: 13-Oct-1951  PCP: Patient, No Pcp Per  Admit date: 12/10/2018 Discharge date: 12/14/2018  Time spent: 35 minutes  Recommendations for Outpatient Follow-up:  1. Follow-up with general surgery 2. Follow-up with your PCP 3. Follow-up with your cardiologist 4. Take your medications as prescribed  Discharge Diagnoses:  Active Hospital Problems   Diagnosis Date Noted  . Perirectal abscess 12/11/2018  . Peri-rectal abscess 12/12/2018  . Left renal mass 12/11/2018  . Pleural effusion 12/11/2018  . Lower urinary tract infectious disease   . Proctitis   . Unintentional weight loss   . Chronic combined systolic (congestive) and diastolic (congestive) heart failure (Marisa Forbes) 01/22/2018  . DM (diabetes mellitus), type 2 ([place])   . History of completed stroke 09/12/2012    Class: Acute  . CAD (coronary artery disease) 09/09/2012    Resolved Hospital Problems  No resolved problems to display.    Discharge Condition: Stable  Diet recommendation: Resume previous diet heart healthy carb modified diet  Vitals:   12/13/18 2030 12/14/18 0505  BP: (!) 144/81 (!) 152/98  Pulse: 98 97  Resp: 20 18  Temp: 98.6 F (37 C) 98.6 F (37 C)  SpO2: 100% 98%    History of present illness:  Marisa Forbes a 68 y.o.femalewith medical history significant forinsulin-dependent diabetes mellitus, chronic combined systolic and diastolic CHF, history of CVA, history of breast cancer status post lumpectomy followed by chemotherapy and radiation, and coronary artery disease, now presenting to the emergency department for evaluation of rectal pain, decreased appetite, and unintentional weight loss.Patient reports insidious development of loss of appetite and general malaise over the past couple months. She reports losing approximately 20 pounds over this interval. She has also developed worsening rectal pain over the past 2 days and reports a  swelling around her anus.   General surgery consulted for perirectal abscess and followed, the patient underwent operative I&D on 12/11/2018.  Deep tissue culture grew staph aureus, pansensitive.  Did well on Ancef. Afebrile with no leukocytosis.  She had an incidentally found left lower pole kidney hemorrhagic cyst on MRI abdomen with and without contrast done on 12/12/2018.  Hospital course complicated by bilateral pleural effusions status post left thoracentesis yielding 500 cc fluid and right thoracentesis yielded 550 cc fluid.  No complications. Left fluid Pathology: ATYPICAL CELLS PRESENT. Body fluid culture and gram stain no growth and no organism seen.  12/14/2018: Patient seen and examined at bedside.  Her husband is present in the room.  No acute events overnight.  She has no new complaints.  She is tolerating a diet well.  Denies any pain.  On the day of discharge, the patient was hemodynamically stable.  She will need to follow-up with general surgery and her PCP post hospitalization.    Hospital Course:  Principal Problem:   Perirectal abscess Active Problems:   CAD (coronary artery disease)   History of completed stroke   DM (diabetes mellitus), type 2 (HCC)   Chronic combined systolic (congestive) and diastolic (congestive) heart failure (HCC)   Left renal mass   Pleural effusion   Lower urinary tract infectious disease   Proctitis   Unintentional weight loss   Peri-rectal abscess  Perirectal abscess:Presents with rectal pain and swelling; CT findings suggestive of perirectal abscess. The patient has undergone operative incision and drainage. Cultures have been taken and have demonstrated abundant staph aureus pansensitive. She initially received IV Rocephin and flagyl, then switched to ancef. Started on  po Keflex on 12/14/18 for 7 days.  Left lower pole kidney hemorhagic cyst: MRI report read this out as a hemorrhagic cyst.  No further evaluation or management needs  to be done inpatient per urology, Dr Marisa Forbes.  Moderate bilateral pleural effusions: Had left thoracentesis on 12/12/2018 with 500 cc of fluid removed.  Right thoracentesis done on 12/13/2018 with 550 cc of fluid removed.   Cultures were collected and have had no growth as of yet.there is no evidence of cirrhosis.  Gram stain no WBC or organism present.  Pathology showed atypical cells present.  Chronic combined systolic & diastolic CHF: Appears euvolemic.    Continue cardiac medications and follow-up with your cardiologist outpatient.  CAD: Noted and stable.  Denies chest pain.  The patient is being continued on herASA, beta-blocker, nitrates, and statinasat home.  History of JAS:NKNL CT with no acute findings. Pt is receiving ASA and statin.  Insulin-dependent DM2 complicated by [id number] was 14.3% one year ago.  Hemoglobin A1c on 12/11/2018 was 13.8.  Continue home regimen.  At home the patient receivesLantus 18 units qHS and Humalog 10-12 units qAM.  Follow-up with your PCP outpatient.   Code Status:Full  Consults called:General surgery, urology, interventional radiology.   Consultants   Urology, interventional radiology, and general surgery  Procedures   Left and right thoracentesis and I &D of perirectal abscess  Antibiotics   Rocephin and Flagyl     Discharge Exam: BP (!) 152/98 (BP Location: Right Arm) Comment: RN notified  Pulse 97   Temp 98.6 F (37 C) (Oral)   Resp 18   Ht 5\' 5"  (1.651 m)   Wt 56.2 kg   SpO2 98%   BMI 20.62 kg/m  . General: 68 y.o. year-old female well developed well nourished in no acute distress.  Alert and oriented x3. . Cardiovascular: Regular rate and rhythm with no rubs or gallops.  No thyromegaly or JVD noted.   Marland Kitchen Respiratory: Clear to auscultation with no wheezes or rales. Good inspiratory effort. . Abdomen: Soft nontender nondistended with normal bowel sounds x4 quadrants. . Musculoskeletal: No lower  extremity edema. 2/4 pulses in all 4 extremities. Marland Kitchen Psychiatry: Mood is appropriate for condition and setting  Discharge Instructions You were cared for by a hospitalist during your hospital stay. If you have any questions about your discharge medications or the care you received while you were in the hospital after you are discharged, you can call the unit and asked to speak with the hospitalist on call if the hospitalist that took care of you is not available. Once you are discharged, your primary care physician will handle any further medical issues. Please note that NO REFILLS for any discharge medications will be authorized once you are discharged, as it is imperative that you return to your primary care physician (or establish a relationship with a primary care physician if you do not have one) for your aftercare needs so that they can reassess your need for medications and monitor your lab values.   Allergies as of 12/14/2018   No Known Allergies     Medication List    STOP taking these medications   furosemide 40 MG tablet Commonly known as:  LASIX     TAKE these medications   aspirin 81 MG EC tablet Take 1 tablet (81 mg total) by mouth daily.   atorvastatin 80 MG tablet Commonly known as:  LIPITOR Take 1 tablet (80 mg total) by mouth daily.   carvedilol 25 MG  tablet Commonly known as:  COREG TAKE 1 TABLET(25 MG) BY MOUTH TWICE DAILY WITH A MEAL   cephALEXin 500 MG capsule Commonly known as:  KEFLEX Take 1 capsule (500 mg total) by mouth 3 (three) times daily for 7 days.   ezetimibe 10 MG tablet Commonly known as:  ZETIA TAKE 1 TABLET(10 MG) BY MOUTH DAILY   feeding supplement (ENSURE ENLIVE) Liqd Take 237 mLs by mouth 2 (two) times daily as needed (If PO intakes of meals are poor).   glucose blood test strip Commonly known as:  ONETOUCH VERIO Use TID   Insulin Glargine 100 UNIT/ML Solostar Pen Commonly known as:  LANTUS SOLOSTAR Inject 18 units Fowler at bedtime.     insulin lispro 100 UNIT/ML injection Commonly known as:  HUMALOG Inject 0.1-0.12 mLs (10-12 Units total) into the skin every morning.   isosorbide mononitrate 60 MG 24 hr tablet Commonly known as:  IMDUR Take 1 tablet (60 mg total) by mouth daily. Please call and make an appt with Dr. Harrington Challenger for further refills.   nitroGLYCERIN 0.4 MG SL tablet Commonly known as:  NITROSTAT Place 1 tablet (0.4 mg total) under the tongue every 5 (five) minutes as needed for chest pain.   polyethylene glycol packet Commonly known as:  MIRALAX / GLYCOLAX Take 17 g by mouth daily. Start taking on:  December 15, 2018   sacubitril-valsartan 49-51 MG Commonly known as:  ENTRESTO Take 1 tablet by mouth 2 (two) times daily.   senna 8.6 MG Tabs tablet Commonly known as:  SENOKOT Take 2 tablets (17.2 mg total) by mouth at bedtime.   spironolactone 25 MG tablet Commonly known as:  ALDACTONE Take 1 tablet (25 mg total) by mouth daily.      No Known Allergies Follow-up Information    [facility] Follow up on 12/27/2018.   Specialty:  General Surgery Why:  Your appointment is at 11:30 AM.  Be at the office 30 minutes early for check in.  Bring photo ID and insuracne information.   Contact information: [address] [place] [address] [phone]        Fay Records, MD. Call in 1 day(s).   Specialty:  Cardiology Why:  Please call for post hospital follow-up appointment Contact information: [place] [address] [phone]            The results of significant diagnostics from this hospitalization (including imaging, microbiology, ancillary and laboratory) are listed below for reference.    Significant Diagnostic Studies: Dg Chest 1 Forbes  Result Date: 12/12/2018 CLINICAL DATA:  Status post left thoracentesis EXAM: CHEST  1 Forbes COMPARISON:  12/11/2018 chest CT FINDINGS: Top-normal heart size. Normal mediastinal contour. No  pneumothorax. Small bilateral pleural effusions, decreased on the left. Surgical clips overlie the left breast. No pulmonary edema. Stable mild scarring versus atelectasis at the left lung base. IMPRESSION: 1. No pneumothorax. 2. Small left pleural effusion, decreased. Stable small right pleural effusion. 3. Stable mild left basilar scarring versus atelectasis. Electronically Signed   By: Ilona Sorrel M.D.   On: 12/12/2018 10:37   Ct Head Wo Contrast  Result Date: 12/11/2018 CLINICAL DATA:  Possible recent stroke EXAM: CT HEAD WITHOUT CONTRAST TECHNIQUE: Contiguous axial images were obtained from the base of the skull through the vertex without intravenous contrast. COMPARISON:  04/11/2015 FINDINGS: Brain: Encephalomalacia changes are noted in the right occipital lobe consistent with prior infarct. These changes are stable from  prior exam. Scattered chronic white matter ischemic changes are seen. No findings to suggest acute hemorrhage, acute infarction or space-occupying mass lesion noted. Vascular: No hyperdense vessel or unexpected calcification. Skull: Normal. Negative for fracture or focal lesion. Sinuses/Orbits: No acute finding. Other: None. IMPRESSION: Chronic changes without acute abnormality. Electronically Signed   By: Inez Catalina M.D.   On: 12/11/2018 03:15   Ct Chest W Contrast  Result Date: 12/11/2018 CLINICAL DATA:  Rectal pain for 2 days. No bowel movement in 24 hours. 20 lb weight loss. Remote history of left breast cancer. EXAM: CT CHEST, ABDOMEN, AND PELVIS WITH CONTRAST TECHNIQUE: Multidetector CT imaging of the chest, abdomen and pelvis was performed following the standard protocol during bolus administration of intravenous contrast. CONTRAST:  141mL ISOVUE-300 IOPAMIDOL (ISOVUE-300) INJECTION 61% COMPARISON:  CT 07/29/2015 FINDINGS: CT CHEST FINDINGS Cardiovascular: Heart is mildly enlarged. Moderate coronary artery calcifications and scattered aortic calcifications. No evidence of  aneurysm. Mediastinum/Nodes: No mediastinal, hilar, or axillary adenopathy. Lungs/Pleura: Moderate bilateral pleural effusions. Areas of atelectasis in the lower lobes bilaterally. Scarring in the lingula. No acute confluent airspace opacities or suspicious pulmonary nodules. Musculoskeletal: Chest wall soft tissues are unremarkable. No acute bony abnormality. CT ABDOMEN PELVIS FINDINGS Hepatobiliary: No focal hepatic abnormality. Gallbladder unremarkable. Pancreas: No focal abnormality or ductal dilatation. Spleen: No focal abnormality.  Normal size. Adrenals/Urinary Tract: 5.8 x 4.8 x 4.5 cm mixed density mass off the lower pole of the left kidney. This has enlarged slightly when compared to prior study when this measured 5.1 x 4.5 x 4.4 cm. Cannot exclude slowly growing renal neoplasm. No hydronephrosis. Adrenal glands unremarkable. Gas noted within the urinary bladder, presumably from recent catheterization. No bladder wall abnormality. Stomach/Bowel: There appears to be circumferential wall thickening noted in the region of the anus/lower rectum. There also appears to be fluid collection posterior to the rectum on image 124 concerning for perirectal abscess. This measures up to 3.3 cm. This also extends to the right of the rectum/anus. No evidence of bowel obstruction. Moderate stool burden in the colon. Vascular/Lymphatic: Aortic atherosclerosis. No enlarged abdominal or pelvic lymph nodes. Reproductive: Uterus and adnexa unremarkable.  No mass. Other: No free fluid or free air. Musculoskeletal: No acute bony abnormality. IMPRESSION: Circumferential wall thickening in the region of the rectum/anus concerning for proctitis. Fluid collection noted posterior and to the right of the lower rectum and anus concerning for perirectal abscess. Mixed density mass noted off the lower pole of the left kidney measuring up to 5.8 cm, slowly enlarging since 2014 when this measured up to 5.1 cm. Cannot exclude slowly  enlarging renal cell neoplasm. Recommend urologic consultation. Moderate bilateral pleural effusions. Compressive atelectasis in the lower lobes. Cardiomegaly, coronary artery disease. Electronically Signed   By: Rolm Baptise M.D.   On: 12/11/2018 03:39   Mr Abdomen W Wo Contrast  Result Date: 12/12/2018 CLINICAL DATA:  68 year old female with history of left-sided breast cancer. Indeterminate lesion in the lower pole the left kidney noted on prior CT examination. Follow-up study. EXAM: MRI ABDOMEN WITHOUT AND WITH CONTRAST TECHNIQUE: Multiplanar multisequence MR imaging of the abdomen was performed both before and after the administration of intravenous contrast. CONTRAST:  6 mL of Gadavist. COMPARISON:  No prior abdominal MRI. CT the abdomen and pelvis 12/11/2018. FINDINGS: Lower chest: Moderate right and small left pleural effusions lying dependently. Mild cardiomegaly. Hepatobiliary: No suspicious cystic or solid hepatic lesions. No intra or extrahepatic biliary ductal dilatation. Gallbladder is normal in appearance. Pancreas: No pancreatic  mass. No pancreatic ductal dilatation. No pancreatic or peripancreatic fluid or inflammatory changes. Spleen:  Unremarkable. Adrenals/Urinary Tract: The lesion of concern in the lower pole of the left kidney measures 4.5 x 4.2 x 5.4 cm (axial image 74 of series 900 and coronal image 34 of series 10) and is heterogeneous in signal intensity but predominantly T1 hyperintense, predominantly T2 hypointense and demonstrates no definitive internal enhancement on post gadolinium images. Right kidney and bilateral adrenal glands are normal in appearance. No hydroureteronephrosis in the visualized portions of the abdomen. Stomach/Bowel: Visualized portions are unremarkable. Vascular/Lymphatic: No aneurysm identified in the visualized abdominal vasculature. No lymphadenopathy noted in the abdomen. Other: No significant volume of ascites noted in the visualized portions of the  peritoneal cavity. Musculoskeletal: No aggressive appearing osseous lesions are noted in the visualized portions of the skeleton. IMPRESSION: 1. The lesion of concern in the lower pole of the left kidney has imaging characteristics compatible with a hemorrhagic cyst. 2. Moderate right and small left pleural effusions lying dependently. 3. Mild cardiomegaly. Electronically Signed   By: Vinnie Langton M.D.   On: 12/12/2018 12:13   Ct Abdomen Pelvis W Contrast  Result Date: 12/11/2018 CLINICAL DATA:  Rectal pain for 2 days. No bowel movement in 24 hours. 20 lb weight loss. Remote history of left breast cancer. EXAM: CT CHEST, ABDOMEN, AND PELVIS WITH CONTRAST TECHNIQUE: Multidetector CT imaging of the chest, abdomen and pelvis was performed following the standard protocol during bolus administration of intravenous contrast. CONTRAST:  167mL ISOVUE-300 IOPAMIDOL (ISOVUE-300) INJECTION 61% COMPARISON:  CT 07/29/2015 FINDINGS: CT CHEST FINDINGS Cardiovascular: Heart is mildly enlarged. Moderate coronary artery calcifications and scattered aortic calcifications. No evidence of aneurysm. Mediastinum/Nodes: No mediastinal, hilar, or axillary adenopathy. Lungs/Pleura: Moderate bilateral pleural effusions. Areas of atelectasis in the lower lobes bilaterally. Scarring in the lingula. No acute confluent airspace opacities or suspicious pulmonary nodules. Musculoskeletal: Chest wall soft tissues are unremarkable. No acute bony abnormality. CT ABDOMEN PELVIS FINDINGS Hepatobiliary: No focal hepatic abnormality. Gallbladder unremarkable. Pancreas: No focal abnormality or ductal dilatation. Spleen: No focal abnormality.  Normal size. Adrenals/Urinary Tract: 5.8 x 4.8 x 4.5 cm mixed density mass off the lower pole of the left kidney. This has enlarged slightly when compared to prior study when this measured 5.1 x 4.5 x 4.4 cm. Cannot exclude slowly growing renal neoplasm. No hydronephrosis. Adrenal glands unremarkable. Gas  noted within the urinary bladder, presumably from recent catheterization. No bladder wall abnormality. Stomach/Bowel: There appears to be circumferential wall thickening noted in the region of the anus/lower rectum. There also appears to be fluid collection posterior to the rectum on image 124 concerning for perirectal abscess. This measures up to 3.3 cm. This also extends to the right of the rectum/anus. No evidence of bowel obstruction. Moderate stool burden in the colon. Vascular/Lymphatic: Aortic atherosclerosis. No enlarged abdominal or pelvic lymph nodes. Reproductive: Uterus and adnexa unremarkable.  No mass. Other: No free fluid or free air. Musculoskeletal: No acute bony abnormality. IMPRESSION: Circumferential wall thickening in the region of the rectum/anus concerning for proctitis. Fluid collection noted posterior and to the right of the lower rectum and anus concerning for perirectal abscess. Mixed density mass noted off the lower pole of the left kidney measuring up to 5.8 cm, slowly enlarging since 2014 when this measured up to 5.1 cm. Cannot exclude slowly enlarging renal cell neoplasm. Recommend urologic consultation. Moderate bilateral pleural effusions. Compressive atelectasis in the lower lobes. Cardiomegaly, coronary artery disease. Electronically Signed   By:  Rolm Baptise M.D.   On: 12/11/2018 03:39   Dg Chest Port 1 Forbes  Result Date: 12/13/2018 CLINICAL DATA:  68 y/o  F; status post right thoracentesis. EXAM: PORTABLE CHEST 1 Forbes COMPARISON:  None. FINDINGS: Stable cardiac silhouette given projection and technique. Surgical clips project over the left breast. Diminution of right pleural effusion. No pneumothorax. Stable small left pleural effusion. No focal consolidation. Bones are unremarkable. IMPRESSION: Diminution of right pleural effusion. No pneumothorax. Stable small left effusion. Electronically Signed   By: Kristine Garbe M.D.   On: 12/13/2018 13:40   US  Thoracentesis Asp Pleural Space W/img Guide  Result Date: 12/12/2018 INDICATION: Patient with history of breast cancer, dyspnea, and bilateral pleural effusions. Request is made for diagnostic and therapeutic left thoracentesis. EXAM: ULTRASOUND GUIDED DIAGNOSTIC AND THERAPEUTIC LEFT THORACENTESIS MEDICATIONS: 8 mL of 1% lidocaine COMPLICATIONS: None immediate. PROCEDURE: An ultrasound guided thoracentesis was thoroughly discussed with the patient and questions answered. The benefits, risks, alternatives and complications were also discussed. The patient understands and wishes to proceed with the procedure. Written consent was obtained. Ultrasound was performed to localize and mark an adequate pocket of fluid in the left chest. The area was then prepped and draped in the normal sterile fashion. 1% Lidocaine was used for local anesthesia. Under ultrasound guidance a 6 Fr Safe-T-Centesis catheter was introduced. Thoracentesis was performed. The catheter was removed and a dressing applied. FINDINGS: A total of approximately 500 mL of clear gold fluid was removed. Samples were sent to the laboratory as requested by the clinical team. IMPRESSION: Successful ultrasound guided left thoracentesis yielding 500 mL of pleural fluid. Read by: Earley Abide, PA-C Electronically Signed   By: Jerilynn Mages.  Shick M.D.   On: 12/12/2018 10:53    Microbiology: Recent Results (from the past 240 hour(s))  Urine culture     Status: Abnormal   Collection Time: 12/10/18 10:38 PM  Result Value Ref Range Status   Specimen Description   Final    URINE, RANDOM Performed at [facility] [address], [place], [place] [address]    Special Requests   Final    NONE Performed at [facility], [place] [address], [place], [place] [address]    Culture >=100,000 COLONIES/mL ESCHERICHIA COLI (A)  Final   Report Status 12/14/2018 FINAL  Final   Organism ID, Bacteria ESCHERICHIA COLI (A)  Final       Susceptibility   Escherichia coli - MIC*    AMPICILLIN <=2 SENSITIVE Sensitive     CEFAZOLIN <=4 SENSITIVE Sensitive     CEFTRIAXONE <=1 SENSITIVE Sensitive     CIPROFLOXACIN <=0.25 SENSITIVE Sensitive     GENTAMICIN <=1 SENSITIVE Sensitive     IMIPENEM <=0.25 SENSITIVE Sensitive     NITROFURANTOIN <=16 SENSITIVE Sensitive     TRIMETH/SULFA <=20 SENSITIVE Sensitive     AMPICILLIN/SULBACTAM <=2 SENSITIVE Sensitive     PIP/TAZO <=4 SENSITIVE Sensitive     Extended ESBL NEGATIVE Sensitive     * >=100,000 COLONIES/mL ESCHERICHIA COLI  Aerobic/Anaerobic Culture (surgical/deep wound)     Status: None (Preliminary result)   Collection Time: 12/11/18  1:04 PM  Result Value Ref Range Status   Specimen Description   Final    ABSCESS PERIRECTAL Performed at [facility] [address], [place], [place] [address]    Special Requests   Final    NONE Performed at [facility], [place] [address] [place], [place] [address]  Gram Stain   Final    ABUNDANT WBC PRESENT,BOTH PMN AND MONONUCLEAR ABUNDANT GRAM POSITIVE COCCI    Culture   Final    ABUNDANT STAPHYLOCOCCUS AUREUS CULTURE REINCUBATED FOR BETTER GROWTH Performed at [facility], [place] [address], [place], [place] [address]    Report Status PENDING  Incomplete   Organism ID, Bacteria STAPHYLOCOCCUS AUREUS  Final      Susceptibility   Staphylococcus aureus - MIC*    CIPROFLOXACIN <=0.5 SENSITIVE Sensitive     ERYTHROMYCIN <=0.25 SENSITIVE Sensitive     GENTAMICIN <=0.5 SENSITIVE Sensitive     OXACILLIN 0.5 SENSITIVE Sensitive     TETRACYCLINE <=1 SENSITIVE Sensitive     VANCOMYCIN <=0.5 SENSITIVE Sensitive     TRIMETH/SULFA <=10 SENSITIVE Sensitive     CLINDAMYCIN <=0.25 SENSITIVE Sensitive     RIFAMPIN <=0.5 SENSITIVE Sensitive     Inducible Clindamycin NEGATIVE Sensitive     * ABUNDANT STAPHYLOCOCCUS AUREUS  Culture, body fluid-bottle     Status: None (Preliminary result)   Collection  Time: 12/12/18  9:55 AM  Result Value Ref Range Status   Specimen Description PLEURAL LEFT  Final   Special Requests NONE  Final   Culture   Final    NO GROWTH 2 DAYS Performed at [facility], [place] [address], [place], [place] [address]    Report Status PENDING  Incomplete  Gram stain     Status: None   Collection Time: 12/12/18  9:55 AM  Result Value Ref Range Status   Specimen Description PLEURAL LEFT  Final   Special Requests NONE  Final   Gram Stain   Final    NO WBC SEEN NO ORGANISMS SEEN Performed at [facility], [place] [address] [place], [place] [address]    Report Status 12/12/2018 FINAL  Final     Labs: Basic Metabolic Panel: Recent Labs  Lab 12/10/18 1851 12/11/18 0605 12/12/18 0600 12/13/18 0554  NA 133* 129* 132* 135  K 3.7 3.4* 3.3* 3.5  CL 96* 93* 103 104  CO2 22 20* 22 21*  GLUCOSE 311* 335* 255* 178*  BUN 14 15 19 17   CREATININE 0.67 0.53 0.58 0.47  CALCIUM 9.3 8.5* 8.5* 8.7*   Liver Function Tests: Recent Labs  Lab 12/11/18 0605  AST 15  ALT 16  ALKPHOS 93  BILITOT 1.4*  PROT 6.3*  ALBUMIN 3.3*   No results for input(s): LIPASE, AMYLASE in the last 168 hours. No results for input(s): AMMONIA in the last 168 hours. CBC: Recent Labs  Lab 12/10/18 1851 12/11/18 0605 12/12/18 0600 12/13/18 0554  WBC 11.8* 9.1 10.1 8.3  NEUTROABS  --  7.7  --   --   HGB 14.9 13.7 13.0 13.9  HCT 45.6 42.7 40.2 44.0  MCV 88.2 86.6 88.9 89.1  PLT 203 181 183 232   Cardiac Enzymes: No results for input(s): CKTOTAL, CKMB, CKMBINDEX, TROPONINI in the last 168 hours. BNP: BNP (last 3 results) No results for input(s): BNP in the last 8760 hours.  ProBNP (last 3 results) No results for input(s): PROBNP in the last 8760 hours.  CBG: Recent Labs  Lab 12/13/18 1711 12/13/18 2026 12/14/18 0002 12/14/18 0321 12/14/18 0757  GLUCAP 224* 280* 331* 312* 232*       Signed:  Kayleen Memos, MD [facility] 12/14/2018, 11:03 AM

## 2018-12-14 NOTE — Progress Notes (Signed)
Discharge instructions reviewed with patient using teach back method no questions at this time. Patient discharged to home  

## 2018-12-14 NOTE — Progress Notes (Signed)
[facility] Progress Note  3 Days Post-Op  Subjective: CC: perirectal abscess Reports pain continues to improve. Hopeful she can go home today  Objective: Vital signs in last 24 hours: Temp:  [97.8 F (36.6 C)-98.6 F (37 C)] 98.6 F (37 C) (02/15 0505) Pulse Rate:  [83-98] 97 (02/15 0505) Resp:  [16-20] 18 (02/15 0505) BP: (130-152)/(81-98) 152/98 (02/15 0505) SpO2:  [98 %-100 %] 98 % (02/15 0505) Weight:  [56.2 kg] 56.2 kg (02/15 0505) Last BM Date: 12/09/18  Intake/Output from previous day: No intake/output data recorded. Intake/Output this shift: No intake/output data recorded.  PE: Gen:  Alert, NAD, pleasant Card:  Regular rate and rhythm Pulm:  Normal effort   Lab Results:  Recent Labs    12/12/18 0600 12/13/18 0554  WBC 10.1 8.3  HGB 13.0 13.9  HCT 40.2 44.0  PLT 183 232   BMET Recent Labs    12/12/18 0600 12/13/18 0554  NA 132* 135  K 3.3* 3.5  CL 103 104  CO2 22 21*  GLUCOSE 255* 178*  BUN 19 17  CREATININE 0.58 0.47  CALCIUM 8.5* 8.7*   PT/INR No results for input(s): LABPROT, INR in the last 72 hours. CMP     Component Value Date/Time   NA 135 12/13/2018 0554   NA 136 04/26/2018 1429   NA 141 08/14/2014 1024   K 3.5 12/13/2018 0554   K 4.3 08/14/2014 1024   CL 104 12/13/2018 0554   CL 110 (H) 04/10/2013 1020   CO2 21 (L) 12/13/2018 0554   CO2 27 08/14/2014 1024   GLUCOSE 178 (H) 12/13/2018 0554   GLUCOSE 207 (H) 08/14/2014 1024   GLUCOSE 109 (H) 04/10/2013 1020   BUN 17 12/13/2018 0554   BUN 13 04/26/2018 1429   BUN 9.2 08/14/2014 1024   CREATININE 0.47 12/13/2018 0554   CREATININE 0.81 04/07/2016 1259   CREATININE 0.7 08/14/2014 1024   CALCIUM 8.7 (L) 12/13/2018 0554   CALCIUM 10.1 08/14/2014 1024   PROT 6.3 (L) 12/11/2018 0605   PROT 6.4 04/26/2018 1429   PROT 6.2 (L) 08/14/2014 1024   ALBUMIN 3.3 (L) 12/11/2018 0605   ALBUMIN 4.1 04/26/2018 1429   ALBUMIN 3.5 08/14/2014 1024   AST 15 12/11/2018 0605    AST 10 08/14/2014 1024   ALT 16 12/11/2018 0605   ALT 14 08/14/2014 1024   ALKPHOS 93 12/11/2018 0605   ALKPHOS 80 08/14/2014 1024   BILITOT 1.4 (H) 12/11/2018 0605   BILITOT 0.6 04/26/2018 1429   BILITOT 0.54 08/14/2014 1024   GFRNONAA >60 12/13/2018 0554   GFRAA >60 12/13/2018 0554   Lipase  No results found for: LIPASE     Studies/Results: Dg Chest 1 View  Result Date: 12/12/2018 CLINICAL DATA:  Status post left thoracentesis EXAM: CHEST  1 VIEW COMPARISON:  12/11/2018 chest CT FINDINGS: Top-normal heart size. Normal mediastinal contour. No pneumothorax. Small bilateral pleural effusions, decreased on the left. Surgical clips overlie the left breast. No pulmonary edema. Stable mild scarring versus atelectasis at the left lung base. IMPRESSION: 1. No pneumothorax. 2. Small left pleural effusion, decreased. Stable small right pleural effusion. 3. Stable mild left basilar scarring versus atelectasis. Electronically Signed   By: Ilona Sorrel M.D.   On: 12/12/2018 10:37   Mr Abdomen W Wo Contrast  Result Date: 12/12/2018 CLINICAL DATA:  68 year old female with history of left-sided breast cancer. Indeterminate lesion in the lower pole the left kidney noted on prior CT examination. Follow-up study. EXAM:  MRI ABDOMEN WITHOUT AND WITH CONTRAST TECHNIQUE: Multiplanar multisequence MR imaging of the abdomen was performed both before and after the administration of intravenous contrast. CONTRAST:  6 mL of Gadavist. COMPARISON:  No prior abdominal MRI. CT the abdomen and pelvis 12/11/2018. FINDINGS: Lower chest: Moderate right and small left pleural effusions lying dependently. Mild cardiomegaly. Hepatobiliary: No suspicious cystic or solid hepatic lesions. No intra or extrahepatic biliary ductal dilatation. Gallbladder is normal in appearance. Pancreas: No pancreatic mass. No pancreatic ductal dilatation. No pancreatic or peripancreatic fluid or inflammatory changes. Spleen:  Unremarkable.  Adrenals/Urinary Tract: The lesion of concern in the lower pole of the left kidney measures 4.5 x 4.2 x 5.4 cm (axial image 74 of series 900 and coronal image 34 of series 10) and is heterogeneous in signal intensity but predominantly T1 hyperintense, predominantly T2 hypointense and demonstrates no definitive internal enhancement on post gadolinium images. Right kidney and bilateral adrenal glands are normal in appearance. No hydroureteronephrosis in the visualized portions of the abdomen. Stomach/Bowel: Visualized portions are unremarkable. Vascular/Lymphatic: No aneurysm identified in the visualized abdominal vasculature. No lymphadenopathy noted in the abdomen. Other: No significant volume of ascites noted in the visualized portions of the peritoneal cavity. Musculoskeletal: No aggressive appearing osseous lesions are noted in the visualized portions of the skeleton. IMPRESSION: 1. The lesion of concern in the lower pole of the left kidney has imaging characteristics compatible with a hemorrhagic cyst. 2. Moderate right and small left pleural effusions lying dependently. 3. Mild cardiomegaly. Electronically Signed   By: Vinnie Langton M.D.   On: 12/12/2018 12:13   Dg Chest Port 1 View  Result Date: 12/13/2018 CLINICAL DATA:  68 y/o  F; status post right thoracentesis. EXAM: PORTABLE CHEST 1 VIEW COMPARISON:  None. FINDINGS: Stable cardiac silhouette given projection and technique. Surgical clips project over the left breast. Diminution of right pleural effusion. No pneumothorax. Stable small left pleural effusion. No focal consolidation. Bones are unremarkable. IMPRESSION: Diminution of right pleural effusion. No pneumothorax. Stable small left effusion. Electronically Signed   By: Kristine Garbe M.D.   On: 12/13/2018 13:40   US Thoracentesis Asp Pleural Space W/img Guide  Result Date: 12/12/2018 INDICATION: Patient with history of breast cancer, dyspnea, and bilateral pleural effusions.  Request is made for diagnostic and therapeutic left thoracentesis. EXAM: ULTRASOUND GUIDED DIAGNOSTIC AND THERAPEUTIC LEFT THORACENTESIS MEDICATIONS: 8 mL of 1% lidocaine COMPLICATIONS: None immediate. PROCEDURE: An ultrasound guided thoracentesis was thoroughly discussed with the patient and questions answered. The benefits, risks, alternatives and complications were also discussed. The patient understands and wishes to proceed with the procedure. Written consent was obtained. Ultrasound was performed to localize and mark an adequate pocket of fluid in the left chest. The area was then prepped and draped in the normal sterile fashion. 1% Lidocaine was used for local anesthesia. Under ultrasound guidance a 6 Fr Safe-T-Centesis catheter was introduced. Thoracentesis was performed. The catheter was removed and a dressing applied. FINDINGS: A total of approximately 500 mL of clear gold fluid was removed. Samples were sent to the laboratory as requested by the clinical team. IMPRESSION: Successful ultrasound guided left thoracentesis yielding 500 mL of pleural fluid. Read by: Earley Abide, PA-C Electronically Signed   By: Jerilynn Mages.  Shick M.D.   On: 12/12/2018 10:53    Anti-infectives: Anti-infectives (From admission, onward)   Start     Dose/Rate Route Frequency Ordered Stop   12/14/18 0600  ceFAZolin (ANCEF) IVPB 2g/100 mL premix     2 g 200  mL/hr over 30 Minutes Intravenous Every 8 hours 12/13/18 1533     12/11/18 1400  metroNIDAZOLE (FLAGYL) IVPB 500 mg  Status:  Discontinued     500 mg 100 mL/hr over 60 Minutes Intravenous Every 8 hours 12/11/18 0433 12/13/18 1533   12/11/18 1000  cefTRIAXone (ROCEPHIN) 2 g in sodium chloride 0.9 % 100 mL IVPB  Status:  Discontinued     2 g 200 mL/hr over 30 Minutes Intravenous Every 24 hours 12/11/18 0433 12/13/18 1533   12/11/18 0415  metroNIDAZOLE (FLAGYL) IVPB 500 mg     500 mg 100 mL/hr over 60 Minutes Intravenous  Once 12/11/18 0400 12/11/18 0547   12/11/18 0100   cefTRIAXone (ROCEPHIN) 1 g in sodium chloride 0.9 % 100 mL IVPB     1 g 200 mL/hr over 30 Minutes Intravenous  Once 12/11/18 0051 12/11/18 0216       Assessment/Plan 5.8 cm left renal mass -Dr. 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

## 2018-12-16 LAB — AEROBIC/ANAEROBIC CULTURE W GRAM STAIN (SURGICAL/DEEP WOUND)

## 2018-12-17 ENCOUNTER — Encounter: Payer: Self-pay | Admitting: *Deleted

## 2018-12-17 DIAGNOSIS — J910 Malignant pleural effusion: Secondary | ICD-10-CM

## 2018-12-17 LAB — CULTURE, BODY FLUID W GRAM STAIN -BOTTLE: Culture: NO GROWTH

## 2018-12-17 NOTE — Progress Notes (Signed)
Oncology Nurse Navigator Documentation  Oncology Nurse Navigator Flowsheets 12/17/2018  Navigator Location [facility]  Referral date to RadOnc/MedOnc 12/17/2018  Navigator Encounter Type Other/I received a message from Dr. Hassell Done to have patient to be seen with general oncology.  Order completed.   Treatment Phase Pre-Tx/Tx Discussion  Barriers/Navigation Needs Coordination of Care  Interventions Coordination of Care  Coordination of Care Other  Acuity Level 1  Time Spent with Patient 15

## 2018-12-17 NOTE — Anesthesia Postprocedure Evaluation (Signed)
Anesthesia Post Note  Patient: Marisa Forbes  Procedure(s) Performed: IRRIGATION AND DEBRIDEMENT PERIRECTAL ABSCESS (N/A )     Patient location during evaluation: PACU Anesthesia Type: General Level of consciousness: awake and alert Pain management: pain level controlled Vital Signs Assessment: post-procedure vital signs reviewed and stable Respiratory status: spontaneous breathing, nonlabored ventilation, respiratory function stable and patient connected to nasal cannula oxygen Cardiovascular status: blood pressure returned to baseline and stable Postop Assessment: no apparent nausea or vomiting Anesthetic complications: no    Last Vitals:  Vitals:   12/14/18 1134 12/14/18 1136  BP: (!) 149/104 (!) 144/89  Pulse: 93 93  Resp: 18   Temp: 37.1 C   SpO2: 100% 100%    Last Pain:  Vitals:   12/14/18 1136  TempSrc:   PainSc: 0-No pain                 Dashiel Bergquist DAVID

## 2018-12-24 ENCOUNTER — Telehealth: Payer: Self-pay | Admitting: *Deleted

## 2018-12-24 NOTE — Telephone Encounter (Signed)
Oncology Nurse Navigator Documentation  Oncology Nurse Navigator Flowsheets 12/24/2018  Navigator Location [facility]  Navigator Encounter Type Telephone/I received an update today from thoracic cancer conference 12/19/2018.  Pathology states no malignant cytology.  I updated Dr. Hassell Done.  He requested cancel oncology referral.  Will cancel.   Telephone Outgoing Call  Barriers/Navigation Needs Education;Coordination of Care  Education Other  Interventions Coordination of Care;Education  Coordination of Care Other  Education Method Verbal  Acuity Level 2  Time Spent with Patient 15

## 2018-12-25 ENCOUNTER — Encounter: Payer: Self-pay | Admitting: Internal Medicine

## 2018-12-25 ENCOUNTER — Ambulatory Visit (INDEPENDENT_AMBULATORY_CARE_PROVIDER_SITE_OTHER): Payer: Medicare Other | Admitting: Internal Medicine

## 2018-12-25 ENCOUNTER — Telehealth: Payer: Self-pay | Admitting: *Deleted

## 2018-12-25 VITALS — BP 122/64 | HR 79 | Temp 97.6°F | Ht 65.0 in | Wt 121.2 lb

## 2018-12-25 DIAGNOSIS — I5042 Chronic combined systolic (congestive) and diastolic (congestive) heart failure: Secondary | ICD-10-CM | POA: Diagnosis not present

## 2018-12-25 DIAGNOSIS — K611 Rectal abscess: Secondary | ICD-10-CM

## 2018-12-25 DIAGNOSIS — Z853 Personal history of malignant neoplasm of breast: Secondary | ICD-10-CM

## 2018-12-25 DIAGNOSIS — N3000 Acute cystitis without hematuria: Secondary | ICD-10-CM

## 2018-12-25 DIAGNOSIS — Z23 Encounter for immunization: Secondary | ICD-10-CM

## 2018-12-25 DIAGNOSIS — Z794 Long term (current) use of insulin: Secondary | ICD-10-CM

## 2018-12-25 DIAGNOSIS — N390 Urinary tract infection, site not specified: Secondary | ICD-10-CM

## 2018-12-25 DIAGNOSIS — J90 Pleural effusion, not elsewhere classified: Secondary | ICD-10-CM | POA: Diagnosis not present

## 2018-12-25 DIAGNOSIS — N2889 Other specified disorders of kidney and ureter: Secondary | ICD-10-CM

## 2018-12-25 DIAGNOSIS — E2839 Other primary ovarian failure: Secondary | ICD-10-CM | POA: Diagnosis not present

## 2018-12-25 DIAGNOSIS — G243 Spasmodic torticollis: Secondary | ICD-10-CM

## 2018-12-25 DIAGNOSIS — I10 Essential (primary) hypertension: Secondary | ICD-10-CM

## 2018-12-25 DIAGNOSIS — E1165 Type 2 diabetes mellitus with hyperglycemia: Secondary | ICD-10-CM | POA: Diagnosis not present

## 2018-12-25 DIAGNOSIS — E785 Hyperlipidemia, unspecified: Secondary | ICD-10-CM

## 2018-12-25 DIAGNOSIS — K6289 Other specified diseases of anus and rectum: Secondary | ICD-10-CM

## 2018-12-25 DIAGNOSIS — I2510 Atherosclerotic heart disease of native coronary artery without angina pectoris: Secondary | ICD-10-CM | POA: Diagnosis not present

## 2018-12-25 DIAGNOSIS — I110 Hypertensive heart disease with heart failure: Secondary | ICD-10-CM

## 2018-12-25 DIAGNOSIS — I519 Heart disease, unspecified: Secondary | ICD-10-CM

## 2018-12-25 DIAGNOSIS — G4700 Insomnia, unspecified: Secondary | ICD-10-CM | POA: Insufficient documentation

## 2018-12-25 MED ORDER — HYDRALAZINE HCL 10 MG PO TABS: 10.0000 mg | ORAL_TABLET | Freq: Two times a day (BID) | ORAL | 2 refills | Status: DC | PRN

## 2018-12-25 NOTE — Patient Instructions (Signed)
Diabetes Mellitus and Nutrition, Adult  When you have diabetes (diabetes mellitus), it is very important to have healthy eating habits because your blood sugar (glucose) levels are greatly affected by what you eat and drink. Eating healthy foods in the appropriate amounts, at about the same times every day, can help you:  · Control your blood glucose.  · Lower your risk of heart disease.  · Improve your blood pressure.  · Reach or maintain a healthy weight.  Every person with diabetes is different, and each person has different needs for a meal plan. Your health care provider may recommend that you work with a diet and nutrition specialist (dietitian) to make a meal plan that is best for you. Your meal plan may vary depending on factors such as:  · The calories you need.  · The medicines you take.  · Your weight.  · Your blood glucose, blood pressure, and cholesterol levels.  · Your activity level.  · Other health conditions you have, such as heart or kidney disease.  How do carbohydrates affect me?  Carbohydrates, also called carbs, affect your blood glucose level more than any other type of food. Eating carbs naturally raises the amount of glucose in your blood. Carb counting is a method for keeping track of how many carbs you eat. Counting carbs is important to keep your blood glucose at a healthy level, especially if you use insulin or take certain oral diabetes medicines.  It is important to know how many carbs you can safely have in each meal. This is different for every person. Your dietitian can help you calculate how many carbs you should have at each meal and for each snack.  Foods that contain carbs include:  · Bread, cereal, rice, pasta, and crackers.  · Potatoes and corn.  · Peas, beans, and lentils.  · Milk and yogurt.  · Fruit and juice.  · Desserts, such as cakes, cookies, ice cream, and candy.  How does alcohol affect me?  Alcohol can cause a sudden decrease in blood glucose (hypoglycemia),  especially if you use insulin or take certain oral diabetes medicines. Hypoglycemia can be a life-threatening condition. Symptoms of hypoglycemia (sleepiness, dizziness, and confusion) are similar to symptoms of having too much alcohol.  If your health care provider says that alcohol is safe for you, follow these guidelines:  · Limit alcohol intake to no more than 1 drink per day for nonpregnant women and 2 drinks per day for men. One drink equals 12 oz of beer, 5 oz of wine, or 1½ oz of hard liquor.  · Do not drink on an empty stomach.  · Keep yourself hydrated with water, diet soda, or unsweetened iced tea.  · Keep in mind that regular soda, juice, and other mixers may contain a lot of sugar and must be counted as carbs.  What are tips for following this plan?    Reading food labels  · Start by checking the serving size on the "Nutrition Facts" label of packaged foods and drinks. The amount of calories, carbs, fats, and other nutrients listed on the label is based on one serving of the item. Many items contain more than one serving per package.  · Check the total grams (g) of carbs in one serving. You can calculate the number of servings of carbs in one serving by dividing the total carbs by 15. For example, if a food has 30 g of total carbs, it would be equal to 2   servings of carbs.  · Check the number of grams (g) of saturated and trans fats in one serving. Choose foods that have low or no amount of these fats.  · Check the number of milligrams (mg) of salt (sodium) in one serving. Most people should limit total sodium intake to less than 2,300 mg per day.  · Always check the nutrition information of foods labeled as "low-fat" or "nonfat". These foods may be higher in added sugar or refined carbs and should be avoided.  · Talk to your dietitian to identify your daily goals for nutrients listed on the label.  Shopping  · Avoid buying canned, premade, or processed foods. These foods tend to be high in fat, sodium,  and added sugar.  · Shop around the outside edge of the grocery store. This includes fresh fruits and vegetables, bulk grains, fresh meats, and fresh dairy.  Cooking  · Use low-heat cooking methods, such as baking, instead of high-heat cooking methods like deep frying.  · Cook using healthy oils, such as olive, canola, or sunflower oil.  · Avoid cooking with butter, cream, or high-fat meats.  Meal planning  · Eat meals and snacks regularly, preferably at the same times every day. Avoid going long periods of time without eating.  · Eat foods high in fiber, such as fresh fruits, vegetables, beans, and whole grains. Talk to your dietitian about how many servings of carbs you can eat at each meal.  · Eat 4-6 ounces (oz) of lean protein each day, such as lean meat, chicken, fish, eggs, or tofu. One oz of lean protein is equal to:  ? 1 oz of meat, chicken, or fish.  ? 1 egg.  ? ¼ cup of tofu.  · Eat some foods each day that contain healthy fats, such as avocado, nuts, seeds, and fish.  Lifestyle  · Check your blood glucose regularly.  · Exercise regularly as told by your health care provider. This may include:  ? 150 minutes of moderate-intensity or vigorous-intensity exercise each week. This could be brisk walking, biking, or water aerobics.  ? Stretching and doing strength exercises, such as yoga or weightlifting, at least 2 times a week.  · Take medicines as told by your health care provider.  · Do not use any products that contain nicotine or tobacco, such as cigarettes and e-cigarettes. If you need help quitting, ask your health care provider.  · Work with a counselor or diabetes educator to identify strategies to manage stress and any emotional and social challenges.  Questions to ask a health care provider  · Do I need to meet with a diabetes educator?  · Do I need to meet with a dietitian?  · What number can I call if I have questions?  · When are the best times to check my blood glucose?  Where to find more  information:  · American Diabetes Association: diabetes.org  · Academy of Nutrition and Dietetics: www.eatright.org  · National Institute of Diabetes and Digestive and Kidney Diseases (NIH): www.niddk.nih.gov  Summary  · A healthy meal plan will help you control your blood glucose and maintain a healthy lifestyle.  · Working with a diet and nutrition specialist (dietitian) can help you make a meal plan that is best for you.  · Keep in mind that carbohydrates (carbs) and alcohol have immediate effects on your blood glucose levels. It is important to count carbs and to use alcohol carefully.  This information is not intended to   replace advice given to you by your health care provider. Make sure you discuss any questions you have with your health care provider.  Document Released: 07/13/2005 Document Revised: 05/16/2017 Document Reviewed: 11/20/2016  [employer]vier Interactive Patient Education © 2019 [employer]

## 2018-12-25 NOTE — Progress Notes (Signed)
Chief Complaint  Patient presents with  . Transitions Of Care   TOC from Dr. Luciano Cutter s/p [place] in hospital 2/11-2/15/2020 for perirectal abscess/UTI with b/l pleural effusions s/p thoracentesis   1. Perirectal abscess/cellulitis +Staph aureus  With CT 12/11/18 c/w proctitis  E coli UTI on 2/12 and 12/10/18  -almost done with course of keflex 500 tid sent home with 7 day supply and given iv Abx in hospital   2. DM 2 uncontrolled 13.8 12/11/18 on lantus 15-18 qhs and Humalog 12 qd to bid cost is $400 per pt and cant afford this   3. C/o SOB with echo 45-50% EF 12/19/17)  H/o Combined systolic and diastolic, h/o STEMI/CAD f/u with Dr. Dorris Carnes on Coreg 25 mg bid, zetia 10, lipitor 80, imdur 60, entresto 49-51, spironolactone 25 mg qd   4. HTN at times elevated today 122/62 in hospital 140s-150s/80s-90s  HLD (with h/o CAD, intraab and intracranial atherosclerosis)   5. Head tremor, benign per pt in the past Botox was offered but c/w cost and would have to continue   6. Insomnia sleeping 2-3 hrs qhs   7. Left kidney mass enlarging MRI + hemorrhagic cyst urology inpatient consult no further w/u   8. Moderate b/l pleural effusions s/p throacentesis 12/13/18 inpatient with atypical cells w/o definite malignancy    Review of Systems  Constitutional: Positive for weight loss.  HENT: Negative for hearing loss.   Eyes:       Reduced vision   Respiratory: Positive for cough.   Cardiovascular: Positive for leg swelling. Negative for chest pain.  Gastrointestinal: Negative for abdominal pain.  Genitourinary: Negative for dysuria.  Musculoskeletal: Negative for falls.  Skin: Negative for rash.  Neurological: Negative for headaches.  Psychiatric/Behavioral: Negative for depression. The patient has insomnia.    Past Medical History:  Diagnosis Date  . Arthritis    back  . Balance problems    due to stroke  . Benign head tremor    per pt due to muscle spasm in neck   . Breast cancer ([place])  12/03/12   a. 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 S/p chemo with CMF and radiation.  . Cataracts, bilateral   . CHF (congestive heart failure) ([place])    On Monday she said her cardiologist was concerned for possible CHF, but is now improving-per pt.(07/26/15)  . CHF (congestive heart failure) ([place])   . Coronary artery disease    a. STEMI 08/2012: s/p DES to LAD, PTCA to downstream LAD. b. Relook cath same hospitalization: stable post-PCI anatomy with Stable Diag lesions (unlikely cause of resting angina, not good PCI targets), stable RCA lesion (non flow limiting).  . Diabetes mellitus without complication ([place])    Takes insulin and metformin  . Dysplasia of cervix, low grade (CIN 1)    in 02 s/p LEEP   . GERD (gastroesophageal reflux disease)    food related  . History of cancer chemotherapy    last in August 2014  . 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 History of radiation therapy 06/23/2013-08/08/2013   62.4 gray to left breast  . HTN (hypertension)   . Hypercholesteremia   . Insomnia   . Left kidney mass   . LV dysfunction    a. EF 45-50% at time of STEMI 08/2012. b. Echo 04/2013: EF  45-50%, mild focal basal hypertrophy of septum, grade 1 d/d.  Marland Kitchen Myocardial infarction ([place]) 09/07/12  . Peripheral vision loss    left  . Perirectal abscess    11/2018   . Pleural effusion, bilateral   . Pneumonia   . Pneumonia   . Renal artery stenosis ([facility])    a. 20% L RAS in 08/2012 by cath. b. Duplex 07/2012: >60% L RAS.  . Stroke, embolic ([place])    a. 16/1096 post cath. with residual reduced vision in left eye; noted right occiptal lobe/PCA infarct   . UTI (urinary tract infection)   . UTI (urinary tract infection)   . Vasovagal syncope    a. 04/2013 - echo stable compared to prior EF  45-50%, negative carotid duplex.   Past Surgical History:  Procedure Laterality Date  . BACK SURGERY     x3, lower back  . BREAST BIOPSY Left 12/03/2012   U/S Core- Malignant  . BREAST LUMPECTOMY Left 01/08/2013  . BREAST LUMPECTOMY WITH NEEDLE LOCALIZATION AND AXILLARY SENTINEL LYMPH NODE BX Left 01/08/2013   Procedure: LEFT BREAST WIRE GUIDED  LUMPECTOMY AND LEFT AXILLARY SENTINEL  NODE BX;  Surgeon: Rolm Bookbinder, MD;  Location: [facility];  Service: General;  Laterality: Left;  . CATARACT EXTRACTION Bilateral   . CERVICAL BIOPSY  W/ LOOP ELECTRODE EXCISION     02/2001  . CORONARY ANGIOPLASTY WITH STENT PLACEMENT    . EYE SURGERY     b/l cataract   . INCISION AND DRAINAGE PERIRECTAL ABSCESS N/A 12/11/2018   Procedure: IRRIGATION AND DEBRIDEMENT PERIRECTAL ABSCESS;  Surgeon: Johnathan Hausen, MD;  Location: [facility];  Service: General;  Laterality: N/A;  . LEFT HEART CATHETERIZATION WITH CORONARY ANGIOGRAM N/A 09/07/2012   Procedure: LEFT HEART CATHETERIZATION WITH CORONARY ANGIOGRAM;  Surgeon: Troy Sine, MD;  Location: [facility] CATH LAB;  Service: Cardiovascular;  Laterality: N/A;  . LEFT HEART CATHETERIZATION WITH CORONARY ANGIOGRAM  09/09/2012   Procedure: LEFT HEART CATHETERIZATION WITH CORONARY ANGIOGRAM;  Surgeon: Leonie Man, MD;  Location: [facility] C[facility] LAB;  Service: Cardiovascular;;  . PERCUTANEOUS CORONARY STENT INTERVENTION (PCI-S) N/A 09/07/2012   Procedure: PERCUTANEOUS CORONARY STENT INTERVENTION (PCI-S);  Surgeon: Troy Sine, MD;  Location: [facility] CATH LAB;  Service: Cardiovascular;  Laterality: N/A;  . PORT-A-CATH REMOVAL N/A 10/14/2013   Procedure: REMOVAL PORT-A-CATH;  Surgeon: Rolm Bookbinder, MD;  [facility];  Service: General;  Laterality: N/A;  . PORTACATH PLACEMENT Right 02/17/2013   Procedure: INSERTION PORT-A-CATH;  Surgeon: Rolm Bookbinder, MD;  Location: [facility];  Service: General;  Laterality: Right;  . THORACENTESIS     12/13/2018 atypical cells not definitely  diag. of malignancy    Family History  Problem Relation Age of Onset  . COPD Mother   . Heart disease Father   . Hypertension Father   . Diabetes Brother   . Hypertension Brother   . Learning disabilities Brother   . Hypertension Brother   . Asthma Daughter   . Cancer Neg Hx   . Hyperlipidemia Neg Hx   . Kidney disease Neg Hx   . Stroke Neg Hx    Social History   Socioeconomic History  . Marital status: Married    Spouse name: [name]  . Number of children: 2  . Years of education: college  . Highest education level: Not on file  Occupational History    Employer: [employer]  . Financial resource strain: Not on file  . Food insecurity:  Worry: Not on file    Inability: Not on file  . Transportation needs:    Medical: Not on file    Non-medical: Not on file  Tobacco Use  . Smoking status: Never Smoker  . Smokeless tobacco: Never Used  Substance and Sexual Activity  . Alcohol use: Not Currently    Comment: socially  . Drug use: No  . Sexual activity: Yes  Lifestyle  . Physical activity:    Days per week: Not on file    Minutes per session: Not on file  . Stress: Not on file  Relationships  . Social connections:    Talks on phone: Not on file    Gets together: Not on file    Attends religious service: Not on file    Active member of club or organization: Not on file    Attends meetings of clubs or organizations: Not on file    Relationship status: Not on file  . Intimate partner violence:    Fear of current or ex partner: Not on file    Emotionally abused: Not on file    Physically abused: Not on file    Forced sexual activity: Not on file  Other Topics Concern  . Not on file  Social History Narrative   Married   Retired    Former Optometrist    Patient has 2 children and some college education.    Owns guns, wears seat belt, safe in relationship    Current Meds  Medication Sig  . aspirin 81 MG EC tablet Take 1 tablet (81  mg total) by mouth daily.  Marland Kitchen atorvastatin (LIPITOR) 80 MG tablet Take 1 tablet (80 mg total) by mouth daily.  . carvedilol (COREG) 25 MG tablet TAKE 1 TABLET(25 MG) BY MOUTH TWICE DAILY WITH A MEAL  . ezetimibe (ZETIA) 10 MG tablet TAKE 1 TABLET(10 MG) BY MOUTH DAILY  . feeding supplement, ENSURE ENLIVE, (ENSURE ENLIVE) LIQD Take 237 mLs by mouth 2 (two) times daily as needed (If PO intakes of meals are poor).  . furosemide (LASIX) 40 MG tablet Take 40 mg by mouth daily.  Marland Kitchen glucose blood (ONETOUCH VERIO) test strip Use TID  . Insulin Glargine (LANTUS SOLOSTAR) 100 UNIT/ML Solostar Pen Inject 18 units [place] at bedtime.  . insulin lispro (HUMALOG) 100 UNIT/ML injection Inject 0.1-0.12 mLs (10-12 Units total) into the skin every morning.  . isosorbide mononitrate (IMDUR) 60 MG 24 hr tablet Take 1 tablet (60 mg total) by mouth daily. Please call and make an appt with Dr. Harrington Challenger for further refills.  . nitroGLYCERIN (NITROSTAT) 0.4 MG SL tablet Place 1 tablet (0.4 mg total) under the tongue every 5 (five) minutes as needed for chest pain.  . polyethylene glycol (MIRALAX / GLYCOLAX) packet Take 17 g by mouth daily.  . sacubitril-valsartan (ENTRESTO) 49-51 MG Take 1 tablet by mouth 2 (two) times daily.  Marland Kitchen senna (SENOKOT) 8.6 MG TABS tablet Take 2 tablets (17.2 mg total) by mouth at bedtime.  Marland Kitchen spironolactone (ALDACTONE) 25 MG tablet Take 1 tablet (25 mg total) by mouth daily.   No Known Allergies Recent Results (from the past 2160 hour(s))  Basic metabolic panel     Status: Abnormal   Collection Time: 12/10/18  6:51 PM  Result Value Ref Range   Sodium 133 (L) 135 - 145 mmol/L   Potassium 3.7 3.5 - 5.1 mmol/L   Chloride 96 (L) 98 - 111 mmol/L   CO2 22 22 - 32 mmol/L  Glucose, Bld 311 (H) 70 - 99 mg/dL   BUN 14 8 - 23 mg/dL   Creatinine, Ser 0.67 0.44 - 1.00 mg/dL   Calcium 9.3 8.9 - 10.3 mg/dL   GFR calc non Af Amer >60 >60 mL/min   GFR calc Af Amer >60 >60 mL/min   Anion gap 15 5 - 15     Comment: Performed at [facility], [place] [address] [place], [place] [address]  CBC     Status: Abnormal   Collection Time: 12/10/18  6:51 PM  Result Value Ref Range   WBC 11.8 (H) 4.0 - 10.5 K/uL   RBC 5.17 (H) 3.87 - 5.11 MIL/uL   Hemoglobin 14.9 12.0 - 15.0 g/dL   HCT 45.6 36.0 - 46.0 %   MCV 88.2 80.0 - 100.0 fL   MCH 28.8 26.0 - 34.0 pg   MCHC 32.7 30.0 - 36.0 g/dL   RDW 13.9 11.5 - 15.5 %   Platelets 203 150 - 400 K/uL   nRBC 0.0 0.0 - 0.2 %    Comment: Performed at [facility], [place] [address], [place], [place] [address]  Urinalysis, Routine w reflex microscopic     Status: Abnormal   Collection Time: 12/10/18 10:38 PM  Result Value Ref Range   Color, Urine YELLOW YELLOW   APPearance CLOUDY (A) CLEAR   Specific Gravity, Urine 1.025 1.005 - 1.030   pH 5.0 5.0 - 8.0   Glucose, UA >=500 (A) NEGATIVE mg/dL   Hgb urine dipstick SMALL (A) NEGATIVE   Bilirubin Urine NEGATIVE NEGATIVE   Ketones, ur 20 (A) NEGATIVE mg/dL   Protein, ur 100 (A) NEGATIVE mg/dL   Nitrite NEGATIVE NEGATIVE   Leukocytes,Ua LARGE (A) NEGATIVE   RBC / HPF 11-20 0 - 5 RBC/hpf   WBC, UA 11-20 0 - 5 WBC/hpf   Bacteria, UA MANY (A) NONE SEEN   Squamous Epithelial / LPF 0-5 0 - 5   WBC Clumps PRESENT    Mucus PRESENT    Hyaline Casts, UA PRESENT     Comment: Performed at [facility], [place] [address], [place], [place] [address]  Urine culture     Status: Abnormal   Collection Time: 12/10/18 10:38 PM  Result Value Ref Range   Specimen Description      URINE, RANDOM Performed at [facility], [place] [address] [place], [place] [address]    Special Requests      NONE Performed at [facility], [place] [address], [place], [place] [address]    Culture >=100,000 COLONIES/mL ESCHERICHIA COLI (A)    Report Status 12/14/2018 FINAL    Organism ID, Bacteria ESCHERICHIA COLI (A)       Susceptibility   Escherichia coli -  MIC*    AMPICILLIN <=2 SENSITIVE Sensitive     CEFAZOLIN <=4 SENSITIVE Sensitive     CEFTRIAXONE <=1 SENSITIVE Sensitive     CIPROFLOXACIN <=0.25 SENSITIVE Sensitive     GENTAMICIN <=1 SENSITIVE Sensitive     IMIPENEM <=0.25 SENSITIVE Sensitive     NITROFURANTOIN <=16 SENSITIVE Sensitive     TRIMETH/SULFA <=20 SENSITIVE Sensitive     AMPICILLIN/SULBACTAM <=2 SENSITIVE Sensitive     PIP/TAZO <=4 SENSITIVE Sensitive     Extended ESBL NEGATIVE Sensitive     * >=100,000 COLONIES/mL ESCHERICHIA COLI  I-Stat Troponin, ED (not at [facility])     Status: None   Collection Time: 12/11/18  1:20 AM  Result Value Ref  Range   Troponin i, poc 0.02 0.00 - 0.08 ng/mL   Comment 3            Comment: Due to the release kinetics of cTnI, a negative result within the first hours of the onset of symptoms does not rule out myocardial infarction with certainty. If myocardial infarction is still suspected, repeat the test at appropriate intervals.   Glucose, capillary     Status: Abnormal   Collection Time: 12/11/18  5:21 AM  Result Value Ref Range   Glucose-Capillary 321 (H) 70 - 99 mg/dL  HIV antibody (Routine Testing)     Status: None   Collection Time: 12/11/18  6:05 AM  Result Value Ref Range   HIV Screen 4th Generation wRfx Non Reactive Non Reactive    Comment: (NOTE) Performed At: [facility] [place], [place] [id number] Rush Farmer MD [id number]   Basic metabolic panel     Status: Abnormal   Collection Time: 12/11/18  6:05 AM  Result Value Ref Range   Sodium 129 (L) 135 - 145 mmol/L   Potassium 3.4 (L) 3.5 - 5.1 mmol/L   Chloride 93 (L) 98 - 111 mmol/L   CO2 20 (L) 22 - 32 mmol/L   Glucose, Bld 335 (H) 70 - 99 mg/dL   BUN 15 8 - 23 mg/dL   Creatinine, Ser 0.53 0.44 - 1.00 mg/dL   Calcium 8.5 (L) 8.9 - 10.3 mg/dL   GFR calc non Af Amer >60 >60 mL/min   GFR calc Af Amer >60 >60 mL/min   Anion gap 16 (H) 5 - 15    Comment: Performed at [facility], [place] [address], [place], [place] [address]  CBC WITH DIFFERENTIAL     Status: Abnormal   Collection Time: 12/11/18  6:05 AM  Result Value Ref Range   WBC 9.1 4.0 - 10.5 K/uL   RBC 4.93 3.87 - 5.11 MIL/uL   Hemoglobin 13.7 12.0 - 15.0 g/dL   HCT 42.7 36.0 - 46.0 %   MCV 86.6 80.0 - 100.0 fL   MCH 27.8 26.0 - 34.0 pg   MCHC 32.1 30.0 - 36.0 g/dL   RDW 13.8 11.5 - 15.5 %   Platelets 181 150 - 400 K/uL   nRBC 0.0 0.0 - 0.2 %   Neutrophils Relative % 85 %   Neutro Abs 7.7 1.7 - 7.7 K/uL   Lymphocytes Relative 6 %   Lymphs Abs 0.6 (L) 0.7 - 4.0 K/uL   Monocytes Relative 9 %   Monocytes Absolute 0.8 0.1 - 1.0 K/uL   Eosinophils Relative 0 %   Eosinophils Absolute 0.0 0.0 - 0.5 K/uL   Basophils Relative 0 %   Basophils Absolute 0.0 0.0 - 0.1 K/uL   Immature Granulocytes 0 %   Abs Immature Granulocytes 0.03 0.00 - 0.07 K/uL    Comment: Performed at [facility], [place] [address] [place], [place] [address]  Hepatic function panel     Status: Abnormal   Collection Time: 12/11/18  6:05 AM  Result Value Ref Range   Total Protein 6.3 (L) 6.5 - 8.1 g/dL   Albumin 3.3 (L) 3.5 - 5.0 g/dL   AST 15 15 - 41 U/L   ALT 16 0 - 44 U/L   Alkaline Phosphatase 93 38 - 126 U/L   Total Bilirubin 1.4 (H) 0.3 - 1.2 mg/dL   Bilirubin, Direct 0.3 (H) 0.0 - 0.2 mg/dL   Indirect Bilirubin 1.1 (  H) 0.3 - 0.9 mg/dL    Comment: Performed at [facility], [place] [address], [place], [place] [address]  Lactate dehydrogenase     Status: None   Collection Time: 12/11/18  6:05 AM  Result Value Ref Range   LDH 144 98 - 192 U/L    Comment: Performed at [facility], [place] [address], [place], [place] [address]  Hemoglobin A1c     Status: Abnormal   Collection Time: 12/11/18  6:05 AM  Result Value Ref Range   Hgb A1c MFr Bld 13.8 (H) 4.8 - 5.6 %    Comment: (NOTE) Pre diabetes:          5.7%-6.4% Diabetes:              >6.4% Glycemic control for    <7.0% adults with diabetes    Mean Plasma Glucose 349.36 mg/dL    Comment: Performed at [place] [address] [place], [place] [address]  Glucose, capillary     Status: Abnormal   Collection Time: 12/11/18  7:39 AM  Result Value Ref Range   Glucose-Capillary 323 (H) 70 - 99 mg/dL  Glucose, capillary     Status: Abnormal   Collection Time: 12/11/18 10:50 AM  Result Value Ref Range   Glucose-Capillary 271 (H) 70 - 99 mg/dL  Glucose, capillary     Status: Abnormal   Collection Time: 12/11/18 12:06 PM  Result Value Ref Range   Glucose-Capillary 241 (H) 70 - 99 mg/dL  Aerobic/Anaerobic Culture (surgical/deep wound)     Status: None   Collection Time: 12/11/18  1:04 PM  Result Value Ref Range   Specimen Description      ABSCESS PERIRECTAL Performed at [place] [address], [place], [place] [address]    Special Requests      NONE Performed at [facility], [place] [address] [place], [place] [address]    Gram Stain      ABUNDANT WBC PRESENT,BOTH PMN AND MONONUCLEAR ABUNDANT GRAM POSITIVE COCCI    Culture      ABUNDANT STAPHYLOCOCCUS AUREUS RARE CANDIDA ALBICANS NO ANAEROBES ISOLATED Performed at [facility], [place] [address] [place],  [address]    Report Status 12/16/2018 FINAL    Organism ID, Bacteria STAPHYLOCOCCUS AUREUS       Susceptibility   Staphylococcus aureus - MIC*    CIPROFLOXACIN <=0.5 SENSITIVE Sensitive     ERYTHROMYCIN <=0.25 SENSITIVE Sensitive     GENTAMICIN <=0.5 SENSITIVE Sensitive     OXACILLIN 0.5 SENSITIVE Sensitive     TETRACYCLINE <=1 SENSITIVE Sensitive     VANCOMYCIN <=0.5 SENSITIVE Sensitive     TRIMETH/SULFA <=10 SENSITIVE Sensitive     CLINDAMYCIN <=0.25 SENSITIVE Sensitive     RIFAMPIN <=0.5 SENSITIVE Sensitive     Inducible Clindamycin NEGATIVE Sensitive     * ABUNDANT STAPHYLOCOCCUS AUREUS  Glucose, capillary     Status: Abnormal   Collection Time: 12/11/18  1:33 PM   Result Value Ref Range   Glucose-Capillary 218 (H) 70 - 99 mg/dL  Glucose, capillary     Status: Abnormal   Collection Time: 12/11/18  4:47 PM  Result Value Ref Range   Glucose-Capillary 234 (H) 70 - 99 mg/dL  Glucose, capillary     Status: Abnormal   Collection Time: 12/11/18  8:37 PM  Result Value Ref Range   Glucose-Capillary 278 (H) 70 - 99 mg/dL  Glucose, capillary  Status: Abnormal   Collection Time: 12/12/18 12:31 AM  Result Value Ref Range   Glucose-Capillary 255 (H) 70 - 99 mg/dL  Glucose, capillary     Status: Abnormal   Collection Time: 12/12/18  4:52 AM  Result Value Ref Range   Glucose-Capillary 257 (H) 70 - 99 mg/dL  CBC     Status: None   Collection Time: 12/12/18  6:00 AM  Result Value Ref Range   WBC 10.1 4.0 - 10.5 K/uL   RBC 4.52 3.87 - 5.11 MIL/uL   Hemoglobin 13.0 12.0 - 15.0 g/dL   HCT 40.2 36.0 - 46.0 %   MCV 88.9 80.0 - 100.0 fL   MCH 28.8 26.0 - 34.0 pg   MCHC 32.3 30.0 - 36.0 g/dL   RDW 13.9 11.5 - 15.5 %   Platelets 183 150 - 400 K/uL   nRBC 0.0 0.0 - 0.2 %    Comment: Performed at [facility], [place] [address], [place], [place] [address]  Basic metabolic panel     Status: Abnormal   Collection Time: 12/12/18  6:00 AM  Result Value Ref Range   Sodium 132 (L) 135 - 145 mmol/L   Potassium 3.3 (L) 3.5 - 5.1 mmol/L   Chloride 103 98 - 111 mmol/L   CO2 22 22 - 32 mmol/L   Glucose, Bld 255 (H) 70 - 99 mg/dL   BUN 19 8 - 23 mg/dL   Creatinine, Ser 0.58 0.44 - 1.00 mg/dL   Calcium 8.5 (L) 8.9 - 10.3 mg/dL   GFR calc non Af Amer >60 >60 mL/min   GFR calc Af Amer >60 >60 mL/min   Anion gap 7 5 - 15    Comment: Performed at [facility], [place] [address] [place], [place] [address]  Lactate dehydrogenase (pleural or peritoneal fluid)     Status: Abnormal   Collection Time: 12/12/18  9:55 AM  Result Value Ref Range   LD, Fluid 30 (H) 3 - 23 U/L    Comment: (NOTE) Results should be evaluated in conjunction with  serum values    Fluid Type-FLDH Pleural, L     Comment: Performed at [facility], [place] [address], [place], [place] [address]  Cholesterol, body fluid     Status: None   Collection Time: 12/12/18  9:55 AM  Result Value Ref Range   Cholesterol, Fluid 11 mg/dL    Comment: (NOTE) INTERPRETIVE INFORMATION: Cholesterol, Body Fluid For information on body fluid reference ranges and/or interpretive guidance visit [email or web address]be Test developed and characteristics determined by [place] [place]. See Compliance Statement B: [email or web address] Performed At: [facility] [address] [place], [place] [id number] Esau Grew MD [id number]    Chol, Fluid Type Pleural, L     Comment: Performed at [facility] [address] [place], [place] [address]  Protein, pleural or peritoneal fluid     Status: None   Collection Time: 12/12/18  9:55 AM  Result Value Ref Range   Total protein, fluid <3.0 g/dL    Comment: (NOTE) No normal range established for this test Results should be evaluated in conjunction with serum values    Fluid Type-FTP Pleural, L     Comment: Performed at [facility], [place] [address] [place], [place] [address]  Glucose, pleural or peritoneal fluid     Status: None   Collection Time: 12/12/18  9:55 AM  Result Value Ref Range   Glucose, Fluid  233 mg/dL    Comment: (NOTE) No normal range established for this test Results should be evaluated in conjunction with serum values    Fluid Type-FGLU Pleural, L     Comment: Performed at [facility], [place] [address], [place], [place] [address]  Culture, body fluid-bottle     Status: None   Collection Time: 12/12/18  9:55 AM  Result Value Ref Range   Specimen Description PLEURAL LEFT    Special Requests NONE    Culture      NO GROWTH 5 DAYS Performed at [facility], [place] [address], [place], [place] [address]     Report Status 12/17/2018 FINAL   Gram stain     Status: None   Collection Time: 12/12/18  9:55 AM  Result Value Ref Range   Specimen Description PLEURAL LEFT    Special Requests NONE    Gram Stain      NO WBC SEEN NO ORGANISMS SEEN Performed at [facility], [place] [address] [place], [place] [address]    Report Status 12/12/2018 FINAL   Glucose, capillary     Status: Abnormal   Collection Time: 12/12/18 11:51 AM  Result Value Ref Range   Glucose-Capillary 127 (H) 70 - 99 mg/dL  Glucose, capillary     Status: None   Collection Time: 12/12/18  4:18 PM  Result Value Ref Range   Glucose-Capillary 97 70 - 99 mg/dL  Glucose, capillary     Status: Abnormal   Collection Time: 12/12/18  8:49 PM  Result Value Ref Range   Glucose-Capillary 211 (H) 70 - 99 mg/dL  Glucose, capillary     Status: Abnormal   Collection Time: 12/13/18 12:12 AM  Result Value Ref Range   Glucose-Capillary 244 (H) 70 - 99 mg/dL  Glucose, capillary     Status: Abnormal   Collection Time: 12/13/18  4:36 AM  Result Value Ref Range   Glucose-Capillary 176 (H) 70 - 99 mg/dL  CBC     Status: None   Collection Time: 12/13/18  5:54 AM  Result Value Ref Range   WBC 8.3 4.0 - 10.5 K/uL   RBC 4.94 3.87 - 5.11 MIL/uL   Hemoglobin 13.9 12.0 - 15.0 g/dL   HCT 44.0 36.0 - 46.0 %   MCV 89.1 80.0 - 100.0 fL   MCH 28.1 26.0 - 34.0 pg   MCHC 31.6 30.0 - 36.0 g/dL   RDW 14.1 11.5 - 15.5 %   Platelets 232 150 - 400 K/uL   nRBC 0.0 0.0 - 0.2 %    Comment: Performed at [facility], [place] [address] [place], [place] [address]  Basic metabolic panel     Status: Abnormal   Collection Time: 12/13/18  5:54 AM  Result Value Ref Range   Sodium 135 135 - 145 mmol/L   Potassium 3.5 3.5 - 5.1 mmol/L   Chloride 104 98 - 111 mmol/L   CO2 21 (L) 22 - 32 mmol/L   Glucose, Bld 178 (H) 70 - 99 mg/dL   BUN 17 8 - 23 mg/dL   Creatinine, Ser 0.47 0.44 - 1.00 mg/dL   Calcium 8.7 (L) 8.9 - 10.3 mg/dL   GFR calc non  Af Amer >60 >60 mL/min   GFR calc Af Amer >60 >60 mL/min   Anion gap 10 5 - 15    Comment: Performed at [place], [place] [address], [place], [place] [address]  Glucose, capillary  Status: Abnormal   Collection Time: 12/13/18  7:39 AM  Result Value Ref Range   Glucose-Capillary 117 (H) 70 - 99 mg/dL  Glucose, capillary     Status: Abnormal   Collection Time: 12/13/18 11:41 AM  Result Value Ref Range   Glucose-Capillary 170 (H) 70 - 99 mg/dL  Glucose, capillary     Status: Abnormal   Collection Time: 12/13/18  3:34 PM  Result Value Ref Range   Glucose-Capillary 203 (H) 70 - 99 mg/dL  Glucose, capillary     Status: Abnormal   Collection Time: 12/13/18  5:11 PM  Result Value Ref Range   Glucose-Capillary 224 (H) 70 - 99 mg/dL  Glucose, capillary     Status: Abnormal   Collection Time: 12/13/18  8:26 PM  Result Value Ref Range   Glucose-Capillary 280 (H) 70 - 99 mg/dL  Glucose, capillary     Status: Abnormal   Collection Time: 12/14/18 12:02 AM  Result Value Ref Range   Glucose-Capillary 331 (H) 70 - 99 mg/dL  Glucose, capillary     Status: Abnormal   Collection Time: 12/14/18  3:21 AM  Result Value Ref Range   Glucose-Capillary 312 (H) 70 - 99 mg/dL  Glucose, capillary     Status: Abnormal   Collection Time: 12/14/18  7:57 AM  Result Value Ref Range   Glucose-Capillary 232 (H) 70 - 99 mg/dL  Glucose, capillary     Status: None   Collection Time: 12/14/18 11:52 AM  Result Value Ref Range   Glucose-Capillary 89 70 - 99 mg/dL   Objective  Body mass index is 20.17 kg/m. Wt Readings from Last 3 Encounters:  12/25/18 121 lb 3.2 oz (55 kg)  12/14/18 123 lb 14.4 oz (56.2 kg)  04/26/18 133 lb (60.3 kg)   Temp Readings from Last 3 Encounters:  12/25/18 97.6 F (36.4 C) (Oral)  12/14/18 98.8 F (37.1 C) (Oral)  12/24/16 97.8 F (36.6 C) (Oral)   BP Readings from Last 3 Encounters:  12/25/18 122/64  12/14/18 (!) 144/89  04/26/18 120/90   Pulse  Readings from Last 3 Encounters:  12/25/18 79  12/14/18 93  04/26/18 (!) 102    Physical Exam Vitals signs and nursing note reviewed.  Constitutional:      Appearance: Normal appearance. She is well-developed and well-groomed.  HENT:     Head: Normocephalic and atraumatic.     Nose: Nose normal.     Mouth/Throat:     Mouth: Mucous membranes are moist.     Pharynx: Oropharynx is clear.  Eyes:     Conjunctiva/sclera: Conjunctivae normal.     Pupils: Pupils are equal, round, and reactive to light.  Cardiovascular:     Rate and Rhythm: Normal rate and regular rhythm.     Heart sounds: Normal heart sounds. No murmur.     Comments: 1-2 + left leg edema  Pulmonary:     Effort: Pulmonary effort is normal.     Breath sounds: Normal breath sounds.  Musculoskeletal:     Left lower leg: Edema present.  Skin:    General: Skin is warm and dry.  Neurological:     General: No focal deficit present.     Mental Status: She is alert and oriented to person, place, and time. Mental status is at baseline.     Gait: Gait normal.  Psychiatric:        Attention and Perception: Attention and perception normal.        Mood and  Affect: Mood and affect normal.        Speech: Speech normal.        Behavior: Behavior normal. Behavior is cooperative.        Thought Content: Thought content normal.        Cognition and Memory: Cognition and memory normal.        Judgment: Judgment normal.    Head tremor on exam today   Assessment   1. 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 DM 2 uncontrolled 13.8 12/11/18  3. C/o SOB will w/u with echo + CHF (EF 25-30% from 45-50% EF 12/19/17)  Combined systolic and diastolic, h/o STEMI/CAD 4. HTN at times elevated today 122/62, HLD (with h/o CAD, intraab and intracranial atherosclerosis h/o renal artery stenosis left)  5. 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 Insomnia sleeping 2-3 hrs qhs  7. Left kidney mass enlarging noted to be hemorrhagic cyst 11/2018 Hospital admission urology consulted no further eval or w/u per Dr. Diona Fanti  8. Moderate b/l pleural effusions could be 2/2 CHF with echo not improved 12/2018  -s/p throacentesis 12/13/18 with atypical cells not definitely malignancy  9. HM Plan  1. F/u surgery upcoming appt  Will reculture urine at f/u  Complete 7 days of Keflex 500 tid   2. Current lantus 18 units and humalog 12 bid is expensive trying to get approved [employer] cares pt asst program for basaglar 18 units and humalog 12 bid signed and pharm D will fax pt has enough insulin for now  Due for eye exam last was in 2015 referred for eye exam AE h/o diabetes and b/l cataracts  Urine check in future  Foot exam future   3.  Echo 12/2018 abnormal EF 25-30% inf/septal/apical HK, LA mildly dilated, MV degenerative, mod thickening mitral valve leaflet, mild annual calcification, AV is tricuspid moderate thickening and calcification, PV regurgitation mild  F/u with Dr. Dorris Carnes  4.  Elevated BP at times added prn Hydralazine 10 mg bid prn for BP >130/>80  On coreg 25 mg bid, imdur 60 24 hr, entresto 49-51, spironolactone 25 mg qd  On lipitor 80 qhs and zetia 10 F/u with cards as above   5. botox had been rec in the past pt declined  6. Disc at f/u  7. NTD  8.  F/u cards  Consider repeat CXR CXR 12/13/18 post thoracentesis moderate pleural effusions improved reduced on right and small on left  9.  Of note pt is losing weight for next visit (consider hep C testing in future)  Flu high dose given today  Tdap 06/01/15  shingrix never had  pna 23 10/03/12 consider repeat in future  prevnar 11/14/13   Labs will need to repeat UA and culture cx and do microalbumin/creatinine in the future 01/17/19 visit   LMP ? 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 Though with weight loss EGD/colonoscopy may be preferred   Of note thoracentesis 12/13/18 with atypical cells not definitely diagnostic of malignancy   Previous PCP Dr. Arlys John   "I spent >60 minutes face-to face with patient with greater than 50% of time spent counseling and/or in coordination of care with chart review of complicated medical history/recent hospitalization 11/2018, updating PMH, psuH, coordination of care with echo, cardiology consult and medications as well as preventive HM referrals and coordination of care.    Provider: Dr. Olivia Mackie [facility]   Pre visit review using our clinic review tool, if applicable. No additional management support is needed unless otherwise documented below in the visit note.

## 2018-12-25 NOTE — Telephone Encounter (Signed)
Oncology Nurse Navigator Documentation  Oncology Nurse Navigator Flowsheets 12/25/2018  Navigator Location [facility]  Navigator Encounter Type Telephone/I called Marisa Forbes to follow up with her from yesterday.  I spoke with her husband and he gave me her phone number.  I called but was unable to reach.  I did leave a vm message with my name and phone number to call.   Telephone Outgoing Call  [employer] Needs Education  Education Other  Interventions Education  Education Method Verbal  Acuity Level 2  Time Spent with Patient 15

## 2018-12-27 ENCOUNTER — Telehealth: Payer: Self-pay | Admitting: *Deleted

## 2018-12-27 NOTE — Telephone Encounter (Signed)
Oncology Nurse Navigator Documentation  Oncology Nurse Navigator Flowsheets 12/27/2018  Navigator Location [facility]  Navigator Encounter Type Telephone/I called and spoke with Ms. Balash.  I explained to her that she does not need to be seen with oncology at this time.  I listened to her and she explained she was updated by Dr. Earlie Server office.  Referral cancelled.   Telephone Outgoing Call  [employer] Needs Education  Education Other  Interventions Education  Education Method Verbal  Acuity Level 1  Time Spent with Patient 15

## 2018-12-30 ENCOUNTER — Telehealth: Payer: Self-pay | Admitting: Pharmacist

## 2018-12-30 NOTE — Telephone Encounter (Signed)
Received message from Dr. Danae Orleans that patient cannot afford Lantus (~$400/30 day) and Humalog (~$100/30 day). She notes that she has a [employer], but is unsure if it covers prescription medications.   Contacted patient to discuss the process for patient assistance for insulin. Currently, [employer] Patient Assistance is not requiring an out of pocket spend for Medicare patients with prescription coverage, so would recommend changing Lantus to [place] (both insulin glargine) and submit for Basaglar and Humalog to [employer]. She notes that she is using Lantus 18 units daily and Humalog up to 12 units typically once, but occasionally twice daily. She notes she has "a few pens of each", so she is not emergently low on suppy.   Explained the patient assistance application process to the patient and she was agreeable to pursue this option. I will complete the application and leave the patient portion at the front of clinic for her to sign and the provider portion for Dr. Terese Door. Patient will drop off copies of 1) her and her husband's social security statements and 2) copy of her supplemental insurance card. Once all parts are received, I will fax to [facility] and follow up.   Catie Darnelle Maffucci, PharmD, [place] PGY2 Ambulatory Care Pharmacy Resident, [place] [facility] Phone: [phone]

## 2019-01-02 ENCOUNTER — Ambulatory Visit (HOSPITAL_COMMUNITY)
Admission: RE | Admit: 2019-01-02 | Discharge: 2019-01-02 | Disposition: A | Discharge status: Home / Self Care | Payer: Medicare Other | Source: Ambulatory Visit | Attending: Internal Medicine | Admitting: Internal Medicine

## 2019-01-02 DIAGNOSIS — J90 Pleural effusion, not elsewhere classified: Secondary | ICD-10-CM | POA: Insufficient documentation

## 2019-01-02 DIAGNOSIS — I5042 Chronic combined systolic (congestive) and diastolic (congestive) heart failure: Secondary | ICD-10-CM | POA: Insufficient documentation

## 2019-01-02 NOTE — Progress Notes (Signed)
  Echocardiogram 2D Echocardiogram has been performed.  Marisa Forbes 01/02/2019, 2:56 PM

## 2019-01-06 ENCOUNTER — Telehealth: Payer: Self-pay | Admitting: Internal Medicine

## 2019-01-06 ENCOUNTER — Encounter: Payer: Self-pay | Admitting: Internal Medicine

## 2019-01-06 NOTE — Telephone Encounter (Signed)
Please make appt for patient with cardiology Dr. Dorris Carnes  Already established worsening CHF per echo 01/02/2019  H/o CAD, HTN, HLD atherosclerosis

## 2019-01-06 NOTE — Telephone Encounter (Signed)
Signed the copies to be faxed and they are in [place] box on the ledge in between my and his desk in a black ledge   Thanks for your help   [place]

## 2019-01-13 ENCOUNTER — Telehealth: Payer: Self-pay | Admitting: Pharmacist

## 2019-01-13 NOTE — Telephone Encounter (Signed)
Contacted patient to f/u on paperwork for patient assistance for Basglar/Humalog through [employer]. She noted that she is still looking for the paperwork, and will bring it by clinic when she can.   Received provider portion from Dr. Terese Door. Once patient portions received, will pass to [employer], CPhT for submission and processing.   Catie Darnelle Maffucci, PharmD, [place] PGY2 Ambulatory Care Pharmacy Resident, [place] [facility] Phone: [phone]

## 2019-01-17 ENCOUNTER — Ambulatory Visit: Payer: Medicare Other | Admitting: Internal Medicine

## 2019-01-20 ENCOUNTER — Telehealth: Payer: Self-pay | Admitting: Pharmacist

## 2019-01-20 NOTE — Telephone Encounter (Signed)
Contacted patient to f/u on status of paperwork for patient assistance.   She noted that she is still working on finding the necessary paperwork, and will bring it by clinic when it is ready.   Patient portion of form is still at front desk to be signed.   Catie Darnelle Maffucci, PharmD, [place] PGY2 Ambulatory Care Pharmacy Resident, [place] [facility] Phone: [phone]

## 2019-01-26 ENCOUNTER — Telehealth: Payer: Self-pay | Admitting: *Deleted

## 2019-01-26 NOTE — Telephone Encounter (Signed)
Cancelled patient's upcoming appointment with Dr. Harrington Challenger. Dr. Harrington Challenger has spoken with patient.

## 2019-01-27 ENCOUNTER — Ambulatory Visit: Payer: Medicare Other | Admitting: Internal Medicine

## 2019-01-27 ENCOUNTER — Telehealth: Payer: Self-pay | Admitting: Internal Medicine

## 2019-01-27 NOTE — Telephone Encounter (Signed)
Fay Records, MD at 01/27/2019 9:56 AM   Status: Signed    Spoke to patient on Friday    I have reviewed echo   DIfficutl study   LVEF is down from previous but I think previous study overestimated findings    She is feeling OK    I would keep on same regimen and f/u later this spring (end of may) given current COVID pandemic Call with problems

## 2019-01-27 NOTE — Telephone Encounter (Signed)
Spoke to patient on Friday    I have reviewed echo   DIfficutl study   LVEF is down from previous but I think previous study overestimated findings    She is feeling OK    I would keep on same regimen and f/u later this spring (end of may) given current COVID pandemic Call with problems

## 2019-02-03 ENCOUNTER — Telehealth: Payer: Self-pay | Admitting: Family Medicine

## 2019-02-03 NOTE — Telephone Encounter (Signed)
Left message for patient to return call back. PEC may give and obtain information.  Need to know if patient is willing to do a virtual appointment, if so I would like a current e-mail address.  If patient is ok when telephone visit I would need a current phone number to contact patient.

## 2019-02-03 NOTE — Telephone Encounter (Signed)
Appt planned for 02/13/2019  Change to virtual?   Also due for:  A1c  Hep C screen  Foot exam  Colonoscopy  Pneumonia vaccine

## 2019-02-11 ENCOUNTER — Telehealth: Payer: Self-pay | Admitting: Pharmacist

## 2019-02-11 NOTE — Telephone Encounter (Signed)
error 

## 2019-02-13 ENCOUNTER — Ambulatory Visit: Payer: Medicare Other | Admitting: Internal Medicine

## 2019-02-14 ENCOUNTER — Telehealth: Payer: Self-pay

## 2019-02-14 NOTE — Telephone Encounter (Signed)
Called pt to schedule a virtual visit.  Pt refused and would like to wait for OV. Offered pt first available for [place] in August. Pt refused and states she will call back and make her own appointment when she is ready.

## 2019-02-27 ENCOUNTER — Other Ambulatory Visit: Payer: Medicare Other

## 2019-03-05 DIAGNOSIS — E113293 Type 2 diabetes mellitus with mild nonproliferative diabetic retinopathy without macular edema, bilateral: Secondary | ICD-10-CM | POA: Diagnosis not present

## 2019-03-05 LAB — HM DIABETES EYE EXAM

## 2019-03-06 ENCOUNTER — Telehealth: Payer: Self-pay | Admitting: Internal Medicine

## 2019-03-06 ENCOUNTER — Encounter: Payer: Self-pay | Admitting: Internal Medicine

## 2019-03-06 NOTE — Telephone Encounter (Signed)
Pt. Would rather wait and have an in office visit.

## 2019-03-21 ENCOUNTER — Telehealth: Payer: Medicare Other | Admitting: Internal Medicine

## 2019-04-04 ENCOUNTER — Other Ambulatory Visit: Payer: Self-pay | Admitting: Internal Medicine

## 2019-04-04 MED ORDER — SACUBITRIL-VALSARTAN 49-51 MG PO TABS: 1.0000 | ORAL_TABLET | Freq: Two times a day (BID) | ORAL | 2 refills | Status: DC

## 2019-04-04 NOTE — Telephone Encounter (Signed)
Pt's medication was sent to pt's pharmacy as requested. Confirmation received.  °

## 2019-04-17 ENCOUNTER — Other Ambulatory Visit: Payer: Medicare Other

## 2019-04-17 ENCOUNTER — Telehealth: Payer: Self-pay | Admitting: *Deleted

## 2019-04-17 DIAGNOSIS — Z20822 Contact with and (suspected) exposure to covid-19: Secondary | ICD-10-CM

## 2019-04-17 DIAGNOSIS — R6889 Other general symptoms and signs: Secondary | ICD-10-CM | POA: Diagnosis not present

## 2019-04-17 NOTE — Telephone Encounter (Signed)
Contacted pt too schedule testing; pt offered and accepted appointment at [facility] site 04/17/2019 at 1215; pt given address, location, and instructions that he and any occupants of his vehicle should wear masks; she verbalized understanding; orders placed per protocol.

## 2019-04-19 LAB — NOVEL CORONAVIRUS, NAA: SARS-CoV-2, NAA: NOT DETECTED

## 2019-04-22 ENCOUNTER — Ambulatory Visit: Payer: Self-pay

## 2019-04-22 NOTE — Telephone Encounter (Signed)
Called Patient related to Patient call with complaint of cough even th she tested negative for Covid-19.   Patient states disregard he message  She is fine.  Her husband made the call.

## 2019-04-25 ENCOUNTER — Ambulatory Visit
Admission: RE | Admit: 2019-04-25 | Discharge: 2019-04-25 | Disposition: A | Discharge status: Home / Self Care | Payer: Medicare Other | Source: Ambulatory Visit | Attending: Internal Medicine | Admitting: Internal Medicine

## 2019-04-25 ENCOUNTER — Other Ambulatory Visit: Payer: Self-pay

## 2019-04-25 DIAGNOSIS — Z853 Personal history of malignant neoplasm of breast: Secondary | ICD-10-CM | POA: Diagnosis not present

## 2019-04-25 DIAGNOSIS — E2839 Other primary ovarian failure: Secondary | ICD-10-CM

## 2019-04-25 DIAGNOSIS — R922 Inconclusive mammogram: Secondary | ICD-10-CM | POA: Diagnosis not present

## 2019-04-25 DIAGNOSIS — M85851 Other specified disorders of bone density and structure, right thigh: Secondary | ICD-10-CM | POA: Diagnosis not present

## 2019-04-25 DIAGNOSIS — Z780 Asymptomatic menopausal state: Secondary | ICD-10-CM | POA: Diagnosis not present

## 2019-05-06 ENCOUNTER — Telehealth: Payer: Self-pay

## 2019-05-06 NOTE — Telephone Encounter (Signed)
I called patient, but ws unable to LM. Patient needs to be scheduled for f/u to close 5 [facility] patient gaps.

## 2019-05-14 ENCOUNTER — Other Ambulatory Visit: Payer: Self-pay | Admitting: Internal Medicine

## 2019-05-27 ENCOUNTER — Telehealth: Payer: Self-pay

## 2019-05-27 NOTE — Telephone Encounter (Signed)
I spoke with patient & she is scheduled Thursday to close the 5 [facility] patient gaps.

## 2019-05-29 ENCOUNTER — Encounter (HOSPITAL_COMMUNITY): Payer: Self-pay

## 2019-05-29 ENCOUNTER — Emergency Department (HOSPITAL_COMMUNITY)
Admission: EM | Admit: 2019-05-29 | Discharge: 2019-05-29 | Disposition: A | Discharge status: Home / Self Care | Payer: Medicare Other | Attending: Emergency Medicine | Admitting: Emergency Medicine

## 2019-05-29 ENCOUNTER — Telehealth: Payer: Self-pay | Admitting: Internal Medicine

## 2019-05-29 ENCOUNTER — Ambulatory Visit: Payer: Medicare Other | Admitting: Internal Medicine

## 2019-05-29 ENCOUNTER — Other Ambulatory Visit: Payer: Self-pay

## 2019-05-29 ENCOUNTER — Emergency Department (HOSPITAL_COMMUNITY): Payer: Medicare Other

## 2019-05-29 DIAGNOSIS — I252 Old myocardial infarction: Secondary | ICD-10-CM | POA: Insufficient documentation

## 2019-05-29 DIAGNOSIS — Z7982 Long term (current) use of aspirin: Secondary | ICD-10-CM | POA: Insufficient documentation

## 2019-05-29 DIAGNOSIS — J189 Pneumonia, unspecified organism: Secondary | ICD-10-CM

## 2019-05-29 DIAGNOSIS — Z853 Personal history of malignant neoplasm of breast: Secondary | ICD-10-CM | POA: Diagnosis not present

## 2019-05-29 DIAGNOSIS — I5042 Chronic combined systolic (congestive) and diastolic (congestive) heart failure: Secondary | ICD-10-CM | POA: Diagnosis not present

## 2019-05-29 DIAGNOSIS — Z955 Presence of coronary angioplasty implant and graft: Secondary | ICD-10-CM | POA: Insufficient documentation

## 2019-05-29 DIAGNOSIS — E119 Type 2 diabetes mellitus without complications: Secondary | ICD-10-CM | POA: Insufficient documentation

## 2019-05-29 DIAGNOSIS — Z20828 Contact with and (suspected) exposure to other viral communicable diseases: Secondary | ICD-10-CM | POA: Insufficient documentation

## 2019-05-29 DIAGNOSIS — Z79899 Other long term (current) drug therapy: Secondary | ICD-10-CM | POA: Insufficient documentation

## 2019-05-29 DIAGNOSIS — I2510 Atherosclerotic heart disease of native coronary artery without angina pectoris: Secondary | ICD-10-CM | POA: Insufficient documentation

## 2019-05-29 DIAGNOSIS — Z8673 Personal history of transient ischemic attack (TIA), and cerebral infarction without residual deficits: Secondary | ICD-10-CM | POA: Diagnosis not present

## 2019-05-29 DIAGNOSIS — I110 Hypertensive heart disease with heart failure: Secondary | ICD-10-CM | POA: Diagnosis not present

## 2019-05-29 DIAGNOSIS — Z794 Long term (current) use of insulin: Secondary | ICD-10-CM | POA: Insufficient documentation

## 2019-05-29 DIAGNOSIS — R0602 Shortness of breath: Secondary | ICD-10-CM | POA: Diagnosis not present

## 2019-05-29 DIAGNOSIS — R9431 Abnormal electrocardiogram [ECG] [EKG]: Secondary | ICD-10-CM | POA: Diagnosis not present

## 2019-05-29 LAB — CBC WITH DIFFERENTIAL/PLATELET
Abs Immature Granulocytes: 0.06 10*3/uL (ref 0.00–0.07)
Basophils Absolute: 0 10*3/uL (ref 0.0–0.1)
Basophils Relative: 0 %
Eosinophils Absolute: 0 10*3/uL (ref 0.0–0.5)
Eosinophils Relative: 0 %
HCT: 42.3 % (ref 36.0–46.0)
Hemoglobin: 13.9 g/dL (ref 12.0–15.0)
Immature Granulocytes: 1 %
Lymphocytes Relative: 10 %
Lymphs Abs: 1.1 10*3/uL (ref 0.7–4.0)
MCH: 29.5 pg (ref 26.0–34.0)
MCHC: 32.9 g/dL (ref 30.0–36.0)
MCV: 89.8 fL (ref 80.0–100.0)
Monocytes Absolute: 1 10*3/uL (ref 0.1–1.0)
Monocytes Relative: 9 %
Neutro Abs: 8.6 10*3/uL — ABNORMAL HIGH (ref 1.7–7.7)
Neutrophils Relative %: 80 %
Platelets: 208 10*3/uL (ref 150–400)
RBC: 4.71 MIL/uL (ref 3.87–5.11)
RDW: 11.9 % (ref 11.5–15.5)
WBC: 10.7 10*3/uL — ABNORMAL HIGH (ref 4.0–10.5)
nRBC: 0 % (ref 0.0–0.2)

## 2019-05-29 LAB — COMPREHENSIVE METABOLIC PANEL
ALT: 13 U/L (ref 0–44)
AST: 11 U/L — ABNORMAL LOW (ref 15–41)
Albumin: 3.5 g/dL (ref 3.5–5.0)
Alkaline Phosphatase: 91 U/L (ref 38–126)
Anion gap: 15 (ref 5–15)
BUN: 25 mg/dL — ABNORMAL HIGH (ref 8–23)
CO2: 24 mmol/L (ref 22–32)
Calcium: 9.4 mg/dL (ref 8.9–10.3)
Chloride: 94 mmol/L — ABNORMAL LOW (ref 98–111)
Creatinine, Ser: 0.65 mg/dL (ref 0.44–1.00)
GFR calc Af Amer: >60 mL/min (ref >60)
GFR calc non Af Amer: >60 mL/min (ref >60)
Glucose, Bld: 461 mg/dL — ABNORMAL HIGH (ref 70–99)
Potassium: 4.4 mmol/L (ref 3.5–5.1)
Sodium: 133 mmol/L — ABNORMAL LOW (ref 135–145)
Total Bilirubin: 1.1 mg/dL (ref 0.3–1.2)
Total Protein: 7.1 g/dL (ref 6.5–8.1)

## 2019-05-29 MED ORDER — AZITHROMYCIN 250 MG PO TABS: ORAL_TABLET | ORAL | 0 refills | Status: DC

## 2019-05-29 MED ORDER — AMOXICILLIN-POT CLAVULANATE 875-125 MG PO TABS: 1.0000 | ORAL_TABLET | Freq: Two times a day (BID) | ORAL | 0 refills | Status: AC

## 2019-05-29 NOTE — Telephone Encounter (Signed)
Patient daughter called with concerns mother to weak to get up for coffee, and coughing continuous , sounds to daughter like lungs filling with fluid. Daughter is going to try to get patient to go to ER , will call daughter back and confirm going to ER.

## 2019-05-29 NOTE — Telephone Encounter (Signed)
Spoke with pt advised she go to ED given her symptoms c/w CHF exacerbation last echo 12/2018 EF 25-30%  TMS

## 2019-05-29 NOTE — Telephone Encounter (Signed)
Pt called back stating she had missed a call from the office and had a virtual visit scheduled for today @ 1:30 pm but has already spoken to Dr. Olivia Mackie who advised her to go to ED.  Pt said she was on her way to [facility].

## 2019-05-29 NOTE — Telephone Encounter (Signed)
C/o sob, fatigue weight is stable 113 lbs, no leg swelling and cough  H/o CHF EF 01/02/2019 EF 25-30%   I rec she go to [facility] ED for evaluation c/w with CHF exacerbation    [place]

## 2019-05-29 NOTE — Telephone Encounter (Signed)
Patient currently in ED.

## 2019-05-29 NOTE — Telephone Encounter (Signed)
Called and spoke to [place], pt's daughter.  Pt's daughter said that pt refused to go ED.  Daughter said pt plans to keep her virtual visit appt w/ Dr. Aundra Dubin today.  Daughter said that pt also had plans to visit her today and she would check pt when she saw her.  Spoke w/ Dr. Aundra Dubin.  Dr. Aundra Dubin made phone call to speak w/ pt.

## 2019-05-29 NOTE — ED Triage Notes (Signed)
Patient c/o SOB and weakness x 2 days. Patient reports a history of CHF.

## 2019-05-29 NOTE — ED Provider Notes (Signed)
[facility] DEPT Provider Note   CSN: [id number] Arrival date & time: 05/29/19  1444    History   Chief Complaint Chief Complaint  Patient presents with  . Shortness of Breath    HPI Marisa Forbes is a 68 y.o. female with a PMH significant for CHF with EF of 25 to 30%, breast cancer status post left lumpectomy, CAD and STEMI status post stent placement who presents with shortness of breath, cough, and weakness.  She says that her symptoms started on Monday.  She began to have difficulty taking deep breaths and every time she would breathe, she would cough.  She also felt fatigued and weak.  She denies fever.  She says she has weighed herself every day and her weight has remained stable.  She also says she has not had any increased abdominal girth or pedal edema.  She has no known exposures to [place] but does say she goes to the drugstore in the grocery store.  She tested negative for COVID about 3 weeks ago.    Past Medical History:  Diagnosis Date  . Arthritis    back  . Balance problems    due to stroke  . Benign head tremor    per pt due to muscle spasm in neck   . Breast cancer ([place]) 12/03/12   a. 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 S/p chemo with CMF and radiation.  . Cataracts, bilateral   . CHF (congestive heart failure) ([place])    On Monday she said her cardiologist was concerned for possible CHF, but is now improving-per pt.(07/26/15)  . CHF (congestive heart failure) ([place])   . Coronary artery disease    a. STEMI 08/2012: s/p DES to LAD, PTCA to downstream LAD. b. Relook cath same hospitalization: stable post-PCI anatomy with Stable Diag lesions (unlikely cause of resting angina, not good PCI targets), stable RCA lesion (non flow limiting).  . Diabetes mellitus without complication ([place])    Takes insulin and metformin  . Dysplasia of cervix, low grade (CIN 1)    in 02 s/p LEEP   . GERD (gastroesophageal  reflux disease)    food related  . History of cancer chemotherapy    last in August 2014  . 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 History of radiation therapy 06/23/2013-08/08/2013   62.4 gray to left breast  . HTN (hypertension)   . Hypercholesteremia   . Insomnia   . Left kidney mass   . LV dysfunction    a. EF 45-50% at time of STEMI 08/2012. b. Echo 04/2013: EF 45-50%, mild focal basal hypertrophy of septum, grade 1 d/d.  Marland Kitchen Myocardial infarction ([place]) 09/07/12  . Peripheral vision loss    left  . Perirectal abscess    11/2018   . Pleural effusion, bilateral   . Pneumonia   . Pneumonia   . Renal artery stenosis ([facility])    a. 20% L RAS in 08/2012 by cath. b. Duplex 07/2012: >60% L RAS.  . Stroke, embolic ([place])    a. 84/6962 post cath. with residual reduced vision in left eye; noted right occiptal lobe/PCA infarct   . UTI (urinary tract infection)   . UTI (urinary tract infection)   . Vasovagal syncope    a. 04/2013 - echo stable compared to prior EF  45-50%, negative carotid duplex.    Patient Active Problem List   Diagnosis Date Noted  . Insomnia 12/25/2018  . Peri-rectal abscess 12/12/2018  . Perirectal abscess 12/11/2018  . Left renal mass 12/11/2018  . Pleural effusion 12/11/2018  . Lower urinary tract infectious disease   . Proctitis   . Unintentional weight loss   . Chronic combined systolic (congestive) and diastolic (congestive) heart failure ([place]) 01/22/2018  . Elevated troponin 12/22/2016  . Chest pain, rule out acute myocardial infarction 07/29/2015  . 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 DM (diabetes mellitus), type 2 ([place])   . Ischemic cardiomyopathy   . Congestive dilated cardiomyopathy ([place]) 04/13/2015  . 3rd cranial nerve palsy   . Diplopia   . CVA (cerebral infarction) 04/11/2015   . Renal artery stenosis ([place])   . LV dysfunction   . Benign paroxysmal positional vertigo 05/19/2013  . Cancer of upper-inner quadrant of female breast ([place]) 12/06/2012  . Cervical dystonia 10/18/2012  . Peripheral neuropathy 10/18/2012  . Other screening mammogram 10/03/2012  . History of completed stroke 09/12/2012    Class: Acute  . Hyperlipidemia with target LDL less than 70 09/09/2012  . CAD (coronary artery disease) 09/09/2012  . NSVT, 5 beats 11/10 09/09/2012  . Hypertensive heart disease with CHF (congestive heart failure) ([place]) 09/07/2012    Past Surgical History:  Procedure Laterality Date  . BACK SURGERY     x3, lower back  . BREAST BIOPSY Left 12/03/2012   U/S Core- Malignant  . BREAST LUMPECTOMY Left 01/08/2013  . BREAST LUMPECTOMY WITH NEEDLE LOCALIZATION AND AXILLARY SENTINEL LYMPH NODE BX Left 01/08/2013   Procedure: LEFT BREAST WIRE GUIDED  LUMPECTOMY AND LEFT AXILLARY SENTINEL  NODE BX;  Surgeon: Rolm Bookbinder, MD;  Location: [facility];  Service: General;  Laterality: Left;  . CATARACT EXTRACTION Bilateral   . CERVICAL BIOPSY  W/ LOOP ELECTRODE EXCISION     02/2001  . CORONARY ANGIOPLASTY WITH STENT PLACEMENT    . EYE SURGERY     b/l cataract   . INCISION AND DRAINAGE PERIRECTAL ABSCESS N/A 12/11/2018   Procedure: IRRIGATION AND DEBRIDEMENT PERIRECTAL ABSCESS;  Surgeon: Johnathan Hausen, MD;  Location: [facility];  Service: General;  Laterality: N/A;  . LEFT HEART CATHETERIZATION WITH CORONARY ANGIOGRAM N/A 09/07/2012   Procedure: LEFT HEART CATHETERIZATION WITH CORONARY ANGIOGRAM;  Surgeon: Troy Sine, MD;  Location[facility] C[facility] LAB;  Service: Cardiovascular;  Laterality: N/A;  . LEFT HEART CATHETERIZATION WITH CORONARY ANGIOGRAM  09/09/2012   Procedure: LEFT HEART CATHETERIZATION WITH CORONARY ANGIOGRAM;  Surgeon: Leonie Man, MD;  Location: [facility] CATH LAB;  Service: Cardiovascular;;  . PERCUTANEOUS CORONARY STENT INTERVENTION (PCI-S) N/A 09/07/2012   Procedure:  PERCUTANEOUS CORONARY STENT INTERVENTION (PCI-S);  Surgeon: Troy Sine, MD;  Location: [facility] CATH LAB;  Service: Cardiovascular;  Laterality: N/A;  . PORT-A-CATH REMOVAL N/A 10/14/2013   Procedure: REMOVAL PORT-A-CATH;  Surgeon: Rolm Bookbinder, MD;  Location: [facility];  Service: General;  Laterality: N/A;  . PORTACATH PLACEMENT Right 02/17/2013   Procedure: INSERTION PORT-A-CATH;  Surgeon: Rolm Bookbinder, MD;  Location: [facility];  Service: General;  Laterality: Right;  . THORACENTESIS     12/13/2018 atypical cells not definitely diag. of malignancy      OB History   No obstetric history on file.      Home Medications    Prior to Admission medications   Medication Sig Start Date End Date Taking? Authorizing Provider  amoxicillin-clavulanate (AUGMENTIN) 875-125 MG tablet Take  1 tablet by mouth 2 (two) times daily for 7 days. 05/29/19 06/05/19  Kathrene Alu, MD  aspirin 81 MG EC tablet Take 1 tablet (81 mg total) by mouth daily. 04/16/15   Barton Dubois, MD  atorvastatin (LIPITOR) 80 MG tablet Take 1 tablet (80 mg total) by mouth daily. Please keep upcoming appt in August with Dr. Harrington Challenger for future refills. Thank you 05/14/19   Fay Records, MD  azithromycin (ZITHROMAX) 250 MG tablet Take 2 pills the first day and 1 pill each day on days 2 through 5. 05/29/19   Winfrey, Alcario Drought, MD  carvedilol (COREG) 25 MG tablet TAKE 1 TABLET BY MOUTH TWICE A DAY WITH MEALS Please keep upcoming appt in August with Dr. Harrington Challenger for future refills. Thank you 05/14/19   Fay Records, MD  ezetimibe (ZETIA) 10 MG tablet TAKE 1 TABLET BY MOUTH EVERY DAY Please keep upcoming appt in August with Dr. Harrington Challenger for future refills. Thank you 05/14/19   Fay Records, MD  feeding supplement, ENSURE ENLIVE, (ENSURE ENLIVE) LIQD Take 237 mLs by mouth 2 (two) times daily as needed (If PO intakes of meals are poor). 12/14/18   Kayleen Memos, DO  furosemide (LASIX) 40 MG tablet Take 1 tablet (40 mg total) by mouth daily. Please keep  upcoming appt in August with Dr. Harrington Challenger for future refills. Thank you 05/14/19   Fay Records, MD  glucose blood [facility] VERIO) test strip Use TID 10/03/12   Janith Lima, MD  hydrALAZINE (APRESOLINE) 10 MG tablet Take 1 tablet (10 mg total) by mouth 2 (two) times daily as needed. For BP >130/>80 12/25/18   McLean-Scocuzza, Nino Glow, MD  Insulin Glargine (LANTUS SOLOSTAR) 100 UNIT/ML Solostar Pen Inject 18 units [place] at bedtime. 12/14/18   Kayleen Memos, DO  insulin lispro (HUMALOG) 100 UNIT/ML injection Inject 0.1-0.12 mLs (10-12 Units total) into the skin every morning. 12/14/18   Kayleen Memos, DO  isosorbide mononitrate (IMDUR) 60 MG 24 hr tablet Take 1 tablet (60 mg total) by mouth daily. Please keep upcoming appt in August with Dr. Harrington Challenger for future refills. Thank you 05/14/19   Fay Records, MD  nitroGLYCERIN (NITROSTAT) 0.4 MG SL tablet Place 1 tablet (0.4 mg total) under the tongue every 5 (five) minutes as needed for chest pain. 04/26/18   Fay Records, MD  polyethylene glycol [facility] Floria Raveling) packet Take 17 g by mouth daily. 12/15/18   Kayleen Memos, DO  sacubitril-valsartan (ENTRESTO) 49-51 MG Take 1 tablet by mouth 2 (two) times daily. Please keep upcoming appt in August with Dr. Harrington Challenger for future refills. Thank you 04/04/19   Fay Records, MD  senna (SENOKOT) 8.6 MG TABS tablet Take 2 tablets (17.2 mg total) by mouth at bedtime. 12/14/18   Kayleen Memos, DO  spironolactone (ALDACTONE) 25 MG tablet Take 1 tablet (25 mg total) by mouth daily. 04/26/18   Fay Records, MD    Family History Family History  Problem Relation Age of Onset  . COPD Mother   . Heart disease Father   . Hypertension Father   . Diabetes Brother   . Hypertension Brother   . Learning disabilities Brother   . Hypertension Brother   . Asthma Daughter   . Cancer Neg Hx   . Hyperlipidemia Neg Hx   . Kidney disease Neg Hx   . Stroke Neg Hx     Social History Social History   Tobacco  Use  . Smoking status:  Never Smoker  . Smokeless tobacco: Never Used  Substance Use Topics  . Alcohol use: Not Currently    Comment: socially  . Drug use: No     Allergies   Patient has no known allergies.   Review of Systems Review of Systems  Constitutional: Positive for activity change and fatigue. Negative for appetite change, chills, fever and unexpected weight change.  HENT: Negative for congestion and rhinorrhea.   Respiratory: Positive for cough and shortness of breath. Negative for chest tightness.   Cardiovascular: Negative for chest pain, palpitations and leg swelling.  Gastrointestinal: Negative for abdominal distention, abdominal pain, constipation and diarrhea.  Genitourinary: Negative for dysuria.  Neurological: Negative for light-headedness and headaches.  Psychiatric/Behavioral: The patient is not nervous/anxious.      Physical Exam Updated Vital Signs BP (!) 160/86   Pulse 92   Temp 98.9 F (37.2 C)   Resp (!) 26   Ht _0  (1.651 m)   Wt 51.3 kg   SpO2 95%   BMI 18.80 kg/m   Physical Exam Constitutional:      General: She is not in acute distress.    Comments: Thin, elderly appearing  HENT:     Head: Normocephalic and atraumatic.     Mouth/Throat:     Mouth: Mucous membranes are moist.  Eyes:     Extraocular Movements: Extraocular movements intact.  Neck:     Musculoskeletal: Normal range of motion.  Cardiovascular:     Rate and Rhythm: Normal rate and regular rhythm.  Pulmonary:     Breath sounds: Examination of the right-middle field reveals decreased breath sounds. Examination of the right-lower field reveals decreased breath sounds. Decreased breath sounds present. No wheezing, rhonchi or rales.  Abdominal:     General: Bowel sounds are normal.     Palpations: Abdomen is soft.  Musculoskeletal: Normal range of motion.     Right lower leg: She exhibits no tenderness. No edema.     Left lower leg: She exhibits no tenderness. No edema.  Skin:    General:  Skin is warm.  Neurological:     General: No focal deficit present.     Mental Status: She is alert and oriented to person, place, and time.  Psychiatric:        Mood and Affect: Mood normal.        Behavior: Behavior normal.      ED Treatments / Results  Labs (all labs ordered are listed, but only abnormal results are displayed) Labs Reviewed  CBC WITH DIFFERENTIAL/PLATELET - Abnormal; Notable for the following components:      Result Value   WBC 10.7 (*)    Neutro Abs 8.6 (*)    All other components within normal limits  COMPREHENSIVE METABOLIC PANEL - Abnormal; Notable for the following components:   Sodium 133 (*)    Chloride 94 (*)    Glucose, Bld 461 (*)    BUN 25 (*)    AST 11 (*)    All other components within normal limits  NOVEL CORONAVIRUS, NAA (HOSPITAL ORDER, SEND-OUT TO REF LAB)    EKG None  Radiology Dg Chest 2 View  Result Date: 05/29/2019 CLINICAL DATA:  Shortness of breath and weakness for 2 days. EXAM: CHEST - 2 VIEW COMPARISON:  Single-view of the chest 12/13/2018. FINDINGS: Patchy airspace disease is seen throughout the right lower lobe most consistent with pneumonia. Left lung is clear. No pneumothorax or pleural fluid.  Heart size is normal. Surgical clips left breast noted. IMPRESSION: Patchy airspace disease throughout the right lower lobe most consistent with pneumonia. Electronically Signed   By: Inge Rise M.D.   On: 05/29/2019 15:21    Procedures Procedures (including critical care time)  Medications Ordered in ED Medications - No data to display   Initial Impression / Assessment and Plan / ED Course  I have reviewed the triage vital signs and the nursing notes.  Pertinent labs & imaging results that were available during my care of the patient were reviewed by me and considered in my medical decision making (see chart for details).       CXR and patient history consistent with pneumonia.  CHF exacerbation less likely due to lack  of weight gain, no pedal edema, and no effusions on CXR.  Will obtain CBC and CMP as well as COVID send out test.  Patient will likely be able to discharge home with oral antibiotics for treatment of CAP.  Patient CMP was significant for a glucose of 461 with normal bicarbonate and anion gap.  She was counseled to take insulin when she gets home and to closely monitor her blood sugars.  She expressed understanding.  She was discharged home with prescription for Augmentin 875-125 mg twice daily for 7 days and azithromycin for 7 days.  She was encouraged to follow-up with her PCP if her symptoms do not improve in the next 2 to 3 days.  She was told that she will be informed of the results of her COVID test in the next few days.  Final Clinical Impressions(s) / ED Diagnoses   Final diagnoses:  Community acquired pneumonia of right lower lobe of lung [facility])    ED Discharge Orders         Ordered    amoxicillin-clavulanate (AUGMENTIN) 875-125 MG tablet  2 times daily     05/29/19 2052    azithromycin (ZITHROMAX) 250 MG tablet     05/29/19 2052           Kathrene Alu, MD 05/29/19 2052    Carmin Muskrat, MD 05/29/19 2206

## 2019-05-29 NOTE — Discharge Instructions (Signed)
Please pick up your medications for your pneumonia at the [facility] on [address].  The instructions are included on the medicine bottle, but you will take Augmentin twice daily for 7 days and azithromycin for 5 days.  The first day of the azithromycin, you will take 2 pills instead of 1.  Please also make sure to check your blood sugar frequently since it was quite high here.  We hope you feel better, but if you do not in the next few days, please see your doctor.

## 2019-05-31 LAB — NOVEL CORONAVIRUS, NAA (HOSP ORDER, SEND-OUT TO REF LAB; TAT 18-24 HRS): SARS-CoV-2, NAA: NOT DETECTED

## 2019-06-12 ENCOUNTER — Telehealth: Payer: Self-pay | Admitting: Internal Medicine

## 2019-06-12 NOTE — Telephone Encounter (Signed)
Mail to pt must take Rx to medical supply store  [place]

## 2019-06-12 NOTE — Telephone Encounter (Signed)
Pt called and is requesting a prescription be sent in for her to receive a shower and a toilet chair. Please advise.

## 2019-06-13 NOTE — Telephone Encounter (Signed)
Completed and mailed

## 2019-06-18 NOTE — Progress Notes (Signed)
Cardiology Office Note   Date:  06/19/2019   ID:  Marisa Forbes, Marisa Forbes 1951-03-03, MRN [id number]  PCP:  McLean-Scocuzza, Nino Glow, MD  Cardiologist:   Dorris Carnes, MD   No chief complaint on file.  F/U of CAD      History of Present Illness: Marisa Forbes is a 68 y.o. female with a history of HTN, breast CA, DM, CAD (STEMI 2013 with DES to LAD and PTCA to distal LAD)  and embolic CVA.  Pt also with RAS.   At one point  LVEF 20 to 25%  Admitted iwht ? Palsy vs TIA in June 2016   Stess MRI showed scar and minimal ischemia     F/U echo 40 to 45%   Admited Dec 2017 to [facility] with decompensated CHF   Echo LVEF 30 to 35%  NSVT    Cardiac MR in Jan 2018:   Sar in the anteior, anteroseptal, apical walls     Admitted in Jan 2019 to [place] with CHF, hypertensive urgency   BP 220 on arriva    Echo LVEF 45 to 50%  With akinesis of apex, anteroseptal wall    Myovue  Scan in Feb sowed scar but no ischemia   Plan for medical Rx  Not on entresto due to cost  I saw the pt in clinic back in June 2019     Since I saw her she was seen by PCP who ordered echo   Noted below  REferred back to cardiology  The pt had a pneumonia in July 2020     Treated with ABX   She has recovered    She currently denies CP   Breathing is OK.  No dizziness  No edema    Outpatient Medications Prior to Visit  Medication Sig Dispense Refill  . aspirin 81 MG EC tablet Take 1 tablet (81 mg total) by mouth daily. 30 tablet 12  . atorvastatin (LIPITOR) 80 MG tablet Take 1 tablet (80 mg total) by mouth daily. Please keep upcoming appt in August with Dr. Harrington Challenger for future refills. Thank you 30 tablet 1  . azithromycin (ZITHROMAX) 250 MG tablet Take 2 pills the first day and 1 pill each day on days 2 through 5. 6 each 0  . carvedilol (COREG) 25 MG tablet TAKE 1 TABLET BY MOUTH TWICE A DAY WITH MEALS Please keep upcoming appt in August with Dr. Harrington Challenger for future refills. Thank you 60 tablet 1  . ezetimibe (ZETIA) 10 MG tablet  TAKE 1 TABLET BY MOUTH EVERY DAY Please keep upcoming appt in August with Dr. Harrington Challenger for future refills. Thank you 30 tablet 1  . feeding supplement, ENSURE ENLIVE, (ENSURE ENLIVE) LIQD Take 237 mLs by mouth 2 (two) times daily as needed (If PO intakes of meals are poor). 7 Bottle 12  . furosemide (LASIX) 40 MG tablet Take 1 tablet (40 mg total) by mouth daily. Please keep upcoming appt in August with Dr. Harrington Challenger for future refills. Thank you 30 tablet 1  . glucose blood (ONETOUCH VERIO) test strip Use TID 100 each 12  . hydrALAZINE (APRESOLINE) 10 MG tablet Take 1 tablet (10 mg total) by mouth 2 (two) times daily as needed. For BP >130/>80 90 tablet 2  . Insulin Glargine (LANTUS SOLOSTAR) 100 UNIT/ML Solostar Pen Inject 18 units Lehr at bedtime. 5 pen 0  . insulin lispro (HUMALOG) 100 UNIT/ML injection Inject 0.1-0.12 mLs (10-12 Units total) into the skin every morning. 10  mL 0  . isosorbide mononitrate (IMDUR) 60 MG 24 hr tablet Take 1 tablet (60 mg total) by mouth daily. Please keep upcoming appt in August with Dr. Harrington Challenger for future refills. Thank you 30 tablet 1  . nitroGLYCERIN (NITROSTAT) 0.4 MG SL tablet Place 1 tablet (0.4 mg total) under the tongue every 5 (five) minutes as needed for chest pain. 25 tablet 11  . polyethylene glycol (MIRALAX / GLYCOLAX) packet Take 17 g by mouth daily. 14 each 0  . sacubitril-valsartan (ENTRESTO) 49-51 MG Take 1 tablet by mouth 2 (two) times daily. Please keep upcoming appt in August with Dr. Harrington Challenger for future refills. Thank you 60 tablet 2  . senna (SENOKOT) 8.6 MG TABS tablet Take 2 tablets (17.2 mg total) by mouth at bedtime. 60 each 0  . spironolactone (ALDACTONE) 25 MG tablet Take 1 tablet (25 mg total) by mouth daily. 90 tablet 11   No facility-administered medications prior to visit.      Allergies:   Patient has no known allergies.   Past Medical History:  Diagnosis Date  . Arthritis    back  . Balance problems    due to stroke  . Benign head tremor     per pt due to muscle spasm in neck   . Breast cancer ([place]) 12/03/12   a. 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 S/p chemo with CMF and radiation.  . Cataracts, bilateral   . CHF (congestive heart failure) ([place])    On Monday she said her cardiologist was concerned for possible CHF, but is now improving-per pt.(07/26/15)  . CHF (congestive heart failure) ([place])   . Coronary artery disease    a. STEMI 08/2012: s/p DES to LAD, PTCA to downstream LAD. b. Relook cath same hospitalization: stable post-PCI anatomy with Stable Diag lesions (unlikely cause of resting angina, not good PCI targets), stable RCA lesion (non flow limiting).  . Diabetes mellitus without complication ([place])    Takes insulin and metformin  . Dysplasia of cervix, low grade (CIN 1)    in 02 s/p LEEP   . GERD (gastroesophageal reflux disease)    food related  . History of cancer chemotherapy    last in August 2014  . 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 History of radiation therapy 06/23/2013-08/08/2013   62.4 gray to left breast  . HTN (hypertension)   . Hypercholesteremia   . Insomnia   . Left kidney mass   . LV dysfunction    a. EF 45-50% at time of STEMI 08/2012. b. Echo 04/2013: EF 45-50%, mild focal basal hypertrophy of septum, grade 1 d/d.  Marland Kitchen Myocardial infarction ([place]) 09/07/12  . Peripheral vision loss    left  . Perirectal abscess    11/2018   . Pleural effusion, bilateral   . Pneumonia   . Pneumonia   . Renal artery stenosis ([facility])    a. 20% L RAS in 08/2012 by cath. b. Duplex 07/2012: >60% L RAS.  . Stroke, embolic ([place])    a. 76/8088 post cath. with residual reduced vision in left eye; noted right occiptal lobe/PCA infarct   . UTI (urinary tract infection)   . UTI (urinary tract infection)   .  Vasovagal syncope    a. 04/2013 - echo stable compared to prior EF 45-50%, negative carotid duplex.  Past Surgical History:  Procedure Laterality Date  . BACK SURGERY     x3, lower back  . BREAST BIOPSY Left 12/03/2012   U/S Core- Malignant  . BREAST LUMPECTOMY Left 01/08/2013  . BREAST LUMPECTOMY WITH NEEDLE LOCALIZATION AND AXILLARY SENTINEL LYMPH NODE BX Left 01/08/2013   Procedure: LEFT BREAST WIRE GUIDED  LUMPECTOMY AND LEFT AXILLARY SENTINEL  NODE BX;  Surgeon: Rolm Bookbinder, MD;  Location: [facility] [place];  Service: General;  Laterality: Left;  . CATARACT EXTRACTION Bilateral   . CERVICAL BIOPSY  W/ LOOP ELECTRODE EXCISION     02/2001  . CORONARY ANGIOPLASTY WITH STENT PLACEMENT    . EYE SURGERY     b/l cataract   . INCISION AND DRAINAGE PERIRECTAL ABSCESS N/A 12/11/2018   Procedure: IRRIGATION AND DEBRIDEMENT PERIRECTAL ABSCESS;  Surgeon: Johnathan Hausen, MD;  Location: [facility];  Service: General;  Laterality: N/A;  . LEFT HEART CATHETERIZATION WITH CORONARY ANGIOGRAM N/A 09/07/2012   Procedure: LEFT HEART CATHETERIZATION WITH CORONARY ANGIOGRAM;  Surgeon: Troy Sine, MD;  Location[facility] CATH LAB;  Service: Cardiovascular;  Laterality: N/A;  . LEFT HEART CATHETERIZATION WITH CORONARY ANGIOGRAM  09/09/2012   Procedure: LEFT HEART CATHETERIZATION WITH CORONARY ANGIOGRAM;  Surgeon: Leonie Man, MD;  Location: [facility] CATH LAB;  Service: Cardiovascular;;  . PERCUTANEOUS CORONARY STENT INTERVENTION (PCI-S) N/A 09/07/2012   Procedure: PERCUTANEOUS CORONARY STENT INTERVENTION (PCI-S);  Surgeon: Troy Sine, MD;  Location[facility] C[facility] LAB;  Service: Cardiovascular;  Laterality: N/A;  . PORT-A-CATH REMOVAL N/A 10/14/2013   Procedure: REMOVAL PORT-A-CATH;  Surgeon: Rolm Bookbinder, MD;  Location: [facility];  Service: General;  Laterality: N/A;  . PORTACATH PLACEMENT Right 02/17/2013   Procedure: INSERTION PORT-A-CATH;  Surgeon: Rolm Bookbinder, MD;  Location: [place];  Service: General;  Laterality:  Right;  . THORACENTESIS     12/13/2018 atypical cells not definitely diag. of malignancy      Social History:  The patient  reports that she has never smoked. She has never used smokeless tobacco. She reports previous alcohol use. She reports that she does not use drugs.   Family History:  The patient's family history includes Asthma in her daughter; COPD in her mother; Diabetes in her brother; Heart disease in her father; Hypertension in her brother, brother, and father; Learning disabilities in her brother.    ROS:  Please see the history of present illness. All other systems are reviewed and  Negative to the above problem except as noted.    PHYSICAL EXAM: VS:  BP 115/74   Pulse 78   Ht _0  (1.651 m)   Wt 109 lb 9.6 oz (49.7 kg)   BMI 18.24 kg/m   GEN: Thin 68 yo  in no acute distress  HEENT: normal  Neck: no JVD, no carotid bruit Cardiac: RRR; no murmurs, rubs, or gallops,no edema  Respiratory:  clear to auscultation bilaterally, normal work of breathing GI: soft, nontender, nondistended, + BS  No hepatomegaly  MS: no deformity Moving all extremities   Skin: warm and dry, no rash Neuro:  Strength and sensation are intact Psych: euthymic mood, full affect   EKG:  EKG is not ordered today.   Echo  01/02/19 IMPRESSIONS    1. The left ventricle has severely reduced systolic function, with an ejection fraction of 25-30%. The cavity size was moderately dilated. Left ventricular diastolic Doppler parameters are consistent with restrictive filling.  2. Inferior septal and apical hypokinesis.  3. The right ventricle has normal systolic  function. The cavity was normal. There is no increase in right ventricular wall thickness.  4. Left atrial size was mildly dilated.  5. The mitral valve is degenerative. Moderate thickening of the mitral valve leaflet. Mild calcification of the mitral valve leaflet. There is mild mitral annular calcification present.  6. The tricuspid valve is  normal in structure.  7. The aortic valve is tricuspid Moderate thickening of the aortic valve Moderate calcification of the aortic valve.  8. The pulmonic valve was grossly normal. Pulmonic valve regurgitation is mild by color flow Doppler.  Lipid Panel    Component Value Date/Time   CHOL 241 (H) 04/26/2018 1429   TRIG 230 (H) 04/26/2018 1429   HDL 43 04/26/2018 1429   CHOLHDL 5.6 (H) 04/26/2018 1429   CHOLHDL 3.1 06/09/2016 1420   VLDL 23 06/09/2016 1420   LDLCALC 152 (H) 04/26/2018 1429      Wt Readings from Last 3 Encounters:  06/19/19 109 lb 9.6 oz (49.7 kg)  05/29/19 113 lb (51.3 kg)  12/25/18 121 lb 3.2 oz (55 kg)      ASSESSMENT AND PLAN:  1  CAD  No symptoms of angina      2 Systolic CHF Volume is pretty good   She had an echo in March   I have reviewed   May be a little less than previous, still with regional wall motion changes   I have reviewed    Keep on current regimen  REview with EP    3  HTN BP is controlled  Continue current meds   4  HL On statin and zetia   WIll check lipids     5  DM   Last A1C over 13  Check today    WIll check CBC, BMET, TSH  A1C today    Signed, Dorris Carnes, MD  06/19/2019 11:38 AM    [address], [place], [place]  [address] Phone: [phone]; Fax: [phone]

## 2019-06-19 ENCOUNTER — Other Ambulatory Visit: Payer: Self-pay

## 2019-06-19 ENCOUNTER — Telehealth: Payer: Self-pay | Admitting: Physician Assistant

## 2019-06-19 ENCOUNTER — Encounter: Payer: Self-pay | Admitting: Internal Medicine

## 2019-06-19 ENCOUNTER — Ambulatory Visit (INDEPENDENT_AMBULATORY_CARE_PROVIDER_SITE_OTHER): Payer: Medicare Other | Admitting: Internal Medicine

## 2019-06-19 VITALS — BP 115/74 | HR 78 | Ht 65.0 in | Wt 109.6 lb

## 2019-06-19 DIAGNOSIS — I5042 Chronic combined systolic (congestive) and diastolic (congestive) heart failure: Secondary | ICD-10-CM

## 2019-06-19 DIAGNOSIS — Z794 Long term (current) use of insulin: Secondary | ICD-10-CM

## 2019-06-19 DIAGNOSIS — I2510 Atherosclerotic heart disease of native coronary artery without angina pectoris: Secondary | ICD-10-CM

## 2019-06-19 DIAGNOSIS — E785 Hyperlipidemia, unspecified: Secondary | ICD-10-CM | POA: Diagnosis not present

## 2019-06-19 DIAGNOSIS — E1142 Type 2 diabetes mellitus with diabetic polyneuropathy: Secondary | ICD-10-CM

## 2019-06-19 NOTE — Telephone Encounter (Signed)
   Received critical lab page by [facility] today. Patient's CBG [id number]. I called patient at home but it rang and went to a VM that was full. DPR had husband and daughter on file to release information to. Tried husband but went immediately to VM. I tried daughter Langley Gauss and she answered. I relayed for concern over severely high CBG and advised this is a particularly dangerous level and advised the patient present to ER for further management. She will try and reach out to her mom herself and go check on her physically to relay this information. She says unfortunately her mother does not really take care of herself all the time so management has been challenging. I thanked her for helping and will route to Dr. Harrington Challenger so she is aware. The patient happened to call back on the line that I was sitting at and so I reiterated info and recommendation to patient herself. She verbalized understanding of recommendation. I advised her not to drive herself to the ER, and also asked her to update Langley Gauss that we had spoken.  Charlie Pitter

## 2019-06-19 NOTE — Patient Instructions (Signed)
Medication Instructions:  No changes If you need a refill on your cardiac medications before your next appointment, please call your pharmacy.   Lab work: Today: cbc, bmet, tsh, lipids, hgA1c If you have labs (blood work) drawn today and your tests are completely normal, you will receive your results only by: Marland Kitchen MyChart Message (if you have MyChart) OR . A paper copy in the mail If you have any lab test that is abnormal or we need to change your treatment, we will call you to review the results.  Testing/Procedures: none  Follow-Up: At [facility], you and your health needs are our priority.  As part of our continuing mission to provide you with exceptional heart care, we have created designated Provider Care Teams.  These Care Teams include your primary Cardiologist (physician) and Advanced Practice Providers (APPs -  Physician Assistants and Nurse Practitioners) who all work together to provide you with the care you need, when you need it. You will need a follow up appointment in:  6 months.  Please call our office 2 months in advance to schedule this appointment.  You may see Marisa Carnes, MD or one of the following Advanced Practice Providers on your designated Care Team: Marisa Dopp, PA-C Marisa Forbes, Marisa Forbes . Marisa Perch, NP  Any Other Special Instructions Will Be Listed Below (If Applicable).

## 2019-06-20 LAB — BASIC METABOLIC PANEL
BUN/Creatinine Ratio: 23 (ref 12–28)
BUN: 18 mg/dL (ref 8–27)
CO2: 25 mmol/L (ref 20–29)
Calcium: 9.5 mg/dL (ref 8.7–10.3)
Chloride: 88 mmol/L — ABNORMAL LOW (ref 96–106)
Creatinine, Ser: 0.77 mg/dL (ref 0.57–1.00)
GFR calc Af Amer: 92 mL/min/{1.73_m2} (ref >59)
GFR calc non Af Amer: 80 mL/min/{1.73_m2} (ref >59)
Glucose: 752 mg/dL (ref 65–99)
Potassium: 4.7 mmol/L (ref 3.5–5.2)
Sodium: 129 mmol/L — ABNORMAL LOW (ref 134–144)

## 2019-06-20 LAB — CBC
Hematocrit: 41.6 % (ref 34.0–46.6)
Hemoglobin: 13.4 g/dL (ref 11.1–15.9)
MCH: 30 pg (ref 26.6–33.0)
MCHC: 32.2 g/dL (ref 31.5–35.7)
MCV: 93 fL (ref 79–97)
Platelets: 191 10*3/uL (ref 150–450)
RBC: 4.47 x10E6/uL (ref 3.77–5.28)
RDW: 12.2 % (ref 11.7–15.4)
WBC: 3.9 10*3/uL (ref 3.4–10.8)

## 2019-06-20 LAB — LIPID PANEL
Chol/HDL Ratio: 2.3 ratio (ref 0.0–4.4)
Cholesterol, Total: 114 mg/dL (ref 100–199)
HDL: 49 mg/dL (ref >39)
LDL Calculated: 11 mg/dL (ref 0–99)
Triglycerides: 270 mg/dL — ABNORMAL HIGH (ref 0–149)
VLDL Cholesterol Cal: 54 mg/dL — ABNORMAL HIGH (ref 5–40)

## 2019-06-20 LAB — TSH: TSH: 1.34 u[IU]/mL (ref 0.450–4.500)

## 2019-06-20 LAB — HEMOGLOBIN A1C
Est. average glucose Bld gHb Est-mCnc: 378 mg/dL
Hgb A1c MFr Bld: 14.8 % — ABNORMAL HIGH (ref 4.8–5.6)

## 2019-07-12 ENCOUNTER — Other Ambulatory Visit: Payer: Self-pay | Admitting: Internal Medicine

## 2019-07-15 ENCOUNTER — Other Ambulatory Visit: Payer: Self-pay | Admitting: Internal Medicine

## 2019-07-21 ENCOUNTER — Other Ambulatory Visit: Payer: Self-pay

## 2019-07-21 ENCOUNTER — Ambulatory Visit (INDEPENDENT_AMBULATORY_CARE_PROVIDER_SITE_OTHER): Payer: Medicare Other

## 2019-07-21 ENCOUNTER — Telehealth: Payer: Self-pay

## 2019-07-21 DIAGNOSIS — Z0000 Encounter for general adult medical examination without abnormal findings: Secondary | ICD-10-CM

## 2019-07-21 DIAGNOSIS — Z1159 Encounter for screening for other viral diseases: Secondary | ICD-10-CM

## 2019-07-21 NOTE — Progress Notes (Signed)
Subjective:   Marisa Forbes is a 68 y.o. female who presents for an Initial Medicare Annual Wellness Visit.  Review of Systems    No ROS.  Medicare Wellness Virtual Visit.  Visual/audio telehealth visit, UTA vital signs.   See social history for additional risk factors.    Cardiac Risk Factors include: advanced age (>24mn, >>86women);diabetes mellitus     Objective:    Today's Vitals   There is no height or weight on file to calculate BMI.  Advanced Directives 07/21/2019 05/29/2019 12/11/2018 12/10/2018 12/22/2016 12/22/2016 07/29/2015  Does Patient Have a Medical Advance Directive? Yes _0  No  Type of [place] will - - - - - -  Does patient want to make changes to medical advance directive? No - Patient declined - - - - - -  Copy of [place] Chart? Yes - validated most recent copy scanned in chart (See row information) - - - - - -  Would patient like information on creating a medical advance directive? - No - Patient declined - No - Patient declined No - Patient declined - No - patient declined information  Pre-existing out of facility DNR order (yellow form or pink MOST form) - - - - - - -    Current Medications (verified) Outpatient Encounter Medications as of 07/21/2019  Medication Sig  . aspirin 81 MG EC tablet Take 1 tablet (81 mg total) by mouth daily.  .Marland Kitchenatorvastatin (LIPITOR) 80 MG tablet TAKE 1 TABLET BY MOUTH DAILY.  .Marland Kitchenazithromycin (ZITHROMAX) 250 MG tablet Take 2 pills the first day and 1 pill each day on days 2 through 5.  . carvedilol (COREG) 25 MG tablet TAKE 1 TABLET BY MOUTH TWICE A DAY WITH MEALS  . ENTRESTO 49-51 MG TAKE 1 TABLET BY MOUTH 2 (TWO) TIMES DAILY. PLEASE KEEP UPCOMING APPT IN AUGUST WITH DR. ROSS FOR FUTURE REFILLS. THANK YOU  . ezetimibe (ZETIA) 10 MG tablet TAKE 1 TABLET BY MOUTH EVERY DAY  . feeding supplement, ENSURE ENLIVE, (ENSURE ENLIVE) LIQD Take 237 mLs by mouth 2  (two) times daily as needed (If PO intakes of meals are poor).  . furosemide (LASIX) 40 MG tablet TAKE 1 TABLET BY MOUTH DAILY. PLEASE KEEP UPCOMING APPT IN AUGUST WITH DR. ROSS FOR FUTURE REFILLS. THANK YOU  . glucose blood (ONETOUCH VERIO) test strip Use TID  . hydrALAZINE (APRESOLINE) 10 MG tablet Take 1 tablet (10 mg total) by mouth 2 (two) times daily as needed. For BP >130/>80  . Insulin Glargine (LANTUS SOLOSTAR) 100 UNIT/ML Solostar Pen Inject 18 units [place] at bedtime.  . insulin lispro (HUMALOG) 100 UNIT/ML injection Inject 0.1-0.12 mLs (10-12 Units total) into the skin every morning.  . isosorbide mononitrate (IMDUR) 60 MG 24 hr tablet TAKE 1 TABLET BY MOUTH DAILY.  . nitroGLYCERIN (NITROSTAT) 0.4 MG SL tablet Place 1 tablet (0.4 mg total) under the tongue every 5 (five) minutes as needed for chest pain.  . polyethylene glycol (MIRALAX / GLYCOLAX) packet Take 17 g by mouth daily.  .Marland Kitchensenna (SENOKOT) 8.6 MG TABS tablet Take 2 tablets (17.2 mg total) by mouth at bedtime.  .Marland Kitchenspironolactone (ALDACTONE) 25 MG tablet Take 1 tablet (25 mg total) by mouth daily.   No facility-administered encounter medications on file as of 07/21/2019.     Allergies (verified) Patient has no known allergies.   History: Past Medical History:  Diagnosis Date  .  Arthritis    back  . Balance problems    due to stroke  . Benign head tremor    per pt due to muscle spasm in neck   . Breast cancer ([place]) 12/03/12   a. 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 S/p chemo with CMF and radiation.  . Cataracts, bilateral   . CHF (congestive heart failure) ([place])    On Monday she said her cardiologist was concerned for possible CHF, but is now improving-per pt.(07/26/15)  . CHF (congestive heart failure) ([place])   . Coronary artery disease    a. STEMI 08/2012: s/p DES to LAD, PTCA to downstream LAD. b. Relook cath same hospitalization: stable post-PCI anatomy with Stable Diag lesions (unlikely  cause of resting angina, not good PCI targets), stable RCA lesion (non flow limiting).  . Diabetes mellitus without complication ([place])    Takes insulin and metformin  . Dysplasia of cervix, low grade (CIN 1)    in 02 s/p LEEP   . GERD (gastroesophageal reflux disease)    food related  . History of cancer chemotherapy    last in August 2014  . 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 History of radiation therapy 06/23/2013-08/08/2013   62.4 gray to left breast  . HTN (hypertension)   . Hypercholesteremia   . Insomnia   . Left kidney mass   . LV dysfunction    a. EF 45-50% at time of STEMI 08/2012. b. Echo 04/2013: EF 45-50%, mild focal basal hypertrophy of septum, grade 1 d/d.  Marland Kitchen Myocardial infarction ([place]) 09/07/12  . Peripheral vision loss    left  . Perirectal abscess    11/2018   . Pleural effusion, bilateral   . Pneumonia   . Pneumonia   . Renal artery stenosis ([facility])    a. 20% L RAS in 08/2012 by cath. b. Duplex 07/2012: >60% L RAS.  . Stroke, embolic ([place])    a. 31/5176 post cath. with residual reduced vision in left eye; noted right occiptal lobe/PCA infarct   . UTI (urinary tract infection)   . UTI (urinary tract infection)   . Vasovagal syncope    a. 04/2013 - echo stable compared to prior EF 45-50%, negative carotid duplex.   Past Surgical History:  Procedure Laterality Date  . BACK SURGERY     x3, lower back  . BREAST BIOPSY Left 12/03/2012   U/S Core- Malignant  . BREAST LUMPECTOMY Left 01/08/2013  . BREAST LUMPECTOMY WITH NEEDLE LOCALIZATION AND AXILLARY SENTINEL LYMPH NODE BX Left 01/08/2013   Procedure: LEFT BREAST WIRE GUIDED  LUMPECTOMY AND LEFT AXILLARY SENTINEL  NODE BX;  Surgeon: Rolm Bookbinder, MD;  Location: [place] [facility];  Service: General;  Laterality: Left;  . CATARACT EXTRACTION  Bilateral   . CERVICAL BIOPSY  W/ LOOP ELECTRODE EXCISION     02/2001  . CORONARY ANGIOPLASTY WITH STENT PLACEMENT    . EYE SURGERY     b/l cataract   . INCISION AND DRAINAGE PERIRECTAL ABSCESS N/A 12/11/2018   Procedure: IRRIGATION AND DEBRIDEMENT PERIRECTAL ABSCESS;  Surgeon: Johnathan Hausen, MD;  Location: [facility];  Service: General;  Laterality: N/A;  . LEFT HEART CATHETERIZATION WITH CORONARY ANGIOGRAM N/A 09/07/2012   Procedure: LEFT HEART CATHETERIZATION WITH CORONARY ANGIOGRAM;  Surgeon: Troy Sine, MD;  Location: [facility] CATH LAB;  Service:  Cardiovascular;  Laterality: N/A;  . LEFT HEART CATHETERIZATION WITH CORONARY ANGIOGRAM  09/09/2012   Procedure: LEFT HEART CATHETERIZATION WITH CORONARY ANGIOGRAM;  Surgeon: Leonie Man, MD;  Location: [facility] CATH LAB;  Service: Cardiovascular;;  . PERCUTANEOUS CORONARY STENT INTERVENTION (PCI-S) N/A 09/07/2012   Procedure: PERCUTANEOUS CORONARY STENT INTERVENTION (PCI-S);  Surgeon: Troy Sine, MD;  Location: [facility] C[facility] LAB;  Service: Cardiovascular;  Laterality: N/A;  . PORT-A-CATH REMOVAL N/A 10/14/2013   Procedure: REMOVAL PORT-A-CATH;  Surgeon: Rolm Bookbinder, MD;  Location: [facility];  Service: General;  Laterality: N/A;  . PORTACATH PLACEMENT Right 02/17/2013   Procedure: INSERTION PORT-A-CATH;  Surgeon: Rolm Bookbinder, MD;  Location: [place];  Service: General;  Laterality: Right;  . THORACENTESIS     12/13/2018 atypical cells not definitely diag. of malignancy    Family History  Problem Relation Age of Onset  . COPD Mother   . Heart disease Father   . Hypertension Father   . Diabetes Brother   . Hypertension Brother   . Learning disabilities Brother   . Hypertension Brother   . Asthma Daughter   . Cancer Neg Hx   . Hyperlipidemia Neg Hx   . Kidney disease Neg Hx   . Stroke Neg Hx    Social History   Socioeconomic History  . Marital status: Married    Spouse name: Marisa Forbes  . Number of children: 2  . Years of education: college   . Highest education level: Not on file  Occupational History    Employer: [employer]  . Financial resource strain: Not on file  . Food insecurity    Worry: Not on file    Inability: Not on file  . Transportation needs    Medical: Not on file    Non-medical: Not on file  Tobacco Use  . Smoking status: Never Smoker  . Smokeless tobacco: Never Used  Substance and Sexual Activity  . Alcohol use: Not Currently    Comment: socially  . Drug use: No  . Sexual activity: Yes  Lifestyle  . Physical activity    Days per week: Not on file    Minutes per session: Not on file  . Stress: Not on file  Relationships  . Social Herbalist on phone: Not on file    Gets together: Not on file    Attends religious service: Not on file    Active member of club or organization: Not on file    Attends meetings of clubs or organizations: Not on file    Relationship status: Not on file  Other Topics Concern  . Not on file  Social History Narrative   Married   Retired    Former Optometrist    Patient has 2 children and some college education.    Owns guns, wears seat belt, safe in relationship     Tobacco Counseling Counseling given: Not Answered   Clinical Intake:  Pre-visit preparation completed: Yes        Diabetes: Yes(Followed by pcp)  How often do you need to have someone help you when you read instructions, pamphlets, or other written materials from your doctor or pharmacy?: 1 - Never  Interpreter Needed?: No      Activities of Daily Living In your present state of health, do you have any difficulty performing the following activities: 07/21/2019 12/11/2018  Hearing? N N  Vision? N N  Difficulty concentrating or making decisions? N N  Walking or climbing stairs? Y Y  Comment Unsteady gait. Cane in use. -  Dressing or bathing? N Y  Doing errands, shopping? N Y  Conservation officer, nature and eating ? N -  Using the Toilet? N -  In the past  six months, have you accidently leaked urine? N -  Do you have problems with loss of bowel control? N -  Managing your Medications? N -  Managing your Finances? N -  Housekeeping or managing your Housekeeping? N -  Some recent data might be hidden     Immunizations and Health Maintenance Immunization History  Administered Date(s) Administered  . DTaP 08/07/2007  . Influenza Split 09/08/2012  . Influenza, High Dose Seasonal PF 08/20/2017, 12/25/2018  . Influenza,inj,Quad PF,6+ Mos 11/14/2013  . Pneumococcal Conjugate-13 11/14/2013  . Pneumococcal Polysaccharide-23 10/03/2012  . Tdap 06/01/2015  . Zoster 11/14/2013   Health Maintenance Due  Topic Date Due  . Hepatitis C Screening  October 18, 1951  . FOOT EXAM  05/31/2016  . PNA vac Low Risk Adult (2 of 2 - PPSV23) 10/03/2017    Patient Care Team: McLean-Scocuzza, Nino Glow, MD as PCP - General (Internal Medicine) Fay Records, MD as PCP - Cardiology (Cardiology)  Indicate any recent Medical Services you may have received from other than [facility] providers in the past year (date may be approximate).     Assessment:   This is a routine wellness examination for [place].  I connected with patient 07/21/19 at  8:30 AM EDT by an audio enabled telemedicine application and verified that I am speaking with the correct person using two identifiers. Patient stated full name and DOB. Patient gave permission to continue with virtual visit. Patient's location was at home and Nurse's location was at [place] office.   Health Maintenance Due: -Influenza vaccine 2020- discussed; to be completed in season with doctor or local pharmacy.   -PNA - discussed; to be completed with doctor in visit or local pharmacy.   -Colonoscopy- declined. She plans to follow up with her doctor. Discussed cologuard.  -Foot Exam- followed by pcp -Hgb A1c- 06/19/19 (14.8)  Update all pending maintenance due as appropriate.   See completed HM at the end of note.   Eye:  Visual acuity not assessed. Virtual visit. Wears corrective lenses. Followed by their ophthalmologist every 12 months.  Retinopathy- none reported.  Dental: Visits every 6 months.    Hearing: Demonstrates normal hearing during visit.  Safety:  Patient feels safe at home- yes Patient does have smoke detectors at home- yes Patient does wear sunscreen or protective clothing when in direct sunlight - yes Patient does wear seat belt when in a moving vehicle - yes Adequate lighting in walkways free from debris- yes Grab bars and handrails used as appropriate- yes Ambulates with an assistive device- cane Cell phone on person when ambulating outside of the home- yes  Social: Alcohol intake - yes, social      Smoking history- never  Smokers in home? none Illicit drug use? none  Depression: PHQ 2 &9 complete. See screening below. Denies irritability, anhedonia, sadness/tearfullness.  Stable.   Falls: See screening below.    Medication: Taking as directed and without issues.   Covid-19: Precautions and sickness symptoms discussed. Wears mask, social distancing, hand hygiene as appropriate.   Activities of Daily Living Patient denies needing assistance with: household chores, feeding themselves, getting from bed to chair, getting to the toilet, bathing/showering, dressing, managing money, or preparing meals.   Memory: Patient is  alert. Patient denies difficulty focusing or concentrating. Correctly identified the president of the [place], season and recall. Patient likes to read for brain stimulation.  BMI- discussed the importance of a healthy diet, water intake and the benefits of aerobic exercise.  Educational material provided.  Physical activity- no routine.   Diet:  Regular Water: good intake Ensure/Protein supplement: yes, 1 Enlive daily  Advanced Directive: End of life planning; Advance aging; Advanced directives discussed.  Copy of current HCPOA/Living Will on file.     Other Providers Patient Care Team: McLean-Scocuzza, Nino Glow, MD as PCP - General (Internal Medicine) Fay Records, MD as PCP - Cardiology (Cardiology)  Hearing/Vision screen  Hearing Screening   _0  _1  _2  _3  _4  _5  _6  _7  _8   Right ear:           Left ear:           Comments: Patient is able to hear conversational tones without difficulty.  No issues reported.  Vision Screening Comments: Wears corrective lenses Visual acuity not assessed, virtual visit.  They have seen their ophthalmologist in the last 12 months.    Dietary issues and exercise activities discussed: Current Exercise Habits: The patient does not participate in regular exercise at present  Goals    . DIET - INCREASE LEAN PROTEINS     Low carb diet    . Increase physical activity     Stay active Walk as tolerated      Depression Screen PHQ 2/9 Scores 07/21/2019  PHQ - 2 Score 0    Fall Risk Fall Risk  07/21/2019  Falls in the past year? 0   Timed Get Up and Go Performed: no, virtual visit  Cognitive Function:     6CIT Screen 07/21/2019  What Year? 0 points  What month? 0 points  What time? 0 points  Count back from 20 0 points  Months in reverse 0 points  Repeat phrase 0 points  Total Score 0    Screening Tests Health Maintenance  Topic Date Due  . Hepatitis C Screening  Feb 04, 1951  . FOOT EXAM  05/31/2016  . PNA vac Low Risk Adult (2 of 2 - PPSV23) 10/03/2017  . INFLUENZA VACCINE  01/28/2020 (Originally 05/31/2019)  . COLONOSCOPY  07/20/2020 (Originally 12/13/2000)  . HEMOGLOBIN A1C  12/20/2019  . OPHTHALMOLOGY EXAM  03/04/2020  . MAMMOGRAM  04/24/2021  . TETANUS/TDAP  05/31/2025  . DEXA SCAN  Completed     Plan:   Keep all routine maintenance appointments.   Schedule diabetic follow up with your doctor.  Medicare Attestation I have personally reviewed: The patient's medical and social history Their use of alcohol, tobacco or illicit drugs Their  current medications and supplements The patient's functional ability including ADLs,fall risks, home safety risks, cognitive, and hearing and visual impairment Diet and physical activities Evidence for depression   In addition, I have reviewed and discussed with patient certain preventive protocols, quality metrics, and best practice recommendations. A written personalized care plan for preventive services as well as general preventive health recommendations were provided to patient via mail.     Varney Biles, LPN   01/21/4981

## 2019-07-21 NOTE — Telephone Encounter (Signed)
Phone call follow up after AWV. Patient requested sliding scale per her doctor's recommendation to better adhere with taking insulin as directed.  Deferred to pcp for follow up and scale provided below.  Sent to patient via mail.   If blood sugar 131-180 give 4 units short acting insulin   If 181-240 8 units  If 241-300 10 units  If 301-350 12 units  If 351-400 16 units  If >400 20 units and call doctor

## 2019-07-21 NOTE — Patient Instructions (Addendum)
  Marisa Forbes , Thank you for taking time to come for your Medicare Wellness Visit. I appreciate your ongoing commitment to your health goals. Please review the following plan we discussed and let me know if I can assist you in the future.   These are the goals we discussed: Goals    . DIET - INCREASE LEAN PROTEINS     Low carb diet    . Increase physical activity     Stay active Walk as tolerated       This is a list of the screening recommended for you and due dates:  Health Maintenance  Topic Date Due  .  Hepatitis C: One time screening is recommended by Center for Disease Control  (CDC) for  adults born from [birth year] through 1965.   Dec 06, 1950  . Complete foot exam   05/31/2016  . Pneumonia vaccines (2 of 2 - PPSV23) 10/03/2017  . Flu Shot  01/28/2020*  . Colon Cancer Screening  07/20/2020*  . Hemoglobin A1C  12/20/2019  . Eye exam for diabetics  03/04/2020  . Mammogram  04/24/2021  . Tetanus Vaccine  05/31/2025  . DEXA scan (bone density measurement)  Completed  *Topic was postponed. The date shown is not the original due date.

## 2019-07-29 ENCOUNTER — Other Ambulatory Visit: Payer: Self-pay | Admitting: Internal Medicine

## 2019-09-18 DIAGNOSIS — Z23 Encounter for immunization: Secondary | ICD-10-CM | POA: Diagnosis not present

## 2019-09-30 ENCOUNTER — Other Ambulatory Visit: Payer: Self-pay

## 2019-09-30 ENCOUNTER — Telehealth (INDEPENDENT_AMBULATORY_CARE_PROVIDER_SITE_OTHER): Payer: Medicare Other | Admitting: Internal Medicine

## 2019-09-30 ENCOUNTER — Other Ambulatory Visit (INDEPENDENT_AMBULATORY_CARE_PROVIDER_SITE_OTHER): Payer: Medicare Other

## 2019-09-30 ENCOUNTER — Encounter: Payer: Self-pay | Admitting: Internal Medicine

## 2019-09-30 ENCOUNTER — Other Ambulatory Visit: Payer: Self-pay | Admitting: Internal Medicine

## 2019-09-30 ENCOUNTER — Telehealth: Payer: Self-pay | Admitting: Internal Medicine

## 2019-09-30 DIAGNOSIS — R300 Dysuria: Secondary | ICD-10-CM | POA: Diagnosis not present

## 2019-09-30 DIAGNOSIS — E1165 Type 2 diabetes mellitus with hyperglycemia: Secondary | ICD-10-CM

## 2019-09-30 DIAGNOSIS — Z794 Long term (current) use of insulin: Secondary | ICD-10-CM

## 2019-09-30 DIAGNOSIS — N3001 Acute cystitis with hematuria: Secondary | ICD-10-CM

## 2019-09-30 DIAGNOSIS — N3000 Acute cystitis without hematuria: Secondary | ICD-10-CM

## 2019-09-30 MED ORDER — CIPROFLOXACIN HCL 500 MG PO TABS: 500.0000 mg | ORAL_TABLET | Freq: Two times a day (BID) | ORAL | 0 refills | Status: DC

## 2019-09-30 MED ORDER — PHENAZOPYRIDINE HCL 200 MG PO TABS: 200.0000 mg | ORAL_TABLET | Freq: Three times a day (TID) | ORAL | 0 refills | Status: DC | PRN

## 2019-09-30 NOTE — Telephone Encounter (Signed)
Telephone Note  I connected with Marisa Forbes  on 09/30/19 at 6:20  pm telephone application and verified that I am speaking with the correct person using two identifiers.  Location patient: home Location provider:work or home office Persons participating in the virtual visit: patient, provider  I discussed the limitations of evaluation and management by telemedicine and the availability of in person appointments. The patient expressed understanding and agreed to proceed.   HPI: 1. UTI sx's x 2 weeks with burning with urination, no fever, increased frequency, trouble with emptying bladder and pink tint to urine with mild abdominal pain/pressure   2. DM 2 a1C uncontrolled 14.8 in 06/19/19 and uncontrolled since 2014 pt agreeable to endocrine referral in [place] she reports problems with diet and med compliance  On lantus 18 units and humalog 12 u qd to bid  She has financial issues which is barrier to DM control  ROS: See pertinent positives and negatives per HPI.  Past Medical History:  Diagnosis Date  . Arthritis    back  . Balance problems    due to stroke  . Benign head tremor    per pt due to muscle spasm in neck   . Breast cancer ([place]) 12/03/12   a. 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 S/p chemo with CMF and radiation.  . Cataracts, bilateral   . CHF (congestive heart failure) ([place])    On Monday she said her cardiologist was concerned for possible CHF, but is now improving-per pt.(07/26/15)  . CHF (congestive heart failure) ([place])   . Coronary artery disease    a. STEMI 08/2012: s/p DES to LAD, PTCA to downstream LAD. b. Relook cath same hospitalization: stable post-PCI anatomy with Stable Diag lesions (unlikely cause of resting angina, not good PCI targets), stable RCA lesion (non flow limiting).  . Diabetes mellitus without complication ([place])    Takes insulin and metformin  . Dysplasia of cervix, low grade (CIN 1)    in 02 s/p LEEP   . GERD  (gastroesophageal reflux disease)    food related  . History of cancer chemotherapy    last in August 2014  . 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 History of radiation therapy 06/23/2013-08/08/2013   62.4 gray to left breast  . HTN (hypertension)   . Hypercholesteremia   . Insomnia   . Left kidney mass   . LV dysfunction    a. EF 45-50% at time of STEMI 08/2012. b. Echo 04/2013: EF 45-50%, mild focal basal hypertrophy of septum, grade 1 d/d.  Marland Kitchen Myocardial infarction ([place]) 09/07/12  . Peripheral vision loss    left  . Perirectal abscess    11/2018   . Pleural effusion, bilateral   . Pneumonia   . Pneumonia   . Renal artery stenosis ([facility])    a. 20% L RAS in 08/2012 by cath. b. Duplex 07/2012: >60% L RAS.  . Stroke, embolic ([place])    a. 09/2481 post cath. with residual reduced vision in left eye; noted right occiptal lobe/PCA infarct   . UTI (urinary tract infection)   . UTI (urinary tract infection)   . Vasovagal syncope    a. 04/2013 - echo stable compared to prior EF 45-50%, negative carotid duplex.    Past Surgical History:  Procedure Laterality Date  . BACK SURGERY  x3, lower back  . BREAST BIOPSY Left 12/03/2012   U/S Core- Malignant  . BREAST LUMPECTOMY Left 01/08/2013  . BREAST LUMPECTOMY WITH NEEDLE LOCALIZATION AND AXILLARY SENTINEL LYMPH NODE BX Left 01/08/2013   Procedure: LEFT BREAST WIRE GUIDED  LUMPECTOMY AND LEFT AXILLARY SENTINEL  NODE BX;  Surgeon: Rolm Bookbinder, MD;  Location: [facility];  Service: General;  Laterality: Left;  . CATARACT EXTRACTION Bilateral   . CERVICAL BIOPSY  W/ LOOP ELECTRODE EXCISION     02/2001  . CORONARY ANGIOPLASTY WITH STENT PLACEMENT    . EYE SURGERY     b/l cataract   . INCISION AND DRAINAGE PERIRECTAL ABSCESS N/A 12/11/2018   Procedure: IRRIGATION AND  DEBRIDEMENT PERIRECTAL ABSCESS;  Surgeon: Johnathan Hausen, MD;  Location: [facility];  Service: General;  Laterality: N/A;  . LEFT HEART CATHETERIZATION WITH CORONARY ANGIOGRAM N/A 09/07/2012   Procedure: LEFT HEART CATHETERIZATION WITH CORONARY ANGIOGRAM;  Surgeon: Troy Sine, MD;  Location[facility] CATH LAB;  Service: Cardiovascular;  Laterality: N/A;  . LEFT HEART CATHETERIZATION WITH CORONARY ANGIOGRAM  09/09/2012   Procedure: LEFT HEART CATHETERIZATION WITH CORONARY ANGIOGRAM;  Surgeon: Leonie Man, MD;  Location[facility] CATH LAB;  Service: Cardiovascular;;  . PERCUTANEOUS CORONARY STENT INTERVENTION (PCI-S) N/A 09/07/2012   Procedure: PERCUTANEOUS CORONARY STENT INTERVENTION (PCI-S);  Surgeon: Troy Sine, MD;  Location: [facility] CATH LAB;  Service: Cardiovascular;  Laterality: N/A;  . PORT-A-CATH REMOVAL N/A 10/14/2013   Procedure: REMOVAL PORT-A-CATH;  Surgeon: Rolm Bookbinder, MD;  Location: [facility];  Service: General;  Laterality: N/A;  . PORTACATH PLACEMENT Right 02/17/2013   Procedure: INSERTION PORT-A-CATH;  Surgeon: Rolm Bookbinder, MD;  Location: [facility];  Service: General;  Laterality: Right;  . THORACENTESIS     12/13/2018 atypical cells not definitely diag. of malignancy     Family History  Problem Relation Age of Onset  . COPD Mother   . Heart disease Father   . Hypertension Father   . Diabetes Brother   . Hypertension Brother   . Learning disabilities Brother   . Hypertension Brother   . Asthma Daughter   . Cancer Neg Hx   . Hyperlipidemia Neg Hx   . Kidney disease Neg Hx   . Stroke Neg Hx     SOCIAL HX:  Lives with husband   Current Outpatient Medications:  .  aspirin 81 MG EC tablet, Take 1 tablet (81 mg total) by mouth daily., Disp: 30 tablet, Rfl: 12 .  atorvastatin (LIPITOR) 80 MG tablet, TAKE 1 TABLET BY MOUTH DAILY., Disp: 90 tablet, Rfl: 3 .  carvedilol (COREG) 25 MG tablet, TAKE 1 TABLET BY MOUTH TWICE A DAY WITH MEALS, Disp: 180 tablet, Rfl: 3 .   ciprofloxacin (CIPRO) 500 MG tablet, Take 1 tablet (500 mg total) by mouth 2 (two) times daily. w food, Disp: 10 tablet, Rfl: 0 .  ENTRESTO 49-51 MG, TAKE 1 TABLET BY MOUTH 2 (TWO) TIMES DAILY. PLEASE KEEP UPCOMING APPT IN AUGUST WITH DR. ROSS FOR FUTURE REFILLS. THANK YOU, Disp: 180 tablet, Rfl: 3 .  ezetimibe (ZETIA) 10 MG tablet, TAKE 1 TABLET BY MOUTH EVERY DAY, Disp: 90 tablet, Rfl: 3 .  feeding supplement, ENSURE ENLIVE, (ENSURE ENLIVE) LIQD, Take 237 mLs by mouth 2 (two) times daily as needed (If PO intakes of meals are poor)., Disp: 7 Bottle, Rfl: 12 .  furosemide (LASIX) 40 MG tablet, TAKE 1 TABLET BY MOUTH DAILY. PLEASE KEEP UPCOMING APPT IN AUGUST WITH DR. ROSS FOR FUTURE REFILLS.  THANK YOU, Disp: 90 tablet, Rfl: 3 .  glucose blood (ONETOUCH VERIO) test strip, Use TID, Disp: 100 each, Rfl: 12 .  hydrALAZINE (APRESOLINE) 10 MG tablet, Take 1 tablet (10 mg total) by mouth 2 (two) times daily as needed. For BP >130/>80, Disp: 90 tablet, Rfl: 2 .  Insulin Glargine (LANTUS SOLOSTAR) 100 UNIT/ML Solostar Pen, Inject 18 units [place] at bedtime., Disp: 5 pen, Rfl: 0 .  insulin lispro (HUMALOG) 100 UNIT/ML injection, Inject 0.1-0.12 mLs (10-12 Units total) into the skin every morning., Disp: 10 mL, Rfl: 0 .  isosorbide mononitrate (IMDUR) 60 MG 24 hr tablet, TAKE 1 TABLET BY MOUTH DAILY., Disp: 90 tablet, Rfl: 3 .  nitroGLYCERIN (NITROSTAT) 0.4 MG SL tablet, Place 1 tablet (0.4 mg total) under the tongue every 5 (five) minutes as needed for chest pain., Disp: 25 tablet, Rfl: 11 .  phenazopyridine (PYRIDIUM) 200 MG tablet, Take 1 tablet (200 mg total) by mouth 3 (three) times daily as needed for pain., Disp: 6 tablet, Rfl: 0 .  polyethylene glycol (MIRALAX / GLYCOLAX) packet, Take 17 g by mouth daily., Disp: 14 each, Rfl: 0 .  senna (SENOKOT) 8.6 MG TABS tablet, Take 2 tablets (17.2 mg total) by mouth at bedtime., Disp: 60 each, Rfl: 0 .  spironolactone (ALDACTONE) 25 MG tablet, TAKE 1 TABLET BY MOUTH  EVERY DAY, Disp: 90 tablet, Rfl: 1  EXAM:  VITALS per patient if applicable:  GENERAL: alert, oriented, appears well and in no acute distress  PSYCH/NEURO: pleasant and cooperative, no obvious depression or anxiety, speech and thought processing grossly intact  ASSESSMENT AND PLAN:  Discussed the following assessment and plan:  Acute cystitis with hematuria UA and culture cipro and pyridum  rtc if not better   Type 2 diabetes mellitus with hyperglycemia, with long-term current use of insulin ([place]) - Plan: Ambulatory referral to Endocrinology lantus 18 units likely needs to be titrated up  humalog 12 u qd to bid  Cost is barrier to DM control may need pt asst. Program   -we discussed possible serious and likely etiologies, options for evaluation and workup, limitations of telemedicine visit vs in person visit, treatment, treatment risks and precautions. Pt prefers to treat via telemedicine empirically rather then risking or undertaking an in person visit at this moment. Patient agrees to seek prompt in person care if worsening, new symptoms arise, or if is not improving with treatment.   I discussed the assessment and treatment plan with the patient. The patient was provided an opportunity to ask questions and all were answered. The patient agreed with the plan and demonstrated an understanding of the instructions.   The patient was advised to call back or seek an in-person evaluation if the symptoms worsen or if the condition fails to improve as anticipated.  Time spent 15 minutes  Delorise Jackson, MD

## 2019-09-30 NOTE — Telephone Encounter (Signed)
Patient agreed to visit through my chart. Will come in for UA and culture this afternoon at 2, having burning and urgency X 24 hours. Orders have been placed.

## 2019-09-30 NOTE — Telephone Encounter (Signed)
See telephone note from 09/30/2019  Pt agreeable to tx via telephone or my chart  Called pt due to delay in my chart   [place]

## 2019-09-30 NOTE — Telephone Encounter (Signed)
Patient called and would like for Dr. Aundra Dubin to call her something in for UTI to her pharmacy if possible and if needs to talk to her to give her a call back, thanks.

## 2019-10-01 LAB — URINALYSIS, ROUTINE W REFLEX MICROSCOPIC
Bilirubin Urine: NEGATIVE
Ketones, ur: NEGATIVE
Leukocytes,Ua: NEGATIVE
Nitrite: NEGATIVE
Specific Gravity, Urine: 1.02 (ref 1.000–1.030)
Total Protein, Urine: 100 — AB
Urine Glucose: >=1000 — AB
Urobilinogen, UA: 0.2 (ref 0.0–1.0)
pH: 5.5 (ref 5.0–8.0)

## 2019-10-02 LAB — URINE CULTURE
MICRO NUMBER:: 1150912
SPECIMEN QUALITY:: ADEQUATE

## 2019-11-04 ENCOUNTER — Encounter: Payer: Self-pay | Admitting: Internal Medicine

## 2019-11-04 ENCOUNTER — Ambulatory Visit (INDEPENDENT_AMBULATORY_CARE_PROVIDER_SITE_OTHER): Payer: Medicare Other | Admitting: Internal Medicine

## 2019-11-04 ENCOUNTER — Other Ambulatory Visit: Payer: Self-pay

## 2019-11-04 VITALS — BP 140/82 | HR 80 | Ht 65.0 in | Wt 115.0 lb

## 2019-11-04 DIAGNOSIS — E1165 Type 2 diabetes mellitus with hyperglycemia: Secondary | ICD-10-CM | POA: Diagnosis not present

## 2019-11-04 DIAGNOSIS — E1159 Type 2 diabetes mellitus with other circulatory complications: Secondary | ICD-10-CM

## 2019-11-04 LAB — POCT GLYCOSYLATED HEMOGLOBIN (HGB A1C): Hemoglobin A1C: 14.1 % — AB (ref 4.0–5.6)

## 2019-11-04 MED ORDER — INSULIN NPH (HUMAN) (ISOPHANE) 100 UNIT/ML ~~LOC~~ SUSP: 15.0000 [IU] | Freq: Two times a day (BID) | SUBCUTANEOUS | 11 refills | Status: DC

## 2019-11-04 MED ORDER — INSULIN REGULAR HUMAN 100 UNIT/ML IJ SOLN: 10.0000 [IU] | Freq: Three times a day (TID) | INTRAMUSCULAR | 11 refills | Status: AC

## 2019-11-04 NOTE — Addendum Note (Signed)
Addended by: Cardell Peach I on: 11/04/2019 02:18 PM   Modules accepted: Orders

## 2019-11-04 NOTE — Progress Notes (Signed)
Patient ID: Marisa Forbes, female   DOB: 1950-12-10, 69 y.o.   MRN: [id number]   This visit occurred during the SARS-CoV-2 public health emergency.  Safety protocols were in place, including screening questions prior to the visit, additional usage of staff PPE, and extensive cleaning of exam room while observing appropriate contact time as indicated for disinfecting solutions.   HPI: Marisa Forbes is a 69 y.o.-year-old female, referred by her PCP, Dr. Terese Door, for management of DM2, dx in 08/2012, insulin-dependent since dx, uncontrolled, with long-term complications (CHF, CAD, history of stroke, peripheral neuropathy).  Reviewed her HbA1c levels: Lab Results  Component Value Date   HGBA1C 14.8 (H) 06/19/2019   HGBA1C 13.8 (H) 12/11/2018   HGBA1C >15.5 (H) 12/23/2016   HGBA1C 10.8 (H) 06/01/2015   HGBA1C 13.2 (H) 04/12/2015   HGBA1C 7.1 (H) 06/26/2013   HGBA1C 13.6 (H) 09/07/2012   Pt was on a regimen of: - Lantus 18 units at bedtime - Humalog 10-12 units 3x a day, before meals But these werexpensive.  Now on: - R insulin sliding scale - 1-20 units - uses this only for correction, not for meal coverage  Pt checks her sugars 2-3x a day and they are: - am: 200-300 - 2h after b'fast: n/c - before lunch: n/c - 2h after lunch: 200-300s - before dinner: n/c - 2h after dinner: n/c - bedtime: 200-500 - nighttime: n/c Lowest sugar was 110; she has hypoglycemia awareness at 70s.  Highest sugar was 500s.  Glucometer: ReliOn  Pt's meals are: - Breakfast: oatmeal - Lunch: sandwich - Dinner: meat, veggies - Snacks: 2 - sugar free cookies, applesauce  - no CKD, last BUN/creatinine:  Lab Results  Component Value Date   BUN 18 06/19/2019   BUN 25 (H) 05/29/2019   CREATININE 0.77 06/19/2019   CREATININE 0.65 05/29/2019  On Entresto - too expensive: 400$ - off now.  - + HL;last set of lipids: Lab Results  Component Value Date   CHOL 114 06/19/2019   HDL 49 06/19/2019    LDLCALC 11 06/19/2019   TRIG 270 (H) 06/19/2019   CHOLHDL 2.3 06/19/2019  On Lipitor 80, Zetia 10.  - last eye exam was in 2020. No DR.   On ASA 81.  - no numbness and tingling in her feet.  Pt has no FH of DM.  She also has a history of HTN, history of breast cancer.  ROS: Constitutional: no weight gain, no weight loss, + fatigue, no subjective hyperthermia, no subjective hypothermia, + nocturia, + poor sleep Eyes: no blurry vision, no xerophthalmia ENT: no sore throat, no nodules palpated in neck, no dysphagia, no odynophagia, no hoarseness, no tinnitus, no hypoacusis Cardiovascular: no CP, no SOB, no palpitations, + leg swelling Respiratory: no cough, no SOB, no wheezing Gastrointestinal: no N, no V, no D, + C, no acid reflux Musculoskeletal: no muscle, no joint aches Skin: no rash, no hair loss Neurological: no tremors, no numbness or tingling/no dizziness/no HAs Psychiatric: no depression, no anxiety  Past Medical History:  Diagnosis Date  . Arthritis    back  . Balance problems    due to stroke  . Benign head tremor    per pt due to muscle spasm in neck   . Breast cancer ([place]) 12/03/12   a. 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 S/p chemo with CMF and radiation.  . Cataracts, bilateral   . CHF (congestive heart failure) ([place])    On  Monday she said her cardiologist was concerned for possible CHF, but is now improving-per pt.(07/26/15)  . CHF (congestive heart failure) ([place])   . Coronary artery disease    a. STEMI 08/2012: s/p DES to LAD, PTCA to downstream LAD. b. Relook cath same hospitalization: stable post-PCI anatomy with Stable Diag lesions (unlikely cause of resting angina, not good PCI targets), stable RCA lesion (non flow limiting).  . Diabetes mellitus without complication ([place])    Takes insulin and metformin  . Dysplasia of cervix, low grade (CIN 1)    in 02 s/p LEEP   . GERD (gastroesophageal reflux disease)    food  related  . History of cancer chemotherapy    last in August 2014  . 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 History of radiation therapy 06/23/2013-08/08/2013   62.4 gray to left breast  . HTN (hypertension)   . Hypercholesteremia   . Insomnia   . Left kidney mass   . LV dysfunction    a. EF 45-50% at time of STEMI 08/2012. b. Echo 04/2013: EF 45-50%, mild focal basal hypertrophy of septum, grade 1 d/d.  Marland Kitchen Myocardial infarction ([place]) 09/07/12  . Peripheral vision loss    left  . Perirectal abscess    11/2018   . Pleural effusion, bilateral   . Pneumonia   . Pneumonia   . Renal artery stenosis ([facility])    a. 20% L RAS in 08/2012 by cath. b. Duplex 07/2012: >60% L RAS.  . Stroke, embolic ([place])    a. 54/6270 post cath. with residual reduced vision in left eye; noted right occiptal lobe/PCA infarct   . UTI (urinary tract infection)   . UTI (urinary tract infection)   . Vasovagal syncope    a. 04/2013 - echo stable compared to prior EF 45-50%, negative carotid duplex.   Past Surgical History:  Procedure Laterality Date  . BACK SURGERY     x3, lower back  . BREAST BIOPSY Left 12/03/2012   U/S Core- Malignant  . BREAST LUMPECTOMY Left 01/08/2013  . BREAST LUMPECTOMY WITH NEEDLE LOCALIZATION AND AXILLARY SENTINEL LYMPH NODE BX Left 01/08/2013   Procedure: LEFT BREAST WIRE GUIDED  LUMPECTOMY AND LEFT AXILLARY SENTINEL  NODE BX;  Surgeon: Rolm Bookbinder, MD;  Location: [facility];  Service: General;  Laterality: Left;  . CATARACT EXTRACTION Bilateral   . CERVICAL BIOPSY  W/ LOOP ELECTRODE EXCISION     02/2001  . CORONARY ANGIOPLASTY WITH STENT PLACEMENT    . EYE SURGERY     b/l cataract   . INCISION AND DRAINAGE PERIRECTAL ABSCESS N/A 12/11/2018   Procedure: IRRIGATION AND DEBRIDEMENT PERIRECTAL ABSCESS;  Surgeon: Johnathan Hausen, MD;  Location: [facility];  Service: General;  Laterality: N/A;  . LEFT HEART CATHETERIZATION WITH CORONARY ANGIOGRAM N/A 09/07/2012   Procedure: LEFT HEART CATHETERIZATION WITH CORONARY ANGIOGRAM;  Surgeon: Troy Sine, MD;  Location: [facility] CATH LAB;  Service: Cardiovascular;  Laterality: N/A;  . LEFT HEART CATHETERIZATION WITH CORONARY ANGIOGRAM  09/09/2012   Procedure: LEFT HEART CATHETERIZATION WITH CORONARY ANGIOGRAM;  Surgeon: Leonie Man, MD;  Location: [facility] CATH LAB;  Service: Cardiovascular;;  . PERCUTANEOUS CORONARY STENT INTERVENTION (PCI-S) N/A 09/07/2012   Procedure: PERCUTANEOUS CORONARY STENT INTERVENTION (PCI-S);  Surgeon: Troy Sine, MD;  Location: [facility] C[facility] LAB;  Service: Cardiovascular;  Laterality: N/A;  . PORT-A-CATH  REMOVAL N/A 10/14/2013   Procedure: REMOVAL PORT-A-CATH;  Surgeon: Rolm Bookbinder, MD;  Location: [facility];  Service: General;  Laterality: N/A;  . PORTACATH PLACEMENT Right 02/17/2013   Procedure: INSERTION PORT-A-CATH;  Surgeon: Rolm Bookbinder, MD;  Location: [facility];  Service: General;  Laterality: Right;  . THORACENTESIS     12/13/2018 atypical cells not definitely diag. of malignancy    Social History   Socioeconomic History  . Marital status: Married    Spouse name: [name]  . Number of children: 2  . Years of education: college  . Highest education level: Not on file  Occupational History    Employer: [employer]  Tobacco Use  . Smoking status: Never Smoker  . Smokeless tobacco: Never Used  Substance and Sexual Activity  . Alcohol use: Not Currently    Comment: socially  . Drug use: No  . Sexual activity: Yes  Other Topics Concern  . Not on file  Social History Narrative   Married   Retired    Former Optometrist    Patient has 2 children and some college education.    Owns guns, wears seat belt, safe in relationship    Social Determinants of Health   Financial Resource Strain:   . Difficulty of Paying Living  Expenses: Not on file  Food Insecurity:   . Worried About Charity fundraiser in the Last Year: Not on file  . Ran Out of Food in the Last Year: Not on file  Transportation Needs:   . Lack of Transportation (Medical): Not on file  . Lack of Transportation (Non-Medical): Not on file  Physical Activity:   . Days of Exercise per Week: Not on file  . Minutes of Exercise per Session: Not on file  Stress:   . Feeling of Stress : Not on file  Social Connections:   . Frequency of Communication with Friends and Family: Not on file  . Frequency of Social Gatherings with Friends and Family: Not on file  . Attends Religious Services: Not on file  . Active Member of Clubs or Organizations: Not on file  . Attends Archivist Meetings: Not on file  . Marital Status: Not on file  Intimate Partner Violence:   . Fear of Current or Ex-Partner: Not on file  . Emotionally Abused: Not on file  . Physically Abused: Not on file  . Sexually Abused: Not on file   Current Outpatient Medications on File Prior to Visit  Medication Sig Dispense Refill  . aspirin 81 MG EC tablet Take 1 tablet (81 mg total) by mouth daily. 30 tablet 12  . atorvastatin (LIPITOR) 80 MG tablet TAKE 1 TABLET BY MOUTH DAILY. 90 tablet 3  . carvedilol (COREG) 25 MG tablet TAKE 1 TABLET BY MOUTH TWICE A DAY WITH MEALS 180 tablet 3  . ciprofloxacin (CIPRO) 500 MG tablet Take 1 tablet (500 mg total) by mouth 2 (two) times daily. w food 10 tablet 0  . ENTRESTO 49-51 MG TAKE 1 TABLET BY MOUTH 2 (TWO) TIMES DAILY. PLEASE KEEP UPCOMING APPT IN AUGUST WITH DR. ROSS FOR FUTURE REFILLS. THANK YOU 180 tablet 3  . ezetimibe (ZETIA) 10 MG tablet TAKE 1 TABLET BY MOUTH EVERY DAY 90 tablet 3  . feeding supplement, ENSURE ENLIVE, (ENSURE ENLIVE) LIQD Take 237 mLs by mouth 2 (two) times daily as needed (If PO intakes of meals are poor). 7 Bottle 12  . furosemide (LASIX) 40 MG tablet TAKE 1 TABLET BY  MOUTH DAILY. PLEASE KEEP UPCOMING APPT IN  AUGUST WITH DR. ROSS FOR FUTURE REFILLS. THANK YOU 90 tablet 3  . glucose blood (ONETOUCH VERIO) test strip Use TID 100 each 12  . hydrALAZINE (APRESOLINE) 10 MG tablet Take 1 tablet (10 mg total) by mouth 2 (two) times daily as needed. For BP >130/>80 90 tablet 2  . insulin regular (NOVOLIN R) 100 units/mL injection Inject into the skin 3 (three) times daily before meals.    . isosorbide mononitrate (IMDUR) 60 MG 24 hr tablet TAKE 1 TABLET BY MOUTH DAILY. 90 tablet 3  . nitroGLYCERIN (NITROSTAT) 0.4 MG SL tablet Place 1 tablet (0.4 mg total) under the tongue every 5 (five) minutes as needed for chest pain. 25 tablet 11  . phenazopyridine (PYRIDIUM) 200 MG tablet Take 1 tablet (200 mg total) by mouth 3 (three) times daily as needed for pain. 6 tablet 0  . polyethylene glycol (MIRALAX / GLYCOLAX) packet Take 17 g by mouth daily. 14 each 0  . senna (SENOKOT) 8.6 MG TABS tablet Take 2 tablets (17.2 mg total) by mouth at bedtime. 60 each 0  . spironolactone (ALDACTONE) 25 MG tablet TAKE 1 TABLET BY MOUTH EVERY DAY 90 tablet 1  . Insulin Glargine (LANTUS SOLOSTAR) 100 UNIT/ML Solostar Pen Inject 18 units [place] at bedtime. (Patient not taking: Reported on 11/04/2019) 5 pen 0  . insulin lispro (HUMALOG) 100 UNIT/ML injection Inject 0.1-0.12 mLs (10-12 Units total) into the skin every morning. (Patient not taking: Reported on 11/04/2019) 10 mL 0   No current facility-administered medications on file prior to visit.   No Known Allergies Family History  Problem Relation Age of Onset  . COPD Mother   . Heart disease Father   . Hypertension Father   . Diabetes Brother   . Hypertension Brother   . Learning disabilities Brother   . Hypertension Brother   . Asthma Daughter   . Cancer Neg Hx   . Hyperlipidemia Neg Hx   . Kidney disease Neg Hx   . Stroke Neg Hx     PE: BP 140/82   Pulse 80   Ht _0  (1.651 m)   Wt 115 lb (52.2 kg)   SpO2 97%   BMI 19.14 kg/m  Wt Readings from Last 3 Encounters:   11/04/19 115 lb (52.2 kg)  06/19/19 109 lb 9.6 oz (49.7 kg)  05/29/19 113 lb (51.3 kg)   Constitutional: Thin, in NAD Eyes: PERRLA, EOMI, no exophthalmos ENT: moist mucous membranes, no thyromegaly, no cervical lymphadenopathy Cardiovascular: RRR, No MRG, + B pitting LE edema Respiratory: CTA B Gastrointestinal: abdomen soft, NT, ND, BS+ Musculoskeletal: no deformities, strength intact in all 4 Skin: moist, warm, no rashes Neurological: no tremor with outstretched hands, but head tremor, DTR normal in all 4  ASSESSMENT: 1. DM2, insulin-dependent, uncontrolled, with long-term complications - CAD - CHF - Cerebrovascular disease-history of stroke - PN  PLAN:  1. Patient with long-standing, uncontrolled diabetes, on sliding scale regular insulin only, which became insufficient.  Latest HbA1c was very high, at 14.8%, 4 months ago. Today: 14.1% (lower but still abysmal). -Patient was on a basal/bolus insulin regimen, however, she could not afford analog insulins and obtained a vials of regular insulin from [facility], which she is using now based on a sliding scale.  I do not have the specifics of the sliding scale.  She mentions that she is using between 1 and 20 units whenever she checks her sugars, but not specifically before  meals.  I advised her that without long-acting insulin and scheduled insulin with meals, it would be extremely difficult to control her diabetes.  Therefore, at this visit, I suggested to switch to an NPH that regular insulin regimen.  I also advised her to inject both N and R insulin before breakfast and before dinner with the same syringe and we discussed about how to mix the insulins up.  I also gave her written instructions about this.  For now, we will start at lower doses and we can adjust the dose if this needed.  I advised her to check her sugars consistently approximately 3 times a day and let me know if they are above or below target, in which case, we will need to  adjust the insulins further.  I gave her flexible doses of regular insulin and advised her how to use them depending on the size and the quality of the meals.  I would suggest not to use a sliding scale for now, but we may need to use this in the future. -She will also check with her pharmacist to see if analog insulins become affordable again -I advised her to: Patient Instructions   Please add:  Insulin Before breakfast Before lunch Before dinner  Regular (R) - clear 10-15 10-15 10-15  NPH (N) - cloudy 15  15   Please inject the insulin 30 min before meals.  Please return in 1.5 months with your sugar log.   - Strongly advised her to start checking sugars at different times of the day - check 3x a day, rotating checks - discussed about CBG targets for treatment: 80-130 mg/dL before meals and <180 mg/dL after meals; target HbA1c <7%. - given sugar log and advised how to fill it and to bring it at next appt  - given foot care handout and explained the principles  - given instructions for hypoglycemia management "15-15 rule"  - advised for yearly eye exams-she is up-to-date - Return to clinic in 1.5 mo with sugar log   Philemon Kingdom, MD PhD [facility] End[facility]

## 2019-11-04 NOTE — Patient Instructions (Signed)
Please add:  Insulin Before breakfast Before lunch Before dinner  Regular (R) - clear 10-15 10-15 10-15  NPH (N) - cloudy 15  15   Please inject the insulin 30 min before meals.  Please return in 1.5 months with your sugar log.   PATIENT INSTRUCTIONS FOR TYPE 2 DIABETES:  DIET AND EXERCISE Diet and exercise is an important part of diabetic treatment.  We recommended aerobic exercise in the form of brisk walking (working between 40-60% of maximal aerobic capacity, similar to brisk walking) for 150 minutes per week (such as 30 minutes five days per week) along with 3 times per week performing 'resistance' training (using various gauge rubber tubes with handles) 5-10 exercises involving the major muscle groups (upper body, lower body and core) performing 10-15 repetitions (or near fatigue) each exercise. Start at half the above goal but build slowly to reach the above goals. If limited by weight, joint pain, or disability, we recommend daily walking in a swimming pool with water up to waist to reduce pressure from joints while allow for adequate exercise.    BLOOD GLUCOSES Monitoring your blood glucoses is important for continued management of your diabetes. Please check your blood glucoses 2-4 times a day: fasting, before meals and at bedtime (you can rotate these measurements - e.g. one day check before the 3 meals, the next day check before 2 of the meals and before bedtime, etc.).   HYPOGLYCEMIA (low blood sugar) Hypoglycemia is usually a reaction to not eating, exercising, or taking too much insulin/ other diabetes drugs.  Symptoms include tremors, sweating, hunger, confusion, headache, etc. Treat IMMEDIATELY with 15 grams of Carbs: . 4 glucose tablets .  cup regular juice/soda . 2 tablespoons raisins . 4 teaspoons sugar . 1 tablespoon honey Recheck blood glucose in 15 mins and repeat above if still symptomatic/blood glucose <100.  RECOMMENDATIONS TO REDUCE YOUR RISK OF DIABETIC  COMPLICATIONS: * Take your prescribed MEDICATION(S) * Follow a DIABETIC diet: Complex carbs, fiber rich foods, (monounsaturated and polyunsaturated) fats * AVOID saturated/trans fats, high fat foods, >2,300 mg salt per day. * EXERCISE at least 5 times a week for 30 minutes or preferably daily.  * DO NOT SMOKE OR DRINK more than 1 drink a day. * Check your FEET every day. Do not wear tightfitting shoes. Contact us if you develop an ulcer * See your EYE doctor once a year or more if needed * Get a FLU shot once a year * Get a PNEUMONIA vaccine once before and once after age 37 years  GOALS:  * Your Hemoglobin A1c of <7%  * fasting sugars need to be <130 * after meals sugars need to be <180 (2h after you start eating) * Your Systolic BP should be [id number] or lower  * Your Diastolic BP should be 80 or lower  * Your HDL (Good Cholesterol) should be 40 or higher  * Your LDL (Bad Cholesterol) should be 100 or lower. * Your Triglycerides should be 150 or lower  * Your Urine microalbumin (kidney function) should be <30 * Your Body Mass Index should be 25 or lower    Please consider the following ways to cut down carbs and fat and increase fiber and micronutrients in your diet: - substitute whole grain for white bread or pasta - substitute brown rice for white rice - substitute 90-calorie flat bread pieces for slices of bread when possible - substitute sweet potatoes or yams for white potatoes - substitute humus for margarine -  substitute tofu for cheese when possible - substitute almond or rice milk for regular milk (would not drink soy milk daily due to concern for soy estrogen influence on breast cancer risk) - substitute dark chocolate for other sweets when possible - substitute water - can add lemon or orange slices for taste - for diet sodas (artificial sweeteners will trick your body that you can eat sweets without getting calories and will lead you to overeating and weight gain in the long  run) - do not skip breakfast or other meals (this will slow down the metabolism and will result in more weight gain over time)  - can try smoothies made from fruit and almond/rice milk in am instead of regular breakfast - can also try old-fashioned (not instant) oatmeal made with almond/rice milk in am - order the dressing on the side when eating salad at a restaurant (pour less than half of the dressing on the salad) - eat as little meat as possible - can try juicing, but should not forget that juicing will get rid of the fiber, so would alternate with eating raw veg./fruits or drinking smoothies - use as little oil as possible, even when using olive oil - can dress a salad with a mix of balsamic vinegar and lemon juice, for e.g. - use agave nectar, stevia sugar, or regular sugar rather than artificial sweateners - steam or broil/roast veggies  - snack on veggies/fruit/nuts (unsalted, preferably) when possible, rather than processed foods - reduce or eliminate aspartame in diet (it is in diet sodas, chewing gum, etc) Read the labels!  Try to read Dr. Janene Harvey book: "Program for Reversing Diabetes" for other ideas for healthy eating.

## 2019-11-20 ENCOUNTER — Telehealth: Payer: Self-pay | Admitting: Internal Medicine

## 2019-11-20 NOTE — Telephone Encounter (Signed)
Mychart was sent to inform patient. 

## 2019-11-20 NOTE — Telephone Encounter (Signed)
She has to be evaluated for an appt for this  Please sch for this reason    [place]

## 2019-11-20 NOTE — Telephone Encounter (Signed)
Pt called about wanting to see if she can get a Rx to get a lift chair she's having trouble getting up out of chair and bed. Please advise and Thank you!  Call pt @ [phone].

## 2019-11-26 ENCOUNTER — Ambulatory Visit (INDEPENDENT_AMBULATORY_CARE_PROVIDER_SITE_OTHER): Payer: Medicare Other | Admitting: Internal Medicine

## 2019-11-26 ENCOUNTER — Other Ambulatory Visit: Payer: Self-pay

## 2019-11-26 ENCOUNTER — Encounter: Payer: Self-pay | Admitting: Internal Medicine

## 2019-11-26 VITALS — BP 166/104 | Ht 65.0 in | Wt 110.0 lb

## 2019-11-26 DIAGNOSIS — E559 Vitamin D deficiency, unspecified: Secondary | ICD-10-CM | POA: Diagnosis not present

## 2019-11-26 DIAGNOSIS — I2510 Atherosclerotic heart disease of native coronary artery without angina pectoris: Secondary | ICD-10-CM | POA: Diagnosis not present

## 2019-11-26 DIAGNOSIS — Z1329 Encounter for screening for other suspected endocrine disorder: Secondary | ICD-10-CM

## 2019-11-26 DIAGNOSIS — Z789 Other specified health status: Secondary | ICD-10-CM

## 2019-11-26 DIAGNOSIS — N3000 Acute cystitis without hematuria: Secondary | ICD-10-CM

## 2019-11-26 DIAGNOSIS — I255 Ischemic cardiomyopathy: Secondary | ICD-10-CM | POA: Diagnosis not present

## 2019-11-26 DIAGNOSIS — I420 Dilated cardiomyopathy: Secondary | ICD-10-CM

## 2019-11-26 DIAGNOSIS — J329 Chronic sinusitis, unspecified: Secondary | ICD-10-CM

## 2019-11-26 DIAGNOSIS — I110 Hypertensive heart disease with heart failure: Secondary | ICD-10-CM | POA: Diagnosis not present

## 2019-11-26 DIAGNOSIS — R600 Localized edema: Secondary | ICD-10-CM | POA: Diagnosis not present

## 2019-11-26 DIAGNOSIS — R269 Unspecified abnormalities of gait and mobility: Secondary | ICD-10-CM | POA: Diagnosis not present

## 2019-11-26 DIAGNOSIS — K118 Other diseases of salivary glands: Secondary | ICD-10-CM

## 2019-11-26 DIAGNOSIS — E1165 Type 2 diabetes mellitus with hyperglycemia: Secondary | ICD-10-CM

## 2019-11-26 DIAGNOSIS — Z8673 Personal history of transient ischemic attack (TIA), and cerebral infarction without residual deficits: Secondary | ICD-10-CM

## 2019-11-26 DIAGNOSIS — E1159 Type 2 diabetes mellitus with other circulatory complications: Secondary | ICD-10-CM

## 2019-11-26 DIAGNOSIS — H9191 Unspecified hearing loss, right ear: Secondary | ICD-10-CM

## 2019-11-26 DIAGNOSIS — R5381 Other malaise: Secondary | ICD-10-CM

## 2019-11-26 DIAGNOSIS — Z7409 Other reduced mobility: Secondary | ICD-10-CM

## 2019-11-26 DIAGNOSIS — I5042 Chronic combined systolic (congestive) and diastolic (congestive) heart failure: Secondary | ICD-10-CM

## 2019-11-26 DIAGNOSIS — H547 Unspecified visual loss: Secondary | ICD-10-CM

## 2019-11-26 DIAGNOSIS — R531 Weakness: Secondary | ICD-10-CM

## 2019-11-26 NOTE — Progress Notes (Addendum)
Virtual Visit via Video Note  I connected with Marisa Forbes  on 11/26/19 at  8:40 AM EST by a video enabled telemedicine application and verified that I am speaking with the correct person using two identifiers.  Location patient: home Location provider:work or home office Persons participating in the virtual visit: patient, provider, pts husband and cat   I discussed the limitations of evaluation and management by telemedicine and the availability of in person appointments. The patient expressed understanding and agreed to proceed.   HPI: Chronic medical issues  1. Uncontrolled DM f/u [facility]  2. HTN/CHF with increased leg edema she has not had echo in a while and c/w worsening leg edema  3. Physical deconditioning decline c/o falls 11/25/19 due to walking on floor with socks now she is using grip socks she was [place] hurt did not hit head but hit knee w/o knee pain. She reports she has to use a raised toilet set in the bathroom. She sleeps on the cough and hard to get up. Watching her today she has to rock to get up off a leather couch to propel herself but upper body and lower body weak and this is hard for her to get up off the couch where she sleeps and sits mostly. It is even hard for her to get up off the raised toilet set and lifting 6 lbs is hard to do She has cane but this is not enough at times and legs shake as she is trying to get up off the of the couch  She is thinking about a scooter  4. H/o stroke in 2016 c/o right eye reduced vision and reduced hearing and buzz in right ear w/o ringing but these sx's new    ROS: See pertinent positives and negatives per HPI.  Past Medical History:  Diagnosis Date  . Arthritis    back  . Balance problems    due to stroke  . Benign head tremor    per pt due to muscle spasm in neck   . Breast cancer ([place]) 12/03/12   a. 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 S/p chemo with CMF and radiation.  .  Cataracts, bilateral   . CHF (congestive heart failure) ([place])    On Monday she said her cardiologist was concerned for possible CHF, but is now improving-per pt.(07/26/15)  . CHF (congestive heart failure) ([place])   . Coronary artery disease    a. STEMI 08/2012: s/p DES to LAD, PTCA to downstream LAD. b. Relook cath same hospitalization: stable post-PCI anatomy with Stable Diag lesions (unlikely cause of resting angina, not good PCI targets), stable RCA lesion (non flow limiting).  . Diabetes mellitus without complication ([place])    Takes insulin and metformin  . Dysplasia of cervix, low grade (CIN 1)    in 02 s/p LEEP   . GERD (gastroesophageal reflux disease)    food related  . History of cancer chemotherapy    last in August 2014  . 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 History of radiation therapy 06/23/2013-08/08/2013   62.4 gray to left breast  . HTN (hypertension)   . Hypercholesteremia   . Insomnia   . Left kidney mass   . LV dysfunction    a. EF 45-50% at time of STEMI 08/2012. b. Echo 04/2013: EF  45-50%, mild focal basal hypertrophy of septum, grade 1 d/d.  Marland Kitchen Myocardial infarction ([place]) 09/07/12  . Peripheral vision loss    left  . Perirectal abscess    11/2018   . Pleural effusion, bilateral   . Pneumonia   . Pneumonia   . Renal artery stenosis ([facility])    a. 20% L RAS in 08/2012 by cath. b. Duplex 07/2012: >60% L RAS.  . Stroke, embolic ([place])    a. 82/4235 post cath. with residual reduced vision in left eye; noted right occiptal lobe/PCA infarct   . UTI (urinary tract infection)   . UTI (urinary tract infection)   . Vasovagal syncope    a. 04/2013 - echo stable compared to prior EF 45-50%, negative carotid duplex.    Past Surgical History:  Procedure Laterality Date  . BACK SURGERY     x3, lower  back  . BREAST BIOPSY Left 12/03/2012   U/S Core- Malignant  . BREAST LUMPECTOMY Left 01/08/2013  . BREAST LUMPECTOMY WITH NEEDLE LOCALIZATION AND AXILLARY SENTINEL LYMPH NODE BX Left 01/08/2013   Procedure: LEFT BREAST WIRE GUIDED  LUMPECTOMY AND LEFT AXILLARY SENTINEL  NODE BX;  Surgeon: Rolm Bookbinder, MD;  Location: [facility];  Service: General;  Laterality: Left;  . CATARACT EXTRACTION Bilateral   . CERVICAL BIOPSY  W/ LOOP ELECTRODE EXCISION     02/2001  . CORONARY ANGIOPLASTY WITH STENT PLACEMENT    . EYE SURGERY     b/l cataract   . INCISION AND DRAINAGE PERIRECTAL ABSCESS N/A 12/11/2018   Procedure: IRRIGATION AND DEBRIDEMENT PERIRECTAL ABSCESS;  Surgeon: Johnathan Hausen, MD;  Location: [facility];  Service: General;  Laterality: N/A;  . LEFT HEART CATHETERIZATION WITH CORONARY ANGIOGRAM N/A 09/07/2012   Procedure: LEFT HEART CATHETERIZATION WITH CORONARY ANGIOGRAM;  Surgeon: Troy Sine, MD;  Location: [facility] CATH LAB;  Service: Cardiovascular;  Laterality: N/A;  . LEFT HEART CATHETERIZATION WITH CORONARY ANGIOGRAM  09/09/2012   Procedure: LEFT HEART CATHETERIZATION WITH CORONARY ANGIOGRAM;  Surgeon: Leonie Man, MD;  Location: [facility] CATH LAB;  Service: Cardiovascular;;  . PERCUTANEOUS CORONARY STENT INTERVENTION (PCI-S) N/A 09/07/2012   Procedure: PERCUTANEOUS CORONARY STENT INTERVENTION (PCI-S);  Surgeon: Troy Sine, MD;  Location: [facility] C[facility] LAB;  Service: Cardiovascular;  Laterality: N/A;  . PORT-A-CATH REMOVAL N/A 10/14/2013   Procedure: REMOVAL PORT-A-CATH;  Surgeon: Rolm Bookbinder, MD;  Location: [facility];  Service: General;  Laterality: N/A;  . PORTACATH PLACEMENT Right 02/17/2013   Procedure: INSERTION PORT-A-CATH;  Surgeon: Rolm Bookbinder, MD;  Location: [facility];  Service: General;  Laterality: Right;  . THORACENTESIS     12/13/2018 atypical cells not definitely diag. of malignancy     Family History  Problem Relation Age of Onset  . COPD Mother   . Heart disease Father   .  Hypertension Father   . Diabetes Brother   . Hypertension Brother   . Learning disabilities Brother   . Hypertension Brother   . Asthma Daughter   . Cancer Neg Hx   . Hyperlipidemia Neg Hx   . Kidney disease Neg Hx   . Stroke Neg Hx     SOCIAL HX:  Married Retired  Former Optometrist  Patient has 2 children and some college education.  Owns guns, wears seat belt, safe in relationship  1 cat   Current Outpatient Medications:  .  aspirin 81 MG EC tablet, Take 1 tablet (81 mg total) by mouth daily., Disp: 30 tablet, Rfl: 12 .  atorvastatin (LIPITOR)  80 MG tablet, TAKE 1 TABLET BY MOUTH DAILY., Disp: 90 tablet, Rfl: 3 .  carvedilol (COREG) 25 MG tablet, TAKE 1 TABLET BY MOUTH TWICE A DAY WITH MEALS, Disp: 180 tablet, Rfl: 3 .  ciprofloxacin (CIPRO) 500 MG tablet, Take 1 tablet (500 mg total) by mouth 2 (two) times daily. w food, Disp: 10 tablet, Rfl: 0 .  ENTRESTO 49-51 MG, TAKE 1 TABLET BY MOUTH 2 (TWO) TIMES DAILY. PLEASE KEEP UPCOMING APPT IN AUGUST WITH DR. ROSS FOR FUTURE REFILLS. THANK YOU, Disp: 180 tablet, Rfl: 3 .  ezetimibe (ZETIA) 10 MG tablet, TAKE 1 TABLET BY MOUTH EVERY DAY, Disp: 90 tablet, Rfl: 3 .  feeding supplement, ENSURE ENLIVE, (ENSURE ENLIVE) LIQD, Take 237 mLs by mouth 2 (two) times daily as needed (If PO intakes of meals are poor)., Disp: 7 Bottle, Rfl: 12 .  furosemide (LASIX) 40 MG tablet, TAKE 1 TABLET BY MOUTH DAILY. PLEASE KEEP UPCOMING APPT IN AUGUST WITH DR. ROSS FOR FUTURE REFILLS. THANK YOU, Disp: 90 tablet, Rfl: 3 .  glucose blood (ONETOUCH VERIO) test strip, Use TID, Disp: 100 each, Rfl: 12 .  hydrALAZINE (APRESOLINE) 10 MG tablet, Take 1 tablet (10 mg total) by mouth 2 (two) times daily as needed. For BP >130/>80, Disp: 90 tablet, Rfl: 2 .  insulin NPH Human (NOVOLIN N RELION) 100 UNIT/ML injection, Inject 0.15 mLs (15 Units total) into the skin 2 (two) times daily before a meal., Disp: 10 mL, Rfl: 11 .  insulin regular (NOVOLIN R RELION) 100 units/mL  injection, Inject 0.1-0.15 mLs (10-15 Units total) into the skin 3 (three) times daily before meals., Disp: 10 mL, Rfl: 11 .  isosorbide mononitrate (IMDUR) 60 MG 24 hr tablet, TAKE 1 TABLET BY MOUTH DAILY., Disp: 90 tablet, Rfl: 3 .  nitroGLYCERIN (NITROSTAT) 0.4 MG SL tablet, Place 1 tablet (0.4 mg total) under the tongue every 5 (five) minutes as needed for chest pain., Disp: 25 tablet, Rfl: 11 .  phenazopyridine (PYRIDIUM) 200 MG tablet, Take 1 tablet (200 mg total) by mouth 3 (three) times daily as needed for pain., Disp: 6 tablet, Rfl: 0 .  polyethylene glycol (MIRALAX / GLYCOLAX) packet, Take 17 g by mouth daily., Disp: 14 each, Rfl: 0 .  senna (SENOKOT) 8.6 MG TABS tablet, Take 2 tablets (17.2 mg total) by mouth at bedtime., Disp: 60 each, Rfl: 0 .  spironolactone (ALDACTONE) 25 MG tablet, TAKE 1 TABLET BY MOUTH EVERY DAY, Disp: 90 tablet, Rfl: 1  EXAM:  VITALS per patient if applicable:  GENERAL: alert, oriented, appears well and in no acute distress  HEENT: atraumatic, conjunttiva clear, no obvious abnormalities on inspection of external nose and ears  NECK: normal movements of the head and neck  LUNGS: on inspection no signs of respiratory distress, breathing rate appears normal, no obvious gross SOB, gasping or wheezing  CV: no obvious cyanosis, +2+ leg edema b/l   MS: 3-/5 strength upper body and 3+/5 strength lower body shaking to get up as sign of physical weakness   PSYCH/NEURO: pleasant and cooperative, no obvious depression or anxiety, speech and thought processing grossly intact  ASSESSMENT AND PLAN:  Discussed the following assessment and plan:  Leg edema - Plan: Ambulatory referral to Cardiology, ECHOCARDIOGRAM COMPLETE, Ambulatory referral to Home Health  Hypertensive heart disease with chronic combined systolic and diastolic congestive heart failure (HCC)/ischemic cardiomyopathy/CHF with reduced EF - Plan: Ambulatory referral to Cardiology, ECHOCARDIOGRAM  COMPLETE, Ambulatory referral to Home Health, Comprehensive metabolic panel, Lipid panel,  CBC w/Diff -CC Dr. Harrington Challenger of note she is not taking Entresto   History of completed stroke - Plan: Ambulatory referral to [place], MR [place], Ambulatory referral to Neurology  Poorly controlled type 2 diabetes mellitus with circulatory disorder ([place]) - Plan: Ambulatory referral to [facility], Comprehensive metabolic panel, Lipid panel, Hemoglobin A1c, Urinalysis, Routine w reflex microscopic, Microalbumin / creatinine urine ratio -cont meds as instructed by [facility]  Decreased activities of daily living (ADL) - Plan: Ambulatory referral to [facility] PT/OT Physical deconditioning Impaired instrumental activities of daily living (IADL)  Abnormal gait  Reduced vision - Plan: MR BRAIN/IAC W WO CONTRAST, Ambulatory referral to Neurology Decreased hearing of right ear - Plan: MR BRAIN/IAC W WO CONTRAST, Ambulatory referral to Neurology Weakness - Plan: MR BRAIN/IAC W WO CONTRAST, Ambulatory referral to Neurology Of note pt is losing weight for next visit (consider hep C testing in future)  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 Consider ENT consultation for further evaluation. 6. Chronic sphenoid sinus disease with inspissated secretion, although fungal sinus infection cannot be excluded.   HM Flu high utd  Tdap 06/01/15  shingrix never had  pna 23 10/03/12 consider repeat in future  prevnar 11/14/13     LMP ? 2005 Pap -h/o cervical bx 01/30/01 CIN 1 mild cervical dysplasia s/p LEEP 03/07/01  -ask about ob/gyn at f/u and last PAP and consider another if not had in a while   mammo h/o breast cancer (invasive ductal ca with high grade focal ca in situ with necrosis w/o lymph node involvement) in 2014 s/p radiation and chemo s/p left lumpectomy  -04/25/19 negative  rec screening in 1 year   DEXA -04/25/19 osteopenia + check vitamin D rec mvt with ca  Colonoscopy never had consider cologuard in future no FH colon cancer. Though with weight loss EGD/colonoscopy may be preferred   Of note thoracentesis 12/13/18 with atypical cells not definitely diagnostic of malignancy   Previous PCP Dr. Arlys John   -we discussed possible serious and likely etiologies, options for evaluation and workup, limitations of telemedicine visit vs in person visit, treatment, treatment risks and precautions. Pt prefers to treat via telemedicine empirically rather then risking or undertaking an in person visit at this moment. Patient agrees to seek prompt in person care if worsening, new symptoms arise, or if is not improving with treatment.   I discussed the assessment and treatment plan with the patient. The patient was provided an opportunity to ask questions and all were answered. The patient agreed with the plan and demonstrated an understanding of the instructions.   The patient was advised to call back or seek an in-person evaluation if the symptoms worsen or if the condition fails to improve as anticipated.  30 minutes see chronic medical illness note and DME lift chair note from today 11/26/19 Nino Glow McLean-Scocuzza, MD

## 2019-12-03 ENCOUNTER — Telehealth: Payer: Self-pay | Admitting: Internal Medicine

## 2019-12-03 NOTE — Telephone Encounter (Signed)
Patient's husband called and wanted to know what the statusof the left chair and physical therapy. Patient had the visit with Dr. Aundra Dubin on 11/26/2019.

## 2019-12-03 NOTE — Telephone Encounter (Signed)
I am getting the lift chair  It takes some time with all of the paperwork sometimes 2 weeks  Let them know we are working on it   [employer]

## 2019-12-03 NOTE — Telephone Encounter (Signed)
I reviewed last office note, & there was no mention of either found.

## 2019-12-04 NOTE — Telephone Encounter (Signed)
I called and let patient know that left chair paperwork was being worked on. This did mean physical therapy as well or was a referral going to be placed for that?

## 2019-12-05 ENCOUNTER — Encounter: Payer: Self-pay | Admitting: Internal Medicine

## 2019-12-05 ENCOUNTER — Telehealth: Payer: Self-pay

## 2019-12-05 DIAGNOSIS — Z789 Other specified health status: Secondary | ICD-10-CM | POA: Insufficient documentation

## 2019-12-05 DIAGNOSIS — R269 Unspecified abnormalities of gait and mobility: Secondary | ICD-10-CM | POA: Insufficient documentation

## 2019-12-05 DIAGNOSIS — R5381 Other malaise: Secondary | ICD-10-CM | POA: Insufficient documentation

## 2019-12-05 NOTE — Telephone Encounter (Signed)
Left message for patient to call back  

## 2019-12-05 NOTE — Telephone Encounter (Signed)
Attempted to contact pt again.  Rang one time and disconnected.

## 2019-12-05 NOTE — Addendum Note (Signed)
Addended by: Orland Mustard on: 12/05/2019 01:39 PM   Modules accepted: Orders

## 2019-12-05 NOTE — Telephone Encounter (Signed)
-----   Message from Fay Records, MD sent at 12/05/2019  1:18 PM EST ----- See if can get patient in ----- Message ----- From: McLean-Scocuzza, Nino Glow, MD Sent: 12/05/2019   8:55 AM EST To: Fay Records, MD  Pt very chronically ill CHF not on entresto rec f/u with you asap and echo asap ive ordered echo and referral to you   Take care [place]

## 2019-12-12 DIAGNOSIS — Z794 Long term (current) use of insulin: Secondary | ICD-10-CM | POA: Diagnosis not present

## 2019-12-12 DIAGNOSIS — K219 Gastro-esophageal reflux disease without esophagitis: Secondary | ICD-10-CM | POA: Diagnosis not present

## 2019-12-12 DIAGNOSIS — I2510 Atherosclerotic heart disease of native coronary artery without angina pectoris: Secondary | ICD-10-CM | POA: Diagnosis not present

## 2019-12-12 DIAGNOSIS — Z7982 Long term (current) use of aspirin: Secondary | ICD-10-CM | POA: Diagnosis not present

## 2019-12-12 DIAGNOSIS — Z9181 History of falling: Secondary | ICD-10-CM | POA: Diagnosis not present

## 2019-12-12 DIAGNOSIS — E1159 Type 2 diabetes mellitus with other circulatory complications: Secondary | ICD-10-CM | POA: Diagnosis not present

## 2019-12-12 DIAGNOSIS — H9191 Unspecified hearing loss, right ear: Secondary | ICD-10-CM | POA: Diagnosis not present

## 2019-12-12 DIAGNOSIS — H547 Unspecified visual loss: Secondary | ICD-10-CM | POA: Diagnosis not present

## 2019-12-12 DIAGNOSIS — M479 Spondylosis, unspecified: Secondary | ICD-10-CM | POA: Diagnosis not present

## 2019-12-12 DIAGNOSIS — I110 Hypertensive heart disease with heart failure: Secondary | ICD-10-CM | POA: Diagnosis not present

## 2019-12-12 DIAGNOSIS — E559 Vitamin D deficiency, unspecified: Secondary | ICD-10-CM | POA: Diagnosis not present

## 2019-12-12 DIAGNOSIS — Z8673 Personal history of transient ischemic attack (TIA), and cerebral infarction without residual deficits: Secondary | ICD-10-CM | POA: Diagnosis not present

## 2019-12-12 DIAGNOSIS — I5042 Chronic combined systolic (congestive) and diastolic (congestive) heart failure: Secondary | ICD-10-CM | POA: Diagnosis not present

## 2019-12-12 DIAGNOSIS — Z853 Personal history of malignant neoplasm of breast: Secondary | ICD-10-CM | POA: Diagnosis not present

## 2019-12-12 DIAGNOSIS — G4700 Insomnia, unspecified: Secondary | ICD-10-CM | POA: Diagnosis not present

## 2019-12-12 DIAGNOSIS — I420 Dilated cardiomyopathy: Secondary | ICD-10-CM | POA: Diagnosis not present

## 2019-12-12 DIAGNOSIS — I255 Ischemic cardiomyopathy: Secondary | ICD-10-CM | POA: Diagnosis not present

## 2019-12-15 ENCOUNTER — Telehealth: Payer: Self-pay | Admitting: Internal Medicine

## 2019-12-15 NOTE — Telephone Encounter (Signed)
[place] for verbal orders needs home PT and OT  Call back  [place]

## 2019-12-15 NOTE — Telephone Encounter (Signed)
Verbals have been given. 

## 2019-12-15 NOTE — Telephone Encounter (Signed)
Elta Guadeloupe called with  [name] [phone]  Needing verbal orders for home health PT starting for 2/12  1week -1 2week -6

## 2019-12-15 NOTE — Telephone Encounter (Signed)
Okay for verbal orders. 

## 2019-12-16 ENCOUNTER — Telehealth: Payer: Self-pay | Admitting: Internal Medicine

## 2019-12-16 DIAGNOSIS — I420 Dilated cardiomyopathy: Secondary | ICD-10-CM | POA: Diagnosis not present

## 2019-12-16 DIAGNOSIS — I110 Hypertensive heart disease with heart failure: Secondary | ICD-10-CM | POA: Diagnosis not present

## 2019-12-16 DIAGNOSIS — I5042 Chronic combined systolic (congestive) and diastolic (congestive) heart failure: Secondary | ICD-10-CM | POA: Diagnosis not present

## 2019-12-16 DIAGNOSIS — I2510 Atherosclerotic heart disease of native coronary artery without angina pectoris: Secondary | ICD-10-CM | POA: Diagnosis not present

## 2019-12-16 DIAGNOSIS — I255 Ischemic cardiomyopathy: Secondary | ICD-10-CM | POA: Diagnosis not present

## 2019-12-16 DIAGNOSIS — E1159 Type 2 diabetes mellitus with other circulatory complications: Secondary | ICD-10-CM | POA: Diagnosis not present

## 2019-12-16 NOTE — Telephone Encounter (Signed)
Marisa Forbes with [name] called to report blood pressure 152/108. Pt had not had blood pressure medicine prior to taking the reading. Marisa Forbes got her to take it right after.

## 2019-12-17 ENCOUNTER — Telehealth: Payer: Self-pay | Admitting: Internal Medicine

## 2019-12-17 NOTE — Telephone Encounter (Signed)
Left message to return call 

## 2019-12-17 NOTE — Telephone Encounter (Signed)
Message added to previous encounter.  °

## 2019-12-17 NOTE — Telephone Encounter (Signed)
Marisa Forbes with [name] was returning your call. Please call back at your convenience [phone]

## 2019-12-17 NOTE — Telephone Encounter (Signed)
Call Marisa Forbes and pt rec take BP meds and check BP 2 hours later Coreg, spironlactone, entresto (not sure she has) she also has as needed hyralazine if BP elevated   She needs to take BP meds goal <130/<80  If uncontrolled BP this can affect her heart as well  Call back in 1-2 weeks and let me know what bp doing please   [place]

## 2019-12-17 NOTE — Telephone Encounter (Signed)
Called to inform Marisa Forbes, she will not be seeing the patient again until next week. Will call and directly inform the patient of this information.   Called and spoke with the patient. Patient verbalized understanding. Patient states she has not gotten the [place] yet but is working on it. Patient will call back with any further concerns or high BP.

## 2019-12-17 NOTE — Telephone Encounter (Signed)
H[place], Meredith L 58 minutes ago (1:41 PM)  [place] with [name] was returning your call. Please call back at your convenience [phone]

## 2019-12-18 ENCOUNTER — Ambulatory Visit: Payer: Medicare Other | Admitting: Internal Medicine

## 2019-12-18 DIAGNOSIS — I5042 Chronic combined systolic (congestive) and diastolic (congestive) heart failure: Secondary | ICD-10-CM | POA: Diagnosis not present

## 2019-12-18 DIAGNOSIS — E1159 Type 2 diabetes mellitus with other circulatory complications: Secondary | ICD-10-CM | POA: Diagnosis not present

## 2019-12-18 DIAGNOSIS — I255 Ischemic cardiomyopathy: Secondary | ICD-10-CM | POA: Diagnosis not present

## 2019-12-18 DIAGNOSIS — I2510 Atherosclerotic heart disease of native coronary artery without angina pectoris: Secondary | ICD-10-CM | POA: Diagnosis not present

## 2019-12-18 DIAGNOSIS — I420 Dilated cardiomyopathy: Secondary | ICD-10-CM | POA: Diagnosis not present

## 2019-12-18 DIAGNOSIS — I110 Hypertensive heart disease with heart failure: Secondary | ICD-10-CM | POA: Diagnosis not present

## 2019-12-19 DIAGNOSIS — I420 Dilated cardiomyopathy: Secondary | ICD-10-CM | POA: Diagnosis not present

## 2019-12-19 DIAGNOSIS — I110 Hypertensive heart disease with heart failure: Secondary | ICD-10-CM | POA: Diagnosis not present

## 2019-12-19 DIAGNOSIS — E1159 Type 2 diabetes mellitus with other circulatory complications: Secondary | ICD-10-CM | POA: Diagnosis not present

## 2019-12-19 DIAGNOSIS — I255 Ischemic cardiomyopathy: Secondary | ICD-10-CM | POA: Diagnosis not present

## 2019-12-19 DIAGNOSIS — I5042 Chronic combined systolic (congestive) and diastolic (congestive) heart failure: Secondary | ICD-10-CM | POA: Diagnosis not present

## 2019-12-19 DIAGNOSIS — I2510 Atherosclerotic heart disease of native coronary artery without angina pectoris: Secondary | ICD-10-CM | POA: Diagnosis not present

## 2019-12-22 ENCOUNTER — Ambulatory Visit (HOSPITAL_COMMUNITY)
Admission: RE | Admit: 2019-12-22 | Discharge: 2019-12-22 | Disposition: A | Discharge status: Home / Self Care | Payer: Medicare Other | Source: Ambulatory Visit | Attending: Internal Medicine | Admitting: Internal Medicine

## 2019-12-22 DIAGNOSIS — Z8673 Personal history of transient ischemic attack (TIA), and cerebral infarction without residual deficits: Secondary | ICD-10-CM | POA: Insufficient documentation

## 2019-12-22 DIAGNOSIS — R531 Weakness: Secondary | ICD-10-CM | POA: Insufficient documentation

## 2019-12-22 DIAGNOSIS — H9191 Unspecified hearing loss, right ear: Secondary | ICD-10-CM | POA: Diagnosis not present

## 2019-12-22 DIAGNOSIS — H547 Unspecified visual loss: Secondary | ICD-10-CM | POA: Insufficient documentation

## 2019-12-22 DIAGNOSIS — K119 Disease of salivary gland, unspecified: Secondary | ICD-10-CM | POA: Diagnosis not present

## 2019-12-22 LAB — POCT I-STAT CREATININE: Creatinine, Ser: 0.4 mg/dL — ABNORMAL LOW (ref 0.44–1.00)

## 2019-12-22 MED ORDER — GADOBUTROL 1 MMOL/ML IV SOLN
5.0000 mL | Freq: Once | INTRAVENOUS | Status: AC | PRN
  Administered 2019-12-22: 5 mL via INTRAVENOUS

## 2019-12-23 ENCOUNTER — Telehealth: Payer: Self-pay | Admitting: Internal Medicine

## 2019-12-23 DIAGNOSIS — I420 Dilated cardiomyopathy: Secondary | ICD-10-CM | POA: Diagnosis not present

## 2019-12-23 DIAGNOSIS — E1159 Type 2 diabetes mellitus with other circulatory complications: Secondary | ICD-10-CM | POA: Diagnosis not present

## 2019-12-23 DIAGNOSIS — I110 Hypertensive heart disease with heart failure: Secondary | ICD-10-CM | POA: Diagnosis not present

## 2019-12-23 DIAGNOSIS — I5042 Chronic combined systolic (congestive) and diastolic (congestive) heart failure: Secondary | ICD-10-CM | POA: Diagnosis not present

## 2019-12-23 DIAGNOSIS — I2510 Atherosclerotic heart disease of native coronary artery without angina pectoris: Secondary | ICD-10-CM | POA: Diagnosis not present

## 2019-12-23 DIAGNOSIS — I255 Ischemic cardiomyopathy: Secondary | ICD-10-CM | POA: Diagnosis not present

## 2019-12-23 NOTE — Telephone Encounter (Signed)
[name] from [facility] called to give pt's blood pressure reading for today. 148/100. She states that pt had not taken her blood pressure medication and that she would get her to take it asap.

## 2019-12-23 NOTE — Telephone Encounter (Signed)
Please advise 

## 2019-12-23 NOTE — Telephone Encounter (Signed)
She needs to take BP meds  Wait 2 hours and call back  Have elaine have pt take BP wait 2 hours and call back and she needs to check BP 2 hours after BP meds  [name] Inform elaine med compliance is an issue  [place]

## 2019-12-23 NOTE — Telephone Encounter (Signed)
Spoke with Marisa Forbes and patient to inform of this information. They both verbalized understanding.    Patient is inquiring about where the order for her left chair was sent to as per the 12/03/19 telephone encounter in her chart.   Please advise

## 2019-12-26 DIAGNOSIS — I255 Ischemic cardiomyopathy: Secondary | ICD-10-CM | POA: Diagnosis not present

## 2019-12-26 DIAGNOSIS — I420 Dilated cardiomyopathy: Secondary | ICD-10-CM | POA: Diagnosis not present

## 2019-12-26 DIAGNOSIS — I110 Hypertensive heart disease with heart failure: Secondary | ICD-10-CM | POA: Diagnosis not present

## 2019-12-26 DIAGNOSIS — E1159 Type 2 diabetes mellitus with other circulatory complications: Secondary | ICD-10-CM | POA: Diagnosis not present

## 2019-12-26 DIAGNOSIS — I2510 Atherosclerotic heart disease of native coronary artery without angina pectoris: Secondary | ICD-10-CM | POA: Diagnosis not present

## 2019-12-26 DIAGNOSIS — I5042 Chronic combined systolic (congestive) and diastolic (congestive) heart failure: Secondary | ICD-10-CM | POA: Diagnosis not present

## 2019-12-26 NOTE — Progress Notes (Signed)
Cardiology Office Note   Date:  12/29/2019   ID:  Marisa Forbes, DOB 16-Feb-1951, MRN [id number]  PCP:  McLean-Scocuzza, Nino Glow, MD  Cardiologist:   Dorris Carnes, MD   No chief complaint on file.  F/U of CAD      History of Present Illness: Marisa Forbes is a 69 y.o. female with a history of HTN, breast CA, DM, embolic CVA, RAS and CAD (s/p STEMI 2013 with DES to LAD and PTCA to distal LAD) Pt also had hx of systolic CHF  At one point  LVEF 20 to 25%  Admited Dec 2017 to [facility] with decompensated CHF   Echo LVEF 30 to 35%   Cardiac MR in Jan 2018:   Scar in the anteior, anteroseptal, apical walls     Admitted in Jan 2019 to [facility] with CHF, hypertensive urgency   BP 220 on arriva   Echo: LVEF 45 to 50%  Akinesis of apex, anteroseptal wall   Myovue  Scan in Feb 2019 showed scar but no ischemia   Plan for medical Rx   I saw the pt in clinic back in [place]  Since seen patient says starting around Thanksgiving she developed lower extremity edema.  Her son is here with her and says she had been cooking a lot at that time and eating different types of food.  Over this time since November  she has continued to have lower extremity edema.  Edema has limited her activity.  She also notes shortness of breath with activity.  Her son reports she is having difficulty getting out of chairs and getting up to the bathroom at night.  They have physical and occupational therapy coming to their home now.  She denies chest pain.  No significant dizziness no syncope.  Denies PND.  Says she sleeps in a recliner and has done this for years now .  Outpatient Medications Prior to Visit  Medication Sig Dispense Refill  . aspirin 81 MG EC tablet Take 1 tablet (81 mg total) by mouth daily. 30 tablet 12  . atorvastatin (LIPITOR) 80 MG tablet TAKE 1 TABLET BY MOUTH DAILY. 90 tablet 3  . carvedilol (COREG) 25 MG tablet TAKE 1 TABLET BY MOUTH TWICE A DAY WITH MEALS 180 tablet 3  . ENTRESTO 49-51 MG TAKE 1  TABLET BY MOUTH 2 (TWO) TIMES DAILY. PLEASE KEEP UPCOMING APPT IN AUGUST WITH DR. Alonte Wulff FOR FUTURE REFILLS. THANK YOU 180 tablet 3  . ezetimibe (ZETIA) 10 MG tablet TAKE 1 TABLET BY MOUTH EVERY DAY 90 tablet 3  . feeding supplement, ENSURE ENLIVE, (ENSURE ENLIVE) LIQD Take 237 mLs by mouth 2 (two) times daily as needed (If PO intakes of meals are poor). 7 Bottle 12  . furosemide (LASIX) 40 MG tablet TAKE 1 TABLET BY MOUTH DAILY. PLEASE KEEP UPCOMING APPT IN AUGUST WITH DR. TRUE Garciamartinez FOR FUTURE REFILLS. THANK YOU 90 tablet 3  . glucose blood (ONETOUCH VERIO) test strip Use TID 100 each 12  . hydrALAZINE (APRESOLINE) 10 MG tablet Take 1 tablet (10 mg total) by mouth 2 (two) times daily as needed. For BP >130/>80 90 tablet 2  . insulin NPH Human (NOVOLIN N RELION) 100 UNIT/ML injection Inject 0.15 mLs (15 Units total) into the skin 2 (two) times daily before a meal. 10 mL 11  . insulin regular (NOVOLIN R RELION) 100 units/mL injection Inject 0.1-0.15 mLs (10-15 Units total) into the skin 3 (three) times daily before meals. 10 mL 11  .  isosorbide mononitrate (IMDUR) 60 MG 24 hr tablet TAKE 1 TABLET BY MOUTH DAILY. 90 tablet 3  . nitroGLYCERIN (NITROSTAT) 0.4 MG SL tablet Place 1 tablet (0.4 mg total) under the tongue every 5 (five) minutes as needed for chest pain. 25 tablet 11  . polyethylene glycol (MIRALAX / GLYCOLAX) packet Take 17 g by mouth daily. 14 each 0  . spironolactone (ALDACTONE) 25 MG tablet TAKE 1 TABLET BY MOUTH EVERY DAY (Patient not taking: Reported on 12/29/2019) 90 tablet 1  . ciprofloxacin (CIPRO) 500 MG tablet Take 1 tablet (500 mg total) by mouth 2 (two) times daily. w food (Patient not taking: Reported on 12/29/2019) 10 tablet 0  . phenazopyridine (PYRIDIUM) 200 MG tablet Take 1 tablet (200 mg total) by mouth 3 (three) times daily as needed for pain. (Patient not taking: Reported on 12/29/2019) 6 tablet 0  . senna (SENOKOT) 8.6 MG TABS tablet Take 2 tablets (17.2 mg total) by mouth at bedtime.  (Patient not taking: Reported on 12/29/2019) 60 each 0   No facility-administered medications prior to visit.     Allergies:   Patient has no known allergies.   Past Medical History:  Diagnosis Date  . Arthritis    back  . Balance problems    due to stroke  . Benign head tremor    per pt due to muscle spasm in neck   . Breast cancer ([place]) 12/03/12   a. 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 S/p chemo with CMF and radiation.  . Cataracts, bilateral   . CHF (congestive heart failure) ([place])    On Monday she said her cardiologist was concerned for possible CHF, but is now improving-per pt.(07/26/15)  . CHF (congestive heart failure) ([place])   . Coronary artery disease    a. STEMI 08/2012: s/p DES to LAD, PTCA to downstream LAD. b. Relook cath same hospitalization: stable post-PCI anatomy with Stable Diag lesions (unlikely cause of resting angina, not good PCI targets), stable RCA lesion (non flow limiting).  . Diabetes mellitus without complication ([place])    Takes insulin and metformin  . Dysplasia of cervix, low grade (CIN 1)    in 02 s/p LEEP   . GERD (gastroesophageal reflux disease)    food related  . History of cancer chemotherapy    last in August 2014  . 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 History of radiation therapy 06/23/2013-08/08/2013   62.4 gray to left breast  . HTN (hypertension)   . Hypercholesteremia   . Insomnia   . Left kidney mass   . LV dysfunction    a. EF 45-50% at time of STEMI 08/2012. b. Echo 04/2013: EF 45-50%, mild focal basal hypertrophy of septum, grade 1 d/d.  Marland Kitchen Myocardial infarction ([place]) 09/07/12  . Peripheral vision loss    left  . Perirectal abscess    11/2018   . Pleural effusion, bilateral   . Pneumonia   . Pneumonia   . Renal artery  stenosis ([facility])    a. 20% L RAS in 08/2012 by cath. b. Duplex 07/2012: >60% L RAS.  . Stroke, embolic ([place])    a. 16/1096 post cath. with residual reduced vision in left eye; noted right occiptal lobe/PCA infarct   . UTI (urinary tract infection)   . UTI (urinary tract infection)   .  Vasovagal syncope    a. 04/2013 - echo stable compared to prior EF 45-50%, negative carotid duplex.    Past Surgical History:  Procedure Laterality Date  . BACK SURGERY     x3, lower back  . BREAST BIOPSY Left 12/03/2012   U/S Core- Malignant  . BREAST LUMPECTOMY Left 01/08/2013  . BREAST LUMPECTOMY WITH NEEDLE LOCALIZATION AND AXILLARY SENTINEL LYMPH NODE BX Left 01/08/2013   Procedure: LEFT BREAST WIRE GUIDED  LUMPECTOMY AND LEFT AXILLARY SENTINEL  NODE BX;  Surgeon: Rolm Bookbinder, MD;  Location: [facility];  Service: General;  Laterality: Left;  . CATARACT EXTRACTION Bilateral   . CERVICAL BIOPSY  W/ LOOP ELECTRODE EXCISION     02/2001  . CORONARY ANGIOPLASTY WITH STENT PLACEMENT    . EYE SURGERY     b/l cataract   . INCISION AND DRAINAGE PERIRECTAL ABSCESS N/A 12/11/2018   Procedure: IRRIGATION AND DEBRIDEMENT PERIRECTAL ABSCESS;  Surgeon: Johnathan Hausen, MD;  Location: [facility];  Service: General;  Laterality: N/A;  . LEFT HEART CATHETERIZATION WITH CORONARY ANGIOGRAM N/A 09/07/2012   Procedure: LEFT HEART CATHETERIZATION WITH CORONARY ANGIOGRAM;  Surgeon: Troy Sine, MD;  Location: [facility] CATH LAB;  Service: Cardiovascular;  Laterality: N/A;  . LEFT HEART CATHETERIZATION WITH CORONARY ANGIOGRAM  09/09/2012   Procedure: LEFT HEART CATHETERIZATION WITH CORONARY ANGIOGRAM;  Surgeon: Leonie Man, MD;  Location: [facility] CATH LAB;  Service: Cardiovascular;;  . PERCUTANEOUS CORONARY STENT INTERVENTION (PCI-S) N/A 09/07/2012   Procedure: PERCUTANEOUS CORONARY STENT INTERVENTION (PCI-S);  Surgeon: Troy Sine, MD;  Location: [name] CATH LAB;  Service: Cardiovascular;  Laterality: N/A;  . PORT-A-CATH REMOVAL N/A  10/14/2013   Procedure: REMOVAL PORT-A-CATH;  Surgeon: Rolm Bookbinder, MD;  Location: [facility];  Service: General;  Laterality: N/A;  . PORTACATH PLACEMENT Right 02/17/2013   Procedure: INSERTION PORT-A-CATH;  Surgeon: Rolm Bookbinder, MD;  Location: [facility];  Service: General;  Laterality: Right;  . THORACENTESIS     12/13/2018 atypical cells not definitely diag. of malignancy      Social History:  The patient  reports that she has never smoked. She has never used smokeless tobacco. She reports previous alcohol use. She reports that she does not use drugs.   Family History:  The patient's family history includes Asthma in her daughter; COPD in her mother; Diabetes in her brother; Heart disease in her father; Hypertension in her brother, brother, and father; Learning disabilities in her brother.    ROS:  Please see the history of present illness. All other systems are reviewed and  Negative to the above problem except as noted.    PHYSICAL EXAM: VS:  BP 124/72   Pulse 74   Ht _0  (1.651 m)   Wt 132 lb 12.8 oz (60.2 kg)   BMI 22.10 kg/m   GEN: Thin 69 yo  in no acute distress Examined in wheel chair  HEENT: normal  Neck: JVP is increase   Cardiac: RRR; no murmurs, rubs, or gallops,2+ edema  Respiratory:  clear to auscultation bilaterally, normal work of breathing GI: soft, nontender, nondistended, + BS  No hepatomegaly  MS: no deformity Moving all extremities   Skin: warm and dry, no rash Neuro:  Strength and sensation are intact Psych: euthymic mood, full affect   EKG:  EKG is  ordered today.  SR 74 bpm  First degree AV block  PR 212 msec POssible IMI   Anteroseptal MI  Possible anterior MI    No change from July  2020     Echo  01/02/19 IMPRESSIONS    1. The left ventricle has severely reduced systolic function, with an ejection fraction of 25-30%. The cavity size was moderately dilated. Left ventricular diastolic Doppler parameters are consistent with restrictive  filling.  2. Inferior septal and apical hypokinesis.  3. The right ventricle has normal systolic function. The cavity was normal. There is no increase in right ventricular wall thickness.  4. Left atrial size was mildly dilated.  5. The mitral valve is degenerative. Moderate thickening of the mitral valve leaflet. Mild calcification of the mitral valve leaflet. There is mild mitral annular calcification present.  6. The tricuspid valve is normal in structure.  7. The aortic valve is tricuspid Moderate thickening of the aortic valve Moderate calcification of the aortic valve.  8. The pulmonic valve was grossly normal. Pulmonic valve regurgitation is mild by color flow Doppler.  Lipid Panel    Component Value Date/Time   CHOL 114 06/19/2019 1157   TRIG 270 (H) 06/19/2019 1157   HDL 49 06/19/2019 1157   CHOLHDL 2.3 06/19/2019 1157   CHOLHDL 3.1 06/09/2016 1420   VLDL 23 06/09/2016 1420   LDLCALC 11 06/19/2019 1157      Wt Readings from Last 3 Encounters:  12/29/19 132 lb 12.8 oz (60.2 kg)  11/26/19 110 lb (49.9 kg)  11/04/19 115 lb (52.2 kg)      ASSESSMENT AND PLAN:  1  CAD patient denies angina.  EKG changes are old I am not convinced of active ischemia      2 Systolic CHF volume on exam is elevated.  Will check labs today and decide what to do with her medicines note she could not afford Entresto and did not fill it.  Will need to base further treatment based on her lab results today.  We will set tentative follow-up for 4 weeks time.    3  HTN BP is controlled  Continue current meds   4  HL On statin and zetia   WIll check lipids     5  DM   Last A1C over 13  Check again today   WIll check CBC, BMET, TSH  A1C, BNP, lipid,  Signed, Dorris Carnes, MD  12/29/2019 10:43 AM    [facility] [place], [place], [place]  [address] Phone: [phone]; Fax: [phone]

## 2019-12-28 DIAGNOSIS — J329 Chronic sinusitis, unspecified: Secondary | ICD-10-CM | POA: Insufficient documentation

## 2019-12-28 NOTE — Addendum Note (Signed)
Addended by: Orland Mustard on: 12/28/2019 09:44 PM   Modules accepted: Orders

## 2019-12-29 ENCOUNTER — Other Ambulatory Visit: Payer: Self-pay

## 2019-12-29 ENCOUNTER — Ambulatory Visit (INDEPENDENT_AMBULATORY_CARE_PROVIDER_SITE_OTHER): Payer: Medicare Other | Admitting: Internal Medicine

## 2019-12-29 ENCOUNTER — Encounter: Payer: Self-pay | Admitting: Internal Medicine

## 2019-12-29 VITALS — BP 124/72 | HR 74 | Ht 65.0 in | Wt 132.8 lb

## 2019-12-29 DIAGNOSIS — E785 Hyperlipidemia, unspecified: Secondary | ICD-10-CM | POA: Diagnosis not present

## 2019-12-29 DIAGNOSIS — Z794 Long term (current) use of insulin: Secondary | ICD-10-CM

## 2019-12-29 DIAGNOSIS — I5042 Chronic combined systolic (congestive) and diastolic (congestive) heart failure: Secondary | ICD-10-CM | POA: Diagnosis not present

## 2019-12-29 DIAGNOSIS — I255 Ischemic cardiomyopathy: Secondary | ICD-10-CM

## 2019-12-29 DIAGNOSIS — E1142 Type 2 diabetes mellitus with diabetic polyneuropathy: Secondary | ICD-10-CM | POA: Diagnosis not present

## 2019-12-29 DIAGNOSIS — I10 Essential (primary) hypertension: Secondary | ICD-10-CM

## 2019-12-29 NOTE — Patient Instructions (Signed)
Medication Instructions:  No changes today *If you need a refill on your cardiac medications before your next appointment, please call your pharmacy*   Lab Work: Today: bmet, bnp, cbc, lipids/liver panel, tsh, hgA1c  If you have labs (blood work) drawn today and your tests are completely normal, you will receive your results only by: Marland Kitchen MyChart Message (if you have MyChart) OR . A paper copy in the mail If you have any lab test that is abnormal or we need to change your treatment, we will call you to review the results.   Testing/Procedures: none   Follow-Up: At [facility], you and your health needs are our priority.  As part of our continuing mission to provide you with exceptional heart care, we have created designated Provider Care Teams.  These Care Teams include your primary Cardiologist (physician) and Advanced Practice Providers (APPs -  Physician Assistants and Nurse Practitioners) who all work together to provide you with the care you need, when you need it.  Your next appointment:   4 week(s)  The format for your next appointment:   In Person  Provider:   You may see Dorris Carnes, MD or one of the following Advanced Practice Providers on your designated Care Team:    Richardson Dopp, PA-C  [place], [place]  Daune Perch, [place]

## 2019-12-30 DIAGNOSIS — I420 Dilated cardiomyopathy: Secondary | ICD-10-CM | POA: Diagnosis not present

## 2019-12-30 DIAGNOSIS — I255 Ischemic cardiomyopathy: Secondary | ICD-10-CM | POA: Diagnosis not present

## 2019-12-30 DIAGNOSIS — I2510 Atherosclerotic heart disease of native coronary artery without angina pectoris: Secondary | ICD-10-CM | POA: Diagnosis not present

## 2019-12-30 DIAGNOSIS — I5042 Chronic combined systolic (congestive) and diastolic (congestive) heart failure: Secondary | ICD-10-CM | POA: Diagnosis not present

## 2019-12-30 DIAGNOSIS — E1159 Type 2 diabetes mellitus with other circulatory complications: Secondary | ICD-10-CM | POA: Diagnosis not present

## 2019-12-30 DIAGNOSIS — I110 Hypertensive heart disease with heart failure: Secondary | ICD-10-CM | POA: Diagnosis not present

## 2019-12-30 LAB — CBC
Hematocrit: 43.4 % (ref 34.0–46.6)
Hemoglobin: 14.4 g/dL (ref 11.1–15.9)
MCH: 27.9 pg (ref 26.6–33.0)
MCHC: 33.2 g/dL (ref 31.5–35.7)
MCV: 84 fL (ref 79–97)
Platelets: 196 10*3/uL (ref 150–450)
RBC: 5.17 x10E6/uL (ref 3.77–5.28)
RDW: 14.1 % (ref 11.7–15.4)
WBC: 4.7 10*3/uL (ref 3.4–10.8)

## 2019-12-30 LAB — LIPID PANEL
Chol/HDL Ratio: 2.1 ratio (ref 0.0–4.4)
Cholesterol, Total: 118 mg/dL (ref 100–199)
HDL: 55 mg/dL (ref >39)
LDL Chol Calc (NIH): 45 mg/dL (ref 0–99)
Triglycerides: 95 mg/dL (ref 0–149)
VLDL Cholesterol Cal: 18 mg/dL (ref 5–40)

## 2019-12-30 LAB — BASIC METABOLIC PANEL
BUN/Creatinine Ratio: 24 (ref 12–28)
BUN: 13 mg/dL (ref 8–27)
CO2: 21 mmol/L (ref 20–29)
Calcium: 9.1 mg/dL (ref 8.7–10.3)
Chloride: 99 mmol/L (ref 96–106)
Creatinine, Ser: 0.54 mg/dL — ABNORMAL LOW (ref 0.57–1.00)
GFR calc Af Amer: 111 mL/min/{1.73_m2} (ref >59)
GFR calc non Af Amer: 97 mL/min/{1.73_m2} (ref >59)
Glucose: 438 mg/dL — ABNORMAL HIGH (ref 65–99)
Potassium: 4.3 mmol/L (ref 3.5–5.2)
Sodium: 131 mmol/L — ABNORMAL LOW (ref 134–144)

## 2019-12-30 LAB — HEMOGLOBIN A1C
Est. average glucose Bld gHb Est-mCnc: >398 mg/dL
Hgb A1c MFr Bld: >15.5 % — ABNORMAL HIGH (ref 4.8–5.6)

## 2019-12-30 LAB — HEPATIC FUNCTION PANEL
ALT: 16 IU/L (ref 0–32)
AST: 14 IU/L (ref 0–40)
Albumin: 2.9 g/dL — ABNORMAL LOW (ref 3.8–4.8)
Alkaline Phosphatase: 84 IU/L (ref 39–117)
Bilirubin Total: 0.5 mg/dL (ref 0.0–1.2)
Bilirubin, Direct: 0.18 mg/dL (ref 0.00–0.40)
Total Protein: 5 g/dL — ABNORMAL LOW (ref 6.0–8.5)

## 2019-12-30 LAB — TSH: TSH: 3.1 u[IU]/mL (ref 0.450–4.500)

## 2019-12-30 LAB — PRO B NATRIURETIC PEPTIDE: NT-Pro BNP: 2998 pg/mL — ABNORMAL HIGH (ref 0–301)

## 2019-12-31 ENCOUNTER — Telehealth: Payer: Self-pay | Admitting: *Deleted

## 2019-12-31 DIAGNOSIS — I110 Hypertensive heart disease with heart failure: Secondary | ICD-10-CM | POA: Diagnosis not present

## 2019-12-31 DIAGNOSIS — E1159 Type 2 diabetes mellitus with other circulatory complications: Secondary | ICD-10-CM | POA: Diagnosis not present

## 2019-12-31 DIAGNOSIS — I5042 Chronic combined systolic (congestive) and diastolic (congestive) heart failure: Secondary | ICD-10-CM

## 2019-12-31 DIAGNOSIS — I2510 Atherosclerotic heart disease of native coronary artery without angina pectoris: Secondary | ICD-10-CM | POA: Diagnosis not present

## 2019-12-31 DIAGNOSIS — I255 Ischemic cardiomyopathy: Secondary | ICD-10-CM | POA: Diagnosis not present

## 2019-12-31 DIAGNOSIS — I420 Dilated cardiomyopathy: Secondary | ICD-10-CM | POA: Diagnosis not present

## 2019-12-31 MED ORDER — FUROSEMIDE 40 MG PO TABS: ORAL_TABLET | ORAL | 3 refills | Status: DC

## 2019-12-31 NOTE — Telephone Encounter (Signed)
-----   Message from Fay Records, MD sent at 12/30/2019  5:56 PM EST ----- CBC is normal  Kidney function is OK   Glucose is up   Sodium is a little low  Thyroid function is normal  Hgb A1C is over 15  Needs tighter glucose Will forward to PCP Albumin is down   She needs more protein intake (Ensure if possible or equivalent Lipids are excellent     FLuid is up    She is on 40 lasix every day    I would increase to 80 mg every 3rd day    Repeat BMET and BNP in 2 wks

## 2019-12-31 NOTE — Telephone Encounter (Signed)
I spoke with patient and reviewed lab results and recommendations from Dr Harrington Challenger with her.  Will send new prescription to [facility] in [place].  Patient will come in for lab work on March 17,2021.

## 2020-01-02 DIAGNOSIS — I2510 Atherosclerotic heart disease of native coronary artery without angina pectoris: Secondary | ICD-10-CM | POA: Diagnosis not present

## 2020-01-02 DIAGNOSIS — I5042 Chronic combined systolic (congestive) and diastolic (congestive) heart failure: Secondary | ICD-10-CM | POA: Diagnosis not present

## 2020-01-02 DIAGNOSIS — I110 Hypertensive heart disease with heart failure: Secondary | ICD-10-CM | POA: Diagnosis not present

## 2020-01-02 DIAGNOSIS — I420 Dilated cardiomyopathy: Secondary | ICD-10-CM | POA: Diagnosis not present

## 2020-01-02 DIAGNOSIS — E1159 Type 2 diabetes mellitus with other circulatory complications: Secondary | ICD-10-CM | POA: Diagnosis not present

## 2020-01-02 DIAGNOSIS — I255 Ischemic cardiomyopathy: Secondary | ICD-10-CM | POA: Diagnosis not present

## 2020-01-02 NOTE — Telephone Encounter (Signed)
Do you know where this could have been sent?

## 2020-01-02 NOTE — Telephone Encounter (Signed)
I do not. Paperwork is not in media. Paperwork may still be in your fax pile. If not we will have to ask Dr Olivia Mackie once she returns.

## 2020-01-02 NOTE — Telephone Encounter (Signed)
Pt called inquiry about the lift chair. She is wondering where it was sent to so she can check the status.

## 2020-01-05 NOTE — Telephone Encounter (Addendum)
Please advise , could not find this form on this patient.

## 2020-01-06 DIAGNOSIS — I110 Hypertensive heart disease with heart failure: Secondary | ICD-10-CM | POA: Diagnosis not present

## 2020-01-06 DIAGNOSIS — I420 Dilated cardiomyopathy: Secondary | ICD-10-CM | POA: Diagnosis not present

## 2020-01-06 DIAGNOSIS — E1159 Type 2 diabetes mellitus with other circulatory complications: Secondary | ICD-10-CM | POA: Diagnosis not present

## 2020-01-06 DIAGNOSIS — I5042 Chronic combined systolic (congestive) and diastolic (congestive) heart failure: Secondary | ICD-10-CM | POA: Diagnosis not present

## 2020-01-06 DIAGNOSIS — I255 Ischemic cardiomyopathy: Secondary | ICD-10-CM | POA: Diagnosis not present

## 2020-01-06 DIAGNOSIS — I2510 Atherosclerotic heart disease of native coronary artery without angina pectoris: Secondary | ICD-10-CM | POA: Diagnosis not present

## 2020-01-07 ENCOUNTER — Other Ambulatory Visit: Payer: Self-pay

## 2020-01-07 DIAGNOSIS — I5042 Chronic combined systolic (congestive) and diastolic (congestive) heart failure: Secondary | ICD-10-CM | POA: Diagnosis not present

## 2020-01-07 DIAGNOSIS — E1159 Type 2 diabetes mellitus with other circulatory complications: Secondary | ICD-10-CM | POA: Diagnosis not present

## 2020-01-07 DIAGNOSIS — I2510 Atherosclerotic heart disease of native coronary artery without angina pectoris: Secondary | ICD-10-CM | POA: Diagnosis not present

## 2020-01-07 DIAGNOSIS — I420 Dilated cardiomyopathy: Secondary | ICD-10-CM | POA: Diagnosis not present

## 2020-01-07 DIAGNOSIS — I255 Ischemic cardiomyopathy: Secondary | ICD-10-CM | POA: Diagnosis not present

## 2020-01-07 DIAGNOSIS — Z7409 Other reduced mobility: Secondary | ICD-10-CM | POA: Insufficient documentation

## 2020-01-07 DIAGNOSIS — I110 Hypertensive heart disease with heart failure: Secondary | ICD-10-CM | POA: Diagnosis not present

## 2020-01-07 NOTE — Progress Notes (Signed)
Virtual Visit via Video Note  I connected with Marisa Forbes  on 11/26/19 at  8:40 AM EST by a video enabled telemedicine application and verified that I am speaking with the correct person using two identifiers.  Location patient: home Location provider:work or home office Persons participating in the virtual visit: patient, provider, pts husband and cat  I discussed the limitations of evaluation and management by telemedicine and the availability of in person appointments. The patient expressed understanding and agreed to proceed.   HPI: Lift chair evaluation and physical deconditioning with falls 11/25/19 slipped on her floor at home with non grip socks on now using grip socks nothing hurt did not get injured of hit head. She sleeps on the couch and hard to get up. She has to rock to get up off the leather couch with difficulty and muscle strength in arms and legs weak upper and lower body. She sleeps and sits mostly on the cough and hard to get up. Also had to get up off the raised toilet seat and lifting 6 lbs is hard to do. She has a cane but this is not enough at times and legs shake as she is trying to get up off the couch. She has trouble doing ADLS/IADLS and a cane is not sufficient.  Lift chair will help with activities in the house and getting up from seated positions due to physical deconditioning and reduced strength.   She had husband thinking about a scooter but cost is issue  She had a stroke in 2016, chronic combined CHF, uncontrolled DM, h/o breast cancer, HTN, syncope and she is chronically ill.   ROS: See pertinent positives and negatives per HPI. See above +MSK: reduced strength upper and lower body  Muscle weakness   Past Medical History:  Diagnosis Date  . Arthritis    back  . Balance problems    due to stroke  . Benign head tremor    per pt due to muscle spasm in neck   . Breast cancer ([place]) 12/03/12   a. Triple negative, dx 2014. S/p L lumpectomy; sentinel node bx  negative. S/p chemo with CMF and radiation.  . Cataracts, bilateral   . CHF (congestive heart failure) ([place])    On Monday she said her cardiologist was concerned for possible CHF, but is now improving-per pt.(07/26/15)  . CHF (congestive heart failure) ([place])   . Coronary artery disease    a. STEMI 08/2012: s/p DES to LAD, PTCA to downstream LAD. b. Relook cath same hospitalization: stable post-PCI anatomy with Stable Diag lesions (unlikely cause of resting angina, not good PCI targets), stable RCA lesion (non flow limiting).  . Diabetes mellitus without complication ([place])    Takes insulin and metformin  . Dysplasia of cervix, low grade (CIN 1)    in 02 s/p LEEP   . GERD (gastroesophageal reflux disease)    food related  . History of cancer chemotherapy    last in August 2014  . 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 History of radiation therapy 06/23/2013-08/08/2013   62.4 gray to left breast  . HTN (hypertension)   . Hypercholesteremia   . Insomnia   . Left kidney mass   . LV dysfunction    a. EF 45-50% at time of STEMI 08/2012. b. Echo 04/2013: EF 45-50%, mild focal basal hypertrophy of  septum, grade 1 d/d.  Marland Kitchen Myocardial infarction ([place]) 09/07/12  . Peripheral vision loss    left  . Perirectal abscess    11/2018   . Pleural effusion, bilateral   . Pneumonia   . Pneumonia   . Renal artery stenosis ([facility])    a. 20% L RAS in 08/2012 by cath. b. Duplex 07/2012: >60% L RAS.  . Stroke, embolic ([place])    a. 53/2992 post cath. with residual reduced vision in left eye; noted right occiptal lobe/PCA infarct   . UTI (urinary tract infection)   . UTI (urinary tract infection)   . Vasovagal syncope    a. 04/2013 - echo stable compared to prior EF 45-50%, negative carotid duplex.    Past Surgical History:  Procedure  Laterality Date  . BACK SURGERY     x3, lower back  . BREAST BIOPSY Left 12/03/2012   U/S Core- Malignant  . BREAST LUMPECTOMY Left 01/08/2013  . BREAST LUMPECTOMY WITH NEEDLE LOCALIZATION AND AXILLARY SENTINEL LYMPH NODE BX Left 01/08/2013   Procedure: LEFT BREAST WIRE GUIDED  LUMPECTOMY AND LEFT AXILLARY SENTINEL  NODE BX;  Surgeon: Rolm Bookbinder, MD;  Location: [facility];  Service: General;  Laterality: Left;  . CATARACT EXTRACTION Bilateral   . CERVICAL BIOPSY  W/ LOOP ELECTRODE EXCISION     02/2001  . CORONARY ANGIOPLASTY WITH STENT PLACEMENT    . EYE SURGERY     b/l cataract   . INCISION AND DRAINAGE PERIRECTAL ABSCESS N/A 12/11/2018   Procedure: IRRIGATION AND DEBRIDEMENT PERIRECTAL ABSCESS;  Surgeon: Johnathan Hausen, MD;  Location: [facility];  Service: General;  Laterality: N/A;  . LEFT HEART CATHETERIZATION WITH CORONARY ANGIOGRAM N/A 09/07/2012   Procedure: LEFT HEART CATHETERIZATION WITH CORONARY ANGIOGRAM;  Surgeon: Troy Sine, MD;  Location: [name] CATH LAB;  Service: Cardiovascular;  Laterality: N/A;  . LEFT HEART CATHETERIZATION WITH CORONARY ANGIOGRAM  09/09/2012   Procedure: LEFT HEART CATHETERIZATION WITH CORONARY ANGIOGRAM;  Surgeon: Leonie Man, MD;  Location: [facility] CATH LAB;  Service: Cardiovascular;;  . PERCUTANEOUS CORONARY STENT INTERVENTION (PCI-S) N/A 09/07/2012   Procedure: PERCUTANEOUS CORONARY STENT INTERVENTION (PCI-S);  Surgeon: Troy Sine, MD;  Location[facility] C[facility];  Service: Cardiovascular;  Laterality: N/A;  . PORT-A-CATH REMOVAL N/A 10/14/2013   Procedure: REMOVAL PORT-A-CATH;  Surgeon: Rolm Bookbinder, MD;  Location: [facility];  Service: General;  Laterality: N/A;  . PORTACATH PLACEMENT Right 02/17/2013   Procedure: INSERTION PORT-A-CATH;  Surgeon: Rolm Bookbinder, MD;  Location: [facility];  Service: General;  Laterality: Right;  . THORACENTESIS     12/13/2018 atypical cells not definitely diag. of malignancy     Family History  Problem Relation Age of  Onset  . COPD Mother   . Heart disease Father   . Hypertension Father   . Diabetes Brother   . Hypertension Brother   . Learning disabilities Brother   . Hypertension Brother   . Asthma Daughter   . Cancer Neg Hx   . Hyperlipidemia Neg Hx   . Kidney disease Neg Hx   . Stroke Neg Hx     SOCIAL HX:  Married Retired  Former Optometrist  Patient has 2 children and some college education.  Owns guns, wears seat belt, safe in relationship  1 cat   Current Outpatient Medications:  .  aspirin 81 MG EC tablet, Take 1 tablet (81 mg total) by mouth daily., Disp: 30 tablet, Rfl: 12 .  atorvastatin (LIPITOR) 80 MG tablet, TAKE 1 TABLET  BY MOUTH DAILY., Disp: 90 tablet, Rfl: 3 .  carvedilol (COREG) 25 MG tablet, TAKE 1 TABLET BY MOUTH TWICE A DAY WITH MEALS, Disp: 180 tablet, Rfl: 3 .  ENTRESTO 49-51 MG, TAKE 1 TABLET BY MOUTH 2 (TWO) TIMES DAILY. PLEASE KEEP UPCOMING APPT IN AUGUST WITH DR. ROSS FOR FUTURE REFILLS. THANK YOU, Disp: 180 tablet, Rfl: 3 .  ezetimibe (ZETIA) 10 MG tablet, TAKE 1 TABLET BY MOUTH EVERY DAY, Disp: 90 tablet, Rfl: 3 .  feeding supplement, ENSURE ENLIVE, (ENSURE ENLIVE) LIQD, Take 237 mLs by mouth 2 (two) times daily as needed (If PO intakes of meals are poor)., Disp: 7 Bottle, Rfl: 12 .  glucose blood (ONETOUCH VERIO) test strip, Use TID, Disp: 100 each, Rfl: 12 .  hydrALAZINE (APRESOLINE) 10 MG tablet, Take 1 tablet (10 mg total) by mouth 2 (two) times daily as needed. For BP >130/>80, Disp: 90 tablet, Rfl: 2 .  insulin NPH Human (NOVOLIN N RELION) 100 UNIT/ML injection, Inject 0.15 mLs (15 Units total) into the skin 2 (two) times daily before a meal., Disp: 10 mL, Rfl: 11 .  insulin regular (NOVOLIN R RELION) 100 units/mL injection, Inject 0.1-0.15 mLs (10-15 Units total) into the skin 3 (three) times daily before meals., Disp: 10 mL, Rfl: 11 .  isosorbide mononitrate (IMDUR) 60 MG 24 hr tablet, TAKE 1 TABLET BY MOUTH DAILY., Disp: 90 tablet, Rfl: 3 .  nitroGLYCERIN  (NITROSTAT) 0.4 MG SL tablet, Place 1 tablet (0.4 mg total) under the tongue every 5 (five) minutes as needed for chest pain., Disp: 25 tablet, Rfl: 11 .  polyethylene glycol (MIRALAX / GLYCOLAX) packet, Take 17 g by mouth daily., Disp: 14 each, Rfl: 0 .  spironolactone (ALDACTONE) 25 MG tablet, TAKE 1 TABLET BY MOUTH EVERY DAY (Patient not taking: Reported on 12/29/2019), Disp: 90 tablet, Rfl: 1 .  furosemide (LASIX) 40 MG tablet, Take one tablet by mouth daily. Every third day take 2 tablets, Disp: 125 tablet, Rfl: 3  EXAM:  VITALS per patient if applicable:  GENERAL: alert, oriented, appears well and in no acute distress cachetic and frail  HEENT: atraumatic, conjunttiva clear, no obvious abnormalities on inspection of external nose and ears  NECK: normal movements of the head and neck  LUNGS: on inspection no signs of respiratory distress, breathing rate appears normal, no obvious gross SOB, gasping or wheezing  CV: no obvious cyanosis, +Leg edema b/l legs  MS: rocking to get out of seating position 3-/5 strength b/l upper arms and 3+/5 strength lower body. Muscles in arms/legs shaking as patient is trying to rock to get up off of the couch Normal ROM in both shoulders/upper body on exam  Normal ROM in b/l lower legs on exam She has to use sturdy device like cane or rolling walker to hold on to to get up  Physical deconditioning on exam  Able to shift weight with upper body but weakness present and upper and lower body b/l arms and legs  PSYCH/NEURO: pleasant and cooperative, no obvious depression or anxiety, speech and thought processing grossly intact Slowed abnormal gait when finally up   ASSESSMENT AND PLAN:  Discussed the following assessment and plan:  Mobility impaired Decreased activities of daily living (ADL) - Plan: Ambulatory referral to Home Health Physical deconditioning - Plan: Ambulatory referral to [place], Ambulatory referral to Neurology Impaired  instrumental activities of daily living (IADL) - Plan: Ambulatory referral to [facility] Abnormal gait - Plan: Ambulatory referral to Neurology Weakness  Mobility impaired with use of cane/walker with sob with exertion (multifactorial reasons), reduced muscle strength and weakness   Need for lift chair due to decline in health multifactorial reasons with impaired gait and mobility and ADLs   Mobility evaluation for lift chair with batteries due to chronic medical conditions and declining health She is physically deconditioned.   Patient has mobility limiitations which impairs her ability to perform ADLS/IADLS at home and outside of the home (I.e go to doctor appointments, get up from the toilet bathroom, get around in the kitchen/bedroom at home, check his mail, cook).  Lift chair device and batteries are necessary.   She is already using cane and has used a rolling walker with difficultly due to shortness of breath with exertion, history of falls, physical weakness, making these devices unsafe alone and falls could be detrimental to her health.   A walking device will not resolve the issue and a lift chair will allow the patient to safely perform daily activities and go to appointments and move around in the home.  She does propel herself at home with a cane, walker, and assistance from others when able but alone this is not enough.   Her mental health/memory deem her safe to use device.  She is able to shift weight with his upper body which is weaker and legs weak as well which is why we are requesting a lift chair as above for the aforementioned reasons.   Mobility limitations impair her ability to perform ADLs/IADLS at home and outside of the home as mentioned and lift chair device and batteries are necessary. She is already using a cane/walker with difficulty due to risk of falls, shortness of breath with exertion, decline in health and physical deconditioning.   A walking device  will not resolve the issue and a lift chair will allow the patient to safely perform daily activities.  She does propel herself at home with cane and walker but this is difficult due to the above mentioned reasons. She is currently propelling with the above current walking devices with difficulty.   She has balance issues with walking and at risk for falls so a cane/walker will not medically meet my patients' needs in the home.    Please approve a lift chair with batteries.   Cane/rolling walker will not medically meet my patients mobility needs in the home as she needs a lift chair urgently.   The patient can safely operate the lift chair both mentally and physically and she is willing and motivated to use the power mobility device in the home.     Rx lift chair and batteries R53.81, R53.1, R26.9 Z78.9, Z78.9, E11.65, Z86.73, I11.0, I10 Z86.73, Z74.09   To [facility] [phone] To [facility]  -we discussed possible serious and likely etiologies, options for evaluation and workup, limitations of telemedicine visit vs in person visit, treatment, treatment risks and precautions. Pt prefers to treat via telemedicine empirically rather then risking or undertaking an in person visit at this moment. Patient agrees to seek prompt in person care if worsening, new symptoms arise, or if is not improving with treatment.   I discussed the assessment and treatment plan with the patient. The patient was provided an opportunity to ask questions and all were answered. The patient agreed with the plan and demonstrated an understanding of the instructions.   The patient was advised to call back or seek an in-person evaluation if the symptoms worsen or if the condition fails  to improve as anticipated.  Time spent 10-19 minutes  Delorise Jackson, MD

## 2020-01-08 ENCOUNTER — Telehealth: Payer: Self-pay | Admitting: Internal Medicine

## 2020-01-08 NOTE — Telephone Encounter (Signed)
Noted!  Let's put her on the waiting list.

## 2020-01-08 NOTE — Telephone Encounter (Signed)
-----   Message from Philemon Kingdom, MD sent at 01/08/2020 12:36 PM EST ----- Belenda Cruise, Please see below. This is a f/u pt. that missed her prev. appt and has an appt on 03/23. Thank you, C ----- Message ----- From: McLean-Scocuzza, Nino Glow, MD Sent: 01/08/2020  12:21 PM EST To: Philemon Kingdom, MD  Can you get pt scheduled asap DM uncontrolled A1C >15   Thanks [place]

## 2020-01-08 NOTE — Telephone Encounter (Signed)
Dr Cruzita Lederer I called patient to schedule sooner than 01/20/2020. We had an appointment fir 01/09/2020 but patient was unable to come to that because she had conflicting appointment to get second COVID vaccine.  There were no other days available at this time to schedule her before 01/20/2020

## 2020-01-09 ENCOUNTER — Telehealth: Payer: Self-pay | Admitting: Internal Medicine

## 2020-01-09 NOTE — Telephone Encounter (Signed)
Added to wait list.

## 2020-01-09 NOTE — Telephone Encounter (Signed)
[name] [name] [phone]  Called to report missed visit today. She said pt told her she was getting second covid vaccine today and had some other errands to do.

## 2020-01-09 NOTE — Telephone Encounter (Signed)
Calling to inform the patient that orders for the lift char and Rolator were faxed to both [facility] and [facility]. Patient can check with both companies to see which one is more affordable.

## 2020-01-09 NOTE — Telephone Encounter (Signed)
Noted  For your information   

## 2020-01-11 DIAGNOSIS — Z794 Long term (current) use of insulin: Secondary | ICD-10-CM | POA: Diagnosis not present

## 2020-01-11 DIAGNOSIS — I255 Ischemic cardiomyopathy: Secondary | ICD-10-CM | POA: Diagnosis not present

## 2020-01-11 DIAGNOSIS — I420 Dilated cardiomyopathy: Secondary | ICD-10-CM | POA: Diagnosis not present

## 2020-01-11 DIAGNOSIS — H547 Unspecified visual loss: Secondary | ICD-10-CM | POA: Diagnosis not present

## 2020-01-11 DIAGNOSIS — Z7982 Long term (current) use of aspirin: Secondary | ICD-10-CM | POA: Diagnosis not present

## 2020-01-11 DIAGNOSIS — H9191 Unspecified hearing loss, right ear: Secondary | ICD-10-CM | POA: Diagnosis not present

## 2020-01-11 DIAGNOSIS — Z8673 Personal history of transient ischemic attack (TIA), and cerebral infarction without residual deficits: Secondary | ICD-10-CM | POA: Diagnosis not present

## 2020-01-11 DIAGNOSIS — I110 Hypertensive heart disease with heart failure: Secondary | ICD-10-CM | POA: Diagnosis not present

## 2020-01-11 DIAGNOSIS — K219 Gastro-esophageal reflux disease without esophagitis: Secondary | ICD-10-CM | POA: Diagnosis not present

## 2020-01-11 DIAGNOSIS — E1159 Type 2 diabetes mellitus with other circulatory complications: Secondary | ICD-10-CM | POA: Diagnosis not present

## 2020-01-11 DIAGNOSIS — M479 Spondylosis, unspecified: Secondary | ICD-10-CM | POA: Diagnosis not present

## 2020-01-11 DIAGNOSIS — Z853 Personal history of malignant neoplasm of breast: Secondary | ICD-10-CM | POA: Diagnosis not present

## 2020-01-11 DIAGNOSIS — I5042 Chronic combined systolic (congestive) and diastolic (congestive) heart failure: Secondary | ICD-10-CM | POA: Diagnosis not present

## 2020-01-11 DIAGNOSIS — Z9181 History of falling: Secondary | ICD-10-CM | POA: Diagnosis not present

## 2020-01-11 DIAGNOSIS — G4700 Insomnia, unspecified: Secondary | ICD-10-CM | POA: Diagnosis not present

## 2020-01-11 DIAGNOSIS — I2510 Atherosclerotic heart disease of native coronary artery without angina pectoris: Secondary | ICD-10-CM | POA: Diagnosis not present

## 2020-01-11 DIAGNOSIS — E559 Vitamin D deficiency, unspecified: Secondary | ICD-10-CM | POA: Diagnosis not present

## 2020-01-12 DIAGNOSIS — I110 Hypertensive heart disease with heart failure: Secondary | ICD-10-CM | POA: Diagnosis not present

## 2020-01-12 DIAGNOSIS — I255 Ischemic cardiomyopathy: Secondary | ICD-10-CM | POA: Diagnosis not present

## 2020-01-12 DIAGNOSIS — I5042 Chronic combined systolic (congestive) and diastolic (congestive) heart failure: Secondary | ICD-10-CM | POA: Diagnosis not present

## 2020-01-12 DIAGNOSIS — E1159 Type 2 diabetes mellitus with other circulatory complications: Secondary | ICD-10-CM | POA: Diagnosis not present

## 2020-01-12 DIAGNOSIS — I2510 Atherosclerotic heart disease of native coronary artery without angina pectoris: Secondary | ICD-10-CM | POA: Diagnosis not present

## 2020-01-12 DIAGNOSIS — I420 Dilated cardiomyopathy: Secondary | ICD-10-CM | POA: Diagnosis not present

## 2020-01-12 NOTE — Telephone Encounter (Signed)
Patient informed and verbalized understanding.  She will check out both places.

## 2020-01-13 ENCOUNTER — Telehealth: Payer: Self-pay | Admitting: Internal Medicine

## 2020-01-13 NOTE — Telephone Encounter (Signed)
[facility] called and would like to send a certificate Medical Necessity form for a lift chair for the patient. She said they do not have a fax machine at the location to send it. She wants to email it someone. I let her know we usually don't do this and I would have to ask. She would like a call back [phone] ext [phone].

## 2020-01-13 NOTE — Telephone Encounter (Signed)
Left detailed voicemail at this extension including our fax number and my work e-mail to send form

## 2020-01-14 ENCOUNTER — Other Ambulatory Visit: Payer: Medicare Other | Admitting: *Deleted

## 2020-01-14 ENCOUNTER — Telehealth: Payer: Self-pay | Admitting: Medical

## 2020-01-14 ENCOUNTER — Other Ambulatory Visit: Payer: Self-pay

## 2020-01-14 DIAGNOSIS — H6123 Impacted cerumen, bilateral: Secondary | ICD-10-CM | POA: Diagnosis not present

## 2020-01-14 DIAGNOSIS — H903 Sensorineural hearing loss, bilateral: Secondary | ICD-10-CM | POA: Diagnosis not present

## 2020-01-14 DIAGNOSIS — I5042 Chronic combined systolic (congestive) and diastolic (congestive) heart failure: Secondary | ICD-10-CM | POA: Diagnosis not present

## 2020-01-14 DIAGNOSIS — K118 Other diseases of salivary glands: Secondary | ICD-10-CM | POA: Diagnosis not present

## 2020-01-14 NOTE — Telephone Encounter (Signed)
Paged about a critical lab value. Called back and was informed of glucose of 661. Called the patient and she said she ate before the lab work. Her BG this AM was 170 which was normal for her. Last A1C was >15. Patient was otherwise feeling her normal self. She takes her diabetes medications daily. Looks like she has a enocrine f/u coming up on 3/23.   Merrin Mcvicker Kathlen Mody, PA-C

## 2020-01-15 DIAGNOSIS — I420 Dilated cardiomyopathy: Secondary | ICD-10-CM | POA: Diagnosis not present

## 2020-01-15 DIAGNOSIS — I255 Ischemic cardiomyopathy: Secondary | ICD-10-CM | POA: Diagnosis not present

## 2020-01-15 DIAGNOSIS — E1159 Type 2 diabetes mellitus with other circulatory complications: Secondary | ICD-10-CM | POA: Diagnosis not present

## 2020-01-15 DIAGNOSIS — I5042 Chronic combined systolic (congestive) and diastolic (congestive) heart failure: Secondary | ICD-10-CM | POA: Diagnosis not present

## 2020-01-15 DIAGNOSIS — I110 Hypertensive heart disease with heart failure: Secondary | ICD-10-CM | POA: Diagnosis not present

## 2020-01-15 DIAGNOSIS — I2510 Atherosclerotic heart disease of native coronary artery without angina pectoris: Secondary | ICD-10-CM | POA: Diagnosis not present

## 2020-01-15 LAB — BASIC METABOLIC PANEL
BUN/Creatinine Ratio: 21 (ref 12–28)
BUN: 15 mg/dL (ref 8–27)
CO2: 25 mmol/L (ref 20–29)
Calcium: 8.9 mg/dL (ref 8.7–10.3)
Chloride: 92 mmol/L — ABNORMAL LOW (ref 96–106)
Creatinine, Ser: 0.71 mg/dL (ref 0.57–1.00)
GFR calc Af Amer: 100 mL/min/{1.73_m2} (ref >59)
GFR calc non Af Amer: 87 mL/min/{1.73_m2} (ref >59)
Glucose: 661 mg/dL (ref 65–99)
Potassium: 4.5 mmol/L (ref 3.5–5.2)
Sodium: 129 mmol/L — ABNORMAL LOW (ref 134–144)

## 2020-01-15 LAB — PRO B NATRIURETIC PEPTIDE: NT-Pro BNP: 5532 pg/mL — ABNORMAL HIGH (ref 0–301)

## 2020-01-18 ENCOUNTER — Other Ambulatory Visit: Payer: Self-pay | Admitting: Internal Medicine

## 2020-01-19 ENCOUNTER — Telehealth: Payer: Self-pay

## 2020-01-19 DIAGNOSIS — Z79899 Other long term (current) drug therapy: Secondary | ICD-10-CM

## 2020-01-19 DIAGNOSIS — I420 Dilated cardiomyopathy: Secondary | ICD-10-CM | POA: Diagnosis not present

## 2020-01-19 DIAGNOSIS — I5022 Chronic systolic (congestive) heart failure: Secondary | ICD-10-CM

## 2020-01-19 DIAGNOSIS — E1142 Type 2 diabetes mellitus with diabetic polyneuropathy: Secondary | ICD-10-CM

## 2020-01-19 DIAGNOSIS — I5042 Chronic combined systolic (congestive) and diastolic (congestive) heart failure: Secondary | ICD-10-CM

## 2020-01-19 DIAGNOSIS — E1159 Type 2 diabetes mellitus with other circulatory complications: Secondary | ICD-10-CM | POA: Diagnosis not present

## 2020-01-19 DIAGNOSIS — I2510 Atherosclerotic heart disease of native coronary artery without angina pectoris: Secondary | ICD-10-CM | POA: Diagnosis not present

## 2020-01-19 DIAGNOSIS — I110 Hypertensive heart disease with heart failure: Secondary | ICD-10-CM | POA: Diagnosis not present

## 2020-01-19 DIAGNOSIS — I10 Essential (primary) hypertension: Secondary | ICD-10-CM

## 2020-01-19 DIAGNOSIS — I255 Ischemic cardiomyopathy: Secondary | ICD-10-CM | POA: Diagnosis not present

## 2020-01-19 MED ORDER — FUROSEMIDE 80 MG PO TABS: 80.0000 mg | ORAL_TABLET | Freq: Every day | ORAL | 3 refills | Status: DC

## 2020-01-19 NOTE — Telephone Encounter (Signed)
Pt verbalized understanding of DR. Harrington Challenger' recommendations. Will return 01/27/20 for repeat labs.

## 2020-01-19 NOTE — Telephone Encounter (Signed)
-----   Message from Fay Records, MD sent at 01/15/2020 12:55 PM EDT ----- FLuid is up more than a couple wks ago    Kidney function is OK    I would incrase lasix to 80 mg daily    F?U BMET and BNP in 1 wk Low salt   Weigh daily   1500 cc fluid per day

## 2020-01-20 ENCOUNTER — Encounter: Payer: Self-pay | Admitting: Internal Medicine

## 2020-01-20 ENCOUNTER — Encounter: Payer: Self-pay | Admitting: Neurology

## 2020-01-20 ENCOUNTER — Other Ambulatory Visit: Payer: Self-pay

## 2020-01-20 ENCOUNTER — Ambulatory Visit: Payer: Self-pay | Admitting: Diagnostic Neuroimaging

## 2020-01-20 ENCOUNTER — Ambulatory Visit (INDEPENDENT_AMBULATORY_CARE_PROVIDER_SITE_OTHER): Payer: Medicare Other | Admitting: Internal Medicine

## 2020-01-20 ENCOUNTER — Telehealth: Payer: Self-pay | Admitting: Internal Medicine

## 2020-01-20 ENCOUNTER — Ambulatory Visit (INDEPENDENT_AMBULATORY_CARE_PROVIDER_SITE_OTHER): Payer: Medicare Other | Admitting: Neurology

## 2020-01-20 VITALS — BP 172/98 | HR 81 | Temp 97.5°F | Ht 65.0 in | Wt 138.0 lb

## 2020-01-20 VITALS — BP 160/98 | HR 77 | Ht 65.0 in | Wt 143.0 lb

## 2020-01-20 DIAGNOSIS — E1165 Type 2 diabetes mellitus with hyperglycemia: Secondary | ICD-10-CM | POA: Diagnosis not present

## 2020-01-20 DIAGNOSIS — I255 Ischemic cardiomyopathy: Secondary | ICD-10-CM

## 2020-01-20 DIAGNOSIS — R531 Weakness: Secondary | ICD-10-CM | POA: Diagnosis not present

## 2020-01-20 DIAGNOSIS — G243 Spasmodic torticollis: Secondary | ICD-10-CM | POA: Diagnosis not present

## 2020-01-20 DIAGNOSIS — E785 Hyperlipidemia, unspecified: Secondary | ICD-10-CM

## 2020-01-20 DIAGNOSIS — I639 Cerebral infarction, unspecified: Secondary | ICD-10-CM | POA: Diagnosis not present

## 2020-01-20 DIAGNOSIS — R269 Unspecified abnormalities of gait and mobility: Secondary | ICD-10-CM | POA: Diagnosis not present

## 2020-01-20 DIAGNOSIS — E1159 Type 2 diabetes mellitus with other circulatory complications: Secondary | ICD-10-CM

## 2020-01-20 NOTE — Patient Instructions (Signed)
Please change: Insulin Before breakfast Before lunch Before dinner  Regular (R) - clear 10-15 10-15 10-15  NPH (N) - cloudy 15 15 15   Please inject the insulin 30 min before meals.  Please return in 1.5 months with your sugar log.

## 2020-01-20 NOTE — Progress Notes (Signed)
Patient requested to have her CBG checked at visit today. Our clinic meter only reads up to 600. Patient glucose was too high for our meter to read.  Dr. Cruzita Lederer notified.

## 2020-01-20 NOTE — Telephone Encounter (Signed)
Pt is noncompliant with BP meds and entresto Can you help?

## 2020-01-20 NOTE — Progress Notes (Signed)
PATIENT: Marisa Forbes DOB: 1951-06-28  Chief Complaint  Patient presents with  . Multiple Health Concerns    She is here with her husband, [name]. History of CVA. Referred for the following concerns: physical deconditioning, abnormal gait, changes in vision, decreaed hearing of right ear, weakness, swelling in legs.  Marland Kitchen PCP    McLean-Scocuzza, Nino Glow, MD     HISTORICAL  Marisa Forbes is a 69 year old female, seen in request by her primary care doctor McLean-Scocuzza, Nino Glow for evaluation of worsening gait abnormality, had a history of stroke, initial evaluation was on January 20, 2020.  I have reviewed and summarized the referring note from the referring physician.  She had past medical history of hypertension, hyperlipidemia, diabetes, insulin-dependent, poorly controlled, most recent A1c was more than 15.5, she administered insulin shots to herself, often missing her insulin dose.  She suffered stroke in 2014.  She presented with sudden onset left visual field deficit, continue have left-sided visual difficulty, also had mild residual left upper lower extremity weakness, but he was functioning well, able to cook for Thanksgiving dinner in November 2020, had steady decline since then,  She was noted to have increased bilateral lower extremity swelling, weakness, difficulty getting up from seated position, rely on her husband to pick her up, then has unsteady gait,  She has longstanding history of head titubation, gradually getting worse,  I personally reviewed MRI of the brain with and without contrast on December 22, 2019, no acute abnormality, old right occipital infarction, multiple scattered old lacunar infarction, moderate supratentorium small vessel disease, 13 mm left parotid lesion with peripherally irregular contrast-enhancement, unchanged from previous MRI in June 2016, chronic sinusitis, mostly at the left sphenoid, suggest inspissated secretion  Laboratory evaluations  in 2021 showed normal lipid panel, BMP showed elevated glucose seconds 161, creatinine of 0.71, sodium of 129, chloride of 92, A1c was more than 15.5, normal liver functional test, other than decreased albumin, total protein, normal TSH  Echocardiogram in March 2020 showed severely reduced systolic function, with ejection fraction of 25 to 30%, left ventricle cavity size was moderately dilated, inferior septal and apical hypokinesis.  REVIEW OF SYSTEMS: Full 14 system review of systems performed and notable only for as above All other review of systems were negative.  ALLERGIES: No Known Allergies  HOME MEDICATIONS: Current Outpatient Medications  Medication Sig Dispense Refill  . aspirin 81 MG EC tablet Take 1 tablet (81 mg total) by mouth daily. 30 tablet 12  . atorvastatin (LIPITOR) 80 MG tablet TAKE 1 TABLET BY MOUTH DAILY. 90 tablet 3  . carvedilol (COREG) 25 MG tablet TAKE 1 TABLET BY MOUTH TWICE A DAY WITH MEALS 180 tablet 3  . ENTRESTO 49-51 MG TAKE 1 TABLET BY MOUTH 2 (TWO) TIMES DAILY. PLEASE KEEP UPCOMING APPT IN AUGUST WITH DR. ROSS FOR FUTURE REFILLS. THANK YOU 180 tablet 3  . ezetimibe (ZETIA) 10 MG tablet TAKE 1 TABLET BY MOUTH EVERY DAY 90 tablet 3  . feeding supplement, ENSURE ENLIVE, (ENSURE ENLIVE) LIQD Take 237 mLs by mouth 2 (two) times daily as needed (If PO intakes of meals are poor). 7 Bottle 12  . furosemide (LASIX) 80 MG tablet Take 1 tablet (80 mg total) by mouth daily. 90 tablet 3  . glucose blood (ONETOUCH VERIO) test strip Use TID 100 each 12  . hydrALAZINE (APRESOLINE) 10 MG tablet Take 1 tablet (10 mg total) by mouth 2 (two) times daily as needed. For BP >130/>80 90  tablet 2  . insulin NPH Human (NOVOLIN N RELION) 100 UNIT/ML injection Inject 0.15 mLs (15 Units total) into the skin 2 (two) times daily before a meal. 10 mL 11  . insulin regular (NOVOLIN R RELION) 100 units/mL injection Inject 0.1-0.15 mLs (10-15 Units total) into the skin 3 (three) times daily  before meals. 10 mL 11  . isosorbide mononitrate (IMDUR) 60 MG 24 hr tablet TAKE 1 TABLET BY MOUTH DAILY. 90 tablet 3  . nitroGLYCERIN (NITROSTAT) 0.4 MG SL tablet Place 1 tablet (0.4 mg total) under the tongue every 5 (five) minutes as needed for chest pain. 25 tablet 11  . polyethylene glycol (MIRALAX / GLYCOLAX) packet Take 17 g by mouth daily. 14 each 0  . spironolactone (ALDACTONE) 25 MG tablet TAKE 1 TABLET BY MOUTH EVERY DAY 90 tablet 3   No current facility-administered medications for this visit.    PAST MEDICAL HISTORY: Past Medical History:  Diagnosis Date  . Arthritis    back  . Balance problems    due to stroke  . Benign head tremor    per pt due to muscle spasm in neck   . Breast cancer ([place]) 12/03/12   a. 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 S/p chemo with CMF and radiation.  . Cataracts, bilateral   . CHF (congestive heart failure) ([place])    On Monday she said her cardiologist was concerned for possible CHF, but is now improving-per pt.(07/26/15)  . CHF (congestive heart failure) ([place])   . Coronary artery disease    a. STEMI 08/2012: s/p DES to LAD, PTCA to downstream LAD. b. Relook cath same hospitalization: stable post-PCI anatomy with Stable Diag lesions (unlikely cause of resting angina, not good PCI targets), stable RCA lesion (non flow limiting).  . Diabetes mellitus without complication ([place])    Takes insulin and metformin  . Dysplasia of cervix, low grade (CIN 1)    in 02 s/p LEEP   . GERD (gastroesophageal reflux disease)    food related  . History of cancer chemotherapy    last in August 2014  . 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 History of radiation therapy 06/23/2013-08/08/2013   62.4 gray to left breast  . HTN (hypertension)   .  Hypercholesteremia   . Insomnia   . Left kidney mass   . LV dysfunction    a. EF 45-50% at time of STEMI 08/2012. b. Echo 04/2013: EF 45-50%, mild focal basal hypertrophy of septum, grade 1 d/d.  Marland Kitchen Myocardial infarction ([facility]) 09/07/12  . Peripheral vision loss    left  . Perirectal abscess    11/2018   . Pleural effusion, bilateral   . Pneumonia   . Pneumonia   . Renal artery stenosis ([facility])    a. 20% L RAS in 08/2012 by cath. b. Duplex 07/2012: >60% L RAS.  . Stroke, embolic ([place])    a. 64/6803 post cath. with residual reduced vision in left eye; noted right occiptal lobe/PCA infarct   . UTI (urinary tract infection)   . UTI (urinary tract infection)   . Vasovagal syncope    a. 04/2013 - echo stable compared to prior EF 45-50%, negative carotid duplex.    PAST SURGICAL HISTORY: Past Surgical History:  Procedure Laterality Date  . BACK SURGERY     x3,  lower back  . BREAST BIOPSY Left 12/03/2012   U/S Core- Malignant  . BREAST LUMPECTOMY Left 01/08/2013  . BREAST LUMPECTOMY WITH NEEDLE LOCALIZATION AND AXILLARY SENTINEL LYMPH NODE BX Left 01/08/2013   Procedure: LEFT BREAST WIRE GUIDED  LUMPECTOMY AND LEFT AXILLARY SENTINEL  NODE BX;  Surgeon: Rolm Bookbinder, MD;  Location: [facility];  Service: General;  Laterality: Left;  . CATARACT EXTRACTION Bilateral   . CERVICAL BIOPSY  W/ LOOP ELECTRODE EXCISION     02/2001  . CORONARY ANGIOPLASTY WITH STENT PLACEMENT    . EYE SURGERY     b/l cataract   . INCISION AND DRAINAGE PERIRECTAL ABSCESS N/A 12/11/2018   Procedure: IRRIGATION AND DEBRIDEMENT PERIRECTAL ABSCESS;  Surgeon: Johnathan Hausen, MD;  Location: [facility];  Service: General;  Laterality: N/A;  . LEFT HEART CATHETERIZATION WITH CORONARY ANGIOGRAM N/A 09/07/2012   Procedure: LEFT HEART CATHETERIZATION WITH CORONARY ANGIOGRAM;  Surgeon: Troy Sine, MD;  [facility] C[facility];  Service: Cardiovascular;  Laterality: N/A;  . LEFT HEART CATHETERIZATION WITH CORONARY ANGIOGRAM   09/09/2012   Procedure: LEFT HEART CATHETERIZATION WITH CORONARY ANGIOGRAM;  Surgeon: Leonie Man, MD;  Location: [facility] CATH LAB;  Service: Cardiovascular;;  . PERCUTANEOUS CORONARY STENT INTERVENTION (PCI-S) N/A 09/07/2012   Procedure: PERCUTANEOUS CORONARY STENT INTERVENTION (PCI-S);  Surgeon: Troy Sine, MD;  Location: [facility] CATH [facility];  Service: Cardiovascular;  Laterality: N/A;  . PORT-A-CATH REMOVAL N/A 10/14/2013   Procedure: REMOVAL PORT-A-CATH;  Surgeon: Rolm Bookbinder, MD;  Location: [facility];  Service: General;  Laterality: N/A;  . PORTACATH PLACEMENT Right 02/17/2013   Procedure: INSERTION PORT-A-CATH;  Surgeon: Rolm Bookbinder, MD;  Location: [address];  Service: General;  Laterality: Right;  . THORACENTESIS     12/13/2018 atypical cells not definitely diag. of malignancy     FAMILY HISTORY: Family History  Problem Relation Age of Onset  . COPD Mother   . Heart disease Father   . Hypertension Father   . Diabetes Brother   . Hypertension Brother   . Learning disabilities Brother   . Hypertension Brother   . Asthma Daughter   . Cancer Neg Hx   . Hyperlipidemia Neg Hx   . Kidney disease Neg Hx   . Stroke Neg Hx     SOCIAL HISTORY: Social History   Socioeconomic History  . Marital status: Married    Spouse name: [name]  . Number of children: 2  . Years of education: college  . Highest education level: Not on file  Occupational History    Employer: [employer]  Tobacco Use  . Smoking status: Never Smoker  . Smokeless tobacco: Never Used  Substance and Sexual Activity  . Alcohol use: Not Currently  . Drug use: No  . Sexual activity: Yes  Other Topics Concern  . Not on file  Social History Narrative   Married   Retired    Former Optometrist    Patient has 2 children and some college education.    Owns guns, wears seat belt, safe in relationship    1 cat   Right-handed.   Caffeine use: 2 liters of soda and 2-3 cups coffee per day.   Social  Determinants of Health   Financial Resource Strain:   . Difficulty of Paying Living Expenses:   Food Insecurity:   . Worried About Charity fundraiser in the Last Year:   . Arboriculturist in the Last Year:   Transportation Needs:   .  Lack of Transportation (Medical):   Marland Kitchen Lack of Transportation (Non-Medical):   Physical Activity:   . Days of Exercise per Week:   . Minutes of Exercise per Session:   Stress:   . Feeling of Stress :   Social Connections:   . Frequency of Communication with Friends and Family:   . Frequency of Social Gatherings with Friends and Family:   . Attends Religious Services:   . Active Member of Clubs or Organizations:   . Attends Archivist Meetings:   Marland Kitchen Marital Status:   Intimate Partner Violence:   . Fear of Current or Ex-Partner:   . Emotionally Abused:   Marland Kitchen Physically Abused:   . Sexually Abused:      PHYSICAL EXAM   Vitals:   01/20/20 1426  BP: (!) 172/98  Pulse: 81  Temp: (!) 97.5 F (36.4 C)  Weight: 138 lb (62.6 kg)  Height: _0  (1.651 m)    Not recorded      Body mass index is 22.96 kg/m.  PHYSICAL EXAMNIATION:  Gen: NAD, conversant, well nourised, well groomed                     Cardiovascular: Regular rate rhythm, no peripheral edema, warm, nontender. Eyes: Conjunctivae clear without exudates or hemorrhage Neck: Supple, no carotid bruits. Pulmonary: Clear to auscultation bilaterally   NEUROLOGICAL EXAM:  MENTAL STATUS: Fragile looking elderly female, constant no-no head shaking, tendency to holding her neck forward, Speech:    Speech is normal; fluent and spontaneous with normal comprehension.  Cognition:     Orientation to time, place and person     Normal recent and remote memory     Normal Attention span and concentration     Normal Language, naming, repeating,spontaneous speech     Fund of knowledge   CRANIAL NERVES: CN II: Left visual field deficit pupils are round equal and briskly reactive to  light. CN III, IV, VI: extraocular movement are normal. No ptosis. CN V: Facial sensation is intact to light touch CN VII: Face is symmetric with normal eye closure  CN VIII: Hearing is normal to causal conversation. CN IX, X: Phonation is normal. CN XI: Head turning and shoulder shrug are intact  MOTOR: Fixation of left upper extremity on rapid rotating movement, mild left leg proximal weakness   REFLEXES: Reflexes are 1 and symmetric at the biceps, triceps, knees, and absent at ankles. Plantar responses are extensor on the left side  SENSORY: Bilateral lower extremity pitting edema, length dependent decreased light touch, pinprick to mid shin level  COORDINATION: There is no trunk or limb dysmetria noted.  GAIT/STANCE: She needs to rely on her husband to pick her up to get up from seated position, difficulty initiate gait on the left leg, wide-based, unsteady   DIAGNOSTIC DATA (LABS, IMAGING, TESTING) - I reviewed patient records, labs, notes, testing and imaging myself where available.   ASSESSMENT AND PLAN  TYREONNA CZAPLICKI is a 70 y.o. female   History of right occipital stroke  She has vascular risk factor of poorly controlled diabetes, hypertension, hyperlipidemia  She needs to work closely with her primary care to have better control of her diabetes, Worsening gait abnormality  Multifactorial, history of stroke, deconditioning, congestive heart failure,  CPK level to rule out intrinsic muscle disease  Home physical therapy    Marcial Pacas, M.D. Ph.D.  [facility] [address], [place], [place] [address] Ph: [phone]  Fax: [phone]  CC: McLean-Scocuzza, Nino Glow, MD

## 2020-01-20 NOTE — Progress Notes (Signed)
Patient ID: Marisa Forbes, female   DOB: 06-06-1951, 69 y.o.   MRN: [id number]   This visit occurred during the SARS-CoV-2 public health emergency.  Safety protocols were in place, including screening questions prior to the visit, additional usage of staff PPE, and extensive cleaning of exam room while observing appropriate contact time as indicated for disinfecting solutions.   HPI: Marisa Forbes is a 68 y.o.-year-old female, initially referred by her PCP, Dr. Terese Door, returning for follow-up for DM2, dx in 08/2012, insulin-dependent since dx, uncontrolled, with long-term complications (CHF, CAD, history of stroke, peripheral neuropathy).  Last visit 2 months ago. She is here with her husband, who offers part of the hx, regarding CBG, patient's habits, medications, or history and previous past medical history.  Since last visit, she saw ENT for parotid gland mass.  She also had a very high glucose level, at 661 on BMP on 01/14/2020.  HbA1c at that time was undetectably high.  Reviewed HbA1c levels: Lab Results  Component Value Date   HGBA1C >15.5 (H) 12/29/2019   HGBA1C 14.1 (A) 11/04/2019   HGBA1C 14.8 (H) 06/19/2019   HGBA1C 13.8 (H) 12/11/2018   HGBA1C >15.5 (H) 12/23/2016   HGBA1C 10.8 (H) 06/01/2015   HGBA1C 13.2 (H) 04/12/2015   HGBA1C 7.1 (H) 06/26/2013   HGBA1C 13.6 (H) 09/07/2012   Pt was on a regimen of: - Lantus 18 units at bedtime - Humalog 10-12 units 3x a day, before meals However, she could not afford the analog insulins.  At last visit she was only on: - R insulin sliding scale - 1-20 units - uses this only for correction, not for meal coverage  We changed to the following regimen: mentions that she is not missing many doses but husband mentions she misses many of them Insulin Before breakfast Before lunch Before dinner  Regular (R) - clear 10-15 10-15 10-15  NPH (N) - cloudy 10-15 10-15 occasionally 10-15  Please inject the insulin 30 min before  meals.  Pt checks her sugars 2-3 times a day: - am: 200-300 >> 200-500 - 2h after b'fast: n/c - before lunch: n/c >> ? - 2h after lunch: 200-300s >> n/c - before dinner: n/c - 2h after dinner: n/c - bedtime: 200-500 >> ? - nighttime: n/c Lowest sugar was 110 >> 200; she has hypoglycemia awareness in the 70s.  Highest sugar was 500s >> 600s.  Glucometer: ReliOn  Pt's meals are: - Breakfast: oatmeal - Lunch: sandwich - Dinner: meat, veggies - Snacks: 2 - sugar free cookies, applesauce  -No CKD, last BUN/creatinine:  Lab Results  Component Value Date   BUN 15 01/14/2020   BUN 13 12/29/2019   CREATININE 0.71 01/14/2020   CREATININE 0.54 (L) 12/29/2019  She had to come off Entresto as this was too expensive.  -+ HL;last set of lipids: Lab Results  Component Value Date   CHOL 118 12/29/2019   HDL 55 12/29/2019   LDLCALC 45 12/29/2019   TRIG 95 12/29/2019   CHOLHDL 2.1 12/29/2019  On Lipitor 80, Zetia 10.  - last eye exam was in 2020: No DR  On ASA 81.  -No numbness and tingling in her feet.  Pt has no FH of DM.  She also has a history of HTN, history of breast cancer.  ROS: Constitutional: no weight gain/no weight loss, + fatigue, no subjective hyperthermia, no subjective hypothermia, + nocturia Eyes: no blurry vision, no xerophthalmia ENT: no sore throat, no nodules palpated in neck, no  dysphagia, no odynophagia, no hoarseness Cardiovascular: no CP/no SOB/no palpitations/+ leg swelling Respiratory: no cough/no SOB/no wheezing Gastrointestinal: no N/no V/no D/+ C/no acid reflux Musculoskeletal: + muscle aches/+ joint aches Skin: no rashes, no hair loss Neurological: no tremors/no numbness/no tingling/no dizziness  I reviewed pt's medications, allergies, PMH, social hx, family hx, and changes were documented in the history of present illness. Otherwise, unchanged from my initial visit note.  Past Medical History:  Diagnosis Date  . Arthritis    back  .  Balance problems    due to stroke  . Benign head tremor    per pt due to muscle spasm in neck   . Breast cancer ([place]) 12/03/12   a. 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 S/p chemo with CMF and radiation.  . Cataracts, bilateral   . CHF (congestive heart failure) ([place])    On Monday she said her cardiologist was concerned for possible CHF, but is now improving-per pt.(07/26/15)  . CHF (congestive heart failure) ([place])   . Coronary artery disease    a. STEMI 08/2012: s/p DES to LAD, PTCA to downstream LAD. b. Relook cath same hospitalization: stable post-PCI anatomy with Stable Diag lesions (unlikely cause of resting angina, not good PCI targets), stable RCA lesion (non flow limiting).  . Diabetes mellitus without complication ([place])    Takes insulin and metformin  . Dysplasia of cervix, low grade (CIN 1)    in 02 s/p LEEP   . GERD (gastroesophageal reflux disease)    food related  . History of cancer chemotherapy    last in August 2014  . 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 History of radiation therapy 06/23/2013-08/08/2013   62.4 gray to left breast  . HTN (hypertension)   . Hypercholesteremia   . Insomnia   . Left kidney mass   . LV dysfunction    a. EF 45-50% at time of STEMI 08/2012. b. Echo 04/2013: EF 45-50%, mild focal basal hypertrophy of septum, grade 1 d/d.  Marland Kitchen Myocardial infarction ([place]) 09/07/12  . Peripheral vision loss    left  . Perirectal abscess    11/2018   . Pleural effusion, bilateral   . Pneumonia   . Pneumonia   . Renal artery stenosis ([facility])    a. 20% L RAS in 08/2012 by cath. b. Duplex 07/2012: >60% L RAS.  . Stroke, embolic ([place])    a. 11/2334 post cath. with residual reduced vision in left eye; noted right occiptal lobe/PCA infarct   . UTI (urinary tract  infection)   . UTI (urinary tract infection)   . Vasovagal syncope    a. 04/2013 - echo stable compared to prior EF 45-50%, negative carotid duplex.   Past Surgical History:  Procedure Laterality Date  . BACK SURGERY     x3, lower back  . BREAST BIOPSY Left 12/03/2012   U/S Core- Malignant  . BREAST LUMPECTOMY Left 01/08/2013  . BREAST LUMPECTOMY WITH NEEDLE LOCALIZATION AND AXILLARY SENTINEL LYMPH NODE BX Left 01/08/2013   Procedure: LEFT BREAST WIRE GUIDED  LUMPECTOMY AND LEFT AXILLARY SENTINEL  NODE BX;  Surgeon: Rolm Bookbinder, MD;  Location: [facility];  Service: General;  Laterality: Left;  . CATARACT EXTRACTION Bilateral   . CERVICAL BIOPSY  W/ LOOP ELECTRODE EXCISION     02/2001  . CORONARY ANGIOPLASTY  WITH STENT PLACEMENT    . EYE SURGERY     b/l cataract   . INCISION AND DRAINAGE PERIRECTAL ABSCESS N/A 12/11/2018   Procedure: IRRIGATION AND DEBRIDEMENT PERIRECTAL ABSCESS;  Surgeon: Johnathan Hausen, MD;  Location: [facility];  Service: General;  Laterality: N/A;  . LEFT HEART CATHETERIZATION WITH CORONARY ANGIOGRAM N/A 09/07/2012   Procedure: LEFT HEART CATHETERIZATION WITH CORONARY ANGIOGRAM;  Surgeon: Troy Sine, MD;  Location: [facility] CATH LAB;  Service: Cardiovascular;  Laterality: N/A;  . LEFT HEART CATHETERIZATION WITH CORONARY ANGIOGRAM  09/09/2012   Procedure: LEFT HEART CATHETERIZATION WITH CORONARY ANGIOGRAM;  Surgeon: Leonie Man, MD;  Location: [facility] CATH LAB;  Service: Cardiovascular;;  . PERCUTANEOUS CORONARY STENT INTERVENTION (PCI-S) N/A 09/07/2012   Procedure: PERCUTANEOUS CORONARY STENT INTERVENTION (PCI-S);  Surgeon: Troy Sine, MD;  Location: [facility] C[facility];  Service: Cardiovascular;  Laterality: N/A;  . PORT-A-CATH REMOVAL N/A 10/14/2013   Procedure: REMOVAL PORT-A-CATH;  Surgeon: Rolm Bookbinder, MD;  Location: [facility];  Service: General;  Laterality: N/A;  . PORTACATH PLACEMENT Right 02/17/2013   Procedure: INSERTION PORT-A-CATH;  Surgeon: Rolm Bookbinder, MD;   Location: [facility];  Service: General;  Laterality: Right;  . THORACENTESIS     12/13/2018 atypical cells not definitely diag. of malignancy    Social History   Socioeconomic History  . Marital status: Married    Spouse name: [name]  . Number of children: 2  . Years of education: college  . Highest education level: Not on file  Occupational History    Employer: [employer]  Tobacco Use  . Smoking status: Never Smoker  . Smokeless tobacco: Never Used  Substance and Sexual Activity  . Alcohol use: Not Currently    Comment: socially  . Drug use: No  . Sexual activity: Yes  Other Topics Concern  . Not on file  Social History Narrative   Married   Retired    Former Optometrist    Patient has 2 children and some college education.    Owns guns, wears seat belt, safe in relationship    1 cat   Social Determinants of Health   Financial Resource Strain:   . Difficulty of Paying Living Expenses:   Food Insecurity:   . Worried About Charity fundraiser in the Last Year:   . Arboriculturist in the Last Year:   Transportation Needs:   . Film/video editor (Medical):   Marland Kitchen Lack of Transportation (Non-Medical):   Physical Activity:   . Days of Exercise per Week:   . Minutes of Exercise per Session:   Stress:   . Feeling of Stress :   Social Connections:   . Frequency of Communication with Friends and Family:   . Frequency of Social Gatherings with Friends and Family:   . Attends Religious Services:   . Active Member of Clubs or Organizations:   . Attends Archivist Meetings:   Marland Kitchen Marital Status:   Intimate Partner Violence:   . Fear of Current or Ex-Partner:   . Emotionally Abused:   Marland Kitchen Physically Abused:   . Sexually Abused:    Current Outpatient Medications on File Prior to Visit  Medication Sig Dispense Refill  . aspirin 81 MG EC tablet Take 1 tablet (81 mg total) by mouth daily. 30 tablet 12  . atorvastatin (LIPITOR) 80 MG tablet TAKE 1 TABLET BY  MOUTH DAILY. 90 tablet 3  . carvedilol (COREG) 25 MG tablet TAKE 1 TABLET  BY MOUTH TWICE A DAY WITH MEALS 180 tablet 3  . ENTRESTO 49-51 MG TAKE 1 TABLET BY MOUTH 2 (TWO) TIMES DAILY. PLEASE KEEP UPCOMING APPT IN AUGUST WITH DR. ROSS FOR FUTURE REFILLS. THANK YOU 180 tablet 3  . ezetimibe (ZETIA) 10 MG tablet TAKE 1 TABLET BY MOUTH EVERY DAY 90 tablet 3  . feeding supplement, ENSURE ENLIVE, (ENSURE ENLIVE) LIQD Take 237 mLs by mouth 2 (two) times daily as needed (If PO intakes of meals are poor). 7 Bottle 12  . furosemide (LASIX) 80 MG tablet Take 1 tablet (80 mg total) by mouth daily. 90 tablet 3  . glucose blood (ONETOUCH VERIO) test strip Use TID 100 each 12  . hydrALAZINE (APRESOLINE) 10 MG tablet Take 1 tablet (10 mg total) by mouth 2 (two) times daily as needed. For BP >130/>80 90 tablet 2  . insulin NPH Human (NOVOLIN N RELION) 100 UNIT/ML injection Inject 0.15 mLs (15 Units total) into the skin 2 (two) times daily before a meal. 10 mL 11  . insulin regular (NOVOLIN R RELION) 100 units/mL injection Inject 0.1-0.15 mLs (10-15 Units total) into the skin 3 (three) times daily before meals. 10 mL 11  . isosorbide mononitrate (IMDUR) 60 MG 24 hr tablet TAKE 1 TABLET BY MOUTH DAILY. 90 tablet 3  . nitroGLYCERIN (NITROSTAT) 0.4 MG SL tablet Place 1 tablet (0.4 mg total) under the tongue every 5 (five) minutes as needed for chest pain. 25 tablet 11  . polyethylene glycol (MIRALAX / GLYCOLAX) packet Take 17 g by mouth daily. 14 each 0  . spironolactone (ALDACTONE) 25 MG tablet TAKE 1 TABLET BY MOUTH EVERY DAY 90 tablet 3   No current facility-administered medications on file prior to visit.   No Known Allergies Family History  Problem Relation Age of Onset  . COPD Mother   . Heart disease Father   . Hypertension Father   . Diabetes Brother   . Hypertension Brother   . Learning disabilities Brother   . Hypertension Brother   . Asthma Daughter   . Cancer Neg Hx   . Hyperlipidemia Neg Hx   .  Kidney disease Neg Hx   . Stroke Neg Hx     PE: BP (!) 160/98   Pulse 77   Ht _0  (1.651 m)   Wt 143 lb (64.9 kg)   SpO2 98%   BMI 23.80 kg/m  Wt Readings from Last 3 Encounters:  01/20/20 143 lb (64.9 kg)  12/29/19 132 lb 12.8 oz (60.2 kg)  11/26/19 110 lb (49.9 kg)   Constitutional: normal weight, in NAD, walks with a walker Eyes: PERRLA, EOMI, no exophthalmos ENT: moist mucous membranes, no thyromegaly, no cervical lymphadenopathy Cardiovascular: RRR, No MRG, + significant pitting edema up to knees bilaterally Respiratory: CTA B Gastrointestinal: abdomen soft, NT, ND, BS+ Musculoskeletal: no deformities, strength intact in all 4 Skin: moist, warm, no rashes Neurological: no tremor with outstretched hands, + hear tremor, DTR normal in all 4  ASSESSMENT: 1. DM2, insulin-dependent, uncontrolled, with long-term complications - CAD - CHF - Cerebrovascular disease-history of stroke - PN  2. HL  PLAN:  1. Patient with longstanding, very uncontrolled diabetes, on only sliding scale regular insulin at last visit, which was insufficient.  HbA1c was 14.1% at that time.  She could not afford analog insulins in the past so we switched to NPH and regular insulin in the basal-bolus regimen.  I also instructed her how to inject the insulins, 30 minutes before  meals, as she was injecting regular insulin before without relationship to her meals.  I also gave her 6 doses of regular insulin, states in the past she was using between 1 and 20 units.  I also gave her written instructions about how to mix the 2 insulins.  I advised her to check with her pharmacy to see if analog insulins become affordable again. -Since last visit, she had very high blood sugars and an HbA1c that was undetectably high earlier this month.. -At this visit, patient mentions that she is not missing her insulin injections regularly, but, the husband mentions that she does not see her taking the injections and usually  he has to help her through the house, including going to the restroom and coming back.  She denies this, however, her HbA1c very high blood sugars confirm her husband's statements.  Her husband became very emotional and started crying at today's visit, as she is not letting him -At this visit, we discussed about the importance of letting people help her.  We decided to have her take the injections before meals while her husband is present, so he can see her taking the injections.  Since she is most likely noncompliant with the insulins, I would not make large changes in her regimen.  For now, she is taking 10 to 15 units of NPH 2-3 times a day and I increase this to 15 units 3 times a day.  Therefore, she needs to be exactly the same amount of insulin before breakfast lunch and dinner to simplify the regimen. -I advised her to: Patient Instructions   Please change: Insulin Before breakfast Before lunch Before dinner  Regular (R) - clear 10-15 10-15 10-15  NPH (N) - cloudy _0 Please inject the insulin 30 min before meals.  Please return in 1.5 months with your sugar log.   - advised to check sugars at different times of the day - 3x a day, rotating check times - advised for yearly eye exams >> she is UTD - return to clinic in 1.5 months  2. Hl - Reviewed latest lipid panel from earlier this month - All fractions at goal: Lab Results  Component Value Date   CHOL 118 12/29/2019   HDL 55 12/29/2019   LDLCALC 45 12/29/2019   TRIG 95 12/29/2019   CHOLHDL 2.1 12/29/2019  - Continues Lipitor and Zetia without side effects.   Philemon Kingdom, MD PhD [facility] Endocrinology

## 2020-01-21 ENCOUNTER — Telehealth: Payer: Self-pay | Admitting: Internal Medicine

## 2020-01-21 DIAGNOSIS — I5042 Chronic combined systolic (congestive) and diastolic (congestive) heart failure: Secondary | ICD-10-CM | POA: Diagnosis not present

## 2020-01-21 DIAGNOSIS — E1159 Type 2 diabetes mellitus with other circulatory complications: Secondary | ICD-10-CM | POA: Diagnosis not present

## 2020-01-21 DIAGNOSIS — I420 Dilated cardiomyopathy: Secondary | ICD-10-CM | POA: Diagnosis not present

## 2020-01-21 DIAGNOSIS — I2510 Atherosclerotic heart disease of native coronary artery without angina pectoris: Secondary | ICD-10-CM | POA: Diagnosis not present

## 2020-01-21 DIAGNOSIS — I110 Hypertensive heart disease with heart failure: Secondary | ICD-10-CM | POA: Diagnosis not present

## 2020-01-21 DIAGNOSIS — I255 Ischemic cardiomyopathy: Secondary | ICD-10-CM | POA: Diagnosis not present

## 2020-01-21 LAB — CK: Total CK: 112 U/L (ref 32–182)

## 2020-01-21 NOTE — Telephone Encounter (Signed)
She has appt with me on 4/5   I will talk with her about meds

## 2020-01-21 NOTE — Telephone Encounter (Signed)
Marisa Forbes with [name] called to report pt had a fall yesterday at one of her appt and no injury  Virtual are normal this morning

## 2020-01-21 NOTE — Telephone Encounter (Signed)
For your information  

## 2020-01-21 NOTE — Telephone Encounter (Signed)
Noted  TMS 

## 2020-01-27 ENCOUNTER — Other Ambulatory Visit: Payer: Medicare Other

## 2020-01-27 DIAGNOSIS — I5042 Chronic combined systolic (congestive) and diastolic (congestive) heart failure: Secondary | ICD-10-CM | POA: Diagnosis not present

## 2020-01-27 DIAGNOSIS — I420 Dilated cardiomyopathy: Secondary | ICD-10-CM | POA: Diagnosis not present

## 2020-01-27 DIAGNOSIS — E1159 Type 2 diabetes mellitus with other circulatory complications: Secondary | ICD-10-CM | POA: Diagnosis not present

## 2020-01-27 DIAGNOSIS — I2510 Atherosclerotic heart disease of native coronary artery without angina pectoris: Secondary | ICD-10-CM | POA: Diagnosis not present

## 2020-01-27 DIAGNOSIS — I110 Hypertensive heart disease with heart failure: Secondary | ICD-10-CM | POA: Diagnosis not present

## 2020-01-27 DIAGNOSIS — I255 Ischemic cardiomyopathy: Secondary | ICD-10-CM | POA: Diagnosis not present

## 2020-01-28 ENCOUNTER — Telehealth: Payer: Self-pay | Admitting: Internal Medicine

## 2020-01-28 NOTE — Telephone Encounter (Signed)
Darlene from [facility] is requesting a Education officer, museum for pt. Pt's husband expressed the level of care that he can give @ home is more than he can provide and want to know of some community services to help them.   They would like to know if they can add nursing.   Physical therapist said she is gaining fluid and they think she is cutting back on lasiks b/c she has a difficult time getting out of chair and going to bathroom.

## 2020-01-28 NOTE — Telephone Encounter (Signed)
Please advise 

## 2020-02-01 NOTE — Progress Notes (Deleted)
Cardiology Office Note   Date:  02/01/2020   ID:  Marisa Forbes, DOB 06/14/1951, MRN [id number]  PCP:  McLean-Scocuzza, Nino Glow, MD  Cardiologist:   Marisa Carnes, MD   No chief complaint on file.  F/U of CAD      History of Present Illness: Marisa Forbes is a 69 y.o. female with a history of HTN, breast CA, DM, embolic CVA, RAS and CAD (s/p STEMI 2013 with DES to LAD and PTCA to distal LAD) Pt also had hx of systolic CHF  At one point  LVEF 20 to 25%  Admited Dec 2017 to [facility] with decompensated CHF   Echo LVEF 30 to 35%   Cardiac MR in Jan 2018:   Scar in the anteior, anteroseptal, apical walls     Admitted in Jan 2019 to [facility] with CHF, hypertensive urgency   BP 220 on arriva   Echo: LVEF 45 to 50%  Akinesis of apex, anteroseptal wall   Myovue  Scan in Feb 2019 showed scar but no ischemia   Plan for medical Rx   I saw the pt in clinic back in [place]  Since seen patient says starting around Thanksgiving she developed lower extremity edema.  Her son is here with her and says she had been cooking a lot at that time and eating different types of food.  Over this time since November  she has continued to have lower extremity edema.  Edema has limited her activity.  She also notes shortness of breath with activity.  Her son reports she is having difficulty getting out of chairs and getting up to the bathroom at night.  They have physical and occupational therapy coming to their home now.  I swa the pt on march 2021   Outpatient Medications Prior to Visit  Medication Sig Dispense Refill  . aspirin 81 MG EC tablet Take 1 tablet (81 mg total) by mouth daily. 30 tablet 12  . atorvastatin (LIPITOR) 80 MG tablet TAKE 1 TABLET BY MOUTH DAILY. 90 tablet 3  . carvedilol (COREG) 25 MG tablet TAKE 1 TABLET BY MOUTH TWICE A DAY WITH MEALS 180 tablet 3  . ENTRESTO 49-51 MG TAKE 1 TABLET BY MOUTH 2 (TWO) TIMES DAILY. PLEASE KEEP UPCOMING APPT IN AUGUST WITH DR. Anastacio Bua FOR FUTURE REFILLS. THANK  YOU 180 tablet 3  . ezetimibe (ZETIA) 10 MG tablet TAKE 1 TABLET BY MOUTH EVERY DAY 90 tablet 3  . feeding supplement, ENSURE ENLIVE, (ENSURE ENLIVE) LIQD Take 237 mLs by mouth 2 (two) times daily as needed (If PO intakes of meals are poor). 7 Bottle 12  . furosemide (LASIX) 80 MG tablet Take 1 tablet (80 mg total) by mouth daily. 90 tablet 3  . glucose blood (ONETOUCH VERIO) test strip Use TID 100 each 12  . hydrALAZINE (APRESOLINE) 10 MG tablet Take 1 tablet (10 mg total) by mouth 2 (two) times daily as needed. For BP >130/>80 90 tablet 2  . insulin NPH Human (NOVOLIN N RELION) 100 UNIT/ML injection Inject 0.15 mLs (15 Units total) into the skin 2 (two) times daily before a meal. 10 mL 11  . insulin regular (NOVOLIN R RELION) 100 units/mL injection Inject 0.1-0.15 mLs (10-15 Units total) into the skin 3 (three) times daily before meals. 10 mL 11  . isosorbide mononitrate (IMDUR) 60 MG 24 hr tablet TAKE 1 TABLET BY MOUTH DAILY. 90 tablet 3  . nitroGLYCERIN (NITROSTAT) 0.4 MG SL tablet Place 1 tablet (0.4  mg total) under the tongue every 5 (five) minutes as needed for chest pain. 25 tablet 11  . polyethylene glycol (MIRALAX / GLYCOLAX) packet Take 17 g by mouth daily. 14 each 0  . spironolactone (ALDACTONE) 25 MG tablet TAKE 1 TABLET BY MOUTH EVERY DAY 90 tablet 3   No facility-administered medications prior to visit.     Allergies:   Patient has no known allergies.   Past Medical History:  Diagnosis Date  . Arthritis    back  . Balance problems    due to stroke  . Benign head tremor    per pt due to muscle spasm in neck   . Breast cancer ([place]) 12/03/12   a. 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 S/p chemo with CMF and radiation.  . Cataracts, bilateral   . CHF (congestive heart failure) ([place])    On Monday she said her cardiologist was concerned for possible CHF, but is now improving-per pt.(07/26/15)  . CHF (congestive heart failure) ([place])   . Coronary  artery disease    a. STEMI 08/2012: s/p DES to LAD, PTCA to downstream LAD. b. Relook cath same hospitalization: stable post-PCI anatomy with Stable Diag lesions (unlikely cause of resting angina, not good PCI targets), stable RCA lesion (non flow limiting).  . Diabetes mellitus without complication ([place])    Takes insulin and metformin  . Dysplasia of cervix, low grade (CIN 1)    in 02 s/p LEEP   . GERD (gastroesophageal reflux disease)    food related  . History of cancer chemotherapy    last in August 2014  . 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 History of radiation therapy 06/23/2013-08/08/2013   62.4 gray to left breast  . HTN (hypertension)   . Hypercholesteremia   . Insomnia   . Left kidney mass   . LV dysfunction    a. EF 45-50% at time of STEMI 08/2012. b. Echo 04/2013: EF 45-50%, mild focal basal hypertrophy of septum, grade 1 d/d.  Marland Kitchen Myocardial infarction ([place]) 09/07/12  . Peripheral vision loss    left  . Perirectal abscess    11/2018   . Pleural effusion, bilateral   . Pneumonia   . Pneumonia   . Renal artery stenosis ([facility])    a. 20% L RAS in 08/2012 by cath. b. Duplex 07/2012: >60% L RAS.  . Stroke, embolic ([place])    a. 85/8850 post cath. with residual reduced vision in left eye; noted right occiptal lobe/PCA infarct   . UTI (urinary tract infection)   . UTI (urinary tract infection)   . Vasovagal syncope    a. 04/2013 - echo stable compared to prior EF 45-50%, negative carotid duplex.    Past Surgical History:  Procedure Laterality Date  . BACK SURGERY     x3, lower back  . BREAST BIOPSY Left 12/03/2012   U/S Core- Malignant  . BREAST LUMPECTOMY Left 01/08/2013  . BREAST LUMPECTOMY WITH NEEDLE LOCALIZATION AND AXILLARY SENTINEL LYMPH NODE BX Left 01/08/2013   Procedure: LEFT BREAST WIRE  GUIDED  LUMPECTOMY AND LEFT AXILLARY SENTINEL  NODE BX;  Surgeon: Rolm Bookbinder, MD;  Location: [facility];  Service: General;  Laterality: Left;  . CATARACT EXTRACTION Bilateral   . CERVICAL BIOPSY  W/ LOOP ELECTRODE EXCISION     02/2001  .  CORONARY ANGIOPLASTY WITH STENT PLACEMENT    . EYE SURGERY     b/l cataract   . INCISION AND DRAINAGE PERIRECTAL ABSCESS N/A 12/11/2018   Procedure: IRRIGATION AND DEBRIDEMENT PERIRECTAL ABSCESS;  Surgeon: Johnathan Hausen, MD;  Location: [facility];  Service: General;  Laterality: N/A;  . LEFT HEART CATHETERIZATION WITH CORONARY ANGIOGRAM N/A 09/07/2012   Procedure: LEFT HEART CATHETERIZATION WITH CORONARY ANGIOGRAM;  Surgeon: Troy Sine, MD;  Location: [facility] CATH LAB;  Service: Cardiovascular;  Laterality: N/A;  . LEFT HEART CATHETERIZATION WITH CORONARY ANGIOGRAM  09/09/2012   Procedure: LEFT HEART CATHETERIZATION WITH CORONARY ANGIOGRAM;  Surgeon: Leonie Man, MD;  Location: [facility] CATH LAB;  Service: Cardiovascular;;  . PERCUTANEOUS CORONARY STENT INTERVENTION (PCI-S) N/A 09/07/2012   Procedure: PERCUTANEOUS CORONARY STENT INTERVENTION (PCI-S);  Surgeon: Troy Sine, MD;  Location: [facility] CATH LAB;  Service: Cardiovascular;  Laterality: N/A;  . PORT-A-CATH REMOVAL N/A 10/14/2013   Procedure: REMOVAL PORT-A-CATH;  Surgeon: Rolm Bookbinder, MD;  Location: [facility];  Service: General;  Laterality: N/A;  . PORTACATH PLACEMENT Right 02/17/2013   Procedure: INSERTION PORT-A-CATH;  Surgeon: Rolm Bookbinder, MD;  Location: No Name;  Service: General;  Laterality: Right;  . THORACENTESIS     12/13/2018 atypical cells not definitely diag. of malignancy      Social History:  The patient  reports that she has never smoked. She has never used smokeless tobacco. She reports previous alcohol use. She reports that she does not use drugs.   Family History:  The patient's family history includes Asthma in her daughter; COPD in her mother; Diabetes in her brother; Heart disease  in her father; Hypertension in her brother, brother, and father; Learning disabilities in her brother.    ROS:  Please see the history of present illness. All other systems are reviewed and  Negative to the above problem except as noted.    PHYSICAL EXAM: VS:  There were no vitals taken for this visit.  GEN: Thin 69 yo  in no acute distress Examined in wheel chair  HEENT: normal  Neck: JVP is increase   Cardiac: RRR; no murmurs, rubs, or gallops,2+ edema  Respiratory:  clear to auscultation bilaterally, normal work of breathing GI: soft, nontender, nondistended, + BS  No hepatomegaly  MS: no deformity Moving all extremities   Skin: warm and dry, no rash Neuro:  Strength and sensation are intact Psych: euthymic mood, full affect   EKG:  EKG is  ordered today.  SR 74 bpm  First degree AV block  PR 212 msec POssible IMI   Anteroseptal MI  Possible anterior MI    No change from July 2020     Echo  01/02/19 IMPRESSIONS    1. The left ventricle has severely reduced systolic function, with an ejection fraction of 25-30%. The cavity size was moderately dilated. Left ventricular diastolic Doppler parameters are consistent with restrictive filling.  2. Inferior septal and apical hypokinesis.  3. The right ventricle has normal systolic function. The cavity was normal. There is no increase in right ventricular wall thickness.  4. Left atrial size was mildly dilated.  5. The mitral valve is degenerative. Moderate thickening of the mitral valve leaflet. Mild calcification of the mitral valve leaflet. There is mild mitral annular calcification present.  6. The tricuspid valve is normal in structure.  7. The aortic valve is tricuspid Moderate thickening of the aortic valve Moderate calcification of the aortic valve.  8. The pulmonic valve was grossly normal.  Pulmonic valve regurgitation is mild by color flow Doppler.  Lipid Panel    Component Value Date/Time   CHOL 118 12/29/2019 1110   TRIG  95 12/29/2019 1110   HDL 55 12/29/2019 1110   CHOLHDL 2.1 12/29/2019 1110   CHOLHDL 3.1 06/09/2016 1420   VLDL 23 06/09/2016 1420   LDLCALC 45 12/29/2019 1110      Wt Readings from Last 3 Encounters:  01/20/20 138 lb (62.6 kg)  01/20/20 143 lb (64.9 kg)  12/29/19 132 lb 12.8 oz (60.2 kg)      ASSESSMENT AND PLAN:  1  CAD patient denies angina.  EKG changes are old I am not convinced of active ischemia      2 Systolic CHF volume on exam is elevated.  Will check labs today and decide what to do with her medicines note she could not afford Entresto and did not fill it.  Will need to base further treatment based on her lab results today.  We will set tentative follow-up for 4 weeks time.    3  HTN BP is controlled  Continue current meds   4  HL On statin and zetia   WIll check lipids     5  DM   Last A1C over 13  Check again today   WIll check CBC, BMET, TSH  A1C, BNP, lipid,  Signed, Marisa Carnes, MD  02/01/2020 9:33 PM    [facility] [address], [place], [place]  [address] Phone: [phone]; Fax: [phone]

## 2020-02-02 ENCOUNTER — Other Ambulatory Visit: Payer: Medicare Other | Admitting: *Deleted

## 2020-02-02 ENCOUNTER — Ambulatory Visit: Payer: Medicare Other | Admitting: Internal Medicine

## 2020-02-02 ENCOUNTER — Other Ambulatory Visit: Payer: Self-pay

## 2020-02-02 DIAGNOSIS — E1142 Type 2 diabetes mellitus with diabetic polyneuropathy: Secondary | ICD-10-CM | POA: Diagnosis not present

## 2020-02-02 DIAGNOSIS — Z79899 Other long term (current) drug therapy: Secondary | ICD-10-CM | POA: Diagnosis not present

## 2020-02-02 DIAGNOSIS — I10 Essential (primary) hypertension: Secondary | ICD-10-CM

## 2020-02-02 DIAGNOSIS — I5042 Chronic combined systolic (congestive) and diastolic (congestive) heart failure: Secondary | ICD-10-CM

## 2020-02-02 DIAGNOSIS — I5022 Chronic systolic (congestive) heart failure: Secondary | ICD-10-CM | POA: Diagnosis not present

## 2020-02-02 DIAGNOSIS — Z794 Long term (current) use of insulin: Secondary | ICD-10-CM | POA: Diagnosis not present

## 2020-02-02 NOTE — Telephone Encounter (Signed)
Pt should be on Entresto and spironolactone  Please clarify why not taking

## 2020-02-02 NOTE — Telephone Encounter (Signed)
[place] another order so pt can get home social work and nursing in addition to already [place] call [place] add on social work and nursing   Advise pt should be on meds per cardiology lasix the dose in chart, entresto and spironolactone Will advise cardiology she is not taking them    [place]

## 2020-02-02 NOTE — Addendum Note (Signed)
Addended by: Orland Mustard on: 02/02/2020 01:58 PM   Modules accepted: Orders

## 2020-02-03 LAB — BASIC METABOLIC PANEL
BUN/Creatinine Ratio: 29 — ABNORMAL HIGH (ref 12–28)
BUN: 23 mg/dL (ref 8–27)
CO2: 27 mmol/L (ref 20–29)
Calcium: 9.1 mg/dL (ref 8.7–10.3)
Chloride: 93 mmol/L — ABNORMAL LOW (ref 96–106)
Creatinine, Ser: 0.78 mg/dL (ref 0.57–1.00)
GFR calc Af Amer: 90 mL/min/{1.73_m2} (ref >59)
GFR calc non Af Amer: 78 mL/min/{1.73_m2} (ref >59)
Glucose: 367 mg/dL — ABNORMAL HIGH (ref 65–99)
Potassium: 4.1 mmol/L (ref 3.5–5.2)
Sodium: 133 mmol/L — ABNORMAL LOW (ref 134–144)

## 2020-02-03 LAB — PRO B NATRIURETIC PEPTIDE: NT-Pro BNP: 5273 pg/mL — ABNORMAL HIGH (ref 0–301)

## 2020-02-03 NOTE — Telephone Encounter (Signed)
I think she should just go back on losartan 100   Too difficult to get Entresto  Do not need another hurdle

## 2020-02-03 NOTE — Telephone Encounter (Signed)
Called and clarified patients meds. She said she if not taking Entresto because she cannot afford the several hundred dollar cost.  Has not applied for patient assistance.  I explained how that works, printed her the application from [employer] and will mail it to her.  She will fill in and return it to our office.  Reviewed all other cardiac medications.  They are correct in her chart.  Lasix was increased from 40 mg to 80 mg daily after last labs on 01/14/20.  Large amount of urine out each day.  Takes consistently.  Pt aware that she will be contacted with recommendations/plan for follow up after Dr. Harrington Challenger reviews yesterday's lab results.

## 2020-02-04 ENCOUNTER — Telehealth: Payer: Self-pay

## 2020-02-04 DIAGNOSIS — I5042 Chronic combined systolic (congestive) and diastolic (congestive) heart failure: Secondary | ICD-10-CM

## 2020-02-04 DIAGNOSIS — E8770 Fluid overload, unspecified: Secondary | ICD-10-CM

## 2020-02-04 MED ORDER — POTASSIUM CHLORIDE ER 10 MEQ PO TBCR: 10.0000 meq | EXTENDED_RELEASE_TABLET | Freq: Every day | ORAL | 3 refills | Status: AC

## 2020-02-04 MED ORDER — METOLAZONE 2.5 MG PO TABS: 2.5000 mg | ORAL_TABLET | Freq: Every day | ORAL | 3 refills | Status: DC

## 2020-02-04 NOTE — Telephone Encounter (Signed)
The patient has been notified of the result and verbalized understanding.  All questions (if any) were answered. Frederik Schmidt, RN 02/04/2020 8:59 AM

## 2020-02-04 NOTE — Telephone Encounter (Signed)
-----   Message from [place], MD sent at 02/03/2020 10:33 PM EDT ----- FLuid is still up Keep on 80 lasix daily   Add 2.5 Zaroxylyn to this (take 30 min prior to lasix ) and  10 K[place] Check BMET and BNP in 7 days

## 2020-02-05 NOTE — Telephone Encounter (Signed)
Social work and skilled nursing called in with [employer]

## 2020-02-06 ENCOUNTER — Telehealth: Payer: Self-pay | Admitting: Internal Medicine

## 2020-02-06 NOTE — Telephone Encounter (Signed)
Marisa Forbes [name] called and states that pt declined nursing evaluation today and asked her to come back on Monday 4/12.

## 2020-02-06 NOTE — Telephone Encounter (Signed)
For your information  

## 2020-02-07 DIAGNOSIS — E1159 Type 2 diabetes mellitus with other circulatory complications: Secondary | ICD-10-CM | POA: Diagnosis not present

## 2020-02-07 DIAGNOSIS — I110 Hypertensive heart disease with heart failure: Secondary | ICD-10-CM | POA: Diagnosis not present

## 2020-02-07 DIAGNOSIS — I5042 Chronic combined systolic (congestive) and diastolic (congestive) heart failure: Secondary | ICD-10-CM | POA: Diagnosis not present

## 2020-02-07 DIAGNOSIS — I420 Dilated cardiomyopathy: Secondary | ICD-10-CM | POA: Diagnosis not present

## 2020-02-07 DIAGNOSIS — I2510 Atherosclerotic heart disease of native coronary artery without angina pectoris: Secondary | ICD-10-CM | POA: Diagnosis not present

## 2020-02-07 DIAGNOSIS — I255 Ischemic cardiomyopathy: Secondary | ICD-10-CM | POA: Diagnosis not present

## 2020-02-09 ENCOUNTER — Telehealth: Payer: Self-pay | Admitting: Internal Medicine

## 2020-02-09 DIAGNOSIS — I420 Dilated cardiomyopathy: Secondary | ICD-10-CM | POA: Diagnosis not present

## 2020-02-09 DIAGNOSIS — I5042 Chronic combined systolic (congestive) and diastolic (congestive) heart failure: Secondary | ICD-10-CM | POA: Diagnosis not present

## 2020-02-09 DIAGNOSIS — I2510 Atherosclerotic heart disease of native coronary artery without angina pectoris: Secondary | ICD-10-CM | POA: Diagnosis not present

## 2020-02-09 DIAGNOSIS — I110 Hypertensive heart disease with heart failure: Secondary | ICD-10-CM | POA: Diagnosis not present

## 2020-02-09 DIAGNOSIS — E1159 Type 2 diabetes mellitus with other circulatory complications: Secondary | ICD-10-CM | POA: Diagnosis not present

## 2020-02-09 DIAGNOSIS — I255 Ischemic cardiomyopathy: Secondary | ICD-10-CM | POA: Diagnosis not present

## 2020-02-09 NOTE — Telephone Encounter (Signed)
Cara with [name] called and wants verbal order on nursing for once a week for five weeks. [phone]

## 2020-02-09 NOTE — Telephone Encounter (Signed)
Called and spoke with Marisa Forbes at [employer]. Informed them that we never received the e-mail or fax. They tried to transfer me to the extension below but the call did not go through and was disconnected. Marisa Forbes states she will reach out to the person whose name is attached to this extension and have them call me back at [phone]

## 2020-02-09 NOTE — Telephone Encounter (Signed)
Ok verbal ° °

## 2020-02-09 NOTE — Telephone Encounter (Signed)
Mark with [name] starting 02/08/20 2x week for 6 weeks 1x week for 2 weeks  He said if he doesn't answer his line is secure to leave a message.

## 2020-02-09 NOTE — Telephone Encounter (Signed)
Okay for verbal 

## 2020-02-09 NOTE — Telephone Encounter (Signed)
Okay given  °

## 2020-02-10 ENCOUNTER — Telehealth: Payer: Self-pay | Admitting: Internal Medicine

## 2020-02-10 DIAGNOSIS — I110 Hypertensive heart disease with heart failure: Secondary | ICD-10-CM | POA: Diagnosis not present

## 2020-02-10 NOTE — Telephone Encounter (Signed)
[name] orders for MSW for community resources faxed on 02/10/2020. Fax # [phone].

## 2020-02-10 NOTE — Telephone Encounter (Signed)
Called and left detailed voicemail. The medications are under the Care of the patient's cardiologist Dr Harrington Challenger. Informed Cara to call Cardiology to see what they would like to do about the possible interaction. Dr Olivia Mackie agrees with this.

## 2020-02-10 NOTE — Telephone Encounter (Signed)
Marisa Forbes called to check on the verbal orders She also called to report that there maybe a potential major interaction with the potassium and Spironolactone Please contact Marisa Forbes @ [phone] can leave a detailed message

## 2020-02-11 ENCOUNTER — Telehealth: Payer: Self-pay | Admitting: Internal Medicine

## 2020-02-11 ENCOUNTER — Other Ambulatory Visit: Payer: Medicare Other | Admitting: *Deleted

## 2020-02-11 ENCOUNTER — Other Ambulatory Visit: Payer: Self-pay

## 2020-02-11 DIAGNOSIS — I5042 Chronic combined systolic (congestive) and diastolic (congestive) heart failure: Secondary | ICD-10-CM

## 2020-02-11 DIAGNOSIS — E8770 Fluid overload, unspecified: Secondary | ICD-10-CM | POA: Diagnosis not present

## 2020-02-11 NOTE — Telephone Encounter (Signed)
Patient is have blood work today (BMET, BNP)

## 2020-02-11 NOTE — Telephone Encounter (Signed)
Faxed order to [name] Reschedule and eval per patient request . On 4.14.2021

## 2020-02-11 NOTE — Telephone Encounter (Signed)
Pt c/o medication issue:  1. Name of Medication: potassium chloride (KLOR-CON) 10 MEQ tablet  2. How are you currently taking this medication (dosage and times per day)? As directed  3. Are you having a reaction (difficulty breathing--STAT)? No  4. What is your medication issue? [place] with [name] states she is concerned that if the patient is taking potassium chloride (KLOR-CON) 10 MEQ tablet medication while also taking spironolactone (ALDACTONE) 25 MG tablet it may cause interaction. Please advise.

## 2020-02-12 ENCOUNTER — Telehealth: Payer: Self-pay | Admitting: Internal Medicine

## 2020-02-12 LAB — BASIC METABOLIC PANEL
BUN/Creatinine Ratio: 27 (ref 12–28)
BUN: 26 mg/dL (ref 8–27)
CO2: 29 mmol/L (ref 20–29)
Calcium: 9.4 mg/dL (ref 8.7–10.3)
Chloride: 87 mmol/L — ABNORMAL LOW (ref 96–106)
Creatinine, Ser: 0.97 mg/dL (ref 0.57–1.00)
GFR calc Af Amer: 69 mL/min/{1.73_m2} (ref >59)
GFR calc non Af Amer: 60 mL/min/{1.73_m2} (ref >59)
Glucose: 566 mg/dL (ref 65–99)
Potassium: 5 mmol/L (ref 3.5–5.2)
Sodium: 129 mmol/L — ABNORMAL LOW (ref 134–144)

## 2020-02-12 LAB — PRO B NATRIURETIC PEPTIDE: NT-Pro BNP: 1733 pg/mL — ABNORMAL HIGH (ref 0–301)

## 2020-02-12 NOTE — Telephone Encounter (Signed)
[place] Certification Certification to [name] for certification period 02/10/2020 -04/09/2020 order date 02/07/2020. Faxed # [phone]

## 2020-02-12 NOTE — Telephone Encounter (Signed)
Signed and Faxed Medication profile update to [name] on 4.15.2021. # [phone].

## 2020-02-13 ENCOUNTER — Telehealth: Payer: Self-pay | Admitting: Internal Medicine

## 2020-02-13 NOTE — Telephone Encounter (Signed)
Talked with patient re: most recent lab results and recommendations. She will come for labs on 03/02/20. Labs forwarded to PCP. Message to Dr. Harrington Challenger to see my note re: losartan.

## 2020-02-13 NOTE — Telephone Encounter (Signed)
Faxed MSW evaluation to [name] on [id number] Cert date [id number] Order date 02/12/20 Faxed to [phone]

## 2020-02-14 ENCOUNTER — Emergency Department: Payer: Medicare Other

## 2020-02-14 ENCOUNTER — Inpatient Hospital Stay: Payer: Medicare Other

## 2020-02-14 ENCOUNTER — Inpatient Hospital Stay
Admission: EM | Admit: 2020-02-14 | Discharge: 2020-02-16 | DRG: 078 | Disposition: A | Discharge status: Home Health | Payer: Medicare Other | Attending: Internal Medicine | Admitting: Internal Medicine

## 2020-02-14 ENCOUNTER — Other Ambulatory Visit: Payer: Self-pay

## 2020-02-14 DIAGNOSIS — Z7982 Long term (current) use of aspirin: Secondary | ICD-10-CM

## 2020-02-14 DIAGNOSIS — I161 Hypertensive emergency: Secondary | ICD-10-CM | POA: Diagnosis present

## 2020-02-14 DIAGNOSIS — E1165 Type 2 diabetes mellitus with hyperglycemia: Secondary | ICD-10-CM | POA: Diagnosis present

## 2020-02-14 DIAGNOSIS — Z8673 Personal history of transient ischemic attack (TIA), and cerebral infarction without residual deficits: Secondary | ICD-10-CM | POA: Diagnosis not present

## 2020-02-14 DIAGNOSIS — M479 Spondylosis, unspecified: Secondary | ICD-10-CM | POA: Diagnosis present

## 2020-02-14 DIAGNOSIS — Z79899 Other long term (current) drug therapy: Secondary | ICD-10-CM

## 2020-02-14 DIAGNOSIS — Z833 Family history of diabetes mellitus: Secondary | ICD-10-CM

## 2020-02-14 DIAGNOSIS — K529 Noninfective gastroenteritis and colitis, unspecified: Secondary | ICD-10-CM | POA: Diagnosis present

## 2020-02-14 DIAGNOSIS — G9341 Metabolic encephalopathy: Secondary | ICD-10-CM

## 2020-02-14 DIAGNOSIS — I2510 Atherosclerotic heart disease of native coronary artery without angina pectoris: Secondary | ICD-10-CM | POA: Diagnosis present

## 2020-02-14 DIAGNOSIS — G9340 Encephalopathy, unspecified: Secondary | ICD-10-CM | POA: Diagnosis not present

## 2020-02-14 DIAGNOSIS — I674 Hypertensive encephalopathy: Secondary | ICD-10-CM | POA: Diagnosis present

## 2020-02-14 DIAGNOSIS — Z8249 Family history of ischemic heart disease and other diseases of the circulatory system: Secondary | ICD-10-CM | POA: Diagnosis not present

## 2020-02-14 DIAGNOSIS — I110 Hypertensive heart disease with heart failure: Secondary | ICD-10-CM | POA: Diagnosis present

## 2020-02-14 DIAGNOSIS — Z923 Personal history of irradiation: Secondary | ICD-10-CM | POA: Diagnosis not present

## 2020-02-14 DIAGNOSIS — Z87410 Personal history of cervical dysplasia: Secondary | ICD-10-CM | POA: Diagnosis not present

## 2020-02-14 DIAGNOSIS — R4182 Altered mental status, unspecified: Secondary | ICD-10-CM | POA: Diagnosis not present

## 2020-02-14 DIAGNOSIS — K2900 Acute gastritis without bleeding: Secondary | ICD-10-CM | POA: Diagnosis present

## 2020-02-14 DIAGNOSIS — R636 Underweight: Secondary | ICD-10-CM | POA: Diagnosis present

## 2020-02-14 DIAGNOSIS — I5042 Chronic combined systolic (congestive) and diastolic (congestive) heart failure: Secondary | ICD-10-CM | POA: Diagnosis present

## 2020-02-14 DIAGNOSIS — R5381 Other malaise: Secondary | ICD-10-CM

## 2020-02-14 DIAGNOSIS — Z681 Body mass index (BMI) 19 or less, adult: Secondary | ICD-10-CM

## 2020-02-14 DIAGNOSIS — I252 Old myocardial infarction: Secondary | ICD-10-CM | POA: Diagnosis not present

## 2020-02-14 DIAGNOSIS — R410 Disorientation, unspecified: Secondary | ICD-10-CM

## 2020-02-14 DIAGNOSIS — Z794 Long term (current) use of insulin: Secondary | ICD-10-CM

## 2020-02-14 DIAGNOSIS — R112 Nausea with vomiting, unspecified: Secondary | ICD-10-CM

## 2020-02-14 DIAGNOSIS — E871 Hypo-osmolality and hyponatremia: Secondary | ICD-10-CM | POA: Diagnosis present

## 2020-02-14 DIAGNOSIS — Z20822 Contact with and (suspected) exposure to covid-19: Secondary | ICD-10-CM | POA: Diagnosis present

## 2020-02-14 DIAGNOSIS — E7800 Pure hypercholesterolemia, unspecified: Secondary | ICD-10-CM | POA: Diagnosis present

## 2020-02-14 DIAGNOSIS — I160 Hypertensive urgency: Secondary | ICD-10-CM | POA: Diagnosis not present

## 2020-02-14 DIAGNOSIS — H5462 Unqualified visual loss, left eye, normal vision right eye: Secondary | ICD-10-CM | POA: Diagnosis present

## 2020-02-14 DIAGNOSIS — I10 Essential (primary) hypertension: Secondary | ICD-10-CM | POA: Diagnosis not present

## 2020-02-14 DIAGNOSIS — R52 Pain, unspecified: Secondary | ICD-10-CM | POA: Diagnosis not present

## 2020-02-14 DIAGNOSIS — R1084 Generalized abdominal pain: Secondary | ICD-10-CM | POA: Diagnosis not present

## 2020-02-14 DIAGNOSIS — R519 Headache, unspecified: Secondary | ICD-10-CM | POA: Diagnosis not present

## 2020-02-14 DIAGNOSIS — Z9221 Personal history of antineoplastic chemotherapy: Secondary | ICD-10-CM | POA: Diagnosis not present

## 2020-02-14 DIAGNOSIS — Z853 Personal history of malignant neoplasm of breast: Secondary | ICD-10-CM | POA: Diagnosis not present

## 2020-02-14 DIAGNOSIS — I639 Cerebral infarction, unspecified: Secondary | ICD-10-CM | POA: Diagnosis not present

## 2020-02-14 DIAGNOSIS — R1110 Vomiting, unspecified: Secondary | ICD-10-CM | POA: Diagnosis not present

## 2020-02-14 DIAGNOSIS — K219 Gastro-esophageal reflux disease without esophagitis: Secondary | ICD-10-CM | POA: Diagnosis present

## 2020-02-14 DIAGNOSIS — N2889 Other specified disorders of kidney and ureter: Secondary | ICD-10-CM | POA: Diagnosis not present

## 2020-02-14 LAB — CBC
HCT: 47.7 % — ABNORMAL HIGH (ref 36.0–46.0)
Hemoglobin: 16.4 g/dL — ABNORMAL HIGH (ref 12.0–15.0)
MCH: 29 pg (ref 26.0–34.0)
MCHC: 34.4 g/dL (ref 30.0–36.0)
MCV: 84.4 fL (ref 80.0–100.0)
Platelets: 245 10*3/uL (ref 150–400)
RBC: 5.65 MIL/uL — ABNORMAL HIGH (ref 3.87–5.11)
RDW: 13.3 % (ref 11.5–15.5)
WBC: 10.2 10*3/uL (ref 4.0–10.5)
nRBC: 0 % (ref 0.0–0.2)

## 2020-02-14 LAB — LIPASE, BLOOD: Lipase: 40 U/L (ref 11–51)

## 2020-02-14 LAB — COMPREHENSIVE METABOLIC PANEL
ALT: 30 U/L (ref 0–44)
AST: 28 U/L (ref 15–41)
Albumin: 3.5 g/dL (ref 3.5–5.0)
Alkaline Phosphatase: 101 U/L (ref 38–126)
Anion gap: 13 (ref 5–15)
BUN: 30 mg/dL — ABNORMAL HIGH (ref 8–23)
CO2: 30 mmol/L (ref 22–32)
Calcium: 10.5 mg/dL — ABNORMAL HIGH (ref 8.9–10.3)
Chloride: 89 mmol/L — ABNORMAL LOW (ref 98–111)
Creatinine, Ser: 0.76 mg/dL (ref 0.44–1.00)
GFR calc Af Amer: >60 mL/min (ref >60)
GFR calc non Af Amer: >60 mL/min (ref >60)
Glucose, Bld: 354 mg/dL — ABNORMAL HIGH (ref 70–99)
Potassium: 3.8 mmol/L (ref 3.5–5.1)
Sodium: 132 mmol/L — ABNORMAL LOW (ref 135–145)
Total Bilirubin: 1.3 mg/dL — ABNORMAL HIGH (ref 0.3–1.2)
Total Protein: 7 g/dL (ref 6.5–8.1)

## 2020-02-14 MED ORDER — INSULIN ASPART 100 UNIT/ML ~~LOC~~ SOLN
0.0000 [IU] | SUBCUTANEOUS | Status: DC
  Administered 2020-02-15 (×2): 5 [IU] via SUBCUTANEOUS
  Administered 2020-02-15: 8 [IU] via SUBCUTANEOUS
  Administered 2020-02-15: 5 [IU] via SUBCUTANEOUS
  Administered 2020-02-15: 8 [IU] via SUBCUTANEOUS
  Administered 2020-02-15: 5 [IU] via SUBCUTANEOUS
  Administered 2020-02-16 (×2): 2 [IU] via SUBCUTANEOUS
  Filled 2020-02-14 (×8): qty 1

## 2020-02-14 MED ORDER — LABETALOL HCL 5 MG/ML IV SOLN
20.0000 mg | Freq: Once | INTRAVENOUS | Status: AC
  Administered 2020-02-14: 20 mg via INTRAVENOUS
  Filled 2020-02-14: qty 4

## 2020-02-14 MED ORDER — HALOPERIDOL LACTATE 5 MG/ML IJ SOLN
2.5000 mg | Freq: Once | INTRAMUSCULAR | Status: AC
  Administered 2020-02-14: 2.5 mg via INTRAVENOUS
  Filled 2020-02-14: qty 1

## 2020-02-14 MED ORDER — MORPHINE SULFATE (PF) 4 MG/ML IV SOLN
4.0000 mg | Freq: Once | INTRAVENOUS | Status: AC
  Administered 2020-02-14: 4 mg via INTRAVENOUS
  Filled 2020-02-14: qty 1

## 2020-02-14 MED ORDER — SODIUM CHLORIDE 0.9 % IV SOLN: INTRAVENOUS | Status: DC

## 2020-02-14 MED ORDER — HYDRALAZINE HCL 25 MG PO TABS
25.0000 mg | ORAL_TABLET | ORAL | Status: DC
  Filled 2020-02-14: qty 1

## 2020-02-14 MED ORDER — LABETALOL HCL 5 MG/ML IV SOLN: 10.0000 mg | INTRAVENOUS | Status: DC | PRN

## 2020-02-14 MED ORDER — ONDANSETRON HCL 4 MG PO TABS: 4.0000 mg | ORAL_TABLET | Freq: Four times a day (QID) | ORAL | Status: DC | PRN

## 2020-02-14 MED ORDER — ONDANSETRON HCL 4 MG/2ML IJ SOLN
4.0000 mg | Freq: Four times a day (QID) | INTRAMUSCULAR | Status: DC | PRN
  Administered 2020-02-15: 4 mg via INTRAVENOUS
  Filled 2020-02-14: qty 2

## 2020-02-14 MED ORDER — ENOXAPARIN SODIUM 40 MG/0.4ML ~~LOC~~ SOLN
40.0000 mg | SUBCUTANEOUS | Status: DC
  Administered 2020-02-15 – 2020-02-16 (×2): 40 mg via SUBCUTANEOUS
  Filled 2020-02-14 (×2): qty 0.4

## 2020-02-14 MED ORDER — PANTOPRAZOLE SODIUM 40 MG IV SOLR
40.0000 mg | Freq: Every evening | INTRAVENOUS | Status: DC
  Administered 2020-02-15 (×2): 40 mg via INTRAVENOUS
  Filled 2020-02-14 (×2): qty 40

## 2020-02-14 MED ORDER — SODIUM CHLORIDE 0.9 % IV BOLUS
500.0000 mL | Freq: Once | INTRAVENOUS | Status: AC
  Administered 2020-02-14: 500 mL via INTRAVENOUS

## 2020-02-14 MED ORDER — METOCLOPRAMIDE HCL 5 MG/ML IJ SOLN
10.0000 mg | Freq: Four times a day (QID) | INTRAMUSCULAR | Status: AC
  Administered 2020-02-15 (×4): 10 mg via INTRAVENOUS
  Filled 2020-02-14 (×4): qty 2

## 2020-02-14 MED ORDER — ONDANSETRON HCL 4 MG/2ML IJ SOLN
4.0000 mg | Freq: Once | INTRAMUSCULAR | Status: AC
  Administered 2020-02-14: 4 mg via INTRAVENOUS
  Filled 2020-02-14: qty 2

## 2020-02-14 MED ORDER — ACETAMINOPHEN 325 MG PO TABS: 650.0000 mg | ORAL_TABLET | Freq: Four times a day (QID) | ORAL | Status: DC | PRN

## 2020-02-14 MED ORDER — PROMETHAZINE HCL 25 MG/ML IJ SOLN
12.5000 mg | Freq: Once | INTRAMUSCULAR | Status: AC
  Administered 2020-02-14: 12.5 mg via INTRAVENOUS
  Filled 2020-02-14: qty 1

## 2020-02-14 MED ORDER — ACETAMINOPHEN 650 MG RE SUPP: 650.0000 mg | Freq: Four times a day (QID) | RECTAL | Status: DC | PRN

## 2020-02-14 NOTE — ED Triage Notes (Signed)
Patient arrived via [facility] from home, AOx4 and ambulatory. Patient called 911 due to feeling sick which began suddenly this morning with N/V, CBG with EMS was 400. Patient has had bowel movement right before EMS arrived. Patient does have Hx of MI with stent placement. Patient was given 4mg  IV Zofran and 241mL of normal saline.   Patient Emesis is tenacious and green, bile looking color

## 2020-02-14 NOTE — ED Provider Notes (Signed)
Procedures     ----------------------------------------- 10:31 PM on 02/14/2020 -----------------------------------------  Assumed care from Dr. Corky Downs at 3:00 PM.  Plan was to reassess after she woke up from sleep induced by morphine and Phenergan that were given for her vomiting.  However when she woke up later in the afternoon, she was still vomiting and appeared confused.  I ordered Haldol around 7 PM for the vomiting, obtain CT scan of the head which is negative, give IV labetalol for blood pressure control.  With blood pressure somewhat improved to 176/105, other vital signs unremarkable, patient still remains confused, unable to take anything by mouth including pills.  Will need to hospitalize for uncontrolled hypertension, confusion.     Carrie Mew, MD 02/14/20 2233

## 2020-02-14 NOTE — ED Provider Notes (Signed)
[facility] Emergency Department Provider Note   ____________________________________________    I have reviewed the triage vital signs and the nursing notes.   HISTORY  Chief Complaint Nausea, Hyperglycemia, and Emesis     HPI Marisa Forbes is a 69 y.o. female with significant past medical history detailed below who presents with complaints of nausea and vomiting.  Patient reports this started this morning, yesterday she was feeling well.  She complains of diffuse abdominal cramping with nausea and vomiting multiple times this morning.  Also reports hyperglycemia.  Reports compliance with her diabetes medications.  Denies chest pain.  No shortness of breath.  Greenish vomitus.  Normal stools thus far.  No fevers or chills.  No sick contacts reported.  Is not take anything for this.  EMS did give Zofran  Past Medical History:  Diagnosis Date  . Arthritis    back  . Balance problems    due to stroke  . Benign head tremor    per pt due to muscle spasm in neck   . Breast cancer ([place]) 12/03/12   a. 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 S/p chemo with CMF and radiation.  . Cataracts, bilateral   . CHF (congestive heart failure) ([place])    On Monday she said her cardiologist was concerned for possible CHF, but is now improving-per pt.(07/26/15)  . CHF (congestive heart failure) ([place])   . Coronary artery disease    a. STEMI 08/2012: s/p DES to LAD, PTCA to downstream LAD. b. Relook cath same hospitalization: stable post-PCI anatomy with Stable Diag lesions (unlikely cause of resting angina, not good PCI targets), stable RCA lesion (non flow limiting).  . Diabetes mellitus without complication ([place])    Takes insulin and metformin  . Dysplasia of cervix, low grade (CIN 1)    in 02 s/p LEEP   . GERD (gastroesophageal reflux disease)    food related  . History of cancer chemotherapy    last in August 2014  . History of  echocardiogram    Echo 2/19: Mild concentric LVH, moderate focal basal septal hypertrophy, EF 45-50, apical akinesis (?apical pseudoaneurysm), anteroseptal akinesis, grade 1 diastolic dysfunction, MAC  . History of radiation therapy 06/23/2013-08/08/2013   62.4 gray to left breast  . HTN (hypertension)   . Hypercholesteremia   . Insomnia   . Left kidney mass   . LV dysfunction    a. EF 45-50% at time of STEMI 08/2012. b. Echo 04/2013: EF 45-50%, mild focal basal hypertrophy of septum, grade 1 d/d.  Marland Kitchen Myocardial infarction ([place]) 09/07/12  . Peripheral vision loss    left  . Perirectal abscess    11/2018   . Pleural effusion, bilateral   . Pneumonia   . Pneumonia   . Renal artery stenosis ([facility])    a. 20% L RAS in 08/2012 by cath. b. Duplex 07/2012: >60% L RAS.  . Stroke, embolic ([place])    a. 96/2229 post cath. with residual reduced vision in left eye; noted right occiptal lobe/PCA infarct   . UTI (urinary tract infection)   . UTI (urinary tract infection)   . Vasovagal syncope    a. 04/2013 - echo stable compared to prior EF 45-50%, negative carotid duplex.    Patient Active Problem List   Diagnosis Date Noted  . Weakness 01/20/2020  . Cerebrovascular accident (CVA) ([place]) 01/20/2020  . Gait abnormality 01/20/2020  . Mobility impaired 01/07/2020  . Sinusitis 12/28/2019  .  Decreased activities of daily living (ADL) 12/05/2019  . Physical deconditioning 12/05/2019  . Impaired instrumental activities of daily living (IADL) 12/05/2019  . Abnormal gait 12/05/2019  . Insomnia 12/25/2018  . Peri-rectal abscess 12/12/2018  . Perirectal abscess 12/11/2018  . Left renal mass 12/11/2018  . Pleural effusion 12/11/2018  . Lower urinary tract infectious disease   . Proctitis   . Unintentional weight loss   . Chronic combined systolic (congestive) and diastolic (congestive) heart failure ([place]) 01/22/2018  . Elevated troponin 12/22/2016  . Chest pain, rule out acute myocardial infarction  07/29/2015  . 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 Poorly controlled type 2 diabetes mellitus with circulatory disorder ([place])   . Ischemic cardiomyopathy   . Congestive dilated cardiomyopathy ([place]) 04/13/2015  . 3rd cranial nerve palsy   . Diplopia   . CVA (cerebral infarction) 04/11/2015  . Renal artery stenosis ([place])   . LV dysfunction   . Benign paroxysmal positional vertigo 05/19/2013  . Cancer of upper-inner quadrant of female breast ([place]) 12/06/2012  . Cervical dystonia 10/18/2012  . Peripheral neuropathy 10/18/2012  . Other screening mammogram 10/03/2012  . History of completed stroke 09/12/2012    Class: Acute  . Hyperlipidemia with target LDL less than 70 09/09/2012  . CAD (coronary artery disease) 09/09/2012  . NSVT, 5 beats 11/10 09/09/2012  . Hypertensive heart disease with CHF (congestive heart failure) ([place]) 09/07/2012    Past Surgical History:  Procedure Laterality Date  . BACK SURGERY     x3, lower back  . BREAST BIOPSY Left 12/03/2012   U/S Core- Malignant  . BREAST LUMPECTOMY Left 01/08/2013  . BREAST LUMPECTOMY WITH NEEDLE LOCALIZATION AND AXILLARY SENTINEL LYMPH NODE BX Left 01/08/2013   Procedure: LEFT BREAST WIRE GUIDED  LUMPECTOMY AND LEFT AXILLARY SENTINEL  NODE BX;  Surgeon: Rolm Bookbinder, MD;  Location: [place];  Service: General;  Laterality: Left;  . CATARACT EXTRACTION Bilateral   . CERVICAL BIOPSY  W/ LOOP ELECTRODE EXCISION     02/2001  . CORONARY ANGIOPLASTY WITH STENT PLACEMENT    . EYE SURGERY     b/l cataract   . INCISION AND DRAINAGE PERIRECTAL ABSCESS N/A 12/11/2018   Procedure: IRRIGATION AND DEBRIDEMENT PERIRECTAL ABSCESS;  Surgeon: Johnathan Hausen, MD;  Location: [facility];  Service: General;  Laterality: N/A;  . LEFT HEART CATHETERIZATION WITH CORONARY ANGIOGRAM N/A 09/07/2012   Procedure: LEFT HEART CATHETERIZATION WITH CORONARY ANGIOGRAM;  Surgeon: Troy Sine, MD;  Location: [facility] CATH LAB;  Service: Cardiovascular;  Laterality: N/A;  .  LEFT HEART CATHETERIZATION WITH CORONARY ANGIOGRAM  09/09/2012   Procedure: LEFT HEART CATHETERIZATION WITH CORONARY ANGIOGRAM;  Surgeon: Leonie Man, MD;  Location: [facility] CATH LAB;  Service: Cardiovascular;;  . PERCUTANEOUS CORONARY STENT INTERVENTION (PCI-S) N/A 09/07/2012   Procedure: PERCUTANEOUS CORONARY STENT INTERVENTION (PCI-S);  Surgeon: Troy Sine, MD;  Location: [facility] CATH LAB;  Service: Cardiovascular;  Laterality: N/A;  . PORT-A-CATH REMOVAL N/A 10/14/2013   Procedure: REMOVAL PORT-A-CATH;  Surgeon: Rolm Bookbinder, MD;  Location: [facility];  Service: General;  Laterality: N/A;  . PORTACATH PLACEMENT Right 02/17/2013   Procedure: INSERTION PORT-A-CATH;  Surgeon: Rolm Bookbinder, MD;  Location: [place];  Service: General;  Laterality: Right;  . THORACENTESIS     12/13/2018 atypical cells not definitely diag. of malignancy     Prior to Admission medications   Medication Sig Start Date End Date Taking? Authorizing Provider  aspirin 81 MG EC tablet Take 1 tablet (81 mg total)  by mouth daily. 04/16/15   Barton Dubois, MD  atorvastatin (LIPITOR) 80 MG tablet TAKE 1 TABLET BY MOUTH DAILY. 07/15/19   Fay Records, MD  carvedilol (COREG) 25 MG tablet TAKE 1 TABLET BY MOUTH TWICE A DAY WITH MEALS 07/14/19   Fay Records, MD  ENTRESTO 49-51 MG TAKE 1 TABLET BY MOUTH 2 (TWO) TIMES DAILY. PLEASE KEEP UPCOMING APPT IN AUGUST WITH DR. ROSS FOR FUTURE REFILLS. [place] YOU Patient not taking: Reported on 02/03/2020 07/14/19   Fay Records, MD  ezetimibe (ZETIA) 10 MG tablet TAKE 1 TABLET BY MOUTH EVERY DAY 07/15/19   Fay Records, MD  feeding supplement, ENSURE ENLIVE, (ENSURE ENLIVE) LIQD Take 237 mLs by mouth 2 (two) times daily as needed (If PO intakes of meals are poor). 12/14/18   Kayleen Memos, DO  furosemide (LASIX) 80 MG tablet Take 1 tablet (80 mg total) by mouth daily. 01/19/20   Fay Records, MD  glucose blood [facility] VERIO) test strip Use TID 10/03/12   Janith Lima, MD  hydrALAZINE  (APRESOLINE) 10 MG tablet Take 1 tablet (10 mg total) by mouth 2 (two) times daily as needed. For BP >130/>80 12/25/18   McLean-Scocuzza, Nino Glow, MD  insulin NPH Human (NOVOLIN N RELION) 100 UNIT/ML injection Inject 0.15 mLs (15 Units total) into the skin 2 (two) times daily before a meal. 11/04/19   Philemon Kingdom, MD  insulin regular (NOVOLIN R RELION) 100 units/mL injection Inject 0.1-0.15 mLs (10-15 Units total) into the skin 3 (three) times daily before meals. 11/04/19   Philemon Kingdom, MD  isosorbide mononitrate (IMDUR) 60 MG 24 hr tablet TAKE 1 TABLET BY MOUTH DAILY. 07/14/19   Fay Records, MD  metolazone (ZAROXOLYN) 2.5 MG tablet Take 1 tablet (2.5 mg total) by mouth daily. 02/04/20 05/04/20  Fay Records, MD  nitroGLYCERIN (NITROSTAT) 0.4 MG SL tablet Place 1 tablet (0.4 mg total) under the tongue every 5 (five) minutes as needed for chest pain. 04/26/18   Fay Records, MD  polyethylene glycol [facility] / Floria Raveling) packet Take 17 g by mouth daily. 12/15/18   Kayleen Memos, DO  potassium chloride (KLOR-CON) 10 MEQ tablet Take 1 tablet (10 mEq total) by mouth daily. 02/04/20   Fay Records, MD  spironolactone (ALDACTONE) 25 MG tablet TAKE 1 TABLET BY MOUTH EVERY DAY 01/19/20   Fay Records, MD     Allergies Patient has no known allergies.  Family History  Problem Relation Age of Onset  . COPD Mother   . Heart disease Father   . Hypertension Father   . Diabetes Brother   . Hypertension Brother   . Learning disabilities Brother   . Hypertension Brother   . Asthma Daughter   . Cancer Neg Hx   . Hyperlipidemia Neg Hx   . Kidney disease Neg Hx   . Stroke Neg Hx     Social History Social History   Tobacco Use  . Smoking status: Never Smoker  . Smokeless tobacco: Never Used  Substance Use Topics  . Alcohol use: Not Currently  . Drug use: No    Review of Systems  Constitutional: No fever/chills Eyes: No visual changes.  ENT: No sore throat. Cardiovascular: Denies chest  pain. Respiratory: Denies shortness of breath. Gastrointestinal: As above Genitourinary: Negative for dysuria. Musculoskeletal: Negative for back pain. Skin: Negative for rash. Neurological: Negative for headaches   ____________________________________________   PHYSICAL EXAM:  VITAL SIGNS: ED Triage Vitals  Enc Vitals Group     BP 02/14/20 1101 (!) 207/129     Pulse Rate 02/14/20 1101 78     Resp 02/14/20 1101 (!) 21     Temp 02/14/20 1101 98.5 F (36.9 C)     Temp Source 02/14/20 1101 Oral     SpO2 02/14/20 1101 100 %     Weight 02/14/20 1059 62 kg (136 lb 11 oz)     Height 02/14/20 1059 1.651 m (_0 )     Head Circumference --      Peak Flow --      Pain Score 02/14/20 1059 8     Pain Loc --      Pain Edu? --      Excl. in [place]? --     Constitutional: Alert and oriented.   Nose: No congestion/rhinnorhea. Mouth/Throat: Mucous membranes are moist.    Cardiovascular: Normal rate, regular rhythm. Grossly normal heart sounds.  Good peripheral circulation. Respiratory: Normal respiratory effort.  No retractions. Lungs CTAB. Gastrointestinal: Soft, mild tenderness diffusely. No distention.  No CVA tenderness.  Musculoskeletal:  Warm and well perfused Neurologic:  Normal speech and language. No gross focal neurologic deficits are appreciated.  Skin:  Skin is warm, dry and intact. No rash noted. Psychiatric: Mood and affect are normal. Speech and behavior are normal.  ____________________________________________   LABS (all labs ordered are listed, but only abnormal results are displayed)  Labs Reviewed  CBC - Abnormal; Notable for the following components:      Result Value   RBC 5.65 (*)    Hemoglobin 16.4 (*)    HCT 47.7 (*)    All other components within normal limits  COMPREHENSIVE METABOLIC PANEL - Abnormal; Notable for the following components:   Sodium 132 (*)    Chloride 89 (*)    Glucose, Bld 354 (*)    BUN 30 (*)    Calcium 10.5 (*)    Total  Bilirubin 1.3 (*)    All other components within normal limits  LIPASE, BLOOD  URINALYSIS, COMPLETE (UACMP) WITH MICROSCOPIC   ____________________________________________  EKG  ED ECG REPORT I, Lavonia Drafts, the attending physician, personally viewed and interpreted this ECG.  Date: 02/14/2020  Rhythm: normal sinus rhythm QRS Axis: normal Intervals: normal ST/T Wave abnormalities: Nonspecific changes Narrative Interpretation: no evidence of acute ischemia  ____________________________________________  RADIOLOGY   ____________________________________________   PROCEDURES  Procedure(s) performed: No  Procedures   Critical Care performed: No ____________________________________________   INITIAL IMPRESSION / ASSESSMENT AND PLAN / ED COURSE  Pertinent labs & imaging results that were available during my care of the patient were reviewed by me and considered in my medical decision making (see chart for details).  Patient presents with nausea vomiting diffuse abdominal discomfort.  Reports of hyperglycemia.  Differential includes gastritis, colitis, gastroenteritis, DKA.  Pending labs, will give IV fluids, additional dose of IV Zofran.  EKG does not demonstrate prolonged QTC.  Patient given morphine for abdominal cramping pain.  Also received Phenergan as no significant improvement with Zofran.  Lab work is quite reassuring.  ----------------------------------------- 2:53 PM on 02/14/2020 -----------------------------------------  Patient is resting quietly at this time, will need reassessment but I anticipate likely discharge    ____________________________________________   FINAL CLINICAL IMPRESSION(S) / ED DIAGNOSES  Final diagnoses:  Acute gastritis without hemorrhage, unspecified gastritis type        Note:  This document was prepared using Dragon voice recognition software and may include unintentional dictation errors.  Lavonia Drafts,  MD 02/14/20 1453

## 2020-02-14 NOTE — ED Notes (Signed)
Pt provided with graham crackers and saltine crackers for PO challenge per MD orders. Pt still very drowsy at this time. Pt family member at bedside to encourage pt to wake and do PO challenge. Will continue to monitor. MD made aware.

## 2020-02-14 NOTE — ED Notes (Signed)
This RN at bedside. Pt actively vomiting with green bile. This RN gave pt morphine first upon pt request due to phenergan not being verified by pharmacy yet.

## 2020-02-14 NOTE — H&P (Signed)
History and Physical    DESHUNDA THACKSTON VFI:[id number] DOB: Jun 28, 1951 DOA: 02/14/2020  PCP: McLean-Scocuzza, Nino Glow, MD   Patient coming from: home  I have personally briefly reviewed patient's old medical records in [place]  Chief Complaint: vomiting, confusion  HPI: Marisa Forbes is a 69 y.o. female with medical history significant for uncontrolled diabetes, last A1c >15, chronic combined CHF, CAD, history of CVA and breast cancer, who initially presented to the emergency room earlier in the day with intractable vomiting.  She was treated with IV antiemetics for suspected acute gastroenteritis following unremarkable blood work.  Her blood pressure was markedly elevated in the ER at 270/129.  She received multiple antiemetics and given IV labetalol for blood pressure and observed for several hours.  Her emesis resolved but she was noted to be confused.  CT head done showed no acute intracranial findings.  Did show old occipital stroke.  Hospitalist consulted for admission. ED Course: Blood pressure was 207/129 on arrival, improving to 176/105 with treatment.  Blood work mostly unremarkable.  Lipase slightly elevated at 40 with total bilirubin 1.3.  White cell count normal at 10.2.  Head CT showing no acute disease.  Chest x-ray and abdominal CT requested at time of admission.  Review of Systems: Unable to obtain as patient is disoriented  Past Medical History:  Diagnosis Date  . Arthritis    back  . Balance problems    due to stroke  . Benign head tremor    per pt due to muscle spasm in neck   . Breast cancer ([place]) 12/03/12   a. 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 S/p chemo with CMF and radiation.  . Cataracts, bilateral   . CHF (congestive heart failure) ([place])    On Monday she said her cardiologist was concerned for possible CHF, but is now improving-per pt.(07/26/15)  . CHF (congestive heart failure) ([place])   . Coronary artery disease    a. STEMI 08/2012: s/p DES to LAD, PTCA to downstream LAD. b. Relook cath same hospitalization: stable post-PCI anatomy with Stable Diag lesions (unlikely cause of resting angina, not good PCI targets), stable RCA lesion (non flow limiting).  . Diabetes mellitus without complication ([place])    Takes insulin and metformin  . Dysplasia of cervix, low grade (CIN 1)    in 02 s/p LEEP   . GERD (gastroesophageal reflux disease)    food related  . History of cancer chemotherapy    last in August 2014  . 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 History of radiation therapy 06/23/2013-08/08/2013   62.4 gray to left breast  . HTN (hypertension)   . Hypercholesteremia   . Insomnia   . Left kidney mass   . LV dysfunction    a. EF 45-50% at time of STEMI 08/2012. b. Echo 04/2013: EF 45-50%, mild focal basal hypertrophy of septum, grade 1 d/d.  Marland Kitchen Myocardial infarction ([place]) 09/07/12  . Peripheral vision loss    left  . Perirectal abscess    11/2018   . Pleural effusion, bilateral   . Pneumonia   . Pneumonia   . Renal artery stenosis ([facility])    a. 20% L RAS in 08/2012 by cath. b. Duplex 07/2012: >60% L RAS.  . Stroke, embolic ([place])    a. 41/6606 post  cath. with residual reduced vision in left eye; noted right occiptal lobe/PCA infarct   . UTI (urinary tract infection)   . UTI (urinary tract infection)   . Vasovagal syncope    a. 04/2013 - echo stable compared to prior EF 45-50%, negative carotid duplex.    Past Surgical History:  Procedure Laterality Date  . BACK SURGERY     x3, lower back  . BREAST BIOPSY Left 12/03/2012   U/S Core- Malignant  . BREAST LUMPECTOMY Left 01/08/2013  . BREAST LUMPECTOMY WITH NEEDLE LOCALIZATION AND AXILLARY SENTINEL LYMPH NODE BX Left 01/08/2013   Procedure: LEFT BREAST WIRE GUIDED  LUMPECTOMY  AND LEFT AXILLARY SENTINEL  NODE BX;  Surgeon: Rolm Bookbinder, MD;  Location: [facility];  Service: General;  Laterality: Left;  . CATARACT EXTRACTION Bilateral   . CERVICAL BIOPSY  W/ LOOP ELECTRODE EXCISION     02/2001  . CORONARY ANGIOPLASTY WITH STENT PLACEMENT    . EYE SURGERY     b/l cataract   . INCISION AND DRAINAGE PERIRECTAL ABSCESS N/A 12/11/2018   Procedure: IRRIGATION AND DEBRIDEMENT PERIRECTAL ABSCESS;  Surgeon: Johnathan Hausen, MD;  Location: [facility];  Service: General;  Laterality: N/A;  . LEFT HEART CATHETERIZATION WITH CORONARY ANGIOGRAM N/A 09/07/2012   Procedure: LEFT HEART CATHETERIZATION WITH CORONARY ANGIOGRAM;  Surgeon: Troy Sine, MD;  Location: [facility] CATH LAB;  Service: Cardiovascular;  Laterality: N/A;  . LEFT HEART CATHETERIZATION WITH CORONARY ANGIOGRAM  09/09/2012   Procedure: LEFT HEART CATHETERIZATION WITH CORONARY ANGIOGRAM;  Surgeon: Leonie Man, MD;  Location: [facility] CATH LAB;  Service: Cardiovascular;;  . PERCUTANEOUS CORONARY STENT INTERVENTION (PCI-S) N/A 09/07/2012   Procedure: PERCUTANEOUS CORONARY STENT INTERVENTION (PCI-S);  Surgeon: Troy Sine, MD;  Location: [facility] CATH LAB;  Service: Cardiovascular;  Laterality: N/A;  . PORT-A-CATH REMOVAL N/A 10/14/2013   Procedure: REMOVAL PORT-A-CATH;  Surgeon: Rolm Bookbinder, MD;  Location: [facility];  Service: General;  Laterality: N/A;  . PORTACATH PLACEMENT Right 02/17/2013   Procedure: INSERTION PORT-A-CATH;  Surgeon: Rolm Bookbinder, MD;  Location: [facility];  Service: General;  Laterality: Right;  . THORACENTESIS     12/13/2018 atypical cells not definitely diag. of malignancy      reports that she has never smoked. She has never used smokeless tobacco. She reports previous alcohol use. She reports that she does not use drugs.  No Known Allergies  Family History  Problem Relation Age of Onset  . COPD Mother   . Heart disease Father   . Hypertension Father   . Diabetes Brother   . Hypertension Brother   .  Learning disabilities Brother   . Hypertension Brother   . Asthma Daughter   . Cancer Neg Hx   . Hyperlipidemia Neg Hx   . Kidney disease Neg Hx   . Stroke Neg Hx      Prior to Admission medications   Medication Sig Start Date End Date Taking? Authorizing Provider  aspirin 81 MG EC tablet Take 1 tablet (81 mg total) by mouth daily. 04/16/15   Barton Dubois, MD  atorvastatin (LIPITOR) 80 MG tablet TAKE 1 TABLET BY MOUTH DAILY. 07/15/19   Fay Records, MD  carvedilol (COREG) 25 MG tablet TAKE 1 TABLET BY MOUTH TWICE A DAY WITH MEALS 07/14/19   Fay Records, MD  ENTRESTO 49-51 MG TAKE 1 TABLET BY MOUTH 2 (TWO) TIMES DAILY. PLEASE KEEP UPCOMING APPT IN AUGUST WITH DR. ROSS FOR FUTURE REFILLS. Powers YOU Patient not taking: Reported on  02/03/2020 07/14/19   Fay Records, MD  ezetimibe (ZETIA) 10 MG tablet TAKE 1 TABLET BY MOUTH EVERY DAY 07/15/19   Fay Records, MD  feeding supplement, ENSURE ENLIVE, (ENSURE ENLIVE) LIQD Take 237 mLs by mouth 2 (two) times daily as needed (If PO intakes of meals are poor). 12/14/18   Kayleen Memos, DO  furosemide (LASIX) 80 MG tablet Take 1 tablet (80 mg total) by mouth daily. 01/19/20   Fay Records, MD  glucose blood [name] VERIO) test strip Use TID 10/03/12   Janith Lima, MD  hydrALAZINE (APRESOLINE) 10 MG tablet Take 1 tablet (10 mg total) by mouth 2 (two) times daily as needed. For BP >130/>80 12/25/18   McLean-Scocuzza, Nino Glow, MD  insulin NPH Human (NOVOLIN N RELION) 100 UNIT/ML injection Inject 0.15 mLs (15 Units total) into the skin 2 (two) times daily before a meal. 11/04/19   Philemon Kingdom, MD  insulin regular (NOVOLIN R RELION) 100 units/mL injection Inject 0.1-0.15 mLs (10-15 Units total) into the skin 3 (three) times daily before meals. 11/04/19   Philemon Kingdom, MD  isosorbide mononitrate (IMDUR) 60 MG 24 hr tablet TAKE 1 TABLET BY MOUTH DAILY. 07/14/19   Fay Records, MD  metolazone (ZAROXOLYN) 2.5 MG tablet Take 1 tablet (2.5 mg total) by  mouth daily. 02/04/20 05/04/20  Fay Records, MD  nitroGLYCERIN (NITROSTAT) 0.4 MG SL tablet Place 1 tablet (0.4 mg total) under the tongue every 5 (five) minutes as needed for chest pain. 04/26/18   Fay Records, MD  polyethylene glycol [facility] Floria Raveling) packet Take 17 g by mouth daily. 12/15/18   Kayleen Memos, DO  potassium chloride (KLOR-CON) 10 MEQ tablet Take 1 tablet (10 mEq total) by mouth daily. 02/04/20   Fay Records, MD  spironolactone (ALDACTONE) 25 MG tablet TAKE 1 TABLET BY MOUTH EVERY DAY 01/19/20   Fay Records, MD    Physical Exam: Vitals:   02/14/20 2100 02/14/20 2115 02/14/20 2130 02/14/20 2200  BP: (!) 190/104  (!) 184/106 (!) 176/105  Pulse: 80 80 82 84  Resp: _0 Temp:      TempSrc:      SpO2: 100% 100% 100% 100%  Weight:      Height:         Vitals:   02/14/20 2100 02/14/20 2115 02/14/20 2130 02/14/20 2200  BP: (!) 190/104  (!) 184/106 (!) 176/105  Pulse: 80 80 82 84  Resp: _1 Temp:      TempSrc:      SpO2: 100% 100% 100% 100%  Weight:      Height:        Constitutional: Lethargic, oriented x2.  Appears undernourished Eyes: PERLA, EOMI, irises appear normal, anicteric sclera,  ENMT: external ears and nose appear normal, normal hearing             Lips appears normal, oropharynx mucosa, tongue, posterior pharynx appear normal  Neck: neck appears normal, no masses, normal ROM, no thyromegaly, no JVD  CVS: S1-S2 clear, no murmur rubs or gallops,  , no carotid bruits, pedal pulses palpable, No LE edema Respiratory:  clear to auscultation bilaterally, no wheezing, rales or rhonchi. Respiratory effort normal. No accessory muscle use.  Abdomen: soft nontender, nondistended, normal bowel sounds, no hepatosplenomegaly, no hernias Musculoskeletal: : no cyanosis, clubbing , no contractures or atrophy Neuro: Grossly intact no focal deficits skin: no rashes or lesions or ulcers,  no induration or nodules   Labs on Admission: I have personally  reviewed following labs and imaging studies  CBC: Recent Labs  Lab 02/14/20 1103  WBC 10.2  HGB 16.4*  HCT 47.7*  MCV 84.4  PLT 785   Basic Metabolic Panel: Recent Labs  Lab 02/11/20 1258 02/14/20 1103  NA 129* 132*  K 5.0 3.8  CL 87* 89*  CO2 29 30  GLUCOSE 566* 354*  BUN 26 30*  CREATININE 0.97 0.76  CALCIUM 9.4 10.5*   GFR: Estimated Creatinine Clearance: 59.7 mL/min (by C-G formula based on SCr of 0.76 mg/dL). Liver Function Tests: Recent Labs  Lab 02/14/20 1103  AST 28  ALT 30  ALKPHOS 101  BILITOT 1.3*  PROT 7.0  ALBUMIN 3.5   Recent Labs  Lab 02/14/20 1103  LIPASE 40   No results for input(s): AMMONIA in the last 168 hours. Coagulation Profile: No results for input(s): INR, PROTIME in the last 168 hours. Cardiac Enzymes: No results for input(s): CKTOTAL, CKMB, CKMBINDEX, TROPONINI in the last 168 hours. BNP (last 3 results) Recent Labs    01/14/20 1103 02/02/20 1356 02/11/20 1258  PROBNP 5,532* 5,273* 1,733*   HbA1C: No results for input(s): HGBA1C in the last 72 hours. CBG: No results for input(s): GLUCAP in the last 168 hours. Lipid Profile: No results for input(s): CHOL, HDL, LDLCALC, TRIG, CHOLHDL, LDLDIRECT in the last 72 hours. Thyroid Function Tests: No results for input(s): TSH, T4TOTAL, FREET4, T3FREE, THYROIDAB in the last 72 hours. Anemia Panel: No results for input(s): VITAMINB12, FOLATE, FERRITIN, TIBC, IRON, RETICCTPCT in the last 72 hours. Urine analysis:    Component Value Date/Time   COLORURINE YELLOW 09/30/2019 1453   APPEARANCEUR Cloudy (A) 09/30/2019 1453   LABSPEC 1.020 09/30/2019 1453   PHURINE 5.5 09/30/2019 1453   GLUCOSEU >=1000 (A) 09/30/2019 1453   HGBUR LARGE (A) 09/30/2019 1453   BILIRUBINUR NEGATIVE 09/30/2019 1453   KETONESUR NEGATIVE 09/30/2019 1453   PROTEINUR 100 (A) 12/10/2018 2238   UROBILINOGEN 0.2 09/30/2019 1453   NITRITE NEGATIVE 09/30/2019 1453   LEUKOCYTESUR NEGATIVE 09/30/2019 1453     Radiological Exams on Admission: CT HEAD WO CONTRAST  Result Date: 02/14/2020 CLINICAL DATA:  Headache, nausea vomiting, hypertension EXAM: CT HEAD WITHOUT CONTRAST TECHNIQUE: Contiguous axial images were obtained from the base of the skull through the vertex without intravenous contrast. COMPARISON:  12/11/2018 FINDINGS: Brain: Hypodensities throughout the periventricular and subcortical white matter are again noted, most prominent in the right occipital lobe, consistent with chronic ischemic change. No sign of acute infarct or hemorrhage. Lateral ventricles and midline structures are grossly unremarkable. Chronic right thalamic lacunar infarct again noted. No acute extra-axial fluid collections. No mass effect. Vascular: No hyperdense vessel or unexpected calcification. Skull: Normal. Negative for fracture or focal lesion. Sinuses/Orbits: There is opacification of the left sphenoid sinus. Remaining paranasal sinuses are clear. Other: None. IMPRESSION: 1. Chronic ischemic changes throughout the periventricular and subcortical white matter. 2. No acute intracranial process. 3. Left sphenoid sinus disease. Electronically Signed   By: Randa Ngo M.D.   On: 02/14/2020 18:56    EKG: Independently reviewed.   Assessment/Plan Principal Problem:   Acute metabolic encephalopathy -Etiology unclear at this time but may be related to hypertensive emergency -Patient did get a dose of Haldol to treat her emesis in the emergency room, unclear if this is contributing -CT head showing no acute disease and patient has no focal deficits -follow up MRI to evaluate for CVA and  PRES -Neurologic checks, aspiration precautions    Nausea and vomiting -Etiology uncertain, possible gastritis, possible diabetic gastroparesis -Lipase mildly elevated at 40.  Bilirubin 1.3. -Follow-up CT abdomen and pelvis ordered on admission -Appears resolved with Zofran, Phenergan and Haldol given in the emergency room -We will  keep n.p.o. except for ice chips -IV Protonix and IV antiemetics -IV hydration with caution given heart failure    Hypertensive emergency -Blood pressure 207/129 in the emergency room -IV as needed hypertensives, labetalol 10 mg IV every 2 as needed systolic blood pressure above 180 -To resume home antihypertensives once tolerating    Hyperglycemia due to type 2 diabetes mellitus (HCC) -Blood sugar 354.  Last A1c in March was over 15.  Blood sugar in the past several days were above 500 per chart review -Regular insulin sliding scale coverage    CAD (coronary artery disease) -Patient had no complaints of chest pain.  EKG was nonacute -Resume aspirin, atorvastatin and carvedilol and Nitrostat once tolerating    History of completed stroke -Continue aspirin and Zetia.  Not currently on a statin likely related to medication intolerance -CT head showed no acute disease -May consider MRI    Chronic combined systolic (congestive) and diastolic (congestive) heart failure ([place]) --Once tolerating, continue atorvastatin, carvedilol.  She is on Entresto pending medication reconciliation -Patient being gently hydrated.  Monitor for fluid overload     DVT prophylaxis: Lovenox  Code Status: full code  Family Communication:  none  Disposition Plan: Back to previous home environment Consults called: none  Status:inp    Athena Masse MD [facility]     02/14/2020, 10:49 PM

## 2020-02-14 NOTE — ED Notes (Signed)
Pt transported to CT ?

## 2020-02-14 NOTE — ED Notes (Signed)
Pt awake, actively vomiting. Pt unable to tolerate PO at this time. Pt BP still trending high. MD made aware

## 2020-02-14 NOTE — Telephone Encounter (Signed)
Pt was on lisinpril in past    Recomm:   Can pt come in on 4/29 for BP and exam ?  Noon?    Adjust meds at that time.

## 2020-02-14 NOTE — ED Notes (Addendum)
Pt oxygen saturation dropped to 85%. This RN at bedside. Pt was sleeping. Pt woken up and oxygen increased to 95%. Pt fell alseep again and oxygen saturation dropped again but increased once woken up. Pt placed on 2L [place] while sleeping. MD made aware.

## 2020-02-15 ENCOUNTER — Inpatient Hospital Stay: Payer: Medicare Other

## 2020-02-15 ENCOUNTER — Encounter: Payer: Self-pay | Admitting: Internal Medicine

## 2020-02-15 DIAGNOSIS — Z8673 Personal history of transient ischemic attack (TIA), and cerebral infarction without residual deficits: Secondary | ICD-10-CM

## 2020-02-15 DIAGNOSIS — R112 Nausea with vomiting, unspecified: Secondary | ICD-10-CM

## 2020-02-15 DIAGNOSIS — Z794 Long term (current) use of insulin: Secondary | ICD-10-CM

## 2020-02-15 DIAGNOSIS — I674 Hypertensive encephalopathy: Principal | ICD-10-CM

## 2020-02-15 DIAGNOSIS — I5042 Chronic combined systolic (congestive) and diastolic (congestive) heart failure: Secondary | ICD-10-CM

## 2020-02-15 DIAGNOSIS — E1165 Type 2 diabetes mellitus with hyperglycemia: Secondary | ICD-10-CM

## 2020-02-15 DIAGNOSIS — I161 Hypertensive emergency: Secondary | ICD-10-CM

## 2020-02-15 LAB — GLUCOSE, CAPILLARY
Glucose-Capillary: 133 mg/dL — ABNORMAL HIGH (ref 70–99)
Glucose-Capillary: 203 mg/dL — ABNORMAL HIGH (ref 70–99)
Glucose-Capillary: 209 mg/dL — ABNORMAL HIGH (ref 70–99)
Glucose-Capillary: 212 mg/dL — ABNORMAL HIGH (ref 70–99)
Glucose-Capillary: 215 mg/dL — ABNORMAL HIGH (ref 70–99)
Glucose-Capillary: 253 mg/dL — ABNORMAL HIGH (ref 70–99)
Glucose-Capillary: 264 mg/dL — ABNORMAL HIGH (ref 70–99)

## 2020-02-15 LAB — BASIC METABOLIC PANEL
Anion gap: 11 (ref 5–15)
BUN: 38 mg/dL — ABNORMAL HIGH (ref 8–23)
CO2: 29 mmol/L (ref 22–32)
Calcium: 9.6 mg/dL (ref 8.9–10.3)
Chloride: 92 mmol/L — ABNORMAL LOW (ref 98–111)
Creatinine, Ser: 0.68 mg/dL (ref 0.44–1.00)
GFR calc Af Amer: >60 mL/min (ref >60)
GFR calc non Af Amer: >60 mL/min (ref >60)
Glucose, Bld: 220 mg/dL — ABNORMAL HIGH (ref 70–99)
Potassium: 3.4 mmol/L — ABNORMAL LOW (ref 3.5–5.1)
Sodium: 132 mmol/L — ABNORMAL LOW (ref 135–145)

## 2020-02-15 LAB — PHOSPHORUS: Phosphorus: 3.1 mg/dL (ref 2.5–4.6)

## 2020-02-15 LAB — CBC
HCT: 48.2 % — ABNORMAL HIGH (ref 36.0–46.0)
Hemoglobin: 16.7 g/dL — ABNORMAL HIGH (ref 12.0–15.0)
MCH: 29.1 pg (ref 26.0–34.0)
MCHC: 34.6 g/dL (ref 30.0–36.0)
MCV: 84 fL (ref 80.0–100.0)
Platelets: 261 10*3/uL (ref 150–400)
RBC: 5.74 MIL/uL — ABNORMAL HIGH (ref 3.87–5.11)
RDW: 13.7 % (ref 11.5–15.5)
WBC: 9.2 10*3/uL (ref 4.0–10.5)
nRBC: 0 % (ref 0.0–0.2)

## 2020-02-15 LAB — SARS CORONAVIRUS 2 (TAT 6-24 HRS): SARS Coronavirus 2: NEGATIVE

## 2020-02-15 LAB — MAGNESIUM: Magnesium: 2.1 mg/dL (ref 1.7–2.4)

## 2020-02-15 MED ORDER — ATORVASTATIN CALCIUM 80 MG PO TABS
80.0000 mg | ORAL_TABLET | Freq: Every day | ORAL | Status: DC
  Administered 2020-02-15 – 2020-02-16 (×2): 80 mg via ORAL
  Filled 2020-02-15 (×2): qty 1

## 2020-02-15 MED ORDER — CARVEDILOL 25 MG PO TABS
25.0000 mg | ORAL_TABLET | Freq: Two times a day (BID) | ORAL | Status: DC
  Administered 2020-02-15 – 2020-02-16 (×2): 25 mg via ORAL
  Filled 2020-02-15 (×2): qty 1

## 2020-02-15 MED ORDER — FUROSEMIDE 40 MG PO TABS
80.0000 mg | ORAL_TABLET | Freq: Every day | ORAL | Status: DC
  Administered 2020-02-15 – 2020-02-16 (×2): 80 mg via ORAL
  Filled 2020-02-15 (×2): qty 2

## 2020-02-15 MED ORDER — ASPIRIN EC 81 MG PO TBEC
81.0000 mg | DELAYED_RELEASE_TABLET | Freq: Every day | ORAL | Status: DC
  Administered 2020-02-15 – 2020-02-16 (×2): 81 mg via ORAL
  Filled 2020-02-15 (×2): qty 1

## 2020-02-15 MED ORDER — ISOSORBIDE MONONITRATE ER 60 MG PO TB24
60.0000 mg | ORAL_TABLET | Freq: Every day | ORAL | Status: DC
  Administered 2020-02-15 – 2020-02-16 (×2): 60 mg via ORAL
  Filled 2020-02-15 (×2): qty 1

## 2020-02-15 MED ORDER — INSULIN ASPART PROT & ASPART (70-30 MIX) 100 UNIT/ML ~~LOC~~ SUSP
20.0000 [IU] | Freq: Two times a day (BID) | SUBCUTANEOUS | Status: DC
  Administered 2020-02-15: 20 [IU] via SUBCUTANEOUS
  Filled 2020-02-15: qty 10

## 2020-02-15 MED ORDER — ENSURE ENLIVE PO LIQD: 237.0000 mL | Freq: Two times a day (BID) | ORAL | Status: DC | PRN

## 2020-02-15 MED ORDER — POTASSIUM CHLORIDE CRYS ER 10 MEQ PO TBCR
10.0000 meq | EXTENDED_RELEASE_TABLET | Freq: Every day | ORAL | Status: DC
  Administered 2020-02-15 – 2020-02-16 (×2): 10 meq via ORAL
  Filled 2020-02-15 (×2): qty 1

## 2020-02-15 MED ORDER — NITROGLYCERIN 0.4 MG SL SUBL: 0.4000 mg | SUBLINGUAL_TABLET | SUBLINGUAL | Status: DC | PRN

## 2020-02-15 MED ORDER — SPIRONOLACTONE 25 MG PO TABS
25.0000 mg | ORAL_TABLET | Freq: Every day | ORAL | Status: DC
  Administered 2020-02-15 – 2020-02-16 (×2): 25 mg via ORAL
  Filled 2020-02-15 (×2): qty 1

## 2020-02-15 MED ORDER — METOLAZONE 2.5 MG PO TABS
2.5000 mg | ORAL_TABLET | Freq: Every day | ORAL | Status: DC
  Administered 2020-02-15 – 2020-02-16 (×2): 2.5 mg via ORAL
  Filled 2020-02-15 (×2): qty 1

## 2020-02-15 MED ORDER — HYDRALAZINE HCL 25 MG PO TABS
25.0000 mg | ORAL_TABLET | Freq: Three times a day (TID) | ORAL | Status: DC
  Administered 2020-02-15 (×2): 25 mg via ORAL
  Filled 2020-02-15 (×2): qty 1

## 2020-02-15 MED ORDER — POTASSIUM CHLORIDE CRYS ER 20 MEQ PO TBCR
40.0000 meq | EXTENDED_RELEASE_TABLET | Freq: Once | ORAL | Status: AC
  Administered 2020-02-15: 40 meq via ORAL
  Filled 2020-02-15: qty 2

## 2020-02-15 MED ORDER — EZETIMIBE 10 MG PO TABS
10.0000 mg | ORAL_TABLET | Freq: Every day | ORAL | Status: DC
  Administered 2020-02-15 – 2020-02-16 (×2): 10 mg via ORAL
  Filled 2020-02-15 (×2): qty 1

## 2020-02-15 NOTE — ED Notes (Signed)
O2 reduced from 4L to 3L (O2 sats at 100 for past 12 hrs).  O2 sat at 100% on 3L.

## 2020-02-15 NOTE — Progress Notes (Addendum)
Progress Note    Marisa Forbes  [id number] DOB: 1951-08-22  DOA: 02/14/2020 PCP: McLean-Scocuzza, Nino Glow, MD      Brief Narrative:    Medical records reviewed and are as summarized below:  Marisa Forbes is an 69 y.o. female with medical history significant for uncontrolled diabetes, last A1c >15, chronic combined CHF, CAD, history of CVA and breast cancer, who initially presented to the emergency room earlier in the day with intractable vomiting.  She was treated with IV antiemetics for suspected acute gastroenteritis. Her blood pressure was markedly elevated in the ER at 270/129.  She received multiple antiemetics and given IV labetalol for blood pressure and observed for several hours.  Her emesis resolved but she was noted to be confused.  CT head and MRI brain did not show any acute stroke or abnormality.  She was admitted to the hospital for acute encephalopathy, likely hypertensive encephalopathy.  She was treated with antihypertensives and blood pressure has improved.  She also has severe hyperglycemia on admission and glucose levels have improved with insulin.      Assessment/Plan:   Principal Problem:   Hypertensive encephalopathy Active Problems:   CAD (coronary artery disease)   History of completed stroke   Chronic combined systolic (congestive) and diastolic (congestive) heart failure (HCC)   Nausea and vomiting   Hypertensive emergency   Hyperglycemia due to type 2 diabetes mellitus ([place])   Hypertensive encephalopathy Mental status has improved.  CT head and MRI brain did not show any acute stroke or abnormality.    Nausea and vomiting/ ? Colitis on CT scan of abdomen and pelvis Improved.  No abdominal pain or diarrhea.  No antibiotics for now.  Nausea and vomiting may have been due to severe hypertension. Trial of heart healthy and diabetic diet Antiemetics as needed for nausea/vomiting.    Hypertensive emergency BP has improved.  Continue  antihypertensives.    Hyperglycemia due to type 2 diabetes mellitus ([place]) -Blood sugar 354 on admission.  Last HbA1c in March was over 15.  Blood sugar in the past several days were above 500 per chart review Patient said she is adherent to insulin therapy at home.  Continue insulin 70/30 and monitor glucose levels    CAD (coronary artery disease) Continue aspirin, atorvastatin and carvedilol and Nitrostat once tolerating    History of completed stroke -Continue aspirin and Zetia.     Chronic combined systolic (congestive) and diastolic (congestive) heart failure (HCC) Resume home diuretics     Hyponatremia This is chronic.  Monitor BMP   Body mass index is 16.87 kg/m.  (Underweight): Consult dietitian  Family Communication/Anticipated D/C date and plan/Code Status   DVT prophylaxis: Lovenox Code Status: Full code Family Communication: Plan discussed with patient Disposition Plan:    Status is: Inpatient  Remains inpatient appropriate because:Inpatient level of care appropriate due to severity of illness   Dispo: The patient is from: Home              Anticipated d/c is to: Home              Anticipated d/c date is: 1 day              Patient currently is not medically stable to d/c.            Subjective:   No headache, dizziness, confusion, chest pain or shortness of breath.  She feels better.  Objective:    Vitals:  02/15/20 0321 02/15/20 0727 02/15/20 1043 02/15/20 1135  BP: (!) 178/96 (!) 150/88  (!) 171/92  Pulse: 84 85  84  Resp: 18 16  18   Temp: 98.7 F (37.1 C) 97.9 F (36.6 C)  (!) 97.5 F (36.4 C)  TempSrc: Oral     SpO2: 100% 100%  100%  Weight:   46 kg   Height:        Intake/Output Summary (Last 24 hours) at 02/15/2020 1233 Last data filed at 02/15/2020 1154 Gross per 24 hour  Intake 0 ml  Output 0 ml  Net 0 ml   Filed Weights   02/14/20 1059 02/15/20 1043  Weight: 62 kg 46 kg    Exam:  GEN: NAD SKIN: No  rash EYES: EOMI ENT: MMM CV: RRR PULM: CTA B ABD: soft, ND, NT, +BS CNS: AAO x 3, non focal EXT: No edema or tenderness   Data Reviewed:   I have personally reviewed following labs and imaging studies:  Labs: Labs show the following:   Basic Metabolic Panel: Recent Labs  Lab 02/11/20 1258 02/11/20 1258 02/14/20 1103 02/15/20 0504  NA 129*  --  132* 132*  K 5.0   < > 3.8 3.4*  CL 87*  --  89* 92*  CO2 29  --  30 29  GLUCOSE 566*  --  354* 220*  BUN 26  --  30* 38*  CREATININE 0.97  --  0.76 0.68  CALCIUM 9.4  --  10.5* 9.6  MG  --   --   --  2.1  PHOS  --   --   --  3.1   < > = values in this interval not displayed.   GFR Estimated Creatinine Clearance: 48.2 mL/min (by C-G formula based on SCr of 0.68 mg/dL). Liver Function Tests: Recent Labs  Lab 02/14/20 1103  AST 28  ALT 30  ALKPHOS 101  BILITOT 1.3*  PROT 7.0  ALBUMIN 3.5   Recent Labs  Lab 02/14/20 1103  LIPASE 40   No results for input(s): AMMONIA in the last 168 hours. Coagulation profile No results for input(s): INR, PROTIME in the last 168 hours.  CBC: Recent Labs  Lab 02/14/20 1103 02/15/20 0504  WBC 10.2 9.2  HGB 16.4* 16.7*  HCT 47.7* 48.2*  MCV 84.4 84.0  PLT 245 261   Cardiac Enzymes: No results for input(s): CKTOTAL, CKMB, CKMBINDEX, TROPONINI in the last 168 hours. BNP (last 3 results) Recent Labs    01/14/20 1103 02/02/20 1356 02/11/20 1258  PROBNP 5,532* 5,273* 1,733*   CBG: Recent Labs  Lab 02/15/20 0017 02/15/20 0333 02/15/20 0732 02/15/20 1208  GLUCAP 253* 203* 212* 215*   D-Dimer: No results for input(s): DDIMER in the last 72 hours. Hgb A1c: No results for input(s): HGBA1C in the last 72 hours. Lipid Profile: No results for input(s): CHOL, HDL, LDLCALC, TRIG, CHOLHDL, LDLDIRECT in the last 72 hours. Thyroid function studies: No results for input(s): TSH, T4TOTAL, T3FREE, THYROIDAB in the last 72 hours.  Invalid input(s): FREET3 Anemia work up: No  results for input(s): VITAMINB12, FOLATE, FERRITIN, TIBC, IRON, RETICCTPCT in the last 72 hours. Sepsis Labs: Recent Labs  Lab 02/14/20 1103 02/15/20 0504  WBC 10.2 9.2    Microbiology Recent Results (from the past 240 hour(s))  SARS CORONAVIRUS 2 (TAT 6-24 HRS) Nasopharyngeal Nasopharyngeal Swab     Status: None   Collection Time: 02/14/20 11:14 PM   Specimen: Nasopharyngeal Swab  Result Value Ref Range  Status   SARS Coronavirus 2 NEGATIVE NEGATIVE Final    Comment: (NOTE) SARS-CoV-2 target nucleic acids are NOT DETECTED. The SARS-CoV-2 RNA is generally detectable in upper and lower respiratory specimens during the acute phase of infection. Negative results do not preclude SARS-CoV-2 infection, do not rule out co-infections with other pathogens, and should not be used as the sole basis for treatment or other patient management decisions. Negative results must be combined with clinical observations, patient history, and epidemiological information. The expected result is Negative. Fact Sheet for Patients: SugarRoll.be Fact Sheet for Healthcare Providers: https://www.woods-mathews.com/ This test is not yet approved or cleared by the [place] FDA and  has been authorized for detection and/or diagnosis of SARS-CoV-2 by FDA under an Emergency Use Authorization (EUA). This EUA will remain  in effect (meaning this test can be used) for the duration of the COVID-19 declaration under Section 56 4(b)(1) of the Act, 21 U.S.C. section 360bbb-3(b)(1), unless the authorization is terminated or revoked sooner. Performed at [facility], [place] [address] [place], [place] [address]     Procedures and diagnostic studies:  CT ABDOMEN PELVIS WO CONTRAST  Result Date: 02/14/2020 CLINICAL DATA:  Nausea and vomiting. EXAM: CT ABDOMEN AND PELVIS WITHOUT CONTRAST TECHNIQUE: Multidetector CT imaging of the abdomen and pelvis was performed  following the standard protocol without IV contrast. COMPARISON:  Abdominal CT 12/11/2018, MRI 12/12/2018 FINDINGS: Lower chest: Cardiomegaly. Small left pleural effusion. There are coronary artery calcifications. Hepatobiliary: No focal hepatic lesion on noncontrast exam. Layering hyperdensity in the gallbladder may be sludge or stones. No pericholecystic inflammation. No biliary dilatation. Pancreas: No ductal dilatation or inflammation. Spleen: Normal in size without focal abnormality. Adrenals/Urinary Tract: Adrenal thickening without dominant nodule. No hydronephrosis. No significant perinephric edema. Complex lesion in the lower pole left kidney measures 4.4 x 3.9 x 5.7 cm, not significantly changed from prior exam. This is previously characterized on MRI as hemorrhagic cyst. The urinary bladder is distended. No bladder wall thickening. Stomach/Bowel: Circumferential rectal wall thickening. Previous perirectal fluid collection no longer seen. No definite perirectal edema. Questionable wall thickening of the cecum. Formed stool in the ascending colon. Transverse colon is decompressed with equivocal wall thickening. Transition from decompressed transverse colon to stool-filled colon in the left abdomen, best appreciated on coronal series 5, image 26. Moderate stool in the distal transverse, descending, and sigmoid colon. Small bowel is decompressed and not well assessed. Fluid-filled stomach which is slightly prominent, but similar to prior exam. Appendix not confidently visualized. Vascular/Lymphatic: Aortic atherosclerosis. Peripheral branch atherosclerosis. No aortic aneurysm. No bulky abdominopelvic adenopathy. Reproductive: Uterus is slightly prominent but otherwise unremarkable. There periuterine vascular calcifications. No suspicious adnexal mass. Other: Generalized body wall edema. No definite ascites. No free air. Musculoskeletal: Bones are diffusely under mineralized. Scoliosis with multilevel  degenerative change in the spine. There are no acute or suspicious osseous abnormalities. IMPRESSION: 1. Areas of possible colonic wall thickening involving the cecum, transverse colon, as well as rectum. Query segmental colitis. Transition from dilated to nondilated colon at the splenic flexure, recommend direct visualization with colonoscopy to exclude underlying colonic neoplasm. 2. Distended urinary bladder. 3. Layering hyperdensity in the gallbladder may be sludge or stones. No pericholecystic inflammation. 4. Stable complex left lower pole renal lesion, previously characterized on MRI as hemorrhagic cyst. 5. Small left pleural effusion. Diffuse body wall edema suggest third-spacing. Aortic Atherosclerosis (ICD10-I70.0). Electronically Signed   By: Keith Rake M.D.   On: 02/14/2020 23:47   CT HEAD WO  CONTRAST  Result Date: 02/14/2020 CLINICAL DATA:  Headache, nausea vomiting, hypertension EXAM: CT HEAD WITHOUT CONTRAST TECHNIQUE: Contiguous axial images were obtained from the base of the skull through the vertex without intravenous contrast. COMPARISON:  12/11/2018 FINDINGS: Brain: Hypodensities throughout the periventricular and subcortical white matter are again noted, most prominent in the right occipital lobe, consistent with chronic ischemic change. No sign of acute infarct or hemorrhage. Lateral ventricles and midline structures are grossly unremarkable. Chronic right thalamic lacunar infarct again noted. No acute extra-axial fluid collections. No mass effect. Vascular: No hyperdense vessel or unexpected calcification. Skull: Normal. Negative for fracture or focal lesion. Sinuses/Orbits: There is opacification of the left sphenoid sinus. Remaining paranasal sinuses are clear. Other: None. IMPRESSION: 1. Chronic ischemic changes throughout the periventricular and subcortical white matter. 2. No acute intracranial process. 3. Left sphenoid sinus disease. Electronically Signed   By: Randa Ngo  M.D.   On: 02/14/2020 18:56   MR BRAIN WO CONTRAST  Result Date: 02/15/2020 CLINICAL DATA:  Encephalopathy EXAM: MRI HEAD WITHOUT CONTRAST TECHNIQUE: Multiplanar, multiecho pulse sequences of the brain and surrounding structures were obtained without intravenous contrast. COMPARISON:  Brain MRI 12/22/2019 FINDINGS: Brain: No acute infarct, acute hemorrhage or extra-axial collection. Early confluent hyperintense T2-weighted signal of the periventricular and deep white matter, most commonly due to chronic ischemic microangiopathy. Old right occipital lobe infarct site with encephalomalacia unchanged. Normal volume of CSF spaces. Old right cerebellar and thalamic small vessel infarct. Multifocal chronic microhemorrhage in a predominantly central distribution, consistent with chronic hypertensive angiopathy. Normal midline structures. Vascular: Normal flow voids. Skull and upper cervical spine: Normal marrow signal. Sinuses/Orbits: Chronic sphenoid sinus disease.  Normal orbits. Other: None. IMPRESSION: 1. No acute intracranial abnormality. 2. Old right occipital lobe infarct, right cerebellar and right thalamic infarcts. 3. Findings of chronic hypertensive angiopathy. Electronically Signed   By: Ulyses Jarred M.D.   On: 02/15/2020 03:22   DG CHEST PORT 1 VIEW  Result Date: 02/14/2020 CLINICAL DATA:  Altered mental status. Nausea and vomiting. EXAM: PORTABLE CHEST 1 VIEW COMPARISON:  05/29/2019 FINDINGS: Patient is rotated. Upper normal heart size. Unchanged mediastinal contours allowing for rotation. Obscuration of left hemidiaphragm likely small pleural effusion. No focal airspace disease. No pulmonary edema. No pneumothorax. Bones are under mineralized. Surgical clips project over the left breast. IMPRESSION: Obscuration of left hemidiaphragm likely small pleural effusion. Borderline cardiomegaly. Electronically Signed   By: Keith Rake M.D.   On: 02/14/2020 23:14    Medications:   . aspirin EC  81  mg Oral Daily  . atorvastatin  80 mg Oral Daily  . carvedilol  25 mg Oral BID WC  . enoxaparin (LOVENOX) injection  40 mg Subcutaneous Q24H  . ezetimibe  10 mg Oral Daily  . furosemide  80 mg Oral Daily  . hydrALAZINE  25 mg Oral Q8H  . insulin aspart  0-15 Units Subcutaneous Q4H  . insulin aspart protamine- aspart  20 Units Subcutaneous BID WC  . isosorbide mononitrate  60 mg Oral Daily  . metoCLOPramide (REGLAN) injection  10 mg Intravenous Q6H  . metolazone  2.5 mg Oral Daily  . pantoprazole (PROTONIX) IV  40 mg Intravenous QPM  . potassium chloride  10 mEq Oral Daily  . spironolactone  25 mg Oral Daily   Continuous Infusions:    LOS: 1 day   Lavante Toso  [facility]     02/15/2020, 12:33 PM

## 2020-02-15 NOTE — ED Notes (Signed)
Pt transported to MRI 

## 2020-02-15 NOTE — Evaluation (Signed)
Physical Therapy Evaluation Patient Details Name: Marisa Forbes MRN: [id number] DOB: [birth year] Today's Date: 02/15/2020   History of Present Illness  Marisa Forbes is a 69 y.o. female with medical history significant for uncontrolled diabetes, last A1c >15, chronic combined CHF, CAD, history of CVA and breast cancer, who initially presented to the emergency room earlier in the day with intractable vomiting.  She was treated with IV antiemetics for suspected acute gastroenteritis following unremarkable blood work.  Her blood pressure was markedly elevated in the ER at 270/129.  She received multiple antiemetics and given IV labetalol.  Clinical Impression  Pt is a pleasant 69 year old F who was admitted for hypertensive encephalopathy after reporting to ED with intractible vomiting. Pt refusing out of bed mobility tasks this session secondary to reports of nausea. Pt assisted with repositioning in bed, with ability to independently achieve hooklying and minimally assist to scoot to head of bed. Pt's prior level of function is short household and community distances with use of cane, though requiring use of lift chair for sit to stand or assist of second person (husband primarily). Pt also reports she is currently already receiving home health services for PT and OT due to recent functional decline/weakness, and reportedly has all necessary equipment with exception of a RW (she reports hers is broken).  Pt demonstrates deficits with strength and balance limiting ability to perform functional mobility and requiring continued skilled PT intervention.     Follow Up Recommendations Home health PT;Other (comment)(Pt is already receiving home health services for PT and OT due to functional decline/weakness)    Equipment Recommendations  Rolling walker with 5" wheels;Other (comment)(pt reporting her RW is broken)    Recommendations for Other Services       Precautions / Restrictions  Precautions Precautions: Fall      Mobility  Bed Mobility Overal bed mobility: Needs Assistance             General bed mobility comments: Able to independently assume hooklying position and assist minimally to scoot to head of bed for repositioning  Transfers                 General transfer comment: Pt refusing out of bed activities today secondary to reports of nausea  Ambulation/Gait   Gait Distance (Feet): 0 Feet         General Gait Details: Pt refusing out of bed activities today secondary to reports of nausea  Stairs            Wheelchair Mobility    Modified Rankin (Stroke Patients Only)       Balance       Sitting balance - Comments: Not formally tested as pt refuses out of mobility secondary to reports of nausea       Standing balance comment: Not formally tested as pt refuses out of mobility secondary to reports of nausea                             Pertinent Vitals/Pain Pain Assessment: No/denies pain(though reporting nausea)    Home Living Family/patient expects to be discharged to:: Private residence Living Arrangements: Spouse/significant other Available Help at Discharge: Family;Available 24 hours/day Type of Home: House Home Access: Stairs to enter Entrance Stairs-Rails: Right Entrance Stairs-Number of Steps: 4 Home Layout: One level Home Equipment: Cane - single point;Shower seat;Toilet riser;Wheelchair - [employer] - 2 wheels;Walker - 4 wheels  Prior Function Level of Independence: Independent with assistive device(s)         Comments: short household and community distances with cane; requiring use of lift chair for STS or assist of 2nd person     Hand Dominance        Extremity/Trunk Assessment   Upper Extremity Assessment Upper Extremity Assessment: Generalized weakness    Lower Extremity Assessment Lower Extremity Assessment: Generalized weakness(able to perform heel slides and  assist with scooting to top of bed)    Cervical / Trunk Assessment Cervical / Trunk Assessment: Kyphotic  Communication   Communication: No difficulties  Cognition Arousal/Alertness: Awake/alert Behavior During Therapy: WFL for tasks assessed/performed Overall Cognitive Status: Within Functional Limits for tasks assessed                                        General Comments      Exercises     Assessment/Plan    PT Assessment Patient needs continued PT services  PT Problem List Decreased strength;Decreased mobility;Decreased activity tolerance       PT Treatment Interventions DME instruction;Therapeutic exercise;Gait training;Balance training;Neuromuscular re-education;Stair training;Functional mobility training;Therapeutic activities;Patient/family education    PT Goals (Current goals can be found in the Care Plan section)  Acute Rehab PT Goals Patient Stated Goal: to feel better PT Goal Formulation: With patient/family Time For Goal Achievement: 02/29/20 Potential to Achieve Goals: Good    Frequency Min 2X/week   Barriers to discharge        Co-evaluation               AM-PAC PT "6 Clicks" Mobility  Outcome Measure Help needed turning from your back to your side while in a flat bed without using bedrails?: A Little Help needed moving from lying on your back to sitting on the side of a flat bed without using bedrails?: A Little Help needed moving to and from a bed to a chair (including a wheelchair)?: A Lot Help needed standing up from a chair using your arms (e.g., wheelchair or bedside chair)?: A Lot Help needed to walk in hospital room?: A Lot Help needed climbing 3-5 steps with a railing? : A Lot 6 Click Score: 14    End of Session Equipment Utilized During Treatment: Oxygen Activity Tolerance: Patient limited by fatigue;Other (comment)(pt limited by nausea) Patient left: in bed;with bed alarm set;with family/visitor present;with  call bell/phone within reach Nurse Communication: Mobility status(pt's refusal for OOB mobility) PT Visit Diagnosis: History of falling (Z91.81);Difficulty in walking, not elsewhere classified (R26.2);Muscle weakness (generalized) (M62.81)    Time: [id number] PT Time Calculation (min) (ACUTE ONLY): 12 min   Charges:   PT Evaluation $PT Eval Moderate Complexity: 1 Mod          Petra Kuba, PT, DPT 02/15/20, 2:27 PM

## 2020-02-16 LAB — GLUCOSE, CAPILLARY
Glucose-Capillary: 145 mg/dL — ABNORMAL HIGH (ref 70–99)
Glucose-Capillary: 135 mg/dL — ABNORMAL HIGH (ref 70–99)

## 2020-02-16 LAB — HEMOGLOBIN A1C
Hgb A1c MFr Bld: 15 % — ABNORMAL HIGH (ref 4.8–5.6)
Mean Plasma Glucose: 383.8 mg/dL

## 2020-02-16 LAB — BASIC METABOLIC PANEL
Anion gap: 11 (ref 5–15)
BUN: 52 mg/dL — ABNORMAL HIGH (ref 8–23)
CO2: 29 mmol/L (ref 22–32)
Calcium: 9.6 mg/dL (ref 8.9–10.3)
Chloride: 92 mmol/L — ABNORMAL LOW (ref 98–111)
Creatinine, Ser: 0.93 mg/dL (ref 0.44–1.00)
GFR calc Af Amer: >60 mL/min (ref >60)
GFR calc non Af Amer: >60 mL/min (ref >60)
Glucose, Bld: 140 mg/dL — ABNORMAL HIGH (ref 70–99)
Potassium: 3.8 mmol/L (ref 3.5–5.1)
Sodium: 132 mmol/L — ABNORMAL LOW (ref 135–145)

## 2020-02-16 LAB — HIV ANTIBODY (ROUTINE TESTING W REFLEX): HIV Screen 4th Generation wRfx: NONREACTIVE

## 2020-02-16 MED ORDER — FUROSEMIDE 80 MG PO TABS: 40.0000 mg | ORAL_TABLET | ORAL | Status: DC

## 2020-02-16 MED ORDER — INSULIN ASPART PROT & ASPART (70-30 MIX) 100 UNIT/ML ~~LOC~~ SUSP
15.0000 [IU] | Freq: Two times a day (BID) | SUBCUTANEOUS | Status: DC
  Administered 2020-02-16: 15 [IU] via SUBCUTANEOUS
  Filled 2020-02-16: qty 10

## 2020-02-16 NOTE — Progress Notes (Signed)
[place] attempted visit to discuss AD per OR passed on from [facility]; pt. not in rm. --does not appear to be discharged.  [place] will check back later.    02/16/20 1240  Clinical Encounter Type  Visited With Patient not available

## 2020-02-16 NOTE — TOC Transition Note (Signed)
Transition of Care [facility]) - CM/SW Discharge Note   Patient Details  Name: Marisa Forbes MRN: [id number] Date of Birth: [birth year]  Transition of Care [facility]) CM/SW Contact:  Eileen Stanford, LCSW Phone Number: 02/16/2020, 10:55 AM   Clinical Narrative:   Pt [place] services will be resumed after d/c. Pt will get a rollator--will be delivered to pt;s home address. No additional needs at this time.    Final next level of care: Home w Home Health Services Barriers to Discharge: No Barriers Identified   Patient Goals and CMS Choice     Choice offered to / list presented to : Patient  Discharge Placement                    Patient and family notified of of transfer: 02/16/20  Discharge Plan and Services In-house Referral: Clinical Social Work   Post Acute Care Choice: Home Health          DME Arranged: Gilford Rile rolling with seat DME Agency: [employer] Date DME Agency Contacted: 02/16/20 Time DME Agency Contacted: 1054 Representative spoke with at DME Agency: [place]: PT, OT [place] Agency: [name] (formerly [employer]) Date [place]: 02/16/20 Time [place]: 1054 Representative spoke with at [place]: [place] ([place]) Interventions     Readmission Risk Interventions No flowsheet data found.

## 2020-02-16 NOTE — Progress Notes (Signed)
Patient given discharge instructions with family at bedside. Patient verbalized understanding without any questions or concerns. IV taken out and tele monitor off. Patient discharging home in stable condition after eating lunch.

## 2020-02-16 NOTE — Discharge Summary (Signed)
Physician Discharge Summary  Marisa Forbes [id number] DOB: 1950/12/07 DOA: 02/14/2020  PCP: McLean-Scocuzza, Nino Glow, MD  Admit date: 02/14/2020 Discharge date: 02/16/2020  Discharge disposition: Home   Recommendations for Outpatient Follow-Up:   Outpatient follow-up with PCP in 1 week   Discharge Diagnosis:   Principal Problem:   Hypertensive encephalopathy Active Problems:   CAD (coronary artery disease)   History of completed stroke   Chronic combined systolic (congestive) and diastolic (congestive) heart failure (HCC)   Nausea and vomiting   Hypertensive emergency   Hyperglycemia due to type 2 diabetes mellitus ([place])    Discharge Condition: Stable.  Diet recommendation: Low-salt diet, low glucose diet  Code status: Full code.    Hospital Course:   Ms. Marisa Forbes is an 70 y.o. female with medical history significant foruncontrolled diabetes, last A1c>15,chronic combined CHF, CAD, history of CVA and breast cancer, who initially presented to the emergency room earlier in the day with intractable vomiting. She was treated with IV antiemetics for suspected acute gastroenteritis.Her blood pressure was markedly elevated in the ER at 270/129. She received multiple antiemetics and given IV labetalol for blood pressure and observed for several hours. Her emesis resolved but she was noted to be confused. CT head and MRI brain did not show any acute stroke or abnormality. She was admitted to the hospital for acute encephalopathy, likely hypertensive encephalopathy.  She was treated with antihypertensives and blood pressure has improved.  She also had severe hyperglycemia on admission and glucose levels have improved with insulin.   She was seen by the physical therapist and recommended home health therapy.  Her condition has improved and she is deemed stable for discharge to home.  Of note, patient said she does not take Entresto because she cannot afford it and  she confirmed that she takes lisinopril at home. The importance of medical adherence was reiterated.    Discharge Exam:   Vitals:   02/16/20 0752 02/16/20 0922  BP: 125/81   Pulse: 82 81  Resp: 17   Temp: 98.1 F (36.7 C)   SpO2: 99% 96%   Vitals:   02/15/20 2152 02/16/20 0340 02/16/20 0752 02/16/20 0922  BP:  (!) 165/96 125/81   Pulse: 79 79 82 81  Resp:   17   Temp:  97.8 F (36.6 C) 98.1 F (36.7 C)   TempSrc:  Oral    SpO2: 100% 100% 99% 96%  Weight:  46.3 kg    Height:         GEN: NAD SKIN: No rash EYES: EOMI ENT: MMM CV: RRR PULM: CTA B ABD: soft, ND, NT, +BS CNS: AAO x 3, non focal EXT: No edema or tenderness   The results of significant diagnostics from this hospitalization (including imaging, microbiology, ancillary and laboratory) are listed below for reference.     Procedures and Diagnostic Studies:   CT ABDOMEN PELVIS WO CONTRAST  Result Date: 02/14/2020 CLINICAL DATA:  Nausea and vomiting. EXAM: CT ABDOMEN AND PELVIS WITHOUT CONTRAST TECHNIQUE: Multidetector CT imaging of the abdomen and pelvis was performed following the standard protocol without IV contrast. COMPARISON:  Abdominal CT 12/11/2018, MRI 12/12/2018 FINDINGS: Lower chest: Cardiomegaly. Small left pleural effusion. There are coronary artery calcifications. Hepatobiliary: No focal hepatic lesion on noncontrast exam. Layering hyperdensity in the gallbladder may be sludge or stones. No pericholecystic inflammation. No biliary dilatation. Pancreas: No ductal dilatation or inflammation. Spleen: Normal in size without focal abnormality. Adrenals/Urinary Tract: Adrenal thickening without dominant nodule.  No hydronephrosis. No significant perinephric edema. Complex lesion in the lower pole left kidney measures 4.4 x 3.9 x 5.7 cm, not significantly changed from prior exam. This is previously characterized on MRI as hemorrhagic cyst. The urinary bladder is distended. No bladder wall thickening.  Stomach/Bowel: Circumferential rectal wall thickening. Previous perirectal fluid collection no longer seen. No definite perirectal edema. Questionable wall thickening of the cecum. Formed stool in the ascending colon. Transverse colon is decompressed with equivocal wall thickening. Transition from decompressed transverse colon to stool-filled colon in the left abdomen, best appreciated on coronal series 5, image 26. Moderate stool in the distal transverse, descending, and sigmoid colon. Small bowel is decompressed and not well assessed. Fluid-filled stomach which is slightly prominent, but similar to prior exam. Appendix not confidently visualized. Vascular/Lymphatic: Aortic atherosclerosis. Peripheral branch atherosclerosis. No aortic aneurysm. No bulky abdominopelvic adenopathy. Reproductive: Uterus is slightly prominent but otherwise unremarkable. There periuterine vascular calcifications. No suspicious adnexal mass. Other: Generalized body wall edema. No definite ascites. No free air. Musculoskeletal: Bones are diffusely under mineralized. Scoliosis with multilevel degenerative change in the spine. There are no acute or suspicious osseous abnormalities. IMPRESSION: 1. Areas of possible colonic wall thickening involving the cecum, transverse colon, as well as rectum. Query segmental colitis. Transition from dilated to nondilated colon at the splenic flexure, recommend direct visualization with colonoscopy to exclude underlying colonic neoplasm. 2. Distended urinary bladder. 3. Layering hyperdensity in the gallbladder may be sludge or stones. No pericholecystic inflammation. 4. Stable complex left lower pole renal lesion, previously characterized on MRI as hemorrhagic cyst. 5. Small left pleural effusion. Diffuse body wall edema suggest third-spacing. Aortic Atherosclerosis (ICD10-I70.0). Electronically Signed   By: Keith Rake M.D.   On: 02/14/2020 23:47   CT HEAD WO CONTRAST  Result Date:  02/14/2020 CLINICAL DATA:  Headache, nausea vomiting, hypertension EXAM: CT HEAD WITHOUT CONTRAST TECHNIQUE: Contiguous axial images were obtained from the base of the skull through the vertex without intravenous contrast. COMPARISON:  12/11/2018 FINDINGS: Brain: Hypodensities throughout the periventricular and subcortical white matter are again noted, most prominent in the right occipital lobe, consistent with chronic ischemic change. No sign of acute infarct or hemorrhage. Lateral ventricles and midline structures are grossly unremarkable. Chronic right thalamic lacunar infarct again noted. No acute extra-axial fluid collections. No mass effect. Vascular: No hyperdense vessel or unexpected calcification. Skull: Normal. Negative for fracture or focal lesion. Sinuses/Orbits: There is opacification of the left sphenoid sinus. Remaining paranasal sinuses are clear. Other: None. IMPRESSION: 1. Chronic ischemic changes throughout the periventricular and subcortical white matter. 2. No acute intracranial process. 3. Left sphenoid sinus disease. Electronically Signed   By: Randa Ngo M.D.   On: 02/14/2020 18:56   MR BRAIN WO CONTRAST  Result Date: 02/15/2020 CLINICAL DATA:  Encephalopathy EXAM: MRI HEAD WITHOUT CONTRAST TECHNIQUE: Multiplanar, multiecho pulse sequences of the brain and surrounding structures were obtained without intravenous contrast. COMPARISON:  Brain MRI 12/22/2019 FINDINGS: Brain: No acute infarct, acute hemorrhage or extra-axial collection. Early confluent hyperintense T2-weighted signal of the periventricular and deep white matter, most commonly due to chronic ischemic microangiopathy. Old right occipital lobe infarct site with encephalomalacia unchanged. Normal volume of CSF spaces. Old right cerebellar and thalamic small vessel infarct. Multifocal chronic microhemorrhage in a predominantly central distribution, consistent with chronic hypertensive angiopathy. Normal midline structures.  Vascular: Normal flow voids. Skull and upper cervical spine: Normal marrow signal. Sinuses/Orbits: Chronic sphenoid sinus disease.  Normal orbits. Other: None. IMPRESSION: 1. No acute intracranial abnormality.  2. Old right occipital lobe infarct, right cerebellar and right thalamic infarcts. 3. Findings of chronic hypertensive angiopathy. Electronically Signed   By: Ulyses Jarred M.D.   On: 02/15/2020 03:22   DG CHEST PORT 1 VIEW  Result Date: 02/14/2020 CLINICAL DATA:  Altered mental status. Nausea and vomiting. EXAM: PORTABLE CHEST 1 VIEW COMPARISON:  05/29/2019 FINDINGS: Patient is rotated. Upper normal heart size. Unchanged mediastinal contours allowing for rotation. Obscuration of left hemidiaphragm likely small pleural effusion. No focal airspace disease. No pulmonary edema. No pneumothorax. Bones are under mineralized. Surgical clips project over the left breast. IMPRESSION: Obscuration of left hemidiaphragm likely small pleural effusion. Borderline cardiomegaly. Electronically Signed   By: Keith Rake M.D.   On: 02/14/2020 23:14     Labs:   Basic Metabolic Panel: Recent Labs  Lab 02/11/20 1258 02/11/20 1258 02/14/20 1103 02/14/20 1103 02/15/20 0504 02/16/20 0457  NA 129*  --  132*  --  132* 132*  K 5.0   < > 3.8   < > 3.4* 3.8  CL 87*  --  89*  --  92* 92*  CO2 29  --  30  --  29 29  GLUCOSE 566*  --  354*  --  220* 140*  BUN 26  --  30*  --  38* 52*  CREATININE 0.97  --  0.76  --  0.68 0.93  CALCIUM 9.4  --  10.5*  --  9.6 9.6  MG  --   --   --   --  2.1  --   PHOS  --   --   --   --  3.1  --    < > = values in this interval not displayed.   GFR Estimated Creatinine Clearance: 41.7 mL/min (by C-G formula based on SCr of 0.93 mg/dL). Liver Function Tests: Recent Labs  Lab 02/14/20 1103  AST 28  ALT 30  ALKPHOS 101  BILITOT 1.3*  PROT 7.0  ALBUMIN 3.5   Recent Labs  Lab 02/14/20 1103  LIPASE 40   No results for input(s): AMMONIA in the last 168  hours. Coagulation profile No results for input(s): INR, PROTIME in the last 168 hours.  CBC: Recent Labs  Lab 02/14/20 1103 02/15/20 0504  WBC 10.2 9.2  HGB 16.4* 16.7*  HCT 47.7* 48.2*  MCV 84.4 84.0  PLT 245 261   Cardiac Enzymes: No results for input(s): CKTOTAL, CKMB, CKMBINDEX, TROPONINI in the last 168 hours. BNP: Invalid input(s): POCBNP CBG: Recent Labs  Lab 02/15/20 1645 02/15/20 2036 02/15/20 2355 02/16/20 0340 02/16/20 0753  GLUCAP 264* 209* 133* 145* 135*   D-Dimer No results for input(s): DDIMER in the last 72 hours. Hgb A1c No results for input(s): HGBA1C in the last 72 hours. Lipid Profile No results for input(s): CHOL, HDL, LDLCALC, TRIG, CHOLHDL, LDLDIRECT in the last 72 hours. Thyroid function studies No results for input(s): TSH, T4TOTAL, T3FREE, THYROIDAB in the last 72 hours.  Invalid input(s): FREET3 Anemia work up No results for input(s): VITAMINB12, FOLATE, FERRITIN, TIBC, IRON, RETICCTPCT in the last 72 hours. Microbiology Recent Results (from the past 240 hour(s))  SARS CORONAVIRUS 2 (TAT 6-24 HRS) Nasopharyngeal Nasopharyngeal Swab     Status: None   Collection Time: 02/14/20 11:14 PM   Specimen: Nasopharyngeal Swab  Result Value Ref Range Status   SARS Coronavirus 2 NEGATIVE NEGATIVE Final    Comment: (NOTE) SARS-CoV-2 target nucleic acids are NOT DETECTED. The SARS-CoV-2 RNA is generally detectable  in upper and lower respiratory specimens during the acute phase of infection. Negative results do not preclude SARS-CoV-2 infection, do not rule out co-infections with other pathogens, and should not be used as the sole basis for treatment or other patient management decisions. Negative results must be combined with clinical observations, patient history, and epidemiological information. The expected result is Negative. Fact Sheet for Patients: SugarRoll.be Fact Sheet for Healthcare  Providers: https://www.woods-mathews.com/ This test is not yet approved or cleared by the [place] FDA and  has been authorized for detection and/or diagnosis of SARS-CoV-2 by FDA under an Emergency Use Authorization (EUA). This EUA will remain  in effect (meaning this test can be used) for the duration of the COVID-19 declaration under Section 56 4(b)(1) of the Act, 21 U.S.C. section 360bbb-3(b)(1), unless the authorization is terminated or revoked sooner. Performed at [facility], [place] [address], [place], [place] [address]      Discharge Instructions:   Discharge Instructions    Diet - low sodium heart healthy   Complete by: As directed    Diet Carb Modified   Complete by: As directed    Face-to-face encounter (required for Medicare/Medicaid patients)   Complete by: As directed    I [place] certify that this patient is under my care and that I, or a nurse practitioner or physician's assistant working with me, had a face-to-face encounter that meets the physician face-to-face encounter requirements with this patient on 02/16/2020. The encounter with the patient was in whole, or in part for the following medical condition(s) which is the primary reason for home health care (List medical condition): Debility   The encounter with the patient was in whole, or in part, for the following medical condition, which is the primary reason for home health care: debility   I certify that, based on my findings, the following services are medically necessary home health services: Physical therapy   Reason for Medically Necessary Home Health Services: Therapy- Personnel officer, Public librarian   My clinical findings support the need for the above services: Unable to leave home safely without assistance and/or assistive device   Further, I certify that my clinical findings support that this patient is homebound due to: Unable to leave home safely without  assistance   For home use only DME Walker rolling   Complete by: As directed    Walker: With [place]   Patient needs a walker to treat with the following condition: [place]   Complete by: As directed    To provide the following care/treatments:  PT OT     Increase activity slowly   Complete by: As directed      Allergies as of 02/16/2020   No Known Allergies     Medication List    STOP taking these medications   Entresto 49-51 MG Generic drug: sacubitril-valsartan   polyethylene glycol 17 g packet Commonly known as: MIRALAX / GLYCOLAX     TAKE these medications   aspirin 81 MG EC tablet Take 1 tablet (81 mg total) by mouth daily.   atorvastatin 80 MG tablet Commonly known as: LIPITOR TAKE 1 TABLET BY MOUTH DAILY.   carvedilol 25 MG tablet Commonly known as: COREG TAKE 1 TABLET BY MOUTH TWICE A DAY WITH MEALS   ezetimibe 10 MG tablet Commonly known as: ZETIA TAKE 1 TABLET BY MOUTH EVERY DAY   feeding supplement (ENSURE ENLIVE) Liqd Take 237 mLs by mouth  2 (two) times daily as needed (If PO intakes of meals are poor).   furosemide 80 MG tablet Commonly known as: LASIX Take 0.5-1 tablets (40-80 mg total) by mouth See admin instructions. Take 1 tablet daily by mouth. Every third day take 2 tablets.   glucose blood test strip Commonly known as: OneTouch Verio Use TID   hydrALAZINE 10 MG tablet Commonly known as: APRESOLINE Take 1 tablet (10 mg total) by mouth 2 (two) times daily as needed. For BP >130/>80   insulin NPH Human 100 UNIT/ML injection Commonly known as: NovoLIN N ReliOn Inject 0.15 mLs (15 Units total) into the skin 2 (two) times daily before a meal.   insulin regular 100 units/mL injection Commonly known as: NovoLIN R ReliOn Inject 0.1-0.15 mLs (10-15 Units total) into the skin 3 (three) times daily before meals.   isosorbide mononitrate 60 MG 24 hr tablet Commonly known as: IMDUR TAKE 1 TABLET BY MOUTH DAILY.    lisinopril 40 MG tablet Commonly known as: ZESTRIL Take 1 tablet (40 mg total) by mouth daily.   metolazone 2.5 MG tablet Commonly known as: ZAROXOLYN Take 1 tablet (2.5 mg total) by mouth daily.   nitroGLYCERIN 0.4 MG SL tablet Commonly known as: NITROSTAT Place 1 tablet (0.4 mg total) under the tongue every 5 (five) minutes as needed for chest pain.   potassium chloride 10 MEQ tablet Commonly known as: KLOR-CON Take 1 tablet (10 mEq total) by mouth daily.   spironolactone 25 MG tablet Commonly known as: ALDACTONE TAKE 1 TABLET BY MOUTH EVERY DAY            Durable Medical Equipment  (From admission, onward)         Start     Ordered   02/16/20 0000  For home use only DME Walker rolling    Question Answer Comment  Walker: With [place] Wheels   Patient needs a walker to treat with the following condition Debility      02/16/20 1044         Follow-up Information    McLean-Scocuzza, Nino Glow, MD. Go on 02/19/2020.   Specialty: Internal Medicine Why: Appointment at [facility] information: [place] [id number] [phone]            Time coordinating discharge: 31 minutes  Signed:  [facility] 02/16/2020, 10:45 AM

## 2020-02-16 NOTE — Plan of Care (Signed)

## 2020-02-16 NOTE — Progress Notes (Signed)
Patient refused her potassium pill this morning, MD notified.

## 2020-02-16 NOTE — Progress Notes (Signed)
   02/15/20 2000  Clinical Encounter Type  Visited With Patient and family together  Visit Type Initial  Referral From Nurse  Consult/Referral To Chaplain  Spiritual Encounters  Spiritual Needs Other (Comment)  [place] completed AD education and needs to be followed up by [facility] tomorrow for completion

## 2020-02-16 NOTE — TOC Initial Note (Signed)
Transition of Care [facility]) - Initial/Assessment Note    Patient Details  Name: Marisa Forbes MRN: [id number] Date of Birth: 1950/11/24  Transition of Care [facility]) CM/SW Contact:    Eileen Stanford, LCSW Phone Number: 02/16/2020, 9:58 AM  Clinical Narrative:   Pt lives at home with spouse. Pt already gets [facility] services through [name]. Pt is agreeable to resume services through [name]. Pt's spouse will transport pt home at d/c. Pt has a walker at home.   Helene Kelp with [name] notified.                Expected Discharge Plan: [place] Barriers to Discharge: No Barriers Identified   Patient Goals and CMS Choice     Choice offered to / list presented to : Patient  Expected Discharge Plan and Services Expected Discharge Plan: [place] In-house Referral: Clinical Social Work   Post Acute Care Choice: [place] arrangements for the past 2 months: [place] Expected Discharge Date: 02/16/20                         HH Arranged: PT, OT [place] Agency: [name] (formerly [employer]) Date [place]: 02/16/20 Time [place]: [phone] Representative spoke with at [place]: [place] Arrangements/Services Living arrangements for the past 2 months: [place] Lives with:: Spouse Patient language and need for interpreter reviewed:: Yes Do you feel safe going back to the place where you live?: Yes      Need for Family Participation in Patient Care: Yes (Comment) Care giver support system in place?: Yes (comment) Current home services: Home PT, Home OT Criminal Activity/Legal Involvement Pertinent to Current Situation/Hospitalization: No - Comment as needed  Activities of Daily Living Home Assistive Devices/Equipment: Blood pressure cuff, Cane (specify quad or straight), CBG Meter, Grab bars in shower, Hand-held shower hose, Raised toilet seat with rails, Reacher, Scales, Shower chair  with back, Environmental consultant (specify type), Wheelchair, Other (Comment)(chair lift) ADL Screening (condition at time of admission) Patient's cognitive ability adequate to safely complete daily activities?: Yes Is the patient deaf or have difficulty hearing?: No Does the patient have difficulty seeing, even when wearing glasses/contacts?: No Does the patient have difficulty concentrating, remembering, or making decisions?: No Patient able to express need for assistance with ADLs?: Yes Does the patient have difficulty dressing or bathing?: Yes Independently performs ADLs?: No Communication: Independent Dressing (OT): Needs assistance Is this a change from baseline?: Pre-admission baseline Grooming: Independent Feeding: Independent Bathing: Needs assistance Is this a change from baseline?: Pre-admission baseline Toileting: Independent In/Out Bed: Independent Walks in Home: Needs assistance Is this a change from baseline?: Pre-admission baseline Does the patient have difficulty walking or climbing stairs?: Yes Weakness of Legs: Both Weakness of Arms/Hands: Both  Permission Sought/Granted Permission sought to share information with : Family Supports Permission granted to share information with : Yes, Verbal Permission Granted  Share Information with NAME: [name]  Permission granted to share info w AGENCY: [name]  Permission granted to share info w Relationship: Spouse     Emotional Assessment Appearance:: Appears stated age Attitude/Demeanor/Rapport: Engaged Affect (typically observed): Accepting, Appropriate Orientation: : Oriented to Self, Oriented to Place, Oriented to  Time, Oriented to Situation Alcohol / Substance Use: Not Applicable Psych Involvement: No (comment)  Admission diagnosis:  Confusion [R41.0] Hypertensive emergency [I16.1] Severe hypertension [I10] Acute gastritis without hemorrhage, unspecified gastritis type [K29.00]  AMS (altered mental status) [R41.82] Patient  Active Problem List   Diagnosis Date Noted  . Nausea and vomiting 02/14/2020  . Hypertensive emergency 02/14/2020  . Hypertensive encephalopathy 02/14/2020  . Hyperglycemia due to type 2 diabetes mellitus ([place]) 02/14/2020  . Weakness 01/20/2020  . Cerebrovascular accident (CVA) ([place]) 01/20/2020  . Gait abnormality 01/20/2020  . Mobility impaired 01/07/2020  . Sinusitis 12/28/2019  . Decreased activities of daily living (ADL) 12/05/2019  . Physical deconditioning 12/05/2019  . Impaired instrumental activities of daily living (IADL) 12/05/2019  . Abnormal gait 12/05/2019  . Insomnia 12/25/2018  . Peri-rectal abscess 12/12/2018  . Perirectal abscess 12/11/2018  . Left renal mass 12/11/2018  . Pleural effusion 12/11/2018  . Lower urinary tract infectious disease   . Proctitis   . Unintentional weight loss   . Chronic combined systolic (congestive) and diastolic (congestive) heart failure ([place]) 01/22/2018  . Elevated troponin 12/22/2016  . Chest pain, rule out acute myocardial infarction 07/29/2015  . 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 Poorly controlled type 2 diabetes mellitus with circulatory disorder ([place])   . Ischemic cardiomyopathy   . Congestive dilated cardiomyopathy ([place]) 04/13/2015  . 3rd cranial nerve palsy   . Diplopia   . CVA (cerebral infarction) 04/11/2015  . Renal artery stenosis ([place])   . LV dysfunction   . Benign paroxysmal positional vertigo 05/19/2013  . Cancer of upper-inner quadrant of female breast ([place]) 12/06/2012  . Cervical dystonia 10/18/2012  . Peripheral neuropathy 10/18/2012  . Other screening mammogram 10/03/2012  . History of completed stroke 09/12/2012    Class: Acute  . Hyperlipidemia with target LDL less than 70 09/09/2012  . CAD (coronary artery disease) 09/09/2012  . NSVT, 5 beats 11/10 09/09/2012  . Hypertensive heart disease with CHF (congestive heart failure) ([place]) 09/07/2012   PCP:  McLean-Scocuzza, Nino Glow, MD Pharmacy:    [id number] - [place], [place] [address] Phone: [phone] Fax: [phone]     Social Determinants of Health (SDOH) Interventions    Readmission Risk Interventions No flowsheet data found.

## 2020-02-17 ENCOUNTER — Telehealth: Payer: Self-pay | Admitting: Internal Medicine

## 2020-02-17 ENCOUNTER — Telehealth: Payer: Self-pay

## 2020-02-17 NOTE — Telephone Encounter (Signed)
faxed over notification of [place] to [name]  Pt was admitted over the weekend on 02/14/20 Faxed to [phone]

## 2020-02-17 NOTE — Telephone Encounter (Signed)
Transition Care Management Follow-up Telephone Call  Date of discharge and from where: [facility] on 02/16/20.  How have you been since you were released from the hospital? Doing better and abdominal pain has lessened. Current pain level is a 1 on a 1-10 scale. Pain is described as dull. Pt was able to sleep good last night.  Appetite has improved. Pt is still weak and nausea but not as bad. Declines fever, SOB or v/d.  Any questions or concerns? No   Items Reviewed:  Did the pt receive and understand the discharge instructions provided? Yes   Medications obtained and verified? No, declined reviewing medications at this time. Will review at [place] apt.   Any new allergies since your discharge? No   Dietary orders reviewed? Yes  Do you have support at home? Yes   Other (ie: DME, Home Health, etc): A rollator, PT and OT was ordered.  Functional Questionnaire: (I = Independent and D = Dependent)  Bathing/Dressing- Has assistance bathing.   Meal Prep- I  Eating- I  Maintaining continence- I  Transferring/Ambulation- D, uses a cane currently until receives the rollator.  Managing Meds- I   Follow up appointments reviewed:    PCP Hospital f/u appt confirmed? Yes , scheduled to see Dr. Terese Door on 02/19/20 @ 10:00 AM.  [facility] f/u appt confirmed? N/A  Are transportation arrangements needed? No   If their condition worsens, is the pt aware to call  their PCP or go to the ED? Yes  Was the patient provided with contact information for the PCP's office or ED? Yes  Was the pt encouraged to call back with questions or concerns? Yes

## 2020-02-19 ENCOUNTER — Ambulatory Visit: Payer: Medicare Other | Admitting: Internal Medicine

## 2020-02-19 ENCOUNTER — Other Ambulatory Visit: Payer: Self-pay | Admitting: Physician Assistant

## 2020-02-19 ENCOUNTER — Other Ambulatory Visit: Payer: Self-pay

## 2020-02-19 ENCOUNTER — Encounter (HOSPITAL_COMMUNITY): Payer: Self-pay | Admitting: Emergency Medicine

## 2020-02-19 ENCOUNTER — Telehealth: Payer: Self-pay | Admitting: Internal Medicine

## 2020-02-19 ENCOUNTER — Observation Stay (HOSPITAL_BASED_OUTPATIENT_CLINIC_OR_DEPARTMENT_OTHER)
Admission: EM | Admit: 2020-02-19 | Discharge: 2020-02-20 | Disposition: A | Discharge status: Home / Self Care | Payer: Medicare Other | Source: Home / Self Care | Attending: Emergency Medicine | Admitting: Emergency Medicine

## 2020-02-19 DIAGNOSIS — Z7982 Long term (current) use of aspirin: Secondary | ICD-10-CM | POA: Insufficient documentation

## 2020-02-19 DIAGNOSIS — Z743 Need for continuous supervision: Secondary | ICD-10-CM | POA: Diagnosis not present

## 2020-02-19 DIAGNOSIS — R278 Other lack of coordination: Secondary | ICD-10-CM | POA: Insufficient documentation

## 2020-02-19 DIAGNOSIS — E1159 Type 2 diabetes mellitus with other circulatory complications: Secondary | ICD-10-CM | POA: Insufficient documentation

## 2020-02-19 DIAGNOSIS — I951 Orthostatic hypotension: Secondary | ICD-10-CM | POA: Insufficient documentation

## 2020-02-19 DIAGNOSIS — Z794 Long term (current) use of insulin: Secondary | ICD-10-CM | POA: Insufficient documentation

## 2020-02-19 DIAGNOSIS — R935 Abnormal findings on diagnostic imaging of other abdominal regions, including retroperitoneum: Secondary | ICD-10-CM

## 2020-02-19 DIAGNOSIS — K2950 Unspecified chronic gastritis without bleeding: Secondary | ICD-10-CM | POA: Insufficient documentation

## 2020-02-19 DIAGNOSIS — I9589 Other hypotension: Secondary | ICD-10-CM | POA: Diagnosis not present

## 2020-02-19 DIAGNOSIS — I701 Atherosclerosis of renal artery: Secondary | ICD-10-CM | POA: Insufficient documentation

## 2020-02-19 DIAGNOSIS — E860 Dehydration: Secondary | ICD-10-CM | POA: Insufficient documentation

## 2020-02-19 DIAGNOSIS — E861 Hypovolemia: Secondary | ICD-10-CM | POA: Diagnosis not present

## 2020-02-19 DIAGNOSIS — K2100 Gastro-esophageal reflux disease with esophagitis, without bleeding: Secondary | ICD-10-CM | POA: Insufficient documentation

## 2020-02-19 DIAGNOSIS — E43 Unspecified severe protein-calorie malnutrition: Secondary | ICD-10-CM | POA: Diagnosis not present

## 2020-02-19 DIAGNOSIS — E876 Hypokalemia: Secondary | ICD-10-CM | POA: Insufficient documentation

## 2020-02-19 DIAGNOSIS — Z955 Presence of coronary angioplasty implant and graft: Secondary | ICD-10-CM | POA: Insufficient documentation

## 2020-02-19 DIAGNOSIS — K5641 Fecal impaction: Secondary | ICD-10-CM | POA: Insufficient documentation

## 2020-02-19 DIAGNOSIS — G903 Multi-system degeneration of the autonomic nervous system: Secondary | ICD-10-CM

## 2020-02-19 DIAGNOSIS — E871 Hypo-osmolality and hyponatremia: Secondary | ICD-10-CM | POA: Insufficient documentation

## 2020-02-19 DIAGNOSIS — K449 Diaphragmatic hernia without obstruction or gangrene: Secondary | ICD-10-CM | POA: Insufficient documentation

## 2020-02-19 DIAGNOSIS — Z9221 Personal history of antineoplastic chemotherapy: Secondary | ICD-10-CM | POA: Insufficient documentation

## 2020-02-19 DIAGNOSIS — E785 Hyperlipidemia, unspecified: Secondary | ICD-10-CM | POA: Insufficient documentation

## 2020-02-19 DIAGNOSIS — E1151 Type 2 diabetes mellitus with diabetic peripheral angiopathy without gangrene: Secondary | ICD-10-CM | POA: Insufficient documentation

## 2020-02-19 DIAGNOSIS — R1013 Epigastric pain: Secondary | ICD-10-CM | POA: Insufficient documentation

## 2020-02-19 DIAGNOSIS — R55 Syncope and collapse: Secondary | ICD-10-CM | POA: Diagnosis not present

## 2020-02-19 DIAGNOSIS — E1142 Type 2 diabetes mellitus with diabetic polyneuropathy: Secondary | ICD-10-CM | POA: Insufficient documentation

## 2020-02-19 DIAGNOSIS — I959 Hypotension, unspecified: Secondary | ICD-10-CM

## 2020-02-19 DIAGNOSIS — E7800 Pure hypercholesterolemia, unspecified: Secondary | ICD-10-CM | POA: Insufficient documentation

## 2020-02-19 DIAGNOSIS — I4430 Unspecified atrioventricular block: Secondary | ICD-10-CM | POA: Diagnosis not present

## 2020-02-19 DIAGNOSIS — R112 Nausea with vomiting, unspecified: Secondary | ICD-10-CM | POA: Insufficient documentation

## 2020-02-19 DIAGNOSIS — I5042 Chronic combined systolic (congestive) and diastolic (congestive) heart failure: Secondary | ICD-10-CM | POA: Insufficient documentation

## 2020-02-19 DIAGNOSIS — Z20822 Contact with and (suspected) exposure to covid-19: Secondary | ICD-10-CM | POA: Insufficient documentation

## 2020-02-19 DIAGNOSIS — I255 Ischemic cardiomyopathy: Secondary | ICD-10-CM | POA: Insufficient documentation

## 2020-02-19 DIAGNOSIS — K5649 Other impaction of intestine: Secondary | ICD-10-CM

## 2020-02-19 DIAGNOSIS — I69398 Other sequelae of cerebral infarction: Secondary | ICD-10-CM | POA: Insufficient documentation

## 2020-02-19 DIAGNOSIS — N179 Acute kidney failure, unspecified: Secondary | ICD-10-CM | POA: Insufficient documentation

## 2020-02-19 DIAGNOSIS — Z681 Body mass index (BMI) 19 or less, adult: Secondary | ICD-10-CM | POA: Diagnosis not present

## 2020-02-19 DIAGNOSIS — I252 Old myocardial infarction: Secondary | ICD-10-CM | POA: Insufficient documentation

## 2020-02-19 DIAGNOSIS — R42 Dizziness and giddiness: Secondary | ICD-10-CM | POA: Diagnosis not present

## 2020-02-19 DIAGNOSIS — I2510 Atherosclerotic heart disease of native coronary artery without angina pectoris: Secondary | ICD-10-CM | POA: Insufficient documentation

## 2020-02-19 DIAGNOSIS — Z923 Personal history of irradiation: Secondary | ICD-10-CM | POA: Insufficient documentation

## 2020-02-19 DIAGNOSIS — E1165 Type 2 diabetes mellitus with hyperglycemia: Secondary | ICD-10-CM | POA: Insufficient documentation

## 2020-02-19 DIAGNOSIS — I110 Hypertensive heart disease with heart failure: Secondary | ICD-10-CM | POA: Insufficient documentation

## 2020-02-19 DIAGNOSIS — R739 Hyperglycemia, unspecified: Secondary | ICD-10-CM

## 2020-02-19 DIAGNOSIS — R933 Abnormal findings on diagnostic imaging of other parts of digestive tract: Secondary | ICD-10-CM | POA: Insufficient documentation

## 2020-02-19 DIAGNOSIS — Z853 Personal history of malignant neoplasm of breast: Secondary | ICD-10-CM | POA: Insufficient documentation

## 2020-02-19 DIAGNOSIS — I420 Dilated cardiomyopathy: Secondary | ICD-10-CM | POA: Insufficient documentation

## 2020-02-19 DIAGNOSIS — Z79899 Other long term (current) drug therapy: Secondary | ICD-10-CM | POA: Insufficient documentation

## 2020-02-19 DIAGNOSIS — N390 Urinary tract infection, site not specified: Secondary | ICD-10-CM | POA: Diagnosis not present

## 2020-02-19 LAB — BASIC METABOLIC PANEL
Anion gap: 14 (ref 5–15)
BUN: 50 mg/dL — ABNORMAL HIGH (ref 8–23)
CO2: 30 mmol/L (ref 22–32)
Calcium: 9.5 mg/dL (ref 8.9–10.3)
Chloride: 89 mmol/L — ABNORMAL LOW (ref 98–111)
Creatinine, Ser: 1.17 mg/dL — ABNORMAL HIGH (ref 0.44–1.00)
GFR calc Af Amer: 55 mL/min — ABNORMAL LOW (ref >60)
GFR calc non Af Amer: 48 mL/min — ABNORMAL LOW (ref >60)
Glucose, Bld: 415 mg/dL — ABNORMAL HIGH (ref 70–99)
Potassium: 3.2 mmol/L — ABNORMAL LOW (ref 3.5–5.1)
Sodium: 133 mmol/L — ABNORMAL LOW (ref 135–145)

## 2020-02-19 LAB — LIPASE, BLOOD: Lipase: 61 U/L — ABNORMAL HIGH (ref 11–51)

## 2020-02-19 LAB — CBC WITH DIFFERENTIAL/PLATELET
Abs Immature Granulocytes: 0.02 10*3/uL (ref 0.00–0.07)
Basophils Absolute: 0 10*3/uL (ref 0.0–0.1)
Basophils Relative: 0 %
Eosinophils Absolute: 0 10*3/uL (ref 0.0–0.5)
Eosinophils Relative: 0 %
HCT: 45.6 % (ref 36.0–46.0)
Hemoglobin: 15.2 g/dL — ABNORMAL HIGH (ref 12.0–15.0)
Immature Granulocytes: 0 %
Lymphocytes Relative: 16 %
Lymphs Abs: 0.9 10*3/uL (ref 0.7–4.0)
MCH: 28.7 pg (ref 26.0–34.0)
MCHC: 33.3 g/dL (ref 30.0–36.0)
MCV: 86.2 fL (ref 80.0–100.0)
Monocytes Absolute: 0.6 10*3/uL (ref 0.1–1.0)
Monocytes Relative: 11 %
Neutro Abs: 4.1 10*3/uL (ref 1.7–7.7)
Neutrophils Relative %: 73 %
Platelets: 257 10*3/uL (ref 150–400)
RBC: 5.29 MIL/uL — ABNORMAL HIGH (ref 3.87–5.11)
RDW: 13.1 % (ref 11.5–15.5)
WBC: 5.7 10*3/uL (ref 4.0–10.5)
nRBC: 0 % (ref 0.0–0.2)

## 2020-02-19 LAB — HEPATIC FUNCTION PANEL
ALT: 13 U/L (ref 0–44)
AST: 15 U/L (ref 15–41)
Albumin: 2.1 g/dL — ABNORMAL LOW (ref 3.5–5.0)
Alkaline Phosphatase: 53 U/L (ref 38–126)
Bilirubin, Direct: 0.2 mg/dL (ref 0.0–0.2)
Indirect Bilirubin: 0.7 mg/dL (ref 0.3–0.9)
Total Bilirubin: 0.9 mg/dL (ref 0.3–1.2)
Total Protein: 3.8 g/dL — ABNORMAL LOW (ref 6.5–8.1)

## 2020-02-19 LAB — RESPIRATORY PANEL BY RT PCR (FLU A&B, COVID)
Influenza A by PCR: NEGATIVE
Influenza B by PCR: NEGATIVE
SARS Coronavirus 2 by RT PCR: NEGATIVE

## 2020-02-19 LAB — GLUCOSE, CAPILLARY
Glucose-Capillary: 381 mg/dL — ABNORMAL HIGH (ref 70–99)
Glucose-Capillary: 324 mg/dL — ABNORMAL HIGH (ref 70–99)

## 2020-02-19 LAB — TROPONIN I (HIGH SENSITIVITY)
Troponin I (High Sensitivity): 50 ng/L — ABNORMAL HIGH (ref <18)
Troponin I (High Sensitivity): 34 ng/L — ABNORMAL HIGH (ref <18)

## 2020-02-19 MED ORDER — ACETAMINOPHEN 650 MG RE SUPP: 650.0000 mg | Freq: Four times a day (QID) | RECTAL | Status: DC | PRN

## 2020-02-19 MED ORDER — INSULIN ASPART 100 UNIT/ML ~~LOC~~ SOLN
0.0000 [IU] | Freq: Every day | SUBCUTANEOUS | Status: DC
  Administered 2020-02-19: 4 [IU] via SUBCUTANEOUS

## 2020-02-19 MED ORDER — PEG-KCL-NACL-NASULF-NA ASC-C 100 G PO SOLR: 1.0000 | Freq: Once | ORAL | Status: DC

## 2020-02-19 MED ORDER — METOCLOPRAMIDE HCL 5 MG/ML IJ SOLN
10.0000 mg | Freq: Once | INTRAMUSCULAR | Status: AC
  Administered 2020-02-19: 10 mg via INTRAVENOUS
  Filled 2020-02-19: qty 2

## 2020-02-19 MED ORDER — INSULIN ASPART 100 UNIT/ML ~~LOC~~ SOLN
0.0000 [IU] | Freq: Three times a day (TID) | SUBCUTANEOUS | Status: DC
  Administered 2020-02-19: 15 [IU] via SUBCUTANEOUS
  Administered 2020-02-20: 5 [IU] via SUBCUTANEOUS

## 2020-02-19 MED ORDER — CARVEDILOL 25 MG PO TABS
25.0000 mg | ORAL_TABLET | Freq: Two times a day (BID) | ORAL | Status: DC
  Administered 2020-02-19 – 2020-02-20 (×2): 25 mg via ORAL
  Filled 2020-02-19 (×2): qty 1

## 2020-02-19 MED ORDER — ENSURE ENLIVE PO LIQD
237.0000 mL | ORAL | Status: DC | PRN
  Filled 2020-02-19: qty 237

## 2020-02-19 MED ORDER — ASPIRIN EC 81 MG PO TBEC
81.0000 mg | DELAYED_RELEASE_TABLET | Freq: Every day | ORAL | Status: DC
  Administered 2020-02-20: 11:00:00 81 mg via ORAL
  Filled 2020-02-19: qty 1

## 2020-02-19 MED ORDER — INSULIN GLARGINE 100 UNIT/ML ~~LOC~~ SOLN
15.0000 [IU] | Freq: Every day | SUBCUTANEOUS | Status: DC
  Administered 2020-02-19 – 2020-02-20 (×2): 15 [IU] via SUBCUTANEOUS
  Filled 2020-02-19 (×2): qty 0.15

## 2020-02-19 MED ORDER — HEPARIN SODIUM (PORCINE) 5000 UNIT/ML IJ SOLN
5000.0000 [IU] | Freq: Three times a day (TID) | INTRAMUSCULAR | Status: DC
  Administered 2020-02-19 – 2020-02-20 (×4): 5000 [IU] via SUBCUTANEOUS
  Filled 2020-02-19 (×4): qty 1

## 2020-02-19 MED ORDER — SODIUM CHLORIDE 0.9 % IV BOLUS
1000.0000 mL | Freq: Once | INTRAVENOUS | Status: AC
  Administered 2020-02-19: 1000 mL via INTRAVENOUS

## 2020-02-19 MED ORDER — INSULIN ASPART 100 UNIT/ML ~~LOC~~ SOLN
5.0000 [IU] | Freq: Three times a day (TID) | SUBCUTANEOUS | Status: DC
  Administered 2020-02-19 – 2020-02-20 (×2): 5 [IU] via SUBCUTANEOUS

## 2020-02-19 MED ORDER — ACETAMINOPHEN 325 MG PO TABS: 650.0000 mg | ORAL_TABLET | Freq: Four times a day (QID) | ORAL | Status: DC | PRN

## 2020-02-19 MED ORDER — ATORVASTATIN CALCIUM 80 MG PO TABS
80.0000 mg | ORAL_TABLET | Freq: Every day | ORAL | Status: DC
  Administered 2020-02-20: 11:00:00 80 mg via ORAL
  Filled 2020-02-19: qty 1

## 2020-02-19 MED ORDER — PEG-KCL-NACL-NASULF-NA ASC-C 100 G PO SOLR
0.5000 | Freq: Once | ORAL | Status: AC
  Administered 2020-02-20: 100 g via ORAL
  Filled 2020-02-19: qty 1

## 2020-02-19 MED ORDER — EZETIMIBE 10 MG PO TABS
10.0000 mg | ORAL_TABLET | Freq: Every day | ORAL | Status: DC
  Administered 2020-02-19: 10 mg via ORAL
  Filled 2020-02-19 (×2): qty 1

## 2020-02-19 MED ORDER — PEG-KCL-NACL-NASULF-NA ASC-C 100 G PO SOLR
0.5000 | Freq: Once | ORAL | Status: AC
  Administered 2020-02-19: 100 g via ORAL
  Filled 2020-02-19: qty 1

## 2020-02-19 MED ORDER — HYDRALAZINE HCL 25 MG PO TABS: 25.0000 mg | ORAL_TABLET | Freq: Four times a day (QID) | ORAL | Status: DC | PRN

## 2020-02-19 MED ORDER — METOCLOPRAMIDE HCL 5 MG/ML IJ SOLN
10.0000 mg | Freq: Once | INTRAMUSCULAR | Status: AC
  Administered 2020-02-20: 10 mg via INTRAVENOUS
  Filled 2020-02-19: qty 2

## 2020-02-19 MED ORDER — SODIUM CHLORIDE 0.9% FLUSH
3.0000 mL | Freq: Two times a day (BID) | INTRAVENOUS | Status: DC
  Administered 2020-02-19: 3 mL via INTRAVENOUS

## 2020-02-19 MED ORDER — POTASSIUM CHLORIDE CRYS ER 20 MEQ PO TBCR
40.0000 meq | EXTENDED_RELEASE_TABLET | Freq: Once | ORAL | Status: AC
  Administered 2020-02-19: 40 meq via ORAL
  Filled 2020-02-19: qty 2

## 2020-02-19 NOTE — H&P (Signed)
History and Physical    Marisa Forbes:[id number] DOB: 08-04-1951 DOA: 02/19/2020  PCP: McLean-Scocuzza, Nino Glow, MD   Patient coming from: Home  I have personally briefly reviewed patient's old medical records in [place]  Chief Complaint: Weak, abd pain  HPI: Marisa Forbes is a 69 y.o. 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  Patient has now been eating and drinking well since last week, she went to [facility] on Saturday for evaluation of intractable abdominal pain and vomiting.    Work-up was done, CT abdomen shows questionable colon wall thickening, no significant she was treated with IV antiemetics for suspected acute gastroenteritis following unremarkable blood work.    She was observed overnight and discharged home.  However her symptoms of abdominal pain and intractable vomiting continued.  She describes abdominal pain as epigastrically located, aching like, constant around-the-clock.  She sometimes took Tums with little help.  Denies any diarrhea.  The vomitus usually contains stomach content no blood or bile.  For the last few days she was not able to put much food or even water down, despite she continue to take her BP meds.  This morning she was on her way out and then collapsed, she denied any feeling of lightheadedness or or palpitation before this happened, no loss of consciousness, her husband was able to hold her to the floor.  No injury.   ED Course: EMS reports that her initial blood pressure was 60/40 on their arrival, with CBG 438. BP 90/67 on arrival, one liter bolus given and her BP improved to 140s/80s.  Review of Systems: As per HPI otherwise 10 point review of systems negative.    Past Medical History:  Diagnosis Date  . Arthritis    back  . Balance problems    due to  stroke  . Benign head tremor    per pt due to muscle spasm in neck   . Breast cancer ([place]) 12/03/12   a. 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 S/p chemo with CMF and radiation.  . Cataracts, bilateral   . CHF (congestive heart failure) ([place])    On Monday she said her cardiologist was concerned for possible CHF, but is now improving-per pt.(07/26/15)  . CHF (congestive heart failure) ([place])   . Coronary artery disease    a. STEMI 08/2012: s/p DES to LAD, PTCA to downstream LAD. b. Relook cath same hospitalization: stable post-PCI anatomy with Stable Diag lesions (unlikely cause of resting angina, not good PCI targets), stable RCA lesion (non flow limiting).  . Diabetes mellitus without complication ([place])    Takes insulin and metformin  . Dysplasia of cervix, low grade (CIN 1)    in 02 s/p LEEP   . GERD (gastroesophageal reflux disease)    food related  . History of cancer chemotherapy    last in August 2014  . 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 History of radiation therapy 06/23/2013-08/08/2013   62.4 gray to left breast  . HTN (hypertension)   . Hypercholesteremia   . Insomnia   . Left kidney mass   . LV dysfunction    a. EF 45-50%  at time of STEMI 08/2012. b. Echo 04/2013: EF 45-50%, mild focal basal hypertrophy of septum, grade 1 d/d.  Marland Kitchen Myocardial infarction ([place]) 09/07/12  . Peripheral vision loss    left  . Perirectal abscess    11/2018   . Pleural effusion, bilateral   . Pneumonia   . Pneumonia   . Renal artery stenosis ([facility])    a. 20% L RAS in 08/2012 by cath. b. Duplex 07/2012: >60% L RAS.  . Stroke, embolic ([place])    a. 28/3151 post cath. with residual reduced vision in left eye; noted right occiptal lobe/PCA infarct   . UTI (urinary tract infection)   . UTI  (urinary tract infection)   . Vasovagal syncope    a. 04/2013 - echo stable compared to prior EF 45-50%, negative carotid duplex.    Past Surgical History:  Procedure Laterality Date  . BACK SURGERY     x3, lower back  . BREAST BIOPSY Left 12/03/2012   U/S Core- Malignant  . BREAST LUMPECTOMY Left 01/08/2013  . BREAST LUMPECTOMY WITH NEEDLE LOCALIZATION AND AXILLARY SENTINEL LYMPH NODE BX Left 01/08/2013   Procedure: LEFT BREAST WIRE GUIDED  LUMPECTOMY AND LEFT AXILLARY SENTINEL  NODE BX;  Surgeon: Rolm Bookbinder, MD;  Location: [place];  Service: General;  Laterality: Left;  . CATARACT EXTRACTION Bilateral   . CERVICAL BIOPSY  W/ LOOP ELECTRODE EXCISION     02/2001  . CORONARY ANGIOPLASTY WITH STENT PLACEMENT    . EYE SURGERY     b/l cataract   . INCISION AND DRAINAGE PERIRECTAL ABSCESS N/A 12/11/2018   Procedure: IRRIGATION AND DEBRIDEMENT PERIRECTAL ABSCESS;  Surgeon: Johnathan Hausen, MD;  Location: [facility];  Service: General;  Laterality: N/A;  . LEFT HEART CATHETERIZATION WITH CORONARY ANGIOGRAM N/A 09/07/2012   Procedure: LEFT HEART CATHETERIZATION WITH CORONARY ANGIOGRAM;  Surgeon: Troy Sine, MD;  Location[facility] C[facility] LAB;  Service: Cardiovascular;  Laterality: N/A;  . LEFT HEART CATHETERIZATION WITH CORONARY ANGIOGRAM  09/09/2012   Procedure: LEFT HEART CATHETERIZATION WITH CORONARY ANGIOGRAM;  Surgeon: Leonie Man, MD;  Location[facility] C[facility] LAB;  Service: Cardiovascular;;  . PERCUTANEOUS CORONARY STENT INTERVENTION (PCI-S) N/A 09/07/2012   Procedure: PERCUTANEOUS CORONARY STENT INTERVENTION (PCI-S);  Surgeon: Troy Sine, MD;  Location: [facility] CATH LAB;  Service: Cardiovascular;  Laterality: N/A;  . PORT-A-CATH REMOVAL N/A 10/14/2013   Procedure: REMOVAL PORT-A-CATH;  Surgeon: Rolm Bookbinder, MD;  Location: [facility];  Service: General;  Laterality: N/A;  . PORTACATH PLACEMENT Right 02/17/2013   Procedure: INSERTION PORT-A-CATH;  Surgeon: Rolm Bookbinder, MD;  Location: [place];   Service: General;  Laterality: Right;  . THORACENTESIS     12/13/2018 atypical cells not definitely diag. of malignancy      reports that she has never smoked. She has never used smokeless tobacco. She reports previous alcohol use. She reports that she does not use drugs.  No Known Allergies  Family History  Problem Relation Age of Onset  . COPD Mother   . Heart disease Father   . Hypertension Father   . Diabetes Brother   . Hypertension Brother   . Learning disabilities Brother   . Hypertension Brother   . Asthma Daughter   . Cancer Neg Hx   . Hyperlipidemia Neg Hx   . Kidney disease Neg Hx   . Stroke Neg Hx     Prior to Admission medications   Medication Sig Start Date End Date Taking? Authorizing Provider  aspirin 81 MG EC  tablet Take 1 tablet (81 mg total) by mouth daily. 04/16/15  Yes Barton Dubois, MD  atorvastatin (LIPITOR) 80 MG tablet TAKE 1 TABLET BY MOUTH DAILY. Patient taking differently: Take 80 mg by mouth daily.  07/15/19  Yes Fay Records, MD  carvedilol (COREG) 25 MG tablet TAKE 1 TABLET BY MOUTH TWICE A DAY WITH MEALS Patient taking differently: Take 25 mg by mouth 2 (two) times daily with a meal.  07/14/19  Yes Fay Records, MD  ezetimibe (ZETIA) 10 MG tablet TAKE 1 TABLET BY MOUTH EVERY DAY Patient taking differently: Take 10 mg by mouth at bedtime.  07/15/19  Yes Fay Records, MD  feeding supplement, ENSURE ENLIVE, (ENSURE ENLIVE) LIQD Take 237 mLs by mouth 2 (two) times daily as needed (If PO intakes of meals are poor). Patient taking differently: Take 237 mLs by mouth every other day as needed (if meal intake is poor).  12/14/18  Yes Irene Pap N, DO  furosemide (LASIX) 40 MG tablet Take 80 mg by mouth in the morning.   Yes [provider]  hydrALAZINE (APRESOLINE) 10 MG tablet Take 1 tablet (10 mg total) by mouth 2 (two) times daily as needed. For BP >130/>80 Patient taking differently: Take 10 mg by mouth 2 (two) times daily as needed (For BP  >130/>80).  12/25/18  Yes McLean-Scocuzza, Nino Glow, MD  insulin NPH Human (NOVOLIN N RELION) 100 UNIT/ML injection Inject 0.15 mLs (15 Units total) into the skin 2 (two) times daily before a meal. 11/04/19  Yes Philemon Kingdom, MD  insulin regular (NOVOLIN R RELION) 100 units/mL injection Inject 0.1-0.15 mLs (10-15 Units total) into the skin 3 (three) times daily before meals. 11/04/19  Yes Philemon Kingdom, MD  isosorbide mononitrate (IMDUR) 60 MG 24 hr tablet TAKE 1 TABLET BY MOUTH DAILY. Patient taking differently: Take 60 mg by mouth daily.  07/14/19  Yes Fay Records, MD  lisinopril (ZESTRIL) 40 MG tablet Take 1 tablet (40 mg total) by mouth daily. 02/16/20  Yes Jennye Boroughs, MD  metolazone (ZAROXOLYN) 2.5 MG tablet Take 1 tablet (2.5 mg total) by mouth daily. 02/04/20 05/04/20 Yes Fay Records, MD  nitroGLYCERIN (NITROSTAT) 0.4 MG SL tablet Place 1 tablet (0.4 mg total) under the tongue every 5 (five) minutes as needed for chest pain. 04/26/18  Yes Fay Records, MD  spironolactone (ALDACTONE) 25 MG tablet TAKE 1 TABLET BY MOUTH EVERY DAY Patient taking differently: Take 25 mg by mouth daily.  01/19/20  Yes Fay Records, MD  furosemide (LASIX) 80 MG tablet Take 0.5-1 tablets (40-80 mg total) by mouth See admin instructions. Take 1 tablet daily by mouth. Every third day take 2 tablets. 02/16/20   Jennye Boroughs, MD  glucose blood [facility] VERIO) test strip Use TID 10/03/12   Janith Lima, MD  potassium chloride (KLOR-CON) 10 MEQ tablet Take 1 tablet (10 mEq total) by mouth daily. Patient not taking: Reported on 02/19/2020 02/04/20   Fay Records, MD    Physical Exam: Vitals:   02/19/20 1215 02/19/20 1300 02/19/20 1330 02/19/20 1415  BP: (!) 87/54 123/88 (!) 143/89 (!) 141/89  Pulse: 68 66 65 65  Resp: _0 Temp:      TempSrc:      SpO2: 100% 100% 100% 99%  Weight:      Height:        Constitutional: NAD, calm, comfortable Vitals:   02/19/20 1215 02/19/20 1300 02/19/20 1330  02/19/20  1415  BP: (!) 87/54 123/88 (!) 143/89 (!) 141/89  Pulse: 68 66 65 65  Resp: _0 Temp:      TempSrc:      SpO2: 100% 100% 100% 99%  Weight:      Height:       Eyes: PERRL, lids and conjunctivae normal ENMT: Mucous membranes are dry. Posterior pharynx clear of any exudate or lesions.Normal dentition.  Neck: normal, supple, no masses, no thyromegaly Respiratory: clear to auscultation bilaterally, no wheezing, no crackles. Normal respiratory effort. No accessory muscle use.  Cardiovascular: Regular rate and rhythm, no murmurs / rubs / gallops. No extremity edema. 2+ pedal pulses. No carotid bruits.  Abdomen: no tenderness, no masses palpated. No hepatosplenomegaly. Bowel sounds positive.  Musculoskeletal: no clubbing / cyanosis. No joint deformity upper and lower extremities. Good ROM, no contractures. Normal muscle tone.  Skin: no rashes, lesions, ulcers. No induration Neurologic: CN 2-12 grossly intact. Sensation intact, DTR normal. Strength 5/5 in all 4.  Psychiatric: Normal judgment and insight. Alert and oriented x 3. Normal mood.    Labs on Admission: I have personally reviewed following labs and imaging studies  CBC: Recent Labs  Lab 02/14/20 1103 02/15/20 0504 02/19/20 1100  WBC 10.2 9.2 5.7  NEUTROABS  --   --  4.1  HGB 16.4* 16.7* 15.2*  HCT 47.7* 48.2* 45.6  MCV 84.4 84.0 86.2  PLT 245 261 637   Basic Metabolic Panel: Recent Labs  Lab 02/14/20 1103 02/15/20 0504 02/16/20 0457 02/19/20 1100  NA 132* 132* 132* 133*  K 3.8 3.4* 3.8 3.2*  CL 89* 92* 92* 89*  CO2 _1 GLUCOSE 354* 220* 140* 415*  BUN 30* 38* 52* 50*  CREATININE 0.76 0.68 0.93 1.17*  CALCIUM 10.5* 9.6 9.6 9.5  MG  --  2.1  --   --   PHOS  --  3.1  --   --    GFR: Estimated Creatinine Clearance: 32.2 mL/min (A) (by C-G formula based on SCr of 1.17 mg/dL (H)). Liver Function Tests: Recent Labs  Lab 02/14/20 1103  AST 28  ALT 30  ALKPHOS 101  BILITOT 1.3*  PROT  7.0  ALBUMIN 3.5   Recent Labs  Lab 02/14/20 1103  LIPASE 40   No results for input(s): AMMONIA in the last 168 hours. Coagulation Profile: No results for input(s): INR, PROTIME in the last 168 hours. Cardiac Enzymes: No results for input(s): CKTOTAL, CKMB, CKMBINDEX, TROPONINI in the last 168 hours. BNP (last 3 results) Recent Labs    01/14/20 1103 02/02/20 1356 02/11/20 1258  PROBNP 5,532* 5,273* 1,733*   HbA1C: No results for input(s): HGBA1C in the last 72 hours. CBG: Recent Labs  Lab 02/15/20 1645 02/15/20 2036 02/15/20 2355 02/16/20 0340 02/16/20 0753  GLUCAP 264* 209* 133* 145* 135*   Lipid Profile: No results for input(s): CHOL, HDL, LDLCALC, TRIG, CHOLHDL, LDLDIRECT in the last 72 hours. Thyroid Function Tests: No results for input(s): TSH, T4TOTAL, FREET4, T3FREE, THYROIDAB in the last 72 hours. Anemia Panel: No results for input(s): VITAMINB12, FOLATE, FERRITIN, TIBC, IRON, RETICCTPCT in the last 72 hours. Urine analysis:    Component Value Date/Time   COLORURINE YELLOW 09/30/2019 1453   APPEARANCEUR Cloudy (A) 09/30/2019 1453   LABSPEC 1.020 09/30/2019 1453   PHURINE 5.5 09/30/2019 1453   GLUCOSEU >=1000 (A) 09/30/2019 1453   HGBUR LARGE (A) 09/30/2019 1453   BILIRUBINUR NEGATIVE 09/30/2019 1453   [place] 09/30/2019  1453   PROTEINUR 100 (A) 12/10/2018 2238   UROBILINOGEN 0.2 09/30/2019 1453   NITRITE NEGATIVE 09/30/2019 1453   LEUKOCYTESUR NEGATIVE 09/30/2019 1453    Radiological Exams on Admission: No results found.  EKG: Independently reviewed.  Assessment/Plan Active Problems:   Hypotension   Near syncope  Near syncope She has orthostasis in ER, exam showed she was dehydrated due to not able to eat and drink well for about 1 week.  Her BP has improved after IV bolus, vital signs stable, I will hold off further maintenance hydration, will hold Lasix for today as well and will hold lisinopril for today Check orthostatic vital  signs every shift, restart Coreg, add as needed hydralazine  Intractable abdominal pain Discussed with GI PA [place], clinically suspect patient has gastroparesis due to uncontrolled diabetes, she also has symptoms of diabetic peripheral neuropathy May need endoscopy, defer to GI service   AKI Likely related to dehydration, recheck BMP in the morning  Hypokalemia Replace and recheck in the morning  Chronic combined CHF Clinically she is still mild hypokalemia, will not restart Lasix for today, but will not keep her on continuous hydration as well as mentioned above.  IDDM Lantus 15 unit, NovoLog 5 units 3 times daily AC plus sliding scale  CAD No acute issue   DVT prophylaxis: Heparin subcu Code Status: Full code Family Communication: Discussed with husband at bedside Disposition Plan: Depends on GI work-up, once symptoms stabilized patient can be discharged home Consults called: [facility] Admission status: Telemetry observation   Lequita Halt MD [facility] Pager [phone]    02/19/2020, 2:47 PM

## 2020-02-19 NOTE — Telephone Encounter (Signed)
Called to say that patient collapse on her way to her appt today. They called asking what they needed to do. I told them to hang up and call 911

## 2020-02-19 NOTE — ED Triage Notes (Addendum)
Pt arrives via EMS for syncopal episode at home. Pt's husband caught her before hitting the floor. EMS reports initial BP 60/40, CBG 438. Pt was alert and oriented when EMS arrived. Pt complains of dizziness. Pt improved to 104/70. HR 68 regular. Denies CP/SOB. EMS gave 500 mL NS. Pt has had both covid vaccines- second vaccine two weeks ago. Pt was seen for similar episode last week of passing out.

## 2020-02-19 NOTE — H&P (View-Only) (Signed)
[facility] Consult: 2:46 PM 02/19/2020  LOS: 0 days    Referring Provider: Dr Roosevelt Locks in ED  Primary Care Physician:  McLean-Scocuzza, Nino Glow, MD Primary Gastroenterologist:  unassigned    Reason for Consultation: Colitis w/o diarrhea.   HPI: Marisa Forbes is a 69 y.o. female.  PMH IDDM.  CHF, combined EF 25 to 30%.  MI 2013.  Cardiac stent 2013.  CVA 2013 post cardiac cath.  Renal artery stenosis.  Pneumonia.  Pleural effusions.  Left breast cancer treated with lumpectomy, radiation, chemo  2014.  Cervical dysplasia, LEEP procedure 2002.  12/13/2018 thoracentesis, cyto: atypical cells not definitely diag. of malignancy.  Multiple back surgeries. Received Covid vaccinations completed 2 weeks ago. In 11/2018 patient had CT abdomen pelvis with contrast for eval rectal pain.  This showed thickening in the rectum/anus, fluid collection in region of right lower rectum concerning for perirectal abscess.  Also noted was a mixed density left kidney lesion which was slowly expanding compared with 2014.  11/2018 MRI abdomen revealed the left kidney lesion to be a hemorrhagic cyst.  As visualized, the stomach and bowel were unremarkable.  Admitted 02/14/2020  -02/16/2020 with hypertensive encephalopathy, glucose 354 270/129.  Presented with bilious vomiting, acute onset upper abd pain.  Suspected acute gastroenteritis.  LFTs, Lipase normal.  02/14/2020 CT abdomen pelvis w/o contrast showing possible wall thickening in the cecum, transverse, rectal colon.  Transition from dilated to nondilated colon at splenic flexure.  Colonoscopy recommended to exclude underlying neoplasm.  Urinary bladder distended.  Hyperdensity in the gallbladder might be stones versus sludge.  Stable cystic lesion in left kidney consistent with hemorrhagic cyst  noted on MRI.  Small left pleural effusion.  Diffuse body wall edema suggesting third spacing.  Aortic atherosclerosis.  Since leaving the hospital she has had ongoing anorexia and consuming mostly Gatorade and fluids but little solid food.  Occasional vomiting of bilious material.  Last bowel movement was 4 or 5 days ago and normally she has bowel movements daily.  Abdominal pain persists across upper abdomen/epigastrum but not severe. Not on PPI etc.  But taking all meds as prescribed. Pt collapsed on her way to MD appointment today.  EMS transported to [facility] emergency dept.  BP 60/40, CBG 438 at arrival.  Blood pressure improved to low 100s over 70. Sodium 133, potassium 3.2.  AKI at 50/1.1. WBCs normal.  Stable Hb, no anemia.  COVID-19 negative.  Prior to onset of acute GI symptoms last week, she did not have significant GI issues.  No dysphagia.  No constipation, no bleeding per rectum.  According to a note from 07/21/2019 patient declined colonoscopy.  Patient says she has never had a colonoscopy and does not recall it ever being mentioned.  Several weeks ago, prior to being treated for CHF and edema with diuretics, she was in the 130s.  She believes she weighs about 99 pounds now.  Both she and her husband were unaware of the CT findings of 4/17 and dismayed/surprised that  it's findings were not communicated to them.  Family history negative for colon cancer, ulcer disease, anemia, GI bleeding.  Social history does not consume alcohol.  Never smoked.    Past Medical History:  Diagnosis Date  . Arthritis    back  . Balance problems    due to stroke  . Benign head tremor    per pt due to muscle spasm in neck   . Breast cancer ([place]) 12/03/12   a. 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 S/p chemo with CMF and radiation.  . Cataracts, bilateral   . CHF (congestive heart failure) ([place])    On Monday she said her cardiologist was concerned for possible CHF, but  is now improving-per pt.(07/26/15)  . CHF (congestive heart failure) ([place])   . Coronary artery disease    a. STEMI 08/2012: s/p DES to LAD, PTCA to downstream LAD. b. Relook cath same hospitalization: stable post-PCI anatomy with Stable Diag lesions (unlikely cause of resting angina, not good PCI targets), stable RCA lesion (non flow limiting).  . Diabetes mellitus without complication ([place])    Takes insulin and metformin  . Dysplasia of cervix, low grade (CIN 1)    in 02 s/p LEEP   . GERD (gastroesophageal reflux disease)    food related  . History of cancer chemotherapy    last in August 2014  . 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 History of radiation therapy 06/23/2013-08/08/2013   62.4 gray to left breast  . HTN (hypertension)   . Hypercholesteremia   . Insomnia   . Left kidney mass   . LV dysfunction    a. EF 45-50% at time of STEMI 08/2012. b. Echo 04/2013: EF 45-50%, mild focal basal hypertrophy of septum, grade 1 d/d.  Marland Kitchen Myocardial infarction ([place]) 09/07/12  . Peripheral vision loss    left  . Perirectal abscess    11/2018   . Pleural effusion, bilateral   . Pneumonia   . Pneumonia   . Renal artery stenosis ([facility])    a. 20% L RAS in 08/2012 by cath. b. Duplex 07/2012: >60% L RAS.  . Stroke, embolic ([place])    a. 62/5638 post cath. with residual reduced vision in left eye; noted right occiptal lobe/PCA infarct   . UTI (urinary tract infection)   . UTI (urinary tract infection)   . Vasovagal syncope    a. 04/2013 - echo stable compared to prior EF 45-50%, negative carotid duplex.    Past Surgical History:  Procedure Laterality Date  . BACK SURGERY     x3, lower back  . BREAST BIOPSY Left 12/03/2012   U/S Core- Malignant  . BREAST LUMPECTOMY Left 01/08/2013  . BREAST LUMPECTOMY WITH NEEDLE  LOCALIZATION AND AXILLARY SENTINEL LYMPH NODE BX Left 01/08/2013   Procedure: LEFT BREAST WIRE GUIDED  LUMPECTOMY AND LEFT AXILLARY SENTINEL  NODE BX;  Surgeon: Rolm Bookbinder, MD;  Location: [facility];  Service: General;  Laterality: Left;  . CATARACT EXTRACTION Bilateral   . CERVICAL BIOPSY  W/ LOOP ELECTRODE EXCISION     02/2001  . CORONARY ANGIOPLASTY WITH STENT PLACEMENT    . EYE SURGERY     b/l cataract   . INCISION AND DRAINAGE PERIRECTAL ABSCESS N/A 12/11/2018   Procedure: IRRIGATION AND DEBRIDEMENT PERIRECTAL ABSCESS;  Surgeon: Johnathan Hausen, MD;  Location: [facility];  Service: General;  Laterality:  N/A;  . LEFT HEART CATHETERIZATION WITH CORONARY ANGIOGRAM N/A 09/07/2012   Procedure: LEFT HEART CATHETERIZATION WITH CORONARY ANGIOGRAM;  Surgeon: Troy Sine, MD;  Location: [facility] CATH LAB;  Service: Cardiovascular;  Laterality: N/A;  . LEFT HEART CATHETERIZATION WITH CORONARY ANGIOGRAM  09/09/2012   Procedure: LEFT HEART CATHETERIZATION WITH CORONARY ANGIOGRAM;  Surgeon: Leonie Man, MD;  Location: [facility] CATH LAB;  Service: Cardiovascular;;  . PERCUTANEOUS CORONARY STENT INTERVENTION (PCI-S) N/A 09/07/2012   Procedure: PERCUTANEOUS CORONARY STENT INTERVENTION (PCI-S);  Surgeon: Troy Sine, MD;  Location: [facility] CATH LAB;  Service: Cardiovascular;  Laterality: N/A;  . PORT-A-CATH REMOVAL N/A 10/14/2013   Procedure: REMOVAL PORT-A-CATH;  Surgeon: Rolm Bookbinder, MD;  Location: [facility];  Service: General;  Laterality: N/A;  . PORTACATH PLACEMENT Right 02/17/2013   Procedure: INSERTION PORT-A-CATH;  Surgeon: Rolm Bookbinder, MD;  Location: [facility];  Service: General;  Laterality: Right;  . THORACENTESIS     12/13/2018 atypical cells not definitely diag. of malignancy     Prior to Admission medications   Medication Sig Start Date End Date Taking? Authorizing Provider  aspirin 81 MG EC tablet Take 1 tablet (81 mg total) by mouth daily. 04/16/15  Yes Barton Dubois, MD  atorvastatin (LIPITOR) 80  MG tablet TAKE 1 TABLET BY MOUTH DAILY. Patient taking differently: Take 80 mg by mouth daily.  07/15/19  Yes Fay Records, MD  carvedilol (COREG) 25 MG tablet TAKE 1 TABLET BY MOUTH TWICE A DAY WITH MEALS Patient taking differently: Take 25 mg by mouth 2 (two) times daily with a meal.  07/14/19  Yes Fay Records, MD  ezetimibe (ZETIA) 10 MG tablet TAKE 1 TABLET BY MOUTH EVERY DAY Patient taking differently: Take 10 mg by mouth at bedtime.  07/15/19  Yes Fay Records, MD  feeding supplement, ENSURE ENLIVE, (ENSURE ENLIVE) LIQD Take 237 mLs by mouth 2 (two) times daily as needed (If PO intakes of meals are poor). Patient taking differently: Take 237 mLs by mouth every other day as needed (if meal intake is poor).  12/14/18  Yes Irene Pap N, DO  furosemide (LASIX) 40 MG tablet Take 80 mg by mouth in the morning.   Yes [provider]  hydrALAZINE (APRESOLINE) 10 MG tablet Take 1 tablet (10 mg total) by mouth 2 (two) times daily as needed. For BP >130/>80 Patient taking differently: Take 10 mg by mouth 2 (two) times daily as needed (For BP >130/>80).  12/25/18  Yes McLean-Scocuzza, Nino Glow, MD  insulin NPH Human (NOVOLIN N RELION) 100 UNIT/ML injection Inject 0.15 mLs (15 Units total) into the skin 2 (two) times daily before a meal. 11/04/19  Yes Philemon Kingdom, MD  insulin regular (NOVOLIN R RELION) 100 units/mL injection Inject 0.1-0.15 mLs (10-15 Units total) into the skin 3 (three) times daily before meals. 11/04/19  Yes Philemon Kingdom, MD  isosorbide mononitrate (IMDUR) 60 MG 24 hr tablet TAKE 1 TABLET BY MOUTH DAILY. Patient taking differently: Take 60 mg by mouth daily.  07/14/19  Yes Fay Records, MD  lisinopril (ZESTRIL) 40 MG tablet Take 1 tablet (40 mg total) by mouth daily. 02/16/20  Yes Jennye Boroughs, MD  metolazone (ZAROXOLYN) 2.5 MG tablet Take 1 tablet (2.5 mg total) by mouth daily. 02/04/20 05/04/20 Yes Fay Records, MD  nitroGLYCERIN (NITROSTAT) 0.4 MG SL tablet Place 1  tablet (0.4 mg total) under the tongue every 5 (five) minutes as needed for chest pain. 04/26/18  Yes Fay Records,  MD  spironolactone (ALDACTONE) 25 MG tablet TAKE 1 TABLET BY MOUTH EVERY DAY Patient taking differently: Take 25 mg by mouth daily.  01/19/20  Yes Fay Records, MD  furosemide (LASIX) 80 MG tablet Take 0.5-1 tablets (40-80 mg total) by mouth See admin instructions. Take 1 tablet daily by mouth. Every third day take 2 tablets. 02/16/20   Jennye Boroughs, MD  glucose blood [facility] VERIO) test strip Use TID 10/03/12   Janith Lima, MD  potassium chloride (KLOR-CON) 10 MEQ tablet Take 1 tablet (10 mEq total) by mouth daily. Patient not taking: Reported on 02/19/2020 02/04/20   Fay Records, MD    Scheduled Meds: . heparin  5,000 Units Subcutaneous Q8H  . insulin aspart  0-15 Units Subcutaneous TID WC  . insulin aspart  0-5 Units Subcutaneous QHS  . insulin aspart  5 Units Subcutaneous TID WC  . insulin glargine  15 Units Subcutaneous Daily  . sodium chloride flush  3 mL Intravenous Q12H   Infusions:  PRN Meds: acetaminophen **OR** acetaminophen, hydrALAZINE   Allergies as of 02/19/2020  . (No Known Allergies)    Family History  Problem Relation Age of Onset  . COPD Mother   . Heart disease Father   . Hypertension Father   . Diabetes Brother   . Hypertension Brother   . Learning disabilities Brother   . Hypertension Brother   . Asthma Daughter   . Cancer Neg Hx   . Hyperlipidemia Neg Hx   . Kidney disease Neg Hx   . Stroke Neg Hx     Social History   Socioeconomic History  . Marital status: Married    Spouse name: [name]  . Number of children: 2  . Years of education: college  . Highest education level: Not on file  Occupational History    Employer: [employer]  Tobacco Use  . Smoking status: Never Smoker  . Smokeless tobacco: Never Used  Substance and Sexual Activity  . Alcohol use: Not Currently  . Drug use: No  . Sexual activity: Yes   Other Topics Concern  . Not on file  Social History Narrative   Married   Retired    Former Optometrist    Patient has 2 children and some college education.    Owns guns, wears seat belt, safe in relationship    1 cat   Right-handed.   Caffeine use: 2 liters of soda and 2-3 cups coffee per day.   Social Determinants of Health   Financial Resource Strain:   . Difficulty of Paying Living Expenses:   Food Insecurity:   . Worried About Charity fundraiser in the Last Year:   . Arboriculturist in the Last Year:   Transportation Needs:   . Film/video editor (Medical):   Marland Kitchen Lack of Transportation (Non-Medical):   Physical Activity:   . Days of Exercise per Week:   . Minutes of Exercise per Session:   Stress:   . Feeling of Stress :   Social Connections:   . Frequency of Communication with Friends and Family:   . Frequency of Social Gatherings with Friends and Family:   . Attends Religious Services:   . Active Member of Clubs or Organizations:   . Attends Archivist Meetings:   Marland Kitchen Marital Status:   Intimate Partner Violence:   . Fear of Current or Ex-Partner:   . Emotionally Abused:   Marland Kitchen Physically Abused:   .  Sexually Abused:     REVIEW OF SYSTEMS: Constitutional: Weakness, fatigue, collapse without LOC earlier today. ENT:  No nose bleeds Pulm: No shortness of breath or cough CV:  No palpitations, no LE edema.  No angina GU:  No hematuria, no frequency, no blood in the urine. GI: See HPI. Heme: No unusual or excessive bleeding or bruising. Transfusions: None ever. Neuro:  No headaches, no peripheral tingling or numbness.  No seizures. Derm:  No itching, no rash or sores.  Endocrine:  Sugars hard to control.  No sweats or chills.  No polyuria or dysuria Immunization: Completed Moderna Covid vaccination. Travel:  None beyond local counties in last few months.    PHYSICAL EXAM: Vital signs in last 24 hours: Vitals:   02/19/20 1330 02/19/20 1415  BP:  (!) 143/89 (!) 141/89  Pulse: 65 65  Resp: 14 15  Temp:    SpO2: 100% 99%   Wt Readings from Last 3 Encounters:  02/19/20 44.9 kg  02/16/20 46.3 kg  01/20/20 62.6 kg    General: Somewhat frail, not acutely ill-appearing.  Alert, comfortable Head: No facial asymmetry or swelling.  No signs of head trauma. Eyes: No conjunctival pallor, no scleral icterus. Ears: Not hard of hearing Nose: No discharge or congestion Mouth: Good dentition.  Tongue midline.  Mucosa moist, pink, clear. Neck: No JVD, no masses, no thyromegaly. Lungs: Clear bilaterally.  No labored breathing or cough. Heart: RRR.  No MRG.  S1, S2 present. Abdomen: Nontender, nondistended soft.  No masses, HSM, bruits, hernias..   Rectal: Not performed today Musc/Skeltl: No joint redness, swelling or gross deformity. Extremities: No CCE. Neurologic: Oriented x3.  Speech is a little bit halting but accurate.  Not confused.  Essential tremor in her head.  Moves all 4 limbs, strength not tested.  No limb tremor. Skin: No rash, no sores, no significant purpura or bruising.  No telangiectasia. Nodes: No cervical adenopathy. Psych: Calm, cooperative, pleasant.  Intake/Output from previous day: No intake/output data recorded. Intake/Output this shift: Total I/O In: 1000 [IV Piggyback:1000] Out: -   LAB RESULTS: Recent Labs    02/19/20 1100  WBC 5.7  HGB 15.2*  HCT 45.6  PLT 257   BMET Lab Results  Component Value Date   NA 133 (L) 02/19/2020   NA 132 (L) 02/16/2020   NA 132 (L) 02/15/2020   K 3.2 (L) 02/19/2020   K 3.8 02/16/2020   K 3.4 (L) 02/15/2020   CL 89 (L) 02/19/2020   CL 92 (L) 02/16/2020   CL 92 (L) 02/15/2020   CO2 30 02/19/2020   CO2 29 02/16/2020   CO2 29 02/15/2020   GLUCOSE 415 (H) 02/19/2020   GLUCOSE 140 (H) 02/16/2020   GLUCOSE 220 (H) 02/15/2020   BUN 50 (H) 02/19/2020   BUN 52 (H) 02/16/2020   BUN 38 (H) 02/15/2020   CREATININE 1.17 (H) 02/19/2020   CREATININE 0.93 02/16/2020    CREATININE 0.68 02/15/2020   CALCIUM 9.5 02/19/2020   CALCIUM 9.6 02/16/2020   CALCIUM 9.6 02/15/2020   LFT No results for input(s): PROT, ALBUMIN, AST, ALT, ALKPHOS, BILITOT, BILIDIR, IBILI in the last 72 hours. PT/INR Lab Results  Component Value Date   INR 1.66 (H) 04/11/2015   INR 0.98 02/10/2013   INR 1.01 01/06/2013   Hepatitis Panel No results for input(s): HEPBSAG, HCVAB, HEPAIGM, HEPBIGM in the last 72 hours. C-Diff No components found for: CDIFF Lipase     Component Value Date/Time   LIPASE  40 02/14/2020 1103    Drugs of Abuse     Component Value Date/Time   LABOPIA NONE DETECTED 04/11/2015 1807   COCAINSCRNUR NONE DETECTED 04/11/2015 1807   LABBENZ NONE DETECTED 04/11/2015 1807   AMPHETMU NONE DETECTED 04/11/2015 1807   THCU NONE DETECTED 04/11/2015 1807   LABBARB NONE DETECTED 04/11/2015 1807     RADIOLOGY STUDIES: No results found.    IMPRESSION:   *     Colitis with concern for underlying neoplasm at level of splenic flexure.  *    Collapse earlier today.  Elevated glucose, hypotensive per EMS Just discharged from hospital elevated BP, hypertensive encephalopathy, elevated glucose.  *   Hypokalemia.  Hyponatremia but overall improved from recent admission a few days ago.  AKI.  *    CHF, seems well-compensated currently.      PLAN:     *   Will need colonoscopy for evaluation of possible colitis and to rule out colonic neoplasia. Since her blood pressure has improved and patient feels that she could complete a bowel prep with movie prep, I am going to set her up for colonoscopy tomorrow  *   Note I mentioned the colitis to pt/husband but not the splenic area bowel transition and need to rule out cancer.     Azucena Freed  02/19/2020, 2:46 PM Phone [phone]   Attending physician's note   I have taken an interval history, reviewed the chart and examined the patient. I agree with the Advanced Practitioner's note, impression and  recommendations.   Abn CT 4/17 showing ?  Neoplasm at splenic flexure vs focal colitis.  Some thickening of the cecum, transverse colon and rectum.Marland Kitchen No LGI symptoms/wt loss. H/O N/V/epi pain (resolved) Associated CHF EF 25-30%, CAD, CVA, HTN, L Br Ca s/p lumpectomy/XRT/Chemo 2014 Uncontrolled IDDM with hemoglobin A1c 15.0.  Can have gastroparesis as well.  Plan: -Proceed with EGD/colonoscopy in AM.  She thinks she can handle the preparation.  I have discussed risks and benefits. -Monitor and correct electrolytes -CT reviewed independently.   Carmell Austria, MD Velora Heckler Fabienne Bruns [phone].

## 2020-02-19 NOTE — ED Notes (Signed)
Pt complains of feeling sleepy after completing orthostatic vital signs. Pt states that she has not slept in days. No complaints of lightheadedness or dizziness. Pt is in bed.

## 2020-02-19 NOTE — Telephone Encounter (Signed)
Agree  Rec go to [facility] ED [place]

## 2020-02-19 NOTE — Telephone Encounter (Addendum)
Called patient's cell number and daughter answered. Informed us that she has been sent to [facility] in the ambulance.   Canceled patient's appointment for today at 7 so that she will not be no-showed.

## 2020-02-19 NOTE — ED Provider Notes (Signed)
[facility] DEPARTMENT Provider Note   CSN: [id number] Arrival date & time: 02/19/20  1030     History Chief Complaint  Patient presents with  . Loss of Consciousness    Marisa Forbes is a 69 y.o. female with a history of malignant hypertension, congestive heart failure, coronary artery disease, type 2 diabetes on insulin (combination insulin, 15 R + 15 N, TID with meals), reflux, presented to the emergency department with syncope versus near syncope episode today.  EMS reports that the patient had a syncopal episode at home when she became lightheaded and her husband caught her before falling and hitting the floor.  EMS reports that her initial blood pressure was 60/40 on their arrival, with CBG 438.  Patient was complaining of dizziness to them.  She also complaining of weakness in her legs.  Her blood pressure improved to 104/70.   Currently the emergency department the patient continues to feel like her legs are weak.  Patient was just discharged from the hospital after a 3 day stay on 02/16/20, which was 3 days ago.  She was admitted to the hospital for hypertensive encephalopathy at that time, with initial BP in the ER 270/129.  She was treated with antihypertensive medications.  She was also treated with insulin for severe hyperglycemia.  She had an MRI of the brain done at that time which showed old infarcts but no acute changes.  She was seen by physical therapist and recommended for home health therapy.  At that time she was also complaining of abdominal pain a CT scan of the abdomen which showed areas of colonic wall thickening of the cecum and transverse colon, and possible sludge in the gallbladder.  She reports to me that she has not had much of an appetite since leaving the hospital.  She says she has mostly been drinking Gatorade and fluids but not eating much.  She continues to give herself her insulin as prescribed.  Of note, the patient reports that  she did get both doses of her Covid vaccine, most recently 2 weeks ago.  On review of her home meds, the patient confirms that she is taking - Coreg 25 mg BID - Lasix 80 mg DAILY - Hydralazine 10 mg once daily (different than prescribed, not BID) - Lisinopril 40 mg daily - Aldactone 25 mg daily - Isosorbide 60 daily  Of these BP medications, it appears that lasix, lisinopril, and aldactone were started within the past 2 months.  She is NOT taking her potassium supplements because "I thought they were making me sick."  HPI     Past Medical History:  Diagnosis Date  . Arthritis    back  . Balance problems    due to stroke  . Benign head tremor    per pt due to muscle spasm in neck   . Breast cancer ([place]) 12/03/12   a. 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 S/p chemo with CMF and radiation.  . Cataracts, bilateral   . CHF (congestive heart failure) ([place])    On Monday she said her cardiologist was concerned for possible CHF, but is now improving-per pt.(07/26/15)  . Coronary artery disease    a. STEMI 08/2012: s/p DES to LAD, PTCA to downstream LAD. b. Relook cath same hospitalization: stable post-PCI anatomy with Stable Diag lesions (unlikely cause of resting angina, not good PCI targets), stable RCA lesion (non flow limiting).  . Diabetes mellitus without complication ([place])  Takes insulin and metformin  . Dysplasia of cervix, low grade (CIN 1)    in 02 s/p LEEP   . GERD (gastroesophageal reflux disease)    food related  . History of cancer chemotherapy    last in August 2014  . 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 History of radiation therapy 06/23/2013-08/08/2013   62.4 gray to left breast  . HTN (hypertension)   . Hypercholesteremia   . Insomnia   . Left kidney  mass   . LV dysfunction    a. EF 45-50% at time of STEMI 08/2012. b. Echo 04/2013: EF 45-50%, mild focal basal hypertrophy of septum, grade 1 d/d.  Marland Kitchen Myocardial infarction ([place]) 09/07/12  . Peripheral vision loss    left  . Perirectal abscess    11/2018   . Pleural effusion, bilateral   . Pneumonia   . Renal artery stenosis ([facility])    a. 20% L RAS in 08/2012 by cath. b. Duplex 07/2012: >60% L RAS.  . 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 11/6376 post cath. with residual reduced vision in left eye; noted right occiptal lobe/PCA infarct   . UTI (urinary tract infection)   . UTI (urinary tract infection)   . Vasovagal syncope    a. 04/2013 - echo stable compared to prior EF 45-50%, negative carotid duplex.    Patient Active Problem List   Diagnosis Date Noted  . Hypotension 02/19/2020  . Near syncope 02/19/2020  . Nausea and vomiting 02/14/2020  . Hypertensive emergency 02/14/2020  . Hypertensive encephalopathy 02/14/2020  . Hyperglycemia due to type 2 diabetes mellitus ([place]) 02/14/2020  . Weakness 01/20/2020  . Cerebrovascular accident (CVA) ([place]) 01/20/2020  . Gait abnormality 01/20/2020  . Mobility impaired 01/07/2020  . Sinusitis 12/28/2019  . Decreased activities of daily living (ADL) 12/05/2019  . Physical deconditioning 12/05/2019  . Impaired instrumental activities of daily living (IADL) 12/05/2019  . Abnormal gait 12/05/2019  . Insomnia 12/25/2018  . Peri-rectal abscess 12/12/2018  . Perirectal abscess 12/11/2018  . Left renal mass 12/11/2018  . Pleural effusion 12/11/2018  . Lower urinary tract infectious disease   . Proctitis   . Unintentional weight loss   . Chronic combined systolic (congestive) and diastolic (congestive) heart failure ([place]) 01/22/2018  . Elevated troponin 12/22/2016  . Chest pain, rule out acute myocardial infarction 07/29/2015  . 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 Poorly controlled type 2 diabetes mellitus with circulatory disorder ([place])   . Ischemic  cardiomyopathy   . 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 3rd cranial nerve palsy   . Diplopia   . CVA (cerebral infarction) 04/11/2015  . Renal artery stenosis ([place])   . LV dysfunction   . Benign paroxysmal positional vertigo 05/19/2013  . Cancer of upper-inner quadrant of female breast ([place]) 12/06/2012  . Cervical dystonia 10/18/2012  . Peripheral neuropathy 10/18/2012  . Other screening mammogram 10/03/2012  . History of completed stroke 09/12/2012    Class: Acute  . Hyperlipidemia with target LDL less than 70 09/09/2012  . CAD (coronary artery disease) 09/09/2012  . NSVT, 5 beats 11/10 09/09/2012  . Hypertensive heart disease with CHF (congestive heart failure) ([place]) 09/07/2012    Past Surgical History:  Procedure Laterality Date  . BACK SURGERY     x3, lower back  . BREAST BIOPSY Left 12/03/2012  U/S Core- Malignant  . BREAST LUMPECTOMY Left 01/08/2013  . BREAST LUMPECTOMY WITH NEEDLE LOCALIZATION AND AXILLARY SENTINEL LYMPH NODE BX Left 01/08/2013   Procedure: LEFT BREAST WIRE GUIDED  LUMPECTOMY AND LEFT AXILLARY SENTINEL  NODE BX;  Surgeon: Rolm Bookbinder, MD;  Location: [facility];  Service: General;  Laterality: Left;  . CATARACT EXTRACTION Bilateral   . CERVICAL BIOPSY  W/ LOOP ELECTRODE EXCISION     02/2001  . CORONARY ANGIOPLASTY WITH STENT PLACEMENT    . EYE SURGERY     b/l cataract   . INCISION AND DRAINAGE PERIRECTAL ABSCESS N/A 12/11/2018   Procedure: IRRIGATION AND DEBRIDEMENT PERIRECTAL ABSCESS;  Surgeon: Johnathan Hausen, MD;  Location: [facility];  Service: General;  Laterality: N/A;  . LEFT HEART CATHETERIZATION WITH CORONARY ANGIOGRAM N/A 09/07/2012   Procedure: LEFT HEART CATHETERIZATION WITH CORONARY ANGIOGRAM;  Surgeon: Troy Sine, MD;  Location[facility];  Service: Cardiovascular;  Laterality: N/A;  . LEFT HEART CATHETERIZATION WITH CORONARY ANGIOGRAM  09/09/2012   Procedure: LEFT HEART CATHETERIZATION WITH CORONARY ANGIOGRAM;  Surgeon:  Leonie Man, MD;  Location: [facility] CATH LAB;  Service: Cardiovascular;;  . PERCUTANEOUS CORONARY STENT INTERVENTION (PCI-S) N/A 09/07/2012   Procedure: PERCUTANEOUS CORONARY STENT INTERVENTION (PCI-S);  Surgeon: Troy Sine, MD;  Location: [facility] C[facility] LAB;  Service: Cardiovascular;  Laterality: N/A;  . PORT-A-CATH REMOVAL N/A 10/14/2013   Procedure: REMOVAL PORT-A-CATH;  Surgeon: Rolm Bookbinder, MD;  Location: [facility];  Service: General;  Laterality: N/A;  . PORTACATH PLACEMENT Right 02/17/2013   Procedure: INSERTION PORT-A-CATH;  Surgeon: Rolm Bookbinder, MD;  Location: [facility];  Service: General;  Laterality: Right;  . THORACENTESIS     12/13/2018 atypical cells not definitely diag. of malignancy      OB History   No obstetric history on file.     Family History  Problem Relation Age of Onset  . COPD Mother   . Heart disease Father   . Hypertension Father   . Diabetes Brother   . Hypertension Brother   . Learning disabilities Brother   . Hypertension Brother   . Asthma Daughter   . Cancer Neg Hx   . Hyperlipidemia Neg Hx   . Kidney disease Neg Hx   . Stroke Neg Hx     Social History   Tobacco Use  . Smoking status: Never Smoker  . Smokeless tobacco: Never Used  Substance Use Topics  . Alcohol use: Not Currently  . Drug use: No    Home Medications Prior to Admission medications   Medication Sig Start Date End Date Taking? Authorizing Provider  aspirin 81 MG EC tablet Take 1 tablet (81 mg total) by mouth daily. 04/16/15  Yes Barton Dubois, MD  atorvastatin (LIPITOR) 80 MG tablet TAKE 1 TABLET BY MOUTH DAILY. Patient taking differently: Take 80 mg by mouth daily.  07/15/19  Yes Fay Records, MD  carvedilol (COREG) 25 MG tablet TAKE 1 TABLET BY MOUTH TWICE A DAY WITH MEALS Patient taking differently: Take 25 mg by mouth 2 (two) times daily with a meal.  07/14/19  Yes Fay Records, MD  ezetimibe (ZETIA) 10 MG tablet TAKE 1 TABLET BY MOUTH EVERY DAY Patient taking  differently: Take 10 mg by mouth at bedtime.  07/15/19  Yes Fay Records, MD  feeding supplement, ENSURE ENLIVE, (ENSURE ENLIVE) LIQD Take 237 mLs by mouth 2 (two) times daily as needed (If PO intakes of meals are poor). Patient taking differently: Take 237 mLs  by mouth every other day as needed (if meal intake is poor).  12/14/18  Yes Irene Pap N, DO  furosemide (LASIX) 40 MG tablet Take 80 mg by mouth in the morning.   Yes [provider]  hydrALAZINE (APRESOLINE) 10 MG tablet Take 1 tablet (10 mg total) by mouth 2 (two) times daily as needed. For BP >130/>80 Patient taking differently: Take 10 mg by mouth 2 (two) times daily as needed (For BP >130/>80).  12/25/18  Yes McLean-Scocuzza, Nino Glow, MD  insulin NPH Human (NOVOLIN N RELION) 100 UNIT/ML injection Inject 0.15 mLs (15 Units total) into the skin 2 (two) times daily before a meal. 11/04/19  Yes Philemon Kingdom, MD  insulin regular (NOVOLIN R RELION) 100 units/mL injection Inject 0.1-0.15 mLs (10-15 Units total) into the skin 3 (three) times daily before meals. 11/04/19  Yes Philemon Kingdom, MD  isosorbide mononitrate (IMDUR) 60 MG 24 hr tablet TAKE 1 TABLET BY MOUTH DAILY. Patient taking differently: Take 60 mg by mouth daily.  07/14/19  Yes Fay Records, MD  lisinopril (ZESTRIL) 40 MG tablet Take 1 tablet (40 mg total) by mouth daily. 02/16/20  Yes Jennye Boroughs, MD  metolazone (ZAROXOLYN) 2.5 MG tablet Take 1 tablet (2.5 mg total) by mouth daily. 02/04/20 05/04/20 Yes Fay Records, MD  nitroGLYCERIN (NITROSTAT) 0.4 MG SL tablet Place 1 tablet (0.4 mg total) under the tongue every 5 (five) minutes as needed for chest pain. 04/26/18  Yes Fay Records, MD  spironolactone (ALDACTONE) 25 MG tablet TAKE 1 TABLET BY MOUTH EVERY DAY Patient taking differently: Take 25 mg by mouth daily.  01/19/20  Yes Fay Records, MD  furosemide (LASIX) 80 MG tablet Take 0.5-1 tablets (40-80 mg total) by mouth See admin instructions. Take 1 tablet daily by  mouth. Every third day take 2 tablets. 02/16/20   Jennye Boroughs, MD  glucose blood [facility] VERIO) test strip Use TID 10/03/12   Janith Lima, MD  potassium chloride (KLOR-CON) 10 MEQ tablet Take 1 tablet (10 mEq total) by mouth daily. Patient not taking: Reported on 02/19/2020 02/04/20   Fay Records, MD    Allergies    Patient has no known allergies.  Review of Systems   Review of Systems  Constitutional: Negative for chills and fever.  HENT: Negative for ear pain and sore throat.   Eyes: Negative for pain and visual disturbance.  Respiratory: Positive for shortness of breath. Negative for cough.   Cardiovascular: Negative for chest pain and palpitations.  Gastrointestinal: Negative for abdominal pain, nausea and vomiting.  Genitourinary: Negative for dysuria and hematuria.  Musculoskeletal: Negative for arthralgias and back pain.  Skin: Negative for color change and rash.  Neurological: Positive for syncope, weakness and light-headedness. Negative for seizures.  All other systems reviewed and are negative.   Physical Exam Updated Vital Signs BP (!) 143/83 (BP Location: Right Arm)   Pulse 68   Temp (!) 97.5 F (36.4 C) (Oral)   Resp 18   Ht 5' 5" (1.651 m)   Wt 44.9 kg   SpO2 96%   BMI 16.47 kg/m   Physical Exam Vitals and nursing note reviewed.  Constitutional:      General: She is not in acute distress.    Appearance: She is well-developed.     Comments: Thin  HENT:     Head: Normocephalic and atraumatic.  Eyes:     Conjunctiva/sclera: Conjunctivae normal.  Cardiovascular:     Rate and Rhythm:  Normal rate and regular rhythm.     Pulses: Normal pulses.     Heart sounds: No murmur.  Pulmonary:     Effort: Pulmonary effort is normal. No respiratory distress.     Breath sounds: Normal breath sounds.  Abdominal:     General: There is no distension.     Palpations: Abdomen is soft.     Tenderness: There is no abdominal tenderness.  Musculoskeletal:      Cervical back: Neck supple.  Skin:    General: Skin is warm and dry.  Neurological:     General: No focal deficit present.     Mental Status: She is alert and oriented to person, place, and time.     Sensory: No sensory deficit.     Motor: No weakness.     ED Results / Procedures / Treatments   Labs (all labs ordered are listed, but only abnormal results are displayed) Labs Reviewed  CBC WITH DIFFERENTIAL/PLATELET - Abnormal; Notable for the following components:      Result Value   RBC 5.29 (*)    Hemoglobin 15.2 (*)    All other components within normal limits  BASIC METABOLIC PANEL - Abnormal; Notable for the following components:   Sodium 133 (*)    Potassium 3.2 (*)    Chloride 89 (*)    Glucose, Bld 415 (*)    BUN 50 (*)    Creatinine, Ser 1.17 (*)    GFR calc non Af Amer 48 (*)    GFR calc Af Amer 55 (*)    All other components within normal limits  LIPASE, BLOOD - Abnormal; Notable for the following components:   Lipase 61 (*)    All other components within normal limits  HEPATIC FUNCTION PANEL - Abnormal; Notable for the following components:   Total Protein 3.8 (*)    Albumin 2.1 (*)    All other components within normal limits  GLUCOSE, CAPILLARY - Abnormal; Notable for the following components:   Glucose-Capillary 381 (*)    All other components within normal limits  TROPONIN I (HIGH SENSITIVITY) - Abnormal; Notable for the following components:   Troponin I (High Sensitivity) 50 (*)    All other components within normal limits  TROPONIN I (HIGH SENSITIVITY) - Abnormal; Notable for the following components:   Troponin I (High Sensitivity) 34 (*)    All other components within normal limits  RESPIRATORY PANEL BY RT PCR (FLU A&B, COVID)  BASIC METABOLIC PANEL    EKG None  Radiology No results found.  Procedures Procedures (including critical care time)  Medications Ordered in ED Medications  sodium chloride flush (NS) 0.9 % injection 3 mL (3 mLs  Intravenous Not Given 02/19/20 1441)  heparin injection 5,000 Units (5,000 Units Subcutaneous Given 02/19/20 1539)  acetaminophen (TYLENOL) tablet 650 mg (has no administration in time range)    Or  acetaminophen (TYLENOL) suppository 650 mg (has no administration in time range)  insulin aspart (novoLOG) injection 0-15 Units (has no administration in time range)  insulin aspart (novoLOG) injection 0-5 Units (has no administration in time range)  insulin glargine (LANTUS) injection 15 Units (15 Units Subcutaneous Given 02/19/20 1539)  insulin aspart (novoLOG) injection 5 Units (has no administration in time range)  hydrALAZINE (APRESOLINE) tablet 25 mg (has no administration in time range)  aspirin EC tablet 81 mg (has no administration in time range)  atorvastatin (LIPITOR) tablet 80 mg (has no administration in time range)  carvedilol (COREG) tablet 25  mg (has no administration in time range)  ezetimibe (ZETIA) tablet 10 mg (has no administration in time range)  feeding supplement (ENSURE ENLIVE) (ENSURE ENLIVE) liquid 237 mL (has no administration in time range)  metoCLOPramide (REGLAN) injection 10 mg (has no administration in time range)    Followed by  metoCLOPramide (REGLAN) injection 10 mg (has no administration in time range)  peg 3350 powder (MOVIPREP) kit 100 g (has no administration in time range)    And  peg 3350 powder (MOVIPREP) kit 100 g (has no administration in time range)  sodium chloride 0.9 % bolus 1,000 mL (0 mLs Intravenous Stopped 02/19/20 1401)  potassium chloride SA (KLOR-CON) CR tablet 40 mEq (40 mEq Oral Given 02/19/20 1539)    ED Course  I have reviewed the triage vital signs and the nursing notes.  Pertinent labs & imaging results that were available during my care of the patient were reviewed by me and considered in my medical decision making (see chart for details).  70 yo female presenting here with near-syncope, hypotension, and leg weakness Differential is  broad and includes ACS vs hypovolemia (dehydration) vs anemia vs electrolyte derangement vs orthostatic hypotension vs other.  Less likely stroke, aortic vascular injury, or PE based on her history and clinical exam  Workup notable for significant hypotension during orthostatic VS, BP dropping to 09'W systolic.  Suspect this is the cause of her symptoms, and likely iatrogenic given her extensive BP regimen and IV diuresis regimen at home.  She also had been eating and drinking very little, with prior CT abdomen showing diffuse wall thickening of the colon, and she reports constipation for several days. We'll give fluids here, admit.  Labs notable also for hyperglycemia here, no anion gap.  I personally ordered and reviewed the patients labs, imaging,  and prior medical records, including her most recent hospitalization.   Clinical Course as of Feb 18 1738  Thu Feb 19, 2020  1211 Orthostatic VS positive, BP drops to 65/48 with standing.  Ordered IVF.  I suspect this is due to hypovolemia from her diuretics or iatrogenic from her multiple BP medications   [MT]  1337 Will admit to hospitalist for hyperglycemia, profound othostatic hypotension, BP med reconciliation, worsening kidney function.   [MT]  1339 Troponin I (High Sensitivity)(!): 50 [MT]  1340 Doubt this is ACS at this time   [MT]  1353 Signed out to Dr Roosevelt Locks, patient agreeable to staying in the hospital   [MT]    Clinical Course User Index [MT] , Carola Rhine, MD    Final Clinical Impression(s) / ED Diagnoses Final diagnoses:  Hypotension due to hypovolemia  Near syncope  Hyperglycemia    Rx / DC Orders ED Discharge Orders    None       Wyvonnia Dusky, MD 02/19/20 [phone]

## 2020-02-19 NOTE — Consult Note (Addendum)
[facility] Consult: 2:46 PM 02/19/2020  LOS: 0 days    Referring Provider: Dr Roosevelt Locks in ED  Primary Care Physician:  McLean-Scocuzza, Nino Glow, MD Primary Gastroenterologist:  unassigned    Reason for Consultation: Colitis w/o diarrhea.   HPI: Marisa Forbes is a 69 y.o. female.  PMH IDDM.  CHF, combined EF 25 to 30%.  MI 2013.  Cardiac stent 2013.  CVA 2013 post cardiac cath.  Renal artery stenosis.  Pneumonia.  Pleural effusions.  Left breast cancer treated with lumpectomy, radiation, chemo  2014.  Cervical dysplasia, LEEP procedure 2002.  12/13/2018 thoracentesis, cyto: atypical cells not definitely diag. of malignancy.  Multiple back surgeries. Received Covid vaccinations completed 2 weeks ago. In 11/2018 patient had CT abdomen pelvis with contrast for eval rectal pain.  This showed thickening in the rectum/anus, fluid collection in region of right lower rectum concerning for perirectal abscess.  Also noted was a mixed density left kidney lesion which was slowly expanding compared with 2014.  11/2018 MRI abdomen revealed the left kidney lesion to be a hemorrhagic cyst.  As visualized, the stomach and bowel were unremarkable.  Admitted 02/14/2020  -02/16/2020 with hypertensive encephalopathy, glucose 354 270/129.  Presented with bilious vomiting, acute onset upper abd pain.  Suspected acute gastroenteritis.  LFTs, Lipase normal.  02/14/2020 CT abdomen pelvis w/o contrast showing possible wall thickening in the cecum, transverse, rectal colon.  Transition from dilated to nondilated colon at splenic flexure.  Colonoscopy recommended to exclude underlying neoplasm.  Urinary bladder distended.  Hyperdensity in the gallbladder might be stones versus sludge.  Stable cystic lesion in left kidney consistent with hemorrhagic cyst  noted on MRI.  Small left pleural effusion.  Diffuse body wall edema suggesting third spacing.  Aortic atherosclerosis.  Since leaving the hospital she has had ongoing anorexia and consuming mostly Gatorade and fluids but little solid food.  Occasional vomiting of bilious material.  Last bowel movement was 4 or 5 days ago and normally she has bowel movements daily.  Abdominal pain persists across upper abdomen/epigastrum but not severe. Not on PPI etc.  But taking all meds as prescribed. Pt collapsed on her way to MD appointment today.  EMS transported to [facility] emergency dept.  BP 60/40, CBG 438 at arrival.  Blood pressure improved to low 100s over 70. Sodium 133, potassium 3.2.  AKI at 50/1.1. WBCs normal.  Stable Hb, no anemia.  COVID-19 negative.  Prior to onset of acute GI symptoms last week, she did not have significant GI issues.  No dysphagia.  No constipation, no bleeding per rectum.  According to a note from 07/21/2019 patient declined colonoscopy.  Patient says she has never had a colonoscopy and does not recall it ever being mentioned.  Several weeks ago, prior to being treated for CHF and edema with diuretics, she was in the 130s.  She believes she weighs about 99 pounds now.  Both she and her husband were unaware of the CT findings of 4/17 and dismayed/surprised that  it's findings were not communicated to them.  Family history negative for colon cancer, ulcer disease, anemia, GI bleeding.  Social history does not consume alcohol.  Never smoked.    Past Medical History:  Diagnosis Date  . Arthritis    back  . Balance problems    due to stroke  . Benign head tremor    per pt due to muscle spasm in neck   . Breast cancer ([place]) 12/03/12   a. 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 S/p chemo with CMF and radiation.  . Cataracts, bilateral   . CHF (congestive heart failure) ([place])    On Monday she said her cardiologist was concerned for possible CHF, but  is now improving-per pt.(07/26/15)  . CHF (congestive heart failure) ([place])   . Coronary artery disease    a. STEMI 08/2012: s/p DES to LAD, PTCA to downstream LAD. b. Relook cath same hospitalization: stable post-PCI anatomy with Stable Diag lesions (unlikely cause of resting angina, not good PCI targets), stable RCA lesion (non flow limiting).  . Diabetes mellitus without complication ([place])    Takes insulin and metformin  . Dysplasia of cervix, low grade (CIN 1)    in 02 s/p LEEP   . GERD (gastroesophageal reflux disease)    food related  . History of cancer chemotherapy    last in August 2014  . 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 History of radiation therapy 06/23/2013-08/08/2013   62.4 gray to left breast  . HTN (hypertension)   . Hypercholesteremia   . Insomnia   . Left kidney mass   . LV dysfunction    a. EF 45-50% at time of STEMI 08/2012. b. Echo 04/2013: EF 45-50%, mild focal basal hypertrophy of septum, grade 1 d/d.  Marland Kitchen Myocardial infarction ([place]) 09/07/12  . Peripheral vision loss    left  . Perirectal abscess    11/2018   . Pleural effusion, bilateral   . Pneumonia   . Pneumonia   . Renal artery stenosis ([facility])    a. 20% L RAS in 08/2012 by cath. b. Duplex 07/2012: >60% L RAS.  . Stroke, embolic ([place])    a. 62/5638 post cath. with residual reduced vision in left eye; noted right occiptal lobe/PCA infarct   . UTI (urinary tract infection)   . UTI (urinary tract infection)   . Vasovagal syncope    a. 04/2013 - echo stable compared to prior EF 45-50%, negative carotid duplex.    Past Surgical History:  Procedure Laterality Date  . BACK SURGERY     x3, lower back  . BREAST BIOPSY Left 12/03/2012   U/S Core- Malignant  . BREAST LUMPECTOMY Left 01/08/2013  . BREAST LUMPECTOMY WITH NEEDLE  LOCALIZATION AND AXILLARY SENTINEL LYMPH NODE BX Left 01/08/2013   Procedure: LEFT BREAST WIRE GUIDED  LUMPECTOMY AND LEFT AXILLARY SENTINEL  NODE BX;  Surgeon: Rolm Bookbinder, MD;  Location: [facility];  Service: General;  Laterality: Left;  . CATARACT EXTRACTION Bilateral   . CERVICAL BIOPSY  W/ LOOP ELECTRODE EXCISION     02/2001  . CORONARY ANGIOPLASTY WITH STENT PLACEMENT    . EYE SURGERY     b/l cataract   . INCISION AND DRAINAGE PERIRECTAL ABSCESS N/A 12/11/2018   Procedure: IRRIGATION AND DEBRIDEMENT PERIRECTAL ABSCESS;  Surgeon: Johnathan Hausen, MD;  Location: [facility];  Service: General;  Laterality:  N/A;  . LEFT HEART CATHETERIZATION WITH CORONARY ANGIOGRAM N/A 09/07/2012   Procedure: LEFT HEART CATHETERIZATION WITH CORONARY ANGIOGRAM;  Surgeon: Troy Sine, MD;  Location: [facility] CATH LAB;  Service: Cardiovascular;  Laterality: N/A;  . LEFT HEART CATHETERIZATION WITH CORONARY ANGIOGRAM  09/09/2012   Procedure: LEFT HEART CATHETERIZATION WITH CORONARY ANGIOGRAM;  Surgeon: Leonie Man, MD;  Location: [facility] CATH LAB;  Service: Cardiovascular;;  . PERCUTANEOUS CORONARY STENT INTERVENTION (PCI-S) N/A 09/07/2012   Procedure: PERCUTANEOUS CORONARY STENT INTERVENTION (PCI-S);  Surgeon: Troy Sine, MD;  Location: [facility] CATH LAB;  Service: Cardiovascular;  Laterality: N/A;  . PORT-A-CATH REMOVAL N/A 10/14/2013   Procedure: REMOVAL PORT-A-CATH;  Surgeon: Rolm Bookbinder, MD;  Location: [facility];  Service: General;  Laterality: N/A;  . PORTACATH PLACEMENT Right 02/17/2013   Procedure: INSERTION PORT-A-CATH;  Surgeon: Rolm Bookbinder, MD;  Location: [place];  Service: General;  Laterality: Right;  . THORACENTESIS     12/13/2018 atypical cells not definitely diag. of malignancy     Prior to Admission medications   Medication Sig Start Date End Date Taking? Authorizing Provider  aspirin 81 MG EC tablet Take 1 tablet (81 mg total) by mouth daily. 04/16/15  Yes Barton Dubois, MD  atorvastatin (LIPITOR) 80  MG tablet TAKE 1 TABLET BY MOUTH DAILY. Patient taking differently: Take 80 mg by mouth daily.  07/15/19  Yes Fay Records, MD  carvedilol (COREG) 25 MG tablet TAKE 1 TABLET BY MOUTH TWICE A DAY WITH MEALS Patient taking differently: Take 25 mg by mouth 2 (two) times daily with a meal.  07/14/19  Yes Fay Records, MD  ezetimibe (ZETIA) 10 MG tablet TAKE 1 TABLET BY MOUTH EVERY DAY Patient taking differently: Take 10 mg by mouth at bedtime.  07/15/19  Yes Fay Records, MD  feeding supplement, ENSURE ENLIVE, (ENSURE ENLIVE) LIQD Take 237 mLs by mouth 2 (two) times daily as needed (If PO intakes of meals are poor). Patient taking differently: Take 237 mLs by mouth every other day as needed (if meal intake is poor).  12/14/18  Yes Irene Pap N, DO  furosemide (LASIX) 40 MG tablet Take 80 mg by mouth in the morning.   Yes [provider]  hydrALAZINE (APRESOLINE) 10 MG tablet Take 1 tablet (10 mg total) by mouth 2 (two) times daily as needed. For BP >130/>80 Patient taking differently: Take 10 mg by mouth 2 (two) times daily as needed (For BP >130/>80).  12/25/18  Yes McLean-Scocuzza, Nino Glow, MD  insulin NPH Human (NOVOLIN N RELION) 100 UNIT/ML injection Inject 0.15 mLs (15 Units total) into the skin 2 (two) times daily before a meal. 11/04/19  Yes Philemon Kingdom, MD  insulin regular (NOVOLIN R RELION) 100 units/mL injection Inject 0.1-0.15 mLs (10-15 Units total) into the skin 3 (three) times daily before meals. 11/04/19  Yes Philemon Kingdom, MD  isosorbide mononitrate (IMDUR) 60 MG 24 hr tablet TAKE 1 TABLET BY MOUTH DAILY. Patient taking differently: Take 60 mg by mouth daily.  07/14/19  Yes Fay Records, MD  lisinopril (ZESTRIL) 40 MG tablet Take 1 tablet (40 mg total) by mouth daily. 02/16/20  Yes Jennye Boroughs, MD  metolazone (ZAROXOLYN) 2.5 MG tablet Take 1 tablet (2.5 mg total) by mouth daily. 02/04/20 05/04/20 Yes Fay Records, MD  nitroGLYCERIN (NITROSTAT) 0.4 MG SL tablet Place 1  tablet (0.4 mg total) under the tongue every 5 (five) minutes as needed for chest pain. 04/26/18  Yes Fay Records,  MD  spironolactone (ALDACTONE) 25 MG tablet TAKE 1 TABLET BY MOUTH EVERY DAY Patient taking differently: Take 25 mg by mouth daily.  01/19/20  Yes Fay Records, MD  furosemide (LASIX) 80 MG tablet Take 0.5-1 tablets (40-80 mg total) by mouth See admin instructions. Take 1 tablet daily by mouth. Every third day take 2 tablets. 02/16/20   Jennye Boroughs, MD  glucose blood [facility] VERIO) test strip Use TID 10/03/12   Janith Lima, MD  potassium chloride (KLOR-CON) 10 MEQ tablet Take 1 tablet (10 mEq total) by mouth daily. Patient not taking: Reported on 02/19/2020 02/04/20   Fay Records, MD    Scheduled Meds: . heparin  5,000 Units Subcutaneous Q8H  . insulin aspart  0-15 Units Subcutaneous TID WC  . insulin aspart  0-5 Units Subcutaneous QHS  . insulin aspart  5 Units Subcutaneous TID WC  . insulin glargine  15 Units Subcutaneous Daily  . sodium chloride flush  3 mL Intravenous Q12H   Infusions:  PRN Meds: acetaminophen **OR** acetaminophen, hydrALAZINE   Allergies as of 02/19/2020  . (No Known Allergies)    Family History  Problem Relation Age of Onset  . COPD Mother   . Heart disease Father   . Hypertension Father   . Diabetes Brother   . Hypertension Brother   . Learning disabilities Brother   . Hypertension Brother   . Asthma Daughter   . Cancer Neg Hx   . Hyperlipidemia Neg Hx   . Kidney disease Neg Hx   . Stroke Neg Hx     Social History   Socioeconomic History  . Marital status: Married    Spouse name: [name]  . Number of children: 2  . Years of education: college  . Highest education level: Not on file  Occupational History    Employer: [employer]  Tobacco Use  . Smoking status: Never Smoker  . Smokeless tobacco: Never Used  Substance and Sexual Activity  . Alcohol use: Not Currently  . Drug use: No  . Sexual activity: Yes   Other Topics Concern  . Not on file  Social History Narrative   Married   Retired    Former Optometrist    Patient has 2 children and some college education.    Owns guns, wears seat belt, safe in relationship    1 cat   Right-handed.   Caffeine use: 2 liters of soda and 2-3 cups coffee per day.   Social Determinants of Health   Financial Resource Strain:   . Difficulty of Paying Living Expenses:   Food Insecurity:   . Worried About Charity fundraiser in the Last Year:   . Arboriculturist in the Last Year:   Transportation Needs:   . Film/video editor (Medical):   Marland Kitchen Lack of Transportation (Non-Medical):   Physical Activity:   . Days of Exercise per Week:   . Minutes of Exercise per Session:   Stress:   . Feeling of Stress :   Social Connections:   . Frequency of Communication with Friends and Family:   . Frequency of Social Gatherings with Friends and Family:   . Attends Religious Services:   . Active Member of Clubs or Organizations:   . Attends Archivist Meetings:   Marland Kitchen Marital Status:   Intimate Partner Violence:   . Fear of Current or Ex-Partner:   . Emotionally Abused:   Marland Kitchen Physically Abused:   .  Sexually Abused:     REVIEW OF SYSTEMS: Constitutional: Weakness, fatigue, collapse without LOC earlier today. ENT:  No nose bleeds Pulm: No shortness of breath or cough CV:  No palpitations, no LE edema.  No angina GU:  No hematuria, no frequency, no blood in the urine. GI: See HPI. Heme: No unusual or excessive bleeding or bruising. Transfusions: None ever. Neuro:  No headaches, no peripheral tingling or numbness.  No seizures. Derm:  No itching, no rash or sores.  Endocrine:  Sugars hard to control.  No sweats or chills.  No polyuria or dysuria Immunization: Completed Moderna Covid vaccination. Travel:  None beyond local counties in last few months.    PHYSICAL EXAM: Vital signs in last 24 hours: Vitals:   02/19/20 1330 02/19/20 1415  BP:  (!) 143/89 (!) 141/89  Pulse: 65 65  Resp: 14 15  Temp:    SpO2: 100% 99%   Wt Readings from Last 3 Encounters:  02/19/20 44.9 kg  02/16/20 46.3 kg  01/20/20 62.6 kg    General: Somewhat frail, not acutely ill-appearing.  Alert, comfortable Head: No facial asymmetry or swelling.  No signs of head trauma. Eyes: No conjunctival pallor, no scleral icterus. Ears: Not hard of hearing Nose: No discharge or congestion Mouth: Good dentition.  Tongue midline.  Mucosa moist, pink, clear. Neck: No JVD, no masses, no thyromegaly. Lungs: Clear bilaterally.  No labored breathing or cough. Heart: RRR.  No MRG.  S1, S2 present. Abdomen: Nontender, nondistended soft.  No masses, HSM, bruits, hernias..   Rectal: Not performed today Musc/Skeltl: No joint redness, swelling or gross deformity. Extremities: No CCE. Neurologic: Oriented x3.  Speech is a little bit halting but accurate.  Not confused.  Essential tremor in her head.  Moves all 4 limbs, strength not tested.  No limb tremor. Skin: No rash, no sores, no significant purpura or bruising.  No telangiectasia. Nodes: No cervical adenopathy. Psych: Calm, cooperative, pleasant.  Intake/Output from previous day: No intake/output data recorded. Intake/Output this shift: Total I/O In: 1000 [IV Piggyback:1000] Out: -   LAB RESULTS: Recent Labs    02/19/20 1100  WBC 5.7  HGB 15.2*  HCT 45.6  PLT 257   BMET Lab Results  Component Value Date   NA 133 (L) 02/19/2020   NA 132 (L) 02/16/2020   NA 132 (L) 02/15/2020   K 3.2 (L) 02/19/2020   K 3.8 02/16/2020   K 3.4 (L) 02/15/2020   CL 89 (L) 02/19/2020   CL 92 (L) 02/16/2020   CL 92 (L) 02/15/2020   CO2 30 02/19/2020   CO2 29 02/16/2020   CO2 29 02/15/2020   GLUCOSE 415 (H) 02/19/2020   GLUCOSE 140 (H) 02/16/2020   GLUCOSE 220 (H) 02/15/2020   BUN 50 (H) 02/19/2020   BUN 52 (H) 02/16/2020   BUN 38 (H) 02/15/2020   CREATININE 1.17 (H) 02/19/2020   CREATININE 0.93 02/16/2020    CREATININE 0.68 02/15/2020   CALCIUM 9.5 02/19/2020   CALCIUM 9.6 02/16/2020   CALCIUM 9.6 02/15/2020   LFT No results for input(s): PROT, ALBUMIN, AST, ALT, ALKPHOS, BILITOT, BILIDIR, IBILI in the last 72 hours. PT/INR Lab Results  Component Value Date   INR 1.66 (H) 04/11/2015   INR 0.98 02/10/2013   INR 1.01 01/06/2013   Hepatitis Panel No results for input(s): HEPBSAG, HCVAB, HEPAIGM, HEPBIGM in the last 72 hours. C-Diff No components found for: CDIFF Lipase     Component Value Date/Time   LIPASE  40 02/14/2020 1103    Drugs of Abuse     Component Value Date/Time   LABOPIA NONE DETECTED 04/11/2015 1807   COCAINSCRNUR NONE DETECTED 04/11/2015 1807   LABBENZ NONE DETECTED 04/11/2015 1807   AMPHETMU NONE DETECTED 04/11/2015 1807   THCU NONE DETECTED 04/11/2015 1807   LABBARB NONE DETECTED 04/11/2015 1807     RADIOLOGY STUDIES: No results found.    IMPRESSION:   *     Colitis with concern for underlying neoplasm at level of splenic flexure.  *    Collapse earlier today.  Elevated glucose, hypotensive per EMS Just discharged from hospital elevated BP, hypertensive encephalopathy, elevated glucose.  *   Hypokalemia.  Hyponatremia but overall improved from recent admission a few days ago.  AKI.  *    CHF, seems well-compensated currently.      PLAN:     *   Will need colonoscopy for evaluation of possible colitis and to rule out colonic neoplasia. Since her blood pressure has improved and patient feels that she could complete a bowel prep with movie prep, I am going to set her up for colonoscopy tomorrow  *   Note I mentioned the colitis to pt/husband but not the splenic area bowel transition and need to rule out cancer.     Azucena Freed  02/19/2020, 2:46 PM Phone [phone]   Attending physician's note   I have taken an interval history, reviewed the chart and examined the patient. I agree with the Advanced Practitioner's note, impression and  recommendations.   Abn CT 4/17 showing ?  Neoplasm at splenic flexure vs focal colitis.  Some thickening of the cecum, transverse colon and rectum.Marland Kitchen No LGI symptoms/wt loss. H/O N/V/epi pain (resolved) Associated CHF EF 25-30%, CAD, CVA, HTN, L Br Ca s/p lumpectomy/XRT/Chemo 2014 Uncontrolled IDDM with hemoglobin A1c 15.0.  Can have gastroparesis as well.  Plan: -Proceed with EGD/colonoscopy in AM.  She thinks she can handle the preparation.  I have discussed risks and benefits. -Monitor and correct electrolytes -CT reviewed independently.   Carmell Austria, MD Velora Heckler Fabienne Bruns [phone].

## 2020-02-19 NOTE — Telephone Encounter (Signed)
Faxed ROC for SN to [name] [phone]  Faxed completed 02/19/20 Verbal date [id number] @ [id number] Cert date [id number]

## 2020-02-19 NOTE — Plan of Care (Signed)
  Problem: Education: Goal: Knowledge of General Education information will improve Description Including pain rating scale, medication(s)/side effects and non-pharmacologic comfort measures Outcome: Progressing   Problem: Health Behavior/Discharge Planning: Goal: Ability to manage health-related needs will improve Outcome: Progressing   

## 2020-02-20 ENCOUNTER — Telehealth: Payer: Self-pay | Admitting: Internal Medicine

## 2020-02-20 ENCOUNTER — Encounter (HOSPITAL_COMMUNITY): Payer: Self-pay | Admitting: Internal Medicine

## 2020-02-20 ENCOUNTER — Encounter (HOSPITAL_COMMUNITY)
Admission: EM | Disposition: A | Discharge status: Home / Self Care | Payer: Self-pay | Source: Home / Self Care | Attending: Emergency Medicine

## 2020-02-20 ENCOUNTER — Observation Stay (HOSPITAL_COMMUNITY): Payer: Medicare Other | Admitting: Certified Registered Nurse Anesthetist

## 2020-02-20 DIAGNOSIS — K5649 Other impaction of intestine: Secondary | ICD-10-CM | POA: Diagnosis not present

## 2020-02-20 DIAGNOSIS — K2970 Gastritis, unspecified, without bleeding: Secondary | ICD-10-CM

## 2020-02-20 DIAGNOSIS — E43 Unspecified severe protein-calorie malnutrition: Secondary | ICD-10-CM | POA: Diagnosis not present

## 2020-02-20 DIAGNOSIS — M479 Spondylosis, unspecified: Secondary | ICD-10-CM | POA: Diagnosis not present

## 2020-02-20 DIAGNOSIS — J90 Pleural effusion, not elsewhere classified: Secondary | ICD-10-CM | POA: Diagnosis not present

## 2020-02-20 DIAGNOSIS — K5730 Diverticulosis of large intestine without perforation or abscess without bleeding: Secondary | ICD-10-CM | POA: Diagnosis not present

## 2020-02-20 DIAGNOSIS — I2510 Atherosclerotic heart disease of native coronary artery without angina pectoris: Secondary | ICD-10-CM | POA: Diagnosis not present

## 2020-02-20 DIAGNOSIS — E861 Hypovolemia: Secondary | ICD-10-CM

## 2020-02-20 DIAGNOSIS — Z681 Body mass index (BMI) 19 or less, adult: Secondary | ICD-10-CM | POA: Diagnosis not present

## 2020-02-20 DIAGNOSIS — K2100 Gastro-esophageal reflux disease with esophagitis, without bleeding: Secondary | ICD-10-CM

## 2020-02-20 DIAGNOSIS — K2950 Unspecified chronic gastritis without bleeding: Secondary | ICD-10-CM | POA: Diagnosis not present

## 2020-02-20 DIAGNOSIS — Z66 Do not resuscitate: Secondary | ICD-10-CM | POA: Diagnosis not present

## 2020-02-20 DIAGNOSIS — I5042 Chronic combined systolic (congestive) and diastolic (congestive) heart failure: Secondary | ICD-10-CM | POA: Diagnosis not present

## 2020-02-20 DIAGNOSIS — I9589 Other hypotension: Secondary | ICD-10-CM

## 2020-02-20 DIAGNOSIS — Z20822 Contact with and (suspected) exposure to covid-19: Secondary | ICD-10-CM | POA: Diagnosis not present

## 2020-02-20 DIAGNOSIS — R55 Syncope and collapse: Secondary | ICD-10-CM | POA: Diagnosis not present

## 2020-02-20 DIAGNOSIS — N390 Urinary tract infection, site not specified: Secondary | ICD-10-CM | POA: Diagnosis not present

## 2020-02-20 DIAGNOSIS — K529 Noninfective gastroenteritis and colitis, unspecified: Secondary | ICD-10-CM | POA: Diagnosis not present

## 2020-02-20 DIAGNOSIS — N179 Acute kidney failure, unspecified: Secondary | ICD-10-CM | POA: Diagnosis not present

## 2020-02-20 DIAGNOSIS — K449 Diaphragmatic hernia without obstruction or gangrene: Secondary | ICD-10-CM | POA: Diagnosis not present

## 2020-02-20 DIAGNOSIS — I110 Hypertensive heart disease with heart failure: Secondary | ICD-10-CM | POA: Diagnosis not present

## 2020-02-20 DIAGNOSIS — K209 Esophagitis, unspecified without bleeding: Secondary | ICD-10-CM | POA: Diagnosis not present

## 2020-02-20 HISTORY — PX: BIOPSY: SHX5522

## 2020-02-20 HISTORY — PX: IMPACTION REMOVAL: SHX5858

## 2020-02-20 HISTORY — PX: ESOPHAGOGASTRODUODENOSCOPY (EGD) WITH PROPOFOL: SHX5813

## 2020-02-20 LAB — CBC WITH DIFFERENTIAL/PLATELET
Abs Immature Granulocytes: 0.04 10*3/uL (ref 0.00–0.07)
Basophils Absolute: 0 10*3/uL (ref 0.0–0.1)
Basophils Relative: 0 %
Eosinophils Absolute: 0.1 10*3/uL (ref 0.0–0.5)
Eosinophils Relative: 1 %
HCT: 47.6 % — ABNORMAL HIGH (ref 36.0–46.0)
Hemoglobin: 16.3 g/dL — ABNORMAL HIGH (ref 12.0–15.0)
Immature Granulocytes: 0 %
Lymphocytes Relative: 19 %
Lymphs Abs: 1.7 10*3/uL (ref 0.7–4.0)
MCH: 29.5 pg (ref 26.0–34.0)
MCHC: 34.2 g/dL (ref 30.0–36.0)
MCV: 86.1 fL (ref 80.0–100.0)
Monocytes Absolute: 0.8 10*3/uL (ref 0.1–1.0)
Monocytes Relative: 9 %
Neutro Abs: 6.4 10*3/uL (ref 1.7–7.7)
Neutrophils Relative %: 71 %
Platelets: 259 10*3/uL (ref 150–400)
RBC: 5.53 MIL/uL — ABNORMAL HIGH (ref 3.87–5.11)
RDW: 13.2 % (ref 11.5–15.5)
WBC: 9.1 10*3/uL (ref 4.0–10.5)
nRBC: 0 % (ref 0.0–0.2)

## 2020-02-20 LAB — BASIC METABOLIC PANEL
Anion gap: 13 (ref 5–15)
BUN: 45 mg/dL — ABNORMAL HIGH (ref 8–23)
CO2: 28 mmol/L (ref 22–32)
Calcium: 9.6 mg/dL (ref 8.9–10.3)
Chloride: 95 mmol/L — ABNORMAL LOW (ref 98–111)
Creatinine, Ser: 0.84 mg/dL (ref 0.44–1.00)
GFR calc Af Amer: >60 mL/min (ref >60)
GFR calc non Af Amer: >60 mL/min (ref >60)
Glucose, Bld: 91 mg/dL (ref 70–99)
Potassium: 3 mmol/L — ABNORMAL LOW (ref 3.5–5.1)
Sodium: 136 mmol/L (ref 135–145)

## 2020-02-20 LAB — GLUCOSE, CAPILLARY
Glucose-Capillary: 121 mg/dL — ABNORMAL HIGH (ref 70–99)
Glucose-Capillary: 219 mg/dL — ABNORMAL HIGH (ref 70–99)

## 2020-02-20 SURGERY — ESOPHAGOGASTRODUODENOSCOPY (EGD) WITH PROPOFOL
Anesthesia: Monitor Anesthesia Care

## 2020-02-20 SURGERY — ESOPHAGOGASTRODUODENOSCOPY (EGD) WITH PROPOFOL
Anesthesia: General

## 2020-02-20 MED ORDER — SODIUM CHLORIDE 0.9 % IV SOLN: INTRAVENOUS | Status: DC

## 2020-02-20 MED ORDER — EPHEDRINE SULFATE-NACL 50-0.9 MG/10ML-% IV SOSY
PREFILLED_SYRINGE | INTRAVENOUS | Status: DC | PRN
  Administered 2020-02-20: 15 mg via INTRAVENOUS

## 2020-02-20 MED ORDER — POLYETHYLENE GLYCOL 3350 17 G PO PACK: 17.0000 g | PACK | Freq: Two times a day (BID) | ORAL | 0 refills | Status: AC

## 2020-02-20 MED ORDER — POLYETHYLENE GLYCOL 3350 17 G PO PACK
17.0000 g | PACK | Freq: Every day | ORAL | Status: DC
  Administered 2020-02-20: 11:00:00 17 g via ORAL
  Filled 2020-02-20: qty 1

## 2020-02-20 MED ORDER — DEXAMETHASONE SODIUM PHOSPHATE 10 MG/ML IJ SOLN
INTRAMUSCULAR | Status: DC | PRN
  Administered 2020-02-20: 5 mg via INTRAVENOUS

## 2020-02-20 MED ORDER — PROPOFOL 10 MG/ML IV BOLUS
INTRAVENOUS | Status: DC | PRN
  Administered 2020-02-20 (×2): 10 mg via INTRAVENOUS

## 2020-02-20 MED ORDER — ONDANSETRON HCL 4 MG/2ML IJ SOLN
INTRAMUSCULAR | Status: DC | PRN
  Administered 2020-02-20: 4 mg via INTRAVENOUS

## 2020-02-20 MED ORDER — PANTOPRAZOLE SODIUM 40 MG PO TBEC: 40.0000 mg | DELAYED_RELEASE_TABLET | Freq: Two times a day (BID) | ORAL | 0 refills | Status: AC

## 2020-02-20 MED ORDER — POTASSIUM CHLORIDE CRYS ER 20 MEQ PO TBCR
40.0000 meq | EXTENDED_RELEASE_TABLET | ORAL | Status: AC
  Administered 2020-02-20 (×2): 40 meq via ORAL
  Filled 2020-02-20 (×2): qty 2

## 2020-02-20 MED ORDER — LACTATED RINGERS IV SOLN: INTRAVENOUS | Status: DC

## 2020-02-20 MED ORDER — PROPOFOL 500 MG/50ML IV EMUL
INTRAVENOUS | Status: DC | PRN
  Administered 2020-02-20: 80 ug/kg/min via INTRAVENOUS

## 2020-02-20 MED ORDER — PANTOPRAZOLE SODIUM 40 MG PO TBEC
40.0000 mg | DELAYED_RELEASE_TABLET | Freq: Two times a day (BID) | ORAL | Status: DC
  Administered 2020-02-20: 40 mg via ORAL
  Filled 2020-02-20: qty 1

## 2020-02-20 SURGICAL SUPPLY — 25 items

## 2020-02-20 NOTE — Anesthesia Procedure Notes (Signed)
Procedure Name: MAC Date/Time: 02/20/2020 9:32 AM Performed by: Janace Litten, CRNA Pre-anesthesia Checklist: Patient identified, Emergency Drugs available and Suction available Patient Re-evaluated:Patient Re-evaluated prior to induction Oxygen Delivery Method: Nasal cannula

## 2020-02-20 NOTE — Telephone Encounter (Signed)
Faxed DC from [facility] with PT/OT [facility] orders to [name]  PT 02/15/20  1 week 1 OT 02/22/20 1 weeks 1

## 2020-02-20 NOTE — Evaluation (Signed)
Physical Therapy Evaluation Patient Details Name: Marisa Forbes MRN: [id number] DOB: Jun 26, 1951 Today's Date: 02/20/2020   History of Present Illness  Pt is a 69 y/o female admitted secondary to hypotension and syncope. Pt is s/p EGD and colonoscopy where she was found to have fecal impaction and is s/p removal. PMH includes CHF, CAD, STEMI, DM, HTN, and breast cancer.   Clinical Impression  [name] Pt reports feeling much better since procedure this morning. Presenting with weakness in BLE and decreased balance. REquired min to min guard A for mobility using RW. Pt reports husband is available to assist at home. Will continue to follow acutely to maximize functional mobility independence and safety.     Follow Up Recommendations Home health PT;Supervision for mobility/OOB    Equipment Recommendations  None recommended by PT    Recommendations for Other Services       Precautions / Restrictions Precautions Precautions: Fall Restrictions Weight Bearing Restrictions: No      Mobility  Bed Mobility Overal bed mobility: Needs Assistance Bed Mobility: Supine to Sit;Sit to Supine     Supine to sit: Min assist Sit to supine: Min assist   General bed mobility comments: Min A for trunk and LE assist throughout bed mobility  Transfers Overall transfer level: Needs assistance Equipment used: Rolling walker (2 wheeled) Transfers: Sit to/from Stand Sit to Stand: Min assist         General transfer comment: Min A for lift assist and steadying.   Ambulation/Gait Ambulation/Gait assistance: Min guard Gait Distance (Feet): 75 Feet Assistive device: Rolling walker (2 wheeled) Gait Pattern/deviations: Step-through pattern;Decreased stride length Gait velocity: Decreased   General Gait Details: Very slow, cautious gait. Short step length when taking steps. Educated about using RW at home to increase safety with mobility.    Stairs            Wheelchair Mobility    Modified Rankin (Stroke Patients Only)       Balance Overall balance assessment: Needs assistance Sitting-balance support: No upper extremity supported;Feet supported Sitting balance-Leahy Scale: Fair     Standing balance support: Bilateral upper extremity supported;During functional activity Standing balance-Leahy Scale: Poor Standing balance comment: Reliant on BUE support                              Pertinent Vitals/Pain Pain Assessment: No/denies pain    Home Living Family/patient expects to be discharged to:: Private residence Living Arrangements: Spouse/significant other Available Help at Discharge: Family;Available 24 hours/day Type of Home: House Home Access: Stairs to enter Entrance Stairs-Rails: Right Entrance Stairs-Number of Steps: 3 Home Layout: One level Home Equipment: Bedside commode;Shower seat;Cane - single point;Wheelchair - [employer] - 2 wheels;Walker - 4 wheels;Other (comment)(lift chair)      Prior Function Level of Independence: Needs assistance   Gait / Transfers Assistance Needed: Use cane   ADL's / Homemaking Assistance Needed: Help with transfers and husband helps with bathing.         Hand Dominance        Extremity/Trunk Assessment   Upper Extremity Assessment Upper Extremity Assessment: Generalized weakness    Lower Extremity Assessment Lower Extremity Assessment: Generalized weakness    Cervical / Trunk Assessment Cervical / Trunk Assessment: Kyphotic  Communication   Communication: No difficulties  Cognition Arousal/Alertness: Awake/alert Behavior During Therapy: WFL for tasks assessed/performed Overall Cognitive Status: Within Functional Limits for tasks assessed  General Comments General comments (skin integrity, edema, etc.): Pt's daughter present during session     Exercises      Assessment/Plan    PT Assessment Patient needs continued PT services  PT Problem List Decreased strength;Decreased balance;Decreased activity tolerance;Decreased mobility;Decreased knowledge of use of DME       PT Treatment Interventions DME instruction;Gait training;Stair training;Functional mobility training;Therapeutic activities;Therapeutic exercise;Balance training;Patient/family education    PT Goals (Current goals can be found in the Care Plan section)  Acute Rehab PT Goals Patient Stated Goal: to go home PT Goal Formulation: With patient Time For Goal Achievement: 03/05/20 Potential to Achieve Goals: Good    Frequency Min 3X/week   Barriers to discharge        Co-evaluation               AM-PAC PT "6 Clicks" Mobility  Outcome Measure Help needed turning from your back to your side while in a flat bed without using bedrails?: A Little Help needed moving from lying on your back to sitting on the side of a flat bed without using bedrails?: A Little Help needed moving to and from a bed to a chair (including a wheelchair)?: A Little Help needed standing up from a chair using your arms (e.g., wheelchair or bedside chair)?: A Little Help needed to walk in hospital room?: A Little Help needed climbing 3-5 steps with a railing? : A Lot 6 Click Score: 17    End of Session Equipment Utilized During Treatment: Gait belt Activity Tolerance: Patient tolerated treatment well Patient left: in bed;with call bell/phone within reach;with bed alarm set Nurse Communication: Mobility status PT Visit Diagnosis: Unsteadiness on feet (R26.81);Muscle weakness (generalized) (M62.81)    Time: [id number] PT Time Calculation (min) (ACUTE ONLY): 24 min   Charges:   PT Evaluation $PT Eval Moderate Complexity: 1 Mod PT Treatments $Gait Training: 8-22 mins        Lou Miner, DPT  Acute Rehabilitation Services  Pager: [phone] Office: [phone]   Rudean Hitt 02/20/2020, 2:26 PM

## 2020-02-20 NOTE — Anesthesia Postprocedure Evaluation (Signed)
Anesthesia Post Note  Patient: Marisa Forbes  Procedure(s) Performed: ESOPHAGOGASTRODUODENOSCOPY (EGD) WITH PROPOFOL (N/A ) BIOPSY IMPACTION REMOVAL CANCELLED PROCEDURE     Patient location during evaluation: Endoscopy Anesthesia Type: MAC Level of consciousness: awake Pain management: pain level controlled Vital Signs Assessment: post-procedure vital signs reviewed and stable Respiratory status: spontaneous breathing Cardiovascular status: stable Postop Assessment: no apparent nausea or vomiting Anesthetic complications: no    Last Vitals:  Vitals:   02/20/20 1015 02/20/20 1020  BP: (!) 188/89 (!) 186/84  Pulse: 62 66  Resp: 10 16  Temp:  36.6 C  SpO2: 98% 98%    Last Pain:  Vitals:   02/20/20 1020  TempSrc: Oral  PainSc: 0-No pain                 Blade Scheff

## 2020-02-20 NOTE — Op Note (Signed)
[facility] Patient Name: Marisa Forbes Procedure Date : 02/20/2020 MRN: [id number] Attending MD: Jackquline Denmark , MD Date of Birth: Sep 12, 1951 CSN: [id number] Age: 69 Admit Type: Inpatient Procedure:                Upper GI endoscopy Indications:              Nausea/vomiting Providers:                Jackquline Denmark, MD, Josie Dixon, RN, Elspeth Cho Tech., Technician, Mora Appl, CRNA Referring MD:              Medicines:                Monitored Anesthesia Care Complications:            No immediate complications. Estimated Blood Loss:     Estimated blood loss: none. Procedure:                Pre-Anesthesia Assessment:                           - Prior to the procedure, a History and Physical                            was performed, and patient medications and                            allergies were reviewed. The patient's tolerance of                            previous anesthesia was also reviewed. The risks                            and benefits of the procedure and the sedation                            options and risks were discussed with the patient.                            All questions were answered, and informed consent                            was obtained. Prior Anticoagulants: The patient has                            taken no previous anticoagulant or antiplatelet                            agents. ASA Grade Assessment: III - A patient with                            severe systemic disease. After reviewing the risks  and benefits, the patient was deemed in                            satisfactory condition to undergo the procedure.                           After obtaining informed consent, the endoscope was                            passed under direct vision. Throughout the                            procedure, the patient's blood pressure, pulse, and                            oxygen  saturations were monitored continuously. The                            [id number] Olympus gastroscope was                            introduced through the mouth, and advanced to the                            second part of duodenum. The upper GI endoscopy was                            accomplished without difficulty. The patient                            tolerated the procedure well. Scope In: Scope Out: Findings:      LA Grade B (one or more mucosal breaks greater than 5 mm, not extending       between the tops of two mucosal folds) esophagitis with no bleeding was       found 34 to 36 cm from the incisors. Biopsies were taken with a cold       forceps for histology.      A small hiatal hernia was present.      Localized mild inflammation characterized by erythema was found in the       gastric antrum. Biopsies were taken with a cold forceps for histology.      The examined duodenum was normal. Biopsies for histology were taken with       a cold forceps for evaluation of celiac disease. Impression:               - LA Grade B reflux esophagitis with no bleeding.                            Biopsied.                           - Small hiatal hernia.                           - Gastritis. Biopsied.                           -  Normal examined duodenum. Biopsied. Recommendation:           - Patient has a contact number available for                            emergencies. The signs and symptoms of potential                            delayed complications were discussed with the                            patient. Return to normal activities tomorrow.                            Written discharge instructions were provided to the                            patient.                           - Resume previous diet.                           - Use Protonix (pantoprazole) 40 mg PO BID x 8                            weeks, then once a day.                           - Await pathology  results.                           - If still with upper GI problems, consider                            solid-phase gastric emptying scan.                           - FU in GI clinic in 3 to 4 weeks. Procedure Code(s):        --- Professional ---                           [phone], Esophagogastroduodenoscopy, flexible,                            transoral; with biopsy, single or multiple Diagnosis Code(s):        --- Professional ---                           K21.00, Gastro-esophageal reflux disease with                            esophagitis, without bleeding                           K44.9, Diaphragmatic hernia without obstruction or  gangrene                           K29.70, Gastritis, unspecified, without bleeding                           R10.13, Epigastric pain CPT copyright 2019 [employer]. All rights reserved. The codes documented in this report are preliminary and upon coder review may  be revised to meet current compliance requirements. Jackquline Denmark, MD 02/20/2020 10:13:35 AM This report has been signed electronically. Number of Addenda: 0

## 2020-02-20 NOTE — Discharge Summary (Signed)
Physician Discharge Summary  Marisa Forbes [id number] DOB: 14-Jul-1951 DOA: 02/19/2020  PCP: McLean-Scocuzza, Nino Glow, MD  Admit date: 02/19/2020 Discharge date: 02/20/2020  Time spent: 40 minutes  Recommendations for Outpatient Follow-up:  1. Follow outpatient CBC/CMP 2. Improved BP's  3. Follow volume status outpatient - given orthostasis on presentation, recommend d/c metolazone and will hold lasix for 3 days (instructed to follow weights over next 3 days - if weight up 2-3 lbs in 1 day or 5 lbs in 1 week to resume lasix and call PCP/cards) 4. Follow GI path outpatient 5. Continue PPI BID x 8 weeks 6. Consider gastric emptying study if continued abdominal symptoms  Discharge Diagnoses:  Active Problems:   Hypotension   Near syncope   Impaction of colon [facility])   Discharge Condition: stable  Diet recommendation: heart healthy  Filed Weights   02/19/20 1027 02/20/20 0504  Weight: 44.9 kg 46.7 kg    History of present illness:  Marisa Forbes is Marisa Forbes 69 y.o. 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  Patient has now been eating and drinking well since last week, she went to [facility] on Saturday for evaluation of intractable abdominal pain and vomiting.   Work-up was done, CT abdomen shows questionable colon wall thickening, no significant she was treated with IV antiemetics for suspected acute gastroenteritis following unremarkable blood work.   She was observed overnight and discharged home.  However her symptoms of abdominal pain and intractable vomiting continued.  She describes abdominal pain as epigastrically located, aching like, constant around-the-clock.  She sometimes took Tums with little help.  Denies any diarrhea.  The vomitus usually contains stomach content no blood or bile.  For the last  few days she was not able to put much food or even water down, despite she continue to take her BP meds.  This morning she was on her way out and then collapsed, she denied any feeling of lightheadedness or or palpitation before this happened, no loss of consciousness, her husband was able to hold her to the floor.  No injury.   ED Course: EMS reports that her initial blood pressure was 60/40 on their arrival, with CBG 438. BP 90/67 on arrival, one liter bolus given and her BP improved to 140s/80s.  She was admitted with abdominal pina, nausea, vomiting.  She had an episode of presyncope with hypotension and was admitted for further evaluation.  She had EGD with gastroenterology with esophagitis and gastritis.  GI recommended PPI twice daily for 8 weeks, then daily.  Colonoscopy unable to be performed due to inadequate bowel prep/impaction.  Plan for outpatient evaluation.  She was feeling improved on 4/23 and was discharged with recommendations to follow outpatient.   Hospital Course:  Near syncope  Orthostatic Hypotension She has orthostasis in ER, exam showed she was dehydrated due to not able to eat and drink well for about 1 week.  Her BP improved after IV bolus, vital signs stable.  BP actually on the higher side after her procedure today.   Orthostatics positive, but asymptomatic now and improved Will resume home BP meds at discharge Discontinue metolazone - hold lasix for 3 days, then resume - follow weights closely  Intractable abdominal pain  Esophagitis  Gastritis  Impaction Endoscopy with esophagitis and gastritis PPI BID x 8 weeks and then daily  Follow bx outpatient  Consider gastric emptying study outpatient if continued symptoms Disimpacted, discharge with miralax daily   AKI Likely related to dehydration, recheck BMP in the morning  Hypokalemia Replace and recheck in the morning  Chronic combined CHF Clinically she is still mild hypokalemia, will not restart Lasix for  today, but will not keep her on continuous hydration as well as mentioned above.  IDDM Lantus 15 unit, NovoLog 5 units 3 times daily AC plus sliding scale  CAD No acute issue  Procedures: Colonoscopy - The procedure was aborted due to inadequate bowel prep. - Abnormal digital exam showing rectal impaction. Manually disimpacted. - No specimens collected. Impression: - Return patient to hospital ward for ongoing care. - MiraLAX 17 g p.o. once Marisa Forbes day. - Return to GI clinic in 2-3 weeks. Have recommended outpatient colonoscopy. - The findings and recommendations were discussed with the patient.  EGD Impression - LA Grade B reflux esophagitis with no bleeding. Biopsied. - Small hiatal hernia. - Gastritis. Biopsied. - Normal examined duodenum. Biopsied. Gastritis. Biopsied. - Normal examined duodenum. Biopsied. - Patient has Marisa Forbes contact number available for emergencies. The signs and symptoms of potential delayed complications were discussed with the patient. Return to normal activities tomorrow. Written discharge instructions were provided to the patient. - Resume previous diet. - Use Protonix (pantoprazole) 40 mg PO BID x 8 weeks, then once Marisa Forbes day. - Await pathology results. - If still with upper GI problems, consider solid-phase gastric emptying scan. - FU in GI clinic in 3 to 4 weeks.  Consultations:  GI  Discharge Exam: Vitals:   02/20/20 1015 02/20/20 1020  BP: (!) 188/89 (!) 186/84  Pulse: 62 66  Resp: 10 16  Temp:  97.9 F (36.6 C)  SpO2: 98% 98%   Feels better after procedure Daughter at bedside Looking forward to d/c home Discussed dc meds/recommendations and f/u  General: No acute distress. Cardiovascular: Heart sounds show Marisa Forbes regular rate, and rhythm. Lungs: Clear to auscultation bilaterally Abdomen: Soft, nontender, nondistended  Neurological: Alert and oriented 3. Moves all extremities 4. Cranial nerves II through XII grossly intact. Skin: Warm and  dry. No rashes or lesions. Extremities: No clubbing or cyanosis. No edema  Discharge Instructions   Discharge Instructions    Call MD for:  difficulty breathing, headache or visual disturbances   Complete by: As directed    Call MD for:  extreme fatigue   Complete by: As directed    Call MD for:  hives   Complete by: As directed    Call MD for:  persistant dizziness or light-headedness   Complete by: As directed    Call MD for:  persistant nausea and vomiting   Complete by: As directed    Call MD for:  redness, tenderness, or signs of infection (pain, swelling, redness, odor or green/yellow discharge around incision site)   Complete by: As directed    Call MD for:  severe uncontrolled pain   Complete by: As directed    Call MD for:  temperature >100.4   Complete by: As directed    Diet - low sodium heart healthy   Complete by: As directed    Discharge instructions   Complete by: As directed    You were seen for dehydration and near syncope (almost fainting) in the setting of abdominal pain and vomiting.    You had an impaction and EGD with evidence of esophagitis and gastritis.    You've been started on protonix twice daily  for 8 weeks, then you should take protonix once daily.  The GI doctors will follow up your biopsy results with you.  You should follow up with Dr. Lyndel Safe as an outpatient for your colonoscopy.  We'll discharge you home with miralax to take every day.    Your blood pressure was low when you got here, we'll hold your lasix for the next 3 days and discontinue your metolazone.  Restart your lasix after 3 days.  Please follow your weights closely every day.  If you gain 2-3 lbs in Brae Gartman day or 5 lbs in Laurelle Skiver week, please resume your lasix and call your PCP or cardiologist for follow up instructions.  If you continue to have abdominal symptoms, you may need Mazie Fencl gastric emptying study.  Please follow up Kyrian Stage chest x ray in Ozie Lupe week or two with your PCP.  Return for new,  recurrent, or worsening symptoms.  Please ask your PCP to request records from this hospitalization so they know what was done and what the next steps will be.   Increase activity slowly   Complete by: As directed      Allergies as of 02/20/2020   No Known Allergies     Medication List    STOP taking these medications   metolazone 2.5 MG tablet Commonly known as: ZAROXOLYN     TAKE these medications   aspirin 81 MG EC tablet Take 1 tablet (81 mg total) by mouth daily.   atorvastatin 80 MG tablet Commonly known as: LIPITOR TAKE 1 TABLET BY MOUTH DAILY.   carvedilol 25 MG tablet Commonly known as: COREG TAKE 1 TABLET BY MOUTH TWICE Jaana Brodt DAY WITH MEALS What changed:   how much to take  how to take this  when to take this  additional instructions   ezetimibe 10 MG tablet Commonly known as: ZETIA TAKE 1 TABLET BY MOUTH EVERY DAY What changed:   how much to take  how to take this  when to take this  additional instructions   feeding supplement (ENSURE ENLIVE) Liqd Take 237 mLs by mouth 2 (two) times daily as needed (If PO intakes of meals are poor). What changed:   when to take this  reasons to take this   furosemide 40 MG tablet Commonly known as: LASIX Take 80 mg by mouth in the morning. What changed: Another medication with the same name was removed. Continue taking this medication, and follow the directions you see here.   glucose blood test strip Commonly known as: OneTouch Verio Use TID   hydrALAZINE 10 MG tablet Commonly known as: APRESOLINE Take 1 tablet (10 mg total) by mouth 2 (two) times daily as needed. For BP >130/>80 What changed:   reasons to take this  additional instructions   insulin NPH Human 100 UNIT/ML injection Commonly known as: NovoLIN N ReliOn Inject 0.15 mLs (15 Units total) into the skin 2 (two) times daily before Jakya Dovidio meal.   insulin regular 100 units/mL injection Commonly known as: NovoLIN R ReliOn Inject 0.1-0.15 mLs  (10-15 Units total) into the skin 3 (three) times daily before meals.   isosorbide mononitrate 60 MG 24 hr tablet Commonly known as: IMDUR TAKE 1 TABLET BY MOUTH DAILY.   lisinopril 40 MG tablet Commonly known as: ZESTRIL Take 1 tablet (40 mg total) by mouth daily.   nitroGLYCERIN 0.4 MG SL tablet Commonly known as: NITROSTAT Place 1 tablet (0.4 mg total) under the tongue every 5 (five) minutes as needed for chest pain.  pantoprazole 40 MG tablet Commonly known as: PROTONIX Take 1 tablet (40 mg total) by mouth 2 (two) times daily. For 8 weeks, then transition to once daily   polyethylene glycol 17 g packet Commonly known as: MIRALAX / GLYCOLAX Take 17 g by mouth 2 (two) times daily for 14 days. Once you have [place] regular bowel movement daily, can decrease to once daily   potassium chloride 10 MEQ tablet Commonly known as: KLOR-CON Take 1 tablet (10 mEq total) by mouth daily.   spironolactone 25 MG tablet Commonly known as: ALDACTONE TAKE 1 TABLET BY MOUTH EVERY DAY      No Known Allergies    The results of significant diagnostics from this hospitalization (including imaging, microbiology, ancillary and laboratory) are listed below for reference.    Significant Diagnostic Studies: CT ABDOMEN PELVIS WO CONTRAST  Result Date: 02/14/2020 CLINICAL DATA:  Nausea and vomiting. EXAM: CT ABDOMEN AND PELVIS WITHOUT CONTRAST TECHNIQUE: Multidetector CT imaging of the abdomen and pelvis was performed following the standard protocol without IV contrast. COMPARISON:  Abdominal CT 12/11/2018, MRI 12/12/2018 FINDINGS: Lower chest: Cardiomegaly. Small left pleural effusion. There are coronary artery calcifications. Hepatobiliary: No focal hepatic lesion on noncontrast exam. Layering hyperdensity in the gallbladder may be sludge or stones. No pericholecystic inflammation. No biliary dilatation. Pancreas: No ductal dilatation or inflammation. Spleen: Normal in size without focal abnormality.  Adrenals/Urinary Tract: Adrenal thickening without dominant nodule. No hydronephrosis. No significant perinephric edema. Complex lesion in the lower pole left kidney measures 4.4 x 3.9 x 5.7 cm, not significantly changed from prior exam. This is previously characterized on MRI as hemorrhagic cyst. The urinary bladder is distended. No bladder wall thickening. Stomach/Bowel: Circumferential rectal wall thickening. Previous perirectal fluid collection no longer seen. No definite perirectal edema. Questionable wall thickening of the cecum. Formed stool in the ascending colon. Transverse colon is decompressed with equivocal wall thickening. Transition from decompressed transverse colon to stool-filled colon in the left abdomen, best appreciated on coronal series 5, image 26. Moderate stool in the distal transverse, descending, and sigmoid colon. Small bowel is decompressed and not well assessed. Fluid-filled stomach which is slightly prominent, but similar to prior exam. Appendix not confidently visualized. Vascular/Lymphatic: Aortic atherosclerosis. Peripheral branch atherosclerosis. No aortic aneurysm. No bulky abdominopelvic adenopathy. Reproductive: Uterus is slightly prominent but otherwise unremarkable. There periuterine vascular calcifications. No suspicious adnexal mass. Other: Generalized body wall edema. No definite ascites. No free air. Musculoskeletal: Bones are diffusely under mineralized. Scoliosis with multilevel degenerative change in the spine. There are no acute or suspicious osseous abnormalities. IMPRESSION: 1. Areas of possible colonic wall thickening involving the cecum, transverse colon, as well as rectum. Query segmental colitis. Transition from dilated to nondilated colon at the splenic flexure, recommend direct visualization with colonoscopy to exclude underlying colonic neoplasm. 2. Distended urinary bladder. 3. Layering hyperdensity in the gallbladder may be sludge or stones. No  pericholecystic inflammation. 4. Stable complex left lower pole renal lesion, previously characterized on MRI as hemorrhagic cyst. 5. Small left pleural effusion. Diffuse body wall edema suggest third-spacing. Aortic Atherosclerosis (ICD10-I70.0). Electronically Signed   By: Keith Rake M.D.   On: 02/14/2020 23:47   CT HEAD WO CONTRAST  Result Date: 02/14/2020 CLINICAL DATA:  Headache, nausea vomiting, hypertension EXAM: CT HEAD WITHOUT CONTRAST TECHNIQUE: Contiguous axial images were obtained from the base of the skull through the vertex without intravenous contrast. COMPARISON:  12/11/2018 FINDINGS: Brain: Hypodensities throughout the periventricular and subcortical white matter are again noted, most prominent  in the right occipital lobe, consistent with chronic ischemic change. No sign of acute infarct or hemorrhage. Lateral ventricles and midline structures are grossly unremarkable. Chronic right thalamic lacunar infarct again noted. No acute extra-axial fluid collections. No mass effect. Vascular: No hyperdense vessel or unexpected calcification. Skull: Normal. Negative for fracture or focal lesion. Sinuses/Orbits: There is opacification of the left sphenoid sinus. Remaining paranasal sinuses are clear. Other: None. IMPRESSION: 1. Chronic ischemic changes throughout the periventricular and subcortical white matter. 2. No acute intracranial process. 3. Left sphenoid sinus disease. Electronically Signed   By: Randa Ngo M.D.   On: 02/14/2020 18:56   MR BRAIN WO CONTRAST  Result Date: 02/15/2020 CLINICAL DATA:  Encephalopathy EXAM: MRI HEAD WITHOUT CONTRAST TECHNIQUE: Multiplanar, multiecho pulse sequences of the brain and surrounding structures were obtained without intravenous contrast. COMPARISON:  Brain MRI 12/22/2019 FINDINGS: Brain: No acute infarct, acute hemorrhage or extra-axial collection. Early confluent hyperintense T2-weighted signal of the periventricular and deep white matter, most  commonly due to chronic ischemic microangiopathy. Old right occipital lobe infarct site with encephalomalacia unchanged. Normal volume of CSF spaces. Old right cerebellar and thalamic small vessel infarct. Multifocal chronic microhemorrhage in Ramaj Frangos predominantly central distribution, consistent with chronic hypertensive angiopathy. Normal midline structures. Vascular: Normal flow voids. Skull and upper cervical spine: Normal marrow signal. Sinuses/Orbits: Chronic sphenoid sinus disease.  Normal orbits. Other: None. IMPRESSION: 1. No acute intracranial abnormality. 2. Old right occipital lobe infarct, right cerebellar and right thalamic infarcts. 3. Findings of chronic hypertensive angiopathy. Electronically Signed   By: Ulyses Jarred M.D.   On: 02/15/2020 03:22   DG CHEST PORT 1 VIEW  Result Date: 02/14/2020 CLINICAL DATA:  Altered mental status. Nausea and vomiting. EXAM: PORTABLE CHEST 1 VIEW COMPARISON:  05/29/2019 FINDINGS: Patient is rotated. Upper normal heart size. Unchanged mediastinal contours allowing for rotation. Obscuration of left hemidiaphragm likely small pleural effusion. No focal airspace disease. No pulmonary edema. No pneumothorax. Bones are under mineralized. Surgical clips project over the left breast. IMPRESSION: Obscuration of left hemidiaphragm likely small pleural effusion. Borderline cardiomegaly. Electronically Signed   By: Keith Rake M.D.   On: 02/14/2020 23:14    Microbiology: Recent Results (from the past 240 hour(s))  SARS CORONAVIRUS 2 (TAT 6-24 HRS) Nasopharyngeal Nasopharyngeal Swab     Status: None   Collection Time: 02/14/20 11:14 PM   Specimen: Nasopharyngeal Swab  Result Value Ref Range Status   SARS Coronavirus 2 NEGATIVE NEGATIVE Final    Comment: (NOTE) SARS-CoV-2 target nucleic acids are NOT DETECTED. The SARS-CoV-2 RNA is generally detectable in upper and lower respiratory specimens during the acute phase of infection. Negative results do not preclude  SARS-CoV-2 infection, do not rule out co-infections with other pathogens, and should not be used as the sole basis for treatment or other patient management decisions. Negative results must be combined with clinical observations, patient history, and epidemiological information. The expected result is Negative. Fact Sheet for Patients: SugarRoll.be Fact Sheet for Healthcare Providers: https://www.woods-mathews.com/ This test is not yet approved or cleared by the [place] FDA and  has been authorized for detection and/or diagnosis of SARS-CoV-2 by FDA under an Emergency Use Authorization (EUA). This EUA will remain  in effect (meaning this test can be used) for the duration of the COVID-19 declaration under Section 56 4(b)(1) of the Act, 21 U.S.C. section 360bbb-3(b)(1), unless the authorization is terminated or revoked sooner. Performed at [facility], [place] [address] [place], [place] [address]  Respiratory Panel by RT PCR (Flu Jonika Critz&B, Covid) - Nasopharyngeal Swab     Status: None   Collection Time: 02/19/20  1:42 PM   Specimen: Nasopharyngeal Swab  Result Value Ref Range Status   SARS Coronavirus 2 by RT PCR NEGATIVE NEGATIVE Final    Comment: (NOTE) SARS-CoV-2 target nucleic acids are NOT DETECTED. The SARS-CoV-2 RNA is generally detectable in upper respiratoy specimens during the acute phase of infection. The lowest concentration of SARS-CoV-2 viral copies this assay can detect is 131 copies/mL. Gianlucca Szymborski negative result does not preclude SARS-Cov-2 infection and should not be used as the sole basis for treatment or other patient management decisions. Gaylene Moylan negative result may occur with  improper specimen collection/handling, submission of specimen other than nasopharyngeal swab, presence of viral mutation(s) within the areas targeted by this assay, and inadequate number of viral copies (<131 copies/mL). Carlia Bomkamp negative result must be combined  with clinical observations, patient history, and epidemiological information. The expected result is Negative. Fact Sheet for Patients:  PinkCheek.be Fact Sheet for Healthcare Providers:  GravelBags.it This test is not yet ap proved or cleared by the [place] FDA and  has been authorized for detection and/or diagnosis of SARS-CoV-2 by FDA under an Emergency Use Authorization (EUA). This EUA will remain  in effect (meaning this test can be used) for the duration of the COVID-19 declaration under Section 564(b)(1) of the Act, 21 U.S.C. section 360bbb-3(b)(1), unless the authorization is terminated or revoked sooner.    Influenza Nekoda Chock by PCR NEGATIVE NEGATIVE Final   Influenza B by PCR NEGATIVE NEGATIVE Final    Comment: (NOTE) The Xpert Xpress SARS-CoV-2/FLU/RSV assay is intended as an aid in  the diagnosis of influenza from Nasopharyngeal swab specimens and  should not be used as Braidon Chermak sole basis for treatment. Nasal washings and  aspirates are unacceptable for Xpert Xpress SARS-CoV-2/FLU/RSV  testing. Fact Sheet for Patients: PinkCheek.be Fact Sheet for Healthcare Providers: GravelBags.it This test is not yet approved or cleared by the [place] FDA and  has been authorized for detection and/or diagnosis of SARS-CoV-2 by  FDA under an Emergency Use Authorization (EUA). This EUA will remain  in effect (meaning this test can be used) for the duration of the  Covid-19 declaration under Section 564(b)(1) of the Act, 21  U.S.C. section 360bbb-3(b)(1), unless the authorization is  terminated or revoked. Performed at [facility], [place] [address] [place], [place] [address]      Labs: Basic Metabolic Panel: Recent Labs  Lab 02/14/20 1103 02/15/20 0504 02/16/20 0457 02/19/20 1100 02/20/20 0555  NA 132* 132* 132* 133* 136  K 3.8 3.4* 3.8 3.2* 3.0*  CL 89*  92* 92* 89* 95*  CO2 30 29 29 30 28   GLUCOSE 354* 220* 140* 415* 91  BUN 30* 38* 52* 50* 45*  CREATININE 0.76 0.68 0.93 1.17* 0.84  CALCIUM 10.5* 9.6 9.6 9.5 9.6  MG  --  2.1  --   --   --   PHOS  --  3.1  --   --   --    Liver Function Tests: Recent Labs  Lab 02/14/20 1103 02/19/20 1644  AST 28 15  ALT 30 13  ALKPHOS 101 53  BILITOT 1.3* 0.9  PROT 7.0 3.8*  ALBUMIN 3.5 2.1*   Recent Labs  Lab 02/14/20 1103 02/19/20 1644  LIPASE 40 61*   No results for input(s): AMMONIA in the last 168 hours. CBC: Recent Labs  Lab 02/14/20 1103 02/15/20  [id number] 02/19/20 1100 02/20/20 0555  WBC 10.2 9.2 5.7 9.1  NEUTROABS  --   --  4.1 6.4  HGB 16.4* 16.7* 15.2* 16.3*  HCT 47.7* 48.2* 45.6 47.6*  MCV 84.4 84.0 86.2 86.1  PLT 245 261 257 259   Cardiac Enzymes: No results for input(s): CKTOTAL, CKMB, CKMBINDEX, TROPONINI in the last 168 hours. BNP: BNP (last 3 results) No results for input(s): BNP in the last 8760 hours.  ProBNP (last 3 results) Recent Labs    01/14/20 1103 02/02/20 1356 02/11/20 1258  PROBNP 5,532* 5,273* 1,733*    CBG: Recent Labs  Lab 02/16/20 0753 02/19/20 1630 02/19/20 2143 02/20/20 0810 02/20/20 1212  GLUCAP 135* 381* 324* 121* 219*       Signed:  Fayrene Helper MD.  [facility] 02/20/2020, 3:27 PM

## 2020-02-20 NOTE — Op Note (Signed)
[facility] Patient Name: Marisa Forbes Procedure Date : 02/20/2020 MRN: [id number] Attending MD: Jackquline Denmark , MD Date of Birth: [birth year] CSN: [id number] Age: 69 Admit Type: Inpatient Procedure:                Colonoscopy Indications:              Abnormal CT of the GI tract Providers:                Josie Dixon, RN, Elspeth Cho Tech.,                            Technician, Mora Appl, CRNA, Jackquline Denmark, MD Referring MD:              Medicines:                Monitored Anesthesia Care Complications:            No immediate complications. Estimated Blood Loss:     Estimated blood loss: none. Procedure:                Pre-Anesthesia Assessment:                           - Prior to the procedure, a History and Physical                            was performed, and patient medications and                            allergies were reviewed. The patient's tolerance of                            previous anesthesia was also reviewed. The risks                            and benefits of the procedure and the sedation                            options and risks were discussed with the patient.                            All questions were answered, and informed consent                            was obtained. Prior Anticoagulants: The patient has                            taken no previous anticoagulant or antiplatelet                            agents. ASA Grade Assessment: III - A patient with                            severe systemic disease. After reviewing the risks  and benefits, the patient was deemed in                            satisfactory condition to undergo the procedure.                           Rectal examination revealed stool impaction.                            Significant stool in the rectal vault. Colonoscopy                            was aborted. Scope In: Scope Out: Findings:      The digital exam was  abnormal showing rectal impaction. There was       significant stool in the rectal vault. She was manually disimpacted with       resultant large solid stool Impression:               - The procedure was aborted due to inadequate bowel                            prep.                           - Abnormal digital exam showing rectal impaction.                            Manually disimpacted.                           - No specimens collected. Recommendation:           - Return patient to hospital ward for ongoing care.                           - MiraLAX 17 g p.o. once a day.                           - Return to GI clinic in 2-3 weeks. Have                            recommended outpatient colonoscopy.                           - The findings and recommendations were discussed                            with the patient. Procedure Code(s):         Diagnosis Code(s):        --- Professional ---                           K62.89, Other specified diseases of anus and rectum                           R93.3, Abnormal findings on diagnostic imaging of  other parts of digestive tract CPT copyright 2019 [employer]. All rights reserved. The codes documented in this report are preliminary and upon coder review may  be revised to meet current compliance requirements. Jackquline Denmark, MD 02/20/2020 10:20:27 AM This report has been signed electronically. Number of Addenda: 0

## 2020-02-20 NOTE — Care Management (Signed)
[id number] 02-20-20 Patient is currently active with [name]- for RN, PT/OT SW. Case Manager did call the [facility] Liaison to make her aware that patient will transition home today. No further needs from Case Manager at this time. Bethena Roys, RN,BSN Case Manager

## 2020-02-20 NOTE — Anesthesia Preprocedure Evaluation (Addendum)
Anesthesia Evaluation  Patient identified by MRN, date of birth, ID band Patient awake    Reviewed: Allergy & Precautions, NPO status , Patient's Chart, lab work & pertinent test results  Airway Mallampati: II  TM Distance: >3 FB     Dental   Pulmonary pneumonia,    breath sounds clear to auscultation       Cardiovascular hypertension, + CAD, + Past MI, + Peripheral Vascular Disease and +CHF   Rhythm:Irregular Rate:Normal     Neuro/Psych    GI/Hepatic Neg liver ROS, GERD  ,  Endo/Other  diabetes  Renal/GU      Musculoskeletal   Abdominal   Peds  Hematology   Anesthesia Other Findings   Reproductive/Obstetrics                             Anesthesia Physical Anesthesia Plan  ASA: III  Anesthesia Plan: MAC   Post-op Pain Management:    Induction: Intravenous  PONV Risk Score and Plan: 3 and Treatment may vary due to age or medical condition and Propofol infusion  Airway Management Planned: Nasal Cannula, Simple Face Mask and Mask  Additional Equipment:   Intra-op Plan:   Post-operative Plan:   Informed Consent: I have reviewed the patients History and Physical, chart, labs and discussed the procedure including the risks, benefits and alternatives for the proposed anesthesia with the patient or authorized representative who has indicated his/her understanding and acceptance.     Dental advisory given  Plan Discussed with: CRNA and Anesthesiologist  Anesthesia Plan Comments:        Anesthesia Quick Evaluation

## 2020-02-20 NOTE — Interval H&P Note (Signed)
History and Physical Interval Note:  02/20/2020 9:29 AM  Marisa Forbes  has presented today for surgery, with the diagnosis of Colitis.  Segmental..  The various methods of treatment have been discussed with the patient and family. After consideration of risks, benefits and other options for treatment, the patient has consented to  Procedure(s): COLONOSCOPY WITH PROPOFOL (N/A) ESOPHAGOGASTRODUODENOSCOPY (EGD) WITH PROPOFOL (N/A) as a surgical intervention.  The patient's history has been reviewed, patient examined, no change in status, stable for surgery.  I have reviewed the patient's chart and labs.  Questions were answered to the patient's satisfaction.     Jackquline Denmark

## 2020-02-20 NOTE — Progress Notes (Signed)
Inpatient Diabetes Program Recommendations  AACE/ADA: New Consensus Statement on Inpatient Glycemic Control (2015)  Target Ranges:  Prepandial:   less than 140 mg/dL      Peak postprandial:   less than 180 mg/dL (1-2 hours)      Critically ill patients:  140 - 180 mg/dL   Lab Results  Component Value Date   GLUCAP 219 (H) 02/20/2020   HGBA1C 15.0 (H) 02/16/2020    Review of Glycemic Control Results for LEXANI, HARER (MRN [id number]) as of 02/20/2020 16:49  Ref. Range 02/16/2020 07:53 02/19/2020 16:30 02/19/2020 21:43 02/20/2020 08:10 02/20/2020 12:12  Glucose-Capillary Latest Ref Range: 70 - 99 mg/dL 135 (H) 381 (H) 324 (H) 121 (H) 219 (H)    Diabetes history: DM2  Outpatient Diabetes medications: Novolin Relion NPH 10-15 BID + Novolin R 10-15 TID  Current orders for Inpatient glycemic control: Novolog 0-15 tidwc + 0-5 qhs + Lantus 15 units daily  Note-Spoke with patient at bedside.  Reviewed patient's current A1c of 15%. Explained what a A1c is and what it measures. Also reviewed goal A1c with patient, importance of good glucose control @ home, and blood sugar goals.  She admits to eating too many sweets.   Educated patient on The Plate Method and importance of limiting sweets.  She does not drink any sugary drinks.  She checks her CBG 2-3 times a day.  She does not report any hypoglycemic episodes.  Reviewed hypoglycemia and treatment.  Denies difficulties obtaining insulin and supplies.   She uses Relion brand from [employer] for affordability.  She is tearful and states, "I need to do better because I want to live".  Encouraged patient and offered support.  Will  Continue to follow while inpatient.  Thank you, Reche Dixon, RN, BSN Diabetes Coordinator Inpatient Diabetes Program [phone] (team pager from 8a-5p)

## 2020-02-20 NOTE — Transfer of Care (Signed)
Immediate Anesthesia Transfer of Care Note  Patient: Marisa Forbes  Procedure(s) Performed: ESOPHAGOGASTRODUODENOSCOPY (EGD) WITH PROPOFOL (N/A ) BIOPSY  Patient Location: Endoscopy Unit  Anesthesia Type:MAC  Level of Consciousness: drowsy, patient cooperative and responds to stimulation  Airway & Oxygen Therapy: Patient Spontanous Breathing  Post-op Assessment: Report given to RN and Post -op Vital signs reviewed and stable  Post vital signs: Reviewed and stable  Last Vitals:  Vitals Value Taken Time  BP    Temp    Pulse 63 02/20/20 1011  Resp 7 02/20/20 1011  SpO2 99 % 02/20/20 1011  Vitals shown include unvalidated device data.  Last Pain:  Vitals:   02/20/20 0845  TempSrc: Temporal  PainSc: 0-No pain      Patients Stated Pain Goal: 0 (74/82/70 [id number])  Complications: No apparent anesthesia complications

## 2020-02-21 ENCOUNTER — Inpatient Hospital Stay (HOSPITAL_COMMUNITY): Payer: Medicare Other

## 2020-02-21 ENCOUNTER — Inpatient Hospital Stay (HOSPITAL_COMMUNITY)
Admission: EM | Admit: 2020-02-21 | Discharge: 2020-02-26 | DRG: 640 | Disposition: A | Discharge status: Home Health | Payer: Medicare Other | Attending: Family Medicine | Admitting: Family Medicine

## 2020-02-21 ENCOUNTER — Other Ambulatory Visit: Payer: Self-pay

## 2020-02-21 ENCOUNTER — Encounter (HOSPITAL_COMMUNITY): Payer: Self-pay | Admitting: Emergency Medicine

## 2020-02-21 DIAGNOSIS — Z923 Personal history of irradiation: Secondary | ICD-10-CM

## 2020-02-21 DIAGNOSIS — I252 Old myocardial infarction: Secondary | ICD-10-CM

## 2020-02-21 DIAGNOSIS — Z66 Do not resuscitate: Secondary | ICD-10-CM | POA: Diagnosis present

## 2020-02-21 DIAGNOSIS — K529 Noninfective gastroenteritis and colitis, unspecified: Secondary | ICD-10-CM | POA: Diagnosis present

## 2020-02-21 DIAGNOSIS — E7800 Pure hypercholesterolemia, unspecified: Secondary | ICD-10-CM | POA: Diagnosis present

## 2020-02-21 DIAGNOSIS — I255 Ischemic cardiomyopathy: Secondary | ICD-10-CM | POA: Diagnosis present

## 2020-02-21 DIAGNOSIS — Z8673 Personal history of transient ischemic attack (TIA), and cerebral infarction without residual deficits: Secondary | ICD-10-CM

## 2020-02-21 DIAGNOSIS — R634 Abnormal weight loss: Secondary | ICD-10-CM | POA: Diagnosis not present

## 2020-02-21 DIAGNOSIS — E1142 Type 2 diabetes mellitus with diabetic polyneuropathy: Secondary | ICD-10-CM | POA: Diagnosis present

## 2020-02-21 DIAGNOSIS — I2510 Atherosclerotic heart disease of native coronary artery without angina pectoris: Secondary | ICD-10-CM | POA: Diagnosis present

## 2020-02-21 DIAGNOSIS — K648 Other hemorrhoids: Secondary | ICD-10-CM | POA: Diagnosis present

## 2020-02-21 DIAGNOSIS — E785 Hyperlipidemia, unspecified: Secondary | ICD-10-CM | POA: Diagnosis present

## 2020-02-21 DIAGNOSIS — K621 Rectal polyp: Secondary | ICD-10-CM | POA: Diagnosis not present

## 2020-02-21 DIAGNOSIS — K2950 Unspecified chronic gastritis without bleeding: Secondary | ICD-10-CM | POA: Diagnosis present

## 2020-02-21 DIAGNOSIS — L89151 Pressure ulcer of sacral region, stage 1: Secondary | ICD-10-CM | POA: Diagnosis present

## 2020-02-21 DIAGNOSIS — I110 Hypertensive heart disease with heart failure: Secondary | ICD-10-CM | POA: Diagnosis present

## 2020-02-21 DIAGNOSIS — R112 Nausea with vomiting, unspecified: Secondary | ICD-10-CM | POA: Diagnosis not present

## 2020-02-21 DIAGNOSIS — R1013 Epigastric pain: Secondary | ICD-10-CM

## 2020-02-21 DIAGNOSIS — D123 Benign neoplasm of transverse colon: Secondary | ICD-10-CM

## 2020-02-21 DIAGNOSIS — K449 Diaphragmatic hernia without obstruction or gangrene: Secondary | ICD-10-CM | POA: Diagnosis present

## 2020-02-21 DIAGNOSIS — E1165 Type 2 diabetes mellitus with hyperglycemia: Secondary | ICD-10-CM | POA: Diagnosis present

## 2020-02-21 DIAGNOSIS — R55 Syncope and collapse: Secondary | ICD-10-CM | POA: Diagnosis not present

## 2020-02-21 DIAGNOSIS — R58 Hemorrhage, not elsewhere classified: Secondary | ICD-10-CM | POA: Diagnosis not present

## 2020-02-21 DIAGNOSIS — E43 Unspecified severe protein-calorie malnutrition: Secondary | ICD-10-CM | POA: Diagnosis present

## 2020-02-21 DIAGNOSIS — R933 Abnormal findings on diagnostic imaging of other parts of digestive tract: Secondary | ICD-10-CM

## 2020-02-21 DIAGNOSIS — E876 Hypokalemia: Secondary | ICD-10-CM | POA: Diagnosis present

## 2020-02-21 DIAGNOSIS — E860 Dehydration: Secondary | ICD-10-CM | POA: Diagnosis not present

## 2020-02-21 DIAGNOSIS — Z743 Need for continuous supervision: Secondary | ICD-10-CM | POA: Diagnosis not present

## 2020-02-21 DIAGNOSIS — M479 Spondylosis, unspecified: Secondary | ICD-10-CM | POA: Diagnosis present

## 2020-02-21 DIAGNOSIS — Z79899 Other long term (current) drug therapy: Secondary | ICD-10-CM

## 2020-02-21 DIAGNOSIS — Z955 Presence of coronary angioplasty implant and graft: Secondary | ICD-10-CM

## 2020-02-21 DIAGNOSIS — K5730 Diverticulosis of large intestine without perforation or abscess without bleeding: Secondary | ICD-10-CM | POA: Diagnosis present

## 2020-02-21 DIAGNOSIS — N179 Acute kidney failure, unspecified: Secondary | ICD-10-CM | POA: Diagnosis present

## 2020-02-21 DIAGNOSIS — Z794 Long term (current) use of insulin: Secondary | ICD-10-CM

## 2020-02-21 DIAGNOSIS — E1159 Type 2 diabetes mellitus with other circulatory complications: Secondary | ICD-10-CM | POA: Diagnosis not present

## 2020-02-21 DIAGNOSIS — K635 Polyp of colon: Secondary | ICD-10-CM | POA: Diagnosis not present

## 2020-02-21 DIAGNOSIS — N390 Urinary tract infection, site not specified: Secondary | ICD-10-CM | POA: Diagnosis present

## 2020-02-21 DIAGNOSIS — E861 Hypovolemia: Principal | ICD-10-CM | POA: Diagnosis present

## 2020-02-21 DIAGNOSIS — Z833 Family history of diabetes mellitus: Secondary | ICD-10-CM

## 2020-02-21 DIAGNOSIS — I5042 Chronic combined systolic (congestive) and diastolic (congestive) heart failure: Secondary | ICD-10-CM | POA: Diagnosis present

## 2020-02-21 DIAGNOSIS — Z20822 Contact with and (suspected) exposure to covid-19: Secondary | ICD-10-CM | POA: Diagnosis present

## 2020-02-21 DIAGNOSIS — Z9221 Personal history of antineoplastic chemotherapy: Secondary | ICD-10-CM

## 2020-02-21 DIAGNOSIS — Z681 Body mass index (BMI) 19 or less, adult: Secondary | ICD-10-CM

## 2020-02-21 DIAGNOSIS — E1136 Type 2 diabetes mellitus with diabetic cataract: Secondary | ICD-10-CM | POA: Diagnosis present

## 2020-02-21 DIAGNOSIS — K5900 Constipation, unspecified: Secondary | ICD-10-CM | POA: Diagnosis not present

## 2020-02-21 DIAGNOSIS — Z825 Family history of asthma and other chronic lower respiratory diseases: Secondary | ICD-10-CM

## 2020-02-21 DIAGNOSIS — R1010 Upper abdominal pain, unspecified: Secondary | ICD-10-CM | POA: Diagnosis not present

## 2020-02-21 DIAGNOSIS — L8990 Pressure ulcer of unspecified site, unspecified stage: Secondary | ICD-10-CM | POA: Insufficient documentation

## 2020-02-21 DIAGNOSIS — Z5309 Procedure and treatment not carried out because of other contraindication: Secondary | ICD-10-CM | POA: Diagnosis present

## 2020-02-21 DIAGNOSIS — I959 Hypotension, unspecified: Secondary | ICD-10-CM | POA: Diagnosis present

## 2020-02-21 DIAGNOSIS — Z7982 Long term (current) use of aspirin: Secondary | ICD-10-CM

## 2020-02-21 DIAGNOSIS — Z8249 Family history of ischemic heart disease and other diseases of the circulatory system: Secondary | ICD-10-CM

## 2020-02-21 DIAGNOSIS — R1084 Generalized abdominal pain: Secondary | ICD-10-CM | POA: Diagnosis not present

## 2020-02-21 DIAGNOSIS — I951 Orthostatic hypotension: Secondary | ICD-10-CM | POA: Diagnosis present

## 2020-02-21 DIAGNOSIS — Z853 Personal history of malignant neoplasm of breast: Secondary | ICD-10-CM

## 2020-02-21 DIAGNOSIS — K2100 Gastro-esophageal reflux disease with esophagitis, without bleeding: Secondary | ICD-10-CM | POA: Diagnosis present

## 2020-02-21 LAB — CBC
HCT: 49.6 % — ABNORMAL HIGH (ref 36.0–46.0)
Hemoglobin: 16.6 g/dL — ABNORMAL HIGH (ref 12.0–15.0)
MCH: 29.1 pg (ref 26.0–34.0)
MCHC: 33.5 g/dL (ref 30.0–36.0)
MCV: 86.9 fL (ref 80.0–100.0)
Platelets: 243 10*3/uL (ref 150–400)
RBC: 5.71 MIL/uL — ABNORMAL HIGH (ref 3.87–5.11)
RDW: 13.3 % (ref 11.5–15.5)
WBC: 7.8 10*3/uL (ref 4.0–10.5)
nRBC: 0 % (ref 0.0–0.2)

## 2020-02-21 LAB — COMPREHENSIVE METABOLIC PANEL
ALT: 24 U/L (ref 0–44)
AST: 30 U/L (ref 15–41)
Albumin: 3.3 g/dL — ABNORMAL LOW (ref 3.5–5.0)
Alkaline Phosphatase: 93 U/L (ref 38–126)
Anion gap: 12 (ref 5–15)
BUN: 33 mg/dL — ABNORMAL HIGH (ref 8–23)
CO2: 29 mmol/L (ref 22–32)
Calcium: 9.7 mg/dL (ref 8.9–10.3)
Chloride: 92 mmol/L — ABNORMAL LOW (ref 98–111)
Creatinine, Ser: 0.98 mg/dL (ref 0.44–1.00)
GFR calc Af Amer: >60 mL/min (ref >60)
GFR calc non Af Amer: 59 mL/min — ABNORMAL LOW (ref >60)
Glucose, Bld: 240 mg/dL — ABNORMAL HIGH (ref 70–99)
Potassium: 3.8 mmol/L (ref 3.5–5.1)
Sodium: 133 mmol/L — ABNORMAL LOW (ref 135–145)
Total Bilirubin: 1.6 mg/dL — ABNORMAL HIGH (ref 0.3–1.2)
Total Protein: 5.9 g/dL — ABNORMAL LOW (ref 6.5–8.1)

## 2020-02-21 LAB — LACTIC ACID, PLASMA: Lactic Acid, Venous: 1.5 mmol/L (ref 0.5–1.9)

## 2020-02-21 LAB — CBG MONITORING, ED: Glucose-Capillary: 241 mg/dL — ABNORMAL HIGH (ref 70–99)

## 2020-02-21 LAB — LIPASE, BLOOD: Lipase: 67 U/L — ABNORMAL HIGH (ref 11–51)

## 2020-02-21 MED ORDER — CARVEDILOL 25 MG PO TABS
25.0000 mg | ORAL_TABLET | Freq: Two times a day (BID) | ORAL | Status: DC
  Administered 2020-02-22 – 2020-02-23 (×3): 25 mg via ORAL
  Filled 2020-02-21 (×3): qty 1

## 2020-02-21 MED ORDER — SODIUM CHLORIDE 0.9% FLUSH
10.0000 mL | Freq: Two times a day (BID) | INTRAVENOUS | Status: DC
  Administered 2020-02-22 – 2020-02-25 (×6): 10 mL

## 2020-02-21 MED ORDER — ATORVASTATIN CALCIUM 80 MG PO TABS
80.0000 mg | ORAL_TABLET | Freq: Every day | ORAL | Status: DC
  Administered 2020-02-22 – 2020-02-26 (×5): 80 mg via ORAL
  Filled 2020-02-21 (×5): qty 1

## 2020-02-21 MED ORDER — ASPIRIN EC 81 MG PO TBEC
81.0000 mg | DELAYED_RELEASE_TABLET | Freq: Every day | ORAL | Status: DC
  Administered 2020-02-22 – 2020-02-26 (×5): 81 mg via ORAL
  Filled 2020-02-21 (×5): qty 1

## 2020-02-21 MED ORDER — ENOXAPARIN SODIUM 40 MG/0.4ML ~~LOC~~ SOLN: 40.0000 mg | SUBCUTANEOUS | Status: DC

## 2020-02-21 MED ORDER — SODIUM CHLORIDE 0.9% FLUSH: 10.0000 mL | INTRAVENOUS | Status: DC | PRN

## 2020-02-21 MED ORDER — MORPHINE SULFATE (PF) 2 MG/ML IV SOLN: 0.5000 mg | INTRAVENOUS | Status: DC | PRN

## 2020-02-21 MED ORDER — ACETAMINOPHEN 325 MG PO TABS: 650.0000 mg | ORAL_TABLET | Freq: Four times a day (QID) | ORAL | Status: DC | PRN

## 2020-02-21 MED ORDER — ISOSORBIDE MONONITRATE ER 60 MG PO TB24
60.0000 mg | ORAL_TABLET | Freq: Every day | ORAL | Status: DC
  Administered 2020-02-22 – 2020-02-26 (×5): 60 mg via ORAL
  Filled 2020-02-21 (×5): qty 1

## 2020-02-21 MED ORDER — ENOXAPARIN SODIUM 30 MG/0.3ML ~~LOC~~ SOLN
30.0000 mg | SUBCUTANEOUS | Status: DC
  Administered 2020-02-22 – 2020-02-24 (×3): 30 mg via SUBCUTANEOUS
  Filled 2020-02-21 (×3): qty 0.3

## 2020-02-21 MED ORDER — SPIRONOLACTONE 25 MG PO TABS
25.0000 mg | ORAL_TABLET | Freq: Every day | ORAL | Status: DC
  Administered 2020-02-22 – 2020-02-23 (×2): 25 mg via ORAL
  Filled 2020-02-21 (×2): qty 1

## 2020-02-21 MED ORDER — ONDANSETRON HCL 4 MG/2ML IJ SOLN
4.0000 mg | Freq: Four times a day (QID) | INTRAMUSCULAR | Status: DC | PRN
  Administered 2020-02-22: 4 mg via INTRAVENOUS
  Filled 2020-02-21: qty 2

## 2020-02-21 MED ORDER — INSULIN ASPART 100 UNIT/ML ~~LOC~~ SOLN
0.0000 [IU] | Freq: Every day | SUBCUTANEOUS | Status: DC
  Administered 2020-02-23: 4 [IU] via SUBCUTANEOUS
  Administered 2020-02-24: 2 [IU] via SUBCUTANEOUS

## 2020-02-21 MED ORDER — INSULIN GLARGINE 100 UNIT/ML ~~LOC~~ SOLN
10.0000 [IU] | Freq: Every day | SUBCUTANEOUS | Status: DC
  Administered 2020-02-22 – 2020-02-23 (×3): 10 [IU] via SUBCUTANEOUS
  Filled 2020-02-21 (×4): qty 0.1

## 2020-02-21 MED ORDER — SODIUM CHLORIDE 0.9 % IV BOLUS
500.0000 mL | Freq: Once | INTRAVENOUS | Status: AC
  Administered 2020-02-22: 500 mL via INTRAVENOUS

## 2020-02-21 MED ORDER — INSULIN ASPART 100 UNIT/ML ~~LOC~~ SOLN
5.0000 [IU] | Freq: Three times a day (TID) | SUBCUTANEOUS | Status: DC
  Administered 2020-02-22 – 2020-02-25 (×9): 5 [IU] via SUBCUTANEOUS

## 2020-02-21 MED ORDER — EZETIMIBE 10 MG PO TABS
10.0000 mg | ORAL_TABLET | Freq: Every day | ORAL | Status: DC
  Administered 2020-02-22 – 2020-02-25 (×5): 10 mg via ORAL
  Filled 2020-02-21 (×5): qty 1

## 2020-02-21 MED ORDER — IOHEXOL 350 MG/ML SOLN
100.0000 mL | Freq: Once | INTRAVENOUS | Status: AC | PRN
  Administered 2020-02-21: 100 mL via INTRAVENOUS

## 2020-02-21 MED ORDER — PANTOPRAZOLE SODIUM 40 MG IV SOLR
40.0000 mg | INTRAVENOUS | Status: DC
  Administered 2020-02-22 (×2): 40 mg via INTRAVENOUS
  Filled 2020-02-21 (×2): qty 40

## 2020-02-21 MED ORDER — LISINOPRIL 40 MG PO TABS
40.0000 mg | ORAL_TABLET | Freq: Every day | ORAL | Status: DC
  Administered 2020-02-22: 40 mg via ORAL
  Filled 2020-02-21: qty 1

## 2020-02-21 MED ORDER — INSULIN ASPART 100 UNIT/ML ~~LOC~~ SOLN
0.0000 [IU] | Freq: Three times a day (TID) | SUBCUTANEOUS | Status: DC
  Administered 2020-02-22: 2 [IU] via SUBCUTANEOUS
  Administered 2020-02-22: 3 [IU] via SUBCUTANEOUS
  Administered 2020-02-23: 2 [IU] via SUBCUTANEOUS
  Administered 2020-02-23: 5 [IU] via SUBCUTANEOUS
  Administered 2020-02-23: 2 [IU] via SUBCUTANEOUS
  Administered 2020-02-24: 3 [IU] via SUBCUTANEOUS
  Administered 2020-02-24 – 2020-02-25 (×3): 7 [IU] via SUBCUTANEOUS
  Administered 2020-02-25 (×2): 2 [IU] via SUBCUTANEOUS

## 2020-02-21 MED ORDER — POLYETHYLENE GLYCOL 3350 17 G PO PACK
17.0000 g | PACK | Freq: Two times a day (BID) | ORAL | Status: DC
  Filled 2020-02-21: qty 1

## 2020-02-21 NOTE — ED Triage Notes (Signed)
Pt. Was discharged from here yesterday. 

## 2020-02-21 NOTE — H&P (Signed)
History and Physical    Marisa Forbes LZJ:[id number] DOB: [birth year] DOA: 02/21/2020  PCP: McLean-Scocuzza, Nino Glow, MD  Patient coming from: Home, husband at bedside   I have personally briefly reviewed patient's old medical records in [place]  Chief Complaint: intractable abdominal pain, nausea and vomiting  HPI: Marisa Forbes is a 69 y.o. female with medical history significant for Hx of HTN, uncontrolled insulin-dependent Type 2 diabetes, combined CHF, CVA, CAD, HLD and hx of breast cancer who presents with intractable abdominal pain, nausea and vomiting.   She was just at [facility] and observed overnight on 4/22 for syncope due to hypotension and  abdominal pain and decrease PO intake. Upon returning home she tried to eat but then started to vomiting all her food. Emesis was non-bloody but somewhat bilious. She also noted constant dull epigastric pain that was 8/10 at its worse. Now resolved in the ED without any intervention.   At her recent admission, she underwent an upper endoscopy on 4/23/21which  showed Grade B esophagitis and gastritis and a small hiatal hernia. She was recommended to take Protonix 5m PO BID x 8 weeks and then once daily. She was only able to take one dose when she returned home. Colonoscopy was unable to be performed due to inadequate bowel prep with rectal impact that was manually disimpacted. Solid-phase gastric emptying scan was recommended by GI at that time if she has persistent GI problems.   [facility] from [facility]/17-4/19 for intractable vomiting and was diagnosed with suspected gastroenteritis but was also found to have hypertensive encephalopathy that was treated with  antihypertensives.   At that time she had CT abdomen and pelvis at that time showing colonic wall thickening and areas of dilation with recommendation of colonoscopy to exclude underlying colonic neoplasm.  Thinks she has had 30lbs  weight loss but it was because she had a lot of fluid on her legs that resolved with diuretic.    ED Course:  She was afebrile, normotensive on room air.  WBC of 7.8, hemoglobin of 16.6. Na of 133, K of 3.8, glucose of 240, Total bilirubin of 1.6 with normal LFTs. Lactate acid of 1.5.   CT angio of the abdomen pending to evaluate for any mesenteric ischemia.   Review of Systems: Constitutional: No Weight Change, No Fever ENT/Mouth: No sore throat, No Rhinorrhea Eyes: No Eye Pain, No Vision Changes Cardiovascular: No Chest Pain, no SOB Respiratory: No Cough, No Sputum Gastrointestinal: + Nausea,+ Vomiting, No Diarrhea, + Constipation, + Pain Genitourinary: no Urinary Incontinence Musculoskeletal: No Arthralgias, No Myalgias Skin: No Skin Lesions, No Pruritus, Neuro: no Weakness, No Numbness Psych: No Anxiety/Panic, No Depression, + decrease appetite Heme/Lymph: No Bruising, No Bleeding  Past Medical History:  Diagnosis Date  . Arthritis    back  . Balance problems    due to stroke  . Benign head tremor    per pt due to muscle spasm in neck   . [place]   a. 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 S/p chemo with CMF and radiation.  . Cataracts, bilateral   . CHF (congestive heart failure) ([place]    On Monday she said her cardiologist was concerned for possible CHF, but is now improving-per pt.(07/26/15)  . Coronary artery disease    a. STEMI 08/2012: s/p DES to LAD, PTCA to downstream LAD. b. Relook cath same hospitalization: stable  post-PCI anatomy with Stable Diag lesions (unlikely cause of resting angina, not good PCI targets), stable RCA lesion (non flow limiting).  . Diabetes mellitus without complication ([place])    Takes insulin and metformin  . Dysplasia of cervix, low grade (CIN 1)    in 02 s/p LEEP   . GERD (gastroesophageal reflux disease)    food related  . History of cancer chemotherapy    last in August 2014  .  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 History of radiation therapy 06/23/2013-08/08/2013   62.4 gray to left breast  . HTN (hypertension)   . Hypercholesteremia   . Insomnia   . Left kidney mass   . LV dysfunction    a. EF 45-50% at time of STEMI 08/2012. b. Echo 04/2013: EF 45-50%, mild focal basal hypertrophy of septum, grade 1 d/d.  Marland Kitchen Myocardial infarction ([place]) 09/07/12  . Peripheral vision loss    left  . Perirectal abscess    11/2018   . Pleural effusion, bilateral   . Pneumonia   . Renal artery stenosis ([facility])    a. 20% L RAS in 08/2012 by cath. b. Duplex 07/2012: >60% L RAS.  . Stroke, embolic ([place])    a. 96/0454 post cath. with residual reduced vision in left eye; noted right occiptal lobe/PCA infarct   . UTI (urinary tract infection)   . UTI (urinary tract infection)   . Vasovagal syncope    a. 04/2013 - echo stable compared to prior EF 45-50%, negative carotid duplex.    Past Surgical History:  Procedure Laterality Date  . BACK SURGERY     x3, lower back  . BREAST BIOPSY Left 12/03/2012   U/S Core- Malignant  . BREAST LUMPECTOMY Left 01/08/2013  . BREAST LUMPECTOMY WITH NEEDLE LOCALIZATION AND AXILLARY SENTINEL LYMPH NODE BX Left 01/08/2013   Procedure: LEFT BREAST WIRE GUIDED  LUMPECTOMY AND LEFT AXILLARY SENTINEL  NODE BX;  Surgeon: Rolm Bookbinder, MD;  Location: [place];  Service: General;  Laterality: Left;  . CATARACT EXTRACTION Bilateral   . CERVICAL BIOPSY  W/ LOOP ELECTRODE EXCISION     02/2001  . CORONARY ANGIOPLASTY WITH STENT PLACEMENT    . EYE SURGERY     b/l cataract   . INCISION AND DRAINAGE PERIRECTAL ABSCESS N/A 12/11/2018   Procedure: IRRIGATION AND DEBRIDEMENT PERIRECTAL ABSCESS;  Surgeon: Johnathan Hausen, MD;  Location: [facility];  Service: General;  Laterality: N/A;  . LEFT HEART  CATHETERIZATION WITH CORONARY ANGIOGRAM N/A 09/07/2012   Procedure: LEFT HEART CATHETERIZATION WITH CORONARY ANGIOGRAM;  Surgeon: Troy Sine, MD;  Location: [facility] CATH LAB;  Service: Cardiovascular;  Laterality: N/A;  . LEFT HEART CATHETERIZATION WITH CORONARY ANGIOGRAM  09/09/2012   Procedure: LEFT HEART CATHETERIZATION WITH CORONARY ANGIOGRAM;  Surgeon: Leonie Man, MD;  Location: [facility] CATH LAB;  Service: Cardiovascular;;  . PERCUTANEOUS CORONARY STENT INTERVENTION (PCI-S) N/A 09/07/2012   Procedure: PERCUTANEOUS CORONARY STENT INTERVENTION (PCI-S);  Surgeon: Troy Sine, MD;  [facility];  Service: Cardiovascular;  Laterality: N/A;  . PORT-A-CATH REMOVAL N/A 10/14/2013   Procedure: REMOVAL PORT-A-CATH;  Surgeon: Rolm Bookbinder, MD;  Location: [facility];  Service: General;  Laterality: N/A;  . PORTACATH PLACEMENT Right 02/17/2013   Procedure: INSERTION PORT-A-CATH;  Surgeon: Rolm Bookbinder, MD;  Location: [place];  Service: General;  Laterality: Right;  .  THORACENTESIS     12/13/2018 atypical cells not definitely diag. of malignancy      reports that she has never smoked. She has never used smokeless tobacco. She reports previous alcohol use. She reports that she does not use drugs.  No Known Allergies  Family History  Problem Relation Age of Onset  . COPD Mother   . Heart disease Father   . Hypertension Father   . Diabetes Brother   . Hypertension Brother   . Learning disabilities Brother   . Hypertension Brother   . Asthma Daughter   . Cancer Neg Hx   . Hyperlipidemia Neg Hx   . Kidney disease Neg Hx   . Stroke Neg Hx      Prior to Admission medications   Medication Sig Start Date End Date Taking? Authorizing Provider  aspirin 81 MG EC tablet Take 1 tablet (81 mg total) by mouth daily. 04/16/15  Yes Barton Dubois, MD  carvedilol (COREG) 25 MG tablet TAKE 1 TABLET BY MOUTH TWICE A DAY WITH MEALS Patient taking differently: Take 25 mg by mouth 2 (two) times daily  with a meal.  07/14/19  Yes Fay Records, MD  ezetimibe (ZETIA) 10 MG tablet TAKE 1 TABLET BY MOUTH EVERY DAY Patient taking differently: Take 10 mg by mouth at bedtime.  07/15/19  Yes Fay Records, MD  feeding supplement, ENSURE ENLIVE, (ENSURE ENLIVE) LIQD Take 237 mLs by mouth 2 (two) times daily as needed (If PO intakes of meals are poor). Patient taking differently: Take 237 mLs by mouth every other day as needed (if meal intake is poor).  12/14/18  Yes Irene Pap N, DO  furosemide (LASIX) 40 MG tablet Take 80 mg by mouth daily.    Yes [provider]  hydrALAZINE (APRESOLINE) 10 MG tablet Take 1 tablet (10 mg total) by mouth 2 (two) times daily as needed. For BP >130/>80 Patient taking differently: Take 10 mg by mouth 2 (two) times daily as needed (For BP >130/>80).  12/25/18  Yes McLean-Scocuzza, Nino Glow, MD  insulin NPH Human (NOVOLIN N RELION) 100 UNIT/ML injection Inject 0.15 mLs (15 Units total) into the skin 2 (two) times daily before a meal. Patient taking differently: Inject 15 Units into the skin 3 (three) times daily before meals.  11/04/19  Yes Philemon Kingdom, MD  insulin regular (NOVOLIN R RELION) 100 units/mL injection Inject 0.1-0.15 mLs (10-15 Units total) into the skin 3 (three) times daily before meals. Patient taking differently: Inject 15 Units into the skin 3 (three) times daily before meals.  11/04/19  Yes Philemon Kingdom, MD  isosorbide mononitrate (IMDUR) 60 MG 24 hr tablet TAKE 1 TABLET BY MOUTH DAILY. Patient taking differently: Take 60 mg by mouth daily.  07/14/19  Yes Fay Records, MD  lisinopril (ZESTRIL) 40 MG tablet Take 1 tablet (40 mg total) by mouth daily. 02/16/20  Yes Jennye Boroughs, MD  naproxen sodium (ALEVE) 220 MG tablet Take 440 mg by mouth daily as needed (pain/headache).   Yes [provider]  nitroGLYCERIN (NITROSTAT) 0.4 MG SL tablet Place 1 tablet (0.4 mg total) under the tongue every 5 (five) minutes as needed for chest pain.  04/26/18  Yes Fay Records, MD  pantoprazole (PROTONIX) 40 MG tablet Take 1 tablet (40 mg total) by mouth 2 (two) times daily. For 8 weeks, then transition to once daily Patient taking differently: Take 40 mg by mouth See admin instructions. 02/20/2020: take one tablet (40 mg) by mouth  twice daily, then take one tablet (40 mg) once daily 02/20/20 04/20/20 Yes Elodia Florence., MD  polyethylene glycol (MIRALAX / GLYCOLAX) 17 g packet Take 17 g by mouth 2 (two) times daily for 14 days. Once you have a regular bowel movement daily, can decrease to once daily Patient taking differently: Take 17 g by mouth See admin instructions. 02/20/2020 - mix 17 g in 4-8 oz liquid and drink twice daily, once you have a regular bowel movement daily, decrease to once daily 02/20/20 03/05/20 Yes Elodia Florence., MD  spironolactone (ALDACTONE) 25 MG tablet TAKE 1 TABLET BY MOUTH EVERY DAY Patient taking differently: Take 25 mg by mouth daily.  01/19/20  Yes Fay Records, MD  atorvastatin (LIPITOR) 80 MG tablet TAKE 1 TABLET BY MOUTH DAILY. Patient taking differently: Take 80 mg by mouth daily.  07/15/19   Fay Records, MD  glucose blood [facility] VERIO) test strip Use TID 10/03/12   Janith Lima, MD  potassium chloride (KLOR-CON) 10 MEQ tablet Take 1 tablet (10 mEq total) by mouth daily. Patient not taking: Reported on 02/19/2020 02/04/20   Fay Records, MD    Physical Exam: Vitals:   02/21/20 2000 02/21/20 2015 02/21/20 2030 02/21/20 2045  BP: 139/88 135/78 131/90 (!) 150/90  Pulse: 68 68 73 70  Resp:      Temp:      TempSrc:      SpO2: 93% 95% 100% 100%  Weight:      Height:        Constitutional: NAD, calm, comfortable, frail elderly female laying flat in bed Vitals:   02/21/20 2000 02/21/20 2015 02/21/20 2030 02/21/20 2045  BP: 139/88 135/78 131/90 (!) 150/90  Pulse: 68 68 73 70  Resp:      Temp:      TempSrc:      SpO2: 93% 95% 100% 100%  Weight:      Height:       Eyes: PERRL, lids and  conjunctivae normal ENMT: Mucous membranes are moist.  Neck: normal, supple, Respiratory: clear to auscultation bilaterally, no wheezing, no crackles. Normal respiratory effort on room air. No accessory muscle use.  Cardiovascular: Regular rate and rhythm, no murmurs / rubs / gallops. No extremity edema. 2+ pedal pulses.   Abdomen: Mild epigastric tenderness, no masses palpated.  Bowel sounds positive.  Musculoskeletal: no clubbing / cyanosis. No joint deformity upper and lower extremities. Good ROM, no contractures. Normal muscle tone.  Skin: no rashes, lesions, ulcers. No induration Neurologic: CN 2-12 grossly intact. Sensation intact. Strength 5/5 in all 4.  Psychiatric: Normal judgment and insight. Alert and oriented x 3. Normal mood.     Labs on Admission: I have personally reviewed following labs and imaging studies  CBC: Recent Labs  Lab 02/15/20 0504 02/19/20 1100 02/20/20 0555 02/21/20 1927  WBC 9.2 5.7 9.1 7.8  NEUTROABS  --  4.1 6.4  --   HGB 16.7* 15.2* 16.3* 16.6*  HCT 48.2* 45.6 47.6* 49.6*  MCV 84.0 86.2 86.1 86.9  PLT 261 257 259 503   Basic Metabolic Panel: Recent Labs  Lab 02/15/20 0504 02/16/20 0457 02/19/20 1100 02/20/20 0555 02/21/20 1927  NA 132* 132* 133* 136 133*  K 3.4* 3.8 3.2* 3.0* 3.8  CL 92* 92* 89* 95* 92*  CO2 _0 GLUCOSE 220* 140* 415* 91 240*  BUN 38* 52* 50* 45* 33*  CREATININE 0.68 0.93 1.17* 0.84 0.98  CALCIUM  9.6 9.6 9.5 9.6 9.7  MG 2.1  --   --   --   --   PHOS 3.1  --   --   --   --    GFR: Estimated Creatinine Clearance: 38.8 mL/min (by C-G formula based on SCr of 0.98 mg/dL). Liver Function Tests: Recent Labs  Lab 02/19/20 1644 02/21/20 1927  AST 15 30  ALT 13 24  ALKPHOS 53 93  BILITOT 0.9 1.6*  PROT 3.8* 5.9*  ALBUMIN 2.1* 3.3*   Recent Labs  Lab 02/19/20 1644 02/21/20 1927  LIPASE 61* 67*   No results for input(s): AMMONIA in the last 168 hours. Coagulation Profile: No results for input(s):  INR, PROTIME in the last 168 hours. Cardiac Enzymes: No results for input(s): CKTOTAL, CKMB, CKMBINDEX, TROPONINI in the last 168 hours. BNP (last 3 results) Recent Labs    01/14/20 1103 02/02/20 1356 02/11/20 1258  PROBNP 5,532* 5,273* 1,733*   HbA1C: No results for input(s): HGBA1C in the last 72 hours. CBG: Recent Labs  Lab 02/19/20 1630 02/19/20 2143 02/20/20 0810 02/20/20 1212 02/21/20 1801  GLUCAP 381* 324* 121* 219* 241*   Lipid Profile: No results for input(s): CHOL, HDL, LDLCALC, TRIG, CHOLHDL, LDLDIRECT in the last 72 hours. Thyroid Function Tests: No results for input(s): TSH, T4TOTAL, FREET4, T3FREE, THYROIDAB in the last 72 hours. Anemia Panel: No results for input(s): VITAMINB12, FOLATE, FERRITIN, TIBC, IRON, RETICCTPCT in the last 72 hours. Urine analysis:    Component Value Date/Time   COLORURINE YELLOW 09/30/2019 1453   APPEARANCEUR Cloudy (A) 09/30/2019 1453   LABSPEC 1.020 09/30/2019 1453   PHURINE 5.5 09/30/2019 1453   GLUCOSEU >=1000 (A) 09/30/2019 1453   HGBUR LARGE (A) 09/30/2019 1453   BILIRUBINUR NEGATIVE 09/30/2019 1453   KETONESUR NEGATIVE 09/30/2019 1453   PROTEINUR 100 (A) 12/10/2018 2238   UROBILINOGEN 0.2 09/30/2019 1453   NITRITE NEGATIVE 09/30/2019 1453   LEUKOCYTESUR NEGATIVE 09/30/2019 1453    Radiological Exams on Admission: No results found.  EKG: Independently reviewed.   Assessment/Plan  Intractable abdominal pain/esophagitis/gastritis  possibly worsening gastritis/esophagitis but there is concerns for malignancy on CT abdomen scan on 4/17. Unfortunately colonoscopy was unsuccessful at last admission due to inadequate bowel prep will need to consult GI in the morning to discuss whether to re-attempt colonoscopy or gastric emptying study that was recommended in their endoscopy procedure note  PRN morphine and antiemetic  IV PPI   Chronic combined CHF  appears evolemic on exam  Continue Coreg  Insulin dependent Type 2  diabetes Lantus 10 units, Novolog 5 units TID with sliding scale   CAD no issues. stable continue Imdur, statin and Zetia  Hypertension Hold Lasix per recommend from recent admission for hypotension continue lisinopril, spironolactone  DVT prophylaxis:.Lovenox Code Status: Full Family Communication: Plan discussed with patient and her husband at bedside  disposition Plan: Home with at least 2 midnight stays  Consults called:  Admission status: inpatient  Status is: Inpatient  Remains inpatient appropriate because:Ongoing diagnostic testing needed not appropriate for outpatient work up   Dispo: The patient is from: Home              Anticipated d/c is to: Home              Anticipated d/c date is: 2 days              Patient currently is not medically stable to d/c.          [place]  T Deaveon Schoen DO [facility]   If 7PM-7AM, please contact night-coverage [email or web address]   02/21/2020, 10:10 PM

## 2020-02-21 NOTE — ED Triage Notes (Signed)
EMS stated, pt has been to 2 hospitals for upper abd. Pain. Both places has Dx with gastritis and treated with Protonix. Here for the same symptoms, started vomiting this morning and it looked like coffee grains.

## 2020-02-21 NOTE — ED Provider Notes (Signed)
[facility] EMERGENCY DEPARTMENT Provider Note   CSN: [id number] Arrival date & time: 02/21/20  1205     History Chief Complaint  Patient presents with  . Abdominal Pain  . Nausea  . Emesis    Marisa Forbes is a 69 y.o. female.  Patient is a 69 y.o female with PMHx signfiicant for HTN, HFrEF (EF 20-25% on 01/02/19), CAD, T2DM, reflux presenting for continued abdominal pain. Patient was recently discharged on 4/23 with similar complaints. Patient was admitted on 4/23 and discharged yesterday with presyncope and hypotension.  During that admission patient also had significant abdominal pain and had GI involvement.  GI was able to perform an EGD showing esophagitis and gastritis.  Recommended for PPI twice daily x8 weeks then daily.  Colonoscopy was attempted but patient had inadequate bowel prep.  Was recommended for outpatient follow-up.  Patient reports since then she has continued abdominal pain.  States that she has not stopped having abdominal pain since hospitalization.  She has been vomiting since then as well.  Unable to keep any p.o. even water.  Did check her CBG and it was in the 300s.  Was not able to take her blood pressure medicines this morning due to vomiting.  Denies any radiation of pain.  Denies any blood in stool or emesis.  Denies any fevers or other sick contacts.  Does report some dizziness which she thinks is related to her blood pressure.  Of note husband does report a 30 pound weight loss recently which they initially thought was related to her Lasix.  Denies any tobacco use.  Denies any loss of consciousness.        Past Medical History:  Diagnosis Date  . Arthritis    back  . Balance problems    due to stroke  . Benign head tremor    per pt due to muscle spasm in neck   . Breast cancer ([place]) 12/03/12   a. 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 S/p chemo with CMF and radiation.  . Cataracts, bilateral   .  CHF (congestive heart failure) ([place])    On Monday she said her cardiologist was concerned for possible CHF, but is now improving-per pt.(07/26/15)  . Coronary artery disease    a. STEMI 08/2012: s/p DES to LAD, PTCA to downstream LAD. b. Relook cath same hospitalization: stable post-PCI anatomy with Stable Diag lesions (unlikely cause of resting angina, not good PCI targets), stable RCA lesion (non flow limiting).  . Diabetes mellitus without complication ([place])    Takes insulin and metformin  . Dysplasia of cervix, low grade (CIN 1)    in 02 s/p LEEP   . GERD (gastroesophageal reflux disease)    food related  . History of cancer chemotherapy    last in August 2014  . 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 History of radiation therapy 06/23/2013-08/08/2013   62.4 gray to left breast  . HTN (hypertension)   . Hypercholesteremia   . Insomnia   . Left kidney mass   . LV dysfunction    a. EF 45-50% at time of STEMI 08/2012. b. Echo 04/2013: EF 45-50%, mild focal basal hypertrophy of septum, grade 1 d/d.  Marland Kitchen Myocardial infarction ([place]) 09/07/12  . Peripheral vision loss    left  . Perirectal abscess  11/2018   . Pleural effusion, bilateral   . Pneumonia   . Renal artery stenosis ([facility])    a. 20% L RAS in 08/2012 by cath. b. Duplex 07/2012: >60% L RAS.  . 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 75/4360 post cath. with residual reduced vision in left eye; noted right occiptal lobe/PCA infarct   . UTI (urinary tract infection)   . UTI (urinary tract infection)   . Vasovagal syncope    a. 04/2013 - echo stable compared to prior EF 45-50%, negative carotid duplex.    Patient Active Problem List   Diagnosis Date Noted  . Impaction of colon ([place])   . Hypotension 02/19/2020  . Near syncope 02/19/2020  . Nausea and vomiting  02/14/2020  . Hypertensive emergency 02/14/2020  . Hypertensive encephalopathy 02/14/2020  . Hyperglycemia due to type 2 diabetes mellitus ([place]) 02/14/2020  . Weakness 01/20/2020  . Cerebrovascular accident (CVA) ([place]) 01/20/2020  . Gait abnormality 01/20/2020  . Mobility impaired 01/07/2020  . Sinusitis 12/28/2019  . Decreased activities of daily living (ADL) 12/05/2019  . Physical deconditioning 12/05/2019  . Impaired instrumental activities of daily living (IADL) 12/05/2019  . Abnormal gait 12/05/2019  . Insomnia 12/25/2018  . Peri-rectal abscess 12/12/2018  . Perirectal abscess 12/11/2018  . Left renal mass 12/11/2018  . Pleural effusion 12/11/2018  . Lower urinary tract infectious disease   . Proctitis   . Unintentional weight loss   . Chronic combined systolic (congestive) and diastolic (congestive) heart failure ([place]) 01/22/2018  . Elevated troponin 12/22/2016  . Chest pain, rule out acute myocardial infarction 07/29/2015  . 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 Poorly controlled type 2 diabetes mellitus with circulatory disorder ([place])   . Ischemic cardiomyopathy   . 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 3rd cranial nerve palsy   . Diplopia   . CVA (cerebral infarction) 04/11/2015  . Renal artery stenosis ([place])   . LV dysfunction   . Benign paroxysmal positional vertigo 05/19/2013  . Cancer of upper-inner quadrant of female breast ([place]) 12/06/2012  . Cervical dystonia 10/18/2012  . Peripheral neuropathy 10/18/2012  . Other screening mammogram 10/03/2012  . History of completed stroke 09/12/2012    Class: Acute  . Hyperlipidemia with target LDL less than 70 09/09/2012  . CAD (coronary artery disease) 09/09/2012  . NSVT, 5 beats 11/10 09/09/2012  . Hypertensive heart disease with CHF (congestive heart failure) ([place]) 09/07/2012    Past Surgical History:  Procedure Laterality Date  . BACK SURGERY     x3, lower back  . BREAST BIOPSY Left 12/03/2012    U/S Core- Malignant  . BREAST LUMPECTOMY Left 01/08/2013  . BREAST LUMPECTOMY WITH NEEDLE LOCALIZATION AND AXILLARY SENTINEL LYMPH NODE BX Left 01/08/2013   Procedure: LEFT BREAST WIRE GUIDED  LUMPECTOMY AND LEFT AXILLARY SENTINEL  NODE BX;  Surgeon: Rolm Bookbinder, MD;  Location: [facility];  Service: General;  Laterality: Left;  . CATARACT EXTRACTION Bilateral   . CERVICAL BIOPSY  W/ LOOP ELECTRODE EXCISION     02/2001  . CORONARY ANGIOPLASTY WITH STENT PLACEMENT    . EYE SURGERY     b/l cataract   . INCISION AND DRAINAGE PERIRECTAL ABSCESS N/A 12/11/2018   Procedure: IRRIGATION AND DEBRIDEMENT PERIRECTAL ABSCESS;  Surgeon: Johnathan Hausen, MD;  Location: [facility];  Service: General;  Laterality: N/A;  . LEFT HEART CATHETERIZATION WITH CORONARY ANGIOGRAM N/A 09/07/2012   Procedure: LEFT HEART CATHETERIZATION WITH CORONARY ANGIOGRAM;  Surgeon: Troy Sine, MD;  Location: [facility] CATH LAB;  Service: Cardiovascular;  Laterality: N/A;  . LEFT HEART CATHETERIZATION WITH CORONARY ANGIOGRAM  09/09/2012   Procedure: LEFT HEART CATHETERIZATION WITH CORONARY ANGIOGRAM;  Surgeon: Leonie Man, MD;  Location: [facility] C[facility];  Service: Cardiovascular;;  . PERCUTANEOUS CORONARY STENT INTERVENTION (PCI-S) N/A 09/07/2012   Procedure: PERCUTANEOUS CORONARY STENT INTERVENTION (PCI-S);  Surgeon: Troy Sine, MD;  Location: [facility] CATH LAB;  Service: Cardiovascular;  Laterality: N/A;  . PORT-A-CATH REMOVAL N/A 10/14/2013   Procedure: REMOVAL PORT-A-CATH;  Surgeon: Rolm Bookbinder, MD;  Location: [facility];  Service: General;  Laterality: N/A;  . PORTACATH PLACEMENT Right 02/17/2013   Procedure: INSERTION PORT-A-CATH;  Surgeon: Rolm Bookbinder, MD;  Location: [facility];  Service: General;  Laterality: Right;  . THORACENTESIS     12/13/2018 atypical cells not definitely diag. of malignancy      OB History   No obstetric history on file.     Family History  Problem Relation Age of Onset  . COPD Mother   . Heart disease  Father   . Hypertension Father   . Diabetes Brother   . Hypertension Brother   . Learning disabilities Brother   . Hypertension Brother   . Asthma Daughter   . Cancer Neg Hx   . Hyperlipidemia Neg Hx   . Kidney disease Neg Hx   . Stroke Neg Hx     Social History   Tobacco Use  . Smoking status: Never Smoker  . Smokeless tobacco: Never Used  Substance Use Topics  . Alcohol use: Not Currently  . Drug use: No    Home Medications Prior to Admission medications   Medication Sig Start Date End Date Taking? Authorizing Provider  aspirin 81 MG EC tablet Take 1 tablet (81 mg total) by mouth daily. 04/16/15  Yes Barton Dubois, MD  carvedilol (COREG) 25 MG tablet TAKE 1 TABLET BY MOUTH TWICE A DAY WITH MEALS Patient taking differently: Take 25 mg by mouth 2 (two) times daily with a meal.  07/14/19  Yes Fay Records, MD  ezetimibe (ZETIA) 10 MG tablet TAKE 1 TABLET BY MOUTH EVERY DAY Patient taking differently: Take 10 mg by mouth at bedtime.  07/15/19  Yes Fay Records, MD  feeding supplement, ENSURE ENLIVE, (ENSURE ENLIVE) LIQD Take 237 mLs by mouth 2 (two) times daily as needed (If PO intakes of meals are poor). Patient taking differently: Take 237 mLs by mouth every other day as needed (if meal intake is poor).  12/14/18  Yes Irene Pap N, DO  furosemide (LASIX) 40 MG tablet Take 80 mg by mouth daily.    Yes [provider]  hydrALAZINE (APRESOLINE) 10 MG tablet Take 1 tablet (10 mg total) by mouth 2 (two) times daily as needed. For BP >130/>80 Patient taking differently: Take 10 mg by mouth 2 (two) times daily as needed (For BP >130/>80).  12/25/18  Yes McLean-Scocuzza, Nino Glow, MD  insulin NPH Human (NOVOLIN N RELION) 100 UNIT/ML injection Inject 0.15 mLs (15 Units total) into the skin 2 (two) times daily before a meal. Patient taking differently: Inject 15 Units into the skin 3 (three) times daily before meals.  11/04/19  Yes Philemon Kingdom, MD  insulin regular (NOVOLIN R  RELION) 100 units/mL injection Inject 0.1-0.15 mLs (10-15 Units total) into the skin 3 (three) times daily before meals. Patient taking differently: Inject 15 Units into the skin 3 (three) times daily before meals.  11/04/19  Yes Philemon Kingdom, MD  isosorbide mononitrate (IMDUR) 60 MG 24 hr tablet TAKE 1 TABLET BY MOUTH DAILY. Patient taking differently: Take 60 mg by mouth daily.  07/14/19  Yes Fay Records, MD  lisinopril (ZESTRIL) 40 MG tablet Take 1 tablet (40 mg total) by mouth daily. 02/16/20  Yes Jennye Boroughs, MD  naproxen sodium (ALEVE) 220 MG tablet Take 440 mg by mouth daily as needed (pain/headache).   Yes [provider]  nitroGLYCERIN (NITROSTAT) 0.4 MG SL tablet Place 1 tablet (0.4 mg total) under the tongue every 5 (five) minutes as needed for chest pain. 04/26/18  Yes Fay Records, MD  pantoprazole (PROTONIX) 40 MG tablet Take 1 tablet (40 mg total) by mouth 2 (two) times daily. For 8 weeks, then transition to once daily Patient taking differently: Take 40 mg by mouth See admin instructions. 02/20/2020: take one tablet (40 mg) by mouth twice daily, then take one tablet (40 mg) once daily 02/20/20 04/20/20 Yes Elodia Florence., MD  polyethylene glycol (MIRALAX / GLYCOLAX) 17 g packet Take 17 g by mouth 2 (two) times daily for 14 days. Once you have a regular bowel movement daily, can decrease to once daily Patient taking differently: Take 17 g by mouth See admin instructions. 02/20/2020 - mix 17 g in 4-8 oz liquid and drink twice daily, once you have a regular bowel movement daily, decrease to once daily 02/20/20 03/05/20 Yes Elodia Florence., MD  spironolactone (ALDACTONE) 25 MG tablet TAKE 1 TABLET BY MOUTH EVERY DAY Patient taking differently: Take 25 mg by mouth daily.  01/19/20  Yes Fay Records, MD  atorvastatin (LIPITOR) 80 MG tablet TAKE 1 TABLET BY MOUTH DAILY. Patient taking differently: Take 80 mg by mouth daily.  07/15/19   Fay Records, MD  glucose blood  [facility] VERIO) test strip Use TID 10/03/12   Janith Lima, MD  potassium chloride (KLOR-CON) 10 MEQ tablet Take 1 tablet (10 mEq total) by mouth daily. Patient not taking: Reported on 02/19/2020 02/04/20   Fay Records, MD    Allergies    Patient has no known allergies.  Review of Systems   Review of Systems  Constitutional: Negative for chills and fever.  Respiratory: Negative for shortness of breath.   Cardiovascular: Negative for chest pain.  Gastrointestinal: Positive for abdominal pain, nausea and vomiting. Negative for blood in stool and diarrhea.  Genitourinary: Negative for dysuria, frequency and urgency.    Physical Exam Updated Vital Signs BP 131/90   Pulse 73   Temp 98.9 F (37.2 C) (Oral)   Resp 16   Ht _0  (1.651 m)   Wt 45.4 kg   SpO2 100%   BMI 16.64 kg/m   Physical Exam Constitutional:      General: She is not in acute distress. HENT:     Mouth/Throat:     Mouth: Mucous membranes are moist.     Pharynx: Oropharynx is clear.  Cardiovascular:     Rate and Rhythm: Normal rate and regular rhythm.     Heart sounds: No murmur. No friction rub. No gallop.   Pulmonary:     Effort: No respiratory distress.     Breath sounds: No wheezing, rhonchi or rales.  Abdominal:     General: Abdomen is flat. Bowel sounds are normal. There is no distension.     Palpations: Abdomen is soft. There is no hepatomegaly or splenomegaly.     Tenderness: There is abdominal tenderness in the epigastric area. Negative  signs include Murphy's sign, Rovsing's sign and McBurney's sign.     Hernia: No hernia is present.  Skin:    General: Skin is warm.     Findings: No rash.  Neurological:     General: No focal deficit present.     Mental Status: She is alert.  Psychiatric:        Mood and Affect: Mood normal.     ED Results / Procedures / Treatments   Labs (all labs ordered are listed, but only abnormal results are displayed) Labs Reviewed  COMPREHENSIVE METABOLIC PANEL  - Abnormal; Notable for the following components:      Result Value   Sodium 133 (*)    Chloride 92 (*)    Glucose, Bld 240 (*)    BUN 33 (*)    Total Protein 5.9 (*)    Albumin 3.3 (*)    Total Bilirubin 1.6 (*)    GFR calc non Af Amer 59 (*)    All other components within normal limits  CBC - Abnormal; Notable for the following components:   RBC 5.71 (*)    Hemoglobin 16.6 (*)    HCT 49.6 (*)    All other components within normal limits  LIPASE, BLOOD - Abnormal; Notable for the following components:   Lipase 67 (*)    All other components within normal limits  CBG MONITORING, ED - Abnormal; Notable for the following components:   Glucose-Capillary 241 (*)    All other components within normal limits  RESPIRATORY PANEL BY RT PCR (FLU A&B, COVID)  LACTIC ACID, PLASMA  LACTIC ACID, PLASMA    EKG None  Radiology No results found.  Procedures Procedures (including critical care time)  Medications Ordered in ED Medications  sodium chloride 0.9 % bolus 500 mL (has no administration in time range)    ED Course  I have reviewed the triage vital signs and the nursing notes.  Pertinent labs & imaging results that were available during my care of the patient were reviewed by me and considered in my medical decision making (see chart for details).    MDM Rules/Calculators/A&P                      Patient presenting for continued abdominal pain.  Recently discharged with consultation with GI. CT abd/pelvis from 02/14/20 showing colonic wall thickening involving the cecum, transverse colon, rectum.  Was recommended for colonoscopy to exclude colonic neoplasm.  Unfortunate was unable to do colonoscopy as an inpatient secondary to inadequate bowel prep.  EGD did show esophagitis and gastritis and biopsies were obtained, they are still pending.  Patient continues to have pain.  Only epigastric pain on physical exam but does report upper abdominal pain throughout.  Unable to tolerate  any p.o.  Given worsening abdominal pain we will plan to obtain CT angios to rule out mesenteric ischemia or other pathology.  Blood pressure was low so we will give 500 cc bolus, will need to be judicious with fluids given history of heart failure.  Also obtain CMP, CBC, lipase as well as lactate.  Pending work-up may need readmission for worsening abdominal pain and inability to take p.o.  20:38 Lab work showing no significant changes. Patient continues to have abdominal pain. Given continued pain and inability to tolerate PO will plan to admit to hospitalist service. Have paged for admission, awaiting callback  20:56 Spoke to Dr. Flossie Buffy who will admit patient.   Discussed patient with Dr.  Zavitz who also saw patient  Final Clinical Impression(s) / ED Diagnoses Final diagnoses:  Nausea and vomiting, intractability of vomiting not specified, unspecified vomiting type  Epigastric pain    Rx / DC Orders ED Discharge Orders    None       Caroline More, DO 02/21/20 2057    Elnora Morrison, MD 02/22/20 [phone]

## 2020-02-22 DIAGNOSIS — Z794 Long term (current) use of insulin: Secondary | ICD-10-CM

## 2020-02-22 DIAGNOSIS — L8990 Pressure ulcer of unspecified site, unspecified stage: Secondary | ICD-10-CM

## 2020-02-22 DIAGNOSIS — E1165 Type 2 diabetes mellitus with hyperglycemia: Secondary | ICD-10-CM

## 2020-02-22 LAB — CBC
HCT: 40.4 % (ref 36.0–46.0)
Hemoglobin: 13.5 g/dL (ref 12.0–15.0)
MCH: 28.9 pg (ref 26.0–34.0)
MCHC: 33.4 g/dL (ref 30.0–36.0)
MCV: 86.5 fL (ref 80.0–100.0)
Platelets: 195 10*3/uL (ref 150–400)
RBC: 4.67 MIL/uL (ref 3.87–5.11)
RDW: 13.5 % (ref 11.5–15.5)
WBC: 5.4 10*3/uL (ref 4.0–10.5)
nRBC: 0 % (ref 0.0–0.2)

## 2020-02-22 LAB — RESPIRATORY PANEL BY RT PCR (FLU A&B, COVID)
Influenza A by PCR: NEGATIVE
Influenza B by PCR: NEGATIVE
SARS Coronavirus 2 by RT PCR: NEGATIVE

## 2020-02-22 LAB — GLUCOSE, CAPILLARY
Glucose-Capillary: 161 mg/dL — ABNORMAL HIGH (ref 70–99)
Glucose-Capillary: 124 mg/dL — ABNORMAL HIGH (ref 70–99)
Glucose-Capillary: 234 mg/dL — ABNORMAL HIGH (ref 70–99)
Glucose-Capillary: 189 mg/dL — ABNORMAL HIGH (ref 70–99)

## 2020-02-22 LAB — BASIC METABOLIC PANEL
Anion gap: 9 (ref 5–15)
BUN: 29 mg/dL — ABNORMAL HIGH (ref 8–23)
CO2: 29 mmol/L (ref 22–32)
Calcium: 8.8 mg/dL — ABNORMAL LOW (ref 8.9–10.3)
Chloride: 97 mmol/L — ABNORMAL LOW (ref 98–111)
Creatinine, Ser: 0.81 mg/dL (ref 0.44–1.00)
GFR calc Af Amer: >60 mL/min (ref >60)
GFR calc non Af Amer: >60 mL/min (ref >60)
Glucose, Bld: 209 mg/dL — ABNORMAL HIGH (ref 70–99)
Potassium: 3.2 mmol/L — ABNORMAL LOW (ref 3.5–5.1)
Sodium: 135 mmol/L (ref 135–145)

## 2020-02-22 LAB — CBG MONITORING, ED: Glucose-Capillary: 180 mg/dL — ABNORMAL HIGH (ref 70–99)

## 2020-02-22 LAB — LACTIC ACID, PLASMA: Lactic Acid, Venous: 1.2 mmol/L (ref 0.5–1.9)

## 2020-02-22 MED ORDER — POTASSIUM CHLORIDE CRYS ER 20 MEQ PO TBCR
40.0000 meq | EXTENDED_RELEASE_TABLET | Freq: Once | ORAL | Status: AC
  Administered 2020-02-22: 40 meq via ORAL
  Filled 2020-02-22: qty 2

## 2020-02-22 MED ORDER — POLYETHYLENE GLYCOL 3350 17 GM/SCOOP PO POWD
1.0000 | Freq: Once | ORAL | Status: DC
  Filled 2020-02-22: qty 255

## 2020-02-22 MED ORDER — BISACODYL 10 MG RE SUPP: 10.0000 mg | Freq: Once | RECTAL | Status: DC

## 2020-02-22 MED ORDER — BISACODYL 10 MG RE SUPP
10.0000 mg | Freq: Once | RECTAL | Status: AC
  Administered 2020-02-22: 10 mg via RECTAL
  Filled 2020-02-22: qty 1

## 2020-02-22 MED ORDER — LISINOPRIL 20 MG PO TABS
20.0000 mg | ORAL_TABLET | Freq: Every day | ORAL | Status: DC
  Administered 2020-02-23 – 2020-02-26 (×4): 20 mg via ORAL
  Filled 2020-02-22 (×4): qty 1

## 2020-02-22 NOTE — Progress Notes (Signed)
Progress Note    Marisa Forbes  [id number] DOB: 07/05/1951  DOA: 02/21/2020 PCP: McLean-Scocuzza, Nino Glow, MD    Brief Narrative:     Medical records reviewed and are as summarized below:  Marisa Forbes is an 69 y.o. female  with medical history significant for Hx of HTN, uncontrolled insulin-dependent Type 2 diabetes, combined CHF, CVA, CAD, HLD and hx of breast cancer who presents with intractable abdominal pain, nausea and vomiting.   She was just at [facility] and observed overnight on 4/22 for syncope due to hypotension and  abdominal pain and decrease PO intake. Upon returning home she tried to eat but then started to vomiting all her food. Emesis was non-bloody but somewhat bilious. She also noted constant dull epigastric pain that was 8/10 at its worse. Now resolved in the ED without any intervention.    Assessment/Plan:   Active Problems:   CAD (coronary artery disease)   Poorly controlled type 2 diabetes mellitus with circulatory disorder (HCC)   Chronic combined systolic (congestive) and diastolic (congestive) heart failure (HCC)   Hyperglycemia due to type 2 diabetes mellitus (HCC)   Intractable vomiting with nausea   Pressure injury of skin   abdominal pain/esophagitis/gastritis  -Multiple hospitalizations/ER visits in the last month possibly worsening gastritis/esophagitis but there is concerns for malignancy on CT abdomen scan on 4/17. Unfortunately colonoscopy was unsuccessful at last admission due to inadequate bowel prep CTA done in the ER last night: Moderate to large amount of stool in the right colon and cecum IV PPI  -We will start patient on bowel regimen and ensure that she is no longer impacted -Not having any current abdominal pain now -We will also rule out UTI with urinalysis -Patient already has an appointment with Dr. Lyndel Safe on May 18-if patient does not progress will reconsult for possible colonoscopy  Chronic combined CHF  appears dry  on exam Continue Coreg  Insulin dependent Type 2 diabetes Lantus 10 units, Novolog 5 units TID with sliding scale   CAD no issues. stable continue Imdur, statin and Zetia  Hypokalemia -Replete  Hypertension Hold Lasix per recommend from recent admission for hypotension continue lisinopril, spironolactone but at a lower dose  Pressure Injury 02/22/20 Coccyx Posterior;Mid Stage 1 -  Intact skin with non-blanchable redness of a localized area usually over a bony prominence. (Active)  02/22/20 0309  Location: Coccyx  Location Orientation: Posterior;Mid  Staging: Stage 1 -  Intact skin with non-blanchable redness of a localized area usually over a bony prominence.  Wound Description (Comments):   Present on Admission: Yes       Family Communication/Anticipated D/C date and plan/Code Status   DVT prophylaxis: Lovenox ordered. Code Status: DNR Family Communication:  Disposition Plan: Status is: Inpatient  Remains inpatient appropriate because:Inpatient level of care appropriate due to severity of illness   Dispo: The patient is from: Home              Anticipated d/c is to: Home              Anticipated d/c date is: 2 days              Patient currently is not medically stable to d/c.          Medical Consultants:    None.     Subjective:   Patient describe discomfort more as a pressure  Objective:    Vitals:   02/22/20 0300 02/22/20 0600 02/22/20 1225 02/22/20  1248  BP: (!) 147/90 (!) 142/91 (!) 76/56 108/82  Pulse: 69 67 72   Resp:  19 18   Temp: 97.6 F (36.4 C) (!) 97.5 F (36.4 C) 98.5 F (36.9 C)   TempSrc: Oral Oral Axillary   SpO2: 98% 97%    Weight:  46 kg    Height:        Intake/Output Summary (Last 24 hours) at 02/22/2020 1347 Last data filed at 02/22/2020 0300 Gross per 24 hour  Intake --  Output 0 ml  Net 0 ml   Filed Weights   02/21/20 1214 02/22/20 0600  Weight: 45.4 kg 46 kg    Exam:  General: Appearance:     Thin chronically ill appearing female in no acute distress     Lungs:     Clear to auscultation bilaterally, respirations unlabored  Heart:    Normal heart rate. Normal rhythm. No murmurs, rubs, or gallops.   MS:   All extremities are intact.   Neurologic:   Awake, alert, oriented x 3. No apparent focal neurological           defect.     Data Reviewed:   I have personally reviewed following labs and imaging studies:  Labs: Labs show the following:   Basic Metabolic Panel: Recent Labs  Lab 02/16/20 0457 02/16/20 0457 02/19/20 1100 02/19/20 1100 02/20/20 0555 02/20/20 0555 02/21/20 1927 02/22/20 0419  NA 132*  --  133*  --  136  --  133* 135  K 3.8   < > 3.2*   < > 3.0*   < > 3.8 3.2*  CL 92*  --  89*  --  95*  --  92* 97*  CO2 29  --  30  --  28  --  29 29  GLUCOSE 140*  --  415*  --  91  --  240* 209*  BUN 52*  --  50*  --  45*  --  33* 29*  CREATININE 0.93  --  1.17*  --  0.84  --  0.98 0.81  CALCIUM 9.6  --  9.5  --  9.6  --  9.7 8.8*   < > = values in this interval not displayed.   GFR Estimated Creatinine Clearance: 47.6 mL/min (by C-G formula based on SCr of 0.81 mg/dL). Liver Function Tests: Recent Labs  Lab 02/19/20 1644 02/21/20 1927  AST 15 30  ALT 13 24  ALKPHOS 53 93  BILITOT 0.9 1.6*  PROT 3.8* 5.9*  ALBUMIN 2.1* 3.3*   Recent Labs  Lab 02/19/20 1644 02/21/20 1927  LIPASE 61* 67*   No results for input(s): AMMONIA in the last 168 hours. Coagulation profile No results for input(s): INR, PROTIME in the last 168 hours.  CBC: Recent Labs  Lab 02/19/20 1100 02/20/20 0555 02/21/20 1927 02/22/20 0419  WBC 5.7 9.1 7.8 5.4  NEUTROABS 4.1 6.4  --   --   HGB 15.2* 16.3* 16.6* 13.5  HCT 45.6 47.6* 49.6* 40.4  MCV 86.2 86.1 86.9 86.5  PLT 257 259 243 195   Cardiac Enzymes: No results for input(s): CKTOTAL, CKMB, CKMBINDEX, TROPONINI in the last 168 hours. BNP (last 3 results) Recent Labs    01/14/20 1103 02/02/20 1356 02/11/20 1258    PROBNP 5,532* 5,273* 1,733*   CBG: Recent Labs  Lab 02/20/20 1212 02/21/20 1801 02/22/20 0026 02/22/20 0617 02/22/20 1147  GLUCAP 219* 241* 180* 161* 124*   D-Dimer: No results for input(s):  DDIMER in the last 72 hours. Hgb A1c: No results for input(s): HGBA1C in the last 72 hours. Lipid Profile: No results for input(s): CHOL, HDL, LDLCALC, TRIG, CHOLHDL, LDLDIRECT in the last 72 hours. Thyroid function studies: No results for input(s): TSH, T4TOTAL, T3FREE, THYROIDAB in the last 72 hours.  Invalid input(s): FREET3 Anemia work up: No results for input(s): VITAMINB12, FOLATE, FERRITIN, TIBC, IRON, RETICCTPCT in the last 72 hours. Sepsis Labs: Recent Labs  Lab 02/19/20 1100 02/20/20 0555 02/21/20 1927 02/22/20 0419  WBC 5.7 9.1 7.8 5.4  LATICACIDVEN  --   --  1.5 1.2    Microbiology Recent Results (from the past 240 hour(s))  SARS CORONAVIRUS 2 (TAT 6-24 HRS) Nasopharyngeal Nasopharyngeal Swab     Status: None   Collection Time: 02/14/20 11:14 PM   Specimen: Nasopharyngeal Swab  Result Value Ref Range Status   SARS Coronavirus 2 NEGATIVE NEGATIVE Final    Comment: (NOTE) SARS-CoV-2 target nucleic acids are NOT DETECTED. The SARS-CoV-2 RNA is generally detectable in upper and lower respiratory specimens during the acute phase of infection. Negative results do not preclude SARS-CoV-2 infection, do not rule out co-infections with other pathogens, and should not be used as the sole basis for treatment or other patient management decisions. Negative results must be combined with clinical observations, patient history, and epidemiological information. The expected result is Negative. Fact Sheet for Patients: SugarRoll.be Fact Sheet for Healthcare Providers: https://www.woods-mathews.com/ This test is not yet approved or cleared by the [place] FDA and  has been authorized for detection and/or diagnosis of SARS-CoV-2  by FDA under an Emergency Use Authorization (EUA). This EUA will remain  in effect (meaning this test can be used) for the duration of the COVID-19 declaration under Section 56 4(b)(1) of the Act, 21 U.S.C. section 360bbb-3(b)(1), unless the authorization is terminated or revoked sooner. Performed at [facility], [place] [address] [place], [place] [address]   Respiratory Panel by RT PCR (Flu A&B, Covid) - Nasopharyngeal Swab     Status: None   Collection Time: 02/19/20  1:42 PM   Specimen: Nasopharyngeal Swab  Result Value Ref Range Status   SARS Coronavirus 2 by RT PCR NEGATIVE NEGATIVE Final    Comment: (NOTE) SARS-CoV-2 target nucleic acids are NOT DETECTED. The SARS-CoV-2 RNA is generally detectable in upper respiratoy specimens during the acute phase of infection. The lowest concentration of SARS-CoV-2 viral copies this assay can detect is 131 copies/mL. A negative result does not preclude SARS-Cov-2 infection and should not be used as the sole basis for treatment or other patient management decisions. A negative result may occur with  improper specimen collection/handling, submission of specimen other than nasopharyngeal swab, presence of viral mutation(s) within the areas targeted by this assay, and inadequate number of viral copies (<131 copies/mL). A negative result must be combined with clinical observations, patient history, and epidemiological information. The expected result is Negative. Fact Sheet for Patients:  PinkCheek.be Fact Sheet for Healthcare Providers:  GravelBags.it This test is not yet ap proved or cleared by the [place] FDA and  has been authorized for detection and/or diagnosis of SARS-CoV-2 by FDA under an Emergency Use Authorization (EUA). This EUA will remain  in effect (meaning this test can be used) for the duration of the COVID-19 declaration under Section 564(b)(1) of the Act, 21  U.S.C. section 360bbb-3(b)(1), unless the authorization is terminated or revoked sooner.    Influenza A by PCR NEGATIVE NEGATIVE Final   Influenza B  by PCR NEGATIVE NEGATIVE Final    Comment: (NOTE) The Xpert Xpress SARS-CoV-2/FLU/RSV assay is intended as an aid in  the diagnosis of influenza from Nasopharyngeal swab specimens and  should not be used as a sole basis for treatment. Nasal washings and  aspirates are unacceptable for Xpert Xpress SARS-CoV-2/FLU/RSV  testing. Fact Sheet for Patients: PinkCheek.be Fact Sheet for Healthcare Providers: GravelBags.it This test is not yet approved or cleared by the [place] FDA and  has been authorized for detection and/or diagnosis of SARS-CoV-2 by  FDA under an Emergency Use Authorization (EUA). This EUA will remain  in effect (meaning this test can be used) for the duration of the  Covid-19 declaration under Section 564(b)(1) of the Act, 21  U.S.C. section 360bbb-3(b)(1), unless the authorization is  terminated or revoked. Performed at [facility], [place] [address], [place], [place] [address]   Respiratory Panel by RT PCR (Flu A&B, Covid) - Nasopharyngeal Swab     Status: None   Collection Time: 02/22/20  1:03 AM   Specimen: Nasopharyngeal Swab  Result Value Ref Range Status   SARS Coronavirus 2 by RT PCR NEGATIVE NEGATIVE Final    Comment: (NOTE) SARS-CoV-2 target nucleic acids are NOT DETECTED. The SARS-CoV-2 RNA is generally detectable in upper respiratoy specimens during the acute phase of infection. The lowest concentration of SARS-CoV-2 viral copies this assay can detect is 131 copies/mL. A negative result does not preclude SARS-Cov-2 infection and should not be used as the sole basis for treatment or other patient management decisions. A negative result may occur with  improper specimen collection/handling, submission of specimen other than nasopharyngeal  swab, presence of viral mutation(s) within the areas targeted by this assay, and inadequate number of viral copies (<131 copies/mL). A negative result must be combined with clinical observations, patient history, and epidemiological information. The expected result is Negative. Fact Sheet for Patients:  PinkCheek.be Fact Sheet for Healthcare Providers:  GravelBags.it This test is not yet ap proved or cleared by the [place] FDA and  has been authorized for detection and/or diagnosis of SARS-CoV-2 by FDA under an Emergency Use Authorization (EUA). This EUA will remain  in effect (meaning this test can be used) for the duration of the COVID-19 declaration under Section 564(b)(1) of the Act, 21 U.S.C. section 360bbb-3(b)(1), unless the authorization is terminated or revoked sooner.    Influenza A by PCR NEGATIVE NEGATIVE Final   Influenza B by PCR NEGATIVE NEGATIVE Final    Comment: (NOTE) The Xpert Xpress SARS-CoV-2/FLU/RSV assay is intended as an aid in  the diagnosis of influenza from Nasopharyngeal swab specimens and  should not be used as a sole basis for treatment. Nasal washings and  aspirates are unacceptable for Xpert Xpress SARS-CoV-2/FLU/RSV  testing. Fact Sheet for Patients: PinkCheek.be Fact Sheet for Healthcare Providers: GravelBags.it This test is not yet approved or cleared by the [place] FDA and  has been authorized for detection and/or diagnosis of SARS-CoV-2 by  FDA under an Emergency Use Authorization (EUA). This EUA will remain  in effect (meaning this test can be used) for the duration of the  Covid-19 declaration under Section 564(b)(1) of the Act, 21  U.S.C. section 360bbb-3(b)(1), unless the authorization is  terminated or revoked. Performed at [facility], [place] [address], [place], [place] [address]     Procedures and  diagnostic studies:  CT Angio Abd/Pel W and/or Wo Contrast  Result Date: 02/21/2020 CLINICAL DATA:  Upper abdominal pain  and 30 pound weight loss EXAM: CTA ABDOMEN AND PELVIS WITHOUT AND WITH CONTRAST TECHNIQUE: Multidetector CT imaging of the abdomen and pelvis was performed using the standard protocol during bolus administration of intravenous contrast. Multiplanar reconstructed images and MIPs were obtained and reviewed to evaluate the vascular anatomy. CONTRAST:  121mL OMNIPAQUE IOHEXOL 350 MG/ML SOLN COMPARISON:  February 14, 2020, MRI abdomen December 12, 2018 FINDINGS: CTA ABDOMEN AND PELVIS FINDINGS VASCULAR Aorta: Normal caliber aorta without aneurysm, dissection, vasculitis or hemodynamically significant stenosis. There is also scattered aortic atherosclerosis. Celiac: No aneurysm, dissection or hemodynamically significant stenosis. Normal branching pattern SMA: Widely patent without dissection or stenosis. Renals: Single renal arteries bilaterally. Mild stenosis at the origin of the renal arteries due to atherosclerosis. No fibromuscular dysplasia or aneurysm. IMA: Scattered atherosclerosis at the origin of the bilateral iliacs without stenosis. Inflow: No aneurysm, stenosis or dissection. Veins: Normal course and caliber of the major veins. Review of the MIP images confirms the above findings. NON-VASCULAR Hepatobiliary: Normal hepatic contours and density. No visible biliary dilatation. Normal gallbladder. Pancreas: Normal contours without ductal dilatation. No peripancreatic fluid collection. Spleen: Normal arterial phase splenic enhancement pattern. Adrenals/Urinary Tract: --Adrenal glands: Normal. --Right kidney/ureter: No hydronephrosis or perinephric stranding. No nephrolithiasis. No obstructing ureteral stones. --Left kidney/ureter: As on the prior examinations dating back to the MRI abdomen heterogeneously hyperdense mass seen partially exophytic off the lower pole of the left kidney measuring  4.2 x 4.1 cm which was thought to be a hemorrhagic cyst. --Urinary bladder: Mildly distended and fluid-filled. Stomach/Bowel: --Stomach/Duodenum: No hiatal hernia or other gastric abnormality. Normal duodenal course and caliber. --Small bowel: No dilatation or inflammation. --Colon: There is a moderate to large amount of colonic stool seen within the right colon and cecum. Within the sigmoid rectal junction there appears to be question of mild wall thickening. Lymphatic:  No abdominal or pelvic lymphadenopathy. Reproductive: No free fluid in the pelvis. Somewhat limited visualization of the uterus, which appears mildly prominent. Musculoskeletal. No bony spinal canal stenosis or focal osseous abnormality. There is diffuse osteopenia. Degenerative changes seen in the thoracolumbar spine. Other: None. Review of the MIP images confirms the above findings. IMPRESSION: VASCULAR No acute vascular abnormality. Aortic Atherosclerosis (ICD10-I70.0). NON-VASCULAR Stable exophytic left renal probable hemorrhagic cyst measuring 4.2 x 4.1 cm. Question of mild wall thickening seen at the sigmoid rectal junction. If further evaluation is required would recommend direct visualization with colonoscopy. Diffuse mild anasarca. Mildly prominent partially visualized uterus. Electronically Signed   By: Prudencio Pair M.D.   On: 02/21/2020 22:53    Medications:   . aspirin EC  81 mg Oral Daily  . atorvastatin  80 mg Oral Daily  . carvedilol  25 mg Oral BID WC  . enoxaparin (LOVENOX) injection  30 mg Subcutaneous Q24H  . ezetimibe  10 mg Oral QHS  . insulin aspart  0-5 Units Subcutaneous QHS  . insulin aspart  0-9 Units Subcutaneous TID WC  . insulin aspart  5 Units Subcutaneous TID WC  . insulin glargine  10 Units Subcutaneous QHS  . isosorbide mononitrate  60 mg Oral Daily  . lisinopril  40 mg Oral Daily  . pantoprazole (PROTONIX) IV  40 mg Intravenous Q24H  . sodium chloride flush  10-40 mL Intracatheter Q12H  .  spironolactone  25 mg Oral Daily   Continuous Infusions:   LOS: 1 day   Geradine Girt  [facility]   How to contact the [facility] Attending or Consulting provider 7A -  7P or covering provider during after hours [place], for this patient?  1. Check the care team in [facility] and look for a) attending/consulting [facility] provider listed and b) the [facility] team listed 2. Log into [email or web address] and use [facility]'s universal password to access. If you do not have the password, please contact the hospital operator. 3. Locate the [facility] provider you are looking for under [facility] and page to a number that you can be directly reached. 4. If you still have difficulty reaching the provider, please page the [facility] (Director on Call) for the Hospitalists listed on amion for assistance.  02/22/2020, 1:47 PM

## 2020-02-23 ENCOUNTER — Telehealth: Payer: Self-pay | Admitting: Internal Medicine

## 2020-02-23 ENCOUNTER — Encounter: Payer: Self-pay | Admitting: *Deleted

## 2020-02-23 LAB — GLUCOSE, CAPILLARY
Glucose-Capillary: 156 mg/dL — ABNORMAL HIGH (ref 70–99)
Glucose-Capillary: 162 mg/dL — ABNORMAL HIGH (ref 70–99)
Glucose-Capillary: 246 mg/dL — ABNORMAL HIGH (ref 70–99)
Glucose-Capillary: 270 mg/dL — ABNORMAL HIGH (ref 70–99)
Glucose-Capillary: 340 mg/dL — ABNORMAL HIGH (ref 70–99)

## 2020-02-23 LAB — COMPREHENSIVE METABOLIC PANEL
ALT: 22 U/L (ref 0–44)
AST: 23 U/L (ref 15–41)
Albumin: 2.4 g/dL — ABNORMAL LOW (ref 3.5–5.0)
Alkaline Phosphatase: 65 U/L (ref 38–126)
Anion gap: 7 (ref 5–15)
BUN: 24 mg/dL — ABNORMAL HIGH (ref 8–23)
CO2: 28 mmol/L (ref 22–32)
Calcium: 8.4 mg/dL — ABNORMAL LOW (ref 8.9–10.3)
Chloride: 96 mmol/L — ABNORMAL LOW (ref 98–111)
Creatinine, Ser: 0.8 mg/dL (ref 0.44–1.00)
GFR calc Af Amer: >60 mL/min (ref >60)
GFR calc non Af Amer: >60 mL/min (ref >60)
Glucose, Bld: 192 mg/dL — ABNORMAL HIGH (ref 70–99)
Potassium: 4 mmol/L (ref 3.5–5.1)
Sodium: 131 mmol/L — ABNORMAL LOW (ref 135–145)
Total Bilirubin: 0.9 mg/dL (ref 0.3–1.2)
Total Protein: 4.4 g/dL — ABNORMAL LOW (ref 6.5–8.1)

## 2020-02-23 LAB — CBC
HCT: 38.2 % (ref 36.0–46.0)
Hemoglobin: 12.8 g/dL (ref 12.0–15.0)
MCH: 28.8 pg (ref 26.0–34.0)
MCHC: 33.5 g/dL (ref 30.0–36.0)
MCV: 86 fL (ref 80.0–100.0)
Platelets: 182 10*3/uL (ref 150–400)
RBC: 4.44 MIL/uL (ref 3.87–5.11)
RDW: 13.2 % (ref 11.5–15.5)
WBC: 5.4 10*3/uL (ref 4.0–10.5)
nRBC: 0 % (ref 0.0–0.2)

## 2020-02-23 LAB — URINALYSIS, ROUTINE W REFLEX MICROSCOPIC
Bilirubin Urine: NEGATIVE
Glucose, UA: >=500 mg/dL — AB
Ketones, ur: NEGATIVE mg/dL
Nitrite: POSITIVE — AB
Protein, ur: 100 mg/dL — AB
Specific Gravity, Urine: 1.007 (ref 1.005–1.030)
pH: 6 (ref 5.0–8.0)

## 2020-02-23 LAB — SURGICAL PATHOLOGY

## 2020-02-23 MED ORDER — CARVEDILOL 12.5 MG PO TABS
12.5000 mg | ORAL_TABLET | Freq: Two times a day (BID) | ORAL | Status: DC
  Administered 2020-02-23 – 2020-02-26 (×6): 12.5 mg via ORAL
  Filled 2020-02-23 (×6): qty 1

## 2020-02-23 MED ORDER — PANTOPRAZOLE SODIUM 40 MG PO TBEC
40.0000 mg | DELAYED_RELEASE_TABLET | Freq: Two times a day (BID) | ORAL | Status: DC
  Administered 2020-02-23 – 2020-02-26 (×7): 40 mg via ORAL
  Filled 2020-02-23 (×7): qty 1

## 2020-02-23 MED ORDER — ENSURE ENLIVE PO LIQD
237.0000 mL | Freq: Three times a day (TID) | ORAL | Status: DC
  Administered 2020-02-23 – 2020-02-25 (×8): 237 mL via ORAL
  Filled 2020-02-23: qty 237

## 2020-02-23 NOTE — TOC Initial Note (Addendum)
Transition of Care [facility]) - Initial/Assessment Note    Patient Details  Name: Marisa Forbes MRN: [id number] Date of Birth: 06-24-1951  Transition of Care [facility]) CM/SW Contact:    Marilu Favre, RN Phone Number: 02/23/2020, 12:57 PM  Clinical Narrative:                  Patient from home with husband. Has [name] [place]. Spoke to Edmund with [name] will need resumption of care orders. Received orders   Confirmed face sheet information. Patient has PCP , has transportation to appointments and can afford prescriptions.  Patient has walker , bedside commode and shower chair already at home .   Expected Discharge Plan: [place] Barriers to Discharge: Continued Medical Work up   Patient Goals and CMS Choice Patient states their goals for this hospitalization and ongoing recovery are:: to return to home CMS Medicare.gov Compare Post Acute Care list provided to:: Patient Choice offered to / list presented to : Patient  Expected Discharge Plan and Services Expected Discharge Plan: [place] Choice: [place] arrangements for the past 2 months: Single Family Home                 DME Arranged: N/A DME Agency: NA       HH Arranged: PT, RN [place] Agency: [name] [employer] [facility] [employer] Date [place]: 02/23/20 Time [place]: 1257 Representative spoke with at [place] will need resumption of care orders  Prior Living Arrangements/Services Living arrangements for the past 2 months: [place] with:: Spouse Patient language and need for interpreter reviewed:: Yes Do you feel safe going back to the place where you live?: Yes      Need for Family Participation in Patient Care: Yes (Comment) Care giver support system in place?: Yes (comment) Current home services: DME, Home PT, Home RN Criminal Activity/Legal Involvement  Pertinent to Current Situation/Hospitalization: No - Comment as needed  Activities of Daily Living Home Assistive Devices/Equipment: Blood pressure cuff, Cane (specify quad or straight), CBG Meter, Grab bars in shower, Hand-held shower hose, Raised toilet seat with rails, Scales, Reacher, Shower chair with back ADL Screening (condition at time of admission) Patient's cognitive ability adequate to safely complete daily activities?: Yes Is the patient deaf or have difficulty hearing?: No Does the patient have difficulty seeing, even when wearing glasses/contacts?: No Does the patient have difficulty concentrating, remembering, or making decisions?: No Patient able to express need for assistance with ADLs?: Yes Does the patient have difficulty dressing or bathing?: Yes Independently performs ADLs?: No Communication: Independent Dressing (OT): Needs assistance Is this a change from baseline?: Pre-admission baseline Grooming: Independent Feeding: Independent Bathing: Needs assistance Is this a change from baseline?: Pre-admission baseline Toileting: Needs assistance Is this a change from baseline?: Pre-admission baseline In/Out Bed: Needs assistance Is this a change from baseline?: Pre-admission baseline Walks in Home: Needs assistance Is this a change from baseline?: Pre-admission baseline Weakness of Legs: Both Weakness of Arms/Hands: Both  Permission Sought/Granted   Permission granted to share information with : No              Emotional Assessment Appearance:: Appears stated age Attitude/Demeanor/Rapport: Engaged Affect (typically observed): Accepting Orientation: : Oriented to Self, Oriented to Place, Oriented to  Time, Oriented to Situation Alcohol / Substance Use: Not Applicable Psych Involvement:  No (comment)  Admission diagnosis:  Epigastric pain [R10.13] Intractable vomiting with nausea [R11.2] Nausea and vomiting, intractability of vomiting not specified,  unspecified vomiting type [R11.2] Patient Active Problem List   Diagnosis Date Noted  . Pressure injury of skin 02/22/2020  . Intractable vomiting with nausea 02/21/2020  . Impaction of colon ([place])   . Hypotension 02/19/2020  . Near syncope 02/19/2020  . Nausea and vomiting 02/14/2020  . Hypertensive emergency 02/14/2020  . Hypertensive encephalopathy 02/14/2020  . Hyperglycemia due to type 2 diabetes mellitus ([place]) 02/14/2020  . Weakness 01/20/2020  . Cerebrovascular accident (CVA) ([place]) 01/20/2020  . Gait abnormality 01/20/2020  . Mobility impaired 01/07/2020  . Sinusitis 12/28/2019  . Decreased activities of daily living (ADL) 12/05/2019  . Physical deconditioning 12/05/2019  . Impaired instrumental activities of daily living (IADL) 12/05/2019  . Abnormal gait 12/05/2019  . Insomnia 12/25/2018  . Peri-rectal abscess 12/12/2018  . Perirectal abscess 12/11/2018  . Left renal mass 12/11/2018  . Pleural effusion 12/11/2018  . Lower urinary tract infectious disease   . Proctitis   . Unintentional weight loss   . Chronic combined systolic (congestive) and diastolic (congestive) heart failure ([place]) 01/22/2018  . Elevated troponin 12/22/2016  . Chest pain, rule out acute myocardial infarction 07/29/2015  . 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 Poorly controlled type 2 diabetes mellitus with circulatory disorder ([place])   . Ischemic cardiomyopathy   . Congestive dilated cardiomyopathy ([place]) 04/13/2015  . 3rd cranial nerve palsy   . Diplopia   . CVA (cerebral infarction) 04/11/2015  . Renal artery stenosis ([place])   . LV dysfunction   . Benign paroxysmal positional vertigo 05/19/2013  . Cancer of upper-inner quadrant of female breast ([place]) 12/06/2012  . Cervical dystonia 10/18/2012  . Peripheral neuropathy 10/18/2012  . Other screening mammogram 10/03/2012  . History of completed stroke 09/12/2012    Class: Acute  . Hyperlipidemia with target LDL less than 70 09/09/2012  . CAD  (coronary artery disease) 09/09/2012  . NSVT, 5 beats 11/10 09/09/2012  . Hypertensive heart disease with CHF (congestive heart failure) ([place]) 09/07/2012   PCP:  McLean-Scocuzza, Nino Glow, MD Pharmacy:   [id number] - [place], [address] [place] [address] Phone: [phone] Fax: [phone]     Social Determinants of Health (SDOH) Interventions    Readmission Risk Interventions No flowsheet data found.

## 2020-02-23 NOTE — Evaluation (Signed)
Physical Therapy Evaluation Patient Details Name: Marisa Forbes MRN: [id number] DOB: 1951-06-20 Today's Date: 02/23/2020   History of Present Illness  Pt is a 69 y/o female admitted secondary to intractable nausea and vomiting. possibly worsening gastritis/esophagitis.  Recent admission for syncope and nausea. PMH includes HTN, DM, CHF, CVA, CAD, and breast cancer.   Clinical Impression  [name] Only able to tolerate short distance ambulation this session secondary to fatigue and weakness. Required min to min guard A for mobility tasks using RW. Pt reports she plans to return home with assist from her husband. Will continue to follow acutely to maximize functional mobility independence and safety.     Follow Up Recommendations Home health PT;Supervision/Assistance - 24 hour    Equipment Recommendations  None recommended by PT    Recommendations for Other Services       Precautions / Restrictions Precautions Precautions: Fall Restrictions Weight Bearing Restrictions: No      Mobility  Bed Mobility Overal bed mobility: Needs Assistance Bed Mobility: Supine to Sit;Sit to Supine     Supine to sit: Supervision;HOB elevated Sit to supine: Min assist   General bed mobility comments: Supervision and use of bedrails to sit at EOB. Min A for LE assist to return to supine.   Transfers Overall transfer level: Needs assistance Equipment used: Rolling walker (2 wheeled) Transfers: Sit to/from Stand Sit to Stand: Min assist         General transfer comment: Min A For lift assist and steadying.   Ambulation/Gait Ambulation/Gait assistance: Min guard Gait Distance (Feet): 5 Feet Assistive device: Rolling walker (2 wheeled) Gait Pattern/deviations: Step-through pattern;Decreased stride length;Trunk flexed Gait velocity: Decreased   General Gait Details: Very slow, guarded gait. Pt with increased weakness and only able to  tolerate very short distance within the room using RW.   Stairs            Wheelchair Mobility    Modified Rankin (Stroke Patients Only)       Balance Overall balance assessment: Needs assistance Sitting-balance support: No upper extremity supported;Feet supported Sitting balance-Leahy Scale: Fair     Standing balance support: Bilateral upper extremity supported;During functional activity Standing balance-Leahy Scale: Poor Standing balance comment: Reliant on BUE support                              Pertinent Vitals/Pain Pain Assessment: No/denies pain    Home Living Family/patient expects to be discharged to:: Private residence Living Arrangements: Spouse/significant other Available Help at Discharge: Family;Available 24 hours/day Type of Home: House Home Access: Stairs to enter Entrance Stairs-Rails: Right Entrance Stairs-Number of Steps: 3 Home Layout: One level Home Equipment: Bedside commode;Shower seat;Cane - single point;Wheelchair - [employer] - 2 wheels;Walker - 4 wheels;Other (comment)(lift chair)      Prior Function Level of Independence: Needs assistance   Gait / Transfers Assistance Needed: Was using RW since dc from hospital   ADL's / Homemaking Assistance Needed: Help with transfers into tub and husband helps with bathing.         Hand Dominance        Extremity/Trunk Assessment   Upper Extremity Assessment Upper Extremity Assessment: Defer to OT evaluation    Lower Extremity Assessment Lower Extremity Assessment: Generalized weakness    Cervical / Trunk Assessment Cervical / Trunk Assessment: Kyphotic  Communication   Communication: No difficulties  Cognition Arousal/Alertness: Awake/alert Behavior During  Therapy: WFL for tasks assessed/performed Overall Cognitive Status: Within Functional Limits for tasks assessed                                        General Comments      Exercises      Assessment/Plan    PT Assessment Patient needs continued PT services  PT Problem List Decreased strength;Decreased balance;Decreased activity tolerance;Decreased mobility;Decreased knowledge of use of DME       PT Treatment Interventions DME instruction;Gait training;Stair training;Functional mobility training;Therapeutic activities;Therapeutic exercise;Balance training;Patient/family education    PT Goals (Current goals can be found in the Care Plan section)  Acute Rehab PT Goals Patient Stated Goal: to feel better and go home  PT Goal Formulation: With patient Time For Goal Achievement: 03/08/20 Potential to Achieve Goals: Good    Frequency Min 3X/week   Barriers to discharge        Co-evaluation               AM-PAC PT "6 Clicks" Mobility  Outcome Measure Help needed turning from your back to your side while in a flat bed without using bedrails?: A Little Help needed moving from lying on your back to sitting on the side of a flat bed without using bedrails?: A Little Help needed moving to and from a bed to a chair (including a wheelchair)?: A Little Help needed standing up from a chair using your arms (e.g., wheelchair or bedside chair)?: A Little Help needed to walk in hospital room?: A Little Help needed climbing 3-5 steps with a railing? : A Lot 6 Click Score: 17    End of Session Equipment Utilized During Treatment: Gait belt Activity Tolerance: Patient limited by fatigue Patient left: in bed;with call bell/phone within reach;with bed alarm set Nurse Communication: Mobility status PT Visit Diagnosis: Unsteadiness on feet (R26.81);Muscle weakness (generalized) (M62.81)    Time: [id number] PT Time Calculation (min) (ACUTE ONLY): 18 min   Charges:   PT Evaluation $PT Eval Moderate Complexity: 1 Mod          Reuel Derby, PT, DPT  Acute Rehabilitation Services  Pager: [phone] Office: [phone]   Rudean Hitt 02/23/2020, 6:12  PM

## 2020-02-23 NOTE — Telephone Encounter (Signed)
Marisa Forbes with [name] called to report a missed OT/PT from 4/24 patient is at admitted to the [facility]

## 2020-02-23 NOTE — Progress Notes (Signed)
Inpatient Diabetes Program Recommendations  AACE/ADA: New Consensus Statement on Inpatient Glycemic Control (2015)  Target Ranges:  Prepandial:   less than 140 mg/dL      Peak postprandial:   less than 180 mg/dL (1-2 hours)      Critically ill patients:  140 - 180 mg/dL   Lab Results  Component Value Date   GLUCAP 246 (H) 02/23/2020   HGBA1C 15.0 (H) 02/16/2020    Review of Glycemic Control Results for Marisa Forbes, Marisa Forbes (MRN [id number]) as of 02/23/2020 15:37  Ref. Range 02/23/2020 05:55 02/23/2020 11:27 02/23/2020 15:30  Glucose-Capillary Latest Ref Range: 70 - 99 mg/dL 156 (H) 162 (H) 246 (H)   Diabetes history: DM 2 Outpatient Diabetes medications: NPH 15 units bid, Novolin R 15 units tid with meals Current orders for Inpatient glycemic control:  Novolog sensitive tid with meals and HS Novolog 5 units tid with meals Lantus 10 units q HS  Inpatient Diabetes Program Recommendations:    Agree with current orders.  Diabetes Coordinator spoke with patient during last admission on 02/20/20 regarding A1C and home medications.  Will follow.   Thanks  Adah Perl, RN, BC-ADM Inpatient Diabetes Coordinator Pager [phone] (8a-5p)

## 2020-02-23 NOTE — Progress Notes (Signed)
Initial Nutrition Assessment  DOCUMENTATION CODES:   Severe malnutrition in context of chronic illness, Underweight  INTERVENTION:   - Ensure Enlive po TID, each supplement provides 350 kcal and 20 grams of protein  - Encourage adequate PO intake  NUTRITION DIAGNOSIS:   Severe Malnutrition related to chronic illness (CHF) as evidenced by moderate fat depletion, severe fat depletion, moderate muscle depletion, severe muscle depletion.  GOAL:   Patient will meet greater than or equal to 90% of their needs  MONITOR:   PO intake, Supplement acceptance, Labs, Weight trends, Skin  REASON FOR ASSESSMENT:   Malnutrition Screening Tool    ASSESSMENT:   69 year old female who presented on 4/24 with intractable abdominal pain, N/V. PMH of HTN, T2DM, CHF, CVA, CAD, HLD, breast cancer.   No meal completions recorded at this time.  Spoke with pt at bedside. Pt reports that her appetite is improving. Pt states that she ate well at lunch and does not feel any nausea at this time.  Pt reports that she was experiencing 1 week of N/V PTA and wasn't able to keep down solids or liquids during this time.  Pt reports that when she is feeling well, she has a good appetite and eats 2-3 meals daily and may snack between meals.  Breakfast: oatmeal or grits with scrambled eggs and toast Lunch: sandwich Dinner: [employer]  When asked about her weight loss, pt attributes it to not feeling well over the last week and fluid loss but also reports that she used to weigh 130 lbs about 5 years ago. Pt unable to identify why she has been losing weight over time. Pt reports her current UBW is 110-120 lbs.  Reviewed weight history in chart. Pt with a weight loss of 6.2 kg since 11/04/19. However, pt's weight has fluctuated between 46-64 kg during that timeframe. It is difficult to determine whether weight loss is related more to fluid status vs true weight loss. Regardless, a 6.2 kg weight loss  represents a 9.6% weight loss in less than 4 months which is significant for timeframe.  Pt is willing to consume an oral nutrition supplement during admission. Pt with severe malnutrition and would benefit from nutrient dense supplement. One Ensure Enlive supplement provides 350 kcals, 20 grams protein, and 44-45 grams of carbohydrate vs one Glucerna shake supplement, which provides 220 kcals, 10 grams of protein, and 26 grams of carbohydrate. Given pt's hx of DM, RD will continue to monitor PO intake, CBGS, and adjust supplement regimen as appropriate.   Medications reviewed and include: SSI, Novolog 5 units TID with meals, Lantus 10 units daily, protonix  Labs reviewed: sodium 131, chloride 96 CBG's: 156-234 x 24 hours  NUTRITION - FOCUSED PHYSICAL EXAM:    Most Recent Value  Orbital Region  Moderate depletion  Upper Arm Region  Severe depletion  Thoracic and Lumbar Region  Severe depletion  Buccal Region  Moderate depletion  Temple Region  Severe depletion  Clavicle Bone Region  Severe depletion  Clavicle and Acromion Bone Region  Severe depletion  Scapular Bone Region  Severe depletion  Dorsal Hand  Moderate depletion  Patellar Region  Severe depletion  Anterior Thigh Region  Severe depletion  Posterior Calf Region  Severe depletion  Edema (RD Assessment)  None  Hair  Reviewed  Eyes  Reviewed  Mouth  Reviewed  Skin  Reviewed  Nails  Reviewed       Diet Order:   Diet Order  DIET SOFT Room service appropriate? Yes; Fluid consistency: Thin  Diet effective now              EDUCATION NEEDS:   Education needs have been addressed  Skin:  Skin Assessment: Skin Integrity Issues: Stage I: coccyx  Last BM:  02/22/20  Height:   Ht Readings from Last 1 Encounters:  02/21/20 5\' 5"  (1.651 m)    Weight:   Wt Readings from Last 1 Encounters:  02/22/20 46 kg    Ideal Body Weight:  56.8 kg  BMI:  Body mass index is 16.88 kg/m.  Estimated Nutritional  Needs:   Kcal:  1500-1700  Protein:  70-85 grams  Fluid:  >/= 1.5 L    Gaynell Face, MS, RD, LDN Inpatient Clinical Dietitian Pager: [phone] Weekend/After Hours: [phone]

## 2020-02-23 NOTE — Telephone Encounter (Signed)
Pt admitted for N/V and abdominal pain  Looks like she is dry   ON lisinopril on admit

## 2020-02-23 NOTE — Telephone Encounter (Signed)
Called patient to arrange for her to come to ov on 02/26/20.   Per her husband patient is admitted to [facility].  Will forward to Dr. Harrington Challenger for review as Juluis Rainier.

## 2020-02-23 NOTE — Progress Notes (Addendum)
Progress Note    Marisa Forbes  [id number] DOB: [birth year]  DOA: 02/21/2020 PCP: McLean-Scocuzza, Nino Glow, MD    Brief Narrative:     Medical records reviewed and are as summarized below:  Marisa Forbes is an 69 y.o. female  with medical history significant for Hx of HTN, uncontrolled insulin-dependent Type 2 diabetes, combined CHF, CVA, CAD, HLD and hx of breast cancer who presents with intractable abdominal pain, nausea and vomiting.   She was just at [facility] and observed overnight on 4/22 for syncope due to hypotension and  abdominal pain and decrease PO intake. Upon returning home she tried to eat but then started to vomiting all her food. Emesis was non-bloody but somewhat bilious. She also noted constant dull epigastric pain that was 8/10 at its worse. Now resolved in the ED without any intervention.    Assessment/Plan:   Active Problems:   CAD (coronary artery disease)   Poorly controlled type 2 diabetes mellitus with circulatory disorder (HCC)   Chronic combined systolic (congestive) and diastolic (congestive) heart failure (HCC)   Hyperglycemia due to type 2 diabetes mellitus (HCC)   Intractable vomiting with nausea   Pressure injury of skin   abdominal pain/esophagitis/gastritis  -Multiple hospitalizations/ER visits in the last month possibly worsening gastritis/esophagitis but there is concerns for malignancy on CT abdomen scan on 4/17. Unfortunately colonoscopy was unsuccessful at last admission due to inadequate bowel prep CTA done in the ER last night: Moderate to large amount of stool in the right colon and cecum -will start bowel clean out -continue PPI BID -Not having any current abdominal pain now -We will also rule out UTI with urinalysis -will advance diet as patient not having any pain/pressure -Patient already has an appointment with Dr. Lyndel Safe on May 18-if patient does not progress will reconsult for possible colonoscopy  Chronic combined  CHF  appears dry on exam Continue Coreg  Insulin dependent Type 2 diabetes Lantus 10 units, Novolog 5 units TID with sliding scale  -Hemoglobin A1c of fifteen 1 week ago -needs diet education  CAD no issues. stable continue Imdur, statin and Zetia  Hypokalemia -Replete  Hypertension Hold Lasix per recommend from recent admission for hypotension continue lisinopril but at a lower dose for hypotension -Hold spironolactone   Pressure injury- POA Pressure Injury 02/22/20 Coccyx Posterior;Mid Stage 1 -  Intact skin with non-blanchable redness of a localized area usually over a bony prominence. (Active)  02/22/20 0309  Location: Coccyx  Location Orientation: Posterior;Mid  Staging: Stage 1 -  Intact skin with non-blanchable redness of a localized area usually over a bony prominence.  Wound Description (Comments):   Present on Admission: Yes       Family Communication/Anticipated D/C date and plan/Code Status   DVT prophylaxis: Lovenox ordered. Code Status: DNR Family Communication: spoke with daughter 4/25 Disposition Plan: Status is: Inpatient  Remains inpatient appropriate because:Inpatient level of care appropriate due to severity of illness   Dispo: The patient is from: Home              Anticipated d/c is to: Home              Anticipated d/c date is: 2 days              Patient currently is not medically stable to d/c.          Medical Consultants:    None.     Subjective:    Asking for  more food as she is hungry  Objective:    Vitals:   02/22/20 1248 02/22/20 1824 02/23/20 0006 02/23/20 0559  BP: 108/82 104/69 103/70 118/68  Pulse:  68 63 65  Resp:  18 18 18   Temp:  98.6 F (37 C) 97.7 F (36.5 C) 98.1 F (36.7 C)  TempSrc:  Axillary Oral Oral  SpO2:  98% 97% 97%  Weight:      Height:        Intake/Output Summary (Last 24 hours) at 02/23/2020 0840 Last data filed at 02/23/2020 0330 Gross per 24 hour  Intake 240 ml  Output --   Net 240 ml   Filed Weights   02/21/20 1214 02/22/20 0600  Weight: 45.4 kg 46 kg    Exam: General: Appearance:    Thin chronically ill appeaing female in no acute distress  Lungs:     Clear to auscultation bilaterally, respirations unlabored  Heart:    Normal heart rate. Normal rhythm. No murmurs, rubs, or gallops.   MS:   All extremities are intact.   Neurologic:   Awake, alert, oriented x 3. Mild tremor      Data Reviewed:   I have personally reviewed following labs and imaging studies:  Labs: Labs show the following:   Basic Metabolic Panel: Recent Labs  Lab 02/19/20 1100 02/19/20 1100 02/20/20 0555 02/20/20 0555 02/21/20 1927 02/21/20 1927 02/22/20 0419 02/23/20 0329  NA 133*  --  136  --  133*  --  135 131*  K 3.2*   < > 3.0*   < > 3.8   < > 3.2* 4.0  CL 89*  --  95*  --  92*  --  97* 96*  CO2 30  --  28  --  29  --  29 28  GLUCOSE 415*  --  91  --  240*  --  209* 192*  BUN 50*  --  45*  --  33*  --  29* 24*  CREATININE 1.17*  --  0.84  --  0.98  --  0.81 0.80  CALCIUM 9.5  --  9.6  --  9.7  --  8.8* 8.4*   < > = values in this interval not displayed.   GFR Estimated Creatinine Clearance: 48.2 mL/min (by C-G formula based on SCr of 0.8 mg/dL). Liver Function Tests: Recent Labs  Lab 02/19/20 1644 02/21/20 1927 02/23/20 0329  AST 15 30 23   ALT 13 24 22   ALKPHOS 53 93 65  BILITOT 0.9 1.6* 0.9  PROT 3.8* 5.9* 4.4*  ALBUMIN 2.1* 3.3* 2.4*   Recent Labs  Lab 02/19/20 1644 02/21/20 1927  LIPASE 61* 67*   No results for input(s): AMMONIA in the last 168 hours. Coagulation profile No results for input(s): INR, PROTIME in the last 168 hours.  CBC: Recent Labs  Lab 02/19/20 1100 02/20/20 0555 02/21/20 1927 02/22/20 0419 02/23/20 0329  WBC 5.7 9.1 7.8 5.4 5.4  NEUTROABS 4.1 6.4  --   --   --   HGB 15.2* 16.3* 16.6* 13.5 12.8  HCT 45.6 47.6* 49.6* 40.4 38.2  MCV 86.2 86.1 86.9 86.5 86.0  PLT 257 259 243 195 182   Cardiac Enzymes: No  results for input(s): CKTOTAL, CKMB, CKMBINDEX, TROPONINI in the last 168 hours. BNP (last 3 results) Recent Labs    01/14/20 1103 02/02/20 1356 02/11/20 1258  PROBNP 5,532* 5,273* 1,733*   CBG: Recent Labs  Lab 02/22/20 0617 02/22/20 1147 02/22/20 1622 02/22/20  2158 02/23/20 0555  GLUCAP 161* 124* 234* 189* 156*   D-Dimer: No results for input(s): DDIMER in the last 72 hours. Hgb A1c: No results for input(s): HGBA1C in the last 72 hours. Lipid Profile: No results for input(s): CHOL, HDL, LDLCALC, TRIG, CHOLHDL, LDLDIRECT in the last 72 hours. Thyroid function studies: No results for input(s): TSH, T4TOTAL, T3FREE, THYROIDAB in the last 72 hours.  Invalid input(s): FREET3 Anemia work up: No results for input(s): VITAMINB12, FOLATE, FERRITIN, TIBC, IRON, RETICCTPCT in the last 72 hours. Sepsis Labs: Recent Labs  Lab 02/20/20 0555 02/21/20 1927 02/22/20 0419 02/23/20 0329  WBC 9.1 7.8 5.4 5.4  LATICACIDVEN  --  1.5 1.2  --     Microbiology Recent Results (from the past 240 hour(s))  SARS CORONAVIRUS 2 (TAT 6-24 HRS) Nasopharyngeal Nasopharyngeal Swab     Status: None   Collection Time: 02/14/20 11:14 PM   Specimen: Nasopharyngeal Swab  Result Value Ref Range Status   SARS Coronavirus 2 NEGATIVE NEGATIVE Final    Comment: (NOTE) SARS-CoV-2 target nucleic acids are NOT DETECTED. The SARS-CoV-2 RNA is generally detectable in upper and lower respiratory specimens during the acute phase of infection. Negative results do not preclude SARS-CoV-2 infection, do not rule out co-infections with other pathogens, and should not be used as the sole basis for treatment or other patient management decisions. Negative results must be combined with clinical observations, patient history, and epidemiological information. The expected result is Negative. Fact Sheet for Patients: SugarRoll.be Fact Sheet for Healthcare  Providers: https://www.woods-mathews.com/ This test is not yet approved or cleared by the [place] FDA and  has been authorized for detection and/or diagnosis of SARS-CoV-2 by FDA under an Emergency Use Authorization (EUA). This EUA will remain  in effect (meaning this test can be used) for the duration of the COVID-19 declaration under Section 56 4(b)(1) of the Act, 21 U.S.C. section 360bbb-3(b)(1), unless the authorization is terminated or revoked sooner. Performed at [facility], [place] [address], [place],  [address]   Respiratory Panel by RT PCR (Flu A&B, Covid) - Nasopharyngeal Swab     Status: None   Collection Time: 02/19/20  1:42 PM   Specimen: Nasopharyngeal Swab  Result Value Ref Range Status   SARS Coronavirus 2 by RT PCR NEGATIVE NEGATIVE Final    Comment: (NOTE) SARS-CoV-2 target nucleic acids are NOT DETECTED. The SARS-CoV-2 RNA is generally detectable in upper respiratoy specimens during the acute phase of infection. The lowest concentration of SARS-CoV-2 viral copies this assay can detect is 131 copies/mL. A negative result does not preclude SARS-Cov-2 infection and should not be used as the sole basis for treatment or other patient management decisions. A negative result may occur with  improper specimen collection/handling, submission of specimen other than nasopharyngeal swab, presence of viral mutation(s) within the areas targeted by this assay, and inadequate number of viral copies (<131 copies/mL). A negative result must be combined with clinical observations, patient history, and epidemiological information. The expected result is Negative. Fact Sheet for Patients:  PinkCheek.be Fact Sheet for Healthcare Providers:  GravelBags.it This test is not yet ap proved or cleared by the [place] FDA and  has been authorized for detection and/or diagnosis of SARS-CoV-2 by FDA  under an Emergency Use Authorization (EUA). This EUA will remain  in effect (meaning this test can be used) for the duration of the COVID-19 declaration under Section 564(b)(1) of the Act, 21 U.S.C. section 360bbb-3(b)(1), unless the authorization is terminated  or revoked sooner.    Influenza A by PCR NEGATIVE NEGATIVE Final   Influenza B by PCR NEGATIVE NEGATIVE Final    Comment: (NOTE) The Xpert Xpress SARS-CoV-2/FLU/RSV assay is intended as an aid in  the diagnosis of influenza from Nasopharyngeal swab specimens and  should not be used as a sole basis for treatment. Nasal washings and  aspirates are unacceptable for Xpert Xpress SARS-CoV-2/FLU/RSV  testing. Fact Sheet for Patients: PinkCheek.be Fact Sheet for Healthcare Providers: GravelBags.it This test is not yet approved or cleared by the [place] FDA and  has been authorized for detection and/or diagnosis of SARS-CoV-2 by  FDA under an Emergency Use Authorization (EUA). This EUA will remain  in effect (meaning this test can be used) for the duration of the  Covid-19 declaration under Section 564(b)(1) of the Act, 21  U.S.C. section 360bbb-3(b)(1), unless the authorization is  terminated or revoked. Performed at [facility], [place] [address] [place], [place] [address]   Respiratory Panel by RT PCR (Flu A&B, Covid) - Nasopharyngeal Swab     Status: None   Collection Time: 02/22/20  1:03 AM   Specimen: Nasopharyngeal Swab  Result Value Ref Range Status   SARS Coronavirus 2 by RT PCR NEGATIVE NEGATIVE Final    Comment: (NOTE) SARS-CoV-2 target nucleic acids are NOT DETECTED. The SARS-CoV-2 RNA is generally detectable in upper respiratoy specimens during the acute phase of infection. The lowest concentration of SARS-CoV-2 viral copies this assay can detect is 131 copies/mL. A negative result does not preclude SARS-Cov-2 infection and should not be used as the  sole basis for treatment or other patient management decisions. A negative result may occur with  improper specimen collection/handling, submission of specimen other than nasopharyngeal swab, presence of viral mutation(s) within the areas targeted by this assay, and inadequate number of viral copies (<131 copies/mL). A negative result must be combined with clinical observations, patient history, and epidemiological information. The expected result is Negative. Fact Sheet for Patients:  PinkCheek.be Fact Sheet for Healthcare Providers:  GravelBags.it This test is not yet ap proved or cleared by the [place] FDA and  has been authorized for detection and/or diagnosis of SARS-CoV-2 by FDA under an Emergency Use Authorization (EUA). This EUA will remain  in effect (meaning this test can be used) for the duration of the COVID-19 declaration under Section 564(b)(1) of the Act, 21 U.S.C. section 360bbb-3(b)(1), unless the authorization is terminated or revoked sooner.    Influenza A by PCR NEGATIVE NEGATIVE Final   Influenza B by PCR NEGATIVE NEGATIVE Final    Comment: (NOTE) The Xpert Xpress SARS-CoV-2/FLU/RSV assay is intended as an aid in  the diagnosis of influenza from Nasopharyngeal swab specimens and  should not be used as a sole basis for treatment. Nasal washings and  aspirates are unacceptable for Xpert Xpress SARS-CoV-2/FLU/RSV  testing. Fact Sheet for Patients: PinkCheek.be Fact Sheet for Healthcare Providers: GravelBags.it This test is not yet approved or cleared by the [place] FDA and  has been authorized for detection and/or diagnosis of SARS-CoV-2 by  FDA under an Emergency Use Authorization (EUA). This EUA will remain  in effect (meaning this test can be used) for the duration of the  Covid-19 declaration under Section 564(b)(1) of the Act, 21   U.S.C. section 360bbb-3(b)(1), unless the authorization is  terminated or revoked. Performed at [facility], [place] [address], [place], [place] [address]     Procedures and diagnostic studies:  CT Angio Abd/Pel W and/or Wo Contrast  Result Date: 02/21/2020 CLINICAL DATA:  Upper abdominal pain and 30 pound weight loss EXAM: CTA ABDOMEN AND PELVIS WITHOUT AND WITH CONTRAST TECHNIQUE: Multidetector CT imaging of the abdomen and pelvis was performed using the standard protocol during bolus administration of intravenous contrast. Multiplanar reconstructed images and MIPs were obtained and reviewed to evaluate the vascular anatomy. CONTRAST:  145mL OMNIPAQUE IOHEXOL 350 MG/ML SOLN COMPARISON:  February 14, 2020, MRI abdomen December 12, 2018 FINDINGS: CTA ABDOMEN AND PELVIS FINDINGS VASCULAR Aorta: Normal caliber aorta without aneurysm, dissection, vasculitis or hemodynamically significant stenosis. There is also scattered aortic atherosclerosis. Celiac: No aneurysm, dissection or hemodynamically significant stenosis. Normal branching pattern SMA: Widely patent without dissection or stenosis. Renals: Single renal arteries bilaterally. Mild stenosis at the origin of the renal arteries due to atherosclerosis. No fibromuscular dysplasia or aneurysm. IMA: Scattered atherosclerosis at the origin of the bilateral iliacs without stenosis. Inflow: No aneurysm, stenosis or dissection. Veins: Normal course and caliber of the major veins. Review of the MIP images confirms the above findings. NON-VASCULAR Hepatobiliary: Normal hepatic contours and density. No visible biliary dilatation. Normal gallbladder. Pancreas: Normal contours without ductal dilatation. No peripancreatic fluid collection. Spleen: Normal arterial phase splenic enhancement pattern. Adrenals/Urinary Tract: --Adrenal glands: Normal. --Right kidney/ureter: No hydronephrosis or perinephric stranding. No nephrolithiasis. No obstructing ureteral stones.  --Left kidney/ureter: As on the prior examinations dating back to the MRI abdomen heterogeneously hyperdense mass seen partially exophytic off the lower pole of the left kidney measuring 4.2 x 4.1 cm which was thought to be a hemorrhagic cyst. --Urinary bladder: Mildly distended and fluid-filled. Stomach/Bowel: --Stomach/Duodenum: No hiatal hernia or other gastric abnormality. Normal duodenal course and caliber. --Small bowel: No dilatation or inflammation. --Colon: There is a moderate to large amount of colonic stool seen within the right colon and cecum. Within the sigmoid rectal junction there appears to be question of mild wall thickening. Lymphatic:  No abdominal or pelvic lymphadenopathy. Reproductive: No free fluid in the pelvis. Somewhat limited visualization of the uterus, which appears mildly prominent. Musculoskeletal. No bony spinal canal stenosis or focal osseous abnormality. There is diffuse osteopenia. Degenerative changes seen in the thoracolumbar spine. Other: None. Review of the MIP images confirms the above findings. IMPRESSION: VASCULAR No acute vascular abnormality. Aortic Atherosclerosis (ICD10-I70.0). NON-VASCULAR Stable exophytic left renal probable hemorrhagic cyst measuring 4.2 x 4.1 cm. Question of mild wall thickening seen at the sigmoid rectal junction. If further evaluation is required would recommend direct visualization with colonoscopy. Diffuse mild anasarca. Mildly prominent partially visualized uterus. Electronically Signed   By: Prudencio Pair M.D.   On: 02/21/2020 22:53    Medications:   . aspirin EC  81 mg Oral Daily  . atorvastatin  80 mg Oral Daily  . carvedilol  25 mg Oral BID WC  . enoxaparin (LOVENOX) injection  30 mg Subcutaneous Q24H  . ezetimibe  10 mg Oral QHS  . insulin aspart  0-5 Units Subcutaneous QHS  . insulin aspart  0-9 Units Subcutaneous TID WC  . insulin aspart  5 Units Subcutaneous TID WC  . insulin glargine  10 Units Subcutaneous QHS  .  isosorbide mononitrate  60 mg Oral Daily  . lisinopril  20 mg Oral Daily  . pantoprazole (PROTONIX) IV  40 mg Intravenous Q24H  . sodium chloride flush  10-40 mL Intracatheter Q12H   Continuous Infusions:   LOS: 2 days   Geradine Girt  [facility]   How to  contact the [facility] Attending or Consulting provider [place] or covering provider during after hours [place], for this patient?  1. Check the care team in [facility] and look for a) attending/consulting [facility] provider listed and b) the [name] team listed 2. Log into [email or web address] and use [place]'s universal password to access. If you do not have the password, please contact the hospital operator. 3. Locate the [facility] provider you are looking for under [facility] and page to a number that you can be directly reached. 4. If you still have difficulty reaching the provider, please page the [facility] (Director on Call) for the Hospitalists listed on amion for assistance.  02/23/2020, 8:40 AM

## 2020-02-23 NOTE — Telephone Encounter (Signed)
Patient called because she states she received a call from the office. Advised her it was Dr. Harrington Challenger' nurse in regards to coming in for an appt on 02/26/20. Patient states she cannot due to being admitted, waiting on Dr. Harrington Challenger' recommendation per last telephone note.

## 2020-02-24 ENCOUNTER — Telehealth: Payer: Self-pay | Admitting: Internal Medicine

## 2020-02-24 DIAGNOSIS — K5900 Constipation, unspecified: Secondary | ICD-10-CM

## 2020-02-24 DIAGNOSIS — R933 Abnormal findings on diagnostic imaging of other parts of digestive tract: Secondary | ICD-10-CM | POA: Diagnosis not present

## 2020-02-24 DIAGNOSIS — E43 Unspecified severe protein-calorie malnutrition: Secondary | ICD-10-CM | POA: Insufficient documentation

## 2020-02-24 DIAGNOSIS — R112 Nausea with vomiting, unspecified: Secondary | ICD-10-CM

## 2020-02-24 DIAGNOSIS — N390 Urinary tract infection, site not specified: Secondary | ICD-10-CM

## 2020-02-24 LAB — BASIC METABOLIC PANEL
Anion gap: 8 (ref 5–15)
BUN: 23 mg/dL (ref 8–23)
CO2: 28 mmol/L (ref 22–32)
Calcium: 8.5 mg/dL — ABNORMAL LOW (ref 8.9–10.3)
Chloride: 95 mmol/L — ABNORMAL LOW (ref 98–111)
Creatinine, Ser: 0.9 mg/dL (ref 0.44–1.00)
GFR calc Af Amer: >60 mL/min (ref >60)
GFR calc non Af Amer: >60 mL/min (ref >60)
Glucose, Bld: 309 mg/dL — ABNORMAL HIGH (ref 70–99)
Potassium: 4.4 mmol/L (ref 3.5–5.1)
Sodium: 131 mmol/L — ABNORMAL LOW (ref 135–145)

## 2020-02-24 LAB — CBC
HCT: 37.6 % (ref 36.0–46.0)
Hemoglobin: 12.7 g/dL (ref 12.0–15.0)
MCH: 29.1 pg (ref 26.0–34.0)
MCHC: 33.8 g/dL (ref 30.0–36.0)
MCV: 86 fL (ref 80.0–100.0)
Platelets: 179 10*3/uL (ref 150–400)
RBC: 4.37 MIL/uL (ref 3.87–5.11)
RDW: 13 % (ref 11.5–15.5)
WBC: 5.6 10*3/uL (ref 4.0–10.5)
nRBC: 0 % (ref 0.0–0.2)

## 2020-02-24 LAB — GLUCOSE, CAPILLARY
Glucose-Capillary: 323 mg/dL — ABNORMAL HIGH (ref 70–99)
Glucose-Capillary: 312 mg/dL — ABNORMAL HIGH (ref 70–99)
Glucose-Capillary: 229 mg/dL — ABNORMAL HIGH (ref 70–99)
Glucose-Capillary: 236 mg/dL — ABNORMAL HIGH (ref 70–99)

## 2020-02-24 MED ORDER — CIPROFLOXACIN HCL 250 MG PO TABS: 250.0000 mg | ORAL_TABLET | Freq: Two times a day (BID) | ORAL | 0 refills | Status: AC

## 2020-02-24 MED ORDER — INSULIN GLARGINE 100 UNIT/ML ~~LOC~~ SOLN
16.0000 [IU] | Freq: Every day | SUBCUTANEOUS | Status: DC
  Administered 2020-02-24: 16 [IU] via SUBCUTANEOUS
  Filled 2020-02-24 (×2): qty 0.16

## 2020-02-24 MED ORDER — PEG-KCL-NACL-NASULF-NA ASC-C 100 G PO SOLR
0.5000 | Freq: Once | ORAL | Status: AC
  Administered 2020-02-24: 100 g via ORAL
  Filled 2020-02-24: qty 1

## 2020-02-24 MED ORDER — INSULIN NPH (HUMAN) (ISOPHANE) 100 UNIT/ML ~~LOC~~ SUSP: 20.0000 [IU] | Freq: Two times a day (BID) | SUBCUTANEOUS | 11 refills | Status: DC

## 2020-02-24 MED ORDER — ONDANSETRON 4 MG PO TBDP: 4.0000 mg | ORAL_TABLET | Freq: Three times a day (TID) | ORAL | 0 refills | Status: AC | PRN

## 2020-02-24 MED ORDER — SPIRONOLACTONE 25 MG PO TABS: 25.0000 mg | ORAL_TABLET | Freq: Every day | ORAL | 0 refills | Status: AC

## 2020-02-24 MED ORDER — ENOXAPARIN SODIUM 30 MG/0.3ML ~~LOC~~ SOLN: 30.0000 mg | SUBCUTANEOUS | Status: DC

## 2020-02-24 MED ORDER — FUROSEMIDE 40 MG PO TABS: 80.0000 mg | ORAL_TABLET | Freq: Every day | ORAL | 0 refills | Status: AC

## 2020-02-24 MED ORDER — PEG-KCL-NACL-NASULF-NA ASC-C 100 G PO SOLR
1.0000 | Freq: Once | ORAL | Status: AC
  Administered 2020-02-25: 200 g via ORAL

## 2020-02-24 NOTE — Progress Notes (Signed)
Received a secure chat from the nurse that patient's husband is at the bedside and is not comfortable/agreeable with the discharge plan.  Patient herself was alert and oriented and was agreeable with the plan of discharge.  She did not request me calling any family members.  I spoke to the patient's husband.  He is concerned that he is not able to take care of her at home.  I did explain to him that we have requested home health to see her.  His major concern is the fact that she will need to have colon prep before the colonoscopy that is scheduled on 18th May and that he cannot help her go to the bathroom in time and that she will lose stools all over the place on the floor and he is not comfortable cleaning that.  He absolutely declined to take her home and wanted colonoscopy to be done here.  Per his demand, GI was consulted.

## 2020-02-24 NOTE — Discharge Summary (Signed)
Physician Discharge Summary  Marisa Forbes [id number] DOB: 1951-07-29 DOA: 02/21/2020  PCP: McLean-Scocuzza, Nino Glow, MD  Admit date: 02/21/2020 Discharge date: 02/24/2020  Admitted From: Home Disposition: Home  Recommendations for Outpatient Follow-up:  1. Follow up with PCP in 1-2 weeks 2. Follow with GI as already scheduled 3. Please obtain BMP/CBC in one week 4. Please follow up on the following pending results:  Home Health: Yes Equipment/Devices: None  Discharge Condition: Stable CODE STATUS: DNR Diet recommendation: Soft/cardiac  Subjective: Patient seen and examined.  She feels much better.  She has been tolerating soft diet very well without having any nausea vomiting or any abdominal pain.  Last time she had vomiting was the night before.  She is agreeable to go home.  Brief/Interim Summary: Marisa Forbes is an 69 y.o. female  with medical history significant forHx of HTN, uncontrolled insulin-dependent Type 2 diabetes, combined CHF, CVA, CAD, HLD and hx of breast cancer who presented with intractable abdominal pain, nausea and vomiting.  Shewas just [facility] and observed overnight on 4/22 for syncope due to hypotension and abdominal pain and decrease PO intake. Upon returning home she tried to eat but then started to vomiting all her food. Emesis was non-bloody but somewhat bilious. She also noted constant dull epigastric pain that was 8/10 at its worse.  She returned to the ED.  Her pain had already resolved in the ED.  Patient was once again admitted under hospital service.  She was kept n.p.o. with pain medications.  CTA done in the ED showed moderate to large amount of stool in the right colon and cecum.  No acute pathology.  She was continued on PPI for her recent diagnosis of esophagitis/gastritis.  Patient was treated symptomatically with antiemetics.  She is now feeling much better.  Last vomiting was more than a day ago.  She has been tolerating soft diet  without having abdominal pain.  Since there was no source of abdominal pain was found so urinalysis was checked and she has positive leukoesterase, nitrites and bacteria.  Although she has no urinary complaints but her abdominal pain could very well be a symptom of UTI and for this reason, she will be discharged on ciprofloxacin to 50 mg p.o. for 5 days.  She is agreeable with the discharge plan.  She understands that she has a follow-up appointment scheduled with GI for colonoscopy as outpatient.  Due to n.p.o. status and dehydration, her diuretics were held here.  She can resume her both of the diuretics starting 02/26/2020.  Also her recent hemoglobin A1c about a week ago was 15.  She remained hyperglycemic here.  She takes NPH 15 units twice daily and 10 to 15 units NovoLog 3 times daily Premeal.  I am increasing her NPH to 20 units twice daily.  We will further defer to her PCP for management of diabetes as outpatient.  Discharge Diagnoses:  Active Problems:   CAD (coronary artery disease)   Poorly controlled type 2 diabetes mellitus with circulatory disorder (HCC)   Chronic combined systolic (congestive) and diastolic (congestive) heart failure (HCC)   Hyperglycemia due to type 2 diabetes mellitus (HCC)   Intractable vomiting with nausea   Pressure injury of skin   Protein-calorie malnutrition, severe    Discharge Instructions   Allergies as of 02/24/2020   No Known Allergies     Medication List    TAKE these medications   aspirin 81 MG EC tablet Take 1 tablet (81 mg  total) by mouth daily.   atorvastatin 80 MG tablet Commonly known as: LIPITOR TAKE 1 TABLET BY MOUTH DAILY.   carvedilol 25 MG tablet Commonly known as: COREG TAKE 1 TABLET BY MOUTH TWICE A DAY WITH MEALS What changed:   how much to take  how to take this  when to take this  additional instructions   ezetimibe 10 MG tablet Commonly known as: ZETIA TAKE 1 TABLET BY MOUTH EVERY DAY What changed:   how  much to take  how to take this  when to take this  additional instructions   feeding supplement (ENSURE ENLIVE) Liqd Take 237 mLs by mouth 2 (two) times daily as needed (If PO intakes of meals are poor). What changed:   when to take this  reasons to take this   furosemide 40 MG tablet Commonly known as: LASIX Take 2 tablets (80 mg total) by mouth daily. Start taking on: February 26, 2020 What changed: These instructions start on February 26, 2020. If you are unsure what to do until then, ask your doctor or other care provider.   glucose blood test strip Commonly known as: OneTouch Verio Use TID   hydrALAZINE 10 MG tablet Commonly known as: APRESOLINE Take 1 tablet (10 mg total) by mouth 2 (two) times daily as needed. For BP >130/>80 What changed:   reasons to take this  additional instructions   insulin NPH Human 100 UNIT/ML injection Commonly known as: NovoLIN N ReliOn Inject 0.2 mLs (20 Units total) into the skin 2 (two) times daily before a meal. What changed: how much to take   insulin regular 100 units/mL injection Commonly known as: NovoLIN R ReliOn Inject 0.1-0.15 mLs (10-15 Units total) into the skin 3 (three) times daily before meals. What changed: how much to take   isosorbide mononitrate 60 MG 24 hr tablet Commonly known as: IMDUR TAKE 1 TABLET BY MOUTH DAILY.   lisinopril 40 MG tablet Commonly known as: ZESTRIL Take 1 tablet (40 mg total) by mouth daily.   naproxen sodium 220 MG tablet Commonly known as: ALEVE Take 440 mg by mouth daily as needed (pain/headache).   nitroGLYCERIN 0.4 MG SL tablet Commonly known as: NITROSTAT Place 1 tablet (0.4 mg total) under the tongue every 5 (five) minutes as needed for chest pain.   ondansetron 4 MG disintegrating tablet Commonly known as: ZOFRAN-ODT Take 1 tablet (4 mg total) by mouth every 8 (eight) hours as needed for nausea or vomiting.   pantoprazole 40 MG tablet Commonly known as: PROTONIX Take 1  tablet (40 mg total) by mouth 2 (two) times daily. For 8 weeks, then transition to once daily What changed:   when to take this  additional instructions   polyethylene glycol 17 g packet Commonly known as: MIRALAX / GLYCOLAX Take 17 g by mouth 2 (two) times daily for 14 days. Once you have a regular bowel movement daily, can decrease to once daily What changed:   when to take this  additional instructions   potassium chloride 10 MEQ tablet Commonly known as: KLOR-CON Take 1 tablet (10 mEq total) by mouth daily.   spironolactone 25 MG tablet Commonly known as: ALDACTONE Take 1 tablet (25 mg total) by mouth daily. Start taking on: February 26, 2020 What changed: These instructions start on February 26, 2020. If you are unsure what to do until then, ask your doctor or other care provider.      Follow-up Information    McLean-Scocuzza, Nino Glow, MD  Follow up in 1 week(s).   Specialty: Internal Medicine Contact information: [place] [address] [phone]        Fay Records, MD .   Specialty: Cardiology Contact information: [address] [place] [address] [phone]          No Known Allergies  Consultations: None   Procedures/Studies: CT ABDOMEN PELVIS WO CONTRAST  Result Date: 02/14/2020 CLINICAL DATA:  Nausea and vomiting. EXAM: CT ABDOMEN AND PELVIS WITHOUT CONTRAST TECHNIQUE: Multidetector CT imaging of the abdomen and pelvis was performed following the standard protocol without IV contrast. COMPARISON:  Abdominal CT 12/11/2018, MRI 12/12/2018 FINDINGS: Lower chest: Cardiomegaly. Small left pleural effusion. There are coronary artery calcifications. Hepatobiliary: No focal hepatic lesion on noncontrast exam. Layering hyperdensity in the gallbladder may be sludge or stones. No pericholecystic inflammation. No biliary dilatation. Pancreas: No ductal dilatation or inflammation. Spleen: Normal in size without focal  abnormality. Adrenals/Urinary Tract: Adrenal thickening without dominant nodule. No hydronephrosis. No significant perinephric edema. Complex lesion in the lower pole left kidney measures 4.4 x 3.9 x 5.7 cm, not significantly changed from prior exam. This is previously characterized on MRI as hemorrhagic cyst. The urinary bladder is distended. No bladder wall thickening. Stomach/Bowel: Circumferential rectal wall thickening. Previous perirectal fluid collection no longer seen. No definite perirectal edema. Questionable wall thickening of the cecum. Formed stool in the ascending colon. Transverse colon is decompressed with equivocal wall thickening. Transition from decompressed transverse colon to stool-filled colon in the left abdomen, best appreciated on coronal series 5, image 26. Moderate stool in the distal transverse, descending, and sigmoid colon. Small bowel is decompressed and not well assessed. Fluid-filled stomach which is slightly prominent, but similar to prior exam. Appendix not confidently visualized. Vascular/Lymphatic: Aortic atherosclerosis. Peripheral branch atherosclerosis. No aortic aneurysm. No bulky abdominopelvic adenopathy. Reproductive: Uterus is slightly prominent but otherwise unremarkable. There periuterine vascular calcifications. No suspicious adnexal mass. Other: Generalized body wall edema. No definite ascites. No free air. Musculoskeletal: Bones are diffusely under mineralized. Scoliosis with multilevel degenerative change in the spine. There are no acute or suspicious osseous abnormalities. IMPRESSION: 1. Areas of possible colonic wall thickening involving the cecum, transverse colon, as well as rectum. Query segmental colitis. Transition from dilated to nondilated colon at the splenic flexure, recommend direct visualization with colonoscopy to exclude underlying colonic neoplasm. 2. Distended urinary bladder. 3. Layering hyperdensity in the gallbladder may be sludge or stones. No  pericholecystic inflammation. 4. Stable complex left lower pole renal lesion, previously characterized on MRI as hemorrhagic cyst. 5. Small left pleural effusion. Diffuse body wall edema suggest third-spacing. Aortic Atherosclerosis (ICD10-I70.0). Electronically Signed   By: Keith Rake M.D.   On: 02/14/2020 23:47   CT HEAD WO CONTRAST  Result Date: 02/14/2020 CLINICAL DATA:  Headache, nausea vomiting, hypertension EXAM: CT HEAD WITHOUT CONTRAST TECHNIQUE: Contiguous axial images were obtained from the base of the skull through the vertex without intravenous contrast. COMPARISON:  12/11/2018 FINDINGS: Brain: Hypodensities throughout the periventricular and subcortical white matter are again noted, most prominent in the right occipital lobe, consistent with chronic ischemic change. No sign of acute infarct or hemorrhage. Lateral ventricles and midline structures are grossly unremarkable. Chronic right thalamic lacunar infarct again noted. No acute extra-axial fluid collections. No mass effect. Vascular: No hyperdense vessel or unexpected calcification. Skull: Normal. Negative for fracture or focal lesion. Sinuses/Orbits: There is opacification of the left sphenoid sinus. Remaining paranasal sinuses are clear. Other: None. IMPRESSION: 1. Chronic  ischemic changes throughout the periventricular and subcortical white matter. 2. No acute intracranial process. 3. Left sphenoid sinus disease. Electronically Signed   By: Randa Ngo M.D.   On: 02/14/2020 18:56   MR BRAIN WO CONTRAST  Result Date: 02/15/2020 CLINICAL DATA:  Encephalopathy EXAM: MRI HEAD WITHOUT CONTRAST TECHNIQUE: Multiplanar, multiecho pulse sequences of the brain and surrounding structures were obtained without intravenous contrast. COMPARISON:  Brain MRI 12/22/2019 FINDINGS: Brain: No acute infarct, acute hemorrhage or extra-axial collection. Early confluent hyperintense T2-weighted signal of the periventricular and deep white matter, most  commonly due to chronic ischemic microangiopathy. Old right occipital lobe infarct site with encephalomalacia unchanged. Normal volume of CSF spaces. Old right cerebellar and thalamic small vessel infarct. Multifocal chronic microhemorrhage in a predominantly central distribution, consistent with chronic hypertensive angiopathy. Normal midline structures. Vascular: Normal flow voids. Skull and upper cervical spine: Normal marrow signal. Sinuses/Orbits: Chronic sphenoid sinus disease.  Normal orbits. Other: None. IMPRESSION: 1. No acute intracranial abnormality. 2. Old right occipital lobe infarct, right cerebellar and right thalamic infarcts. 3. Findings of chronic hypertensive angiopathy. Electronically Signed   By: Ulyses Jarred M.D.   On: 02/15/2020 03:22   DG CHEST PORT 1 VIEW  Result Date: 02/14/2020 CLINICAL DATA:  Altered mental status. Nausea and vomiting. EXAM: PORTABLE CHEST 1 VIEW COMPARISON:  05/29/2019 FINDINGS: Patient is rotated. Upper normal heart size. Unchanged mediastinal contours allowing for rotation. Obscuration of left hemidiaphragm likely small pleural effusion. No focal airspace disease. No pulmonary edema. No pneumothorax. Bones are under mineralized. Surgical clips project over the left breast. IMPRESSION: Obscuration of left hemidiaphragm likely small pleural effusion. Borderline cardiomegaly. Electronically Signed   By: Keith Rake M.D.   On: 02/14/2020 23:14   CT Angio Abd/Pel W and/or Wo Contrast  Result Date: 02/21/2020 CLINICAL DATA:  Upper abdominal pain and 30 pound weight loss EXAM: CTA ABDOMEN AND PELVIS WITHOUT AND WITH CONTRAST TECHNIQUE: Multidetector CT imaging of the abdomen and pelvis was performed using the standard protocol during bolus administration of intravenous contrast. Multiplanar reconstructed images and MIPs were obtained and reviewed to evaluate the vascular anatomy. CONTRAST:  147mL OMNIPAQUE IOHEXOL 350 MG/ML SOLN COMPARISON:  February 14, 2020, MRI  abdomen December 12, 2018 FINDINGS: CTA ABDOMEN AND PELVIS FINDINGS VASCULAR Aorta: Normal caliber aorta without aneurysm, dissection, vasculitis or hemodynamically significant stenosis. There is also scattered aortic atherosclerosis. Celiac: No aneurysm, dissection or hemodynamically significant stenosis. Normal branching pattern SMA: Widely patent without dissection or stenosis. Renals: Single renal arteries bilaterally. Mild stenosis at the origin of the renal arteries due to atherosclerosis. No fibromuscular dysplasia or aneurysm. IMA: Scattered atherosclerosis at the origin of the bilateral iliacs without stenosis. Inflow: No aneurysm, stenosis or dissection. Veins: Normal course and caliber of the major veins. Review of the MIP images confirms the above findings. NON-VASCULAR Hepatobiliary: Normal hepatic contours and density. No visible biliary dilatation. Normal gallbladder. Pancreas: Normal contours without ductal dilatation. No peripancreatic fluid collection. Spleen: Normal arterial phase splenic enhancement pattern. Adrenals/Urinary Tract: --Adrenal glands: Normal. --Right kidney/ureter: No hydronephrosis or perinephric stranding. No nephrolithiasis. No obstructing ureteral stones. --Left kidney/ureter: As on the prior examinations dating back to the MRI abdomen heterogeneously hyperdense mass seen partially exophytic off the lower pole of the left kidney measuring 4.2 x 4.1 cm which was thought to be a hemorrhagic cyst. --Urinary bladder: Mildly distended and fluid-filled. Stomach/Bowel: --Stomach/Duodenum: No hiatal hernia or other gastric abnormality. Normal duodenal course and caliber. --Small bowel: No dilatation or inflammation. --Colon: There  is a moderate to large amount of colonic stool seen within the right colon and cecum. Within the sigmoid rectal junction there appears to be question of mild wall thickening. Lymphatic:  No abdominal or pelvic lymphadenopathy. Reproductive: No free fluid in  the pelvis. Somewhat limited visualization of the uterus, which appears mildly prominent. Musculoskeletal. No bony spinal canal stenosis or focal osseous abnormality. There is diffuse osteopenia. Degenerative changes seen in the thoracolumbar spine. Other: None. Review of the MIP images confirms the above findings. IMPRESSION: VASCULAR No acute vascular abnormality. Aortic Atherosclerosis (ICD10-I70.0). NON-VASCULAR Stable exophytic left renal probable hemorrhagic cyst measuring 4.2 x 4.1 cm. Question of mild wall thickening seen at the sigmoid rectal junction. If further evaluation is required would recommend direct visualization with colonoscopy. Diffuse mild anasarca. Mildly prominent partially visualized uterus. Electronically Signed   By: Prudencio Pair M.D.   On: 02/21/2020 22:53      Discharge Exam: Vitals:   02/24/20 0021 02/24/20 0627  BP: 120/61 (!) 148/76  Pulse: 75 78  Resp: 17 16  Temp: 98.8 F (37.1 C) 98 F (36.7 C)  SpO2: 98% 97%   Vitals:   02/23/20 0949 02/23/20 1706 02/24/20 0021 02/24/20 0627  BP: 113/75 126/72 120/61 (!) 148/76  Pulse: 66 (!) 57 75 78  Resp: 18 18 17 16   Temp: 97.7 F (36.5 C) 98.3 F (36.8 C) 98.8 F (37.1 C) 98 F (36.7 C)  TempSrc: Oral Oral Oral Oral  SpO2: 100% 96% 98% 97%  Weight:      Height:        General: Pt is alert, awake, not in acute distress Cardiovascular: RRR, S1/S2 +, no rubs, no gallops Respiratory: CTA bilaterally, no wheezing, no rhonchi Abdominal: Soft, NT, ND, bowel sounds + Extremities: no edema, no cyanosis    The results of significant diagnostics from this hospitalization (including imaging, microbiology, ancillary and laboratory) are listed below for reference.     Microbiology: Recent Results (from the past 240 hour(s))  SARS CORONAVIRUS 2 (TAT 6-24 HRS) Nasopharyngeal Nasopharyngeal Swab     Status: None   Collection Time: 02/14/20 11:14 PM   Specimen: Nasopharyngeal Swab  Result Value Ref Range Status    SARS Coronavirus 2 NEGATIVE NEGATIVE Final    Comment: (NOTE) SARS-CoV-2 target nucleic acids are NOT DETECTED. The SARS-CoV-2 RNA is generally detectable in upper and lower respiratory specimens during the acute phase of infection. Negative results do not preclude SARS-CoV-2 infection, do not rule out co-infections with other pathogens, and should not be used as the sole basis for treatment or other patient management decisions. Negative results must be combined with clinical observations, patient history, and epidemiological information. The expected result is Negative. Fact Sheet for Patients: SugarRoll.be Fact Sheet for Healthcare Providers: https://www.woods-mathews.com/ This test is not yet approved or cleared by the [place] FDA and  has been authorized for detection and/or diagnosis of SARS-CoV-2 by FDA under an Emergency Use Authorization (EUA). This EUA will remain  in effect (meaning this test can be used) for the duration of the COVID-19 declaration under Section 56 4(b)(1) of the Act, 21 U.S.C. section 360bbb-3(b)(1), unless the authorization is terminated or revoked sooner. Performed at [facility], [place] [address] [place], [place] [address]   Respiratory Panel by RT PCR (Flu A&B, Covid) - Nasopharyngeal Swab     Status: None   Collection Time: 02/19/20  1:42 PM   Specimen: Nasopharyngeal Swab  Result Value Ref Range Status   SARS  Coronavirus 2 by RT PCR NEGATIVE NEGATIVE Final    Comment: (NOTE) SARS-CoV-2 target nucleic acids are NOT DETECTED. The SARS-CoV-2 RNA is generally detectable in upper respiratoy specimens during the acute phase of infection. The lowest concentration of SARS-CoV-2 viral copies this assay can detect is 131 copies/mL. A negative result does not preclude SARS-Cov-2 infection and should not be used as the sole basis for treatment or other patient management decisions. A negative result  may occur with  improper specimen collection/handling, submission of specimen other than nasopharyngeal swab, presence of viral mutation(s) within the areas targeted by this assay, and inadequate number of viral copies (<131 copies/mL). A negative result must be combined with clinical observations, patient history, and epidemiological information. The expected result is Negative. Fact Sheet for Patients:  PinkCheek.be Fact Sheet for Healthcare Providers:  GravelBags.it This test is not yet ap proved or cleared by the [place] FDA and  has been authorized for detection and/or diagnosis of SARS-CoV-2 by FDA under an Emergency Use Authorization (EUA). This EUA will remain  in effect (meaning this test can be used) for the duration of the COVID-19 declaration under Section 564(b)(1) of the Act, 21 U.S.C. section 360bbb-3(b)(1), unless the authorization is terminated or revoked sooner.    Influenza A by PCR NEGATIVE NEGATIVE Final   Influenza B by PCR NEGATIVE NEGATIVE Final    Comment: (NOTE) The Xpert Xpress SARS-CoV-2/FLU/RSV assay is intended as an aid in  the diagnosis of influenza from Nasopharyngeal swab specimens and  should not be used as a sole basis for treatment. Nasal washings and  aspirates are unacceptable for Xpert Xpress SARS-CoV-2/FLU/RSV  testing. Fact Sheet for Patients: PinkCheek.be Fact Sheet for Healthcare Providers: GravelBags.it This test is not yet approved or cleared by the [place] FDA and  has been authorized for detection and/or diagnosis of SARS-CoV-2 by  FDA under an Emergency Use Authorization (EUA). This EUA will remain  in effect (meaning this test can be used) for the duration of the  Covid-19 declaration under Section 564(b)(1) of the Act, 21  U.S.C. section 360bbb-3(b)(1), unless the authorization is  terminated or  revoked. Performed at [facility], [place] [address] [place], [place] [address]   Respiratory Panel by RT PCR (Flu A&B, Covid) - Nasopharyngeal Swab     Status: None   Collection Time: 02/22/20  1:03 AM   Specimen: Nasopharyngeal Swab  Result Value Ref Range Status   SARS Coronavirus 2 by RT PCR NEGATIVE NEGATIVE Final    Comment: (NOTE) SARS-CoV-2 target nucleic acids are NOT DETECTED. The SARS-CoV-2 RNA is generally detectable in upper respiratoy specimens during the acute phase of infection. The lowest concentration of SARS-CoV-2 viral copies this assay can detect is 131 copies/mL. A negative result does not preclude SARS-Cov-2 infection and should not be used as the sole basis for treatment or other patient management decisions. A negative result may occur with  improper specimen collection/handling, submission of specimen other than nasopharyngeal swab, presence of viral mutation(s) within the areas targeted by this assay, and inadequate number of viral copies (<131 copies/mL). A negative result must be combined with clinical observations, patient history, and epidemiological information. The expected result is Negative. Fact Sheet for Patients:  PinkCheek.be Fact Sheet for Healthcare Providers:  GravelBags.it This test is not yet ap proved or cleared by the [place] FDA and  has been authorized for detection and/or diagnosis of SARS-CoV-2 by FDA under an Emergency Use Authorization (EUA). This EUA will  remain  in effect (meaning this test can be used) for the duration of the COVID-19 declaration under Section 564(b)(1) of the Act, 21 U.S.C. section 360bbb-3(b)(1), unless the authorization is terminated or revoked sooner.    Influenza A by PCR NEGATIVE NEGATIVE Final   Influenza B by PCR NEGATIVE NEGATIVE Final    Comment: (NOTE) The Xpert Xpress SARS-CoV-2/FLU/RSV assay is intended as an aid in  the  diagnosis of influenza from Nasopharyngeal swab specimens and  should not be used as a sole basis for treatment. Nasal washings and  aspirates are unacceptable for Xpert Xpress SARS-CoV-2/FLU/RSV  testing. Fact Sheet for Patients: PinkCheek.be Fact Sheet for Healthcare Providers: GravelBags.it This test is not yet approved or cleared by the [place] FDA and  has been authorized for detection and/or diagnosis of SARS-CoV-2 by  FDA under an Emergency Use Authorization (EUA). This EUA will remain  in effect (meaning this test can be used) for the duration of the  Covid-19 declaration under Section 564(b)(1) of the Act, 21  U.S.C. section 360bbb-3(b)(1), unless the authorization is  terminated or revoked. Performed at [facility], [place] [address] [place], [place] [address]      Labs: BNP (last 3 results) No results for input(s): BNP in the last 8760 hours. Basic Metabolic Panel: Recent Labs  Lab 02/20/20 0555 02/21/20 1927 02/22/20 0419 02/23/20 0329 02/24/20 0427  NA 136 133* 135 131* 131*  K 3.0* 3.8 3.2* 4.0 4.4  CL 95* 92* 97* 96* 95*  CO2 28 29 29 28 28   GLUCOSE 91 240* 209* 192* 309*  BUN 45* 33* 29* 24* 23  CREATININE 0.84 0.98 0.81 0.80 0.90  CALCIUM 9.6 9.7 8.8* 8.4* 8.5*   Liver Function Tests: Recent Labs  Lab 02/19/20 1644 02/21/20 1927 02/23/20 0329  AST 15 30 23   ALT 13 24 22   ALKPHOS 53 93 65  BILITOT 0.9 1.6* 0.9  PROT 3.8* 5.9* 4.4*  ALBUMIN 2.1* 3.3* 2.4*   Recent Labs  Lab 02/19/20 1644 02/21/20 1927  LIPASE 61* 67*   No results for input(s): AMMONIA in the last 168 hours. CBC: Recent Labs  Lab 02/19/20 1100 02/19/20 1100 02/20/20 0555 02/21/20 1927 02/22/20 0419 02/23/20 0329 02/24/20 0427  WBC 5.7   < > 9.1 7.8 5.4 5.4 5.6  NEUTROABS 4.1  --  6.4  --   --   --   --   HGB 15.2*   < > 16.3* 16.6* 13.5 12.8 12.7  HCT 45.6   < > 47.6* 49.6* 40.4 38.2 37.6  MCV  86.2   < > 86.1 86.9 86.5 86.0 86.0  PLT 257   < > 259 243 195 182 179   < > = values in this interval not displayed.   Cardiac Enzymes: No results for input(s): CKTOTAL, CKMB, CKMBINDEX, TROPONINI in the last 168 hours. BNP: Invalid input(s): POCBNP CBG: Recent Labs  Lab 02/23/20 1127 02/23/20 1530 02/23/20 1636 02/23/20 2132 02/24/20 0629  GLUCAP 162* 246* 270* 340* 323*   D-Dimer No results for input(s): DDIMER in the last 72 hours. Hgb A1c No results for input(s): HGBA1C in the last 72 hours. Lipid Profile No results for input(s): CHOL, HDL, LDLCALC, TRIG, CHOLHDL, LDLDIRECT in the last 72 hours. Thyroid function studies No results for input(s): TSH, T4TOTAL, T3FREE, THYROIDAB in the last 72 hours.  Invalid input(s): FREET3 Anemia work up No results for input(s): VITAMINB12, FOLATE, FERRITIN, TIBC, IRON, RETICCTPCT in the last 72 hours. Urinalysis  Component Value Date/Time   COLORURINE YELLOW 02/23/2020 2130   APPEARANCEUR HAZY (A) 02/23/2020 2130   LABSPEC 1.007 02/23/2020 2130   PHURINE 6.0 02/23/2020 2130   GLUCOSEU >=500 (A) 02/23/2020 2130   GLUCOSEU >=1000 (A) 09/30/2019 1453   HGBUR SMALL (A) 02/23/2020 2130   BILIRUBINUR NEGATIVE 02/23/2020 2130   KETONESUR NEGATIVE 02/23/2020 2130   PROTEINUR 100 (A) 02/23/2020 2130   UROBILINOGEN 0.2 09/30/2019 1453   NITRITE POSITIVE (A) 02/23/2020 2130   LEUKOCYTESUR MODERATE (A) 02/23/2020 2130   Sepsis Labs Invalid input(s): PROCALCITONIN,  WBC,  LACTICIDVEN Microbiology Recent Results (from the past 240 hour(s))  SARS CORONAVIRUS 2 (TAT 6-24 HRS) Nasopharyngeal Nasopharyngeal Swab     Status: None   Collection Time: 02/14/20 11:14 PM   Specimen: Nasopharyngeal Swab  Result Value Ref Range Status   SARS Coronavirus 2 NEGATIVE NEGATIVE Final    Comment: (NOTE) SARS-CoV-2 target nucleic acids are NOT DETECTED. The SARS-CoV-2 RNA is generally detectable in upper and lower respiratory specimens during the  acute phase of infection. Negative results do not preclude SARS-CoV-2 infection, do not rule out co-infections with other pathogens, and should not be used as the sole basis for treatment or other patient management decisions. Negative results must be combined with clinical observations, patient history, and epidemiological information. The expected result is Negative. Fact Sheet for Patients: SugarRoll.be Fact Sheet for Healthcare Providers: https://www.woods-mathews.com/ This test is not yet approved or cleared by the [place] FDA and  has been authorized for detection and/or diagnosis of SARS-CoV-2 by FDA under an Emergency Use Authorization (EUA). This EUA will remain  in effect (meaning this test can be used) for the duration of the COVID-19 declaration under Section 56 4(b)(1) of the Act, 21 U.S.C. section 360bbb-3(b)(1), unless the authorization is terminated or revoked sooner. Performed at [facility], [place] [address] [place], [place] [address]   Respiratory Panel by RT PCR (Flu A&B, Covid) - Nasopharyngeal Swab     Status: None   Collection Time: 02/19/20  1:42 PM   Specimen: Nasopharyngeal Swab  Result Value Ref Range Status   SARS Coronavirus 2 by RT PCR NEGATIVE NEGATIVE Final    Comment: (NOTE) SARS-CoV-2 target nucleic acids are NOT DETECTED. The SARS-CoV-2 RNA is generally detectable in upper respiratoy specimens during the acute phase of infection. The lowest concentration of SARS-CoV-2 viral copies this assay can detect is 131 copies/mL. A negative result does not preclude SARS-Cov-2 infection and should not be used as the sole basis for treatment or other patient management decisions. A negative result may occur with  improper specimen collection/handling, submission of specimen other than nasopharyngeal swab, presence of viral mutation(s) within the areas targeted by this assay, and inadequate number of viral  copies (<131 copies/mL). A negative result must be combined with clinical observations, patient history, and epidemiological information. The expected result is Negative. Fact Sheet for Patients:  PinkCheek.be Fact Sheet for Healthcare Providers:  GravelBags.it This test is not yet ap proved or cleared by the [place] FDA and  has been authorized for detection and/or diagnosis of SARS-CoV-2 by FDA under an Emergency Use Authorization (EUA). This EUA will remain  in effect (meaning this test can be used) for the duration of the COVID-19 declaration under Section 564(b)(1) of the Act, 21 U.S.C. section 360bbb-3(b)(1), unless the authorization is terminated or revoked sooner.    Influenza A by PCR NEGATIVE NEGATIVE Final   Influenza B by PCR NEGATIVE NEGATIVE Final  Comment: (NOTE) The Xpert Xpress SARS-CoV-2/FLU/RSV assay is intended as an aid in  the diagnosis of influenza from Nasopharyngeal swab specimens and  should not be used as a sole basis for treatment. Nasal washings and  aspirates are unacceptable for Xpert Xpress SARS-CoV-2/FLU/RSV  testing. Fact Sheet for Patients: PinkCheek.be Fact Sheet for Healthcare Providers: GravelBags.it This test is not yet approved or cleared by the [place] FDA and  has been authorized for detection and/or diagnosis of SARS-CoV-2 by  FDA under an Emergency Use Authorization (EUA). This EUA will remain  in effect (meaning this test can be used) for the duration of the  Covid-19 declaration under Section 564(b)(1) of the Act, 21  U.S.C. section 360bbb-3(b)(1), unless the authorization is  terminated or revoked. Performed at [facility], [place] [address] [place], [place] [address]   Respiratory Panel by RT PCR (Flu A&B, Covid) - Nasopharyngeal Swab     Status: None   Collection Time: 02/22/20  1:03 AM   Specimen:  Nasopharyngeal Swab  Result Value Ref Range Status   SARS Coronavirus 2 by RT PCR NEGATIVE NEGATIVE Final    Comment: (NOTE) SARS-CoV-2 target nucleic acids are NOT DETECTED. The SARS-CoV-2 RNA is generally detectable in upper respiratoy specimens during the acute phase of infection. The lowest concentration of SARS-CoV-2 viral copies this assay can detect is 131 copies/mL. A negative result does not preclude SARS-Cov-2 infection and should not be used as the sole basis for treatment or other patient management decisions. A negative result may occur with  improper specimen collection/handling, submission of specimen other than nasopharyngeal swab, presence of viral mutation(s) within the areas targeted by this assay, and inadequate number of viral copies (<131 copies/mL). A negative result must be combined with clinical observations, patient history, and epidemiological information. The expected result is Negative. Fact Sheet for Patients:  PinkCheek.be Fact Sheet for Healthcare Providers:  GravelBags.it This test is not yet ap proved or cleared by the [place] FDA and  has been authorized for detection and/or diagnosis of SARS-CoV-2 by FDA under an Emergency Use Authorization (EUA). This EUA will remain  in effect (meaning this test can be used) for the duration of the COVID-19 declaration under Section 564(b)(1) of the Act, 21 U.S.C. section 360bbb-3(b)(1), unless the authorization is terminated or revoked sooner.    Influenza A by PCR NEGATIVE NEGATIVE Final   Influenza B by PCR NEGATIVE NEGATIVE Final    Comment: (NOTE) The Xpert Xpress SARS-CoV-2/FLU/RSV assay is intended as an aid in  the diagnosis of influenza from Nasopharyngeal swab specimens and  should not be used as a sole basis for treatment. Nasal washings and  aspirates are unacceptable for Xpert Xpress SARS-CoV-2/FLU/RSV  testing. Fact Sheet for  Patients: PinkCheek.be Fact Sheet for Healthcare Providers: GravelBags.it This test is not yet approved or cleared by the [place] FDA and  has been authorized for detection and/or diagnosis of SARS-CoV-2 by  FDA under an Emergency Use Authorization (EUA). This EUA will remain  in effect (meaning this test can be used) for the duration of the  Covid-19 declaration under Section 564(b)(1) of the Act, 21  U.S.C. section 360bbb-3(b)(1), unless the authorization is  terminated or revoked. Performed at [facility], [place] [address] [place], [place] [address]      Time coordinating discharge: Over 30 minutes  SIGNED:   Darliss Cheney, MD  [facility] 02/24/2020, 9:58 AM  If 7PM-7AM, please contact night-coverage [email or web address]

## 2020-02-24 NOTE — Progress Notes (Signed)
Discharge teaching complete. Meds, diet, activity, follow up appointments reviewed and all questions answered. Copy of instructions given to patient and prescriptions sent to pharmacy. Patient waiting for husband to come pick her up.

## 2020-02-24 NOTE — H&P (View-Only) (Signed)
   Consultation  Referring Provider: Dr. Pahwani    Primary Care Physician:  McLean-Scocuzza, Tracy N, MD Primary Gastroenterologist: Unassigned Reason for Consultation:   Abnormal CT of the colon         HPI:   Marisa Forbes is a 69 y.o. female with a past medical history as listed below including CHF, combined EF 25-30%, MI 2013, cardiac stent 2013, CVA 2013 post cardiac cath, renal artery stenosis, left breast cancer treated with lumpectomy, radiation, chemo 2014, and others whom we are consulted on for a colonic mass.    02/19/2020 patient consulted by our service for colitis with diarrhea.  At that time was noted she had been admitted 02/14/2020-02/16/2020 with hypertensive encephalopathy, glucose 354, presented with bilious vomiting, acute onset upper abdominal pain suspected acute gastroenteritis.  02/14/2020 CTAP without contrast showed possible wall thickening the cecum, transverse colon and rectum.  Colonoscopy was recommended to rule out underlying neoplasm.  At time of last consult patient had ongoing anorexia since being discharged from the hospital with occasional vomiting of bilious material.  She was diagnosed with colitis with concern for underlying neoplasm at the level of the splenic flexure.  Patient was set up for an EGD and colonoscopy the next morning.    02/20/2020 colonoscopy with Dr. Gupta, but the procedure was aborted due to inadequate bowel prep, abnormal digital exam showing rectal impaction, she was manually disimpacted.  Told return to GI clinic in 2 to 3 weeks to have an outpatient colonoscopy.  EGD that day with LA grade B reflux esophagitis with no bleeding, small hiatal hernia, gastritis and normal duodenum.  Patient was put on pantoprazole 40 p.o. twice daily x8 weeks, then once daily.  Patient was set up for outpatient colonoscopy on May 18.      02/21/2020 patient return to the hospital for intractable abdominal pain, nausea and vomiting.  She was diagnosed with  possibly worsening gastritis/esophagitis.  Patient was treated symptomatically with antiemetics and started feeling better.  She is tolerating a soft diet.  Plans were for discharge on ciprofloxacin given a UTI and colonoscopy outpatient but the patient's husband does not feel like she can prep at home so he is wanting her to stay here and have colonoscopy inpatient.    Today, the patient was found with her son by her bedside.  He reiterates that there is no way that they could possibly prep her at home for colonoscopy.  It is just him in her and he cannot lift her.  She explains that she is feeling much better, no longer nauseous or vomiting and her abdominal pain is much better.  She believes that she could prep for a colonoscopy.    Denies fever or chills.  Past Medical History:  Diagnosis Date  . Arthritis    back  . Balance problems    due to stroke  . Benign head tremor    per pt due to muscle spasm in neck   . Breast cancer (HCC) 12/03/12   a. 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 S/p chemo with CMF and radiation.  . Cataracts, bilateral   . CHF (congestive heart failure) (HCC)    On Monday she said her cardiologist was concerned for possible CHF, but is now improving-per pt.(07/26/15)  . Coronary artery disease    a. STEMI 08/2012: s/p DES to LAD, PTCA to downstream LAD. b. Relook cath same hospitalization: stable post-PCI anatomy with Stable Diag lesions (  unlikely cause of resting angina, not good PCI targets), stable RCA lesion (non flow limiting).  . Diabetes mellitus without complication (HCC)    Takes insulin and metformin  . Dysplasia of cervix, low grade (CIN 1)    in 02 s/p LEEP   . GERD (gastroesophageal reflux disease)    food related  . History of cancer chemotherapy    last in August 2014  . History of echocardiogram    Echo 2/19: Mild concentric LVH, moderate focal basal septal hypertrophy, EF 45-50, apical akinesis (?apical  pseudoaneurysm), anteroseptal akinesis, grade 1 diastolic dysfunction, MAC  . History of radiation therapy 06/23/2013-08/08/2013   62.4 gray to left breast  . HTN (hypertension)   . Hypercholesteremia   . Insomnia   . Left kidney mass   . LV dysfunction    a. EF 45-50% at time of STEMI 08/2012. b. Echo 04/2013: EF 45-50%, mild focal basal hypertrophy of septum, grade 1 d/d.  . Myocardial infarction (HCC) 09/07/12  . Peripheral vision loss    left  . Perirectal abscess    11/2018   . Pleural effusion, bilateral   . Pneumonia   . Renal artery stenosis (HCC)    a. 20% L RAS in 08/2012 by cath. b. Duplex 07/2012: >60% L RAS.  . Stroke, embolic (HCC)    a. 08/2012 post cath. with residual reduced vision in left eye; noted right occiptal lobe/PCA infarct   . UTI (urinary tract infection)   . UTI (urinary tract infection)   . Vasovagal syncope    a. 04/2013 - echo stable compared to prior EF 45-50%, negative carotid duplex.    Past Surgical History:  Procedure Laterality Date  . BACK SURGERY     x3, lower back  . BIOPSY  02/20/2020   Procedure: BIOPSY;  Surgeon: Gupta, Rajesh, MD;  Location: [facility];  Service: Endoscopy;;  . BREAST BIOPSY Left 12/03/2012   U/S Core- Malignant  . BREAST LUMPECTOMY Left 01/08/2013  . BREAST LUMPECTOMY WITH NEEDLE LOCALIZATION AND AXILLARY SENTINEL LYMPH NODE BX Left 01/08/2013   Procedure: LEFT BREAST WIRE GUIDED  LUMPECTOMY AND LEFT AXILLARY SENTINEL  NODE BX;  Surgeon: Matthew Wakefield, MD;  Location: [facility];  Service: General;  Laterality: Left;  . CATARACT EXTRACTION Bilateral   . CERVICAL BIOPSY  W/ LOOP ELECTRODE EXCISION     02/2001  . CORONARY ANGIOPLASTY WITH STENT PLACEMENT    . ESOPHAGOGASTRODUODENOSCOPY (EGD) WITH PROPOFOL N/A 02/20/2020   Procedure: ESOPHAGOGASTRODUODENOSCOPY (EGD) WITH PROPOFOL;  Surgeon: Gupta, Rajesh, MD;  Location: [facility];  Service: Endoscopy;  Laterality: N/A;  . EYE SURGERY     b/l cataract   . IMPACTION  REMOVAL  02/20/2020   Procedure: IMPACTION REMOVAL;  Surgeon: Gupta, Rajesh, MD;  Location: [facility];  Service: Endoscopy;;  Manual Stool Disimpaction  . INCISION AND DRAINAGE PERIRECTAL ABSCESS N/A 12/11/2018   Procedure: IRRIGATION AND DEBRIDEMENT PERIRECTAL ABSCESS;  Surgeon: Martin, Matthew, MD;  Location: [facility];  Service: General;  Laterality: N/A;  . LEFT HEART CATHETERIZATION WITH CORONARY ANGIOGRAM N/A 09/07/2012   Procedure: LEFT HEART CATHETERIZATION WITH CORONARY ANGIOGRAM;  Surgeon: Thomas A Kelly, MD;  Location: [facility];  Service: Cardiovascular;  Laterality: N/A;  . LEFT HEART CATHETERIZATION WITH CORONARY ANGIOGRAM  09/09/2012   Procedure: LEFT HEART CATHETERIZATION WITH CORONARY ANGIOGRAM;  Surgeon: David W Harding, MD;  Location: [facility];  Service: Cardiovascular;;  . PERCUTANEOUS CORONARY STENT INTERVENTION (PCI-S) N/A 09/07/2012   Procedure: PERCUTANEOUS CORONARY STENT INTERVENTION (  PCI-S);  Surgeon: Thomas A Kelly, MD;  Location: [facility];  Service: Cardiovascular;  Laterality: N/A;  . PORT-A-CATH REMOVAL N/A 10/14/2013   Procedure: REMOVAL PORT-A-CATH;  Surgeon: Matthew Wakefield, MD;  Location: [facility];  Service: General;  Laterality: N/A;  . PORTACATH PLACEMENT Right 02/17/2013   Procedure: INSERTION PORT-A-CATH;  Surgeon: Matthew Wakefield, MD;  Location: [facility];  Service: General;  Laterality: Right;  . THORACENTESIS     12/13/2018 atypical cells not definitely diag. of malignancy     Family History  Problem Relation Age of Onset  . COPD Mother   . Heart disease Father   . Hypertension Father   . Diabetes Brother   . Hypertension Brother   . Learning disabilities Brother   . Hypertension Brother   . Asthma Daughter   . Cancer Neg Hx   . Hyperlipidemia Neg Hx   . Kidney disease Neg Hx   . Stroke Neg Hx     Social History   Tobacco Use  . Smoking status: Never Smoker  . Smokeless tobacco: Never Used  Substance Use Topics  . Alcohol use: Not  Currently  . Drug use: No    Prior to Admission medications   Medication Sig Start Date End Date Taking? Authorizing Provider  aspirin 81 MG EC tablet Take 1 tablet (81 mg total) by mouth daily. 04/16/15  Yes Madera, Carlos, MD  carvedilol (COREG) 25 MG tablet TAKE 1 TABLET BY MOUTH TWICE A DAY WITH MEALS Patient taking differently: Take 25 mg by mouth 2 (two) times daily with a meal.  07/14/19  Yes Ross, Paula V, MD  ezetimibe (ZETIA) 10 MG tablet TAKE 1 TABLET BY MOUTH EVERY DAY Patient taking differently: Take 10 mg by mouth at bedtime.  07/15/19  Yes Ross, Paula V, MD  feeding supplement, ENSURE ENLIVE, (ENSURE ENLIVE) LIQD Take 237 mLs by mouth 2 (two) times daily as needed (If PO intakes of meals are poor). Patient taking differently: Take 237 mLs by mouth every other day as needed (if meal intake is poor).  12/14/18  Yes Hall, Carole N, DO  hydrALAZINE (APRESOLINE) 10 MG tablet Take 1 tablet (10 mg total) by mouth 2 (two) times daily as needed. For BP >130/>80 Patient taking differently: Take 10 mg by mouth 2 (two) times daily as needed (For BP >130/>80).  12/25/18  Yes McLean-Scocuzza, Tracy N, MD  insulin regular (NOVOLIN R RELION) 100 units/mL injection Inject 0.1-0.15 mLs (10-15 Units total) into the skin 3 (three) times daily before meals. Patient taking differently: Inject 15 Units into the skin 3 (three) times daily before meals.  11/04/19  Yes Gherghe, Cristina, MD  isosorbide mononitrate (IMDUR) 60 MG 24 hr tablet TAKE 1 TABLET BY MOUTH DAILY. Patient taking differently: Take 60 mg by mouth daily.  07/14/19  Yes Ross, Paula V, MD  lisinopril (ZESTRIL) 40 MG tablet Take 1 tablet (40 mg total) by mouth daily. 02/16/20  Yes Ayiku, Bernard, MD  naproxen sodium (ALEVE) 220 MG tablet Take 440 mg by mouth daily as needed (pain/headache).   Yes [provider]  nitroGLYCERIN (NITROSTAT) 0.4 MG SL tablet Place 1 tablet (0.4 mg total) under the tongue every 5 (five) minutes as needed for  chest pain. 04/26/18  Yes Ross, Paula V, MD  pantoprazole (PROTONIX) 40 MG tablet Take 1 tablet (40 mg total) by mouth 2 (two) times daily. For 8 weeks, then transition to once daily Patient taking differently: Take 40 mg by mouth See admin   instructions. 02/20/2020: take one tablet (40 mg) by mouth twice daily, then take one tablet (40 mg) once daily 02/20/20 04/20/20 Yes Powell, A Caldwell Jr., MD  polyethylene glycol (MIRALAX / GLYCOLAX) 17 g packet Take 17 g by mouth 2 (two) times daily for 14 days. Once you have a regular bowel movement daily, can decrease to once daily Patient taking differently: Take 17 g by mouth See admin instructions. 02/20/2020 - mix 17 g in 4-8 oz liquid and drink twice daily, once you have a regular bowel movement daily, decrease to once daily 02/20/20 03/05/20 Yes Powell, A Caldwell Jr., MD  atorvastatin (LIPITOR) 80 MG tablet TAKE 1 TABLET BY MOUTH DAILY. Patient taking differently: Take 80 mg by mouth daily.  07/15/19   Ross, Paula V, MD  ciprofloxacin (CIPRO) 250 MG tablet Take 1 tablet (250 mg total) by mouth 2 (two) times daily for 5 days. 02/24/20 02/29/20  Pahwani, Ravi, MD  furosemide (LASIX) 40 MG tablet Take 2 tablets (80 mg total) by mouth daily. 02/26/20   Pahwani, Ravi, MD  glucose blood (ONETOUCH VERIO) test strip Use TID 10/03/12   Jones, Thomas L, MD  insulin NPH Human (NOVOLIN N RELION) 100 UNIT/ML injection Inject 0.2 mLs (20 Units total) into the skin 2 (two) times daily before a meal. 02/24/20   Pahwani, Ravi, MD  ondansetron (ZOFRAN-ODT) 4 MG disintegrating tablet Take 1 tablet (4 mg total) by mouth every 8 (eight) hours as needed for nausea or vomiting. 02/24/20   Pahwani, Ravi, MD  potassium chloride (KLOR-CON) 10 MEQ tablet Take 1 tablet (10 mEq total) by mouth daily. Patient not taking: Reported on 02/19/2020 02/04/20   Ross, Paula V, MD  spironolactone (ALDACTONE) 25 MG tablet Take 1 tablet (25 mg total) by mouth daily. 02/26/20   Pahwani, Ravi, MD    Current  Facility-Administered Medications  Medication Dose Route Frequency Provider Last Rate Last Admin  . acetaminophen (TYLENOL) tablet 650 mg  650 mg Oral Q6H PRN Tu, Ching T, DO      . aspirin EC tablet 81 mg  81 mg Oral Daily Tu, Ching T, DO   81 mg at 02/24/20 0907  . atorvastatin (LIPITOR) tablet 80 mg  80 mg Oral Daily Tu, Ching T, DO   80 mg at 02/24/20 0907  . carvedilol (COREG) tablet 12.5 mg  12.5 mg Oral BID WC Vann, Jessica U, DO   12.5 mg at 02/24/20 0907  . enoxaparin (LOVENOX) injection 30 mg  30 mg Subcutaneous Q24H Tu, Ching T, DO   30 mg at 02/24/20 0908  . ezetimibe (ZETIA) tablet 10 mg  10 mg Oral QHS Tu, Ching T, DO   10 mg at 02/23/20 2113  . feeding supplement (ENSURE ENLIVE) (ENSURE ENLIVE) liquid 237 mL  237 mL Oral TID BM Vann, Jessica U, DO   237 mL at 02/24/20 0908  . insulin aspart (novoLOG) injection 0-5 Units  0-5 Units Subcutaneous QHS Tu, Ching T, DO   4 Units at 02/23/20 2218  . insulin aspart (novoLOG) injection 0-9 Units  0-9 Units Subcutaneous TID WC Tu, Ching T, DO   7 Units at 02/24/20 1226  . insulin aspart (novoLOG) injection 5 Units  5 Units Subcutaneous TID WC Tu, Ching T, DO   5 Units at 02/24/20 1226  . insulin glargine (LANTUS) injection 10 Units  10 Units Subcutaneous QHS Tu, Ching T, DO   10 Units at 02/23/20 2216  . isosorbide mononitrate (IMDUR) 24 hr tablet 60   mg  60 mg Oral Daily Tu, Ching T, DO   60 mg at 02/24/20 0907  . lisinopril (ZESTRIL) tablet 20 mg  20 mg Oral Daily Vann, Jessica U, DO   20 mg at 02/24/20 0908  . ondansetron (ZOFRAN) injection 4 mg  4 mg Intravenous Q6H PRN Tu, Ching T, DO   4 mg at 02/22/20 1015  . pantoprazole (PROTONIX) EC tablet 40 mg  40 mg Oral BID AC Vann, Jessica U, DO   40 mg at 02/24/20 0907  . sodium chloride flush (NS) 0.9 % injection 10-40 mL  10-40 mL Intracatheter Q12H Tu, Ching T, DO   10 mL at 02/24/20 0908  . sodium chloride flush (NS) 0.9 % injection 10-40 mL  10-40 mL Intracatheter PRN Tu, Ching T, DO         Allergies as of 02/21/2020  . (No Known Allergies)     Review of Systems:    Constitutional: No fever or chills Skin: No rash Cardiovascular: No chest pain Respiratory: No SOB Gastrointestinal: See HPI and otherwise negative Genitourinary: No dysuria Neurological: No headache, dizziness or syncope Musculoskeletal: No new muscle or joint pain Hematologic: No bleeding Psychiatric: No history of depression or anxiety    Physical Exam:  Vital signs in last 24 hours: Temp:  [98 F (36.7 C)-98.8 F (37.1 C)] 98.2 F (36.8 C) (04/27 1329) Pulse Rate:  [57-82] 82 (04/27 1329) Resp:  [16-18] 16 (04/27 1329) BP: (120-148)/(61-76) 147/74 (04/27 1329) SpO2:  [96 %-98 %] 98 % (04/27 1329) Last BM Date: 02/22/20 General:   Pleasant elderly Caucasian female appears to be in NAD, Well developed, Well nourished, alert and cooperative Head:  Normocephalic and atraumatic. Eyes:   PEERL, EOMI. No icterus. Conjunctiva pink. Ears:  Normal auditory acuity. Neck:  Supple Throat: Oral cavity and pharynx without inflammation, swelling or lesion.  Lungs: Respirations even and unlabored. Lungs clear to auscultation bilaterally.   No wheezes, crackles, or rhonchi.  Heart: Normal S1, S2. No MRG. Regular rate and rhythm. No peripheral edema, cyanosis or pallor.  Abdomen:  Soft, nondistended, nontender. No rebound or guarding. Normal bowel sounds. No appreciable masses or hepatomegaly. Rectal:  Not performed.  Msk:  Symmetrical without gross deformities. Peripheral pulses intact.  Extremities:  Without edema, no deformity or joint abnormality.  Neurologic:  Alert and  oriented x4;  grossly normal neurologically.  Skin:   Dry and intact without significant lesions or rashes. Psychiatric: Demonstrates good judgement and reason without abnormal affect or behaviors.  LAB RESULTS: Recent Labs    02/22/20 0419 02/23/20 0329 02/24/20 0427  WBC 5.4 5.4 5.6  HGB 13.5 12.8 12.7  HCT 40.4 38.2 37.6   PLT 195 182 179   BMET Recent Labs    02/22/20 0419 02/23/20 0329 02/24/20 0427  NA 135 131* 131*  K 3.2* 4.0 4.4  CL 97* 96* 95*  CO2 29 28 28  GLUCOSE 209* 192* 309*  BUN 29* 24* 23  CREATININE 0.81 0.80 0.90  CALCIUM 8.8* 8.4* 8.5*   LFT Recent Labs    02/23/20 0329  PROT 4.4*  ALBUMIN 2.4*  AST 23  ALT 22  ALKPHOS 65  BILITOT 0.9   CT angio abdomen pelvis 02/21/2020 NON-VASCULAR  Stable exophytic left renal probable hemorrhagic cyst measuring 4.2 x 4.1 cm.  Question of mild wall thickening seen at the sigmoid rectal junction. If further evaluation is required would recommend direct visualization with colonoscopy.  Diffuse mild anasarca.  Mildly prominent partially   visualized uterus.   Impression / Plan:   Impression: 1.  Abdominal pain/esophagitis/gastritis: EGD recently with gastritis, patient came in for abdominal pain nausea and vomiting but this is now controlled and she is tolerating a soft diet 2.  Abnormal CT of the abdomen: With concerns for malignancy given a transition point in the colon 3.  Chronic combined CHF  Plan: 1.  Discussed with the family that since last colonoscopy was unsuccessful due to poor bowel prep we will need to start a two day prep now with plans for colonoscopy 02/26/2020.  Did discuss risks, benefits, limitations and alternatives and the patient agrees to proceed. 2.  Patient will take a half of movi prep today followed by a whole movi prep tomorrow in split dose fashion. 3.  Will put the patient on a clear liquid diet the rest of today and tomorrow. 4.  Plans will be for patient discharge after her colonoscopy on 02/26/2020. 5.  Please await any further recommendations from Dr. Cirigliano later today.  Thank you for your kind consultation, we will continue to follow.  Claretta Kendra Lynne Lanetra Hartley  02/24/2020, 1:55 PM   

## 2020-02-24 NOTE — Progress Notes (Addendum)
Inpatient Diabetes Program Recommendations  AACE/ADA: New Consensus Statement on Inpatient Glycemic Control (2015)  Target Ranges:  Prepandial:   less than 140 mg/dL      Peak postprandial:   less than 180 mg/dL (1-2 hours)      Critically ill patients:  140 - 180 mg/dL   Lab Results  Component Value Date   GLUCAP 312 (H) 02/24/2020   HGBA1C 15.0 (H) 02/16/2020    Review of Glycemic Control Results for JADI, WARM (MRN [id number]) as of 02/24/2020 14:00  Ref. Range 02/23/2020 15:30 02/23/2020 16:36 02/23/2020 21:32 02/24/2020 06:29 02/24/2020 10:41  Glucose-Capillary Latest Ref Range: 70 - 99 mg/dL 246 (H) 270 (H) 340 (H) 323 (H) 312 (H)  Diabetes history: DM 2 Outpatient Diabetes medications: NPH 15 units bid, Novolin R 15 units tid with meals Current orders for Inpatient glycemic control:  Novolog sensitive tid with meals and HS Novolog 5 units tid with meals Lantus 10 units q HS Inpatient Diabetes Program Recommendations:    Please consider increasing Lantus to 16 units daily.  Note that dietician spoke to patient today regarding diet.  Patient needs insulin to prevent high blood sugars and to assist her to put on weight.    Thanks  Adah Perl, RN, BC-ADM Inpatient Diabetes Coordinator Pager [phone] (8a-5p)

## 2020-02-24 NOTE — Telephone Encounter (Signed)
FAXED Hospital hold for SN to [facility] on 4/27

## 2020-02-24 NOTE — Discharge Instructions (Signed)
Nausea, Adult Nausea is feeling sick to your stomach or feeling that you are about to throw up (vomit). Feeling sick to your stomach is usually not serious, but it may be an early sign of a more serious medical problem. As you feel sicker to your stomach, you may throw up. If you throw up, or if you are not able to drink enough fluids, there is a risk that you may lose too much water in your body (get dehydrated). If you lose too much water in your body, you may:  Feel tired.  Feel thirsty.  Have a dry mouth.  Have cracked lips.  Go pee (urinate) less often. Older adults and people who have other diseases or a weak body defense system (immune system) have a higher risk of losing too much water in the body. The main goals of treating this condition are:  To relieve your nausea.  To ensure your nausea occurs less often.  To prevent throwing up and losing too much fluid. Follow these instructions at home: Watch your symptoms for any changes. Tell your doctor about them. Follow these instructions as told by your doctor. Eating and drinking      Take an ORS (oral rehydration solution). This is a drink that is sold at pharmacies and stores.  Drink clear fluids in small amounts as you are able. These include: ? Water. ? Ice chips. ? Fruit juice that has water added (diluted fruit juice). ? Low-calorie sports drinks.  Eat bland, easy-to-digest foods in small amounts as you are able, such as: ? Bananas. ? Applesauce. ? Rice. ? Low-fat (lean) meats. ? Toast. ? Crackers.  Avoid drinking fluids that have a lot of sugar or caffeine in them. This includes energy drinks, sports drinks, and soda.  Avoid alcohol.  Avoid spicy or fatty foods. General instructions  Take over-the-counter and prescription medicines only as told by your doctor.  Rest at home while you get better.  Drink enough fluid to keep your pee (urine) pale yellow.  Take slow and deep breaths when you feel  sick to your stomach.  Avoid food or things that have strong smells.  Wash your hands often with soap and water. If you cannot use soap and water, use hand sanitizer.  Make sure that all people in your home wash their hands well and often.  Keep all follow-up visits as told by your doctor. This is important. Contact a doctor if:  You feel sicker to your stomach.  You feel sick to your stomach for more than 2 days.  You throw up.  You are not able to drink fluids without throwing up.  You have new symptoms.  You have a fever.  You have a headache.  You have muscle cramps.  You have a rash.  You have pain while peeing.  You feel light-headed or dizzy. Get help right away if:  You have pain in your chest, neck, arm, or jaw.  You feel very weak or you pass out (faint).  You have throw up that is bright red or looks like coffee grounds.  You have bloody or black poop (stools) or poop that looks like tar.  You have a very bad headache, a stiff neck, or both.  You have very bad pain, cramping, or bloating in your belly (abdomen).  You have trouble breathing or you are breathing very quickly.  Your heart is beating very quickly.  Your skin feels cold and clammy.  You feel confused.    You have signs of losing too much water in your body, such as: ? Dark pee, very little pee, or no pee. ? Cracked lips. ? Dry mouth. ? Sunken eyes. ? Sleepiness. ? Weakness. These symptoms may be an emergency. Do not wait to see if the symptoms will go away. Get medical help right away. Call your local emergency services (911 in the [place] Do not drive yourself to the hospital. Summary  Nausea is feeling sick to your stomach or feeling that you are about to throw up (vomit).  If you throw up, or if you are not able to drink enough fluids, there is a risk that you may lose too much water in your body (get dehydrated).  Eat and drink what your doctor tells you. Take  over-the-counter and prescription medicines only as told by your doctor.  Contact a doctor right away if your symptoms get worse or you have new symptoms.  Keep all follow-up visits as told by your doctor. This is important. This information is not intended to replace advice given to you by your health care provider. Make sure you discuss any questions you have with your health care provider. Document Revised: 03/26/2018 Document Reviewed: 03/26/2018 Elsevier Patient Education  2020 [place] With Diabetes  Foods with carbohydrates make your blood glucose level go up. Learning how to count carbohydrates can help you control your blood glucose levels. First, identify the foods you eat that contain carbohydrates. Then, using the Foods with Carbohydrates chart, determine about how much carbohydrates are in your meals and snacks. Make sure you are eating foods with fiber, protein, and healthy fat along with your carbohydrate foods. Foods with Carbohydrates The following table shows carbohydrate foods that have about 15 grams of carbohydrate each. Using measuring cups, spoons, or a food scale when you first begin learning about carbohydrate counting can help you learn about the portion sizes you typically eat. The following foods have 15 grams carbohydrate each:  Grains . 1 slice bread (1 ounce)  . 1 small tortilla (6-inch size)  .  large bagel (1 ounce)  . 1/3 cup pasta or rice (cooked)  .  hamburger or hot dog bun ( ounce)  .  cup cooked cereal  .  to  cup ready-to-eat cereal  . 2 taco shells (5-inch size) Fruit . 1 small fresh fruit ( to 1 cup)  .  medium banana  . 17 small grapes (3 ounces)  . 1 cup melon or berries  .  cup canned or frozen fruit  . 2 tablespoons dried fruit (blueberries, cherries, cranberries, raisins)  .  cup unsweetened fruit juice  Starchy Vegetables .  cup cooked beans, peas, corn, potatoes/sweet potatoes  .   large baked potato (3 ounces)  . 1 cup acorn or butternut squash  Snack Foods . 3 to 6 crackers  . 8 potato chips or 13 tortilla chips ( ounce to 1 ounce)  . 3 cups popped popcorn  Dairy . 3/4 cup (6 ounces) nonfat plain yogurt, or yogurt with sugar-free sweetener  . 1 cup milk  . 1 cup plain rice, soy, coconut or flavored almond milk Sweets and Desserts .  cup ice cream or frozen yogurt  . 1 tablespoon jam, jelly, pancake syrup, table sugar, or honey  . 2 tablespoons light pancake syrup  . 1 inch square of frosted cake or 2 inch square of unfrosted cake  . 2 small cookies (2/3 ounce each) or  large cookie  Sometimes you'll have to estimate carbohydrate amounts if you don't know the exact recipe. One cup of mixed foods like soups can have 1 to 2 carbohydrate servings, while some casseroles might have 2 or more servings of carbohydrate. Foods that have less than 20 calories in each serving can be counted as "free" foods. Count 1 cup raw vegetables, or  cup cooked non-starchy vegetables as "free" foods. If you eat 3 or more servings at one meal, then count them as 1 carbohydrate serving.  Foods without Carbohydrates  Not all foods contain carbohydrates. Meat, some dairy, fats, non-starchy vegetables, and many beverages don't contain carbohydrate. So when you count carbohydrates, you can generally exclude chicken, pork, beef, fish, seafood, eggs, tofu, cheese, butter, sour cream, avocado, nuts, seeds, olives, mayonnaise, water, black coffee, unsweetened tea, and zero-calorie drinks. Vegetables with no or low carbohydrate include green beans, cauliflower, tomatoes, and onions. How much carbohydrate should I eat at each meal?  Carbohydrate counting can help you plan your meals and manage your weight. Following are some starting points for carbohydrate intake at each meal. Work with your registered dietitian nutritionist to find the best range that works for your blood glucose and weight.   To Lose  Weight To Maintain Weight  Women 2 - 3 carb servings 3 - 4 carb servings  Men 3 - 4 carb servings 4 - 5 carb servings  Checking your blood glucose after meals will help you know if you need to adjust the timing, type, or number of carbohydrate servings in your meal plan. Achieve and keep a healthy body weight by balancing your food intake and physical activity.  Tips How should I plan my meals?  Plan for half the food on your plate to include non-starchy vegetables, like salad greens, broccoli, or carrots. Try to eat 3 to 5 servings of non-starchy vegetables every day. Have a protein food at each meal. Protein foods include chicken, fish, meat, eggs, or beans (note that beans contain carbohydrate). These two food groups (non-starchy vegetables and proteins) are low in carbohydrate. If you fill up your plate with these foods, you will eat less carbohydrate but still fill up your stomach. Try to limit your carbohydrate portion to  of the plate.  What fats are healthiest to eat?  Diabetes increases risk for heart disease. To help protect your heart, eat more healthy fats, such as olive oil, nuts, and avocado. Eat less saturated fats like butter, cream, and high-fat meats, like bacon and sausage. Avoid trans fats, which are in all foods that list "partially hydrogenated oil" as an ingredient. What should I drink?  Choose drinks that are not sweetened with sugar. The healthiest choices are water, carbonated or seltzer waters, and tea and coffee without added sugars.  Sweet drinks will make your blood glucose go up very quickly. One serving of soda or energy drink is  cup. It is best to drink these beverages only if your blood glucose is low.  Artificially sweetened, or diet drinks, typically do not increase your blood glucose if they have zero calories in them. Read labels of beverages, as some diet drinks do have carbohydrate and will raise your blood glucose. Label Reading Tips Read Nutrition Facts  labels to find out how many grams of carbohydrate are in a food you want to eat. Don't forget: sometimes serving sizes on the label aren't the same as how much food you are going to eat, so you may need to calculate  how much carbohydrate is in the food you are serving yourself.   Carbohydrate Counting for People with Diabetes Sample 1-Day Menu  Breakfast  cup yogurt, low fat, low sugar (1 carbohydrate serving)   cup cereal, ready-to-eat, unsweetened (1 carbohydrate serving)  1 cup strawberries (1 carbohydrate serving)   cup almonds ( carbohydrate serving)  Lunch 1, 5 ounce can chunk light tuna  2 ounces cheese, low fat cheddar  6 whole wheat crackers (1 carbohydrate serving)  1 small apple (1 carbohydrate servings)   cup carrots ( carbohydrate serving)   cup snap peas  1 cup 1% milk (1 carbohydrate serving)   Evening Meal Stir fry made with: 3 ounces chicken  1 cup brown rice (3 carbohydrate servings)   cup broccoli ( carbohydrate serving)   cup green beans   cup onions  1 tablespoon olive oil  2 tablespoons teriyaki sauce ( carbohydrate serving)  Evening Snack 1 extra small banana (1 carbohydrate serving)  1 tablespoon peanut butter   Carbohydrate Counting for People with Diabetes Vegan Sample 1-Day Menu  Breakfast 1 cup cooked oatmeal (2 carbohydrate servings)   cup blueberries (1 carbohydrate serving)  2 tablespoons flaxseeds  1 cup soymilk fortified with calcium and vitamin D  1 cup coffee  Lunch 2 slices whole wheat bread (2 carbohydrate servings)   cup baked tofu   cup lettuce  2 slices tomato  2 slices avocado   cup baby carrots ( carbohydrate serving)  1 orange (1 carbohydrate serving)  1 cup soymilk fortified with calcium and vitamin D   Evening Meal Burrito made with: 1 6-inch corn tortilla (1 carbohydrate serving)  1 cup refried vegetarian beans (2 carbohydrate servings)   cup chopped tomatoes   cup lettuce   cup salsa  1/3 cup brown rice (1  carbohydrate serving)  1 tablespoon olive oil for rice   cup zucchini   Evening Snack 6 small whole grain crackers (1 carbohydrate serving)  2 apricots ( carbohydrate serving)   cup unsalted peanuts ( carbohydrate serving)    Carbohydrate Counting for People with Diabetes Vegetarian (Lacto-Ovo) Sample 1-Day Menu  Breakfast 1 cup cooked oatmeal (2 carbohydrate servings)   cup blueberries (1 carbohydrate serving)  2 tablespoons flaxseeds  1 egg  1 cup 1% milk (1 carbohydrate serving)  1 cup coffee  Lunch 2 slices whole wheat bread (2 carbohydrate servings)  2 ounces low-fat cheese   cup lettuce  2 slices tomato  2 slices avocado   cup baby carrots ( carbohydrate serving)  1 orange (1 carbohydrate serving)  1 cup unsweetened tea  Evening Meal Burrito made with: 1 6-inch corn tortilla (1 carbohydrate serving)   cup refried vegetarian beans (1 carbohydrate serving)   cup tomatoes   cup lettuce   cup salsa  1/3 cup brown rice (1 carbohydrate serving)  1 tablespoon olive oil for rice   cup zucchini  1 cup 1% milk (1 carbohydrate serving)  Evening Snack 6 small whole grain crackers (1 carbohydrate serving)  2 apricots ( carbohydrate serving)   cup unsalted peanuts ( carbohydrate serving)    Copyright 2020  [employer]. All rights reserved.  Using Nutrition Labels: Carbohydrate  . Serving Size  . Look at the serving size. All the information on the label is based on this portion. Randol Kern Per Container  . The number of servings contained in the package. . Guidelines for Carbohydrate  . Look at the total grams of carbohydrate in  the serving size.  . 1 carbohydrate choice = 15 grams of carbohydrate. Range of Carbohydrate Grams Per Choice  Carbohydrate Grams/Choice Carbohydrate Choices  6-10   11-20 1  21-25 1  26-35 2  36-40 2  41-50 3  51-55 3  56-65 4  66-70 4  71-80 5    Copyright 2020  [employer]  [employer]. All rights reserved.

## 2020-02-24 NOTE — Consult Note (Signed)
Consultation  Referring Provider: Dr. Doristine Bosworth    Primary Care Physician:  McLean-Scocuzza, Nino Glow, MD Primary Gastroenterologist: Althia Forts Reason for Consultation:   Abnormal CT of the colon         HPI:   Marisa Forbes is a 69 y.o. female with a past medical history as listed below including CHF, combined EF 25-30%, MI 2013, cardiac stent 2013, CVA 2013 post cardiac cath, renal artery stenosis, left breast cancer treated with lumpectomy, radiation, chemo 2014, and others whom we are consulted on for a colonic mass.    02/19/2020 patient consulted by our service for colitis with diarrhea.  At that time was noted she had been admitted 02/14/2020-02/16/2020 with hypertensive encephalopathy, glucose 354, presented with bilious vomiting, acute onset upper abdominal pain suspected acute gastroenteritis.  02/14/2020 CTAP without contrast showed possible wall thickening the cecum, transverse colon and rectum.  Colonoscopy was recommended to rule out underlying neoplasm.  At time of last consult patient had ongoing anorexia since being discharged from the hospital with occasional vomiting of bilious material.  She was diagnosed with colitis with concern for underlying neoplasm at the level of the splenic flexure.  Patient was set up for an EGD and colonoscopy the next morning.    02/20/2020 colonoscopy with Dr. Lyndel Safe, but the procedure was aborted due to inadequate bowel prep, abnormal digital exam showing rectal impaction, she was manually disimpacted.  Told return to GI clinic in 2 to 3 weeks to have an outpatient colonoscopy.  EGD that day with LA grade B reflux esophagitis with no bleeding, small hiatal hernia, gastritis and normal duodenum.  Patient was put on pantoprazole 40 p.o. twice daily x8 weeks, then once daily.  Patient was set up for outpatient colonoscopy on May 18.      02/21/2020 patient return to the hospital for intractable abdominal pain, nausea and vomiting.  She was diagnosed with  possibly worsening gastritis/esophagitis.  Patient was treated symptomatically with antiemetics and started feeling better.  She is tolerating a soft diet.  Plans were for discharge on ciprofloxacin given a UTI and colonoscopy outpatient but the patient's husband does not feel like she can prep at home so he is wanting her to stay here and have colonoscopy inpatient.    Today, the patient was found with her son by her bedside.  He reiterates that there is no way that they could possibly prep her at home for colonoscopy.  It is just him in her and he cannot lift her.  She explains that she is feeling much better, no longer nauseous or vomiting and her abdominal pain is much better.  She believes that she could prep for a colonoscopy.    Denies fever or chills.  Past Medical History:  Diagnosis Date  . Arthritis    back  . Balance problems    due to stroke  . Benign head tremor    per pt due to muscle spasm in neck   . Breast cancer ([place]) 12/03/12   a. 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 S/p chemo with CMF and radiation.  . Cataracts, bilateral   . CHF (congestive heart failure) ([place])    On Monday she said her cardiologist was concerned for possible CHF, but is now improving-per pt.(07/26/15)  . Coronary artery disease    a. STEMI 08/2012: s/p DES to LAD, PTCA to downstream LAD. b. Relook cath same hospitalization: stable post-PCI anatomy with Stable Diag lesions (  unlikely cause of resting angina, not good PCI targets), stable RCA lesion (non flow limiting).  . Diabetes mellitus without complication ([place])    Takes insulin and metformin  . Dysplasia of cervix, low grade (CIN 1)    in 02 s/p LEEP   . GERD (gastroesophageal reflux disease)    food related  . History of cancer chemotherapy    last in August 2014  . History of echocardiogram    Echo 2/19: Mild concentric LVH, moderate focal basal septal hypertrophy, EF 45-50, apical akinesis (?apical  pseudoaneurysm), anteroseptal akinesis, grade 1 diastolic dysfunction, MAC  . History of radiation therapy 06/23/2013-08/08/2013   62.4 gray to left breast  . HTN (hypertension)   . Hypercholesteremia   . Insomnia   . Left kidney mass   . LV dysfunction    a. EF 45-50% at time of STEMI 08/2012. b. Echo 04/2013: EF 45-50%, mild focal basal hypertrophy of septum, grade 1 d/d.  Marland Kitchen Myocardial infarction ([place]) 09/07/12  . Peripheral vision loss    left  . Perirectal abscess    11/2018   . Pleural effusion, bilateral   . Pneumonia   . Renal artery stenosis ([facility])    a. 20% L RAS in 08/2012 by cath. b. Duplex 07/2012: >60% L RAS.  . Stroke, embolic ([place])    a. 41/6606 post cath. with residual reduced vision in left eye; noted right occiptal lobe/PCA infarct   . UTI (urinary tract infection)   . UTI (urinary tract infection)   . Vasovagal syncope    a. 04/2013 - echo stable compared to prior EF 45-50%, negative carotid duplex.    Past Surgical History:  Procedure Laterality Date  . BACK SURGERY     x3, lower back  . BIOPSY  02/20/2020   Procedure: BIOPSY;  Surgeon: Jackquline Denmark, MD;  Location: [facility] ENDOSCOPY;  Service: Endoscopy;;  . BREAST BIOPSY Left 12/03/2012   U/S Core- Malignant  . BREAST LUMPECTOMY Left 01/08/2013  . BREAST LUMPECTOMY WITH NEEDLE LOCALIZATION AND AXILLARY SENTINEL LYMPH NODE BX Left 01/08/2013   Procedure: LEFT BREAST WIRE GUIDED  LUMPECTOMY AND LEFT AXILLARY SENTINEL  NODE BX;  Surgeon: Rolm Bookbinder, MD;  Location: [place];  Service: General;  Laterality: Left;  . CATARACT EXTRACTION Bilateral   . CERVICAL BIOPSY  W/ LOOP ELECTRODE EXCISION     02/2001  . CORONARY ANGIOPLASTY WITH STENT PLACEMENT    . ESOPHAGOGASTRODUODENOSCOPY (EGD) WITH PROPOFOL N/A 02/20/2020   Procedure: ESOPHAGOGASTRODUODENOSCOPY (EGD) WITH PROPOFOL;  Surgeon: Jackquline Denmark, MD;  Location: [facility] ENDOSCOPY;  Service: Endoscopy;  Laterality: N/A;  . EYE SURGERY     b/l cataract   . IMPACTION  REMOVAL  02/20/2020   Procedure: IMPACTION REMOVAL;  Surgeon: Jackquline Denmark, MD;  Location: [facility] ENDOSCOPY;  Service: Endoscopy;;  Manual Stool Disimpaction  . INCISION AND DRAINAGE PERIRECTAL ABSCESS N/A 12/11/2018   Procedure: IRRIGATION AND DEBRIDEMENT PERIRECTAL ABSCESS;  Surgeon: Johnathan Hausen, MD;  Location: [facility] ORS;  Service: General;  Laterality: N/A;  . LEFT HEART CATHETERIZATION WITH CORONARY ANGIOGRAM N/A 09/07/2012   Procedure: LEFT HEART CATHETERIZATION WITH CORONARY ANGIOGRAM;  Surgeon: Troy Sine, MD;  Location: [facility] CATH LAB;  Service: Cardiovascular;  Laterality: N/A;  . LEFT HEART CATHETERIZATION WITH CORONARY ANGIOGRAM  09/09/2012   Procedure: LEFT HEART CATHETERIZATION WITH CORONARY ANGIOGRAM;  Surgeon: Leonie Man, MD;  Location: [facility] CATH LAB;  Service: Cardiovascular;;  . PERCUTANEOUS CORONARY STENT INTERVENTION (PCI-S) N/A 09/07/2012   Procedure: PERCUTANEOUS CORONARY STENT INTERVENTION (  PCI-S);  Surgeon: Troy Sine, MD;  Location: [facility] CATH LAB;  Service: Cardiovascular;  Laterality: N/A;  . PORT-A-CATH REMOVAL N/A 10/14/2013   Procedure: REMOVAL PORT-A-CATH;  Surgeon: Rolm Bookbinder, MD;  Location: [facility] ORS;  Service: General;  Laterality: N/A;  . PORTACATH PLACEMENT Right 02/17/2013   Procedure: INSERTION PORT-A-CATH;  Surgeon: Rolm Bookbinder, MD;  Location: [facility];  Service: General;  Laterality: Right;  . THORACENTESIS     12/13/2018 atypical cells not definitely diag. of malignancy     Family History  Problem Relation Age of Onset  . COPD Mother   . Heart disease Father   . Hypertension Father   . Diabetes Brother   . Hypertension Brother   . Learning disabilities Brother   . Hypertension Brother   . Asthma Daughter   . Cancer Neg Hx   . Hyperlipidemia Neg Hx   . Kidney disease Neg Hx   . Stroke Neg Hx     Social History   Tobacco Use  . Smoking status: Never Smoker  . Smokeless tobacco: Never Used  Substance Use Topics  . Alcohol use: Not  Currently  . Drug use: No    Prior to Admission medications   Medication Sig Start Date End Date Taking? Authorizing Provider  aspirin 81 MG EC tablet Take 1 tablet (81 mg total) by mouth daily. 04/16/15  Yes Barton Dubois, MD  carvedilol (COREG) 25 MG tablet TAKE 1 TABLET BY MOUTH TWICE A DAY WITH MEALS Patient taking differently: Take 25 mg by mouth 2 (two) times daily with a meal.  07/14/19  Yes Fay Records, MD  ezetimibe (ZETIA) 10 MG tablet TAKE 1 TABLET BY MOUTH EVERY DAY Patient taking differently: Take 10 mg by mouth at bedtime.  07/15/19  Yes Fay Records, MD  feeding supplement, ENSURE ENLIVE, (ENSURE ENLIVE) LIQD Take 237 mLs by mouth 2 (two) times daily as needed (If PO intakes of meals are poor). Patient taking differently: Take 237 mLs by mouth every other day as needed (if meal intake is poor).  12/14/18  Yes Irene Pap N, DO  hydrALAZINE (APRESOLINE) 10 MG tablet Take 1 tablet (10 mg total) by mouth 2 (two) times daily as needed. For BP >130/>80 Patient taking differently: Take 10 mg by mouth 2 (two) times daily as needed (For BP >130/>80).  12/25/18  Yes McLean-Scocuzza, Nino Glow, MD  insulin regular (NOVOLIN R RELION) 100 units/mL injection Inject 0.1-0.15 mLs (10-15 Units total) into the skin 3 (three) times daily before meals. Patient taking differently: Inject 15 Units into the skin 3 (three) times daily before meals.  11/04/19  Yes Philemon Kingdom, MD  isosorbide mononitrate (IMDUR) 60 MG 24 hr tablet TAKE 1 TABLET BY MOUTH DAILY. Patient taking differently: Take 60 mg by mouth daily.  07/14/19  Yes Fay Records, MD  lisinopril (ZESTRIL) 40 MG tablet Take 1 tablet (40 mg total) by mouth daily. 02/16/20  Yes Jennye Boroughs, MD  naproxen sodium (ALEVE) 220 MG tablet Take 440 mg by mouth daily as needed (pain/headache).   Yes [provider]  nitroGLYCERIN (NITROSTAT) 0.4 MG SL tablet Place 1 tablet (0.4 mg total) under the tongue every 5 (five) minutes as needed for  chest pain. 04/26/18  Yes Fay Records, MD  pantoprazole (PROTONIX) 40 MG tablet Take 1 tablet (40 mg total) by mouth 2 (two) times daily. For 8 weeks, then transition to once daily Patient taking differently: Take 40 mg by mouth See admin  instructions. 02/20/2020: take one tablet (40 mg) by mouth twice daily, then take one tablet (40 mg) once daily 02/20/20 04/20/20 Yes Elodia Florence., MD  polyethylene glycol (MIRALAX / GLYCOLAX) 17 g packet Take 17 g by mouth 2 (two) times daily for 14 days. Once you have a regular bowel movement daily, can decrease to once daily Patient taking differently: Take 17 g by mouth See admin instructions. 02/20/2020 - mix 17 g in 4-8 oz liquid and drink twice daily, once you have a regular bowel movement daily, decrease to once daily 02/20/20 03/05/20 Yes Elodia Florence., MD  atorvastatin (LIPITOR) 80 MG tablet TAKE 1 TABLET BY MOUTH DAILY. Patient taking differently: Take 80 mg by mouth daily.  07/15/19   Fay Records, MD  ciprofloxacin (CIPRO) 250 MG tablet Take 1 tablet (250 mg total) by mouth 2 (two) times daily for 5 days. 02/24/20 02/29/20  Darliss Cheney, MD  furosemide (LASIX) 40 MG tablet Take 2 tablets (80 mg total) by mouth daily. 02/26/20   Darliss Cheney, MD  glucose blood (ONETOUCH VERIO) test strip Use TID 10/03/12   Janith Lima, MD  insulin NPH Human (NOVOLIN N RELION) 100 UNIT/ML injection Inject 0.2 mLs (20 Units total) into the skin 2 (two) times daily before a meal. 02/24/20   Darliss Cheney, MD  ondansetron (ZOFRAN-ODT) 4 MG disintegrating tablet Take 1 tablet (4 mg total) by mouth every 8 (eight) hours as needed for nausea or vomiting. 02/24/20   Darliss Cheney, MD  potassium chloride (KLOR-CON) 10 MEQ tablet Take 1 tablet (10 mEq total) by mouth daily. Patient not taking: Reported on 02/19/2020 02/04/20   Fay Records, MD  spironolactone (ALDACTONE) 25 MG tablet Take 1 tablet (25 mg total) by mouth daily. 02/26/20   Darliss Cheney, MD    Current  Facility-Administered Medications  Medication Dose Route Frequency Provider Last Rate Last Admin  . acetaminophen (TYLENOL) tablet 650 mg  650 mg Oral Q6H PRN Tu, Ching T, DO      . aspirin EC tablet 81 mg  81 mg Oral Daily Tu, Ching T, DO   81 mg at 02/24/20 0907  . atorvastatin (LIPITOR) tablet 80 mg  80 mg Oral Daily Tu, Ching T, DO   80 mg at 02/24/20 0907  . carvedilol (COREG) tablet 12.5 mg  12.5 mg Oral BID WC Vann, Jessica U, DO   12.5 mg at 02/24/20 0907  . enoxaparin (LOVENOX) injection 30 mg  30 mg Subcutaneous Q24H Tu, Ching T, DO   30 mg at 02/24/20 0908  . ezetimibe (ZETIA) tablet 10 mg  10 mg Oral QHS Tu, Ching T, DO   10 mg at 02/23/20 2113  . feeding supplement (ENSURE ENLIVE) (ENSURE ENLIVE) liquid 237 mL  237 mL Oral TID BM Vann, Jessica U, DO   237 mL at 02/24/20 0908  . insulin aspart (novoLOG) injection 0-5 Units  0-5 Units Subcutaneous QHS Tu, Ching T, DO   4 Units at 02/23/20 2218  . insulin aspart (novoLOG) injection 0-9 Units  0-9 Units Subcutaneous TID WC Tu, Ching T, DO   7 Units at 02/24/20 1226  . insulin aspart (novoLOG) injection 5 Units  5 Units Subcutaneous TID WC Tu, Ching T, DO   5 Units at 02/24/20 1226  . insulin glargine (LANTUS) injection 10 Units  10 Units Subcutaneous QHS Tu, Ching T, DO   10 Units at 02/23/20 2216  . isosorbide mononitrate (IMDUR) 24 hr tablet 60  mg  60 mg Oral Daily Tu, Ching T, DO   60 mg at 02/24/20 4098  . lisinopril (ZESTRIL) tablet 20 mg  20 mg Oral Daily Eulogio Bear U, DO   20 mg at 02/24/20 0908  . ondansetron (ZOFRAN) injection 4 mg  4 mg Intravenous Q6H PRN Tu, Ching T, DO   4 mg at 02/22/20 1015  . pantoprazole (PROTONIX) EC tablet 40 mg  40 mg Oral BID AC Vann, Jessica U, DO   40 mg at 02/24/20 0907  . sodium chloride flush (NS) 0.9 % injection 10-40 mL  10-40 mL Intracatheter Q12H Tu, Ching T, DO   10 mL at 02/24/20 0908  . sodium chloride flush (NS) 0.9 % injection 10-40 mL  10-40 mL Intracatheter PRN Tu, Ching T, DO         Allergies as of 02/21/2020  . (No Known Allergies)     Review of Systems:    Constitutional: No fever or chills Skin: No rash Cardiovascular: No chest pain Respiratory: No SOB Gastrointestinal: See HPI and otherwise negative Genitourinary: No dysuria Neurological: No headache, dizziness or syncope Musculoskeletal: No new muscle or joint pain Hematologic: No bleeding Psychiatric: No history of depression or anxiety    Physical Exam:  Vital signs in last 24 hours: Temp:  [98 F (36.7 C)-98.8 F (37.1 C)] 98.2 F (36.8 C) (04/27 1329) Pulse Rate:  [57-82] 82 (04/27 1329) Resp:  [16-18] 16 (04/27 1329) BP: (120-148)/(61-76) 147/74 (04/27 1329) SpO2:  [96 %-98 %] 98 % (04/27 1329) Last BM Date: 02/22/20 General:   Pleasant elderly Caucasian female appears to be in NAD, Well developed, Well nourished, alert and cooperative Head:  Normocephalic and atraumatic. Eyes:   PEERL, EOMI. No icterus. Conjunctiva pink. Ears:  Normal auditory acuity. Neck:  Supple Throat: Oral cavity and pharynx without inflammation, swelling or lesion.  Lungs: Respirations even and unlabored. Lungs clear to auscultation bilaterally.   No wheezes, crackles, or rhonchi.  Heart: Normal S1, S2. No MRG. Regular rate and rhythm. No peripheral edema, cyanosis or pallor.  Abdomen:  Soft, nondistended, nontender. No rebound or guarding. Normal bowel sounds. No appreciable masses or hepatomegaly. Rectal:  Not performed.  Msk:  Symmetrical without gross deformities. Peripheral pulses intact.  Extremities:  Without edema, no deformity or joint abnormality.  Neurologic:  Alert and  oriented x4;  grossly normal neurologically.  Skin:   Dry and intact without significant lesions or rashes. Psychiatric: Demonstrates good judgement and reason without abnormal affect or behaviors.  LAB RESULTS: Recent Labs    02/22/20 0419 02/23/20 0329 02/24/20 0427  WBC 5.4 5.4 5.6  HGB 13.5 12.8 12.7  HCT 40.4 38.2 37.6   PLT 195 182 179   BMET Recent Labs    02/22/20 0419 02/23/20 0329 02/24/20 0427  NA 135 131* 131*  K 3.2* 4.0 4.4  CL 97* 96* 95*  CO2 _0 GLUCOSE 209* 192* 309*  BUN 29* 24* 23  CREATININE 0.81 0.80 0.90  CALCIUM 8.8* 8.4* 8.5*   LFT Recent Labs    02/23/20 0329  PROT 4.4*  ALBUMIN 2.4*  AST 23  ALT 22  ALKPHOS 65  BILITOT 0.9   CT angio abdomen pelvis 02/21/2020 NON-VASCULAR  Stable exophytic left renal probable hemorrhagic cyst measuring 4.2 x 4.1 cm.  Question of mild wall thickening seen at the sigmoid rectal junction. If further evaluation is required would recommend direct visualization with colonoscopy.  Diffuse mild anasarca.  Mildly prominent partially  visualized uterus.   Impression / Plan:   Impression: 1.  Abdominal pain/esophagitis/gastritis: EGD recently with gastritis, patient came in for abdominal pain nausea and vomiting but this is now controlled and she is tolerating a soft diet 2.  Abnormal CT of the abdomen: With concerns for malignancy given a transition point in the colon 3.  Chronic combined CHF  Plan: 1.  Discussed with the family that since last colonoscopy was unsuccessful due to poor bowel prep we will need to start a two day prep now with plans for colonoscopy 02/26/2020.  Did discuss risks, benefits, limitations and alternatives and the patient agrees to proceed. 2.  Patient will take a half of movi prep today followed by a whole movi prep tomorrow in split dose fashion. 3.  Will put the patient on a clear liquid diet the rest of today and tomorrow. 4.  Plans will be for patient discharge after her colonoscopy on 02/26/2020. 5.  Please await any further recommendations from Dr. Bryan Lemma later today.  Thank you for your kind consultation, we will continue to follow.  Marisa Forbes Robie Oats  02/24/2020, 1:55 PM

## 2020-02-24 NOTE — Telephone Encounter (Signed)
FAXED Resumption of Care form to [name] on  02/24/20 Verbal date [id number] 8am Cert date [id number]

## 2020-02-24 NOTE — Progress Notes (Signed)
Nutrition Follow-up  DOCUMENTATION CODES:   Severe malnutrition in context of chronic illness, Underweight  INTERVENTION:  -Provided diabetic diet education   -Continue Ensure Enlive po TID, each supplement provides 350 kcal and 20 grams of protein  -Continue to encourage adequate PO intake  NUTRITION DIAGNOSIS:   Severe Malnutrition related to chronic illness(CHF) as evidenced by moderate fat depletion, severe fat depletion, moderate muscle depletion, severe muscle depletion.  Ongoing.  GOAL:   Patient will meet greater than or equal to 90% of their needs  Progressing.  MONITOR:   PO intake, Supplement acceptance, Labs, Weight trends, Skin  REASON FOR ASSESSMENT:   Malnutrition Screening Tool    ASSESSMENT:   69 year old female who presented on 4/24 with intractable abdominal pain, N/V. PMH of HTN, T2DM, CHF, CVA, CAD, HLD, breast cancer.  Discussed pt with RN.   Pt reports appetite is good and that she is enjoying the Ensure supplements and drinking them in their entirety. RD discussed diabetic diet with pt and provided handout.  PO Intake: 100% x 1 recorded meal Pt receiving Ensure Enlive TID  Medications reviewed and include: Novolog, Lantus Labs reviewed: Na 131 (L), CBGs [id number] (Diabetes Coordinator following)  UOP: 657ml x24 hours I/O: -129ml since admit  Diet Order:   Diet Order            DIET SOFT Room service appropriate? Yes; Fluid consistency: Thin  Diet effective now              EDUCATION NEEDS:   Education needs have been addressed  Skin:  Skin Assessment: Skin Integrity Issues: Skin Integrity Issues:: Stage I Stage I: coccyx  Last BM:  4/25  Height:   Ht Readings from Last 1 Encounters:  02/21/20 5\' 5"  (1.651 m)    Weight:   Wt Readings from Last 1 Encounters:  02/22/20 46 kg    BMI:  Body mass index is 16.88 kg/m.  Estimated Nutritional Needs:   Kcal:  1500-1700  Protein:  70-85 grams  Fluid:  >/= 1.5  L    Larkin Ina, MS, RD, LDN RD pager number and weekend/on-call pager number located in [place].

## 2020-02-25 ENCOUNTER — Encounter (HOSPITAL_COMMUNITY): Payer: Self-pay | Admitting: Family Medicine

## 2020-02-25 ENCOUNTER — Other Ambulatory Visit: Payer: Self-pay

## 2020-02-25 DIAGNOSIS — I5042 Chronic combined systolic (congestive) and diastolic (congestive) heart failure: Secondary | ICD-10-CM

## 2020-02-25 LAB — GLUCOSE, CAPILLARY
Glucose-Capillary: 162 mg/dL — ABNORMAL HIGH (ref 70–99)
Glucose-Capillary: 344 mg/dL — ABNORMAL HIGH (ref 70–99)
Glucose-Capillary: 188 mg/dL — ABNORMAL HIGH (ref 70–99)
Glucose-Capillary: 133 mg/dL — ABNORMAL HIGH (ref 70–99)

## 2020-02-25 MED ORDER — CIPROFLOXACIN IN D5W 400 MG/200ML IV SOLN
400.0000 mg | Freq: Two times a day (BID) | INTRAVENOUS | Status: DC
  Administered 2020-02-25 – 2020-02-26 (×3): 400 mg via INTRAVENOUS
  Filled 2020-02-25 (×3): qty 200

## 2020-02-25 MED ORDER — INSULIN GLARGINE 100 UNIT/ML ~~LOC~~ SOLN
20.0000 [IU] | Freq: Every day | SUBCUTANEOUS | Status: DC
  Filled 2020-02-25 (×2): qty 0.2

## 2020-02-25 NOTE — Progress Notes (Signed)
PT Cancellation Note  Patient Details Name: Marisa [facility] MRN: [id number] DOB: May 15, 1951   Cancelled Treatment:    Reason Eval/Treat Not Completed: Other (comment).  Pt was unable to perform tx earlier as she had a meal, then declined over prepping for colonoscopy.  Will follow up as time and pt allow.   Ramond Dial 02/25/2020, 1:09 PM   Mee Hives, PT MS Acute Rehab Dept. Number: [place] [facility] [place]

## 2020-02-25 NOTE — Progress Notes (Addendum)
PROGRESS NOTE    Marisa Forbes  TUU:[id number] DOB: 10/02/1951 DOA: 02/21/2020 PCP: McLean-Scocuzza, Nino Glow, MD   Brief Narrative:  Marisa Forbes an 69 y.o.femalewith medical history significant forHx of HTN, uncontrolled insulin-dependent Type 2 diabetes, combined CHF, CVA, CAD, HLD and hx of breast cancer who presented with intractable abdominal pain, nausea and vomiting.  Shewas just [facility] and observed overnight on 4/22 for syncope due to hypotension and abdominal pain and decrease PO intake. Upon returning home she tried to eat but then started to vomiting all her food. Emesis was non-bloody but somewhat bilious. She also noted constant dull epigastric pain that was 8/10 at its worse.  She returned to the ED.  Her pain had already resolved in the ED.  Patient was once again admitted under hospital service.  She was kept n.p.o. with pain medications.  CTA done in the ED showed moderate to large amount of stool in the right colon and cecum.  No acute pathology.  She was continued on PPI for her recent diagnosis of esophagitis/gastritis.  Patient was treated symptomatically with antiemetics.  Patient improved and was ready and agreeable for discharge so she was discharged on the morning of 02/24/2020 however her husband came to the hospital few hours later and objected to the discharge due to the fact that he was not willing/claimed of being capable to help her bowel prep at home as he would have needed to take her to the bathroom for frequent bowel movements and thus he demanded to do colonoscopy here and call GI.  Per his demand, discharge was canceled and GI was consulted.  Assessment & Plan:   Active Problems:   CAD (coronary artery disease)   Poorly controlled type 2 diabetes mellitus with circulatory disorder (HCC)   Chronic combined systolic (congestive) and diastolic (congestive) heart failure (HCC)   Hyperglycemia due to type 2 diabetes mellitus (HCC)   Intractable  vomiting with nausea   Pressure injury of skin   Protein-calorie malnutrition, severe   Constipation   Abnormal finding on GI tract imaging   abdominal pain/esophagitis/gastritis  -Multiple hospitalizations/ER visits in the last month possibly worsening gastritis/esophagitis but there is concerns for malignancy on CT abdomen scan on 4/17. Unfortunately colonoscopy was unsuccessful at last admission due to inadequate bowel prep CTA done in the ER shows moderate to large amount of stool in the right colon and cecum.  Continue twice daily PPI.  Patient has not had any vomiting since last more than 2 days.  Tolerating soft diet and now down to clear liquid diet.  She was discharged yesterday however due to her husband's objection as detailed above, discharge was canceled and GI consultation was obtained.  Patient is being prepped for colonoscopy scheduled tomorrow.  Chronic combined CHF : Euvolemic.  Continue current management.  ?  UTI: No urinary complaints but UA is highly indicative of possible UTI and she did have abdominal pain so I will start her on ciprofloxacin and wait for final urine culture.  Insulin dependent Type 2 diabetes: Recent hemoglobin A1c about a week ago was 15.  Currently on 16 units of Lantus and NovoLog 5 units 3 times daily along with SSI but sugars are still high.  Will increase Lantus to 20 units and continue rest of the management.  Diabetes educator on board.  CAD no issues. stable continue Imdur, statin and Zetia  Hypokalemia: Resolved  Essential hypertension: Controlled.  Continue current medications which is carvedilol, lisinopril and Imdur.  Hyperlipidemia: Continue atorvastatin.  DVT prophylaxis: Lovenox   Code Status: DNR  Family Communication:  None present at bedside.  Plan of care discussed with patient in length and he verbalized understanding and agreed with it. Patient is from: Home Disposition Plan: Home likely tomorrow Barriers to  discharge: Schedule colonoscopy tomorrow  Status is: Inpatient  Remains inpatient appropriate because:Ongoing diagnostic testing needed not appropriate for outpatient work up   Dispo: The patient is from: Home              Anticipated d/c is to: Home              Anticipated d/c date is: 1 day              Patient currently is not medically stable to d/c.         Estimated body mass index is 16.88 kg/m as calculated from the following:   Height as of this encounter: 5' 5"  (1.651 m).   Weight as of this encounter: 46 kg.  Pressure Injury 02/22/20 Coccyx Posterior;Mid Stage 1 -  Intact skin with non-blanchable redness of a localized area usually over a bony prominence. (Active)  02/22/20 0309  Location: Coccyx  Location Orientation: Posterior;Mid  Staging: Stage 1 -  Intact skin with non-blanchable redness of a localized area usually over a bony prominence.  Wound Description (Comments):   Present on Admission: Yes     Nutritional status:  Nutrition Problem: Severe Malnutrition Etiology: chronic illness(CHF)   Signs/Symptoms: moderate fat depletion, severe fat depletion, moderate muscle depletion, severe muscle depletion   Interventions: Ensure Enlive (each supplement provides 350kcal and 20 grams of protein)    Consultants:   GI  Procedures:   None  Antimicrobials:  Anti-infectives (From admission, onward)   Start     Dose/Rate Route Frequency Ordered Stop   02/24/20 0000  ciprofloxacin (CIPRO) 250 MG tablet     250 mg Oral 2 times daily 02/24/20 1000 02/29/20 2359         Subjective: Patient seen and examined.  She has no complaints.  Tolerating diet.  Objective: Vitals:   02/24/20 1329 02/24/20 1740 02/24/20 2237 02/25/20 0600  BP: (!) 147/74 (!) 147/83 118/69 (!) 154/88  Pulse: 82 77 73 75  Resp: 16 16 16 20   Temp: 98.2 F (36.8 C) 98.4 F (36.9 C) 98.6 F (37 C) 98.3 F (36.8 C)  TempSrc: Oral Oral Oral Oral  SpO2: 98% 92% 96% 97%    Weight:      Height:        Intake/Output Summary (Last 24 hours) at 02/25/2020 1051 Last data filed at 02/25/2020 0400 Gross per 24 hour  Intake 1320 ml  Output 2301 ml  Net -981 ml   Filed Weights   02/21/20 1214 02/22/20 0600  Weight: 45.4 kg 46 kg    Examination:  General exam: Appears calm and comfortable  Respiratory system: Clear to auscultation. Respiratory effort normal. Cardiovascular system: S1 & S2 heard, RRR. No JVD, murmurs, rubs, gallops or clicks. No pedal edema. Gastrointestinal system: Abdomen is nondistended, soft and nontender. No organomegaly or masses felt. Normal bowel sounds heard. Central nervous system: Alert and oriented. No focal neurological deficits. Extremities: Symmetric 5 x 5 power. Skin: No rashes, lesions or ulcers Psychiatry: Judgement and insight appear normal. Mood & affect appropriate.    Data Reviewed: I have personally reviewed following labs and imaging studies  CBC: Recent Labs  Lab 02/19/20 1100 02/19/20 1100 02/20/20 0555  02/21/20 1927 02/22/20 0419 02/23/20 0329 02/24/20 0427  WBC 5.7   < > 9.1 7.8 5.4 5.4 5.6  NEUTROABS 4.1  --  6.4  --   --   --   --   HGB 15.2*   < > 16.3* 16.6* 13.5 12.8 12.7  HCT 45.6   < > 47.6* 49.6* 40.4 38.2 37.6  MCV 86.2   < > 86.1 86.9 86.5 86.0 86.0  PLT 257   < > 259 243 195 182 179   < > = values in this interval not displayed.   Basic Metabolic Panel: Recent Labs  Lab 02/20/20 0555 02/21/20 1927 02/22/20 0419 02/23/20 0329 02/24/20 0427  NA 136 133* 135 131* 131*  K 3.0* 3.8 3.2* 4.0 4.4  CL 95* 92* 97* 96* 95*  CO2 28 29 29 28 28   GLUCOSE 91 240* 209* 192* 309*  BUN 45* 33* 29* 24* 23  CREATININE 0.84 0.98 0.81 0.80 0.90  CALCIUM 9.6 9.7 8.8* 8.4* 8.5*   GFR: Estimated Creatinine Clearance: 42.8 mL/min (by C-G formula based on SCr of 0.9 mg/dL). Liver Function Tests: Recent Labs  Lab 02/19/20 1644 02/21/20 1927 02/23/20 0329  AST 15 30 23   ALT 13 24 22   ALKPHOS 53  93 65  BILITOT 0.9 1.6* 0.9  PROT 3.8* 5.9* 4.4*  ALBUMIN 2.1* 3.3* 2.4*   Recent Labs  Lab 02/19/20 1644 02/21/20 1927  LIPASE 61* 67*   No results for input(s): AMMONIA in the last 168 hours. Coagulation Profile: No results for input(s): INR, PROTIME in the last 168 hours. Cardiac Enzymes: No results for input(s): CKTOTAL, CKMB, CKMBINDEX, TROPONINI in the last 168 hours. BNP (last 3 results) Recent Labs    01/14/20 1103 02/02/20 1356 02/11/20 1258  PROBNP 5,532* 5,273* 1,733*   HbA1C: No results for input(s): HGBA1C in the last 72 hours. CBG: Recent Labs  Lab 02/24/20 0629 02/24/20 1041 02/24/20 1618 02/24/20 2113 02/25/20 0641  GLUCAP 323* 312* 229* 236* 162*   Lipid Profile: No results for input(s): CHOL, HDL, LDLCALC, TRIG, CHOLHDL, LDLDIRECT in the last 72 hours. Thyroid Function Tests: No results for input(s): TSH, T4TOTAL, FREET4, T3FREE, THYROIDAB in the last 72 hours. Anemia Panel: No results for input(s): VITAMINB12, FOLATE, FERRITIN, TIBC, IRON, RETICCTPCT in the last 72 hours. Sepsis Labs: Recent Labs  Lab 02/21/20 1927 02/22/20 0419  LATICACIDVEN 1.5 1.2    Recent Results (from the past 240 hour(s))  Respiratory Panel by RT PCR (Flu A&B, Covid) - Nasopharyngeal Swab     Status: None   Collection Time: 02/19/20  1:42 PM   Specimen: Nasopharyngeal Swab  Result Value Ref Range Status   SARS Coronavirus 2 by RT PCR NEGATIVE NEGATIVE Final    Comment: (NOTE) SARS-CoV-2 target nucleic acids are NOT DETECTED. The SARS-CoV-2 RNA is generally detectable in upper respiratoy specimens during the acute phase of infection. The lowest concentration of SARS-CoV-2 viral copies this assay can detect is 131 copies/mL. A negative result does not preclude SARS-Cov-2 infection and should not be used as the sole basis for treatment or other patient management decisions. A negative result may occur with  improper specimen collection/handling, submission of  specimen other than nasopharyngeal swab, presence of viral mutation(s) within the areas targeted by this assay, and inadequate number of viral copies (<131 copies/mL). A negative result must be combined with clinical observations, patient history, and epidemiological information. The expected result is Negative. Fact Sheet for Patients:  PinkCheek.be Fact Sheet for Healthcare  Providers:  GravelBags.it This test is not yet ap proved or cleared by the [place] and  has been authorized for detection and/or diagnosis of SARS-CoV-2 by FDA under an Emergency Use Authorization (EUA). This EUA will remain  in effect (meaning this test can be used) for the duration of the COVID-19 declaration under Section 564(b)(1) of the Act, 21 U.S.C. section 360bbb-3(b)(1), unless the authorization is terminated or revoked sooner.    Influenza A by PCR NEGATIVE NEGATIVE Final   Influenza B by PCR NEGATIVE NEGATIVE Final    Comment: (NOTE) The Xpert Xpress SARS-CoV-2/FLU/RSV assay is intended as an aid in  the diagnosis of influenza from Nasopharyngeal swab specimens and  should not be used as a sole basis for treatment. Nasal washings and  aspirates are unacceptable for Xpert Xpress SARS-CoV-2/FLU/RSV  testing. Fact Sheet for Patients: PinkCheek.be Fact Sheet for Healthcare Providers: GravelBags.it This test is not yet approved or cleared by the [place] FDA and  has been authorized for detection and/or diagnosis of SARS-CoV-2 by  FDA under an Emergency Use Authorization (EUA). This EUA will remain  in effect (meaning this test can be used) for the duration of the  Covid-19 declaration under Section 564(b)(1) of the Act, 21  U.S.C. section 360bbb-3(b)(1), unless the authorization is  terminated or revoked. Performed at [facility], [place] [address], [place],  [place] [address]   Respiratory Panel by RT PCR (Flu A&B, Covid) - Nasopharyngeal Swab     Status: None   Collection Time: 02/22/20  1:03 AM   Specimen: Nasopharyngeal Swab  Result Value Ref Range Status   SARS Coronavirus 2 by RT PCR NEGATIVE NEGATIVE Final    Comment: (NOTE) SARS-CoV-2 target nucleic acids are NOT DETECTED. The SARS-CoV-2 RNA is generally detectable in upper respiratoy specimens during the acute phase of infection. The lowest concentration of SARS-CoV-2 viral copies this assay can detect is 131 copies/mL. A negative result does not preclude SARS-Cov-2 infection and should not be used as the sole basis for treatment or other patient management decisions. A negative result may occur with  improper specimen collection/handling, submission of specimen other than nasopharyngeal swab, presence of viral mutation(s) within the areas targeted by this assay, and inadequate number of viral copies (<131 copies/mL). A negative result must be combined with clinical observations, patient history, and epidemiological information. The expected result is Negative. Fact Sheet for Patients:  PinkCheek.be Fact Sheet for Healthcare Providers:  GravelBags.it This test is not yet ap proved or cleared by the [place] FDA and  has been authorized for detection and/or diagnosis of SARS-CoV-2 by FDA under an Emergency Use Authorization (EUA). This EUA will remain  in effect (meaning this test can be used) for the duration of the COVID-19 declaration under Section 564(b)(1) of the Act, 21 U.S.C. section 360bbb-3(b)(1), unless the authorization is terminated or revoked sooner.    Influenza A by PCR NEGATIVE NEGATIVE Final   Influenza B by PCR NEGATIVE NEGATIVE Final    Comment: (NOTE) The Xpert Xpress SARS-CoV-2/FLU/RSV assay is intended as an aid in  the diagnosis of influenza from Nasopharyngeal swab specimens and  should not be used  as a sole basis for treatment. Nasal washings and  aspirates are unacceptable for Xpert Xpress SARS-CoV-2/FLU/RSV  testing. Fact Sheet for Patients: PinkCheek.be Fact Sheet for Healthcare Providers: GravelBags.it This test is not yet approved or cleared by the [place] FDA and  has been authorized for detection and/or diagnosis of SARS-CoV-2  by  FDA under an Emergency Use Authorization (EUA). This EUA will remain  in effect (meaning this test can be used) for the duration of the  Covid-19 declaration under Section 564(b)(1) of the Act, 21  U.S.C. section 360bbb-3(b)(1), unless the authorization is  terminated or revoked. Performed at [facility], [place] [address] [place], [place] [address]       Radiology Studies: No results found.  Scheduled Meds: . aspirin EC  81 mg Oral Daily  . atorvastatin  80 mg Oral Daily  . carvedilol  12.5 mg Oral BID WC  . [START ON 02/27/2020] enoxaparin (LOVENOX) injection  30 mg Subcutaneous Q24H  . ezetimibe  10 mg Oral QHS  . feeding supplement (ENSURE ENLIVE)  237 mL Oral TID BM  . insulin aspart  0-5 Units Subcutaneous QHS  . insulin aspart  0-9 Units Subcutaneous TID WC  . insulin aspart  5 Units Subcutaneous TID WC  . insulin glargine  16 Units Subcutaneous QHS  . isosorbide mononitrate  60 mg Oral Daily  . lisinopril  20 mg Oral Daily  . pantoprazole  40 mg Oral BID AC  . peg 3350 powder  1 kit Oral Once  . sodium chloride flush  10-40 mL Intracatheter Q12H   Continuous Infusions:   LOS: 4 days   Time spent: 33 minutes   Darliss Cheney, MD [facility]  02/25/2020, 10:51 AM   To contact the attending provider between 7A-7P or the covering provider during after hours 7P-7A, please log into the web site [email or web address].

## 2020-02-26 ENCOUNTER — Encounter (HOSPITAL_COMMUNITY)
Admission: EM | Disposition: A | Discharge status: Home Health | Payer: Self-pay | Source: Home / Self Care | Attending: Family Medicine

## 2020-02-26 ENCOUNTER — Inpatient Hospital Stay (HOSPITAL_COMMUNITY): Payer: Medicare Other | Admitting: Certified Registered Nurse Anesthetist

## 2020-02-26 ENCOUNTER — Encounter (HOSPITAL_COMMUNITY): Payer: Self-pay | Admitting: Family Medicine

## 2020-02-26 DIAGNOSIS — D123 Benign neoplasm of transverse colon: Secondary | ICD-10-CM

## 2020-02-26 DIAGNOSIS — E43 Unspecified severe protein-calorie malnutrition: Secondary | ICD-10-CM

## 2020-02-26 DIAGNOSIS — K635 Polyp of colon: Secondary | ICD-10-CM

## 2020-02-26 DIAGNOSIS — K621 Rectal polyp: Secondary | ICD-10-CM

## 2020-02-26 HISTORY — PX: COLONOSCOPY WITH PROPOFOL: SHX5780

## 2020-02-26 HISTORY — PX: POLYPECTOMY: SHX5525

## 2020-02-26 LAB — BASIC METABOLIC PANEL
Anion gap: 8 (ref 5–15)
BUN: 10 mg/dL (ref 8–23)
CO2: 27 mmol/L (ref 22–32)
Calcium: 8 mg/dL — ABNORMAL LOW (ref 8.9–10.3)
Chloride: 100 mmol/L (ref 98–111)
Creatinine, Ser: 0.5 mg/dL (ref 0.44–1.00)
GFR calc Af Amer: >60 mL/min (ref >60)
GFR calc non Af Amer: >60 mL/min (ref >60)
Glucose, Bld: 245 mg/dL — ABNORMAL HIGH (ref 70–99)
Potassium: 4 mmol/L (ref 3.5–5.1)
Sodium: 135 mmol/L (ref 135–145)

## 2020-02-26 LAB — CBC WITH DIFFERENTIAL/PLATELET
Abs Immature Granulocytes: 0.02 10*3/uL (ref 0.00–0.07)
Basophils Absolute: 0 10*3/uL (ref 0.0–0.1)
Basophils Relative: 0 %
Eosinophils Absolute: 0.1 10*3/uL (ref 0.0–0.5)
Eosinophils Relative: 3 %
HCT: 38 % (ref 36.0–46.0)
Hemoglobin: 13.1 g/dL (ref 12.0–15.0)
Immature Granulocytes: 0 %
Lymphocytes Relative: 18 %
Lymphs Abs: 1 10*3/uL (ref 0.7–4.0)
MCH: 29.5 pg (ref 26.0–34.0)
MCHC: 34.5 g/dL (ref 30.0–36.0)
MCV: 85.6 fL (ref 80.0–100.0)
Monocytes Absolute: 0.5 10*3/uL (ref 0.1–1.0)
Monocytes Relative: 10 %
Neutro Abs: 3.7 10*3/uL (ref 1.7–7.7)
Neutrophils Relative %: 69 %
Platelets: 166 10*3/uL (ref 150–400)
RBC: 4.44 MIL/uL (ref 3.87–5.11)
RDW: 13.2 % (ref 11.5–15.5)
WBC: 5.4 10*3/uL (ref 4.0–10.5)
nRBC: 0 % (ref 0.0–0.2)

## 2020-02-26 LAB — GLUCOSE, CAPILLARY
Glucose-Capillary: 281 mg/dL — ABNORMAL HIGH (ref 70–99)
Glucose-Capillary: 210 mg/dL — ABNORMAL HIGH (ref 70–99)
Glucose-Capillary: 209 mg/dL — ABNORMAL HIGH (ref 70–99)

## 2020-02-26 SURGERY — COLONOSCOPY WITH PROPOFOL
Anesthesia: Monitor Anesthesia Care

## 2020-02-26 MED ORDER — INSULIN NPH (HUMAN) (ISOPHANE) 100 UNIT/ML ~~LOC~~ SUSP: 25.0000 [IU] | Freq: Two times a day (BID) | SUBCUTANEOUS | 11 refills | Status: AC

## 2020-02-26 MED ORDER — PROPOFOL 500 MG/50ML IV EMUL
INTRAVENOUS | Status: DC | PRN
  Administered 2020-02-26: 75 ug/kg/min via INTRAVENOUS

## 2020-02-26 MED ORDER — SODIUM CHLORIDE 0.9 % IV SOLN: INTRAVENOUS | Status: DC

## 2020-02-26 MED ORDER — INSULIN ASPART 100 UNIT/ML ~~LOC~~ SOLN
0.0000 [IU] | SUBCUTANEOUS | Status: DC
  Administered 2020-02-26: 3 [IU] via SUBCUTANEOUS
  Administered 2020-02-26: 5 [IU] via SUBCUTANEOUS

## 2020-02-26 MED ORDER — PROPOFOL 10 MG/ML IV BOLUS
INTRAVENOUS | Status: DC | PRN
  Administered 2020-02-26: 30 mg via INTRAVENOUS

## 2020-02-26 MED ORDER — LIDOCAINE HCL (CARDIAC) PF 100 MG/5ML IV SOSY
PREFILLED_SYRINGE | INTRAVENOUS | Status: DC | PRN
  Administered 2020-02-26: 80 mg via INTRAVENOUS

## 2020-02-26 MED ORDER — INSULIN GLARGINE 100 UNIT/ML ~~LOC~~ SOLN
10.0000 [IU] | Freq: Every day | SUBCUTANEOUS | Status: DC
  Filled 2020-02-26: qty 0.1

## 2020-02-26 MED ORDER — CARVEDILOL 12.5 MG PO TABS: ORAL_TABLET | ORAL | 0 refills | Status: DC

## 2020-02-26 MED ORDER — LACTATED RINGERS IV SOLN: INTRAVENOUS | Status: DC

## 2020-02-26 SURGICAL SUPPLY — 22 items

## 2020-02-26 NOTE — Anesthesia Postprocedure Evaluation (Signed)
Anesthesia Post Note  Patient: OSCEOLA HOLIAN  Procedure(s) Performed: COLONOSCOPY WITH PROPOFOL (N/A ) POLYPECTOMY     Patient location during evaluation: Endoscopy Anesthesia Type: MAC Level of consciousness: awake and alert Pain management: pain level controlled Vital Signs Assessment: post-procedure vital signs reviewed and stable Respiratory status: spontaneous breathing, nonlabored ventilation and respiratory function stable Cardiovascular status: stable and blood pressure returned to baseline Postop Assessment: no apparent nausea or vomiting Anesthetic complications: no    Last Vitals:  Vitals:   02/26/20 1000 02/26/20 1010  BP: (!) 145/71 (!) 150/69  Pulse: 67 68  Resp: 15 18  Temp:    SpO2: 100% 100%    Last Pain:  Vitals:   02/26/20 1010  TempSrc:   PainSc: 0-No pain                 Jacori Mulrooney,W. EDMOND

## 2020-02-26 NOTE — Anesthesia Preprocedure Evaluation (Addendum)
Anesthesia Evaluation  Patient identified by MRN, date of birth, ID band Patient awake    Reviewed: Allergy & Precautions, H&P , NPO status , Patient's Chart, lab work & pertinent test results, reviewed documented beta blocker date and time   Airway Mallampati: II  TM Distance: >3 FB Neck ROM: Full    Dental no notable dental hx. (+) Poor Dentition, Dental Advisory Given   Pulmonary neg pulmonary ROS,    Pulmonary exam normal breath sounds clear to auscultation       Cardiovascular hypertension, Pt. on medications and Pt. on home beta blockers + CAD, + Past MI, + Cardiac Stents, + Peripheral Vascular Disease and +CHF   Rhythm:Regular Rate:Normal     Neuro/Psych CVA negative psych ROS   GI/Hepatic Neg liver ROS, GERD  Medicated and Controlled,  Endo/Other  diabetes, Insulin Dependent  Renal/GU negative Renal ROS  negative genitourinary   Musculoskeletal  (+) Arthritis , Osteoarthritis,    Abdominal   Peds  Hematology negative hematology ROS (+)   Anesthesia Other Findings   Reproductive/Obstetrics negative OB ROS                            Anesthesia Physical Anesthesia Plan  ASA: III  Anesthesia Plan: MAC   Post-op Pain Management:    Induction: Intravenous  PONV Risk Score and Plan: 2 and Propofol infusion and Treatment may vary due to age or medical condition  Airway Management Planned: Simple Face Mask  Additional Equipment:   Intra-op Plan:   Post-operative Plan:   Informed Consent: I have reviewed the patients History and Physical, chart, labs and discussed the procedure including the risks, benefits and alternatives for the proposed anesthesia with the patient or authorized representative who has indicated his/her understanding and acceptance.   Patient has DNR.  Discussed DNR with patient and Suspend DNR.   Dental advisory given  Plan Discussed with:  CRNA  Anesthesia Plan Comments:        Anesthesia Quick Evaluation

## 2020-02-26 NOTE — Transfer of Care (Signed)
Immediate Anesthesia Transfer of Care Note  Patient: Marisa Forbes  Procedure(s) Performed: COLONOSCOPY WITH PROPOFOL (N/A ) POLYPECTOMY  Patient Location: PACU and Endoscopy Unit  Anesthesia Type:MAC  Level of Consciousness: drowsy  Airway & Oxygen Therapy: Patient Spontanous Breathing and Patient connected to nasal cannula oxygen  Post-op Assessment: Report given to RN, Post -op Vital signs reviewed and stable and Patient moving all extremities  Post vital signs: Reviewed and stable  Last Vitals:  Vitals Value Taken Time  BP 126/64 02/26/20 0950  Temp 36 C 02/26/20 0950  Pulse 66 02/26/20 0952  Resp 18 02/26/20 0952  SpO2 100 % 02/26/20 0952  Vitals shown include unvalidated device data.  Last Pain:  Vitals:   02/26/20 0950  TempSrc: Axillary  PainSc:          Complications: No apparent anesthesia complications

## 2020-02-26 NOTE — Interval H&P Note (Signed)
History and Physical Interval Note:  02/26/2020 8:57 AM  Marisa Forbes  has presented today for surgery, with the diagnosis of Abnormal Ct abdomen.  The various methods of treatment have been discussed with the patient and family. After consideration of risks, benefits and other options for treatment, the patient has consented to  Procedure(s): COLONOSCOPY WITH PROPOFOL (N/A) as a surgical intervention.  The patient's history has been reviewed, patient examined, no change in status, stable for surgery.  I have reviewed the patient's chart and labs.  Questions were answered to the patient's satisfaction.     [name] [name]

## 2020-02-26 NOTE — Progress Notes (Signed)
Pt given discharged summary and discharged home via husband as transportation.

## 2020-02-26 NOTE — Op Note (Signed)
[facility] Patient Name: Marisa Forbes Procedure Date : 02/26/2020 MRN: [id number] Attending MD: Gerrit Heck , MD Date of Birth: 1951-08-29 CSN: [id number] Age: 69 Admit Type: Inpatient Procedure:                Colonoscopy Indications:              This is the patient's first colonoscopy, Abnormal                            CT of the GI tract                           CT with possible thickening in the cecum,                            transverse colon and rectum and subsequent CTA with                            possible rectosigmoid thickening. Providers:                Gerrit Heck, MD, Benetta Spar RN, RN, Laverda Sorenson, Technician, Raphael Gibney, CRNA Referring MD:              Medicines:                Monitored Anesthesia Care Complications:            No immediate complications. Estimated Blood Loss:     Estimated blood loss was minimal. Procedure:                Pre-Anesthesia Assessment:                           - Prior to the procedure, a History and Physical                            was performed, and patient medications and                            allergies were reviewed. The patient's tolerance of                            previous anesthesia was also reviewed. The risks                            and benefits of the procedure and the sedation                            options and risks were discussed with the patient.                            All questions were answered, and informed consent  was obtained. Prior Anticoagulants: The patient has                            taken no previous anticoagulant or antiplatelet                            agents. ASA Grade Assessment: III - A patient with                            severe systemic disease. After reviewing the risks                            and benefits, the patient was deemed in                            satisfactory  condition to undergo the procedure.                           After obtaining informed consent, the colonoscope                            was passed under direct vision. Throughout the                            procedure, the patient's blood pressure, pulse, and                            oxygen saturations were monitored continuously. The                            [id number] Olympus pediatric colonoscope                            was introduced through the anus and advanced to the                            the cecum, identified by appendiceal orifice and                            ileocecal valve. The colonoscopy was technically                            difficult and complex due to poor bowel prep. The                            patient tolerated the procedure well. The quality                            of the bowel preparation was poor. The ileocecal                            valve, appendiceal orifice, and rectum were  photographed. Scope In: 9:20:45 AM Scope Out: 9:41:10 AM Scope Withdrawal Time: 0 hours 9 minutes 54 seconds  Total Procedure Duration: 0 hours 20 minutes 25 seconds  Findings:      Hemorrhoids were found on perianal exam.      A 15 mm polyp was found in the transverse colon. The polyp was       semi-sessile. The polyp was removed with a hot snare. Resection and       retrieval were complete. Estimated blood loss: none.      A 15 mm polyp was found in the rectum. The polyp was pedunculated. The       polyp was removed with a hot snare. Resection and retrieval were       complete. Estimated blood loss: none.      A few small-mouthed diverticula were found in the sigmoid colon.      Non-bleeding internal hemorrhoids were found during retroflexion. The       hemorrhoids were medium-sized. The anal verge was patulous.      A large amount of semi-liquid solid stool and solid food debris was       found throughout the entire  colon, interfering with visualization.       Lavage of the area was performed using copious amounts of sterile water,       resulting in incomplete clearance with fair visualization. Impression:               - Preparation of the colon was poor.                           - Hemorrhoids found on perianal exam.                           - One 15 mm polyp in the transverse colon, removed                            with a hot snare. Resected and retrieved.                           - One 15 mm polyp in the rectum, removed with a hot                            snare. Resected and retrieved.                           - Diverticulosis in the sigmoid colon.                           - Non-bleeding internal hemorrhoids.                           - Stool in the entire examined colon. Recommendation:           - Return patient to hospital ward for possible                            discharge same day.                           -  Resume previous diet today.                           - Continue present medications.                           - Await pathology results.                           - Repeat colonoscopy in 3-6 months because the                            bowel preparation was poor.                           - Again plan for extended bowel prep, with clears                            x2 days, dulcolax 20 mg BID x3 days prior, and                            bowel prep for 2 consecutive evenings.                           - Return to GI clinic at appointment to be                            scheduled. Procedure Code(s):        --- Professional ---                           [phone], Colonoscopy, flexible; with removal of                            tumor(s), polyp(s), or other lesion(s) by snare                            technique Diagnosis Code(s):        --- Professional ---                           K64.8, Other hemorrhoids                           K63.5, Polyp of colon                            K62.1, Rectal polyp                           K57.30, Diverticulosis of large intestine without                            perforation or abscess without bleeding                           R93.3, Abnormal findings on diagnostic imaging  of                            other parts of digestive tract CPT copyright 2019 [employer]. All rights reserved. The codes documented in this report are preliminary and upon coder review may  be revised to meet current compliance requirements. Gerrit Heck, MD 02/26/2020 9:53:50 AM Number of Addenda: 0

## 2020-02-26 NOTE — Discharge Summary (Addendum)
Physician Discharge Summary  Marisa Forbes [id number] DOB: 1951-01-05 DOA: 02/21/2020  PCP: McLean-Scocuzza, Nino Glow, MD  Admit date: 02/21/2020 Discharge date: 02/26/2020  Admitted From: Home Disposition: Home  Recommendations for Outpatient Follow-up:  1. Follow up with PCP in 1-2 weeks 2. Follow with GI as already scheduled 3. Please obtain BMP/CBC in one week 4. Please follow up on the following pending results:  Home Health: Yes Equipment/Devices: None  Discharge Condition: Stable CODE STATUS: DNR Diet recommendation: Soft/cardiac  Subjective: Patient seen and examined.  She feels much better.  She has been tolerating soft diet very well without having any nausea vomiting or any abdominal pain.  Last time she had vomiting was the night before.  She is agreeable to go home.  Brief/Interim Summary: Marisa Forbes is an 69 y.o. female  with medical history significant forHx of HTN, uncontrolled insulin-dependent Type 2 diabetes, combined CHF, CVA, CAD, HLD and hx of breast cancer who presented with intractable abdominal pain, nausea and vomiting.  Shewas just [facility] and observed overnight on 4/22 for syncope due to hypotension and abdominal pain and decrease PO intake. Upon returning home she tried to eat but then started to vomiting all her food. Emesis was non-bloody but somewhat bilious. She also noted constant dull epigastric pain that was 8/10 at its worse.  She returned to the ED.  Her pain had already resolved in the ED.  Patient was once again admitted under hospital service.  She was kept n.p.o. with pain medications.  CTA done in the ED showed moderate to large amount of stool in the right colon and cecum.  No acute pathology.  She was continued on PPI for her recent diagnosis of esophagitis/gastritis.  Patient was treated symptomatically with antiemetics.  She was feeling better and did not have any nausea or vomiting for more than a day ago so she was initially  discharged on 02/24/2020 however her husband showed up to the hospital few hours later and he objected to the discharge stating that he is not capable of taking care of her and wanted her to have colonoscopy here as he claimed that he is not capable of helping patient go to the bathroom frequently while she will be having bowel prep at home and that she will likely lose bowels on her way to the bathroom which was not acceptable to the husband so per his demand, GI was consulted and discharge was canceled and eventually patient had colonoscopy today.  According to my discussion and my review of the procedure note of GI, patient did not have any mass and states she had 2 polyps which are removed.  She has been cleared from GI to go home.  She did not have any symptoms this morning when I saw her prior to going for colonoscopy.  I called patient's husband over the phone at about 10:30 AM today and informed him about the procedure and the plan to discharge her home.  He was agreeable with the plan today.  Patient is being discharged in stable condition.  Due to having normal or low blood pressure, her carvedilol was reduced here to 12.5 mg twice daily from 25 mg twice daily and thus she is being discharged on reduced dose of carvedilol. Of note, due to continued abdominal pain but no specific urinary complaints, UA was obtained which was indicative of UTI for which patient was started on ciprofloxacin and she is being discharged on ciprofloxacin as well.   Discharge  Diagnoses:  Active Problems:   CAD (coronary artery disease)   Poorly controlled type 2 diabetes mellitus with circulatory disorder (HCC)   Chronic combined systolic (congestive) and diastolic (congestive) heart failure (HCC)   Hyperglycemia due to type 2 diabetes mellitus (HCC)   Intractable vomiting with nausea   Pressure injury of skin   Protein-calorie malnutrition, severe   Constipation   Abnormal finding on GI tract imaging   Adenomatous  polyp of transverse colon   Polyp of rectum    Discharge Instructions   Allergies as of 02/26/2020   No Known Allergies     Medication List    TAKE these medications   aspirin 81 MG EC tablet Take 1 tablet (81 mg total) by mouth daily.   atorvastatin 80 MG tablet Commonly known as: LIPITOR TAKE 1 TABLET BY MOUTH DAILY.   carvedilol 12.5 MG tablet Commonly known as: COREG TAKE 1 TABLET BY MOUTH TWICE A DAY WITH MEALS What changed: medication strength   ciprofloxacin 250 MG tablet Commonly known as: CIPRO Take 1 tablet (250 mg total) by mouth 2 (two) times daily for 5 days.   ezetimibe 10 MG tablet Commonly known as: ZETIA TAKE 1 TABLET BY MOUTH EVERY DAY What changed:   how much to take  how to take this  when to take this  additional instructions   feeding supplement (ENSURE ENLIVE) Liqd Take 237 mLs by mouth 2 (two) times daily as needed (If PO intakes of meals are poor). What changed:   when to take this  reasons to take this   furosemide 40 MG tablet Commonly known as: LASIX Take 2 tablets (80 mg total) by mouth daily.   glucose blood test strip Commonly known as: OneTouch Verio Use TID   hydrALAZINE 10 MG tablet Commonly known as: APRESOLINE Take 1 tablet (10 mg total) by mouth 2 (two) times daily as needed. For BP >130/>80 What changed:   reasons to take this  additional instructions   insulin NPH Human 100 UNIT/ML injection Commonly known as: NovoLIN N ReliOn Inject 0.25 mLs (25 Units total) into the skin 2 (two) times daily before a meal. What changed: how much to take   insulin regular 100 units/mL injection Commonly known as: NovoLIN R ReliOn Inject 0.1-0.15 mLs (10-15 Units total) into the skin 3 (three) times daily before meals. What changed: how much to take   isosorbide mononitrate 60 MG 24 hr tablet Commonly known as: IMDUR TAKE 1 TABLET BY MOUTH DAILY.   lisinopril 40 MG tablet Commonly known as: ZESTRIL Take 1 tablet  (40 mg total) by mouth daily.   naproxen sodium 220 MG tablet Commonly known as: ALEVE Take 440 mg by mouth daily as needed (pain/headache).   nitroGLYCERIN 0.4 MG SL tablet Commonly known as: NITROSTAT Place 1 tablet (0.4 mg total) under the tongue every 5 (five) minutes as needed for chest pain.   ondansetron 4 MG disintegrating tablet Commonly known as: ZOFRAN-ODT Take 1 tablet (4 mg total) by mouth every 8 (eight) hours as needed for nausea or vomiting.   pantoprazole 40 MG tablet Commonly known as: PROTONIX Take 1 tablet (40 mg total) by mouth 2 (two) times daily. For 8 weeks, then transition to once daily What changed:   when to take this  additional instructions   polyethylene glycol 17 g packet Commonly known as: MIRALAX / GLYCOLAX Take 17 g by mouth 2 (two) times daily for 14 days. Once you have a regular bowel movement  daily, can decrease to once daily What changed:   when to take this  additional instructions   potassium chloride 10 MEQ tablet Commonly known as: KLOR-CON Take 1 tablet (10 mEq total) by mouth daily.   spironolactone 25 MG tablet Commonly known as: ALDACTONE Take 1 tablet (25 mg total) by mouth daily.      Follow-up Information    McLean-Scocuzza, Nino Glow, MD Follow up in 1 week(s).   Specialty: Internal Medicine Contact information: [address] [place] [address] [phone]        Fay Records, MD .   Specialty: Cardiology Contact information: [address] [place] [address] [phone]          No Known Allergies  Consultations: GI   Procedures/Studies: CT ABDOMEN PELVIS WO CONTRAST  Result Date: 02/14/2020 CLINICAL DATA:  Nausea and vomiting. EXAM: CT ABDOMEN AND PELVIS WITHOUT CONTRAST TECHNIQUE: Multidetector CT imaging of the abdomen and pelvis was performed following the standard protocol without IV contrast. COMPARISON:  Abdominal CT 12/11/2018, MRI 12/12/2018 FINDINGS: Lower  chest: Cardiomegaly. Small left pleural effusion. There are coronary artery calcifications. Hepatobiliary: No focal hepatic lesion on noncontrast exam. Layering hyperdensity in the gallbladder may be sludge or stones. No pericholecystic inflammation. No biliary dilatation. Pancreas: No ductal dilatation or inflammation. Spleen: Normal in size without focal abnormality. Adrenals/Urinary Tract: Adrenal thickening without dominant nodule. No hydronephrosis. No significant perinephric edema. Complex lesion in the lower pole left kidney measures 4.4 x 3.9 x 5.7 cm, not significantly changed from prior exam. This is previously characterized on MRI as hemorrhagic cyst. The urinary bladder is distended. No bladder wall thickening. Stomach/Bowel: Circumferential rectal wall thickening. Previous perirectal fluid collection no longer seen. No definite perirectal edema. Questionable wall thickening of the cecum. Formed stool in the ascending colon. Transverse colon is decompressed with equivocal wall thickening. Transition from decompressed transverse colon to stool-filled colon in the left abdomen, best appreciated on coronal series 5, image 26. Moderate stool in the distal transverse, descending, and sigmoid colon. Small bowel is decompressed and not well assessed. Fluid-filled stomach which is slightly prominent, but similar to prior exam. Appendix not confidently visualized. Vascular/Lymphatic: Aortic atherosclerosis. Peripheral branch atherosclerosis. No aortic aneurysm. No bulky abdominopelvic adenopathy. Reproductive: Uterus is slightly prominent but otherwise unremarkable. There periuterine vascular calcifications. No suspicious adnexal mass. Other: Generalized body wall edema. No definite ascites. No free air. Musculoskeletal: Bones are diffusely under mineralized. Scoliosis with multilevel degenerative change in the spine. There are no acute or suspicious osseous abnormalities. IMPRESSION: 1. Areas of possible colonic  wall thickening involving the cecum, transverse colon, as well as rectum. Query segmental colitis. Transition from dilated to nondilated colon at the splenic flexure, recommend direct visualization with colonoscopy to exclude underlying colonic neoplasm. 2. Distended urinary bladder. 3. Layering hyperdensity in the gallbladder may be sludge or stones. No pericholecystic inflammation. 4. Stable complex left lower pole renal lesion, previously characterized on MRI as hemorrhagic cyst. 5. Small left pleural effusion. Diffuse body wall edema suggest third-spacing. Aortic Atherosclerosis (ICD10-I70.0). Electronically Signed   By: Keith Rake M.D.   On: 02/14/2020 23:47   CT HEAD WO CONTRAST  Result Date: 02/14/2020 CLINICAL DATA:  Headache, nausea vomiting, hypertension EXAM: CT HEAD WITHOUT CONTRAST TECHNIQUE: Contiguous axial images were obtained from the base of the skull through the vertex without intravenous contrast. COMPARISON:  12/11/2018 FINDINGS: Brain: Hypodensities throughout the periventricular and subcortical white matter are again noted, most prominent in the right occipital  lobe, consistent with chronic ischemic change. No sign of acute infarct or hemorrhage. Lateral ventricles and midline structures are grossly unremarkable. Chronic right thalamic lacunar infarct again noted. No acute extra-axial fluid collections. No mass effect. Vascular: No hyperdense vessel or unexpected calcification. Skull: Normal. Negative for fracture or focal lesion. Sinuses/Orbits: There is opacification of the left sphenoid sinus. Remaining paranasal sinuses are clear. Other: None. IMPRESSION: 1. Chronic ischemic changes throughout the periventricular and subcortical white matter. 2. No acute intracranial process. 3. Left sphenoid sinus disease. Electronically Signed   By: Randa Ngo M.D.   On: 02/14/2020 18:56   MR BRAIN WO CONTRAST  Result Date: 02/15/2020 CLINICAL DATA:  Encephalopathy EXAM: MRI HEAD  WITHOUT CONTRAST TECHNIQUE: Multiplanar, multiecho pulse sequences of the brain and surrounding structures were obtained without intravenous contrast. COMPARISON:  Brain MRI 12/22/2019 FINDINGS: Brain: No acute infarct, acute hemorrhage or extra-axial collection. Early confluent hyperintense T2-weighted signal of the periventricular and deep white matter, most commonly due to chronic ischemic microangiopathy. Old right occipital lobe infarct site with encephalomalacia unchanged. Normal volume of CSF spaces. Old right cerebellar and thalamic small vessel infarct. Multifocal chronic microhemorrhage in a predominantly central distribution, consistent with chronic hypertensive angiopathy. Normal midline structures. Vascular: Normal flow voids. Skull and upper cervical spine: Normal marrow signal. Sinuses/Orbits: Chronic sphenoid sinus disease.  Normal orbits. Other: None. IMPRESSION: 1. No acute intracranial abnormality. 2. Old right occipital lobe infarct, right cerebellar and right thalamic infarcts. 3. Findings of chronic hypertensive angiopathy. Electronically Signed   By: Ulyses Jarred M.D.   On: 02/15/2020 03:22   DG CHEST PORT 1 VIEW  Result Date: 02/14/2020 CLINICAL DATA:  Altered mental status. Nausea and vomiting. EXAM: PORTABLE CHEST 1 VIEW COMPARISON:  05/29/2019 FINDINGS: Patient is rotated. Upper normal heart size. Unchanged mediastinal contours allowing for rotation. Obscuration of left hemidiaphragm likely small pleural effusion. No focal airspace disease. No pulmonary edema. No pneumothorax. Bones are under mineralized. Surgical clips project over the left breast. IMPRESSION: Obscuration of left hemidiaphragm likely small pleural effusion. Borderline cardiomegaly. Electronically Signed   By: Keith Rake M.D.   On: 02/14/2020 23:14   CT Angio Abd/Pel W and/or Wo Contrast  Result Date: 02/21/2020 CLINICAL DATA:  Upper abdominal pain and 30 pound weight loss EXAM: CTA ABDOMEN AND PELVIS  WITHOUT AND WITH CONTRAST TECHNIQUE: Multidetector CT imaging of the abdomen and pelvis was performed using the standard protocol during bolus administration of intravenous contrast. Multiplanar reconstructed images and MIPs were obtained and reviewed to evaluate the vascular anatomy. CONTRAST:  128mL OMNIPAQUE IOHEXOL 350 MG/ML SOLN COMPARISON:  February 14, 2020, MRI abdomen December 12, 2018 FINDINGS: CTA ABDOMEN AND PELVIS FINDINGS VASCULAR Aorta: Normal caliber aorta without aneurysm, dissection, vasculitis or hemodynamically significant stenosis. There is also scattered aortic atherosclerosis. Celiac: No aneurysm, dissection or hemodynamically significant stenosis. Normal branching pattern SMA: Widely patent without dissection or stenosis. Renals: Single renal arteries bilaterally. Mild stenosis at the origin of the renal arteries due to atherosclerosis. No fibromuscular dysplasia or aneurysm. IMA: Scattered atherosclerosis at the origin of the bilateral iliacs without stenosis. Inflow: No aneurysm, stenosis or dissection. Veins: Normal course and caliber of the major veins. Review of the MIP images confirms the above findings. NON-VASCULAR Hepatobiliary: Normal hepatic contours and density. No visible biliary dilatation. Normal gallbladder. Pancreas: Normal contours without ductal dilatation. No peripancreatic fluid collection. Spleen: Normal arterial phase splenic enhancement pattern. Adrenals/Urinary Tract: --Adrenal glands: Normal. --Right kidney/ureter: No hydronephrosis or perinephric stranding. No nephrolithiasis. No obstructing  ureteral stones. --Left kidney/ureter: As on the prior examinations dating back to the MRI abdomen heterogeneously hyperdense mass seen partially exophytic off the lower pole of the left kidney measuring 4.2 x 4.1 cm which was thought to be a hemorrhagic cyst. --Urinary bladder: Mildly distended and fluid-filled. Stomach/Bowel: --Stomach/Duodenum: No hiatal hernia or other gastric  abnormality. Normal duodenal course and caliber. --Small bowel: No dilatation or inflammation. --Colon: There is a moderate to large amount of colonic stool seen within the right colon and cecum. Within the sigmoid rectal junction there appears to be question of mild wall thickening. Lymphatic:  No abdominal or pelvic lymphadenopathy. Reproductive: No free fluid in the pelvis. Somewhat limited visualization of the uterus, which appears mildly prominent. Musculoskeletal. No bony spinal canal stenosis or focal osseous abnormality. There is diffuse osteopenia. Degenerative changes seen in the thoracolumbar spine. Other: None. Review of the MIP images confirms the above findings. IMPRESSION: VASCULAR No acute vascular abnormality. Aortic Atherosclerosis (ICD10-I70.0). NON-VASCULAR Stable exophytic left renal probable hemorrhagic cyst measuring 4.2 x 4.1 cm. Question of mild wall thickening seen at the sigmoid rectal junction. If further evaluation is required would recommend direct visualization with colonoscopy. Diffuse mild anasarca. Mildly prominent partially visualized uterus. Electronically Signed   By: Prudencio Pair M.D.   On: 02/21/2020 22:53     Discharge Exam: Vitals:   02/26/20 1000 02/26/20 1010  BP: (!) 145/71 (!) 150/69  Pulse: 67 68  Resp: 15 18  Temp:    SpO2: 100% 100%   Vitals:   02/26/20 0824 02/26/20 0950 02/26/20 1000 02/26/20 1010  BP: (!) 177/99 126/64 (!) 145/71 (!) 150/69  Pulse: 77 65 67 68  Resp: 16 11 15 18   Temp: 98.1 F (36.7 C) (!) 96.8 F (36 C)    TempSrc: Oral Axillary    SpO2: 99% 100% 100% 100%  Weight:      Height:        General: Pt is alert, awake, not in acute distress Cardiovascular: RRR, S1/S2 +, no rubs, no gallops Respiratory: CTA bilaterally, no wheezing, no rhonchi Abdominal: Soft, NT, ND, bowel sounds + Extremities: no edema, no cyanosis    The results of significant diagnostics from this hospitalization (including imaging, microbiology,  ancillary and laboratory) are listed below for reference.     Microbiology: Recent Results (from the past 240 hour(s))  Respiratory Panel by RT PCR (Flu A&B, Covid) - Nasopharyngeal Swab     Status: None   Collection Time: 02/19/20  1:42 PM   Specimen: Nasopharyngeal Swab  Result Value Ref Range Status   SARS Coronavirus 2 by RT PCR NEGATIVE NEGATIVE Final    Comment: (NOTE) SARS-CoV-2 target nucleic acids are NOT DETECTED. The SARS-CoV-2 RNA is generally detectable in upper respiratoy specimens during the acute phase of infection. The lowest concentration of SARS-CoV-2 viral copies this assay can detect is 131 copies/mL. A negative result does not preclude SARS-Cov-2 infection and should not be used as the sole basis for treatment or other patient management decisions. A negative result may occur with  improper specimen collection/handling, submission of specimen other than nasopharyngeal swab, presence of viral mutation(s) within the areas targeted by this assay, and inadequate number of viral copies (<131 copies/mL). A negative result must be combined with clinical observations, patient history, and epidemiological information. The expected result is Negative. Fact Sheet for Patients:  PinkCheek.be Fact Sheet for Healthcare Providers:  GravelBags.it This test is not yet ap proved or cleared by the [place] and  has been authorized for detection and/or diagnosis of SARS-CoV-2 by FDA under an Emergency Use Authorization (EUA). This EUA will remain  in effect (meaning this test can be used) for the duration of the COVID-19 declaration under Section 564(b)(1) of the Act, 21 U.S.C. section 360bbb-3(b)(1), unless the authorization is terminated or revoked sooner.    Influenza A by PCR NEGATIVE NEGATIVE Final   Influenza B by PCR NEGATIVE NEGATIVE Final    Comment: (NOTE) The Xpert Xpress SARS-CoV-2/FLU/RSV assay is  intended as an aid in  the diagnosis of influenza from Nasopharyngeal swab specimens and  should not be used as a sole basis for treatment. Nasal washings and  aspirates are unacceptable for Xpert Xpress SARS-CoV-2/FLU/RSV  testing. Fact Sheet for Patients: PinkCheek.be Fact Sheet for Healthcare Providers: GravelBags.it This test is not yet approved or cleared by the [place] FDA and  has been authorized for detection and/or diagnosis of SARS-CoV-2 by  FDA under an Emergency Use Authorization (EUA). This EUA will remain  in effect (meaning this test can be used) for the duration of the  Covid-19 declaration under Section 564(b)(1) of the Act, 21  U.S.C. section 360bbb-3(b)(1), unless the authorization is  terminated or revoked. Performed at [facility], [place] [address] [place],  [address]   Respiratory Panel by RT PCR (Flu A&B, Covid) - Nasopharyngeal Swab     Status: None   Collection Time: 02/22/20  1:03 AM   Specimen: Nasopharyngeal Swab  Result Value Ref Range Status   SARS Coronavirus 2 by RT PCR NEGATIVE NEGATIVE Final    Comment: (NOTE) SARS-CoV-2 target nucleic acids are NOT DETECTED. The SARS-CoV-2 RNA is generally detectable in upper respiratoy specimens during the acute phase of infection. The lowest concentration of SARS-CoV-2 viral copies this assay can detect is 131 copies/mL. A negative result does not preclude SARS-Cov-2 infection and should not be used as the sole basis for treatment or other patient management decisions. A negative result may occur with  improper specimen collection/handling, submission of specimen other than nasopharyngeal swab, presence of viral mutation(s) within the areas targeted by this assay, and inadequate number of viral copies (<131 copies/mL). A negative result must be combined with clinical observations, patient history, and epidemiological information.  The expected result is Negative. Fact Sheet for Patients:  PinkCheek.be Fact Sheet for Healthcare Providers:  GravelBags.it This test is not yet ap proved or cleared by the [place] FDA and  has been authorized for detection and/or diagnosis of SARS-CoV-2 by FDA under an Emergency Use Authorization (EUA). This EUA will remain  in effect (meaning this test can be used) for the duration of the COVID-19 declaration under Section 564(b)(1) of the Act, 21 U.S.C. section 360bbb-3(b)(1), unless the authorization is terminated or revoked sooner.    Influenza A by PCR NEGATIVE NEGATIVE Final   Influenza B by PCR NEGATIVE NEGATIVE Final    Comment: (NOTE) The Xpert Xpress SARS-CoV-2/FLU/RSV assay is intended as an aid in  the diagnosis of influenza from Nasopharyngeal swab specimens and  should not be used as a sole basis for treatment. Nasal washings and  aspirates are unacceptable for Xpert Xpress SARS-CoV-2/FLU/RSV  testing. Fact Sheet for Patients: PinkCheek.be Fact Sheet for Healthcare Providers: GravelBags.it This test is not yet approved or cleared by the [place] FDA and  has been authorized for detection and/or diagnosis of SARS-CoV-2 by  FDA under an Emergency Use Authorization (EUA). This EUA will remain  in effect (meaning this test  can be used) for the duration of the  Covid-19 declaration under Section 564(b)(1) of the Act, 21  U.S.C. section 360bbb-3(b)(1), unless the authorization is  terminated or revoked. Performed at [facility], [place] [address], [place], [place] [address]      Labs: BNP (last 3 results) No results for input(s): BNP in the last 8760 hours. Basic Metabolic Panel: Recent Labs  Lab 02/21/20 1927 02/22/20 0419 02/23/20 0329 02/24/20 0427 02/26/20 0240  NA 133* 135 131* 131* 135  K 3.8 3.2* 4.0 4.4 4.0  CL 92* 97* 96*  95* 100  CO2 29 29 28 28 27   GLUCOSE 240* 209* 192* 309* 245*  BUN 33* 29* 24* 23 10  CREATININE 0.98 0.81 0.80 0.90 0.50  CALCIUM 9.7 8.8* 8.4* 8.5* 8.0*   Liver Function Tests: Recent Labs  Lab 02/19/20 1644 02/21/20 1927 02/23/20 0329  AST 15 30 23   ALT 13 24 22   ALKPHOS 53 93 65  BILITOT 0.9 1.6* 0.9  PROT 3.8* 5.9* 4.4*  ALBUMIN 2.1* 3.3* 2.4*   Recent Labs  Lab 02/19/20 1644 02/21/20 1927  LIPASE 61* 67*   No results for input(s): AMMONIA in the last 168 hours. CBC: Recent Labs  Lab 02/20/20 0555 02/20/20 0555 02/21/20 1927 02/22/20 0419 02/23/20 0329 02/24/20 0427 02/26/20 0240  WBC 9.1   < > 7.8 5.4 5.4 5.6 5.4  NEUTROABS 6.4  --   --   --   --   --  3.7  HGB 16.3*   < > 16.6* 13.5 12.8 12.7 13.1  HCT 47.6*   < > 49.6* 40.4 38.2 37.6 38.0  MCV 86.1   < > 86.9 86.5 86.0 86.0 85.6  PLT 259   < > 243 195 182 179 166   < > = values in this interval not displayed.   Cardiac Enzymes: No results for input(s): CKTOTAL, CKMB, CKMBINDEX, TROPONINI in the last 168 hours. BNP: Invalid input(s): POCBNP CBG: Recent Labs  Lab 02/25/20 0641 02/25/20 1116 02/25/20 1633 02/25/20 2141 02/26/20 0519  GLUCAP 162* 344* 188* 133* 281*   D-Dimer No results for input(s): DDIMER in the last 72 hours. Hgb A1c No results for input(s): HGBA1C in the last 72 hours. Lipid Profile No results for input(s): CHOL, HDL, LDLCALC, TRIG, CHOLHDL, LDLDIRECT in the last 72 hours. Thyroid function studies No results for input(s): TSH, T4TOTAL, T3FREE, THYROIDAB in the last 72 hours.  Invalid input(s): FREET3 Anemia work up No results for input(s): VITAMINB12, FOLATE, FERRITIN, TIBC, IRON, RETICCTPCT in the last 72 hours. Urinalysis    Component Value Date/Time   COLORURINE YELLOW 02/23/2020 2130   APPEARANCEUR HAZY (A) 02/23/2020 2130   LABSPEC 1.007 02/23/2020 2130   PHURINE 6.0 02/23/2020 2130   GLUCOSEU >=500 (A) 02/23/2020 2130   GLUCOSEU >=1000 (A) 09/30/2019 1453    HGBUR SMALL (A) 02/23/2020 2130   BILIRUBINUR NEGATIVE 02/23/2020 2130   KETONESUR NEGATIVE 02/23/2020 2130   PROTEINUR 100 (A) 02/23/2020 2130   UROBILINOGEN 0.2 09/30/2019 1453   NITRITE POSITIVE (A) 02/23/2020 2130   LEUKOCYTESUR MODERATE (A) 02/23/2020 2130   Sepsis Labs Invalid input(s): PROCALCITONIN,  WBC,  LACTICIDVEN Microbiology Recent Results (from the past 240 hour(s))  Respiratory Panel by RT PCR (Flu A&B, Covid) - Nasopharyngeal Swab     Status: None   Collection Time: 02/19/20  1:42 PM   Specimen: Nasopharyngeal Swab  Result Value Ref Range Status   SARS Coronavirus 2 by RT PCR NEGATIVE NEGATIVE Final  Comment: (NOTE) SARS-CoV-2 target nucleic acids are NOT DETECTED. The SARS-CoV-2 RNA is generally detectable in upper respiratoy specimens during the acute phase of infection. The lowest concentration of SARS-CoV-2 viral copies this assay can detect is 131 copies/mL. A negative result does not preclude SARS-Cov-2 infection and should not be used as the sole basis for treatment or other patient management decisions. A negative result may occur with  improper specimen collection/handling, submission of specimen other than nasopharyngeal swab, presence of viral mutation(s) within the areas targeted by this assay, and inadequate number of viral copies (<131 copies/mL). A negative result must be combined with clinical observations, patient history, and epidemiological information. The expected result is Negative. Fact Sheet for Patients:  PinkCheek.be Fact Sheet for Healthcare Providers:  GravelBags.it This test is not yet ap proved or cleared by the [place] FDA and  has been authorized for detection and/or diagnosis of SARS-CoV-2 by FDA under an Emergency Use Authorization (EUA). This EUA will remain  in effect (meaning this test can be used) for the duration of the COVID-19 declaration under Section  564(b)(1) of the Act, 21 U.S.C. section 360bbb-3(b)(1), unless the authorization is terminated or revoked sooner.    Influenza A by PCR NEGATIVE NEGATIVE Final   Influenza B by PCR NEGATIVE NEGATIVE Final    Comment: (NOTE) The Xpert Xpress SARS-CoV-2/FLU/RSV assay is intended as an aid in  the diagnosis of influenza from Nasopharyngeal swab specimens and  should not be used as a sole basis for treatment. Nasal washings and  aspirates are unacceptable for Xpert Xpress SARS-CoV-2/FLU/RSV  testing. Fact Sheet for Patients: PinkCheek.be Fact Sheet for Healthcare Providers: GravelBags.it This test is not yet approved or cleared by the [place] FDA and  has been authorized for detection and/or diagnosis of SARS-CoV-2 by  FDA under an Emergency Use Authorization (EUA). This EUA will remain  in effect (meaning this test can be used) for the duration of the  Covid-19 declaration under Section 564(b)(1) of the Act, 21  U.S.C. section 360bbb-3(b)(1), unless the authorization is  terminated or revoked. Performed at [facility], [place] [address], [place], [place] [address]   Respiratory Panel by RT PCR (Flu A&B, Covid) - Nasopharyngeal Swab     Status: None   Collection Time: 02/22/20  1:03 AM   Specimen: Nasopharyngeal Swab  Result Value Ref Range Status   SARS Coronavirus 2 by RT PCR NEGATIVE NEGATIVE Final    Comment: (NOTE) SARS-CoV-2 target nucleic acids are NOT DETECTED. The SARS-CoV-2 RNA is generally detectable in upper respiratoy specimens during the acute phase of infection. The lowest concentration of SARS-CoV-2 viral copies this assay can detect is 131 copies/mL. A negative result does not preclude SARS-Cov-2 infection and should not be used as the sole basis for treatment or other patient management decisions. A negative result may occur with  improper specimen collection/handling, submission of specimen  other than nasopharyngeal swab, presence of viral mutation(s) within the areas targeted by this assay, and inadequate number of viral copies (<131 copies/mL). A negative result must be combined with clinical observations, patient history, and epidemiological information. The expected result is Negative. Fact Sheet for Patients:  PinkCheek.be Fact Sheet for Healthcare Providers:  GravelBags.it This test is not yet ap proved or cleared by the [place] FDA and  has been authorized for detection and/or diagnosis of SARS-CoV-2 by FDA under an Emergency Use Authorization (EUA). This EUA will remain  in effect (meaning this test can be used) for  the duration of the COVID-19 declaration under Section 564(b)(1) of the Act, 21 U.S.C. section 360bbb-3(b)(1), unless the authorization is terminated or revoked sooner.    Influenza A by PCR NEGATIVE NEGATIVE Final   Influenza B by PCR NEGATIVE NEGATIVE Final    Comment: (NOTE) The Xpert Xpress SARS-CoV-2/FLU/RSV assay is intended as an aid in  the diagnosis of influenza from Nasopharyngeal swab specimens and  should not be used as a sole basis for treatment. Nasal washings and  aspirates are unacceptable for Xpert Xpress SARS-CoV-2/FLU/RSV  testing. Fact Sheet for Patients: PinkCheek.be Fact Sheet for Healthcare Providers: GravelBags.it This test is not yet approved or cleared by the [place] FDA and  has been authorized for detection and/or diagnosis of SARS-CoV-2 by  FDA under an Emergency Use Authorization (EUA). This EUA will remain  in effect (meaning this test can be used) for the duration of the  Covid-19 declaration under Section 564(b)(1) of the Act, 21  U.S.C. section 360bbb-3(b)(1), unless the authorization is  terminated or revoked. Performed at [facility], [place] [address], [place], [place] [address]       Time coordinating discharge: Over 30 minutes  SIGNED:   Darliss Cheney, MD  [facility] 02/26/2020, 11:14 AM  If 7PM-7AM, please contact night-coverage [email or web address]

## 2020-02-27 ENCOUNTER — Encounter: Payer: Self-pay | Admitting: Gastroenterology

## 2020-02-27 ENCOUNTER — Telehealth: Payer: Self-pay | Admitting: Internal Medicine

## 2020-02-27 DIAGNOSIS — S93401A Sprain of unspecified ligament of right ankle, initial encounter: Secondary | ICD-10-CM | POA: Diagnosis not present

## 2020-02-27 DIAGNOSIS — S7001XA Contusion of right hip, initial encounter: Secondary | ICD-10-CM | POA: Diagnosis not present

## 2020-02-27 DIAGNOSIS — S8391XA Sprain of unspecified site of right knee, initial encounter: Secondary | ICD-10-CM | POA: Diagnosis not present

## 2020-02-27 LAB — SURGICAL PATHOLOGY

## 2020-02-27 NOTE — Telephone Encounter (Signed)
Darlene with [name] called and said that patient husband reported that she slid out of her chair this morning and her left foot twisted. She is moving her foot but its painful and there is swelling  Darlene told them that they needed to go somewhere and be seen. Carlyon Shadow is trying to get someone to go check on patient

## 2020-02-27 NOTE — Telephone Encounter (Signed)
For your information  

## 2020-02-27 NOTE — Telephone Encounter (Signed)
[facility] in [place] or [place] takes walk ins  They can rest, ice, compression, elevate  TMS

## 2020-02-27 NOTE — Telephone Encounter (Signed)
Pt and nurse informed. Pt took down the information for [facility] and will go there. States it is her right ankle and not the left.  Nurse calling back and informed that the right ankle is very swollen and in pain. Informed them that patient was given the address for [facility] and pt states she will go be seen.   Patient's BP: 173/88 without symptoms, Dr Olivia Mackie has been informed.   Needing verbal to continue Nursing and physical therapy.  [phone] Moshe Salisbury, RN at [name]  This is okay per Dr Weyman Pedro. Verbal Given  CC: D. Gherghe   410 blood sugar fasting this morning and was 382 just now an hour after eating.

## 2020-02-27 NOTE — Telephone Encounter (Signed)
Med compliance an issue  BP and DM 2 not controlled  F/u specialist as well s/p hospital d/c   Ok verbal nursing and PT

## 2020-02-28 NOTE — Addendum Note (Signed)
Addendum  created 02/28/20 1013 by Belinda Block, MD   Attestation recorded in Intra[facility], Clinical Note Signed, [place] filed

## 2020-03-01 ENCOUNTER — Telehealth: Payer: Self-pay

## 2020-03-01 NOTE — Telephone Encounter (Signed)
Failed attempt to reach patient for transitional care management. No answer. Will follow.

## 2020-03-02 ENCOUNTER — Ambulatory Visit: Payer: Medicare Other | Admitting: Internal Medicine

## 2020-03-02 ENCOUNTER — Other Ambulatory Visit: Payer: Medicare Other

## 2020-03-02 NOTE — Telephone Encounter (Signed)
Second failed attempt to reach patient for transition of care and schedule hospital follow up with PMD. No answer. Left message to return call to nurse for follow up.

## 2020-03-03 ENCOUNTER — Telehealth: Payer: Self-pay | Admitting: Internal Medicine

## 2020-03-03 NOTE — Telephone Encounter (Signed)
Faxed to [name]  Skilled nurse to evaluate and develop plan of care  Verbal on [id number] @ [id number] Cert date [id number] to 04/09/20

## 2020-03-03 NOTE — Telephone Encounter (Signed)
Okay for orders? 

## 2020-03-03 NOTE — Telephone Encounter (Signed)
Verbal ok?

## 2020-03-03 NOTE — Telephone Encounter (Signed)
Nurse from [facility] called about patient said that her blood sugar was elevated. Would like a call back at [phone]

## 2020-03-03 NOTE — Telephone Encounter (Signed)
Marisa Forbes with [facility] (Caregiver) requests to be called at ph# [phone] re: Patient's blood sugars have been running high (consistently running over 300 and at times in the 400-500's). Marisa Forbes would also like to discuss patient's medications (verify what medications/dosage patient is supposed to be taking)

## 2020-03-03 NOTE — Telephone Encounter (Signed)
Mark with [name] called to get verbal orders for home health for physical therapy starting yesterday for two times a week for six weeks. One time a week for two weeks. Please advise. [phone]. Leave vm on secured box

## 2020-03-03 NOTE — Telephone Encounter (Signed)
Spoke with Moshe Salisbury and informed them that Philemon Kingdom, MD manages the patient's diabetes. She will give them a call.

## 2020-03-04 NOTE — Telephone Encounter (Signed)
Noted! Thank you

## 2020-03-04 NOTE — Telephone Encounter (Signed)
Verbal given. Mark then asked if they could get an order for a Rolator for the patient. Spoke with Dr Olivia Mackie and she wrote an order.   Will fax to the following requested: Attn- Link Snuffer [phone]

## 2020-03-04 NOTE — Telephone Encounter (Signed)
Faxed order for rollator to [facility]

## 2020-03-04 NOTE — Telephone Encounter (Signed)
Returned call to [facility] and asked if she can fax [place] recent blood sugar readings and a MAR.  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  Marisa Forbes states patient blood sugars have not gone under 300 since she was last here and only taking insulin BID.  I read the insulin instructions from last AVS with [place] so now Marisa Forbes knows exactly what patient should be taking and when.  Marisa Forbes has just started working with patient so she will work on getting the patient to take her insulin as outlined in the AVS I read her and update [place] on how her sugars are doing at that point.

## 2020-03-11 DIAGNOSIS — I110 Hypertensive heart disease with heart failure: Secondary | ICD-10-CM | POA: Diagnosis not present

## 2020-03-11 DIAGNOSIS — G4700 Insomnia, unspecified: Secondary | ICD-10-CM | POA: Diagnosis not present

## 2020-03-11 DIAGNOSIS — E1142 Type 2 diabetes mellitus with diabetic polyneuropathy: Secondary | ICD-10-CM | POA: Diagnosis not present

## 2020-03-11 DIAGNOSIS — I69398 Other sequelae of cerebral infarction: Secondary | ICD-10-CM | POA: Diagnosis not present

## 2020-03-11 DIAGNOSIS — I5042 Chronic combined systolic (congestive) and diastolic (congestive) heart failure: Secondary | ICD-10-CM | POA: Diagnosis not present

## 2020-03-11 DIAGNOSIS — H9191 Unspecified hearing loss, right ear: Secondary | ICD-10-CM | POA: Diagnosis not present

## 2020-03-11 DIAGNOSIS — Z853 Personal history of malignant neoplasm of breast: Secondary | ICD-10-CM | POA: Diagnosis not present

## 2020-03-11 DIAGNOSIS — M479 Spondylosis, unspecified: Secondary | ICD-10-CM | POA: Diagnosis not present

## 2020-03-11 DIAGNOSIS — Z794 Long term (current) use of insulin: Secondary | ICD-10-CM | POA: Diagnosis not present

## 2020-03-11 DIAGNOSIS — K2900 Acute gastritis without bleeding: Secondary | ICD-10-CM | POA: Diagnosis not present

## 2020-03-11 DIAGNOSIS — K219 Gastro-esophageal reflux disease without esophagitis: Secondary | ICD-10-CM | POA: Diagnosis not present

## 2020-03-11 DIAGNOSIS — H8110 Benign paroxysmal vertigo, unspecified ear: Secondary | ICD-10-CM | POA: Diagnosis not present

## 2020-03-11 DIAGNOSIS — E785 Hyperlipidemia, unspecified: Secondary | ICD-10-CM | POA: Diagnosis not present

## 2020-03-11 DIAGNOSIS — I252 Old myocardial infarction: Secondary | ICD-10-CM | POA: Diagnosis not present

## 2020-03-11 DIAGNOSIS — I255 Ischemic cardiomyopathy: Secondary | ICD-10-CM | POA: Diagnosis not present

## 2020-03-11 DIAGNOSIS — H5462 Unqualified visual loss, left eye, normal vision right eye: Secondary | ICD-10-CM | POA: Diagnosis not present

## 2020-03-11 DIAGNOSIS — I959 Hypotension, unspecified: Secondary | ICD-10-CM | POA: Diagnosis not present

## 2020-03-11 DIAGNOSIS — E43 Unspecified severe protein-calorie malnutrition: Secondary | ICD-10-CM | POA: Diagnosis not present

## 2020-03-11 DIAGNOSIS — E1165 Type 2 diabetes mellitus with hyperglycemia: Secondary | ICD-10-CM | POA: Diagnosis not present

## 2020-03-11 DIAGNOSIS — E1159 Type 2 diabetes mellitus with other circulatory complications: Secondary | ICD-10-CM | POA: Diagnosis not present

## 2020-03-11 DIAGNOSIS — I701 Atherosclerosis of renal artery: Secondary | ICD-10-CM | POA: Diagnosis not present

## 2020-03-11 DIAGNOSIS — I420 Dilated cardiomyopathy: Secondary | ICD-10-CM | POA: Diagnosis not present

## 2020-03-11 DIAGNOSIS — G243 Spasmodic torticollis: Secondary | ICD-10-CM | POA: Diagnosis not present

## 2020-03-11 DIAGNOSIS — H4900 Third [oculomotor] nerve palsy, unspecified eye: Secondary | ICD-10-CM | POA: Diagnosis not present

## 2020-03-11 DIAGNOSIS — I2510 Atherosclerotic heart disease of native coronary artery without angina pectoris: Secondary | ICD-10-CM | POA: Diagnosis not present

## 2020-03-12 DIAGNOSIS — I110 Hypertensive heart disease with heart failure: Secondary | ICD-10-CM | POA: Diagnosis not present

## 2020-03-12 DIAGNOSIS — E1142 Type 2 diabetes mellitus with diabetic polyneuropathy: Secondary | ICD-10-CM | POA: Diagnosis not present

## 2020-03-12 DIAGNOSIS — E1165 Type 2 diabetes mellitus with hyperglycemia: Secondary | ICD-10-CM | POA: Diagnosis not present

## 2020-03-12 DIAGNOSIS — I5042 Chronic combined systolic (congestive) and diastolic (congestive) heart failure: Secondary | ICD-10-CM | POA: Diagnosis not present

## 2020-03-12 DIAGNOSIS — K2900 Acute gastritis without bleeding: Secondary | ICD-10-CM | POA: Diagnosis not present

## 2020-03-12 DIAGNOSIS — E1159 Type 2 diabetes mellitus with other circulatory complications: Secondary | ICD-10-CM | POA: Diagnosis not present

## 2020-03-16 ENCOUNTER — Ambulatory Visit: Payer: Medicare Other | Admitting: Gastroenterology

## 2020-03-17 DIAGNOSIS — E1142 Type 2 diabetes mellitus with diabetic polyneuropathy: Secondary | ICD-10-CM | POA: Diagnosis not present

## 2020-03-17 DIAGNOSIS — I110 Hypertensive heart disease with heart failure: Secondary | ICD-10-CM | POA: Diagnosis not present

## 2020-03-17 DIAGNOSIS — E1165 Type 2 diabetes mellitus with hyperglycemia: Secondary | ICD-10-CM | POA: Diagnosis not present

## 2020-03-17 DIAGNOSIS — K2900 Acute gastritis without bleeding: Secondary | ICD-10-CM | POA: Diagnosis not present

## 2020-03-17 DIAGNOSIS — I5042 Chronic combined systolic (congestive) and diastolic (congestive) heart failure: Secondary | ICD-10-CM | POA: Diagnosis not present

## 2020-03-17 DIAGNOSIS — E1159 Type 2 diabetes mellitus with other circulatory complications: Secondary | ICD-10-CM | POA: Diagnosis not present

## 2020-03-18 ENCOUNTER — Telehealth: Payer: Self-pay | Admitting: Internal Medicine

## 2020-03-18 DIAGNOSIS — E1159 Type 2 diabetes mellitus with other circulatory complications: Secondary | ICD-10-CM | POA: Diagnosis not present

## 2020-03-18 DIAGNOSIS — E1165 Type 2 diabetes mellitus with hyperglycemia: Secondary | ICD-10-CM | POA: Diagnosis not present

## 2020-03-18 DIAGNOSIS — I110 Hypertensive heart disease with heart failure: Secondary | ICD-10-CM | POA: Diagnosis not present

## 2020-03-18 DIAGNOSIS — E1142 Type 2 diabetes mellitus with diabetic polyneuropathy: Secondary | ICD-10-CM | POA: Diagnosis not present

## 2020-03-18 DIAGNOSIS — K2900 Acute gastritis without bleeding: Secondary | ICD-10-CM | POA: Diagnosis not present

## 2020-03-18 DIAGNOSIS — I5042 Chronic combined systolic (congestive) and diastolic (congestive) heart failure: Secondary | ICD-10-CM | POA: Diagnosis not present

## 2020-03-18 NOTE — Telephone Encounter (Signed)
Cara with [name] requests to be called at ph# [phone] re: Patient has been having high blood sugars, running from the 300's, 400's into the 500's. Moshe Salisbury states patient has been taking her medication as directed and has been following her diet.

## 2020-03-18 NOTE — Telephone Encounter (Signed)
Let's increase: Please change: Insulin Before breakfast Before lunch Before dinner  Regular (R) - clear 15 >> 25 15 >> 25 15 >> 25  NPH (N) - cloudy 15 >> 25 15 >> 25 15 >> 25  Please inject the insulin 30 min before meals.   Please et me know how this goes. Also, please have her check with her insurance whether U500 insulin would be covered.

## 2020-03-18 NOTE — Telephone Encounter (Signed)
Left message for Marisa Forbes to return call, we will need blood sugar log for the last two weeks.

## 2020-03-18 NOTE — Telephone Encounter (Signed)
Spoke with Marisa Forbes and patient together on speaker, patient only has 3 days of recent cbg.  03-07-20 1:30 PM CBG- 500              8:30 PM CBG-500  03-08-20 9:30 AM too high for meter to read (her meter only goes to 500)  03-20-19 7:30 AM CBG 500               1:30 PM CBG 382  Patient is taking 15 units TID of both insulins.  Marisa Forbes states patient is still struggling with changing diet and has been eating a lot of sugar free pudding and drinking diet soda.  Please advise.

## 2020-03-19 ENCOUNTER — Telehealth: Payer: Self-pay | Admitting: Internal Medicine

## 2020-03-19 DIAGNOSIS — E1165 Type 2 diabetes mellitus with hyperglycemia: Secondary | ICD-10-CM | POA: Diagnosis not present

## 2020-03-19 DIAGNOSIS — E1142 Type 2 diabetes mellitus with diabetic polyneuropathy: Secondary | ICD-10-CM | POA: Diagnosis not present

## 2020-03-19 DIAGNOSIS — I110 Hypertensive heart disease with heart failure: Secondary | ICD-10-CM | POA: Diagnosis not present

## 2020-03-19 DIAGNOSIS — I5042 Chronic combined systolic (congestive) and diastolic (congestive) heart failure: Secondary | ICD-10-CM | POA: Diagnosis not present

## 2020-03-19 DIAGNOSIS — K2900 Acute gastritis without bleeding: Secondary | ICD-10-CM | POA: Diagnosis not present

## 2020-03-19 DIAGNOSIS — E1159 Type 2 diabetes mellitus with other circulatory complications: Secondary | ICD-10-CM | POA: Diagnosis not present

## 2020-03-19 NOTE — Telephone Encounter (Signed)
Notified Cara of the medication change, she will instruct patient.

## 2020-03-19 NOTE — Telephone Encounter (Signed)
Patient informed and verbalized understanding.  States that it is not that bad and the pain is in her tailbone area. She will schedule a visit if this worsens.

## 2020-03-19 NOTE — Telephone Encounter (Signed)
Nurse from [facility] called said pt fail having some pain in the area she fail on .

## 2020-03-19 NOTE — Telephone Encounter (Signed)
She can try Tylenol or lidocaine pain patch otc or call to schedule in person appt

## 2020-03-22 ENCOUNTER — Telehealth: Payer: Self-pay | Admitting: Internal Medicine

## 2020-03-22 DIAGNOSIS — E1165 Type 2 diabetes mellitus with hyperglycemia: Secondary | ICD-10-CM | POA: Diagnosis not present

## 2020-03-22 DIAGNOSIS — E1159 Type 2 diabetes mellitus with other circulatory complications: Secondary | ICD-10-CM | POA: Diagnosis not present

## 2020-03-22 DIAGNOSIS — E1142 Type 2 diabetes mellitus with diabetic polyneuropathy: Secondary | ICD-10-CM | POA: Diagnosis not present

## 2020-03-22 DIAGNOSIS — I110 Hypertensive heart disease with heart failure: Secondary | ICD-10-CM | POA: Diagnosis not present

## 2020-03-22 DIAGNOSIS — I5042 Chronic combined systolic (congestive) and diastolic (congestive) heart failure: Secondary | ICD-10-CM | POA: Diagnosis not present

## 2020-03-22 DIAGNOSIS — K2900 Acute gastritis without bleeding: Secondary | ICD-10-CM | POA: Diagnosis not present

## 2020-03-22 NOTE — Telephone Encounter (Signed)
The nurse from [facility] stated patient felt like she would faint. Due to BP being so low and sx. I suggested going to ED, due to no appointments available here and that she is orthostatic.

## 2020-03-22 NOTE — Telephone Encounter (Signed)
Marisa Forbes with [name] called and states that pt's BP readings. right arm 66/42 seated-left arm seated 68/44. When pt is lying down 86/58. [name] advised to go to ED.

## 2020-03-22 NOTE — Telephone Encounter (Signed)
Spoke with the Patient and she states she did not go because she felt better and her bp went back up. States she had not ate before taking the BP meds and she sat for a while and drank a lot of fluids.

## 2020-03-22 NOTE — Telephone Encounter (Signed)
Agree she needs to go to ED   [place]

## 2020-03-25 ENCOUNTER — Telehealth: Payer: Self-pay | Admitting: Internal Medicine

## 2020-03-25 DIAGNOSIS — E1159 Type 2 diabetes mellitus with other circulatory complications: Secondary | ICD-10-CM | POA: Diagnosis not present

## 2020-03-25 DIAGNOSIS — K2900 Acute gastritis without bleeding: Secondary | ICD-10-CM | POA: Diagnosis not present

## 2020-03-25 DIAGNOSIS — E1165 Type 2 diabetes mellitus with hyperglycemia: Secondary | ICD-10-CM | POA: Diagnosis not present

## 2020-03-25 DIAGNOSIS — E1142 Type 2 diabetes mellitus with diabetic polyneuropathy: Secondary | ICD-10-CM | POA: Diagnosis not present

## 2020-03-25 DIAGNOSIS — I5042 Chronic combined systolic (congestive) and diastolic (congestive) heart failure: Secondary | ICD-10-CM | POA: Diagnosis not present

## 2020-03-25 DIAGNOSIS — I110 Hypertensive heart disease with heart failure: Secondary | ICD-10-CM | POA: Diagnosis not present

## 2020-03-25 NOTE — Telephone Encounter (Signed)
Okay for order?

## 2020-03-25 NOTE — Telephone Encounter (Signed)
Verbal given 

## 2020-03-25 NOTE — Telephone Encounter (Signed)
[place] for orders  [facility]

## 2020-03-25 NOTE — Telephone Encounter (Signed)
Marisa Forbes with [name] called and requested verbal order for continued nursing. Once a week for at least 4-6 weeks. Please advise [phone]. She states that you can leave a detailed message on her confidential voicemail

## 2020-03-26 ENCOUNTER — Telehealth: Payer: Self-pay | Admitting: Internal Medicine

## 2020-03-26 DIAGNOSIS — I10 Essential (primary) hypertension: Secondary | ICD-10-CM

## 2020-03-26 MED ORDER — ONETOUCH VERIO VI STRP: ORAL_STRIP | 12 refills | Status: AC

## 2020-03-26 MED ORDER — ONETOUCH VERIO W/DEVICE KIT: PACK | 0 refills | Status: AC

## 2020-03-26 NOTE — Telephone Encounter (Signed)
Melissa, [place] to send meter Rx.

## 2020-03-26 NOTE — Telephone Encounter (Signed)
Will ask endocrine managing diabetes to send in new meter Dr. Renne Crigler  Will discuss low BP with Dr. Dorris Carnes ok to hold prn Hydralazine for now and monitor BP 2x per day if running<90/<60 hold other meds as well   Pt needs f/u cards for repeat echo last 12/2018 EF 25-30% and low BP  Dr. Harrington Challenger can you coordinate this please?    Is pt agreeable to palliative care consult for home?   Also tell Marcene Brawn please contact specialists for issues as well  Heart issues, blood pressure cardiology  Diabetes endocrine as above   [place]

## 2020-03-26 NOTE — Telephone Encounter (Signed)
Called and informed the nurse of the below. She will contact the Cardiologist.   Patient will hold all blood pressure medications for today and will check her BP twice daily. If BP less than 90/60 she will hold all medications again. Will only take Hydralazine if BP is OVER 130/80.

## 2020-03-26 NOTE — Telephone Encounter (Signed)
[name] Nurse reporting. Marcene Brawn.  Patient's BP was 80/44. Patient has been mistakenly taking hydralazine  in the morning. She is also on 40 mg fluid pill. Currently holding her medication.   Needing a new glucometer as her old one has been malfunctioning. Checks sugars 4 times a day.   Patient has agreed to hospice informational visit.

## 2020-03-26 NOTE — Addendum Note (Signed)
Addended by: Orland Mustard on: 03/26/2020 06:50 PM   Modules accepted: Orders

## 2020-03-26 NOTE — Telephone Encounter (Signed)
New meter sent 

## 2020-03-26 NOTE — Addendum Note (Signed)
Addended by: Cardell Peach I on: 03/26/2020 04:19 PM   Modules accepted: Orders

## 2020-03-29 NOTE — Telephone Encounter (Signed)
I would recomm cutting back on coreg to 6.25 bid Hold hydralazine.   Follow BP   If over 130/ then take only 5 mg 2x per day

## 2020-03-30 MED ORDER — CARVEDILOL 6.25 MG PO TABS: ORAL_TABLET | ORAL | 3 refills | Status: AC

## 2020-03-30 MED ORDER — HYDRALAZINE HCL 10 MG PO TABS
5.0000 mg | ORAL_TABLET | Freq: Two times a day (BID) | ORAL | 2 refills | Status: AC | PRN
Start: 2020-03-30 — End: ?

## 2020-03-30 NOTE — Telephone Encounter (Signed)
Called and spoke to patient. She states that her BP was good yesterday (SBP 120s). She states that she did take all of her medicine except for her hydralazine. Made patient aware of Dr. Harrington Challenger' instructions below to decrease carvedilol to 6.25 mg BID and hold hydralazine. Made patient aware that she may take 5 mg of hydralazine BID prn for SBP >130. Instructed patient to continue to monitor BP and keep a record of it. Patient is overdue for follow up with Dr. Harrington Challenger. Appointment made on 6/16. Instructed patient to let us know if her Sx change or worsen prior to that time. She verbalized understanding and thanked me for the call.

## 2020-03-30 NOTE — Addendum Note (Signed)
Addended by: Drue Novel I on: 03/30/2020 08:49 AM   Modules accepted: Orders

## 2020-04-01 ENCOUNTER — Other Ambulatory Visit: Payer: Self-pay

## 2020-04-01 ENCOUNTER — Emergency Department (HOSPITAL_COMMUNITY)
Admission: EM | Admit: 2020-04-01 | Discharge: 2020-04-01 | Disposition: A | Discharge status: Home / Self Care | Payer: Medicare Other | Attending: Emergency Medicine | Admitting: Emergency Medicine

## 2020-04-01 ENCOUNTER — Emergency Department (HOSPITAL_COMMUNITY): Payer: Medicare Other

## 2020-04-01 ENCOUNTER — Encounter (HOSPITAL_COMMUNITY): Payer: Self-pay

## 2020-04-01 DIAGNOSIS — Z8673 Personal history of transient ischemic attack (TIA), and cerebral infarction without residual deficits: Secondary | ICD-10-CM | POA: Diagnosis not present

## 2020-04-01 DIAGNOSIS — Z79899 Other long term (current) drug therapy: Secondary | ICD-10-CM | POA: Insufficient documentation

## 2020-04-01 DIAGNOSIS — E1140 Type 2 diabetes mellitus with diabetic neuropathy, unspecified: Secondary | ICD-10-CM | POA: Insufficient documentation

## 2020-04-01 DIAGNOSIS — Z794 Long term (current) use of insulin: Secondary | ICD-10-CM | POA: Diagnosis not present

## 2020-04-01 DIAGNOSIS — I252 Old myocardial infarction: Secondary | ICD-10-CM | POA: Diagnosis not present

## 2020-04-01 DIAGNOSIS — Z923 Personal history of irradiation: Secondary | ICD-10-CM | POA: Insufficient documentation

## 2020-04-01 DIAGNOSIS — R112 Nausea with vomiting, unspecified: Secondary | ICD-10-CM | POA: Diagnosis not present

## 2020-04-01 DIAGNOSIS — Z9221 Personal history of antineoplastic chemotherapy: Secondary | ICD-10-CM | POA: Insufficient documentation

## 2020-04-01 DIAGNOSIS — I110 Hypertensive heart disease with heart failure: Secondary | ICD-10-CM | POA: Diagnosis not present

## 2020-04-01 DIAGNOSIS — R42 Dizziness and giddiness: Secondary | ICD-10-CM | POA: Diagnosis not present

## 2020-04-01 DIAGNOSIS — Z853 Personal history of malignant neoplasm of breast: Secondary | ICD-10-CM | POA: Insufficient documentation

## 2020-04-01 DIAGNOSIS — R1110 Vomiting, unspecified: Secondary | ICD-10-CM | POA: Diagnosis not present

## 2020-04-01 DIAGNOSIS — I5042 Chronic combined systolic (congestive) and diastolic (congestive) heart failure: Secondary | ICD-10-CM | POA: Diagnosis not present

## 2020-04-01 DIAGNOSIS — E860 Dehydration: Secondary | ICD-10-CM | POA: Diagnosis not present

## 2020-04-01 DIAGNOSIS — Z7982 Long term (current) use of aspirin: Secondary | ICD-10-CM | POA: Diagnosis not present

## 2020-04-01 DIAGNOSIS — R531 Weakness: Secondary | ICD-10-CM | POA: Diagnosis present

## 2020-04-01 LAB — BASIC METABOLIC PANEL
Anion gap: 14 (ref 5–15)
BUN: 29 mg/dL — ABNORMAL HIGH (ref 8–23)
CO2: 24 mmol/L (ref 22–32)
Calcium: 9.2 mg/dL (ref 8.9–10.3)
Chloride: 91 mmol/L — ABNORMAL LOW (ref 98–111)
Creatinine, Ser: 0.85 mg/dL (ref 0.44–1.00)
GFR calc Af Amer: >60 mL/min (ref >60)
GFR calc non Af Amer: >60 mL/min (ref >60)
Glucose, Bld: 258 mg/dL — ABNORMAL HIGH (ref 70–99)
Potassium: 4.3 mmol/L (ref 3.5–5.1)
Sodium: 129 mmol/L — ABNORMAL LOW (ref 135–145)

## 2020-04-01 LAB — HEPATIC FUNCTION PANEL
ALT: 14 U/L (ref 0–44)
AST: 15 U/L (ref 15–41)
Albumin: 3.3 g/dL — ABNORMAL LOW (ref 3.5–5.0)
Alkaline Phosphatase: 105 U/L (ref 38–126)
Bilirubin, Direct: 0.1 mg/dL (ref 0.0–0.2)
Indirect Bilirubin: 1 mg/dL — ABNORMAL HIGH (ref 0.3–0.9)
Total Bilirubin: 1.1 mg/dL (ref 0.3–1.2)
Total Protein: 6.2 g/dL — ABNORMAL LOW (ref 6.5–8.1)

## 2020-04-01 LAB — CBC
HCT: 36.2 % (ref 36.0–46.0)
Hemoglobin: 12 g/dL (ref 12.0–15.0)
MCH: 30 pg (ref 26.0–34.0)
MCHC: 33.1 g/dL (ref 30.0–36.0)
MCV: 90.5 fL (ref 80.0–100.0)
Platelets: 269 10*3/uL (ref 150–400)
RBC: 4 MIL/uL (ref 3.87–5.11)
RDW: 13.1 % (ref 11.5–15.5)
WBC: 7.6 10*3/uL (ref 4.0–10.5)
nRBC: 0 % (ref 0.0–0.2)

## 2020-04-01 LAB — URINALYSIS, ROUTINE W REFLEX MICROSCOPIC
Bilirubin Urine: NEGATIVE
Glucose, UA: >=500 mg/dL — AB
Hgb urine dipstick: NEGATIVE
Ketones, ur: NEGATIVE mg/dL
Leukocytes,Ua: NEGATIVE
Nitrite: NEGATIVE
Protein, ur: 100 mg/dL — AB
Specific Gravity, Urine: 1.012 (ref 1.005–1.030)
pH: 5 (ref 5.0–8.0)

## 2020-04-01 LAB — LIPASE, BLOOD: Lipase: 61 U/L — ABNORMAL HIGH (ref 11–51)

## 2020-04-01 LAB — CBG MONITORING, ED
Glucose-Capillary: 266 mg/dL — ABNORMAL HIGH (ref 70–99)
Glucose-Capillary: 385 mg/dL — ABNORMAL HIGH (ref 70–99)

## 2020-04-01 LAB — TROPONIN I (HIGH SENSITIVITY): Troponin I (High Sensitivity): 14 ng/L (ref <18)

## 2020-04-01 MED ORDER — SODIUM CHLORIDE 0.9 % IV BOLUS
500.0000 mL | Freq: Once | INTRAVENOUS | Status: AC
  Administered 2020-04-01: 500 mL via INTRAVENOUS

## 2020-04-01 MED ORDER — SODIUM CHLORIDE 0.9 % IV BOLUS
250.0000 mL | Freq: Once | INTRAVENOUS | Status: AC
  Administered 2020-04-01: 250 mL via INTRAVENOUS

## 2020-04-01 NOTE — ED Provider Notes (Addendum)
  Face-to-face evaluation   History: Presents for generalized weakness which occurred when she was driving to an appointment today.  She came here by private vehicle for evaluation.  She has ongoing difficulty with low blood pressure, and her cardiologist is lowering her blood pressure medicine.  She has also been having difficulty controlling sugars, they stay high even though she tries to watch her carbohydrate intake.  She drinks a lot of water every day.  She is also on a diuretic.  Physical exam: Frail elderly female.  She is alert and cooperative.  No dysarthria or aphasia.  Normal strength arms legs bilaterally.  Extremities without edema.  Abdomen soft nontender.  10:25 PM-urinalysis and clinical exam not consistent with UTI.  Medical screening examination/treatment/procedure(s) were conducted as a shared visit with non-physician practitioner(s) and myself.  I personally evaluated the patient during the encounter    Daleen Bo, MD 04/01/20 [id number]    Daleen Bo, MD 04/01/20 [phone]

## 2020-04-01 NOTE — ED Triage Notes (Signed)
Pt arrives to ED w/ n/v and weakness. Pt cbg 500 this morning.

## 2020-04-01 NOTE — ED Provider Notes (Signed)
[place] EMERGENCY DEPARTMENT Provider Note   CSN: [id number] Arrival date & time: 04/01/20  1353     History Chief Complaint  Patient presents with  . Weakness    Marisa Forbes is a 69 y.o. female.  HPI   Patient is a 69 year old female with a history of CVA, breast cancer, CHF, STEMI, diabetes, GERD, hypertension, hyperlipidemia, who presents to the emergency department today for evaluation of an episode of lightheadedness.  Patient was out running errands with her husband when she suddenly felt lightheaded and very weak.  She also had an episode of vomiting.  She denies any abdominal pain, chest pain, shortness of breath.  Denies any headaches, vertiginous symptoms.  Denies any visual changes or unilateral numbness/weakness.  She denies any recent fevers, diarrhea, urinary symptoms.  Past Medical History:  Diagnosis Date  . Arthritis    back  . Balance problems    due to stroke  . Benign head tremor    per pt due to muscle spasm in neck   . Breast cancer ([place]) 12/03/12   a. 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 S/p chemo with CMF and radiation.  . Cataracts, bilateral   . CHF (congestive heart failure) ([place])    On Monday she said her cardiologist was concerned for possible CHF, but is now improving-per pt.(07/26/15)  . Coronary artery disease    a. STEMI 08/2012: s/p DES to LAD, PTCA to downstream LAD. b. Relook cath same hospitalization: stable post-PCI anatomy with Stable Diag lesions (unlikely cause of resting angina, not good PCI targets), stable RCA lesion (non flow limiting).  . Diabetes mellitus without complication ([place])    Takes insulin and metformin  . Dysplasia of cervix, low grade (CIN 1)    in 02 s/p LEEP   . GERD (gastroesophageal reflux disease)    food related  . History of cancer chemotherapy    last in August 2014  . History of echocardiogram    Echo 2/19: Mild concentric LVH, moderate focal basal  septal hypertrophy, EF 45-50, apical akinesis (?apical pseudoaneurysm), anteroseptal akinesis, grade 1 diastolic dysfunction, MAC  . History of radiation therapy 06/23/2013-08/08/2013   62.4 gray to left breast  . HTN (hypertension)   . Hypercholesteremia   . Insomnia   . Left kidney mass   . LV dysfunction    a. EF 45-50% at time of STEMI 08/2012. b. Echo 04/2013: EF 45-50%, mild focal basal hypertrophy of septum, grade 1 d/d.  Marland Kitchen Myocardial infarction ([place]) 09/07/12  . Peripheral vision loss    left  . Perirectal abscess    11/2018   . Pleural effusion, bilateral   . Pneumonia   . Renal artery stenosis ([facility])    a. 20% L RAS in 08/2012 by cath. b. Duplex 07/2012: >60% L RAS.  . Stroke, embolic ([place])    a. 41/7408 post cath. with residual reduced vision in left eye; noted right occiptal lobe/PCA infarct   . UTI (urinary tract infection)   . UTI (urinary tract infection)   . Vasovagal syncope    a. 04/2013 - echo stable compared to prior EF 45-50%, negative carotid duplex.    Patient Active Problem List   Diagnosis Date Noted  . Adenomatous polyp of transverse colon   . Polyp of rectum   . Protein-calorie malnutrition, severe 02/24/2020  . Constipation   . Abnormal finding on GI tract imaging   . Pressure injury of skin 02/22/2020  .  Intractable vomiting with nausea 02/21/2020  . Impaction of colon ([place])   . Hypotension 02/19/2020  . Near syncope 02/19/2020  . Nausea and vomiting 02/14/2020  . Hypertensive emergency 02/14/2020  . Hypertensive encephalopathy 02/14/2020  . Hyperglycemia due to type 2 diabetes mellitus ([place]) 02/14/2020  . Weakness 01/20/2020  . Cerebrovascular accident (CVA) ([place]) 01/20/2020  . Gait abnormality 01/20/2020  . Mobility impaired 01/07/2020  . Sinusitis 12/28/2019  . Decreased activities of daily living (ADL) 12/05/2019  . Physical deconditioning 12/05/2019  . Impaired instrumental activities of daily living (IADL) 12/05/2019  . Abnormal gait  12/05/2019  . Insomnia 12/25/2018  . Peri-rectal abscess 12/12/2018  . Perirectal abscess 12/11/2018  . Left renal mass 12/11/2018  . Pleural effusion 12/11/2018  . Lower urinary tract infectious disease   . Proctitis   . Unintentional weight loss   . Chronic combined systolic (congestive) and diastolic (congestive) heart failure ([place]) 01/22/2018  . Elevated troponin 12/22/2016  . Chest pain, rule out acute myocardial infarction 07/29/2015  . 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 Poorly controlled type 2 diabetes mellitus with circulatory disorder ([place])   . Ischemic cardiomyopathy   . Congestive dilated cardiomyopathy ([place]) 04/13/2015  . 3rd cranial nerve palsy   . Diplopia   . CVA (cerebral infarction) 04/11/2015  . Renal artery stenosis ([place])   . LV dysfunction   . Benign paroxysmal positional vertigo 05/19/2013  . Cancer of upper-inner quadrant of female breast ([place]) 12/06/2012  . Cervical dystonia 10/18/2012  . Peripheral neuropathy 10/18/2012  . Other screening mammogram 10/03/2012  . History of completed stroke 09/12/2012    Class: Acute  . Hyperlipidemia with target LDL less than 70 09/09/2012  . CAD (coronary artery disease) 09/09/2012  . NSVT, 5 beats 11/10 09/09/2012  . Hypertensive heart disease with CHF (congestive heart failure) ([place]) 09/07/2012    Past Surgical History:  Procedure Laterality Date  . BACK SURGERY     x3, lower back  . BIOPSY  02/20/2020   Procedure: BIOPSY;  Surgeon: Jackquline Denmark, MD;  Location: [facility] ENDOSCOPY;  Service: Endoscopy;;  . BREAST BIOPSY Left 12/03/2012   U/S Core- Malignant  . BREAST LUMPECTOMY Left 01/08/2013  . BREAST LUMPECTOMY WITH NEEDLE LOCALIZATION AND AXILLARY SENTINEL LYMPH NODE BX Left 01/08/2013   Procedure: LEFT BREAST WIRE GUIDED  LUMPECTOMY AND LEFT AXILLARY SENTINEL  NODE BX;  Surgeon: Rolm Bookbinder, MD;  Location: [facility];  Service: General;  Laterality: Left;  . CATARACT EXTRACTION Bilateral   . CERVICAL BIOPSY  W/  LOOP ELECTRODE EXCISION     02/2001  . COLONOSCOPY WITH PROPOFOL N/A 02/26/2020   Procedure: COLONOSCOPY WITH PROPOFOL;  Surgeon: Lavena Bullion, DO;  Location: [facility];  Service: Gastroenterology;  Laterality: N/A;  . CORONARY ANGIOPLASTY WITH STENT PLACEMENT    . ESOPHAGOGASTRODUODENOSCOPY (EGD) WITH PROPOFOL N/A 02/20/2020   Procedure: ESOPHAGOGASTRODUODENOSCOPY (EGD) WITH PROPOFOL;  Surgeon: Jackquline Denmark, MD;  Location[facility] END[facility]COPY;  Service: Endoscopy;  Laterality: N/A;  . EYE SURGERY     b/l cataract   . IMPACTION REMOVAL  02/20/2020   Procedure: IMPACTION REMOVAL;  Surgeon: Jackquline Denmark, MD;  Location: [facility] ENDOSCOPY;  Service: Endoscopy;;  Manual Stool Disimpaction  . INCISION AND DRAINAGE PERIRECTAL ABSCESS N/A 12/11/2018   Procedure: IRRIGATION AND DEBRIDEMENT PERIRECTAL ABSCESS;  Surgeon: Johnathan Hausen, MD;  Location: [facility] ORS;  Service: General;  Laterality: N/A;  . LEFT HEART CATHETERIZATION WITH CORONARY ANGIOGRAM N/A 09/07/2012   Procedure: LEFT HEART CATHETERIZATION WITH CORONARY ANGIOGRAM;  Surgeon: Troy Sine, MD;  Location: [facility] CATH LAB;  Service: Cardiovascular;  Laterality: N/A;  . LEFT HEART CATHETERIZATION WITH CORONARY ANGIOGRAM  09/09/2012   Procedure: LEFT HEART CATHETERIZATION WITH CORONARY ANGIOGRAM;  Surgeon: Leonie Man, MD;  Location: [facility] CATH LAB;  Service: Cardiovascular;;  . PERCUTANEOUS CORONARY STENT INTERVENTION (PCI-S) N/A 09/07/2012   Procedure: PERCUTANEOUS CORONARY STENT INTERVENTION (PCI-S);  Surgeon: Troy Sine, MD;  Location: [facility] CATH LAB;  Service: Cardiovascular;  Laterality: N/A;  . POLYPECTOMY  02/26/2020   Procedure: POLYPECTOMY;  Surgeon: Lavena Bullion, DO;  Location: [facility] ENDOSCOPY;  Service: Gastroenterology;;  . PORT-A-CATH REMOVAL N/A 10/14/2013   Procedure: REMOVAL PORT-A-CATH;  Surgeon: Rolm Bookbinder, MD;  Location: [facility] ORS;  Service: General;  Laterality: N/A;  . PORTACATH PLACEMENT Right 02/17/2013   Procedure:  INSERTION PORT-A-CATH;  Surgeon: Rolm Bookbinder, MD;  Location: [facility];  Service: General;  Laterality: Right;  . THORACENTESIS     12/13/2018 atypical cells not definitely diag. of malignancy      OB History   No obstetric history on file.     Family History  Problem Relation Age of Onset  . COPD Mother   . Heart disease Father   . Hypertension Father   . Diabetes Brother   . Hypertension Brother   . Learning disabilities Brother   . Hypertension Brother   . Asthma Daughter   . Cancer Neg Hx   . Hyperlipidemia Neg Hx   . Kidney disease Neg Hx   . Stroke Neg Hx     Social History   Tobacco Use  . Smoking status: Never Smoker  . Smokeless tobacco: Never Used  Substance Use Topics  . Alcohol use: Not Currently  . Drug use: No    Home Medications Prior to Admission medications   Medication Sig Start Date End Date Taking? Authorizing Provider  aspirin 81 MG EC tablet Take 1 tablet (81 mg total) by mouth daily. 04/16/15  Yes Barton Dubois, MD  atorvastatin (LIPITOR) 80 MG tablet TAKE 1 TABLET BY MOUTH DAILY. Patient taking differently: Take 80 mg by mouth daily.  07/15/19  Yes Fay Records, MD  calcium carbonate (TUMS EX) 750 MG chewable tablet Chew 750 mg by mouth daily.    Yes [provider]  carvedilol (COREG) 6.25 MG tablet TAKE 1 TABLET BY MOUTH TWICE A DAY WITH MEALS Patient taking differently: Take 6.25 mg by mouth 2 (two) times daily with a meal.  03/30/20  Yes Fay Records, MD  ezetimibe (ZETIA) 10 MG tablet TAKE 1 TABLET BY MOUTH EVERY DAY Patient taking differently: Take 10 mg by mouth at bedtime.  07/15/19  Yes Fay Records, MD  feeding supplement, ENSURE ENLIVE, (ENSURE ENLIVE) LIQD Take 237 mLs by mouth 2 (two) times daily as needed (If PO intakes of meals are poor). Patient taking differently: Take 237 mLs by mouth every other day as needed (if meal intake is poor).  12/14/18  Yes Irene Pap N, DO  furosemide (LASIX) 40 MG tablet Take 2 tablets  (80 mg total) by mouth daily. Patient taking differently: Take 40 mg by mouth daily.  02/26/20  Yes Pahwani, Einar Grad, MD  insulin NPH Human (NOVOLIN N RELION) 100 UNIT/ML injection Inject 0.25 mLs (25 Units total) into the skin 2 (two) times daily before a meal. Patient taking differently: Inject 20 Units into the skin in the morning, at noon, and at bedtime.  02/26/20  Yes Darliss Cheney, MD  insulin  regular (NOVOLIN R RELION) 100 units/mL injection Inject 0.1-0.15 mLs (10-15 Units total) into the skin 3 (three) times daily before meals. Patient taking differently: Inject 20 Units into the skin 3 (three) times daily before meals.  11/04/19  Yes Philemon Kingdom, MD  isosorbide mononitrate (IMDUR) 60 MG 24 hr tablet TAKE 1 TABLET BY MOUTH DAILY. Patient taking differently: Take 60 mg by mouth daily.  07/14/19  Yes Fay Records, MD  lisinopril (ZESTRIL) 40 MG tablet Take 1 tablet (40 mg total) by mouth daily. 02/16/20  Yes Jennye Boroughs, MD  naproxen sodium (ALEVE) 220 MG tablet Take 440 mg by mouth daily as needed (pain/headache).   Yes [provider]  nitroGLYCERIN (NITROSTAT) 0.4 MG SL tablet Place 1 tablet (0.4 mg total) under the tongue every 5 (five) minutes as needed for chest pain. 04/26/18  Yes Fay Records, MD  ondansetron (ZOFRAN-ODT) 4 MG disintegrating tablet Take 1 tablet (4 mg total) by mouth every 8 (eight) hours as needed for nausea or vomiting. 02/24/20  Yes Pahwani, Einar Grad, MD  potassium chloride (KLOR-CON) 10 MEQ tablet Take 1 tablet (10 mEq total) by mouth daily. 02/04/20  Yes Fay Records, MD  spironolactone (ALDACTONE) 25 MG tablet Take 1 tablet (25 mg total) by mouth daily. 02/26/20  Yes Pahwani, Einar Grad, MD  Blood Glucose Monitoring Suppl (ONETOUCH VERIO) w/Device KIT Use to check blood sugar 4 times a day. 03/26/20   Philemon Kingdom, MD  glucose blood (ONETOUCH VERIO) test strip Use to check blood sugar 4 times a day. 03/26/20   Philemon Kingdom, MD  hydrALAZINE (APRESOLINE) 10  MG tablet Take 0.5 tablets (5 mg total) by mouth 2 (two) times daily as needed. For BP >130/>80 Patient taking differently: Take 10 mg by mouth 2 (two) times daily as needed (when blood presure is high). For BP >130/>80 03/30/20   Fay Records, MD  pantoprazole (PROTONIX) 40 MG tablet Take 1 tablet (40 mg total) by mouth 2 (two) times daily. For 8 weeks, then transition to once daily Patient not taking: Reported on 04/01/2020 02/20/20 04/20/20  Elodia Florence., MD    Allergies    Patient has no known allergies.  Review of Systems   Review of Systems  Constitutional: Negative for fever.  HENT: Negative for ear pain and sore throat.   Eyes: Negative for visual disturbance.  Respiratory: Negative for cough and shortness of breath.   Cardiovascular: Negative for chest pain.  Gastrointestinal: Positive for nausea and vomiting. Negative for abdominal pain, constipation and diarrhea.  Genitourinary: Negative for dysuria and hematuria.  Musculoskeletal: Negative for back pain.  Skin: Negative for color change and rash.  Neurological: Positive for weakness and light-headedness. Negative for dizziness, syncope, speech difficulty, numbness and headaches.  All other systems reviewed and are negative.   Physical Exam Updated Vital Signs BP 139/82   Pulse 79   Temp 97.7 F (36.5 C) (Oral)   Resp 15   Ht _0  (1.651 m)   Wt 49.4 kg   SpO2 100%   BMI 18.14 kg/m   Physical Exam Vitals and nursing note reviewed.  Constitutional:      General: She is not in acute distress.    Appearance: She is well-developed.  HENT:     Head: Normocephalic and atraumatic.     Mouth/Throat:     Mouth: Mucous membranes are dry.  Eyes:     Conjunctiva/sclera: Conjunctivae normal.  Cardiovascular:     Rate and Rhythm:  Normal rate and regular rhythm.     Pulses: Normal pulses.     Heart sounds: Normal heart sounds. No murmur.  Pulmonary:     Effort: Pulmonary effort is normal. No respiratory  distress.     Breath sounds: Normal breath sounds. No wheezing, rhonchi or rales.  Abdominal:     General: Bowel sounds are normal.     Palpations: Abdomen is soft.     Tenderness: There is no abdominal tenderness.  Musculoskeletal:     Cervical back: Neck supple.  Skin:    General: Skin is warm and dry.  Neurological:     Mental Status: She is alert.     Comments: Mental Status:  Alert, thought content appropriate, able to give a coherent history. Speech fluent without evidence of aphasia. Able to follow 2 step commands without difficulty.  Cranial Nerves:  II: pupils equal, round, reactive to light III,IV, VI: ptosis not present, extra-ocular motions intact bilaterally  V,VII: smile symmetric, facial light touch sensation equal VIII: hearing grossly normal to voice  X: uvula elevates symmetrically  XI: bilateral shoulder shrug symmetric and strong XII: midline tongue extension without fassiculations Motor:  Normal tone. 5/5 strength of BUE and BLE major muscle groups including strong and equal grip strength and dorsiflexion/plantar flexion Sensory: light touch normal in all extremities. Cerebellar: normal finger-to-nose with bilateral upper extremities       ED Results / Procedures / Treatments   Labs (all labs ordered are listed, but only abnormal results are displayed) Labs Reviewed  BASIC METABOLIC PANEL - Abnormal; Notable for the following components:      Result Value   Sodium 129 (*)    Chloride 91 (*)    Glucose, Bld 258 (*)    BUN 29 (*)    All other components within normal limits  HEPATIC FUNCTION PANEL - Abnormal; Notable for the following components:   Total Protein 6.2 (*)    Albumin 3.3 (*)    Indirect Bilirubin 1.0 (*)    All other components within normal limits  LIPASE, BLOOD - Abnormal; Notable for the following components:   Lipase 61 (*)    All other components within normal limits  CBG MONITORING, ED - Abnormal; Notable for the following  components:   Glucose-Capillary 266 (*)    All other components within normal limits  CBG MONITORING, ED - Abnormal; Notable for the following components:   Glucose-Capillary 385 (*)    All other components within normal limits  CBC  URINALYSIS, ROUTINE W REFLEX MICROSCOPIC  CBG MONITORING, ED  TROPONIN I (HIGH SENSITIVITY)    EKG None  Radiology DG Chest Portable 1 View  Result Date: 04/01/2020 CLINICAL DATA:  Nausea, vomiting, lightheaded EXAM: PORTABLE CHEST 1 VIEW COMPARISON:  02/14/2020 FINDINGS: The heart size and mediastinal contours are within normal limits. Both lungs are clear. The visualized skeletal structures are unremarkable. IMPRESSION: No active disease. Electronically Signed   By: Rolm Baptise M.D.   On: 04/01/2020 17:43    Procedures Procedures (including critical care time)  Medications Ordered in ED Medications  sodium chloride 0.9 % bolus 500 mL (500 mLs Intravenous New Bag/Given 04/01/20 1853)  sodium chloride 0.9 % bolus 250 mL (250 mLs Intravenous New Bag/Given 04/01/20 2201)    ED Course  I have reviewed the triage vital signs and the nursing notes.  Pertinent labs & imaging results that were available during my care of the patient were reviewed by me and considered in my medical  decision making (see chart for details).    MDM Rules/Calculators/A&P                      69 year old female presenting for evaluation of an episode of generalized weakness that occurred prior to arrival.  She also had a few episodes of vomiting.  Blood pressure was initially low on arrival.  It has improved since onset.  She has been following with her cardiologist who has been decreasing her blood pressure medications recently.  On my evaluation she is neuro intact.  Cardiac and pulmonary exams are benign. Ambulatory in the ed.  We will get labs, troponin, EKG and give small bolus of fluids CBC nonacute CMP with hyponatremia, sodium of 129, elevated blood glucose.  Elevated  BUN but normal creatinine and liver enzymes. Troponin negative Lipase is marginally elevated  EKG is unchanged from prior.   CXR with No active disease  Patient was somewhat orthostatic.  Her labs also suggest dehydration which is likely the etiology of her lightheadedness today.  She was given a small fluid bolus and following this is able to tolerate p.o.  Workup thus far is reassuring. At shift change, care transitioned to Dr. Eulis Foster to f/u on pending UA with likely discharge if negative.   Final Clinical Impression(s) / ED Diagnoses Final diagnoses:  Generalized weakness  Mild dehydration    Rx / DC Orders ED Discharge Orders    None       Bishop Dublin 04/01/20 2207    Daleen Bo, MD 04/01/20 [phone]

## 2020-04-01 NOTE — ED Notes (Signed)
Pt ambulated to br with a walker. Pt did well ambulating. Pt states she uses a walker at home and can be unsteady with the cane she also uses at home.

## 2020-04-01 NOTE — Discharge Instructions (Addendum)
You will need to hold your fluid pill over the weekend. Make sure to drink plenty of fluids.  Please follow up with your primary care provider within 3-5 days for re-evaluation of your symptoms. If you do not have a primary care provider, information for a healthcare clinic has been provided for you to make arrangements for follow up care. Please return to the emergency department for any new or worsening symptoms.   Call your cardiologist to discuss additional medication changes to help control appropriate blood pressure.

## 2020-04-03 DIAGNOSIS — K2900 Acute gastritis without bleeding: Secondary | ICD-10-CM | POA: Diagnosis not present

## 2020-04-03 DIAGNOSIS — I110 Hypertensive heart disease with heart failure: Secondary | ICD-10-CM | POA: Diagnosis not present

## 2020-04-03 DIAGNOSIS — E1159 Type 2 diabetes mellitus with other circulatory complications: Secondary | ICD-10-CM | POA: Diagnosis not present

## 2020-04-03 DIAGNOSIS — E1142 Type 2 diabetes mellitus with diabetic polyneuropathy: Secondary | ICD-10-CM | POA: Diagnosis not present

## 2020-04-03 DIAGNOSIS — E1165 Type 2 diabetes mellitus with hyperglycemia: Secondary | ICD-10-CM | POA: Diagnosis not present

## 2020-04-03 DIAGNOSIS — I5042 Chronic combined systolic (congestive) and diastolic (congestive) heart failure: Secondary | ICD-10-CM | POA: Diagnosis not present

## 2020-04-05 ENCOUNTER — Telehealth: Payer: Self-pay

## 2020-04-05 ENCOUNTER — Telehealth: Payer: Self-pay | Admitting: Internal Medicine

## 2020-04-05 DIAGNOSIS — I110 Hypertensive heart disease with heart failure: Secondary | ICD-10-CM | POA: Diagnosis not present

## 2020-04-05 DIAGNOSIS — E1142 Type 2 diabetes mellitus with diabetic polyneuropathy: Secondary | ICD-10-CM | POA: Diagnosis not present

## 2020-04-05 DIAGNOSIS — E1165 Type 2 diabetes mellitus with hyperglycemia: Secondary | ICD-10-CM | POA: Diagnosis not present

## 2020-04-05 DIAGNOSIS — K2900 Acute gastritis without bleeding: Secondary | ICD-10-CM | POA: Diagnosis not present

## 2020-04-05 DIAGNOSIS — I5042 Chronic combined systolic (congestive) and diastolic (congestive) heart failure: Secondary | ICD-10-CM | POA: Diagnosis not present

## 2020-04-05 DIAGNOSIS — E1159 Type 2 diabetes mellitus with other circulatory complications: Secondary | ICD-10-CM | POA: Diagnosis not present

## 2020-04-05 NOTE — Telephone Encounter (Signed)
Nurse from [facility] called in stating that pt need order for 9 weeks of nursing and to talk about hospice visit.

## 2020-04-05 NOTE — Telephone Encounter (Signed)
Okay for orders? 

## 2020-04-05 NOTE — Telephone Encounter (Signed)
Hospice Nurse Sula Soda from [facility] care called left her call back number [phone]

## 2020-04-05 NOTE — Telephone Encounter (Signed)
[place] for verbal for this  [facility]

## 2020-04-05 NOTE — Telephone Encounter (Signed)
Telephone call made to patients husband [name] to schedule palliative care visit. [name] reports patient receiving nursing, PT and OT through [facility] currently. Husband states home health nurse has requested a hospice referral for patient. Husband states he feels there is no need for RN to make home visit. Nurse informed husband to call with concerns and nurse will check with referral intake to see if hospice referral has been made for patient.

## 2020-04-06 ENCOUNTER — Telehealth: Payer: Self-pay | Admitting: Internal Medicine

## 2020-04-06 NOTE — Telephone Encounter (Signed)
Spoke to Nurse Sula Soda and gave Dr. Audrie Gallus verbal OK.

## 2020-04-12 ENCOUNTER — Telehealth: Payer: Self-pay | Admitting: Internal Medicine

## 2020-04-12 NOTE — Telephone Encounter (Signed)
Rejection Reason - Patient Declined - Patient's spouse declined palliative services due to a hospice referral being made for the patient. " Manufacturing engineer (Palliative) said 6 days ago

## 2020-04-12 NOTE — Telephone Encounter (Signed)
Can we check the status of this? Hospice referral    Marisa Forbes

## 2020-04-12 NOTE — Telephone Encounter (Signed)
Error

## 2020-04-12 NOTE — Telephone Encounter (Signed)
Faxed Hospice certification and plan of care for 04-07-20 to 07-05-20 to community home [facility] and hospice [place], [place])-04-12-2020

## 2020-04-12 NOTE — Telephone Encounter (Signed)
Faxed order for 04-06-2020 for hospice information visit to [facility] Care/04-12-2020

## 2020-04-13 ENCOUNTER — Telehealth: Payer: Self-pay | Admitting: Internal Medicine

## 2020-04-13 NOTE — Telephone Encounter (Signed)
Faxed order for 04-09-2020 for hospice report.04-13-2020

## 2020-04-14 ENCOUNTER — Ambulatory Visit: Payer: Medicare Other | Admitting: Internal Medicine

## 2020-04-16 ENCOUNTER — Telehealth: Payer: Self-pay | Admitting: Internal Medicine

## 2020-04-19 NOTE — Telephone Encounter (Signed)
We will take care of it.  Thanks for the information .  Go relax

## 2020-04-21 ENCOUNTER — Telehealth: Payer: Self-pay | Admitting: Internal Medicine

## 2020-04-21 NOTE — Telephone Encounter (Signed)
Patient's death Certificate has been dropped off to be signed. Death Certificate is located in Dr. Claris Gladden color folder up front.

## 2020-04-21 NOTE — Telephone Encounter (Signed)
Would you be able to sign this for DR Olivia Mackie McLean-Scocuzza?

## 2020-04-21 NOTE — Telephone Encounter (Signed)
Completed and returned to [place].  Copy made for [place]

## 2020-04-21 NOTE — Telephone Encounter (Signed)
Handed to Dr. Tullo 

## 2020-04-21 NOTE — Telephone Encounter (Signed)
Spoke with April at [facility] and faxed a copy to them per her request, also placed the original up front for pick up and put a copy in scan.

## 2020-04-21 NOTE — Telephone Encounter (Signed)
[facility] called and needs death certificate signed today. Gave to [place] to give to Dr. Derrel Nip to sign. Please fax a copy so they can have her ashes ready for tomorrow's service. They will also pick it up when it is ready. She said the number to fax it to them is on the card with death certificate.

## 2020-04-22 NOTE — Telephone Encounter (Signed)
Pt has passed. 

## 2020-04-29 NOTE — Telephone Encounter (Signed)
Faxed order for 03-02-20 order type add on discipline 2020-04-29.

## 2020-04-29 NOTE — Telephone Encounter (Signed)
For your information, they will send the death certificate here.

## 2020-04-29 NOTE — Telephone Encounter (Signed)
[facility] called to report patient's death.

## 2020-04-29 NOTE — Telephone Encounter (Signed)
Need copy of death certificate before I leave 6/21   [place]

## 2020-04-29 NOTE — Telephone Encounter (Signed)
Hi all  Sadly my chronically ill uncontrolled diabetic patient died today. She also had CHF, HTN was noncompliant with meds and health decline over months on palliative care and just transitioned to hospice she died 10:29 am 2020/04/29 I am not sure of details of death but I am her PCP and agreeable to fill out death certificate   Only issue is I am out of the office 04/19/20 they will need this filled out for the funeral home this week and I hate to ask one of you whose day this may or may not fall on to fill this out next week but please not to delay funeral arrangement   Cause of death  Atherosclerotic cardiovascular disease  Congestive heart failure  Uncontrolled diabetes 2 Essential hypertension   No autopsy done  Thank you [place]

## 2020-04-29 NOTE — Telephone Encounter (Signed)
[facility] said patient time of death was 10:29am on 2020/04/22

## 2020-04-29 DEATH — deceased

## 2020-05-24 ENCOUNTER — Ambulatory Visit: Payer: Medicare Other | Admitting: Neurology

## 2020-07-21 ENCOUNTER — Ambulatory Visit: Payer: Medicare Other

## 2021-02-17 IMAGING — MG DIGITAL DIAGNOSTIC BILATERAL MAMMOGRAM WITH TOMO AND CAD
9 series · 9 of 25 positions shown · non-contrast
Comparison: Previous exam(s).

CLINICAL DATA: 68-year-old female for annual bilateral mammogram
follow-up. History of LEFT breast cancer and lumpectomy in 9774.

EXAM:
DIGITAL DIAGNOSTIC BILATERAL MAMMOGRAM WITH CAD AND TOMO

[L CC]
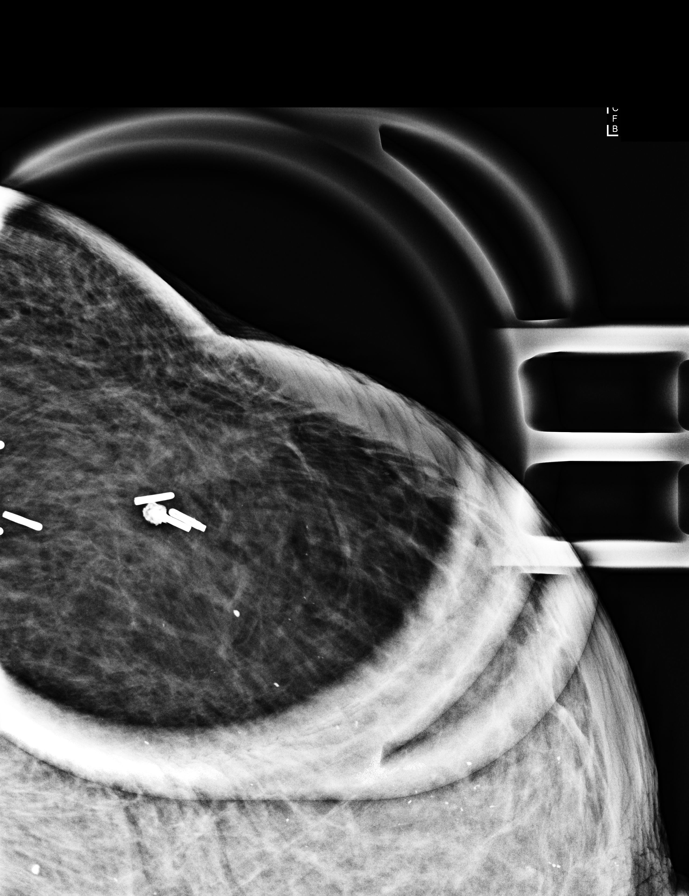

[L MLO synth-2D]
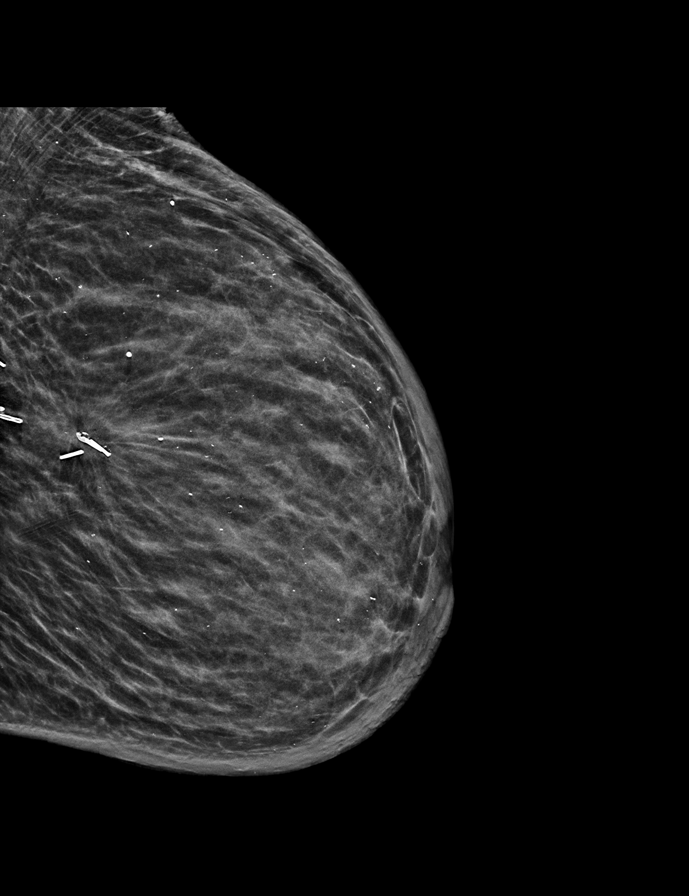

[L CC synth-2D]
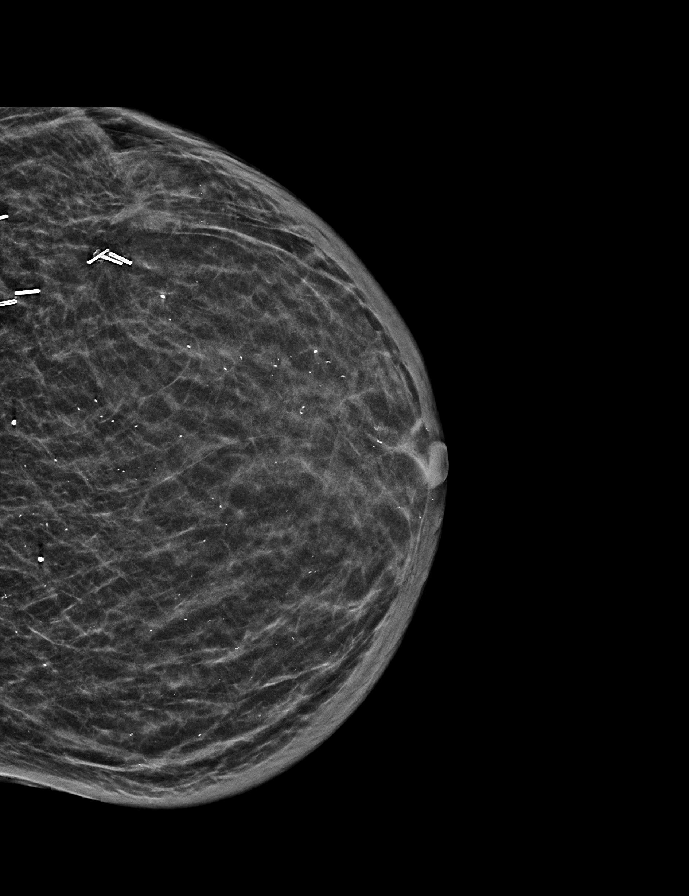

[R MLO synth-2D]
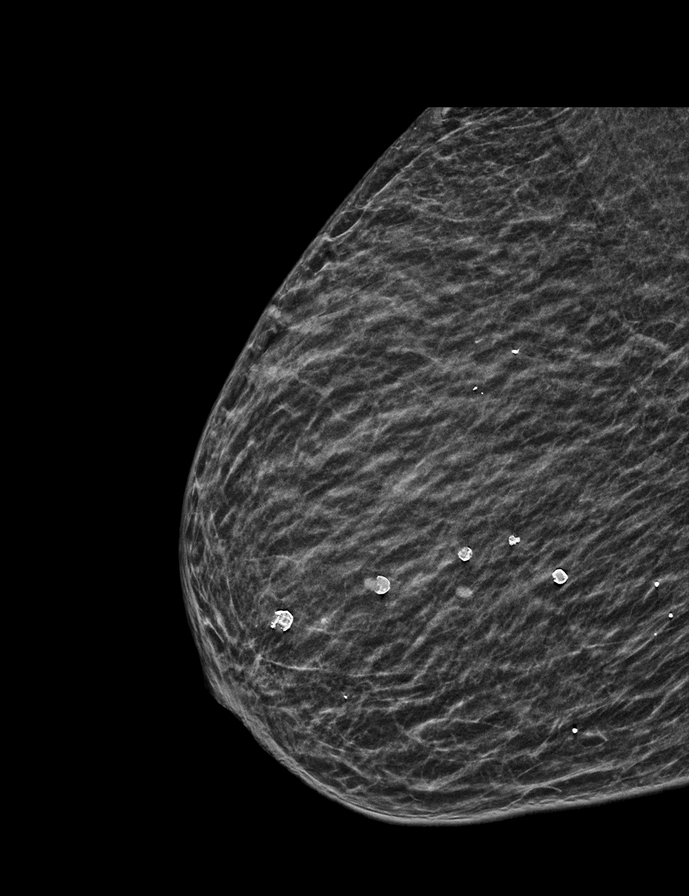

[R CC synth-2D]
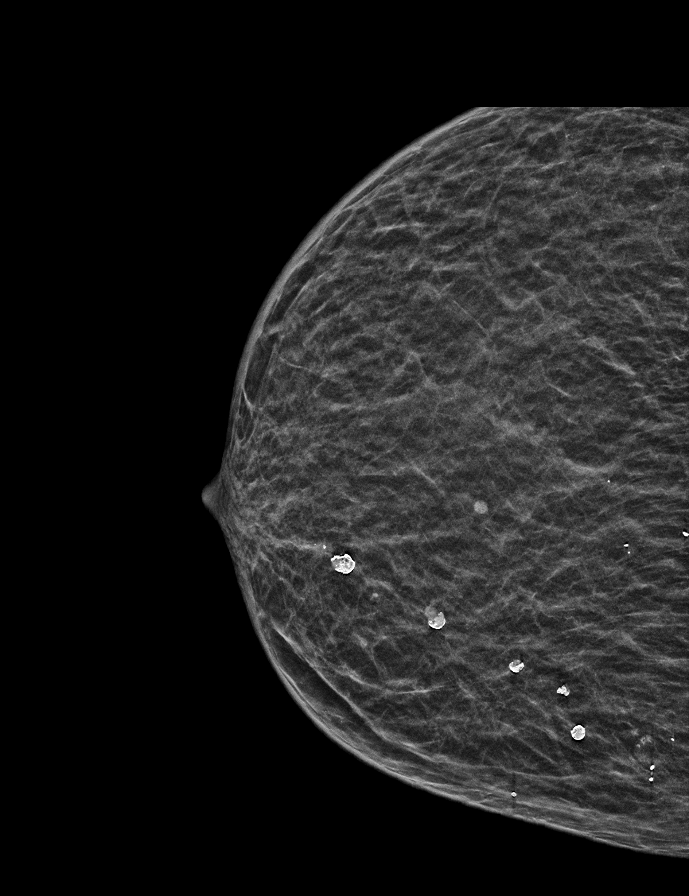

[L CC tomo · tomo slice 20/39.0]
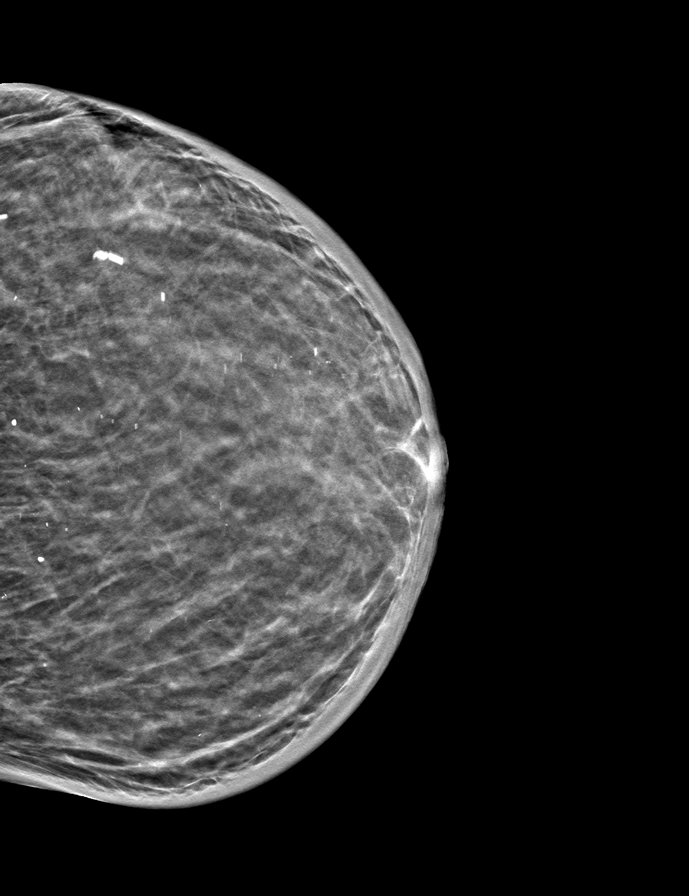

[R MLO tomo · tomo slice 15/30.0]
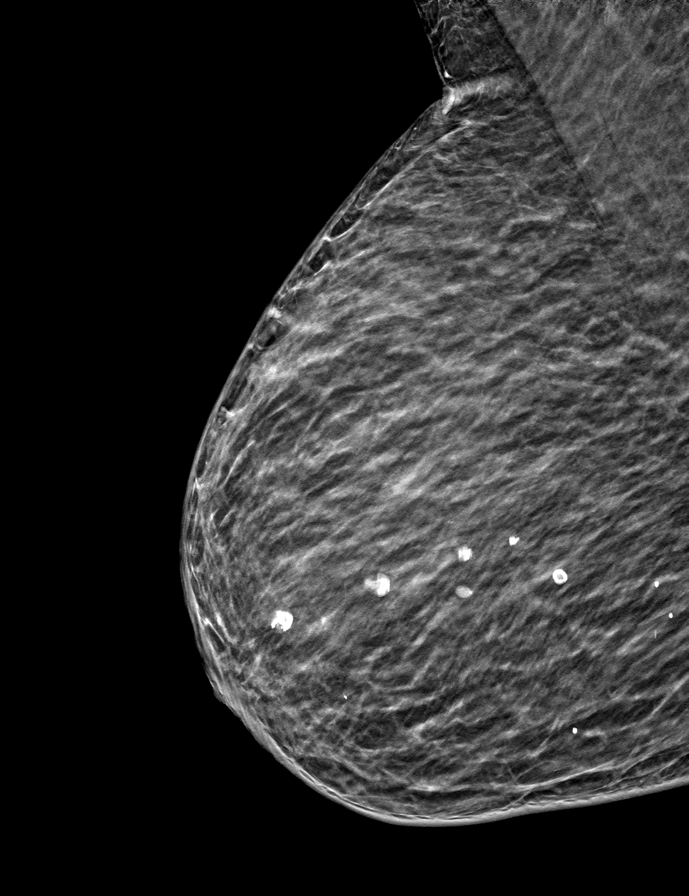

[R CC tomo · tomo slice 14/27.0]
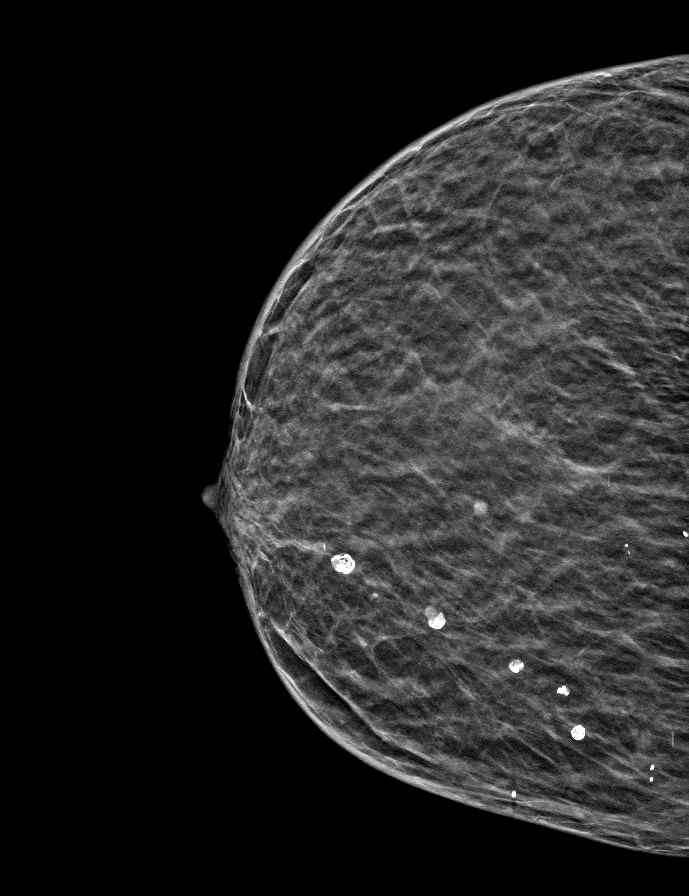

[L MLO tomo · tomo slice 24/47.0]
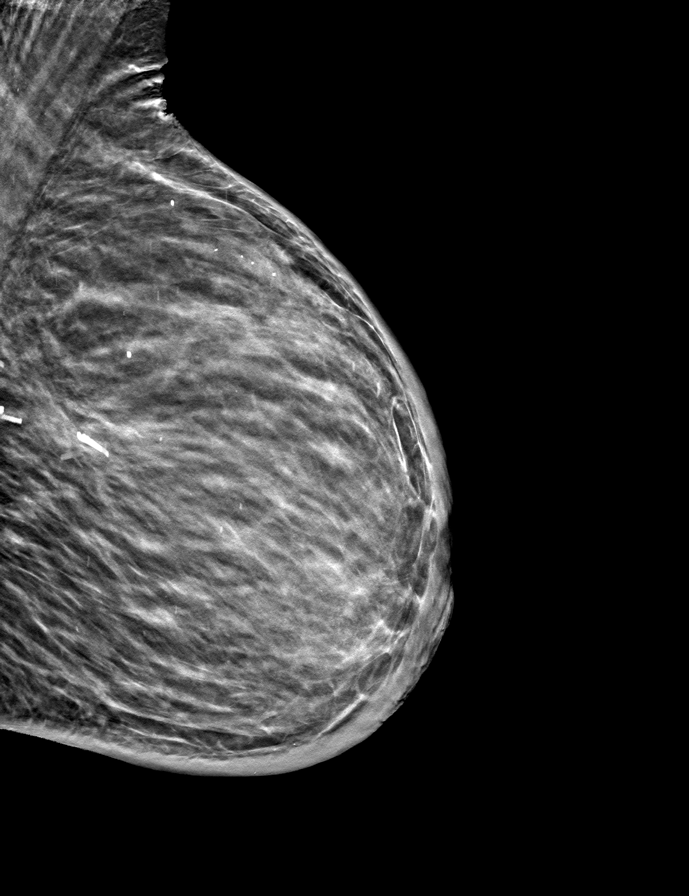

[9 of 25 positions shown; findings below may reference images not displayed]

ACR Breast Density Category c: The breast tissue is heterogeneously
dense, which may obscure small masses.
FINDINGS: 2D and 3D full field views of both breasts and a magnification view
of the lumpectomy site demonstrate no suspicious mass, nonsurgical
distortion or worrisome calcifications.

LEFT lumpectomy and treatment changes again noted.

Mammographic images were processed with CAD.
IMPRESSION: No evidence of breast malignancy

RECOMMENDATION:
Bilateral screening mammogram in 1 year

I have discussed the findings and recommendations with the patient.
If applicable, a reminder letter will be sent to the patient
regarding the next appointment.

BI-RADS CATEGORY  2: Benign.
